# Patient Record
Sex: Female | Born: 1945 | Race: White | Hispanic: No | Marital: Married | Smoking: Never smoker
Health system: Southern US, Community
[De-identification: ages and names within clinical notes are randomized; demographics above are authoritative.]

## PROBLEM LIST (undated history)

## (undated) DIAGNOSIS — I959 Hypotension, unspecified: Secondary | ICD-10-CM

## (undated) DIAGNOSIS — R911 Solitary pulmonary nodule: Secondary | ICD-10-CM

## (undated) DIAGNOSIS — M810 Age-related osteoporosis without current pathological fracture: Secondary | ICD-10-CM

## (undated) DIAGNOSIS — M1990 Unspecified osteoarthritis, unspecified site: Secondary | ICD-10-CM

## (undated) DIAGNOSIS — I219 Acute myocardial infarction, unspecified: Secondary | ICD-10-CM

## (undated) DIAGNOSIS — E785 Hyperlipidemia, unspecified: Secondary | ICD-10-CM

## (undated) DIAGNOSIS — G4730 Sleep apnea, unspecified: Secondary | ICD-10-CM

## (undated) DIAGNOSIS — K219 Gastro-esophageal reflux disease without esophagitis: Secondary | ICD-10-CM

## (undated) DIAGNOSIS — F419 Anxiety disorder, unspecified: Secondary | ICD-10-CM

## (undated) DIAGNOSIS — I480 Paroxysmal atrial fibrillation: Secondary | ICD-10-CM

## (undated) DIAGNOSIS — G4733 Obstructive sleep apnea (adult) (pediatric): Secondary | ICD-10-CM

## (undated) DIAGNOSIS — D126 Benign neoplasm of colon, unspecified: Secondary | ICD-10-CM

## (undated) DIAGNOSIS — K760 Fatty (change of) liver, not elsewhere classified: Secondary | ICD-10-CM

## (undated) DIAGNOSIS — R001 Bradycardia, unspecified: Secondary | ICD-10-CM

## (undated) DIAGNOSIS — E876 Hypokalemia: Secondary | ICD-10-CM

## (undated) DIAGNOSIS — I10 Essential (primary) hypertension: Secondary | ICD-10-CM

## (undated) DIAGNOSIS — Z9989 Dependence on other enabling machines and devices: Secondary | ICD-10-CM

## (undated) HISTORY — PX: ROTATOR CUFF REPAIR: SHX139

## (undated) HISTORY — PX: EYE SURGERY: SHX253

## (undated) HISTORY — DX: Fatty (change of) liver, not elsewhere classified: K76.0

## (undated) HISTORY — DX: Anxiety disorder, unspecified: F41.9

## (undated) HISTORY — DX: Dependence on other enabling machines and devices: Z99.89

## (undated) HISTORY — DX: Essential (primary) hypertension: I10

## (undated) HISTORY — DX: Hyperlipidemia, unspecified: E78.5

## (undated) HISTORY — DX: Atherosclerotic heart disease of native coronary artery without angina pectoris: I25.10

## (undated) HISTORY — DX: Gastro-esophageal reflux disease without esophagitis: K21.9

## (undated) HISTORY — DX: Age-related osteoporosis without current pathological fracture: M81.0

## (undated) HISTORY — PX: COSMETIC SURGERY: SHX468

## (undated) HISTORY — PX: TONSILLECTOMY: SUR1361

## (undated) HISTORY — PX: TOTAL ABDOMINAL HYSTERECTOMY: SHX209

## (undated) HISTORY — DX: Benign neoplasm of colon, unspecified: D12.6

## (undated) HISTORY — DX: Obstructive sleep apnea (adult) (pediatric): G47.33

## (undated) HISTORY — DX: Sleep apnea, unspecified: G47.30

---

## 1998-06-05 HISTORY — PX: CORONARY STENT PLACEMENT: SHX1402

## 1998-07-25 ENCOUNTER — Emergency Department (HOSPITAL_COMMUNITY): Admission: EM | Admit: 1998-07-25 | Discharge: 1998-07-25 | Payer: Self-pay | Admitting: Internal Medicine

## 1998-07-25 ENCOUNTER — Encounter: Payer: Self-pay | Admitting: Internal Medicine

## 1998-07-25 ENCOUNTER — Inpatient Hospital Stay (HOSPITAL_COMMUNITY): Admission: EM | Admit: 1998-07-25 | Discharge: 1998-07-28 | Payer: Self-pay | Admitting: Cardiovascular Disease

## 1998-08-04 ENCOUNTER — Inpatient Hospital Stay (HOSPITAL_COMMUNITY): Admission: EM | Admit: 1998-08-04 | Discharge: 1998-08-05 | Payer: Self-pay | Admitting: Emergency Medicine

## 1998-08-04 ENCOUNTER — Encounter: Payer: Self-pay | Admitting: *Deleted

## 1998-08-14 ENCOUNTER — Encounter: Payer: Self-pay | Admitting: Emergency Medicine

## 1998-08-14 ENCOUNTER — Observation Stay (HOSPITAL_COMMUNITY): Admission: EM | Admit: 1998-08-14 | Discharge: 1998-08-15 | Payer: Self-pay | Admitting: Emergency Medicine

## 1998-08-15 ENCOUNTER — Encounter: Payer: Self-pay | Admitting: General Surgery

## 1998-11-22 ENCOUNTER — Other Ambulatory Visit: Admission: RE | Admit: 1998-11-22 | Discharge: 1998-11-22 | Payer: Self-pay | Admitting: Obstetrics and Gynecology

## 1999-05-22 ENCOUNTER — Encounter: Payer: Self-pay | Admitting: Emergency Medicine

## 1999-05-22 ENCOUNTER — Inpatient Hospital Stay (HOSPITAL_COMMUNITY): Admission: EM | Admit: 1999-05-22 | Discharge: 1999-05-23 | Payer: Self-pay | Admitting: Emergency Medicine

## 1999-05-23 ENCOUNTER — Encounter: Payer: Self-pay | Admitting: Cardiovascular Disease

## 1999-10-18 ENCOUNTER — Other Ambulatory Visit: Admission: RE | Admit: 1999-10-18 | Discharge: 1999-10-18 | Payer: Self-pay | Admitting: Obstetrics and Gynecology

## 2000-03-18 ENCOUNTER — Emergency Department (HOSPITAL_COMMUNITY): Admission: EM | Admit: 2000-03-18 | Discharge: 2000-03-18 | Payer: Self-pay | Admitting: *Deleted

## 2001-03-20 ENCOUNTER — Other Ambulatory Visit: Admission: RE | Admit: 2001-03-20 | Discharge: 2001-03-20 | Payer: Self-pay | Admitting: Obstetrics and Gynecology

## 2001-07-03 ENCOUNTER — Encounter: Payer: Self-pay | Admitting: Emergency Medicine

## 2001-07-03 ENCOUNTER — Observation Stay (HOSPITAL_COMMUNITY): Admission: EM | Admit: 2001-07-03 | Discharge: 2001-07-03 | Payer: Self-pay | Admitting: Emergency Medicine

## 2001-12-22 ENCOUNTER — Encounter: Payer: Self-pay | Admitting: *Deleted

## 2001-12-23 ENCOUNTER — Inpatient Hospital Stay (HOSPITAL_COMMUNITY): Admission: EM | Admit: 2001-12-23 | Discharge: 2001-12-24 | Payer: Self-pay | Admitting: Emergency Medicine

## 2001-12-23 ENCOUNTER — Encounter: Payer: Self-pay | Admitting: Family Medicine

## 2001-12-27 ENCOUNTER — Emergency Department (HOSPITAL_COMMUNITY): Admission: EM | Admit: 2001-12-27 | Discharge: 2001-12-27 | Payer: Self-pay | Admitting: Emergency Medicine

## 2005-06-05 DIAGNOSIS — D126 Benign neoplasm of colon, unspecified: Secondary | ICD-10-CM

## 2005-06-05 HISTORY — DX: Benign neoplasm of colon, unspecified: D12.6

## 2005-07-26 ENCOUNTER — Emergency Department (HOSPITAL_COMMUNITY): Admission: EM | Admit: 2005-07-26 | Discharge: 2005-07-26 | Payer: Self-pay | Admitting: Emergency Medicine

## 2006-01-25 ENCOUNTER — Ambulatory Visit: Payer: Self-pay | Admitting: Gastroenterology

## 2006-02-07 ENCOUNTER — Ambulatory Visit: Payer: Self-pay | Admitting: Gastroenterology

## 2006-02-07 ENCOUNTER — Encounter (INDEPENDENT_AMBULATORY_CARE_PROVIDER_SITE_OTHER): Payer: Self-pay | Admitting: *Deleted

## 2007-11-26 ENCOUNTER — Observation Stay (HOSPITAL_COMMUNITY): Admission: RE | Admit: 2007-11-26 | Discharge: 2007-11-27 | Payer: Self-pay | Admitting: Orthopedic Surgery

## 2008-10-17 ENCOUNTER — Emergency Department (HOSPITAL_COMMUNITY): Admission: EM | Admit: 2008-10-17 | Discharge: 2008-10-17 | Payer: Self-pay | Admitting: Emergency Medicine

## 2008-11-11 ENCOUNTER — Encounter: Admission: RE | Admit: 2008-11-11 | Discharge: 2008-11-11 | Payer: Self-pay | Admitting: Family Medicine

## 2009-12-27 ENCOUNTER — Encounter: Payer: Self-pay | Admitting: Internal Medicine

## 2010-02-02 ENCOUNTER — Encounter: Payer: Self-pay | Admitting: Internal Medicine

## 2010-02-02 ENCOUNTER — Ambulatory Visit (HOSPITAL_BASED_OUTPATIENT_CLINIC_OR_DEPARTMENT_OTHER): Admission: RE | Admit: 2010-02-02 | Discharge: 2010-02-02 | Payer: Self-pay | Admitting: Cardiovascular Disease

## 2010-02-06 ENCOUNTER — Ambulatory Visit: Payer: Self-pay | Admitting: Internal Medicine

## 2010-03-17 ENCOUNTER — Ambulatory Visit: Payer: Self-pay | Admitting: Internal Medicine

## 2010-03-17 DIAGNOSIS — G4733 Obstructive sleep apnea (adult) (pediatric): Secondary | ICD-10-CM

## 2010-03-19 DIAGNOSIS — I10 Essential (primary) hypertension: Secondary | ICD-10-CM | POA: Insufficient documentation

## 2010-03-19 DIAGNOSIS — E785 Hyperlipidemia, unspecified: Secondary | ICD-10-CM | POA: Insufficient documentation

## 2010-04-07 ENCOUNTER — Ambulatory Visit: Payer: Self-pay | Admitting: Cardiovascular Disease

## 2010-04-13 ENCOUNTER — Ambulatory Visit: Payer: Self-pay | Admitting: Internal Medicine

## 2010-04-13 DIAGNOSIS — G4700 Insomnia, unspecified: Secondary | ICD-10-CM

## 2010-04-24 ENCOUNTER — Encounter: Payer: Self-pay | Admitting: Internal Medicine

## 2010-05-10 ENCOUNTER — Encounter: Payer: Self-pay | Admitting: Internal Medicine

## 2010-05-10 ENCOUNTER — Ambulatory Visit: Payer: Self-pay | Admitting: Cardiovascular Disease

## 2010-06-15 ENCOUNTER — Ambulatory Visit
Admission: RE | Admit: 2010-06-15 | Discharge: 2010-06-15 | Payer: Self-pay | Source: Home / Self Care | Attending: Internal Medicine | Admitting: Internal Medicine

## 2010-06-29 ENCOUNTER — Encounter: Payer: Self-pay | Admitting: Internal Medicine

## 2010-07-07 NOTE — Assessment & Plan Note (Signed)
Summary: 1 month f/u / cj   Primary Adrina Armijo/Referring Domonic Kimball:  Herb Grays, MD  CC:  1 mth rov - pt wearing cpap 5-6 hrs/might - nasal pillows work okay - snoring has improved - c/o incrased bloating.  History of Present Illness: March 17, 2010- 64 yoF referred courtesy of Dr Elease Hashimoto because of sleep apnea. she has been aware of loud snoring with onset after menopause. Some daytime somnolence and has had to be careful on long drives. Husband is in separate room so he can sleep.  Bedtime 9-10PM, latency 30-60 minutes, wakes several times during the night, sometimes with a gsp, someties for bathroom, finally up at 630-7AM. Weight stable.  NPSG 02/02/10- Moderate obstructive apnea, AHI 16.7/hr. CPAP was titrated to 7cwp for AHI 0/hr.  Hx tonsillectomy, HTN. No hx thyroid or cardiopulmonary problem.  April 13, 2010- [facility] Nurse-CC: 1 mth rov - pt wearing cpap 5-6 hrs/might - nasal pillows work okay - snoring has improved - c/o incrased bloating Husband assures her she isn't snoring w/ cpap, but she doesn't like it. Finds CPAP hose restrictive. It wakes her alot.  CPAP 7 Advanced. Had flu vax. Doesn't like the nasal pillows in her nose, but thinks she prefers over other designs. Prone to insomnia. Discussed sleep aides- did use benadryl in past. Getting gas.     Preventive Screening-Counseling & Management  Alcohol-Tobacco     Smoking Status: never  Current Medications (verified): 1)  Atenolol 50 Mg Tabs (Atenolol) .... Take 1 By Mouth Once Daily 2)  Norvasc 5 Mg Tabs (Amlodipine Besylate) .... Take 1 By Mouth Once Daily 3)  Aspirin 81 Mg Tbec (Aspirin) .... Take 2 By Mouth Once Daily  Allergies (verified): No Known Drug Allergies  Past History:  Past Medical History: Last updated: 03/17/2010 Obstructive sleep apnea- NPSG 02/02/10- AHI 16.7/ hr (weight 139lbs) CPAP Palpitation Hyperlipidemia Hypertension  Past Surgical History: Last updated: 03/17/2010 Total Abdominal  Hysterectomy Tonsillectomy  Family History: Last updated: 03/17/2010 cancer heart disease Father- died MI age 88, snored Mother- died age 9 Alzheimers  Social History: Last updated: 04/13/2010 Married; older children ETOH-2 glasses wine daily Retired, works part time in [employer] Never Smoked  Risk Factors: Alcohol Use: 2 (03/17/2010)  Risk Factors: Smoking Status: never (04/13/2010)  Social History: Married; older children ETOH-2 glasses wine daily Retired, works part time in [employer] Never Smoked  Review of Systems      See HPI  The patient denies anorexia, fever, weight loss, weight gain, vision loss, decreased hearing, hoarseness, chest pain, syncope, dyspnea on exertion, peripheral edema, prolonged cough, headaches, hemoptysis, abdominal pain, melena, hematochezia, and severe indigestion/heartburn.    Vital Signs:  Patient profile:   65 year old female Height:      60.5 inches Weight:      144.13 pounds O2 Sat:      98 % on Room air Pulse rate:   56 / minute BP sitting:   132 / 80  (left arm) Cuff size:   regular  Vitals Entered By: Abigail Miyamoto RN (April 13, 2010 10:44 AM)  O2 Flow:  Room air  Physical Exam  Additional Exam:  General: A/Ox3; pleasant and cooperative, NAD ,medium build SKIN: no rash, lesions NODES: no lymphadenopathy HEENT: [place]/AT, EOM- WNL, Conjuctivae- clear, PERRLA, TM-WNL, Nose- clear, Throat- clear and wnl, Mallampati  III NECK: Supple w/ fair ROM, JVD- none, normal carotid impulses w/o bruits Thyroid- normal to palpation CHEST: Clear to P&A HEART: RRR, no m/g/r  heard ABDOMEN:  JYN:WGNF, nl pulses, no edema  NEURO: Grossly intact to observation       Impression & Recommendations:  Problem # 1:  OBSTRUCTIVE SLEEP APNEA (ICD-327.23)  Several comfort and acceptance issues that need to be resolved if she is to be successfull with CPOAP. I will reduce pressure from 7 to 6 to reduce gas. She can try  simethicone. Try longer hose draped down center of bed to allow more mobility Try Rx temazepam 15 mg for sleep if needed.   Medications Added to Medication List This Visit: 1)  Simethicone 80 Mg Chew (Simethicone) .... Chew 1 four times a day as needed after meals 2)  Temazepam 15 Mg Caps (Temazepam) .Marland Kitchen.. 1-2 for sleep if needed, 1/2 hour before bed. 3)  Cpap 6 Advanced   Other Orders: Est. Patient Level IV (62130) DME Referral (DME)  Patient Instructions: 1)  Please schedule a follow-up appointment in 2 months. 2)  We will have Advanced reduce CPAP to 6. Hopefully this will reduce  bloating but still prevent significiant snoring.  3)  Try simthicone for bloating 4)  Script Temazepam sleep aid Prescriptions: TEMAZEPAM 15 MG CAPS (TEMAZEPAM) 1-2 for sleep if needed, 1/2 hour before bed.  #30 x 1   Entered and Authorized by:   Waymon Budge MD   Signed by:   Waymon Budge MD on 04/13/2010   Method used:   Print then Give to Patient   RxID:   [id number] SIMETHICONE 80 MG CHEW (SIMETHICONE) Chew 1 four times a day as needed after meals  #30 x prn   Entered and Authorized by:   Waymon Budge MD   Signed by:   Waymon Budge MD on 04/13/2010   Method used:   Print then Give to Patient   RxID:   [id number]    Influenza Immunization History:    Influenza # 1:  Historical (04/06/2010)

## 2010-07-07 NOTE — Letter (Signed)
Summary: [facility]   Imported By: Lennie Odor 03/28/2010 15:26:16  _____________________________________________________________________  External Attachment:    Type:   Image     Comment:   External Document

## 2010-07-07 NOTE — Letter (Signed)
[facility]: [facility]   Imported By: Lester [place] 05/02/2010 09:37:41  _____________________________________________________________________  External Attachment:    Type:   Image     Comment:   External Document

## 2010-07-07 NOTE — Assessment & Plan Note (Signed)
Summary: 2 month return/mhh   Primary Provider/Referring Provider:  Herb Grays, MD  CC:  3 month follow up pt has been on cpap x 3 months averages 6 hrs per night.  History of Present Illness: History of Present Illness: March 17, 2010- 65 yoF referred courtesy of Dr Elease Hashimoto because of sleep apnea. she has been aware of loud snoring with onset after menopause. Some daytime somnolence and has had to be careful on long drives. Husband is in separate room so he can sleep.  Bedtime 9-10PM, latency 30-60 minutes, wakes several times during the night, sometimes with a gsp, someties for bathroom, finally up at 630-7AM. Weight stable.  NPSG 02/02/10- Moderate obstructive apnea, AHI 16.7/hr. CPAP was titrated to 7cwp for AHI 0/hr.  Hx tonsillectomy, HTN. No hx thyroid or cardiopulmonary problem.  April 13, 2010- [facility] Nurse-CC: 1 mth rov - pt wearing cpap 5-6 hrs/might - nasal pillows work okay - snoring has improved - c/o incrased bloating Husband assures her she isn't snoring w/ cpap, but she doesn't like it. Finds CPAP hose restrictive. It wakes her alot.  CPAP 7 Advanced. Had flu vax. Doesn't like the nasal pillows in her nose, but thinks she prefers over other designs. Prone to insomnia. Discussed sleep aides- did use benadryl in past. Getting gas.   June 15, 2010- [facility] Nurse-CC: 3 month follow up pt has been on cpap x 3 months averages 6 hrs per night CPAP used all night every night. One night fell asleep w/o and woke gasping and racing heart, so she is convinced it is good for her.. It does stop her snore. The CPAP does fragment her sleep. She uses temazepam only occasionally - doesn't want to have to use it.     Preventive Screening-Counseling & Management  Alcohol-Tobacco     Alcohol drinks/day: 2     Alcohol type: wine     Smoking Status: never  Current Medications (verified): 1)  Atenolol 50 Mg Tabs (Atenolol) .... Take 1 By Mouth Once Daily 2)  Norvasc 5 Mg Tabs (Amlodipine  Besylate) .... Take 1 By Mouth Once Daily 3)  Aspirin 81 Mg Tbec (Aspirin) .... Take 2 By Mouth Once Daily 4)  Simethicone 80 Mg Chew (Simethicone) .... Chew 1 Four Times A Day As Needed After Meals 5)  Temazepam 15 Mg Caps (Temazepam) .Marland Kitchen.. 1-2 For Sleep If Needed, 1/2 Hour Before Bed. 6)  Cpap 6 Advanced  Allergies (verified): No Known Drug Allergies  Past History:  Past Medical History: Last updated: 03/17/2010 Obstructive sleep apnea- NPSG 02/02/10- AHI 16.7/ hr (weight 139lbs) CPAP Palpitation Hyperlipidemia Hypertension  Past Surgical History: Last updated: 03/17/2010 Total Abdominal Hysterectomy Tonsillectomy  Family History: Last updated: 03/17/2010 cancer heart disease Father- died MI age 67, snored Mother- died age 53 Alzheimers  Social History: Last updated: 04/13/2010 Married; older children ETOH-2 glasses wine daily Retired, works part time in [employer] Never Smoked  Risk Factors: Alcohol Use: 2 (06/15/2010)  Risk Factors: Smoking Status: never (06/15/2010)  Review of Systems      See HPI  The patient denies anorexia, fever, weight loss, weight gain, vision loss, decreased hearing, hoarseness, chest pain, syncope, dyspnea on exertion, peripheral edema, prolonged cough, headaches, hemoptysis, abdominal pain, severe indigestion/heartburn, abnormal bleeding, enlarged lymph nodes, and angioedema.    Vital Signs:  Patient profile:   65 year old female Height:      61 inches Weight:      145.6 pounds O2 Sat:  97 % on Room air Pulse rate:   61 / minute BP sitting:   130 / 78  (left arm)  Vitals Entered By: Renold Genta RCP, LPN (June 15, 2010 9:26 AM)  O2 Flow:  Room air CC: 3 month follow up pt has been on cpap x 3 months averages 6 hrs per night Comments Medications reviewed with patient Renold Genta RCP, LPN  June 15, 2010 9:27 AM    Physical Exam  Additional Exam:  General: A/Ox3; pleasant and cooperative, NAD  medium  build SKIN: no rash, lesions NODES: no lymphadenopathy HEENT: [place]/AT, EOM- WNL, Conjuctivae- clear, PERRLA, TM-WNL, Nose- clear, Throat- clear and wnl, Mallampati  III NECK: Supple w/ fair ROM, JVD- none, normal carotid impulses w/o bruits Thyroid- normal to palpation CHEST: Clear to P&A HEART: RRR, no m/g/r heard ABDOMEN:  ZOX:WRUE, nl pulses, no edema  NEURO: Grossly intact to observation       Impression & Recommendations:  Problem # 1:  OBSTRUCTIVE SLEEP APNEA (ICD-327.23)  We reviewed options for management in detail again. She will continue CPAP. There will have to be improvement in her comfort if she is going to stick with it long time. CPAP comfort adjustments were reviewed. Over 20 minutes spent on this issue.  Problem # 2:  INSOMNIA (ICD-780.52) If she can get to sleeping more soundly she will see better improvement in the daytime sleepiness.  Again discussed alterntives, sleep hygiene and options.  Her updated medication list for this problem includes:    Temazepam 15 Mg Caps (Temazepam) .Marland Kitchen... 1-2 for sleep if needed, 1/2 hour before bed.  Other Orders: Est. Patient Level IV (45409)  Patient Instructions: 1)  Please schedule a follow-up appointment in 6 months. Please call sooner as needed. 2)  Continue CPAP.

## 2010-07-07 NOTE — Letter (Signed)
Summary: [facility]   Imported By: Sherian Rein 05/31/2010 10:30:58  _____________________________________________________________________  External Attachment:    Type:   Image     Comment:   External Document

## 2010-07-07 NOTE — Assessment & Plan Note (Signed)
Summary: abnormal sleep study/ [facility]   Primary Provider/Referring Provider:  Herb Grays, MD  CC:  Sleep Consult-Dr. Collier Flowers sleep study..  History of Present Illness: March 17, 2010- 65 yoF referred courtesy of Dr Elease Hashimoto because of sleep apnea. she has been aware of loud snoring with onset after menopause. Some daytime somnolence and has had to be careful on long drives. Husband is in separate room so he can sleep.  Bedtime 9-10PM, latency 30-60 minutes, wakes several times during the night, sometimes with a gqsp, someties for bathroom, finally up at 630-7AM. Weight stable.  NPSG 02/02/10- Moderate obstructive apnea, AHI 16.7/hr. CPAP was titrated to 7cwp for AHI 0/hr.  Hx tonsillectomy, HTN. No hx thyroid or cardiopulmonary problem.  Preventive Screening-Counseling & Management  Alcohol-Tobacco     Alcohol drinks/day: 2     Alcohol type: wine     Smoking Status: never  Current Medications (verified): 1)  Atenolol 50 Mg Tabs (Atenolol) .... Take 1 By Mouth Once Daily 2)  Norvasc 5 Mg Tabs (Amlodipine Besylate) .... Take 1 By Mouth Once Daily 3)  Aspirin 81 Mg Tbec (Aspirin) .... Take 2 By Mouth Once Daily  Allergies (verified): No Known Drug Allergies  Past History:  Family History: Last updated: 03/17/2010 cancer heart disease Father- died MI age 64, snored Mother- died age 47 Alzheimers  Social History: Last updated: 03/17/2010 Married; older children ETOH-2 glasses wine daily Retired, works part time in [employer]  Risk Factors: Alcohol Use: 2 (03/17/2010)  Risk Factors: Smoking Status: never (03/17/2010)  Past Medical History: Obstructive sleep apnea- NPSG 02/02/10- AHI 16.7/ hr (weight 139lbs) CPAP Palpitation Hyperlipidemia Hypertension  Past Surgical History: Total Abdominal Hysterectomy Tonsillectomy  Family History: cancer heart disease Father- died MI age 45, snored Mother- died age 43 Alzheimers  Social History: Married; older  children ETOH-2 glasses wine daily Retired, 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  The patient denies shortness of breath with activity, shortness of breath at rest, productive cough, non-productive cough, coughing up blood, chest pain, irregular heartbeats, acid heartburn, indigestion, loss of appetite, weight change, abdominal pain, difficulty swallowing, sore throat, tooth/dental problems, headaches, nasal congestion/difficulty breathing through nose, sneezing, itching, ear ache, anxiety, depression, hand/feet swelling, rash, change in color of mucus, and fever.    Vital Signs:  Patient profile:   65 year old female Height:      60.5 inches Weight:      143.25 pounds BMI:     27.62 O2 Sat:      96 % on Room air Pulse rate:   56 / minute BP sitting:   118 / 66  (left arm) Cuff size:   regular  Vitals Entered By: Reynaldo Minium CMA (March 17, 2010 3:11 PM)  O2 Flow:  Room air CC: Sleep Consult-Dr. Nasher-abnormal sleep study.   Physical Exam  Additional Exam:  General: A/Ox3; pleasant and cooperative, NAD ,medium build SKIN: no rash, lesions NODES: no lymphadenopathy HEENT: [place]/AT, EOM- WNL, Conjuctivae- clear, PERRLA, TM-WNL, Nose- clear, Throat- clear and wnl, Mallampati  III NECK: Supple w/ fair ROM, JVD- none, normal carotid impulses w/o bruits Thyroid- normal to palpation CHEST: Clear to P&A HEART: RRR, no m/g/r heard ABDOMEN: Soft and nl; nml bowel sounds; no organomegaly or masses noted, not overweight VHQ:IONG, nl pulses, no edema  NEURO: Grossly  intact to observation       Impression & Recommendations:  Problem # 1:  OBSTRUCTIVE SLEEP APNEA (ICD-327.23) Moderate obstructive sleep apnea. We discussed the physiology, medical concerns and available treatment types.  Good candidate for cpap trial and this was discussed carefully. We will  begin with the titrated pressure of 7 and adjust from there.  Driving safety and sleep hygiene were reviewed.  Medications Added to Medication List This Visit: 1)  Atenolol 50 Mg Tabs (Atenolol) .... Take 1 by mouth once daily 2)  Norvasc 5 Mg Tabs (Amlodipine besylate) .... Take 1 by mouth once daily 3)  Aspirin 81 Mg Tbec (Aspirin) .... Take 2 by mouth once daily  Other Orders: Consultation Level IV (04540) DME Referral (DME)  Patient Instructions: 1)  Please schedule a follow-up appointment in 1 month. 2)  See PCC to start CPAP. Call if it isn't comfortable.  3)  cc Dr Elease Hashimoto, Dr Collins [place]

## 2010-09-13 LAB — PROTIME-INR
INR: 0.9 (ref 0.00–1.49)
Prothrombin Time: 11.9 seconds (ref 11.6–15.2)

## 2010-09-13 LAB — URINALYSIS, ROUTINE W REFLEX MICROSCOPIC
Bilirubin Urine: NEGATIVE
Glucose, UA: NEGATIVE mg/dL
Ketones, ur: NEGATIVE mg/dL
Protein, ur: NEGATIVE mg/dL

## 2010-09-13 LAB — DIFFERENTIAL
Basophils Absolute: 0 10*3/uL (ref 0.0–0.1)
Lymphocytes Relative: 33 % (ref 12–46)
Monocytes Absolute: 0.4 10*3/uL (ref 0.1–1.0)
Monocytes Relative: 8 % (ref 3–12)
Neutro Abs: 2.6 10*3/uL (ref 1.7–7.7)

## 2010-09-13 LAB — APTT: aPTT: 29 seconds (ref 24–37)

## 2010-09-13 LAB — BASIC METABOLIC PANEL
CO2: 24 mEq/L (ref 19–32)
Chloride: 105 mEq/L (ref 96–112)
GFR calc Af Amer: >60 mL/min (ref >60)
Potassium: 3.2 mEq/L — ABNORMAL LOW (ref 3.5–5.1)
Sodium: 144 mEq/L (ref 135–145)

## 2010-09-13 LAB — CBC
Hemoglobin: 14.4 g/dL (ref 12.0–15.0)
MCHC: 34.8 g/dL (ref 30.0–36.0)
MCV: 89.3 fL (ref 78.0–100.0)
RBC: 4.65 MIL/uL (ref 3.87–5.11)

## 2010-09-13 LAB — POCT CARDIAC MARKERS
CKMB, poc: 1.1 ng/mL (ref 1.0–8.0)
Troponin i, poc: <0.05 ng/mL (ref 0.00–0.09)

## 2010-09-13 LAB — TSH: TSH: 4.309 u[IU]/mL (ref 0.350–4.500)

## 2010-09-14 ENCOUNTER — Other Ambulatory Visit: Payer: Self-pay | Admitting: Cardiovascular Disease

## 2010-09-14 DIAGNOSIS — I10 Essential (primary) hypertension: Secondary | ICD-10-CM

## 2010-09-14 NOTE — Telephone Encounter (Signed)
Patient request refill. Done, Jodette Ashayla Subia RN  

## 2010-09-14 NOTE — Telephone Encounter (Signed)
ATENOLOL 25MG . [facility] [place] PHONE NUMBER [phone]. SHE IS LEAVING [place] GOING OUT OF TOWN FOR A WEEK AND HALF  SHE IS OUT.

## 2010-10-03 ENCOUNTER — Other Ambulatory Visit: Payer: Self-pay | Admitting: Cardiovascular Disease

## 2010-10-03 DIAGNOSIS — I10 Essential (primary) hypertension: Secondary | ICD-10-CM

## 2010-10-03 MED ORDER — AMLODIPINE BESYLATE 5 MG PO TABS: 5.0000 mg | ORAL_TABLET | Freq: Every day | ORAL | Status: DC

## 2010-10-03 NOTE — Telephone Encounter (Signed)
NEEDS SCRIPT AMLODIPINE/CALL INTO [facility] [place]. CALL PT AT CELL # AND LET HER KNOW IT HAS BEEN DONE. WOULD LIKE TO HAVE 90 DAYS.

## 2010-10-03 NOTE — Telephone Encounter (Signed)
Patient request refill. Completed, pt calledJodette Mason Jim RN

## 2010-10-18 NOTE — Op Note (Signed)
NAMEAMAND, LEMOINE             ACCOUNT NO.:  [id number]   MEDICAL RECORD NO.:  [id number]          PATIENT TYPE:  OIB   LOCATION:  5031                         FACILITY:  [facility]   PHYSICIAN:  Burnard Bunting, M.D.    DATE OF BIRTH:  [birth year]   DATE OF PROCEDURE:  11/26/2007  DATE OF DISCHARGE:                               OPERATIVE REPORT   PREOPERATIVE DIAGNOSES:  Right shoulder bursitis and rotator cuff tear.   POSTOPERATIVE DIAGNOSES:  Right shoulder bursitis and rotator cuff tear.   PROCEDURE:  Right shoulder diagnostic arthroscopy, subacromial  decompression, and medial open rotator cuff repair.   SURGEON:  Burnard Bunting, MD.   ASSISTANTJerolyn Shin. Tresa Res, M.D.   ANESTHESIA:  General endotracheal.   ESTIMATED BLOOD LOSS:  Minimal.   INDICATIONS:  Shannon Cummings is a 65 year old female with full-  thickness rotator cuff tear who presents now for operative management  after failure of conservative management after explanation of risks and  benefits.   OPERATIVE FINDINGS:  1. Examination under anesthesia range of motion 0-180 forward flexion,      external rotation at 50 degrees, abduction is about 70, and      glenohumeral abduction is 100.  2. Diagnostic arthroscopy.      a.     Early capsular inflammation noted around the biceps anchor       and rotator interval.      b.     Intact glenohumeral articular surface.      c.     Full-thickness rotator cuff tear at the leading edge of the       supraspinatus.      d.     Bursitis.      e.     About 1 x 2-cm rotator cuff tear, full-thickness, in shape       of a triangle.   PROCEDURE IN DETAIL:  The patient was brought to operating room where  general endotracheal anesthesia was induced.  Preoperative antibiotics  were administered.  Time-out was called.  The patient was placed in  supine position with the head in neutral position.  Right arm, hand, and  shoulder was prepped and prescrubbed with alcohol and  Betadine which  allowed to air dry then prepped with DuraPrep solution and draped in a  sterile manner.  Collier Flowers was used to cover the axilla.   Topographical anatomy of the shoulder was then found to be at the  posterolateral and anterior margins of the acromion.  A posterior portal  was first created 2 cm medial and inferior to the posterolateral margin  of the acromion.  The scope was placed into the glenohumeral joint.  Anterior portal was then established under direct visualization with  spinal needle.  Diagnostic arthroscopy was performed.  Early reddened  inflammation was noted in the rotator interval.  This was debrided back  with the ArthroCare ablator.  The infraspinatus attachment was intact.  Supraspinatus had a tear about 1.0 x 1.5 cm from the articular side.  It  was larger when viewed from the bursal side.  Biceps anchor  was intact.  Following debridement of the early rotator interval inflammatory  synovitis, the rotator cuff tear itself was debrided.  The scope was  then placed in the subacromial space.  Lateral portal was created.  Bursectomy was performed.  The subacromial decompression was then  performed removing small hook off the anterolateral aspect of the  acromion.  Cutting-block technique was used to ensure good resection of  the hook.  CA ligament was released, but not resected.  At this time,  the scope was removed.  The anterior and posterior portals were closed  with 3-0 nylon suture.  Reprepping of the shoulder was then performed  with DuraPrep and Ioban was applied over the shoulder.  The  anterolateral portal was extended about a centimeter proximally and  distally.  Skin and subcutaneous tissues were sharply divided.  Deltoid  was split and measured distance of 3 cm from the anterolateral margin of  the acromion.  Stay suture was placed at the base of the deltoid split.  Rotator cuff tear was visualized and identified.  The triangle portion  of the tear  was also debrided.  The footprint was roughened with a rasp.  Adequate subacromial decompression was confirmed.  The rotator cuff tear  was then repaired using 3 side-to-side 2-0 FiberWire sutures to  reapproximate the longitudinal split portion of the tear.  Two  corkscrews and 2 push locks were then used to make the tear watertight  and bring it back down to the footprint.  Stable repair was achieved.  The arm was taken through a range of motion.  No crepitus or grinding  was noted.  The incision was then thoroughly irrigated.  The instruments  and self-retaining retractor were removed.  Deltoid split was closed  using 0 Vicryl suture.  Skin was closed using interrupted inverted 2-0  Vicryl suture and running 3-0 pullout Prolene.  Solution of Marcaine,  morphine, and clonidine was checked into the shoulder.  Bulky dressing  was applied with a sling.  The patient tolerated the procedure well  without immediate complications.  Dr. Lenny Pastel assistance was required  at all times during the case as for retraction and arm positioning and  assistance with medical necessity.      Burnard Bunting, M.D.  Electronically Signed     GSD/MEDQ  D:  11/26/2007  T:  11/27/2007  Job:  [id number]

## 2010-10-21 NOTE — H&P (Signed)
[place]  Patient:    Shannon Cummings, Shannon Cummings Visit Number: [id number] MRN: [id number]          Service Type: MED Location: [phone] Attending Physician:  Lonia Blood Dictated by:   Teena Irani. Arlyce Dice, M.D. Admit Date:  12/22/2001 Discharge Date: 12/24/2001                           History and Physical  DATE OF BIRTH:  1945-09-13.  CHIEF COMPLAINT:  "I have chest pain."  HISTORY OF PRESENT ILLNESS:  This woman has had intermittent pain in her chest for the last several days, but today while returning from a family trip to [place] she noted sharp, unrelenting mid-chest pain without radiation. There was no vomiting.  She did have some nausea.  The pain was persistent and required IV analgesia when she got here.  Because of her history of coronary artery disease requiring a stent, she was brought to the ER.  The initial workup showed no evidence of an acute coronary syndrome.  It was felt likely to be due to being GI in origin.  This had been considered previously, and she had been prescribed Prevacid but has taken it only very infrequently, perhaps a couple of times a month.  She is admitted for further assessment.  PAST MEDICAL HISTORY:  Significant in that she had childbirth x2, a total abdominal hysterectomy for endometriosis.  She is menopausal now, symptomatic from it, is being treated by Laqueta Linden, M.D.  MEDICATIONS:  Atenolol 25 mg a day (half of a 50), Norvasc 5 mg a day, Lipitor 40 mg, Prevacid 30, and aspirin.  ALLERGIES:  None are known.  She did have an allergic reaction eight months ago requiring an ER visit.  No cause was found.  SOCIAL HISTORY:  She is remarried, has two children who are grown.  Does not smoke.  Has an occasional alcoholic beverage.  Has no history of hereditary or familial diseases, though heart disease occurs a bit early in her family.  REVIEW OF SYSTEMS:  Noncontributory and not relevant  to the current illness.  PHYSICAL EXAMINATION:  VITAL SIGNS:  Blood pressure 161/88, pulse 61, respiratory rate 16.  GENERAL:  She is bright, articulate, alert, and in no distress at present.  HEENT:  Normocephalic.  ACs and TMs are clear.  PERRLA.  EOMs intact.  Fundi benign.  Nares unobstructed, no septal deviation.  NECK:  Supple without nodes, masses, bruits, thyroid enlargement, or tracheal deviation.  CHEST:  Chest expands symmetrically.  No wheezes, rales, or rhonchi.  CARDIAC:  In normal sinus clinically.  No murmurs, rubs, or gallops.  ABDOMEN:  Soft, no masses, guarding, tenderness, or organomegaly.  Bowel sounds are intact.  SKIN:  No lesions, though there is considerable sun exposure.  NEUROLOGIC:  Cranial nerves II-XII intact.  No gross motor or sensory deficits.  Cerebellar function is intact.  Deep tendon reflexes are present and equally brisk.  GYNECOLOGIC, BREASTS:  Deferred.  She has recently seen Dr. Katrinka Blazing and had both organ systems assessed and has had a mammogram.  IMPRESSION: 1. Menopause. 2. Childbirth x2. 3. Chest pain of uncertain cause, cardiac and/or gastrointestinal. 4. Atherosclerotic cardiovascular disease with coronary artery disease,    with stenting.  PLAN:  Admit and get both a GI and cardiac assessment. Dictated by:   Teena Irani. Arlyce Dice, M.D. Attending Physician:  Lonia Blood DD:  12/23/01 TD:  12/26/01 Job: [id number]

## 2010-11-22 ENCOUNTER — Inpatient Hospital Stay (HOSPITAL_COMMUNITY)
Admission: EM | Admit: 2010-11-22 | Discharge: 2010-11-23 | DRG: 143 | Disposition: A | Discharge status: Home / Self Care | Payer: BC Managed Care – PPO | Attending: Cardiovascular Disease | Admitting: Cardiovascular Disease

## 2010-11-22 ENCOUNTER — Emergency Department (HOSPITAL_COMMUNITY): Payer: BC Managed Care – PPO

## 2010-11-22 DIAGNOSIS — R079 Chest pain, unspecified: Secondary | ICD-10-CM

## 2010-11-22 DIAGNOSIS — Z7982 Long term (current) use of aspirin: Secondary | ICD-10-CM

## 2010-11-22 DIAGNOSIS — R42 Dizziness and giddiness: Secondary | ICD-10-CM

## 2010-11-22 DIAGNOSIS — I2510 Atherosclerotic heart disease of native coronary artery without angina pectoris: Secondary | ICD-10-CM | POA: Diagnosis present

## 2010-11-22 DIAGNOSIS — R0789 Other chest pain: Principal | ICD-10-CM | POA: Diagnosis present

## 2010-11-22 DIAGNOSIS — R55 Syncope and collapse: Secondary | ICD-10-CM | POA: Diagnosis present

## 2010-11-22 DIAGNOSIS — E785 Hyperlipidemia, unspecified: Secondary | ICD-10-CM | POA: Diagnosis present

## 2010-11-22 DIAGNOSIS — Z9861 Coronary angioplasty status: Secondary | ICD-10-CM

## 2010-11-22 DIAGNOSIS — I4891 Unspecified atrial fibrillation: Secondary | ICD-10-CM | POA: Diagnosis present

## 2010-11-22 DIAGNOSIS — I10 Essential (primary) hypertension: Secondary | ICD-10-CM | POA: Diagnosis present

## 2010-11-22 LAB — CK TOTAL AND CKMB (NOT AT ARMC)
CK, MB: 3.5 ng/mL (ref 0.3–4.0)
Relative Index: 1.6 (ref 0.0–2.5)
Total CK: 215 U/L — ABNORMAL HIGH (ref 7–177)

## 2010-11-22 LAB — CBC
MCV: 89.2 fL (ref 78.0–100.0)
Platelets: 174 10*3/uL (ref 150–400)
RBC: 4.93 MIL/uL (ref 3.87–5.11)
RDW: 13.2 % (ref 11.5–15.5)
WBC: 5 10*3/uL (ref 4.0–10.5)

## 2010-11-22 LAB — DIFFERENTIAL
Basophils Absolute: 0 10*3/uL (ref 0.0–0.1)
Basophils Relative: 0 % (ref 0–1)
Eosinophils Absolute: 0.1 10*3/uL (ref 0.0–0.7)
Eosinophils Relative: 2 % (ref 0–5)
Lymphs Abs: 2.2 10*3/uL (ref 0.7–4.0)
Neutrophils Relative %: 45 % (ref 43–77)

## 2010-11-22 LAB — BASIC METABOLIC PANEL
BUN: 14 mg/dL (ref 6–23)
Calcium: 9 mg/dL (ref 8.4–10.5)
Creatinine, Ser: 0.72 mg/dL (ref 0.50–1.10)
GFR calc Af Amer: >60 mL/min (ref >60)

## 2010-11-22 LAB — TROPONIN I: Troponin I: <0.3 ng/mL (ref <0.30)

## 2010-11-23 ENCOUNTER — Other Ambulatory Visit: Payer: Self-pay | Admitting: *Deleted

## 2010-11-23 ENCOUNTER — Encounter (INDEPENDENT_AMBULATORY_CARE_PROVIDER_SITE_OTHER): Payer: BC Managed Care – PPO | Admitting: *Deleted

## 2010-11-23 DIAGNOSIS — R079 Chest pain, unspecified: Secondary | ICD-10-CM

## 2010-11-23 LAB — HEMOGLOBIN A1C: Hgb A1c MFr Bld: 5.8 % — ABNORMAL HIGH (ref <5.7)

## 2010-11-23 LAB — LIPID PANEL
HDL: 45 mg/dL (ref >39)
Total CHOL/HDL Ratio: 5.2 RATIO
VLDL: 29 mg/dL (ref 0–40)

## 2010-11-23 LAB — TSH: TSH: 5.203 u[IU]/mL — ABNORMAL HIGH (ref 0.350–4.500)

## 2010-11-23 LAB — CARDIAC PANEL(CRET KIN+CKTOT+MB+TROPI)
Relative Index: 1.8 (ref 0.0–2.5)
Total CK: 142 U/L (ref 7–177)

## 2010-11-23 NOTE — Progress Notes (Signed)
King of heart monitor placed/30dy

## 2010-11-24 ENCOUNTER — Encounter: Payer: Self-pay | Admitting: *Deleted

## 2010-11-29 ENCOUNTER — Telehealth: Payer: Self-pay | Admitting: Physician Assistant

## 2010-11-29 ENCOUNTER — Ambulatory Visit (HOSPITAL_COMMUNITY): Payer: BC Managed Care – PPO | Attending: Cardiovascular Disease | Admitting: Radiology

## 2010-11-29 ENCOUNTER — Telehealth: Payer: Self-pay | Admitting: Cardiovascular Disease

## 2010-11-29 DIAGNOSIS — R0789 Other chest pain: Secondary | ICD-10-CM

## 2010-11-29 DIAGNOSIS — I10 Essential (primary) hypertension: Secondary | ICD-10-CM | POA: Insufficient documentation

## 2010-11-29 DIAGNOSIS — R079 Chest pain, unspecified: Secondary | ICD-10-CM | POA: Insufficient documentation

## 2010-11-29 DIAGNOSIS — R0602 Shortness of breath: Secondary | ICD-10-CM

## 2010-11-29 DIAGNOSIS — G4733 Obstructive sleep apnea (adult) (pediatric): Secondary | ICD-10-CM | POA: Insufficient documentation

## 2010-11-29 DIAGNOSIS — E785 Hyperlipidemia, unspecified: Secondary | ICD-10-CM | POA: Insufficient documentation

## 2010-11-29 DIAGNOSIS — I2510 Atherosclerotic heart disease of native coronary artery without angina pectoris: Secondary | ICD-10-CM

## 2010-11-29 MED ORDER — TECHNETIUM TC 99M TETROFOSMIN IV KIT
11.0000 | PACK | Freq: Once | INTRAVENOUS | Status: AC | PRN
  Administered 2010-11-29: 11 via INTRAVENOUS

## 2010-11-29 MED ORDER — TECHNETIUM TC 99M TETROFOSMIN IV KIT
33.0000 | PACK | Freq: Once | INTRAVENOUS | Status: AC | PRN
  Administered 2010-11-29: 33 via INTRAVENOUS

## 2010-11-29 NOTE — Telephone Encounter (Signed)
Received answering service page this evening from [facility] from [employer] monitoring company re: Ms. Barcia. The patient was noted around 4pm to have sustained atrial fib with HR ranging from 170 up to as high as 210 bpm. Pt was just seen in the office today for a stress test. Notified Dr. Elease Hashimoto of results who called the patient to discuss plan. Pt had been taking her propafenone only once a day which was known at last OV, but she had been doing well on this dose so no changes were made at that visit. Dr. Elease Hashimoto advised her to take one tablet of propafenone now (at time of phone conversation) and see how she feels, and if no relief to proceed to ER. She expressed understanding.

## 2010-11-29 NOTE — Telephone Encounter (Signed)
Fax: [phone] last 05 NUC

## 2010-11-29 NOTE — Progress Notes (Signed)
[facility] SITE 3 NUCLEAR MED [address] [place] [address] [phone]  Cardiology Nuclear Med Study  Shannon Cummings is a 65 y.o. female [id number] 07-11-1945   Nuclear Med Background Indication for Stress Test:  Evaluation for Ischemia, Stent Patency and 11/23/10 Post Hospital; CP > R/O MI History: 00 Heart Catheterization:obst. LAD and stented,05 Myocardial Perfusion Study: and Stents Cardiac Risk Factors: Family History - CAD, Hypertension and Lipids  Symptoms:  Chest Pain, Chest Pressure, Chest Tightness (last date of chest discomfort 11/23/10 ), Dizziness, Fatigue, Nausea, Near Syncope, Palpitations and SOB   Nuclear Pre-Procedure Caffeine/Decaff Intake:  None NPO After: 7:00pm   Lungs:  clear IV 0.9% NS with Angio Cath:  18g  IV Site: R Antecubital  IV Started by:  Stanton Kidney, EMT-P  Chest Size (in):  34 Cup Size: DD  Height: 5\' 1"  (1.549 m)  Weight:  140 lb (63.504 kg)  BMI:  Body mass index is 26.45 kg/(m^2). Tech Comments:  Atenolol held > 48 hours, per patient.    Nuclear Med Study 1 or 2 day study: 1 day  Stress Test Type:  Stress  Reading MD: Kristeen Miss, MD  Order Authorizing Provider:  Jannette Spanner  Resting Radionuclide: Technetium 56m Tetrofosmin  Resting Radionuclide Dose: 11 mCi   Stress Radionuclide:  Technetium 45m Tetrofosmin  Stress Radionuclide Dose: 33 mCi           Stress Protocol Rest HR: 63 Stress HR: 142  Rest BP: 135/82 Stress BP: 181/85  Exercise Time (min): 9:31 METS: 10.9   Predicted Max HR: 156 bpm % Max HR: 91.03 bpm Rate Pressure Product: 84132   Dose of Adenosine (mg):  n/a Dose of Lexiscan: n/a mg  Dose of Atropine (mg): n/a Dose of Dobutamine: n/a mcg/kg/min (at max HR)  Stress Test Technologist: Cathlyn Parsons, RN  Nuclear Technologist:  Doyne Keel, CNMT     Rest Procedure:  Myocardial perfusion imaging was performed at rest 45 minutes following the intravenous administration of Technetium  74m Tetrofosmin. Rest ECG: NSR  Stress Procedure:  The patient exercised for 9:31.  The patient stopped due to fatigue and SOB. Patient had chest tightness 3/10 in recovery which was relieved later in recovery. There were no significant ST-T wave changes. No ectopy.  Technetium 48m Tetrofosmin was injected at peak exercise and myocardial perfusion imaging was performed after a brief delay. Stress ECG: No significant change from baseline ECG  QPS Raw Data Images:  Normal; no motion artifact; normal heart/lung ratio. Stress Images:  Normal homogeneous uptake in all areas of the myocardium. Rest Images:  Normal homogeneous uptake in all areas of the myocardium. Subtraction (SDS):  No evidence of ischemia. Transient Ischemic Dilatation (Normal <1.22):  .95  Lung/Heart Ratio (Normal <0.45):  .35   Quantitative Gated Spect Images QGS EDV:  56 ml QGS ESV:  15 ml QGS cine images:  NL LV Function; NL Wall Motion QGS EF: 74%  Impression Exercise Capacity:  Excellent exercise capacity. BP Response:  Normal blood pressure response. Clinical Symptoms:  No chest pain. ECG Impression:  No significant ST segment change suggestive of ischemia. Comparison with Prior Nuclear Study: No significant change from previous study  Overall Impression:  Normal stress nuclear study.  No evidence of ischemia. Normal LV function.   [name]., MD, [facility]

## 2010-11-30 ENCOUNTER — Telehealth: Payer: Self-pay | Admitting: *Deleted

## 2010-11-30 NOTE — Telephone Encounter (Signed)
Pt called msg left, see if she is feeling better and to see if she is taking her rythmol 150mg  bid. May need app if not feeling better/ arrhythmia. Dr Ian Bushman spoke with pt yesterday.

## 2010-11-30 NOTE — Progress Notes (Addendum)
Nuc med study routed to Dr Elease Hashimoto 11/30/10 Shannon Cummings  Have discussed the results with patient - Normal stress Myoview.  [name].

## 2010-11-30 NOTE — Telephone Encounter (Signed)
Answered cell, pt feeling better now. Pt will take rhythmol 150mg  bid, c/o patches from event monitor causing skin irritation, pt to call [employer] to ask them for hypoallergenic pads, pt wants to remove monitor since has had lexiscan that was normal. Per dr Ian Bushman pt to continue to wear monitor, Pt verbalized understanding. Alfonso Ramus RN

## 2010-12-04 NOTE — H&P (Signed)
Shannon Cummings, Shannon Cummings NO.:  [id number]  MEDICAL RECORD NO.:  [id number]  LOCATION:  MAJO                         FACILITY:  [facility]  PHYSICIAN:  Wendi Snipes, MD DATE OF BIRTH:  28-Dec-1945  DATE OF ADMISSION:  11/22/2010 DATE OF DISCHARGE:                             HISTORY & PHYSICAL   CARDIOLOGIST:  Vesta Mixer, MD  PRIMARY CARE PHYSICIAN:  Tammy R. Collins Scotland, MD  CHIEF COMPLAINTS:  Chest pain and dizziness.  HISTORY OF PRESENT ILLNESS:  This is a 65 year old white female with a history of coronary artery disease status post stenting in 2000 who presents here after the onset of episodes of chest pain and dizziness. She states that it was started this afternoon and she felt briefly as though she was going to pass out.  This was also associated with sharp left-sided chest wall pain that occurred briefly, though it was associated with intermittent chest pressure.  She states otherwise that she has been feeling not well for the past 2 weeks, though denies recent exertional angina, palpitations, or increased lower extremity edema. She also reports vague flushing feeling along with these episodes that do not feel like hot flashes particularly.  PAST MEDICAL HISTORY: 1. Coronary disease status post PCI in 2000. 2. Hypertension. 3. Paroxysmal atrial fibrillation. 4. Hyperlipidemia.  ALLERGIES:  She has a STATIN intolerance by her report.  MEDICATIONS ON ADMISSION: 1. Norvasc 5 mg daily. 2. Aspirin 81 mg daily. 3. Propafenone 150 mg twice daily. 4. Atenolol 25 mg daily.  SOCIAL HISTORY:  She lives in [place] with her husband.  She works part-time at an [employer].  She does not smoke.  FAMILY HISTORY:  Her father died of myocardial infarction at age 105.  REVIEW OF SYSTEMS:  All 14 systems were reviewed and were negative except as mentioned in detail in HPI.  PHYSICAL EXAMINATION:  VITAL SIGNS:  Blood pressure 118/69, respiratory rate 16,  pulse is 59, and she is saturating 97% on room air. GENERAL:  She is a 65 year old white female appearing stated age in no acute distress. HEENT:  Moist mucous membranes.  Pupils equal, round, and react to light and accommodation.  Anicteric sclerae. NECK:  No jugular venous distention.  No thyromegaly. CARDIOVASCULAR:  Regular rate and rhythm.  No murmurs, rubs, or gallops. LUNGS:  Clear to auscultation bilaterally. ABDOMEN:  Nontender and nondistended.  Positive bowel sounds.  No masses. EXTREMITIES:  No clubbing, cyanosis, or edema. NEUROLOGIC:  Alert and oriented x3.  Cranial nerves II-XII grossly intact.  No focal neurologic deficits. PSYCHIATRIC:  Mood and affect are appropriate. SKIN:  Warm, dry, and intact.  No rashes.  RADIOLOGIC DATA:  Chest x-ray showed no acute cardiopulmonary process. EKG showed normal sinus rhythm with a rate of 62 beats per minute.  No ST or T-wave abnormalities.  Normal EKG.  LABORATORY REVIEW:  White cell count 5.0, hematocrit 44, potassium is 3.5, and creatinine 0.72.  CK-MB is 3.5.  Troponin is less than 0.3.  ASSESSMENT AND PLAN:  This is a 65 year old white female with a history of coronary disease status post percutaneous coronary intervention here with dizziness, chest pain, and flushing. 1. Chest pain.  This is atypical and unlikely represents an active     coronary process.  She is mostly complaining of dizziness at this     time which has a rotational component.  There is no current     objective evidence of ischemia and we will admit to monitor     closely.  This is potentially a viral syndrome though her     complaints were rather vague though persistent and even has     occurred a few times in the emergency department.  We discussed     outpatient followup though she did continue to have episodes. 2. Hypertension.  We will continue her current medications and monitor     her carefully. 3. Hyperlipidemia.  We will check a fasting lipid  profile.     Wendi Snipes, MD     [facility]/MEDQ  D:  11/22/2010  T:  11/23/2010  Job:  [id number]  Electronically Signed by Jim Desanctis MD on 12/04/2010 10:07:39 AM

## 2010-12-05 ENCOUNTER — Encounter: Payer: Self-pay | Admitting: Cardiovascular Disease

## 2010-12-13 ENCOUNTER — Other Ambulatory Visit: Payer: Self-pay | Admitting: *Deleted

## 2010-12-13 DIAGNOSIS — E785 Hyperlipidemia, unspecified: Secondary | ICD-10-CM

## 2010-12-22 ENCOUNTER — Other Ambulatory Visit: Payer: Self-pay | Admitting: Cardiovascular Disease

## 2010-12-22 ENCOUNTER — Encounter: Payer: Self-pay | Admitting: Cardiovascular Disease

## 2010-12-22 ENCOUNTER — Ambulatory Visit (INDEPENDENT_AMBULATORY_CARE_PROVIDER_SITE_OTHER): Payer: BC Managed Care – PPO | Admitting: Cardiovascular Disease

## 2010-12-22 ENCOUNTER — Other Ambulatory Visit (INDEPENDENT_AMBULATORY_CARE_PROVIDER_SITE_OTHER): Payer: BC Managed Care – PPO | Admitting: *Deleted

## 2010-12-22 VITALS — BP 122/88 | HR 56 | Ht 61.5 in | Wt 142.4 lb

## 2010-12-22 DIAGNOSIS — I4891 Unspecified atrial fibrillation: Secondary | ICD-10-CM

## 2010-12-22 DIAGNOSIS — I10 Essential (primary) hypertension: Secondary | ICD-10-CM

## 2010-12-22 DIAGNOSIS — R072 Precordial pain: Secondary | ICD-10-CM

## 2010-12-22 DIAGNOSIS — I480 Paroxysmal atrial fibrillation: Secondary | ICD-10-CM | POA: Insufficient documentation

## 2010-12-22 DIAGNOSIS — E785 Hyperlipidemia, unspecified: Secondary | ICD-10-CM

## 2010-12-22 DIAGNOSIS — I2510 Atherosclerotic heart disease of native coronary artery without angina pectoris: Secondary | ICD-10-CM

## 2010-12-22 LAB — HEPATIC FUNCTION PANEL
ALT: 27 U/L (ref 0–35)
Albumin: 4.5 g/dL (ref 3.5–5.2)
Bilirubin, Direct: 0.1 mg/dL (ref 0.0–0.3)
Total Protein: 7.6 g/dL (ref 6.0–8.3)

## 2010-12-22 LAB — BASIC METABOLIC PANEL
BUN: 17 mg/dL (ref 6–23)
CO2: 29 mEq/L (ref 19–32)
Calcium: 8.7 mg/dL (ref 8.4–10.5)
Creatinine, Ser: 0.9 mg/dL (ref 0.4–1.2)
Glucose, Bld: 95 mg/dL (ref 70–99)

## 2010-12-22 LAB — LIPID PANEL
Cholesterol: 272 mg/dL — ABNORMAL HIGH (ref 0–200)
HDL: 54.4 mg/dL (ref >39.00)
Triglycerides: 114 mg/dL (ref 0.0–149.0)

## 2010-12-22 MED ORDER — AMLODIPINE BESYLATE 5 MG PO TABS: 5.0000 mg | ORAL_TABLET | Freq: Every day | ORAL | Status: DC

## 2010-12-22 MED ORDER — ATENOLOL 25 MG PO TABS: 25.0000 mg | ORAL_TABLET | Freq: Every day | ORAL | Status: DC

## 2010-12-22 NOTE — Progress Notes (Signed)
Shannon Cummings Date of Birth  07/13/1945 [facility] [address]     [address] [place], [place]  [address] [phone]  Fax  [phone]  History of Present Illness:  Pt is doing well.  No cardiac complaints.  Upset by her hospital bill.    She complains of some tingling and numbness associated with the Crestor.  She has discontinued the Crestor because of that reason.  She has had reactions to most statins that we have tried.  Current Outpatient Prescriptions on File Prior to Visit  Medication Sig Dispense Refill  . amLODipine (NORVASC) 5 MG tablet Take 1 tablet (5 mg total) by mouth daily.  90 tablet  3  . atenolol (TENORMIN) 25 MG tablet TAKE 1 TABLET BY MOUTH EVERY DAY  30 tablet  10  . propafenone (RYTHMOL) 150 MG tablet Take 1 tablet (150 mg total) by mouth 2 (two) times daily.  90 tablet  11    Allergies  Allergen Reactions  . Statins     No past medical history on file.  No past surgical history on file.  History  Smoking status  . Never Smoker   Smokeless tobacco  . Not on file    History  Alcohol Use  . Yes    No family history on file.  Reviw of Systems:  Reviewed in the HPI.  All other systems are negative.  Physical Exam: BP 122/88  Pulse 56  Ht 5' 1.5" (1.562 m)  Wt 142 lb 6.4 oz (64.592 kg)  BMI 26.47 kg/m2 The patient is alert and oriented x 3.  The mood and affect are normal.   Skin: warm and dry.  Color is normal.    HEENT:   the sclera are nonicteric.  The mucous membranes are moist.  The carotids are 2+ without bruits.  There is no thyromegaly.  There is no JVD.    Lungs: clear.  The chest wall is non tender.    Heart: regular rate with a normal S1 and S2.  There are no murmurs, gallops, or rubs. The PMI is not displaced.     Abdomin: good bowel sounds.  There is no guarding or rebound.  There is no hepatosplenomegaly or tenderness.  There are no masses.   Extremities:  no clubbing,  cyanosis, or edema.  The legs are without rashes.  The distal pulses are intact.   Neuro:  Cranial nerves II - XII are intact.  Motor and sensory functions are intact.    The gait is normal.  Assessment / Plan:

## 2010-12-22 NOTE — Assessment & Plan Note (Signed)
She continues on low dose Propafanone. She has maintained sinus rhythm. We'll continue to watch her for signs of ischemia. If she has any recurrent ischemia, we will need to discontinue the Propafanone.

## 2010-12-22 NOTE — Assessment & Plan Note (Signed)
She was recently admitted to the hospital with an atypical episode of chest pain. She ruled out for myocardial infarction. She had a stress Myoview study which was negative for ischemia. She's not had any further episodes of chest pain. We'll continue with her same medications. I'll see her again in 6 months. Will check lipids, basic metabolic profile, and hepatic profile at that time.

## 2010-12-22 NOTE — Discharge Summary (Signed)
  NAMESHUKRI, NISTLER             ACCOUNT NO.:  [id number]  MEDICAL RECORD NO.:  [id number]  LOCATION:  2013                         FACILITY:  [facility]  PHYSICIAN:  Vesta Mixer, M.D. DATE OF BIRTH:  June 29, 1945  DATE OF ADMISSION:  11/22/2010 DATE OF DISCHARGE:  11/23/2010                              DISCHARGE SUMMARY   PRIMARY CARDIOLOGIST:  Vesta Mixer, MD  PRIMARY CARE PHYSICIAN:  Tammy R. Collins Scotland, MD  DISCHARGE DIAGNOSIS:  Chest pain without objective evidence of ischemia.  SECONDARY DIAGNOSES: 1. Presyncope. 2. Hypertension. 3. History of paroxysmal atrial fibrillation. 4. Hyperlipidemia. 5. Coronary artery disease, status post percutaneous intervention in     2000.  ALLERGIES:  She is intolerant of STATINS.  PROCEDURES:  None.  HISTORY OF PRESENT ILLNESS:  This is a 65 year old female with prior history of CAD status post prior stenting in 2000 who was in her usual state of health until the afternoon of November 22, 2010, when she had a brief episode of presyncope without syncope.  This was also associated with left-sided sharp chest wall pain.  This was followed by intermittent chest pressure prompting her present to [facility] ED where ECG showed no acute changes and point-of-care markers were negative. She was admitted for further evaluation.  HOSPITAL COURSE:  The patient ruled out for MI.  She has had no further episodes of presyncope or chest pain and has had no events on the telemetry monitor.  She will be discharged home today in good condition and will pick up an event monitor at the [facility] office today at 2 p.m.  We have arranged for an outpatient exercise Myoview on November 29, 2010.  DISCHARGE LABORATORY DATA:  Hemoglobin 15.4, hematocrit 44.0, WBC 5.0, and platelets 174.  Sodium 140, potassium 3.5, chloride 101, CO2 of 26, BUN 14, creatinine 0.72, and glucose 115.  CK 142, MB 2.5, and troponin I less than 0.30.  Total cholesterol  236, triglycerides 147, HDL 45, and LDL 162.  DISPOSITION:  The patient will be discharged home today in good condition.  FOLLOWUP PLANS AND APPOINTMENTS:  The patient will pick up an event monitor today at [facility] at 2 p.m.  She will undergo an exercise Myoview on November 29, 2010, at 9:45 a.m. at [facility]. She will follow up with Dr. Kristeen Miss on December 22, 2010, at 9:15 a.m.  DISCHARGE MEDICATIONS: 1. Nitroglycerin 0.4 mg sublingual p.r.n. chest pain. 2. Amlodipine 5 mg nightly. 3. Aspirin 325 mg nightly. 4. Atenolol 25 mg nightly., 5. Propafenone 150 mg nightly. 6. Vitamin D3 one tablet over-the-counter nightly.  OUTSTANDING LABORATORY DATA AND STUDIES:  Followup event monitor and Myoview.  DURATION OF DISCHARGE ENCOUNTER:  35 minutes including physician time.     Nicolasa Ducking, ANP   ______________________________ Vesta Mixer, M.D.    CB/MEDQ  D:  11/23/2010  T:  11/24/2010  Job:  [id number]  cc:   Tammy R. Collins Scotland, M.D.  Electronically Signed by Nicolasa Ducking ANP on 11/25/2010 02:33:51 PM Electronically Signed by Kristeen Miss M.D. on 12/22/2010 02:44:14 PM

## 2010-12-26 ENCOUNTER — Telehealth: Payer: Self-pay | Admitting: *Deleted

## 2010-12-26 ENCOUNTER — Encounter: Payer: Self-pay | Admitting: Cardiovascular Disease

## 2010-12-26 NOTE — Telephone Encounter (Signed)
Patient called with lab results. MSG left to call for results and to possibly start zetia.Alfonso Ramus RN

## 2010-12-27 ENCOUNTER — Telehealth: Payer: Self-pay | Admitting: *Deleted

## 2010-12-27 DIAGNOSIS — I10 Essential (primary) hypertension: Secondary | ICD-10-CM

## 2010-12-27 MED ORDER — AMLODIPINE BESYLATE 5 MG PO TABS: 5.0000 mg | ORAL_TABLET | Freq: Every day | ORAL | Status: DC

## 2010-12-27 MED ORDER — ATENOLOL 25 MG PO TABS: 25.0000 mg | ORAL_TABLET | Freq: Every day | ORAL | Status: DC

## 2010-12-27 NOTE — Telephone Encounter (Signed)
Pt given chol results and dr Ian Bushman wanted her to try zetia, pt has leg ache with all statins and has tried all. Asked if she would try taking it 3 times a week to see if she could tolerate it. Pt will consider it and get back with [place] if she wants to try again. Alfonso Ramus RN

## 2010-12-28 ENCOUNTER — Ambulatory Visit (INDEPENDENT_AMBULATORY_CARE_PROVIDER_SITE_OTHER): Payer: BC Managed Care – PPO | Admitting: Internal Medicine

## 2010-12-28 ENCOUNTER — Encounter: Payer: Self-pay | Admitting: Internal Medicine

## 2010-12-28 VITALS — BP 122/64 | HR 50 | Ht 61.0 in | Wt 145.2 lb

## 2010-12-28 DIAGNOSIS — G4733 Obstructive sleep apnea (adult) (pediatric): Secondary | ICD-10-CM

## 2010-12-28 DIAGNOSIS — G4700 Insomnia, unspecified: Secondary | ICD-10-CM

## 2010-12-28 MED ORDER — TEMAZEPAM 15 MG PO CAPS: 15.0000 mg | ORAL_CAPSULE | Freq: Every evening | ORAL | Status: DC | PRN

## 2010-12-28 NOTE — Assessment & Plan Note (Signed)
Good compliance and control at 6 cwp, to continue

## 2010-12-28 NOTE — Assessment & Plan Note (Signed)
Discussed temazepam for appropriate use- will refill

## 2010-12-28 NOTE — Patient Instructions (Signed)
continue CPAP at 6  Temazepam refilled

## 2010-12-28 NOTE — Progress Notes (Signed)
Subjective:    Patient ID: Shannon Cummings, female    DOB: 11/02/1945, 65 y.o.   MRN: [id number]  HPI 12/28/10- 80 yoF never smoker followed for Sleep apnea, complicated by HBP, CAD, AFib Last here June 15, 2010 Continues very compliant with CPAP at 6, used all night every night. Advanced changed to to a nasal pillows design that loops around ears.  She does find the temazepam works very well for rare use. CPAP mask leaks do wake her occasionally .  Review of Systems Constitutional:   No-   weight loss, night sweats, fevers, chills, fatigue, lassitude. HEENT:   No-   headaches, difficulty swallowing, tooth/dental problems, sore throat,                  No-   sneezing, itching, ear ache, nasal congestion, post nasal drip,   CV:  No-   chest pain, orthopnea, PND, swelling in lower extremities, anasarca, dizziness, palpitations  GI:  No-   heartburn, indigestion, abdominal pain, nausea, vomiting, diarrhea,                 change in bowel habits, loss of appetite  Resp: No-   shortness of breath with exertion or at rest.  No-  excess mucus,             No-   productive cough,  No non-productive cough,  No-  coughing up of blood.              No-   change in color of mucus.  No- wheezing.    Skin: No-   rash or lesions.  GU: No-   dysuria, change in color of urine, no urgency or frequency.  No- flank pain.  MS:  No-   joint pain or swelling.  No- decreased range of motion.  No- back pain.  Psych:  No- change in mood or affect. No depression or anxiety.  No memory loss.      Objective:   Physical Exam General- Alert, Oriented, Affect-appropriate, Distress- none acute Skin- rash-none, lesions- none, excoriation- none Lymphadenopathy- none Head- atraumatic            Eyes- Gross vision intact, PERRLA, conjunctivae clear secretions            Ears- Hearing, canals            Nose- Clear, no-Septal dev, mucus, polyps, erosion, perforation             Throat- Mallampati II-III ,  mucosa clear , drainage- none, tonsils- atrophic Neck- flexible , trachea midline, no stridor , thyroid nl, carotid no bruit Chest - symmetrical excursion , unlabored           Heart/CV- RRR , no murmur , no gallop  , no rub, nl s1 s2                   Sinus rhythm today                           - JVD- none , edema- none, stasis changes- none, varices- none           Lung- clear to P&A, wheeze- none, cough- none , dullness-none, rub- none           Chest wall-  Abd- tender-no, distended-no, bowel sounds-present, HSM- no Br/ Gen/ Rectal- Not done, not indicated Extrem- cyanosis- none, clubbing, none, atrophy- none, strength- nl Neuro- grossly  intact to observation         Assessment & Plan:

## 2011-01-20 ENCOUNTER — Encounter: Payer: Self-pay | Admitting: *Deleted

## 2011-03-02 LAB — CBC
HCT: 42.8
Hemoglobin: 14.8
MCHC: 34.5
MCV: 89.2
Platelets: 183
RDW: 13.5

## 2011-03-02 LAB — COMPREHENSIVE METABOLIC PANEL
Albumin: 4.6
Alkaline Phosphatase: 72
BUN: 9
Creatinine, Ser: 0.83
Glucose, Bld: 89
Total Bilirubin: 0.8
Total Protein: 7

## 2011-03-10 ENCOUNTER — Encounter: Payer: Self-pay | Admitting: Gastroenterology

## 2011-06-02 ENCOUNTER — Other Ambulatory Visit: Payer: Self-pay | Admitting: *Deleted

## 2011-06-02 MED ORDER — PROPAFENONE HCL 150 MG PO TABS: 150.0000 mg | ORAL_TABLET | Freq: Two times a day (BID) | ORAL | Status: DC

## 2011-06-07 ENCOUNTER — Other Ambulatory Visit: Payer: Self-pay | Admitting: *Deleted

## 2011-06-07 NOTE — Telephone Encounter (Signed)
Opened in Error.

## 2011-10-24 ENCOUNTER — Encounter: Payer: Self-pay | Admitting: Gastroenterology

## 2011-11-22 ENCOUNTER — Ambulatory Visit: Payer: BC Managed Care – PPO | Admitting: Gastroenterology

## 2011-11-24 ENCOUNTER — Telehealth: Payer: Self-pay | Admitting: Internal Medicine

## 2011-11-24 NOTE — Telephone Encounter (Signed)
Spoke with pt. She states she had been having some GI issues and so she had a CT Abd done which showed changes in her lungs. She states that she has also noticed slight increase in her SOB recently. She is concerned and wants CDY to look at films and advise recs. She has appt in August, but CDY had opening of 11-28-11 @ 1:45 so I scheduled her to see him then so they can discuss. Pt states will bring report and disc with her to this appt. Nothing further needed.

## 2011-11-24 NOTE — Telephone Encounter (Signed)
Patient returning call.  Shannon Cummings  °

## 2011-11-24 NOTE — Telephone Encounter (Signed)
LMTCB x 1 

## 2011-11-24 NOTE — Telephone Encounter (Signed)
ATC line busy x 3 wcb 

## 2011-11-28 ENCOUNTER — Ambulatory Visit (INDEPENDENT_AMBULATORY_CARE_PROVIDER_SITE_OTHER): Payer: Medicare Other | Admitting: Internal Medicine

## 2011-11-28 ENCOUNTER — Encounter: Payer: Self-pay | Admitting: Internal Medicine

## 2011-11-28 VITALS — BP 122/80 | HR 53 | Ht 61.0 in | Wt 141.6 lb

## 2011-11-28 DIAGNOSIS — K760 Fatty (change of) liver, not elsewhere classified: Secondary | ICD-10-CM

## 2011-11-28 DIAGNOSIS — G4700 Insomnia, unspecified: Secondary | ICD-10-CM

## 2011-11-28 DIAGNOSIS — K7689 Other specified diseases of liver: Secondary | ICD-10-CM

## 2011-11-28 DIAGNOSIS — G4733 Obstructive sleep apnea (adult) (pediatric): Secondary | ICD-10-CM

## 2011-11-28 MED ORDER — TEMAZEPAM 15 MG PO CAPS: 15.0000 mg | ORAL_CAPSULE | Freq: Every evening | ORAL | Status: DC | PRN

## 2011-11-28 NOTE — Patient Instructions (Addendum)
Continue CPAP at 6  Ask Advanced how to clean your humidifier  I agree that it would be good to discuss your recent CT with Dr Russella Dar  Script to refill temazepam  Please call as needed

## 2011-11-28 NOTE — Progress Notes (Signed)
Subjective:    Patient ID: Shannon Cummings, female    DOB: [birth year], 66 y.o.   MRN: [id number]  HPI 12/28/10- 66 yoF never smoker followed for Sleep apnea, complicated by HBP, CAD, AFib Last here June 15, 2010 Continues very compliant with CPAP at 6, used all night every night. Advanced changed to to a nasal pillows design that loops around ears.  She does find the temazepam works very well for rare use. CPAP mask leaks do wake her occasionally .  11/28/11- 66 yoF never smoker followed for Sleep apnea, complicated by HBP, CAD, AFib Wears CPAP 6/Advanced every night about 6-7 hours at night; hard to get used to hose and items while sleeping; Temazepam still working well and needs RX- used about once per week. She needs help understanding how to clean her humidifier reservoir and tried sleeping last night without it because it seems dirty. I directed her to Advanced. She has noted a little wheeze occasionally, and need to sigh. She thinks she might be a little more short of breath while talking. Denies cough wheeze chest pain or palpitation. She brings the report of CT abdomen done at [facility] on 11/06/2011. This mentioned some atelectasis in the lung bases while focusing on hepatic steatosis and atherosclerotic vascular changes. We discussed atelectasis and reviewed most recent chest x-ray in our system. CXR 11/22/10-  IMPRESSION:  1. No acute cardiopulmonary abnormalities.  Original Report Authenticated By: Rosealee Albee, M.D.   ROS-see HPI Constitutional:   No-   weight loss, night sweats, fevers, chills, fatigue, lassitude. HEENT:   No-  headaches, difficulty swallowing, tooth/dental problems, sore throat,       No-  sneezing, itching, ear ache, nasal congestion, post nasal drip,  CV:  No-   chest pain, orthopnea, PND, swelling in lower extremities, anasarca, dizziness, palpitations Resp: +  shortness of breath with exertion or at rest.              No-   productive cough,  No  non-productive cough,  No- coughing up of blood.              No-   change in color of mucus.  + wheezing.   Skin: No-   rash or lesions. GI:  No-   heartburn, indigestion, abdominal pain, nausea, vomiting,  GU:  MS:  No-   joint pain or swelling.  Neuro-     nothing unusual Psych:  No- change in mood or affect. No depression or anxiety.  No memory loss.  Objective:   Physical Exam General- Alert, Oriented, Affect-appropriate, Distress- none acute, talkative Skin- rash-none, lesions- none, excoriation- none Lymphadenopathy- none Head- atraumatic            Eyes- Gross vision intact, PERRLA, conjunctivae clear secretions            Ears- Hearing, canals            Nose- Clear, no-Septal dev, mucus, polyps, erosion, perforation             Throat- Mallampati II-III , mucosa clear , drainage- none, tonsils- atrophic Neck- flexible , trachea midline, no stridor , thyroid nl, carotid no bruit Chest - symmetrical excursion , unlabored           Heart/CV- RRR- clinically she is in sinus rhythm , no murmur , no gallop  , no rub, nl s1 s2  y                           - JVD- none , edema- none, stasis changes- none, varices- none           Lung- clear to P&A, wheeze- none, cough- none , dullness-none, rub- none           Chest wall-  Abd-  Br/ Gen/ Rectal- Not done, not indicated Extrem- cyanosis- none, clubbing, none, atrophy- none, strength- nl Neuro- grossly intact to observation         Assessment & Plan:

## 2011-12-03 DIAGNOSIS — K760 Fatty (change of) liver, not elsewhere classified: Secondary | ICD-10-CM | POA: Insufficient documentation

## 2011-12-03 NOTE — Assessment & Plan Note (Signed)
Occasional to [facility] works well and can be refilled. Sleep hygiene reviewed.

## 2011-12-03 NOTE — Assessment & Plan Note (Signed)
She is due for followup with Dr. Russella Dar and will ask him about this.

## 2011-12-03 NOTE — Assessment & Plan Note (Signed)
Good compliance and control. She will ask Advanced about cleaning her humidifier reservoir.

## 2011-12-28 ENCOUNTER — Ambulatory Visit: Payer: BC Managed Care – PPO | Admitting: Internal Medicine

## 2012-01-04 ENCOUNTER — Ambulatory Visit: Payer: BC Managed Care – PPO | Admitting: Internal Medicine

## 2012-01-19 ENCOUNTER — Other Ambulatory Visit: Payer: Self-pay | Admitting: Cardiovascular Disease

## 2012-01-19 DIAGNOSIS — I10 Essential (primary) hypertension: Secondary | ICD-10-CM

## 2012-01-19 MED ORDER — ATENOLOL 25 MG PO TABS: 25.0000 mg | ORAL_TABLET | Freq: Every day | ORAL | Status: DC

## 2012-02-27 ENCOUNTER — Encounter: Payer: Self-pay | Admitting: Gastroenterology

## 2012-03-07 ENCOUNTER — Ambulatory Visit (INDEPENDENT_AMBULATORY_CARE_PROVIDER_SITE_OTHER): Payer: Medicare Other | Admitting: Cardiovascular Disease

## 2012-03-07 ENCOUNTER — Encounter: Payer: Self-pay | Admitting: Cardiovascular Disease

## 2012-03-07 VITALS — BP 113/75 | HR 57 | Ht 61.0 in | Wt 139.8 lb

## 2012-03-07 DIAGNOSIS — I4891 Unspecified atrial fibrillation: Secondary | ICD-10-CM

## 2012-03-07 DIAGNOSIS — E785 Hyperlipidemia, unspecified: Secondary | ICD-10-CM

## 2012-03-07 DIAGNOSIS — I10 Essential (primary) hypertension: Secondary | ICD-10-CM

## 2012-03-07 DIAGNOSIS — I2510 Atherosclerotic heart disease of native coronary artery without angina pectoris: Secondary | ICD-10-CM

## 2012-03-07 MED ORDER — EZETIMIBE 10 MG PO TABS: 10.0000 mg | ORAL_TABLET | Freq: Every day | ORAL | Status: DC

## 2012-03-07 MED ORDER — ATENOLOL 25 MG PO TABS: 25.0000 mg | ORAL_TABLET | Freq: Every day | ORAL | Status: DC

## 2012-03-07 MED ORDER — AMLODIPINE BESYLATE 5 MG PO TABS: 5.0000 mg | ORAL_TABLET | Freq: Every day | ORAL | Status: DC

## 2012-03-07 NOTE — Progress Notes (Signed)
Shannon Cummings Date of Birth  1946/03/08 [facility] [address]     [address] [place], [place]  [address] [phone]  Fax  [phone]  Problem list: 1. Coronary artery disease-status post stenting in the past 2. Intermittent atrial fibrillation  History of Present Illness:  Pt is doing well.  No cardiac complaints.     She complains of some tingling and numbness associated with the Crestor.  She has discontinued the Crestor because of that reason.  She has had reactions to most statins that we have tried.  October 08-2011  She has done well.  She has not had any angina or eposides of atrial fib.  She has retired and is traveling quite a bit.  Her husband has also retired.  She has not had any problems.  Current Outpatient Prescriptions on File Prior to Visit  Medication Sig Dispense Refill  . aspirin 81 MG EC tablet Take 81 mg by mouth daily.        . cholecalciferol (VITAMIN D) 1000 UNITS tablet Take 1,000 Units by mouth daily.      . propafenone (RYTHMOL) 150 MG tablet Take 1 tablet (150 mg total) by mouth 2 (two) times daily.  180 tablet  6  . simethicone (MYLICON) 80 MG chewable tablet Chew 80 mg by mouth every 6 (six) hours as needed.        . temazepam (RESTORIL) 15 MG capsule Take 1 capsule (15 mg total) by mouth at bedtime as needed for sleep.  90 capsule  3  . DISCONTD: atenolol (TENORMIN) 25 MG tablet Take 1 tablet (25 mg total) by mouth daily.  90 tablet  0  . DISCONTD: amLODipine (NORVASC) 5 MG tablet Take 1 tablet (5 mg total) by mouth daily.  90 tablet  3    Allergies  Allergen Reactions  . Statins     Past Medical History  Diagnosis Date  . OSA on CPAP   . Palpitation   . Hyperlipidemia   . Hypertension   . Atrial fibrillation     Past Surgical History  Procedure Date  . Total abdominal hysterectomy   . Tonsillectomy     History  Smoking status  . Never Smoker   Smokeless tobacco  . Not on file     History  Alcohol Use  . Yes    Family History  Problem Relation Age of Onset  . Cancer    . Heart disease Father   . Alzheimer's disease Mother     Reviw of Systems:  Reviewed in the HPI.  All other systems are negative.  Physical Exam: BP 113/75  Pulse 57  Ht 5\' 1"  (1.549 m)  Wt 139 lb 12.8 oz (63.413 kg)  BMI 26.42 kg/m2 The patient is alert and oriented x 3.  The mood and affect are normal.   Skin: warm and dry.  Color is normal.    HEENT:   the sclera are nonicteric.  The mucous membranes are moist.  The carotids are 2+ without bruits.  There is no thyromegaly.  There is no JVD.    Lungs: clear.  The chest wall is non tender.    Heart: regular rate with a normal S1 and S2.  There are no murmurs, gallops, or rubs. The PMI is not displaced.     Abdomin: good bowel sounds.  There is no guarding or rebound.  There is no hepatosplenomegaly or tenderness.  There are no masses.  Extremities:  no clubbing, cyanosis, or edema.  The legs are without rashes.  The distal pulses are intact.   Neuro:  Cranial nerves II - XII are intact.  Motor and sensory functions are intact.    The gait is normal.  Assessment / Plan:

## 2012-03-07 NOTE — Patient Instructions (Addendum)
Your physician recommends that you return for lab work in: tomorrow and in 3  months  Your physician has recommended you make the following change in your medication: start taking Zetia 10 mg daily  Your physician wants you to follow-up in: 6 months You will receive a reminder letter in the mail two months in advance. If you don't receive a letter, please call our office to schedule the follow-up appointment.

## 2012-03-08 ENCOUNTER — Other Ambulatory Visit (INDEPENDENT_AMBULATORY_CARE_PROVIDER_SITE_OTHER): Payer: Medicare Other

## 2012-03-08 DIAGNOSIS — E785 Hyperlipidemia, unspecified: Secondary | ICD-10-CM

## 2012-03-08 LAB — LIPID PANEL
Cholesterol: 287 mg/dL — ABNORMAL HIGH (ref 0–200)
Total CHOL/HDL Ratio: 6
VLDL: 30.8 mg/dL (ref 0.0–40.0)

## 2012-03-08 LAB — HEPATIC FUNCTION PANEL
ALT: 25 U/L (ref 0–35)
Alkaline Phosphatase: 65 U/L (ref 39–117)
Bilirubin, Direct: 0.1 mg/dL (ref 0.0–0.3)
Total Bilirubin: 0.7 mg/dL (ref 0.3–1.2)
Total Protein: 7.1 g/dL (ref 6.0–8.3)

## 2012-03-08 LAB — BASIC METABOLIC PANEL
BUN: 13 mg/dL (ref 6–23)
Chloride: 103 mEq/L (ref 96–112)
Creatinine, Ser: 0.8 mg/dL (ref 0.4–1.2)
Glucose, Bld: 94 mg/dL (ref 70–99)
Potassium: 3.8 mEq/L (ref 3.5–5.1)

## 2012-03-08 LAB — LDL CHOLESTEROL, DIRECT: Direct LDL: 201 mg/dL

## 2012-03-08 NOTE — Assessment & Plan Note (Addendum)
Shannon Cummings has not tolerated any of the statin medications. We'll try her on Zetia 10 mg a day.  We will bring her back in 3 months for fasting labs. I will see her again in 6 months. We may need to get fasting labs at that time as well.

## 2012-03-08 NOTE — Assessment & Plan Note (Signed)
Shannon Cummings is doing well. She's not had any episodes of chest pain or shortness breath. We'll continue with her same medications.  I've asked her to call me right away she hasn't episodes of chest discomfort. We may need to re- think the Rythmol therapy if she starts having episodes of chest pain.  She's done so well on the Rythmol therapy that she does not want to change at this point. She has not had a myocardial infarction.

## 2012-03-08 NOTE — Assessment & Plan Note (Signed)
Shannon Cummings is doing well. She's not had any recurrent atrial fibrillation.

## 2012-03-13 ENCOUNTER — Encounter: Payer: Self-pay | Admitting: Cardiovascular Disease

## 2012-04-17 ENCOUNTER — Encounter: Payer: Self-pay | Admitting: Gastroenterology

## 2012-04-17 ENCOUNTER — Ambulatory Visit (INDEPENDENT_AMBULATORY_CARE_PROVIDER_SITE_OTHER): Payer: Medicare Other | Admitting: Gastroenterology

## 2012-04-17 VITALS — BP 120/62 | HR 53 | Ht 61.0 in | Wt 141.0 lb

## 2012-04-17 DIAGNOSIS — R141 Gas pain: Secondary | ICD-10-CM

## 2012-04-17 DIAGNOSIS — R143 Flatulence: Secondary | ICD-10-CM

## 2012-04-17 DIAGNOSIS — Z860100 Personal history of colon polyps, unspecified: Secondary | ICD-10-CM

## 2012-04-17 DIAGNOSIS — R198 Other specified symptoms and signs involving the digestive system and abdomen: Secondary | ICD-10-CM

## 2012-04-17 DIAGNOSIS — K219 Gastro-esophageal reflux disease without esophagitis: Secondary | ICD-10-CM

## 2012-04-17 DIAGNOSIS — K760 Fatty (change of) liver, not elsewhere classified: Secondary | ICD-10-CM

## 2012-04-17 DIAGNOSIS — K7689 Other specified diseases of liver: Secondary | ICD-10-CM

## 2012-04-17 MED ORDER — PEG-KCL-NACL-NASULF-NA ASC-C 100 G PO SOLR: 1.0000 | Freq: Once | ORAL | Status: DC

## 2012-04-17 MED ORDER — ALIGN 4 MG PO CAPS: 1.0000 | ORAL_CAPSULE | Freq: Every day | ORAL | Status: DC

## 2012-04-17 NOTE — Patient Instructions (Addendum)
You have been scheduled for a colonoscopy with propofol. Please follow written instructions given to you at your visit today.  Please pick up your prep kit at the pharmacy within the next 1-3 days. If you use inhalers (even only as needed) or a CPAP machine, please bring them with you on the day of your procedure.  We have given you samples of Align. This puts good bacteria back into your colon. You should take 1 capsule by mouth once daily. If this works well for you, it can be purchased over the counter.  You have been given a Low fat diet, and Low gas diet.  Please use Gas-X over the counter four times a day as needed for gas and bloating.  Patient advised to avoid spicy, acidic, citrus, chocolate, mints, fruit and fruit juices.  Limit the intake of caffeine, alcohol and Soda.  Don't exercise too soon after eating.  Don't lie down within 3-4 hours of eating.  Elevate the head of your bed.  Please continue your Cimetidine for acid reflux.  cc: Herb Grays, MD

## 2012-04-17 NOTE — Progress Notes (Addendum)
History of Present Illness: This is a  66 year old female with multiple gastrointestinal complaints. She states she had the onset of epigastric pain and bloating this summer. The symptoms were brought on by meals. She began taking cimetidine and her symptoms completely resolved. She notes mild discomfort and reflux symptoms when she discontinues cimetidine. She has noted a slight variation in bowel habits alternating diarrhea and constipation for the past several months. She also notes a long history of increased intestinal gas and flatulence. She underwent an abdominal ultrasound in 10/2011 and it showed fatty infiltration of liver and a CT scan in 11/2011 which also showed fatty infiltration of liver. She has a history of adenomatous colon polyp on colonoscopy in 2007 and she is due for colonoscopy. Denies weight loss,  change in stool caliber, melena, hematochezia, nausea, vomiting, dysphagia, chest pain.  Allergies  Allergen Reactions  . Statins    Outpatient Prescriptions Prior to Visit  Medication Sig Dispense Refill  . amLODipine (NORVASC) 5 MG tablet Take 1 tablet (5 mg total) by mouth daily.  90 tablet  3  . atenolol (TENORMIN) 25 MG tablet Take 1 tablet (25 mg total) by mouth daily.  90 tablet  3  . cholecalciferol (VITAMIN D) 1000 UNITS tablet Take 1,000 Units by mouth daily.      . propafenone (RYTHMOL) 150 MG tablet Take 1 tablet (150 mg total) by mouth 2 (two) times daily.  180 tablet  6  . temazepam (RESTORIL) 15 MG capsule Take 1 capsule (15 mg total) by mouth at bedtime as needed for sleep.  90 capsule  3  . [DISCONTINUED] aspirin 81 MG EC tablet Take 81 mg by mouth daily.        . [DISCONTINUED] ezetimibe (ZETIA) 10 MG tablet Take 1 tablet (10 mg total) by mouth daily.  30 tablet  1  . [DISCONTINUED] simethicone (MYLICON) 80 MG chewable tablet Chew 80 mg by mouth every 6 (six) hours as needed.         Last reviewed on 04/17/2012  9:39 AM by Meryl Dare, MD,FACG Past Medical  History  Diagnosis Date  . OSA on CPAP   . Palpitation   . Hyperlipidemia   . Hypertension   . Atrial fibrillation   . Tubular adenoma of colon 2007  . CAD (coronary artery disease)   . Osteoporosis   . Fatty infiltration of liver   . GERD (gastroesophageal reflux disease)    Past Surgical History  Procedure Date  . Total abdominal hysterectomy   . Tonsillectomy   . Coronary stent placement 2000   History   Social History  . Marital Status: Married    Spouse Name: N/A    Number of Children: 2  . Years of Education: N/A   Occupational History  . 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 Smoking status: Never Smoker   . Smokeless tobacco: Never Used  . Alcohol Use: Yes     Comment: 2 drinks daily   . Drug Use: No  . Sexually Active: None   Other Topics Concern  . None   Social History Narrative   Daily caffeine    Family History  Problem Relation Age of Onset  . Breast cancer Maternal Aunt   . Heart disease Father   . Alzheimer's disease Mother   . Colon cancer Neg Hx     Review of Systems: Pertinent positive and negative review of  systems were noted in the above HPI section. All other review of systems were otherwise negative.  Physical Exam: General: Well developed , well nourished, no acute distress Head: Normocephalic and atraumatic Eyes:  sclerae anicteric, EOMI Ears: Normal auditory acuity Mouth: No deformity or lesions Neck: Supple, no masses or thyromegaly Lungs: Clear throughout to auscultation Heart: Regular rate and rhythm; no murmurs, rubs or bruits Abdomen: Soft, non tender and non distended. No masses, hepatosplenomegaly or hernias noted. Normal Bowel sounds Rectal:deferred to colonoscopy Musculoskeletal: Symmetrical with no gross deformities  Skin: No lesions on visible extremities Pulses:  Normal pulses noted Extremities: No clubbing, cyanosis, edema or deformities noted Neurological: Alert oriented  x 4, grossly nonfocal Cervical Nodes:  No significant cervical adenopathy Inguinal Nodes: No significant inguinal adenopathy Psychological:  Alert and cooperative. Normal mood and affect  Assessment and Recommendations:  1. Epigastric pain and bloating related to meals. Strongly suspect GERD. Symptoms have resolved with cimetidine. We discussed proceeding with upper endoscopy for further evaluation and she would like to defer this exam for now. If her symptoms worsen she will reconsider.  2. Fatty infiltration of the liver. A long-term low fat diet and weight loss program supervised by her primary physician. Optimal management of hyperlipidemia her primary physician.  3. Change in bowel habits with alternating diarrhea and constipation. Possible irritable bowel syndrome. Increase dietary fiber and water intake.  4. Personal history of adenomatous colon polyps. She is overdue for colonoscopy. Schedule colonoscopy. The risks, benefits, and alternatives to colonoscopy with possible biopsy and possible polypectomy were discussed with the patient and they consent to proceed.   5. Intestinal gas. Low gas diet, Gas-X 4 times a day when necessary, trial of probiotics.

## 2012-04-19 ENCOUNTER — Ambulatory Visit (AMBULATORY_SURGERY_CENTER): Payer: Medicare Other | Admitting: Gastroenterology

## 2012-04-19 ENCOUNTER — Encounter: Payer: Self-pay | Admitting: Gastroenterology

## 2012-04-19 VITALS — BP 120/69 | HR 46 | Temp 97.4°F | Resp 18 | Ht 61.0 in | Wt 141.0 lb

## 2012-04-19 DIAGNOSIS — D126 Benign neoplasm of colon, unspecified: Secondary | ICD-10-CM

## 2012-04-19 DIAGNOSIS — Z860100 Personal history of colon polyps, unspecified: Secondary | ICD-10-CM

## 2012-04-19 DIAGNOSIS — Z1211 Encounter for screening for malignant neoplasm of colon: Secondary | ICD-10-CM

## 2012-04-19 MED ORDER — SODIUM CHLORIDE 0.9 % IV SOLN: 500.0000 mL | INTRAVENOUS | Status: DC

## 2012-04-19 NOTE — Op Note (Addendum)
[facility] [address] [place], [address]   COLONOSCOPY PROCEDURE REPORT PATIENT: Giani, Winther  MR#: [id number] BIRTHDATE: 1945-07-01 , 66  yrs. old GENDER: Female ENDOSCOPIST: Meryl Dare, MD, [facility] PROCEDURE DATE:  04/19/2012 PROCEDURE:   Colonoscopy with snare polypectomy and Colonoscopy with biopsy ASA CLASS:   Class II INDICATIONS:patient's personal history of adenomatous colon polyps and change in bowel habits. MEDICATIONS: MAC sedation, administered by CRNA and propofol (Diprivan) 180mg  IV DESCRIPTION OF PROCEDURE:   After the risks benefits and alternatives of the procedure were thoroughly explained, informed consent was obtained.  A digital rectal exam revealed no abnormalities of the rectum.   The LB [id number]  endoscope was introduced through the anus and advanced to the cecum, which was identified by both the appendix and ileocecal valve. No adverse events experienced.   The quality of the prep was good, using MoviPrep  The instrument was then slowly withdrawn as the colon was fully examined.  COLON FINDINGS: Two sessile polyps measuring 4 mm in size were found at the hepatic flexure and in the transverse colon.  A polypectomy was performed with cold forceps.  The resection was complete and the polyp tissue was completely retrieved.   A sessile polyp measuring 10 mm in size was found in the distal transverse colon. A polypectomy was performed using snare cautery.  The resection was complete and the polyp tissue was completely retrieved.   A sessile polyp measuring 5 mm in size was found in the distal transverse colon.  A polypectomy was performed with a cold snare.  The resection was complete and the polyp tissue was completely retrieved.   A sessile polyp measuring 4 mm in size was found in the descending colon.  A polypectomy was performed with cold forceps.  The resection was complete and the polyp tissue was completely  retrieved.   Mild diverticulosis was noted at the hepatic flexure, in the transverse colon, and sigmoid colon.   The colon was otherwise normal.  There was no diverticulosis, inflammation, polyps or cancers unless previously stated. Retroflexed views revealed internal hemorrhoids. The time to cecum=2 minutes 37 seconds.  Withdrawal time=13 minutes 38 seconds. The scope was withdrawn and the procedure completed. COMPLICATIONS: There were no complications.  ENDOSCOPIC IMPRESSION: 1.   Two sessile polyps at the hepatic flexure and in the transverse colon; polypectomy was performed with cold forceps 2.   Sessile polyp measuring 10 mm in the distal transverse colon; polypectomy was performed using snare cautery 3.   Sessile polyp measuring 5 mm in the distal transverse colon; polypectomy was performed with a cold snare 4.   Sessile polyp measuring 4 mm in the descending colon; polypectomy was performed with cold forceps 5.   Mild diverticulosis was noted at the hepatic flexure, in the transverse colon, and sigmoid colon 6.   Moderate internal hemorrhoids  RECOMMENDATIONS: 1.  Hold aspirin, aspirin products, and anti-inflammatory medication for 2 weeks. 2.  Await pathology results 3.  High fiber diet with liberal fluid intake. 4.  Repeat Colonoscopy in 3 years.   eSigned:  Meryl Dare, MD, [facility] 04/19/2012 11:57 AM Revised: 04/19/2012 11:57 AM  cc: Herb Grays, MD   PATIENT NAME:  Malorie, Bigford MR#: [id number]

## 2012-04-19 NOTE — Patient Instructions (Addendum)
Impressions/recommendations:  Polyps-handout given Diverticulosis-handout given Hemorrhoids-handout given  Hold aspirin, aspirin products and anti-inflammatory medicines for two weeks. May resume 05/04/12. High fiber diet. Repeat colonoscopy in 3 years.  YOU HAD AN ENDOSCOPIC PROCEDURE TODAY AT THE [facility]: Refer to the procedure report that was given to you for any specific questions about what was found during the examination.  If the procedure report does not answer your questions, please call your gastroenterologist to clarify.  If you requested that your care partner not be given the details of your procedure findings, then the procedure report has been included in a sealed envelope for you to review at your convenience later.  YOU SHOULD EXPECT: Some feelings of bloating in the abdomen. Passage of more gas than usual.  Walking can help get rid of the air that was put into your GI tract during the procedure and reduce the bloating. If you had a lower endoscopy (such as a colonoscopy or flexible sigmoidoscopy) you may notice spotting of blood in your stool or on the toilet paper. If you underwent a bowel prep for your procedure, then you may not have a normal bowel movement for a few days.  DIET: Your first meal following the procedure should be a light meal and then it is ok to progress to your normal diet.  A half-sandwich or bowl of soup is an example of a good first meal.  Heavy or fried foods are harder to digest and may make you feel nauseous or bloated.  Likewise meals heavy in dairy and vegetables can cause extra gas to form and this can also increase the bloating.  Drink plenty of fluids but you should avoid alcoholic beverages for 24 hours.  ACTIVITY: Your care partner should take you home directly after the procedure.  You should plan to take it easy, moving slowly for the rest of the day.  You can resume normal activity the day after the procedure however you should  NOT DRIVE or use heavy machinery for 24 hours (because of the sedation medicines used during the test).    SYMPTOMS TO REPORT IMMEDIATELY: A gastroenterologist can be reached at any hour.  During normal business hours, 8:30 AM to 5:00 PM Monday through Friday, call [phone].  After hours and on weekends, please call the GI answering service at [phone] who will take a message and have the physician on call contact you.   Following lower endoscopy (colonoscopy or flexible sigmoidoscopy):  Excessive amounts of blood in the stool  Significant tenderness or worsening of abdominal pains  Swelling of the abdomen that is new, acute  Fever of 100F or higher  Following upper endoscopy (EGD)  Vomiting of blood or coffee ground material  New chest pain or pain under the shoulder blades  Painful or persistently difficult swallowing  New shortness of breath  Fever of 100F or higher  Black, tarry-looking stools  FOLLOW UP: If any biopsies were taken you will be contacted by phone or by letter within the next 1-3 weeks.  Call your gastroenterologist if you have not heard about the biopsies in 3 weeks.  Our staff will call the home number listed on your records the next business day following your procedure to check on you and address any questions or concerns that you may have at that time regarding the information given to you following your procedure. This is a courtesy call and so if there is no answer at the home number and we  have not heard from you through the emergency physician on call, we will assume that you have returned to your regular daily activities without incident.  SIGNATURES/CONFIDENTIALITY: You and/or your care partner have signed paperwork which will be entered into your electronic medical record.  These signatures attest to the fact that that the information above on your After Visit Summary has been reviewed and is understood.  Full responsibility of the confidentiality  of this discharge information lies with you and/or your care-partner.

## 2012-04-19 NOTE — Progress Notes (Signed)
Patient did not experience any of the following events: a burn prior to discharge; a fall within the facility; wrong site/side/patient/procedure/implant event; or a hospital transfer or hospital admission upon discharge from the facility. (G8907) Patient did not have preoperative order for IV antibiotic SSI prophylaxis. (G8918)  

## 2012-04-22 ENCOUNTER — Telehealth: Payer: Self-pay | Admitting: *Deleted

## 2012-04-22 NOTE — Telephone Encounter (Signed)
Message left

## 2012-04-23 ENCOUNTER — Encounter: Payer: Self-pay | Admitting: Gastroenterology

## 2012-05-31 ENCOUNTER — Encounter: Payer: Self-pay | Admitting: Cardiovascular Disease

## 2012-06-05 DIAGNOSIS — I219 Acute myocardial infarction, unspecified: Secondary | ICD-10-CM

## 2012-06-05 HISTORY — DX: Acute myocardial infarction, unspecified: I21.9

## 2012-06-11 ENCOUNTER — Other Ambulatory Visit: Payer: Medicare Other

## 2012-07-08 ENCOUNTER — Telehealth: Payer: Self-pay | Admitting: Cardiovascular Disease

## 2012-07-08 MED ORDER — PROPAFENONE HCL 150 MG PO TABS: 150.0000 mg | ORAL_TABLET | Freq: Two times a day (BID) | ORAL | Status: DC

## 2012-07-08 NOTE — Telephone Encounter (Signed)
New problem   prothasenone hcl  150 mg.    [facility] in [place].

## 2012-07-11 ENCOUNTER — Other Ambulatory Visit: Payer: Self-pay | Admitting: Cardiology

## 2012-07-20 ENCOUNTER — Other Ambulatory Visit: Payer: Self-pay

## 2012-08-10 ENCOUNTER — Encounter (HOSPITAL_COMMUNITY): Payer: Self-pay

## 2012-08-10 ENCOUNTER — Other Ambulatory Visit: Payer: Self-pay

## 2012-08-10 ENCOUNTER — Emergency Department (HOSPITAL_COMMUNITY): Payer: Medicare Other

## 2012-08-10 ENCOUNTER — Emergency Department (HOSPITAL_COMMUNITY)
Admission: EM | Admit: 2012-08-10 | Discharge: 2012-08-10 | Disposition: A | Discharge status: Home / Self Care | Payer: Medicare Other | Attending: Emergency Medicine | Admitting: Emergency Medicine

## 2012-08-10 DIAGNOSIS — R5381 Other malaise: Secondary | ICD-10-CM | POA: Insufficient documentation

## 2012-08-10 DIAGNOSIS — I252 Old myocardial infarction: Secondary | ICD-10-CM | POA: Insufficient documentation

## 2012-08-10 DIAGNOSIS — Z8719 Personal history of other diseases of the digestive system: Secondary | ICD-10-CM | POA: Insufficient documentation

## 2012-08-10 DIAGNOSIS — R110 Nausea: Secondary | ICD-10-CM | POA: Insufficient documentation

## 2012-08-10 DIAGNOSIS — I10 Essential (primary) hypertension: Secondary | ICD-10-CM | POA: Insufficient documentation

## 2012-08-10 DIAGNOSIS — G4733 Obstructive sleep apnea (adult) (pediatric): Secondary | ICD-10-CM | POA: Insufficient documentation

## 2012-08-10 DIAGNOSIS — M810 Age-related osteoporosis without current pathological fracture: Secondary | ICD-10-CM | POA: Insufficient documentation

## 2012-08-10 DIAGNOSIS — I4891 Unspecified atrial fibrillation: Secondary | ICD-10-CM | POA: Insufficient documentation

## 2012-08-10 DIAGNOSIS — R0602 Shortness of breath: Secondary | ICD-10-CM | POA: Insufficient documentation

## 2012-08-10 DIAGNOSIS — R0789 Other chest pain: Secondary | ICD-10-CM | POA: Insufficient documentation

## 2012-08-10 DIAGNOSIS — K219 Gastro-esophageal reflux disease without esophagitis: Secondary | ICD-10-CM | POA: Insufficient documentation

## 2012-08-10 DIAGNOSIS — Z79899 Other long term (current) drug therapy: Secondary | ICD-10-CM | POA: Insufficient documentation

## 2012-08-10 DIAGNOSIS — I2510 Atherosclerotic heart disease of native coronary artery without angina pectoris: Secondary | ICD-10-CM | POA: Insufficient documentation

## 2012-08-10 DIAGNOSIS — Z7982 Long term (current) use of aspirin: Secondary | ICD-10-CM | POA: Insufficient documentation

## 2012-08-10 DIAGNOSIS — Z9861 Coronary angioplasty status: Secondary | ICD-10-CM | POA: Insufficient documentation

## 2012-08-10 DIAGNOSIS — R079 Chest pain, unspecified: Secondary | ICD-10-CM

## 2012-08-10 DIAGNOSIS — R61 Generalized hyperhidrosis: Secondary | ICD-10-CM | POA: Insufficient documentation

## 2012-08-10 DIAGNOSIS — E785 Hyperlipidemia, unspecified: Secondary | ICD-10-CM | POA: Insufficient documentation

## 2012-08-10 DIAGNOSIS — Z8679 Personal history of other diseases of the circulatory system: Secondary | ICD-10-CM | POA: Insufficient documentation

## 2012-08-10 LAB — CBC
MCV: 89.8 fL (ref 78.0–100.0)
Platelets: 189 10*3/uL (ref 150–400)
RBC: 5.02 MIL/uL (ref 3.87–5.11)
RDW: 13.3 % (ref 11.5–15.5)
WBC: 6.2 10*3/uL (ref 4.0–10.5)

## 2012-08-10 LAB — POCT I-STAT TROPONIN I: Troponin i, poc: 0 ng/mL (ref 0.00–0.08)

## 2012-08-10 LAB — BASIC METABOLIC PANEL
CO2: 27 mEq/L (ref 19–32)
Chloride: 101 mEq/L (ref 96–112)
Creatinine, Ser: 0.82 mg/dL (ref 0.50–1.10)
GFR calc Af Amer: 85 mL/min — ABNORMAL LOW (ref >90)
Potassium: 3.7 mEq/L (ref 3.5–5.1)
Sodium: 140 mEq/L (ref 135–145)

## 2012-08-10 MED ORDER — ASPIRIN 81 MG PO CHEW
324.0000 mg | CHEWABLE_TABLET | Freq: Once | ORAL | Status: AC
  Administered 2012-08-10: 324 mg via ORAL
  Filled 2012-08-10: qty 4

## 2012-08-10 MED ORDER — NITROGLYCERIN 0.4 MG SL SUBL
0.4000 mg | SUBLINGUAL_TABLET | SUBLINGUAL | Status: DC | PRN
  Administered 2012-08-10: 0.4 mg via SUBLINGUAL
  Filled 2012-08-10: qty 25

## 2012-08-10 NOTE — ED Provider Notes (Addendum)
History     CSN: [id number]  Arrival date & time 08/10/12  1538   First MD Initiated Contact with Patient 08/10/12 1824      Chief Complaint  Patient presents with  . Chest Pain    (Consider location/radiation/quality/duration/timing/severity/associated sxs/prior treatment) HPI Comments: Patient is a 67 year old female with a history of coronary artery disease status post stent placement in 2000 and as well as paroxysmal atrial fibrillation. She presents today with chest tightness. She states she had some last night however it went away and then started back again around lunchtime today. She states it's been constant however waxing and waning since lunchtime today. She describes as a tightness that radiates to her back. She also has radiation to her upper abdomen. She is short of breath as well. She states her symptoms are worse with exertion. She states it feels similar to her past angina when she had a stent placement. She's had some nausea but no vomiting. She had one episode of diaphoresis earlier today. She denies any cough or chest congestion. She denies any leg pain or swelling. She last had a stress test in June of 2012.  Patient is a 68 y.o. female presenting with chest pain.  Chest Pain Associated symptoms: diaphoresis, fatigue, nausea and shortness of breath   Associated symptoms: no abdominal pain, no back pain, no cough, no dizziness, no fever, no headache, no numbness, not vomiting and no weakness     Past Medical History  Diagnosis Date  . OSA on CPAP   . Palpitation   . Hyperlipidemia   . Hypertension   . Atrial fibrillation   . Tubular adenoma of colon 2007  . CAD (coronary artery disease)   . Osteoporosis   . Fatty infiltration of liver   . GERD (gastroesophageal reflux disease)     Past Surgical History  Procedure Laterality Date  . Total abdominal hysterectomy    . Tonsillectomy    . Coronary stent placement  2000    Family History  Problem Relation Age  of Onset  . Breast cancer Maternal Aunt   . Heart disease Father   . Alzheimer's disease Mother   . Colon cancer Neg Hx     History  Substance Use Topics  . Smoking status: Never Smoker   . Smokeless tobacco: Never Used  . Alcohol Use: Yes     Comment: 2 drinks daily     OB History   Grav Para Term Preterm Abortions TAB SAB Ect Mult Living                  Review of Systems  Constitutional: Positive for diaphoresis and fatigue. Negative for fever and chills.  HENT: Negative for congestion, rhinorrhea and sneezing.   Eyes: Negative.   Respiratory: Positive for shortness of breath. Negative for cough and chest tightness.   Cardiovascular: Positive for chest pain. Negative for leg swelling.  Gastrointestinal: Positive for nausea. Negative for vomiting, abdominal pain, diarrhea and blood in stool.  Genitourinary: Negative for frequency, hematuria, flank pain and difficulty urinating.  Musculoskeletal: Negative for back pain and arthralgias.  Skin: Negative for rash.  Neurological: Negative for dizziness, speech difficulty, weakness, numbness and headaches.    Allergies  Statins  Home Medications   Current Outpatient Rx  Name  Route  Sig  Dispense  Refill  . AMBULATORY NON FORMULARY MEDICATION      CPAP Machine USES AS DIRECTED         . amLODipine (NORVASC)  5 MG tablet   Oral   Take 1 tablet (5 mg total) by mouth daily.   90 tablet   3   . aspirin 325 MG tablet   Oral   Take 325 mg by mouth daily.         Marland Kitchen atenolol (TENORMIN) 25 MG tablet   Oral   Take 1 tablet (25 mg total) by mouth daily.   90 tablet   3   . cholecalciferol (VITAMIN D) 1000 UNITS tablet   Oral   Take 1,000 Units by mouth daily.         . Cimetidine (ACID REDUCER PO)   Oral   Take 1 capsule by mouth daily.         . Probiotic Product (ALIGN) 4 MG CAPS   Oral   Take 1 capsule by mouth daily.   30 capsule   0     Samples given to patient    GUY#[id number] A1         ...    . propafenone (RYTHMOL) 150 MG tablet      TAKE 1 TABLET BY MOUTH TWICE A DAY   180 tablet   1   . temazepam (RESTORIL) 15 MG capsule   Oral   Take 1 capsule (15 mg total) by mouth at bedtime as needed for sleep.   90 capsule   3     BP 143/91  Pulse 72  Temp(Src) 98 F (36.7 C) (Oral)  Resp 18  SpO2 97%  Physical Exam  Constitutional: She is oriented to person, place, and time. She appears well-developed and well-nourished.  HENT:  Head: Normocephalic and atraumatic.  Mouth/Throat: Oropharynx is clear and moist.  Eyes: Pupils are equal, round, and reactive to light.  Neck: Normal range of motion. Neck supple.  Cardiovascular: Normal rate, regular rhythm and normal heart sounds.   Pulmonary/Chest: Effort normal and breath sounds normal. No respiratory distress. She has no wheezes. She has no rales. She exhibits no tenderness.  Abdominal: Soft. Bowel sounds are normal. There is no tenderness. There is no rebound and no guarding.  Musculoskeletal: Normal range of motion. She exhibits no edema.  Lymphadenopathy:    She has no cervical adenopathy.  Neurological: She is alert and oriented to person, place, and time.  Skin: Skin is warm and dry. No rash noted.  Psychiatric: She has a normal mood and affect.    ED Course  Procedures (including critical care time)  Results for orders placed during the hospital encounter of 08/10/12  CBC      Result Value Range   WBC 6.2  4.0 - 10.5 K/uL   RBC 5.02  3.87 - 5.11 MIL/uL   Hemoglobin 15.8 (*) 12.0 - 15.0 g/dL   HCT 95.6  38.7 - 56.4 %   MCV 89.8  78.0 - 100.0 fL   MCH 31.5  26.0 - 34.0 pg   MCHC 35.0  30.0 - 36.0 g/dL   RDW 33.2  95.1 - 88.4 %   Platelets 189  150 - 400 K/uL  BASIC METABOLIC PANEL      Result Value Range   Sodium 140  135 - 145 mEq/L   Potassium 3.7  3.5 - 5.1 mEq/L   Chloride 101  96 - 112 mEq/L   CO2 27  19 - 32 mEq/L   Glucose, Bld 107 (*) 70 - 99 mg/dL   BUN 12  6 - 23 mg/dL   Creatinine, Ser  1.66  0.50 - 1.10 mg/dL   Calcium 9.8  8.4 - 40.9 mg/dL   GFR calc non Af Amer 73 (*) >90 mL/min   GFR calc Af Amer 85 (*) >90 mL/min  POCT I-STAT TROPONIN I      Result Value Range   Troponin i, poc 0.00  0.00 - 0.08 ng/mL   Comment 3            Dg Chest 2 View  08/10/2012  *RADIOLOGY REPORT*  Clinical Data: Chest pain, short of breath  CHEST - 2 VIEW  Comparison: Prior chest x-ray 11/22/2010  Findings: The lungs are well-aerated and free from pulmonary edema, focal airspace consolidation or pulmonary nodule.  Chronic left basilar atelectasis or scarring is similar to prior exams. Cardiac and mediastinal contours are within normal limits.  No pneumothorax, or pleural effusion. No acute osseous findings.  IMPRESSION:  No acute cardiopulmonary disease.   Original Report Authenticated By: Malachy Moan, M.D.      Date: 08/10/2012  Rate: 63  Rhythm: normal sinus rhythm  QRS Axis: normal  Intervals: normal  ST/T Wave abnormalities: normal  Conduction Disutrbances:none  Narrative Interpretation:   Old EKG Reviewed: unchanged   Date: 08/10/2012  Rate: 64  Rhythm: normal sinus rhythm  QRS Axis: normal  Intervals: normal  ST/T Wave abnormalities: normal  Conduction Disutrbances:none  Narrative Interpretation:   Old EKG Reviewed: unchanged    1. Chest pain       MDM  Patient presents with chest pain similar to her past angina symptoms when she had her stent placed. Her EKG did not show any ischemic changes and her troponin is normal. I discussed this with the cardiologist on call for low-power cardiology who is coming to see the patient.she was given aspirin and nitroglycerin.   Pt was seen by cardiology and is now refusing to be admitted.  Cards has recommended admission, but pt is refusing.  Is leaving AMA, will f/u with cards on Monday.  Advised to return over the weekend if symptoms worsen.     Rolan Bucco, MD 08/10/12 8119  Rolan Bucco, MD 08/10/12 2218

## 2012-08-10 NOTE — ED Notes (Signed)
Patient presents with intermittent chest pain that began last night while walking up stairs.  Patient reporting chest pain is worst upon exertion. Patient also reporting SOB and diaphoresis.  Denies chest pain at this time.

## 2012-08-13 ENCOUNTER — Encounter (HOSPITAL_COMMUNITY): Payer: Self-pay | Admitting: Emergency Medicine

## 2012-08-13 DIAGNOSIS — Z7982 Long term (current) use of aspirin: Secondary | ICD-10-CM

## 2012-08-13 DIAGNOSIS — E785 Hyperlipidemia, unspecified: Secondary | ICD-10-CM | POA: Diagnosis present

## 2012-08-13 DIAGNOSIS — Z9861 Coronary angioplasty status: Secondary | ICD-10-CM

## 2012-08-13 DIAGNOSIS — K7689 Other specified diseases of liver: Secondary | ICD-10-CM | POA: Diagnosis present

## 2012-08-13 DIAGNOSIS — K219 Gastro-esophageal reflux disease without esophagitis: Secondary | ICD-10-CM | POA: Diagnosis present

## 2012-08-13 DIAGNOSIS — Z23 Encounter for immunization: Secondary | ICD-10-CM

## 2012-08-13 DIAGNOSIS — Y840 Cardiac catheterization as the cause of abnormal reaction of the patient, or of later complication, without mention of misadventure at the time of the procedure: Secondary | ICD-10-CM | POA: Diagnosis present

## 2012-08-13 DIAGNOSIS — R911 Solitary pulmonary nodule: Secondary | ICD-10-CM | POA: Diagnosis present

## 2012-08-13 DIAGNOSIS — I440 Atrioventricular block, first degree: Secondary | ICD-10-CM | POA: Diagnosis present

## 2012-08-13 DIAGNOSIS — Z79899 Other long term (current) drug therapy: Secondary | ICD-10-CM

## 2012-08-13 DIAGNOSIS — M810 Age-related osteoporosis without current pathological fracture: Secondary | ICD-10-CM | POA: Diagnosis present

## 2012-08-13 DIAGNOSIS — Z7902 Long term (current) use of antithrombotics/antiplatelets: Secondary | ICD-10-CM

## 2012-08-13 DIAGNOSIS — I214 Non-ST elevation (NSTEMI) myocardial infarction: Principal | ICD-10-CM | POA: Diagnosis present

## 2012-08-13 DIAGNOSIS — Z8249 Family history of ischemic heart disease and other diseases of the circulatory system: Secondary | ICD-10-CM

## 2012-08-13 DIAGNOSIS — T82897A Other specified complication of cardiac prosthetic devices, implants and grafts, initial encounter: Secondary | ICD-10-CM | POA: Diagnosis present

## 2012-08-13 DIAGNOSIS — Y92009 Unspecified place in unspecified non-institutional (private) residence as the place of occurrence of the external cause: Secondary | ICD-10-CM

## 2012-08-13 DIAGNOSIS — I10 Essential (primary) hypertension: Secondary | ICD-10-CM | POA: Diagnosis present

## 2012-08-13 DIAGNOSIS — I4891 Unspecified atrial fibrillation: Secondary | ICD-10-CM | POA: Diagnosis present

## 2012-08-13 DIAGNOSIS — I2510 Atherosclerotic heart disease of native coronary artery without angina pectoris: Secondary | ICD-10-CM | POA: Diagnosis present

## 2012-08-13 DIAGNOSIS — D126 Benign neoplasm of colon, unspecified: Secondary | ICD-10-CM | POA: Diagnosis present

## 2012-08-13 DIAGNOSIS — G4733 Obstructive sleep apnea (adult) (pediatric): Secondary | ICD-10-CM | POA: Diagnosis present

## 2012-08-13 NOTE — ED Notes (Signed)
Pt c/o burning type left chest pain radiating into left arm and left side of face.  Pt was seen here for same on 3/8.  Onset tonight while walking up steps.

## 2012-08-14 ENCOUNTER — Encounter (HOSPITAL_COMMUNITY)
Admission: EM | Disposition: A | Discharge status: Home / Self Care | Payer: Self-pay | Source: Home / Self Care | Attending: Cardiovascular Disease

## 2012-08-14 ENCOUNTER — Emergency Department (HOSPITAL_COMMUNITY): Payer: Medicare Other

## 2012-08-14 ENCOUNTER — Encounter (HOSPITAL_COMMUNITY): Payer: Self-pay | Admitting: Radiology

## 2012-08-14 ENCOUNTER — Inpatient Hospital Stay (HOSPITAL_COMMUNITY)
Admission: EM | Admit: 2012-08-14 | Discharge: 2012-08-15 | DRG: 247 | Disposition: A | Discharge status: Home / Self Care | Payer: Medicare Other | Attending: Cardiovascular Disease | Admitting: Cardiovascular Disease

## 2012-08-14 DIAGNOSIS — I214 Non-ST elevation (NSTEMI) myocardial infarction: Secondary | ICD-10-CM

## 2012-08-14 DIAGNOSIS — Z955 Presence of coronary angioplasty implant and graft: Secondary | ICD-10-CM

## 2012-08-14 DIAGNOSIS — I480 Paroxysmal atrial fibrillation: Secondary | ICD-10-CM | POA: Diagnosis present

## 2012-08-14 DIAGNOSIS — I10 Essential (primary) hypertension: Secondary | ICD-10-CM | POA: Diagnosis present

## 2012-08-14 DIAGNOSIS — I252 Old myocardial infarction: Secondary | ICD-10-CM | POA: Diagnosis present

## 2012-08-14 DIAGNOSIS — R079 Chest pain, unspecified: Secondary | ICD-10-CM

## 2012-08-14 DIAGNOSIS — E785 Hyperlipidemia, unspecified: Secondary | ICD-10-CM | POA: Diagnosis present

## 2012-08-14 DIAGNOSIS — I2510 Atherosclerotic heart disease of native coronary artery without angina pectoris: Secondary | ICD-10-CM | POA: Diagnosis present

## 2012-08-14 DIAGNOSIS — I4891 Unspecified atrial fibrillation: Secondary | ICD-10-CM

## 2012-08-14 HISTORY — DX: Solitary pulmonary nodule: R91.1

## 2012-08-14 HISTORY — PX: LEFT HEART CATHETERIZATION WITH CORONARY ANGIOGRAM: SHX5451

## 2012-08-14 HISTORY — DX: Acute myocardial infarction, unspecified: I21.9

## 2012-08-14 HISTORY — PX: CORONARY ANGIOPLASTY WITH STENT PLACEMENT: SHX49

## 2012-08-14 HISTORY — DX: Paroxysmal atrial fibrillation: I48.0

## 2012-08-14 HISTORY — DX: Unspecified osteoarthritis, unspecified site: M19.90

## 2012-08-14 LAB — COMPREHENSIVE METABOLIC PANEL
ALT: 25 U/L (ref 0–35)
ALT: 20 U/L (ref 0–35)
AST: 23 U/L (ref 0–37)
Albumin: 4.4 g/dL (ref 3.5–5.2)
Alkaline Phosphatase: 70 U/L (ref 39–117)
Alkaline Phosphatase: 61 U/L (ref 39–117)
BUN: 16 mg/dL (ref 6–23)
CO2: 24 mEq/L (ref 19–32)
Calcium: 8 mg/dL — ABNORMAL LOW (ref 8.4–10.5)
Chloride: 100 mEq/L (ref 96–112)
Chloride: 106 mEq/L (ref 96–112)
GFR calc Af Amer: >90 mL/min (ref >90)
GFR calc Af Amer: >90 mL/min (ref >90)
GFR calc non Af Amer: 89 mL/min — ABNORMAL LOW (ref >90)
Glucose, Bld: 110 mg/dL — ABNORMAL HIGH (ref 70–99)
Glucose, Bld: 112 mg/dL — ABNORMAL HIGH (ref 70–99)
Potassium: 3.6 mEq/L (ref 3.5–5.1)
Potassium: 3.9 mEq/L (ref 3.5–5.1)
Sodium: 138 mEq/L (ref 135–145)
Sodium: 141 mEq/L (ref 135–145)
Total Bilirubin: 0.6 mg/dL (ref 0.3–1.2)
Total Bilirubin: 0.7 mg/dL (ref 0.3–1.2)
Total Protein: 7.6 g/dL (ref 6.0–8.3)

## 2012-08-14 LAB — CBC WITH DIFFERENTIAL/PLATELET
Eosinophils Absolute: 0.1 10*3/uL (ref 0.0–0.7)
Hemoglobin: 15.7 g/dL — ABNORMAL HIGH (ref 12.0–15.0)
Lymphocytes Relative: 31 % (ref 12–46)
Lymphs Abs: 1.9 10*3/uL (ref 0.7–4.0)
Monocytes Relative: 7 % (ref 3–12)
Neutro Abs: 3.7 10*3/uL (ref 1.7–7.7)
Neutrophils Relative %: 60 % (ref 43–77)
Platelets: 190 10*3/uL (ref 150–400)
RBC: 5.08 MIL/uL (ref 3.87–5.11)
WBC: 6.2 10*3/uL (ref 4.0–10.5)

## 2012-08-14 LAB — POCT I-STAT TROPONIN I: Troponin i, poc: 0.11 ng/mL (ref 0.00–0.08)

## 2012-08-14 LAB — POCT ACTIVATED CLOTTING TIME: Activated Clotting Time: 459 seconds

## 2012-08-14 LAB — TROPONIN I
Troponin I: 0.38 ng/mL (ref <0.30)
Troponin I: 0.33 ng/mL (ref <0.30)

## 2012-08-14 LAB — PROTIME-INR
INR: 1.04 (ref 0.00–1.49)
Prothrombin Time: 13.5 seconds (ref 11.6–15.2)

## 2012-08-14 LAB — LIPASE, BLOOD: Lipase: 38 U/L (ref 11–59)

## 2012-08-14 SURGERY — LEFT HEART CATHETERIZATION WITH CORONARY ANGIOGRAM
Anesthesia: LOCAL

## 2012-08-14 MED ORDER — MIDAZOLAM HCL 2 MG/2ML IJ SOLN
INTRAMUSCULAR | Status: AC
  Filled 2012-08-14: qty 2

## 2012-08-14 MED ORDER — VERAPAMIL HCL 2.5 MG/ML IV SOLN
INTRAVENOUS | Status: AC
  Filled 2012-08-14: qty 2

## 2012-08-14 MED ORDER — SODIUM CHLORIDE 0.9 % IV SOLN: 250.0000 mL | INTRAVENOUS | Status: DC | PRN

## 2012-08-14 MED ORDER — SODIUM CHLORIDE 0.9 % IJ SOLN: 3.0000 mL | Freq: Two times a day (BID) | INTRAMUSCULAR | Status: DC

## 2012-08-14 MED ORDER — HEPARIN (PORCINE) IN NACL 100-0.45 UNIT/ML-% IJ SOLN
750.0000 [IU]/h | INTRAMUSCULAR | Status: DC
  Administered 2012-08-14: 750 [IU]/h via INTRAVENOUS
  Filled 2012-08-14: qty 250

## 2012-08-14 MED ORDER — MORPHINE SULFATE 2 MG/ML IJ SOLN: 2.0000 mg | INTRAMUSCULAR | Status: DC | PRN

## 2012-08-14 MED ORDER — MORPHINE SULFATE 4 MG/ML IJ SOLN
INTRAMUSCULAR | Status: AC
  Administered 2012-08-14: 01:00:00
  Filled 2012-08-14: qty 1

## 2012-08-14 MED ORDER — NITROGLYCERIN 0.4 MG SL SUBL
SUBLINGUAL_TABLET | SUBLINGUAL | Status: AC
  Administered 2012-08-14: 01:00:00
  Filled 2012-08-14: qty 25

## 2012-08-14 MED ORDER — AMLODIPINE BESYLATE 5 MG PO TABS
5.0000 mg | ORAL_TABLET | Freq: Every day | ORAL | Status: DC
  Filled 2012-08-14 (×2): qty 1

## 2012-08-14 MED ORDER — SODIUM CHLORIDE 0.9 % IJ SOLN: 3.0000 mL | INTRAMUSCULAR | Status: DC | PRN

## 2012-08-14 MED ORDER — ASPIRIN 81 MG PO CHEW: 81.0000 mg | CHEWABLE_TABLET | Freq: Every day | ORAL | Status: DC

## 2012-08-14 MED ORDER — HEPARIN BOLUS VIA INFUSION
3000.0000 [IU] | Freq: Once | INTRAVENOUS | Status: AC
  Administered 2012-08-14: 3000 [IU] via INTRAVENOUS

## 2012-08-14 MED ORDER — NITROGLYCERIN 0.4 MG SL SUBL: 0.4000 mg | SUBLINGUAL_TABLET | SUBLINGUAL | Status: DC | PRN

## 2012-08-14 MED ORDER — PANTOPRAZOLE SODIUM 40 MG PO TBEC
40.0000 mg | DELAYED_RELEASE_TABLET | Freq: Every day | ORAL | Status: DC
  Administered 2012-08-14: 15:00:00 40 mg via ORAL
  Filled 2012-08-14: qty 1

## 2012-08-14 MED ORDER — ACETAMINOPHEN 325 MG PO TABS: 650.0000 mg | ORAL_TABLET | ORAL | Status: DC | PRN

## 2012-08-14 MED ORDER — ASPIRIN EC 81 MG PO TBEC
81.0000 mg | DELAYED_RELEASE_TABLET | Freq: Every day | ORAL | Status: DC
  Administered 2012-08-15: 81 mg via ORAL
  Filled 2012-08-14: qty 1

## 2012-08-14 MED ORDER — ATENOLOL 25 MG PO TABS
25.0000 mg | ORAL_TABLET | Freq: Every day | ORAL | Status: DC
  Administered 2012-08-15: 25 mg via ORAL
  Filled 2012-08-14 (×3): qty 1

## 2012-08-14 MED ORDER — SODIUM CHLORIDE 0.9 % IV SOLN
INTRAVENOUS | Status: DC
  Administered 2012-08-14: 10:00:00 via INTRAVENOUS

## 2012-08-14 MED ORDER — SODIUM CHLORIDE 0.9 % IV SOLN: 1.0000 mL/kg/h | INTRAVENOUS | Status: AC

## 2012-08-14 MED ORDER — PROPAFENONE HCL 150 MG PO TABS
150.0000 mg | ORAL_TABLET | Freq: Two times a day (BID) | ORAL | Status: DC
  Administered 2012-08-14 – 2012-08-15 (×2): 150 mg via ORAL
  Filled 2012-08-14 (×5): qty 1

## 2012-08-14 MED ORDER — NITROGLYCERIN 1 MG/10 ML FOR IR/CATH LAB
INTRA_ARTERIAL | Status: AC
  Filled 2012-08-14: qty 10

## 2012-08-14 MED ORDER — TICAGRELOR 90 MG PO TABS
ORAL_TABLET | ORAL | Status: AC
  Administered 2012-08-14: 90 mg via ORAL
  Filled 2012-08-14: qty 2

## 2012-08-14 MED ORDER — PNEUMOCOCCAL VAC POLYVALENT 25 MCG/0.5ML IJ INJ
0.5000 mL | INJECTION | INTRAMUSCULAR | Status: AC
  Administered 2012-08-15: 0.5 mL via INTRAMUSCULAR
  Filled 2012-08-14: qty 0.5

## 2012-08-14 MED ORDER — BIVALIRUDIN 250 MG IV SOLR
INTRAVENOUS | Status: AC
  Filled 2012-08-14: qty 250

## 2012-08-14 MED ORDER — LIDOCAINE HCL (PF) 1 % IJ SOLN
INTRAMUSCULAR | Status: AC
  Filled 2012-08-14: qty 30

## 2012-08-14 MED ORDER — ASPIRIN 81 MG PO CHEW: 324.0000 mg | CHEWABLE_TABLET | ORAL | Status: DC

## 2012-08-14 MED ORDER — IOHEXOL 350 MG/ML SOLN
100.0000 mL | Freq: Once | INTRAVENOUS | Status: AC | PRN
  Administered 2012-08-14: 100 mL via INTRAVENOUS

## 2012-08-14 MED ORDER — NITROGLYCERIN IN D5W 200-5 MCG/ML-% IV SOLN
5.0000 ug/min | INTRAVENOUS | Status: DC
  Administered 2012-08-14: 5 ug/min via INTRAVENOUS
  Administered 2012-08-14: 20 ug/min via INTRAVENOUS
  Filled 2012-08-14: qty 250

## 2012-08-14 MED ORDER — HEPARIN (PORCINE) IN NACL 2-0.9 UNIT/ML-% IJ SOLN
INTRAMUSCULAR | Status: AC
  Filled 2012-08-14: qty 1000

## 2012-08-14 MED ORDER — TICAGRELOR 90 MG PO TABS
90.0000 mg | ORAL_TABLET | Freq: Two times a day (BID) | ORAL | Status: DC
  Administered 2012-08-15: 11:00:00 90 mg via ORAL
  Filled 2012-08-14 (×4): qty 1

## 2012-08-14 MED ORDER — FENTANYL CITRATE 0.05 MG/ML IJ SOLN
INTRAMUSCULAR | Status: AC
  Filled 2012-08-14: qty 2

## 2012-08-14 MED ORDER — ONDANSETRON HCL 4 MG/2ML IJ SOLN
4.0000 mg | Freq: Four times a day (QID) | INTRAMUSCULAR | Status: DC | PRN
  Administered 2012-08-14: 15:00:00 4 mg via INTRAVENOUS
  Filled 2012-08-14: qty 2

## 2012-08-14 MED ORDER — HEPARIN SODIUM (PORCINE) 1000 UNIT/ML IJ SOLN
INTRAMUSCULAR | Status: AC
  Filled 2012-08-14: qty 1

## 2012-08-14 MED ORDER — OXYCODONE-ACETAMINOPHEN 5-325 MG PO TABS: 1.0000 | ORAL_TABLET | ORAL | Status: DC | PRN

## 2012-08-14 NOTE — H&P (Addendum)
Shannon Cummings is an 67 y.o. female.  Chief Complaint: Chest Pain  HPI: 67 yo woman with PMH of hypertension, dyslipidemia, paroxysmal atrial fibrillation, CAD with prior stent, OSA on CPAP who came to the ER with left-sided chest pain with radiation to her jaw and to her back between her shoulders along with her left arm. Pain began at 20:00 this evening and progressed with walking/exertion leading to presentation. The pain is a heavy sensation with associated jaw, arm, shoulder pain. She actually came to the ER on Saturday with negative workout but was told to return if he had continued symptoms, which she has and now returns. She felt well on Monday but Tuesday evening she had 45 minutes duration of pain before it started to ease up leading to presentation. In the ER, she had a CT dissection protocol given some radiation to her back/shoulder blades which was negative for dissection, she was started on heparin and aspirin.  Past Medical History   Diagnosis  Date   .  OSA on CPAP    .  Palpitation    .  Hyperlipidemia    .  Hypertension    .  Atrial fibrillation    .  Tubular adenoma of colon  2007   .  CAD (coronary artery disease)    .  Osteoporosis    .  Fatty infiltration of liver    .  GERD (gastroesophageal reflux disease)     Past Surgical History   Procedure  Laterality  Date   .  Total abdominal hysterectomy     .  Tonsillectomy     .  Coronary stent placement   2000    Family History   Problem  Relation  Age of Onset   .  Breast cancer  Maternal Aunt    .  Heart disease  Father    .  Alzheimer's disease  Mother    .  Colon cancer  Neg Hx     Social History: reports that she has never smoked. She has never used smokeless tobacco. She reports that drinks alcohol. She reports that she does not use illicit drugs.  Allergies:  Allergies   Allergen  Reactions   .  Statins      Too many side effects to want to take medication     (Not in a hospital admission)  Results for  orders placed during the hospital encounter of 08/14/12 (from the past 48 hour(s))   CBC WITH DIFFERENTIAL Status: Abnormal    Collection Time    08/13/12 11:57 PM   Result  Value  Range    WBC  6.2  4.0 - 10.5 K/uL    RBC  5.08  3.87 - 5.11 MIL/uL    Hemoglobin  15.7 (*)  12.0 - 15.0 g/dL    HCT  16.1  09.6 - 04.5 %    MCV  86.8  78.0 - 100.0 fL    MCH  30.9  26.0 - 34.0 pg    MCHC  35.6  30.0 - 36.0 g/dL    RDW  40.9  81.1 - 91.4 %    Platelets  190  150 - 400 K/uL    Neutrophils Relative  60  43 - 77 %    Neutro Abs  3.7  1.7 - 7.7 K/uL    Lymphocytes Relative  31  12 - 46 %    Lymphs Abs  1.9  0.7 - 4.0 K/uL    Monocytes  Relative  7  3 - 12 %    Monocytes Absolute  0.4  0.1 - 1.0 K/uL    Eosinophils Relative  2  0 - 5 %    Eosinophils Absolute  0.1  0.0 - 0.7 K/uL    Basophils Relative  0  0 - 1 %    Basophils Absolute  0.0  0.0 - 0.1 K/uL   COMPREHENSIVE METABOLIC PANEL Status: Abnormal    Collection Time    08/13/12 11:57 PM   Result  Value  Range    Sodium  138  135 - 145 mEq/L    Potassium  3.6  3.5 - 5.1 mEq/L    Chloride  100  96 - 112 mEq/L    CO2  24  19 - 32 mEq/L    Glucose, Bld  110 (*)  70 - 99 mg/dL    BUN  16  6 - 23 mg/dL    Creatinine, Ser  8.41  0.50 - 1.10 mg/dL    Calcium  9.5  8.4 - 10.5 mg/dL    Total Protein  7.6  6.0 - 8.3 g/dL    Albumin  4.4  3.5 - 5.2 g/dL    AST  25  0 - 37 U/L    ALT  25  0 - 35 U/L    Alkaline Phosphatase  70  39 - 117 U/L    Total Bilirubin  0.6  0.3 - 1.2 mg/dL    GFR calc non Af Amer  87 (*)  >90 mL/min    GFR calc Af Amer  >90  >90 mL/min    Comment:      The eGFR has been calculated     using the CKD EPI equation.     This calculation has not been     validated in all clinical     situations.     eGFR's persistently     <90 mL/min signify     possible Chronic Kidney Disease.   LIPASE, BLOOD Status: None    Collection Time    08/13/12 11:57 PM   Result  Value  Range    Lipase  38  11 - 59 U/L   TROPONIN I  Status: Abnormal    Collection Time    08/13/12 11:57 PM   Result  Value  Range    Troponin I  0.38 (*)  <0.30 ng/mL    Comment:  CRITICAL RESULT CALLED TO, READ BACK BY AND VERIFIED WITH:     J.OBERPHAOER,RN 0208 08/14/12 M.CAMPBELL         Due to the release kinetics of cTnI,     a negative result within the first hours     of the onset of symptoms does not rule out     myocardial infarction with certainty.     If myocardial infarction is still suspected,     repeat the test at appropriate intervals.   POCT I-STAT TROPONIN I Status: Abnormal    Collection Time    08/14/12 12:16 AM   Result  Value  Range    Troponin i, poc  0.11 (*)  0.00 - 0.08 ng/mL    Comment  NOTIFIED PHYSICIAN     Comment 3      Comment:  Due to the release kinetics of cTnI,     a negative result within the first hours     of the onset of symptoms does not rule out  myocardial infarction with certainty.     If myocardial infarction is still suspected,     repeat the test at appropriate intervals.   PROTIME-INR Status: None    Collection Time    08/14/12 1:25 AM   Result  Value  Range    Prothrombin Time  13.5  11.6 - 15.2 seconds    INR  1.04  0.00 - 1.49   SAMPLE TO BLOOD BANK Status: None    Collection Time    08/14/12 1:25 AM   Result  Value  Range    Blood Bank Specimen  SAMPLE AVAILABLE FOR TESTING     Sample Expiration  08/15/2012     Dg Chest Port 1 View  08/14/2012 *RADIOLOGY REPORT* Clinical Data: Chest pain, shortness of breath tonight PORTABLE CHEST ONE-VIEW: Comparison: Portable exam 0105 hours without priors for comparison Findings: Normal heart size and pulmonary vascularity. Tortuous aorta. Lungs clear. No pleural effusion or pneumothorax. No acute osseous findings. IMPRESSION: No acute abnormalities. *RADIOLOGY REPORT* Clinical Data: Chest pain, shortness of breath tonight PORTABLE CHEST ONE-VIEW: Comparison: Portable exam 0105 hours without priors for comparison Findings: Normal heart  size and pulmonary vascularity. Tortuous aorta. Lungs clear. No pleural effusion or pneumothorax. No acute osseous findings. IMPRESSION: No acute abnormalities. Original Report Authenticated By: Ulyses Southward, M.D.  Ct Angio Chest Aortic Dissect W &/or W/o  08/14/2012 *RADIOLOGY REPORT* Clinical Data: Chest pain CT ANGIOGRAPHY CHEST, ABDOMEN AND PELVIS Technique: Multidetector CT imaging through the chest, abdomen and pelvis was performed using the standard protocol during bolus administration of intravenous contrast. Multiplanar reconstructed images including MIPs were obtained and reviewed to evaluate the vascular anatomy. Contrast: OMNIPAQUE IOHEXOL 350 MG/ML SOLN Comparison: 11/11/2008 abdominal MRI CTA CHEST Findings: Normal caliber aorta and branch vessels. No aneurysmal dilatation or dissection. Main pulmonary arterial branches are patent. Heart size upper normal to mildly enlarged. No intrathoracic lymphadenopathy. No pleural or pericardial effusion. Central airways are patent. Mild bibasilar atelectasis or scarring. Biapical scarring. 8 mm subpleural nodular density right upper lobe posteriorly on series 6 image 19. No confluent airspace opacity. No pneumothorax. Review of the MIP images confirms the above findings. IMPRESSION: No aortic dissection or acute intrathoracic process. Heart size upper normal to mildly enlarged. 8 mm subpleural nodular density within the right upper lobe. If the patient is at high risk for bronchogenic carcinoma, follow-up chest CT at 3-6 months is recommended. If the patient is at low risk for bronchogenic carcinoma, follow-up chest CT at 6-12 months is recommended. This recommendation follows the consensus statement: Guidelines for Management of Small Pulmonary Nodules Detected on CT Scans: A Statement from the Fleischner Society as published in Radiology 2005; 237:395-400. CTA ABDOMEN AND PELVIS Findings: Intra-abdominal organ evaluation is limited in the arterial phase.  Question hepatic steatosis. Unremarkable biliary system, spleen, pancreas, adrenal glands. There are a couple too small to further characterize renal hypodensities. No hydronephrosis or hydroureter. No CT evidence for colitis. Colonic diverticulosis. Normal appendix. Small bowel loops are normal course and caliber. Thin-walled bladder. Absent uterus. No adnexal mass. Normal caliber abdominal aorta. Patent celiac axis, SMA, IMA, and iliac vessels. Mild scattered atherosclerotic disease. No acute osseous finding. Multi lumbar degenerative disc disease. Review of the MIP images confirms the above findings. IMPRESSION: Mild scattered atherosclerotic disease of the abdominal aorta and branch vessels. No aneurysmal dilatation or dissection. No acute abdominopelvic process identified by CT. Original Report Authenticated By: Jearld Lesch, M.D.  Ct Angio Abd/pel W/ And/or W/o  08/14/2012 *RADIOLOGY REPORT*  Clinical Data: Chest pain CT ANGIOGRAPHY CHEST, ABDOMEN AND PELVIS Technique: Multidetector CT imaging through the chest, abdomen and pelvis was performed using the standard protocol during bolus administration of intravenous contrast. Multiplanar reconstructed images including MIPs were obtained and reviewed to evaluate the vascular anatomy. Contrast: OMNIPAQUE IOHEXOL 350 MG/ML SOLN Comparison: 11/11/2008 abdominal MRI CTA CHEST Findings: Normal caliber aorta and branch vessels. No aneurysmal dilatation or dissection. Main pulmonary arterial branches are patent. Heart size upper normal to mildly enlarged. No intrathoracic lymphadenopathy. No pleural or pericardial effusion. Central airways are patent. Mild bibasilar atelectasis or scarring. Biapical scarring. 8 mm subpleural nodular density right upper lobe posteriorly on series 6 image 19. No confluent airspace opacity. No pneumothorax. Review of the MIP images confirms the above findings. IMPRESSION: No aortic dissection or acute intrathoracic process. Heart  size upper normal to mildly enlarged. 8 mm subpleural nodular density within the right upper lobe. If the patient is at high risk for bronchogenic carcinoma, follow-up chest CT at 3-6 months is recommended. If the patient is at low risk for bronchogenic carcinoma, follow-up chest CT at 6-12 months is recommended. This recommendation follows the consensus statement: Guidelines for Management of Small Pulmonary Nodules Detected on CT Scans: A Statement from the Fleischner Society as published in Radiology 2005; 237:395-400. CTA ABDOMEN AND PELVIS Findings: Intra-abdominal organ evaluation is limited in the arterial phase. Question hepatic steatosis. Unremarkable biliary system, spleen, pancreas, adrenal glands. There are a couple too small to further characterize renal hypodensities. No hydronephrosis or hydroureter. No CT evidence for colitis. Colonic diverticulosis. Normal appendix. Small bowel loops are normal course and caliber. Thin-walled bladder. Absent uterus. No adnexal mass. Normal caliber abdominal aorta. Patent celiac axis, SMA, IMA, and iliac vessels. Mild scattered atherosclerotic disease. No acute osseous finding. Multi lumbar degenerative disc disease. Review of the MIP images confirms the above findings. IMPRESSION: Mild scattered atherosclerotic disease of the abdominal aorta and branch vessels. No aneurysmal dilatation or dissection. No acute abdominopelvic process identified by CT. Original Report Authenticated By: Jearld Lesch, M.D.   Review of Systems  Constitutional: Negative for fever, chills and weight loss.  HENT: Negative for hearing loss and tinnitus.  Eyes: Negative for double vision, photophobia and pain.  Respiratory: Negative for cough, hemoptysis and sputum production.  Cardiovascular: Positive for chest pain. Negative for palpitations, orthopnea and claudication.  Gastrointestinal: Negative for heartburn, nausea, vomiting and abdominal pain.  Genitourinary: Negative for  dysuria, urgency and frequency.  Musculoskeletal: Positive for back pain. Negative for myalgias, joint pain and falls.  Skin: Positive for rash. Negative for itching.  Neurological: Negative for dizziness, tingling, tremors and headaches.  Endo/Heme/Allergies: Negative for environmental allergies. Does not bruise/bleed easily.  Psychiatric/Behavioral: Negative for depression, suicidal ideas and substance abuse.   Blood pressure 131/94, pulse 71, temperature 97.5 F (36.4 C), temperature source Oral, resp. rate 16, height 5' 1.02" (1.55 m), weight 62.596 kg (138 lb), SpO2 99.00%.  Physical Exam  Nursing note and vitals reviewed.  Constitutional: She is oriented to person, place, and time. She appears well-developed and well-nourished. No distress.  HENT:  Head: Normocephalic and atraumatic.  Nose: Nose normal.  Mouth/Throat: Oropharynx is clear and moist. No oropharyngeal exudate.  Eyes: Conjunctivae and EOM are normal. Pupils are equal, round, and reactive to light. No scleral icterus.  Neck: Normal range of motion. Neck supple. No JVD present. No tracheal deviation present. No thyromegaly present.  Cardiovascular: Normal rate, regular rhythm, normal heart sounds and intact distal pulses. Exam reveals  no gallop and no friction rub.  No murmur heard.  Respiratory: Effort normal and breath sounds normal. No respiratory distress. She has no wheezes.  GI: Soft. Bowel sounds are normal. She exhibits no distension. There is no tenderness.  Musculoskeletal: Normal range of motion. She exhibits no edema and no tenderness.  Neurological: She is alert and oriented to person, place, and time. No cranial nerve deficit. Coordination normal.  Skin: Skin is warm and dry. No rash noted. She is not diaphoretic. No erythema.  Psychiatric: She has a normal mood and affect. Her behavior is normal.   Labs reviewed; wbc 6.2, h/h 15.7/44.1, plt 190, na 138, K 3.6, bun/cr 16/0.73, troponin 0.38  ECG reviewed;  sinus, 1st degree AVB, prolonged QT  Echo reviewed from 2007; EF 55-60%  Problem List  Chest Pain  NSTEMI  Known CAD  Assessment/Plan  67 yo woman with chest pain, known prior CAD here with Nstemi. Differential diagnosis is musculoskeletal, supply/demand and NSTEMI. I favor actual NSTEMI given the history, known prior CAD and quality of the pain. She's on aspirin/heparin with improved to nearly resolved pain (along with nitro gtt). Will plan for hopeful LHC later today.  - NPO after MN for LHC  - aspirin 81 mg, heparin gtt  - nitro gtt  - update lipid panel, hba1c, tsh, BNP  - Echo in AM  - PPI as able  - low dose beta blocker  - holding statin due to intolerance    KELLY, JACOB  08/14/2012, 4:14 AM  [facility] addendum: The above note was written by Leeann Must, MD.  It was not signed and was listed as incomplete.  I have read the note and agree with the findings and have co-signed it so it appears as a current note.  Pt will be cathed this am for NSTEMI.  Vesta Mixer, Montez Hageman., MD, [facility] 08/14/2012, 9:58 AM Office - [phone] Pager [phone]

## 2012-08-14 NOTE — Progress Notes (Signed)
Call received that pt will not be returning to 2900. All personal belongings delivered to cath lab to be transported with pt to new room.

## 2012-08-14 NOTE — ED Notes (Signed)
RN on 2900 to call back for report

## 2012-08-14 NOTE — ED Notes (Signed)
Pt. Reports sudden onset left chest pain radiating to left arm and jaw and back. Reports 2 episodes tonight lasting x45 min. Hx of same. Hx of stent placement

## 2012-08-14 NOTE — Progress Notes (Signed)
ANTICOAGULATION CONSULT NOTE - Initial Consult  Pharmacy Consult for Heparin Indication: chest pain/ACS  Allergies  Allergen Reactions  . Statins     Too many side effects to want to take medication    Patient Measurements: Height: 5' 1.02" (155 cm) Weight: 138 lb (62.596 kg) IBW/kg (Calculated) : 47.86   Vital Signs: Temp: 97.5 F (36.4 C) (03/11 2356) Temp src: Oral (03/11 2356) BP: 131/94 mmHg (03/11 2356) Pulse Rate: 71 (03/11 2356)  Labs:  Recent Labs  08/13/12 2357 08/14/12 0125  HGB 15.7*  --   HCT 44.1  --   PLT 190  --   LABPROT  --  13.5  INR  --  1.04  CREATININE 0.73  --   TROPONINI 0.38*  --     Estimated Creatinine Clearance: 58.8 ml/min (by C-G formula based on Cr of 0.73).   Medical History: Past Medical History  Diagnosis Date  . OSA on CPAP   . Palpitation   . Hyperlipidemia   . Hypertension   . Atrial fibrillation   . Tubular adenoma of colon 2007  . CAD (coronary artery disease)   . Osteoporosis   . Fatty infiltration of liver   . GERD (gastroesophageal reflux disease)     Medications:  Norvasc  ASA  Atenolol  Vit D  Estrace  Prilosec  Propafenone  Assessment: 67 yo female with chest pain for heparin Goal of Therapy:  Heparin level 0.3-0.7 units/ml Monitor platelets by anticoagulation protocol: Yes   Plan:  Heparin 3000 units IV bolus, then 750 units/hr Check heparin level in 6 hours.  Eddie Candle 08/14/2012,3:48 AM

## 2012-08-14 NOTE — ED Provider Notes (Signed)
History     CSN: [id number]  Arrival date & time 08/13/12  2347   First MD Initiated Contact with Patient 08/14/12 0136      Chief Complaint  Patient presents with  . Chest Pain    (Consider location/radiation/quality/duration/timing/severity/associated sxs/prior treatment) HPI 67 year old female presents to emergency room complaining of left sided chest pain with radiation into her jaw, in between her shoulder blades, and left arm.  Patient describes pain as a deep ache in a burning.  Pain started tonight around 8:00, worsened at 10, when she walked up the stairs.  Pain has been severe.  Patient has history of coronary disease, status post stent in 2010.  Patient was having some pain over the last several days, was seen in the emergency department on Saturday, had workup that was unrevealing at that time, and was offered admission, which she declined. Pt c/o severe headache at this time, reports she does not normally have headaches.   Past Medical History  Diagnosis Date  . OSA on CPAP   . Palpitation   . Hyperlipidemia   . Hypertension   . Atrial fibrillation   . Tubular adenoma of colon 2007  . CAD (coronary artery disease)   . Osteoporosis   . Fatty infiltration of liver   . GERD (gastroesophageal reflux disease)     Past Surgical History  Procedure Laterality Date  . Total abdominal hysterectomy    . Tonsillectomy    . Coronary stent placement  2000    Family History  Problem Relation Age of Onset  . Breast cancer Maternal Aunt   . Heart disease Father   . Alzheimer's disease Mother   . Colon cancer Neg Hx     History  Substance Use Topics  . Smoking status: Never Smoker   . Smokeless tobacco: Never Used  . Alcohol Use: Yes     Comment: 2 drinks daily     OB History   Grav Para Term Preterm Abortions TAB SAB Ect Mult Living                  Review of Systems  All other systems reviewed and are negative.    Allergies  Statins  Home Medications    Current Outpatient Rx  Name  Route  Sig  Dispense  Refill  . amLODipine (NORVASC) 5 MG tablet   Oral   Take 5 mg by mouth at bedtime.         Marland Kitchen aspirin 325 MG tablet   Oral   Take 325 mg by mouth at bedtime.          Marland Kitchen atenolol (TENORMIN) 25 MG tablet   Oral   Take 25 mg by mouth at bedtime.         . cholecalciferol (VITAMIN D) 1000 UNITS tablet   Oral   Take 1,000 Units by mouth daily.         Marland Kitchen omeprazole (PRILOSEC) 20 MG capsule   Oral   Take 20 mg by mouth daily.         . propafenone (RYTHMOL) 150 MG tablet      TAKE 1 TABLET BY MOUTH TWICE A DAY   180 tablet   1   . AMBULATORY NON FORMULARY MEDICATION      CPAP Machine USES AS DIRECTED         . estradiol (ESTRACE) 0.1 MG/GM vaginal cream   Vaginal   Place 2 g vaginally 2 (two) times a week.  BP 131/94  Pulse 71  Temp(Src) 97.5 F (36.4 C) (Oral)  Resp 16  SpO2 99%  Physical Exam  Nursing note and vitals reviewed. Constitutional: She is oriented to person, place, and time. She appears distressed.  HENT:  Head: Normocephalic and atraumatic.  Nose: Nose normal.  Mouth/Throat: Oropharynx is clear and moist.  Eyes: Conjunctivae and EOM are normal.  Neck: Normal range of motion. Neck supple. No JVD present. No tracheal deviation present. No thyromegaly present.  Cardiovascular: Normal rate, regular rhythm, normal heart sounds and intact distal pulses.  Exam reveals no gallop and no friction rub.   No murmur heard. Pulmonary/Chest: Effort normal and breath sounds normal. No stridor. No respiratory distress. She has no wheezes. She has no rales. She exhibits no tenderness.  Abdominal: Soft. Bowel sounds are normal. She exhibits no distension and no mass. There is no tenderness. There is no rebound and no guarding.  Musculoskeletal: Normal range of motion. She exhibits no edema and no tenderness.  Lymphadenopathy:    She has no cervical adenopathy.  Neurological: She is alert and  oriented to person, place, and time. She exhibits normal muscle tone. Coordination normal.  Skin: Skin is warm and dry. No rash noted. No erythema. No pallor.  Psychiatric: She has a normal mood and affect. Her behavior is normal. Judgment and thought content normal.    ED Course  Procedures (including critical care time)  CRITICAL CARE Performed by: Olivia Mackie   Total critical care time: 60 min  Critical care time was exclusive of separately billable procedures and treating other patients.  Critical care was necessary to treat or prevent imminent or life-threatening deterioration.  Critical care was time spent personally by me on the following activities: development of treatment plan with patient and/or surrogate as well as nursing, discussions with consultants, evaluation of patient's response to treatment, examination of patient, obtaining history from patient or surrogate, ordering and performing treatments and interventions, ordering and review of laboratory studies, ordering and review of radiographic studies, pulse oximetry and re-evaluation of patient's condition.   Labs Reviewed  CBC WITH DIFFERENTIAL - Abnormal; Notable for the following:    Hemoglobin 15.7 (*)    All other components within normal limits  COMPREHENSIVE METABOLIC PANEL - Abnormal; Notable for the following:    Glucose, Bld 110 (*)    GFR calc non Af Amer 87 (*)    All other components within normal limits  TROPONIN I - Abnormal; Notable for the following:    Troponin I 0.38 (*)    All other components within normal limits  POCT I-STAT TROPONIN I - Abnormal; Notable for the following:    Troponin i, poc 0.11 (*)    All other components within normal limits  LIPASE, BLOOD  PROTIME-INR  TROPONIN I  SAMPLE TO BLOOD BANK   Dg Chest Port 1 View  08/14/2012  *RADIOLOGY REPORT*  Clinical Data: Chest pain, shortness of breath tonight  PORTABLE CHEST ONE-VIEW:  Comparison: Portable exam 0105 hours without  priors for comparison  Findings:   Normal heart size and pulmonary vascularity.  Tortuous aorta.  Lungs clear.  No pleural effusion or pneumothorax.  No acute osseous findings.  IMPRESSION:  No acute abnormalities. *RADIOLOGY REPORT*  Clinical Data: Chest pain, shortness of breath tonight  PORTABLE CHEST ONE-VIEW:  Comparison: Portable exam 0105 hours without priors for comparison  Findings: Normal heart size and pulmonary vascularity. Tortuous aorta. Lungs clear. No pleural effusion or pneumothorax. No acute osseous findings.  IMPRESSION: No acute abnormalities.   Original Report Authenticated By: Ulyses Southward, M.D.     Date: 08/14/2012  Rate: 67  Rhythm: normal sinus rhythm  QRS Axis: normal  Intervals: normal  ST/T Wave abnormalities: nonspecific T wave changes  Conduction Disutrbances:none  Narrative Interpretation: new t wave inversion laterally from 08-10-12  Old EKG Reviewed: changes noted    1. Chest pain   2. NSTEMI (non-ST elevated myocardial infarction)       MDM  44 -year-old female with chest pain, history of coronary disease, status post stent.  She is new T. wave inversions laterally.  She has a positive troponin.  Chest pain is atypical in that it had significant radiation into her back between her shoulder blades, and is much more severe than she normally does experience.  I have concern for dissection, given her symptoms.  We'll get CT angioma, chest, abdomen pelvis.  Dissection protocol.  Will treat with nitroglycerin and morphine.  Patient is very taken aspirin full dose tonight.  Patient updated on findings and current plan.  Discuss case with [facility] cardiologist, Dr. Tresa Endo.  Updated on positive troponin.  Awaiting dissection protocol CT scan, if negative will start heparin.       Olivia Mackie, MD 08/14/12 [phone]

## 2012-08-14 NOTE — H&P (Signed)
Shannon Cummings is an 67 y.o. female.   Chief Complaint: Chest Pain HPI: 67 yo woman with PMH of hypertension, dyslipidemia, paroxysmal atrial fibrillation, CAD with prior stent, OSA on CPAP who came to the ER with left-sided chest pain with radiation to her jaw and to her back between her shoulders along with her left arm. Pain began at 20:00 this evening and progressed with walking/exertion leading to presentation. The pain is a heavy sensation with associated jaw, arm, shoulder pain. She actually came to the ER on Saturday with negative workout but was told to return if he had continued symptoms, which she has and now returns. She felt well on Monday but Tuesday evening she had 45 minutes duration of pain before it started to ease up leading to presentation. In the ER, she had a CT dissection protocol given some radiation to her back/shoulder blades which was negative for dissection, she was started on heparin and aspirin.      Past Medical History  Diagnosis Date  . OSA on CPAP   . Palpitation   . Hyperlipidemia   . Hypertension   . Atrial fibrillation   . Tubular adenoma of colon 2007  . CAD (coronary artery disease)   . Osteoporosis   . Fatty infiltration of liver   . GERD (gastroesophageal reflux disease)     Past Surgical History  Procedure Laterality Date  . Total abdominal hysterectomy    . Tonsillectomy    . Coronary stent placement  2000    Family History  Problem Relation Age of Onset  . Breast cancer Maternal Aunt   . Heart disease Father   . Alzheimer's disease Mother   . Colon cancer Neg Hx    Social History:  reports that she has never smoked. She has never used smokeless tobacco. She reports that  drinks alcohol. She reports that she does not use illicit drugs.  Allergies:  Allergies  Allergen Reactions  . Statins     Too many side effects to want to take medication     (Not in a hospital admission)  Results for orders placed during the hospital  encounter of 08/14/12 (from the past 48 hour(s))  CBC WITH DIFFERENTIAL     Status: Abnormal   Collection Time    08/13/12 11:57 PM      Result Value Range   WBC 6.2  4.0 - 10.5 K/uL   RBC 5.08  3.87 - 5.11 MIL/uL   Hemoglobin 15.7 (*) 12.0 - 15.0 g/dL   HCT 54.0  98.1 - 19.1 %   MCV 86.8  78.0 - 100.0 fL   MCH 30.9  26.0 - 34.0 pg   MCHC 35.6  30.0 - 36.0 g/dL   RDW 47.8  29.5 - 62.1 %   Platelets 190  150 - 400 K/uL   Neutrophils Relative 60  43 - 77 %   Neutro Abs 3.7  1.7 - 7.7 K/uL   Lymphocytes Relative 31  12 - 46 %   Lymphs Abs 1.9  0.7 - 4.0 K/uL   Monocytes Relative 7  3 - 12 %   Monocytes Absolute 0.4  0.1 - 1.0 K/uL   Eosinophils Relative 2  0 - 5 %   Eosinophils Absolute 0.1  0.0 - 0.7 K/uL   Basophils Relative 0  0 - 1 %   Basophils Absolute 0.0  0.0 - 0.1 K/uL  COMPREHENSIVE METABOLIC PANEL     Status: Abnormal   Collection Time  08/13/12 11:57 PM      Result Value Range   Sodium 138  135 - 145 mEq/L   Potassium 3.6  3.5 - 5.1 mEq/L   Chloride 100  96 - 112 mEq/L   CO2 24  19 - 32 mEq/L   Glucose, Bld 110 (*) 70 - 99 mg/dL   BUN 16  6 - 23 mg/dL   Creatinine, Ser 1.61  0.50 - 1.10 mg/dL   Calcium 9.5  8.4 - 09.6 mg/dL   Total Protein 7.6  6.0 - 8.3 g/dL   Albumin 4.4  3.5 - 5.2 g/dL   AST 25  0 - 37 U/L   ALT 25  0 - 35 U/L   Alkaline Phosphatase 70  39 - 117 U/L   Total Bilirubin 0.6  0.3 - 1.2 mg/dL   GFR calc non Af Amer 87 (*) >90 mL/min   GFR calc Af Amer >90  >90 mL/min   Comment:            The eGFR has been calculated     using the CKD EPI equation.     This calculation has not been     validated in all clinical     situations.     eGFR's persistently     <90 mL/min signify     possible Chronic Kidney Disease.  LIPASE, BLOOD     Status: None   Collection Time    08/13/12 11:57 PM      Result Value Range   Lipase 38  11 - 59 U/L  TROPONIN I     Status: Abnormal   Collection Time    08/13/12 11:57 PM      Result Value Range    Troponin I 0.38 (*) <0.30 ng/mL   Comment: CRITICAL RESULT CALLED TO, READ BACK BY AND VERIFIED WITH:     J.OBERPHAOER,RN 0208 08/14/12 M.CAMPBELL                Due to the release kinetics of cTnI,     a negative result within the first hours     of the onset of symptoms does not rule out     myocardial infarction with certainty.     If myocardial infarction is still suspected,     repeat the test at appropriate intervals.  POCT I-STAT TROPONIN I     Status: Abnormal   Collection Time    08/14/12 12:16 AM      Result Value Range   Troponin i, poc 0.11 (*) 0.00 - 0.08 ng/mL   Comment NOTIFIED PHYSICIAN     Comment 3            Comment: Due to the release kinetics of cTnI,     a negative result within the first hours     of the onset of symptoms does not rule out     myocardial infarction with certainty.     If myocardial infarction is still suspected,     repeat the test at appropriate intervals.  PROTIME-INR     Status: None   Collection Time    08/14/12  1:25 AM      Result Value Range   Prothrombin Time 13.5  11.6 - 15.2 seconds   INR 1.04  0.00 - 1.49  SAMPLE TO BLOOD BANK     Status: None   Collection Time    08/14/12  1:25 AM      Result Value Range  Blood Bank Specimen SAMPLE AVAILABLE FOR TESTING     Sample Expiration 08/15/2012     Dg Chest Port 1 View  08/14/2012  *RADIOLOGY REPORT*  Clinical Data: Chest pain, shortness of breath tonight  PORTABLE CHEST ONE-VIEW:  Comparison: Portable exam 0105 hours without priors for comparison  Findings:   Normal heart size and pulmonary vascularity.  Tortuous aorta.  Lungs clear.  No pleural effusion or pneumothorax.  No acute osseous findings.  IMPRESSION:  No acute abnormalities. *RADIOLOGY REPORT*  Clinical Data: Chest pain, shortness of breath tonight  PORTABLE CHEST ONE-VIEW:  Comparison: Portable exam 0105 hours without priors for comparison  Findings: Normal heart size and pulmonary vascularity. Tortuous aorta. Lungs clear.  No pleural effusion or pneumothorax. No acute osseous findings.  IMPRESSION: No acute abnormalities.   Original Report Authenticated By: Ulyses Southward, M.D.    Ct Angio Chest Aortic Dissect W &/or W/o  08/14/2012  *RADIOLOGY REPORT*  Clinical Data:  Chest pain  CT ANGIOGRAPHY CHEST, ABDOMEN AND PELVIS  Technique:  Multidetector CT imaging through the chest, abdomen and pelvis was performed using the standard protocol during bolus administration of intravenous contrast.  Multiplanar reconstructed images including MIPs were obtained and reviewed to evaluate the vascular anatomy.  Contrast: OMNIPAQUE IOHEXOL 350 MG/ML SOLN  Comparison:  11/11/2008 abdominal MRI  CTA CHEST  Findings:  Normal caliber aorta and branch vessels.  No aneurysmal dilatation or dissection.  Main pulmonary arterial branches are patent.  Heart size upper normal to mildly enlarged.  No intrathoracic lymphadenopathy.  No pleural or pericardial effusion.  Central airways are patent.  Mild bibasilar atelectasis or scarring.  Biapical scarring. 8 mm subpleural nodular density right upper lobe posteriorly on series 6 image 19.  No confluent airspace opacity.  No pneumothorax.   Review of the MIP images confirms the above findings.  IMPRESSION: No aortic dissection or acute intrathoracic process.  Heart size upper normal to mildly enlarged.  8 mm subpleural nodular density within the right upper lobe. If the patient is at high risk for bronchogenic carcinoma, follow-up chest CT at 3-6 months is recommended.  If the patient is at low risk for bronchogenic carcinoma, follow-up chest CT at 6-12 months is recommended.  This recommendation follows the consensus statement: Guidelines for Management of Small Pulmonary Nodules Detected on CT Scans: A Statement from the Fleischner Society as published in Radiology 2005; 237:395-400.  CTA ABDOMEN AND PELVIS  Findings:  Intra-abdominal organ evaluation is limited in the arterial phase.  Question hepatic  steatosis.  Unremarkable biliary system, spleen, pancreas, adrenal glands.  There are a couple too small to further characterize renal hypodensities.  No hydronephrosis or hydroureter.  No CT evidence for colitis.  Colonic diverticulosis.  Normal appendix.  Small bowel loops are normal course and caliber.  Thin-walled bladder.  Absent uterus.  No adnexal mass.  Normal caliber abdominal aorta.  Patent celiac axis, SMA, IMA, and iliac vessels.  Mild scattered atherosclerotic disease.  No acute osseous finding. Multi lumbar degenerative disc disease.  Review of the MIP images confirms the above findings.  IMPRESSION: Mild scattered atherosclerotic disease of the abdominal aorta and branch vessels.  No aneurysmal dilatation or dissection.  No acute abdominopelvic process identified by CT.   Original Report Authenticated By: Jearld Lesch, M.D.    Ct Angio Abd/pel W/ And/or W/o  08/14/2012  *RADIOLOGY REPORT*  Clinical Data:  Chest pain  CT ANGIOGRAPHY CHEST, ABDOMEN AND PELVIS  Technique:  Multidetector CT imaging through  the chest, abdomen and pelvis was performed using the standard protocol during bolus administration of intravenous contrast.  Multiplanar reconstructed images including MIPs were obtained and reviewed to evaluate the vascular anatomy.  Contrast: OMNIPAQUE IOHEXOL 350 MG/ML SOLN  Comparison:  11/11/2008 abdominal MRI  CTA CHEST  Findings:  Normal caliber aorta and branch vessels.  No aneurysmal dilatation or dissection.  Main pulmonary arterial branches are patent.  Heart size upper normal to mildly enlarged.  No intrathoracic lymphadenopathy.  No pleural or pericardial effusion.  Central airways are patent.  Mild bibasilar atelectasis or scarring.  Biapical scarring. 8 mm subpleural nodular density right upper lobe posteriorly on series 6 image 19.  No confluent airspace opacity.  No pneumothorax.   Review of the MIP images confirms the above findings.  IMPRESSION: No aortic dissection or  acute intrathoracic process.  Heart size upper normal to mildly enlarged.  8 mm subpleural nodular density within the right upper lobe. If the patient is at high risk for bronchogenic carcinoma, follow-up chest CT at 3-6 months is recommended.  If the patient is at low risk for bronchogenic carcinoma, follow-up chest CT at 6-12 months is recommended.  This recommendation follows the consensus statement: Guidelines for Management of Small Pulmonary Nodules Detected on CT Scans: A Statement from the Fleischner Society as published in Radiology 2005; 237:395-400.  CTA ABDOMEN AND PELVIS  Findings:  Intra-abdominal organ evaluation is limited in the arterial phase.  Question hepatic steatosis.  Unremarkable biliary system, spleen, pancreas, adrenal glands.  There are a couple too small to further characterize renal hypodensities.  No hydronephrosis or hydroureter.  No CT evidence for colitis.  Colonic diverticulosis.  Normal appendix.  Small bowel loops are normal course and caliber.  Thin-walled bladder.  Absent uterus.  No adnexal mass.  Normal caliber abdominal aorta.  Patent celiac axis, SMA, IMA, and iliac vessels.  Mild scattered atherosclerotic disease.  No acute osseous finding. Multi lumbar degenerative disc disease.  Review of the MIP images confirms the above findings.  IMPRESSION: Mild scattered atherosclerotic disease of the abdominal aorta and branch vessels.  No aneurysmal dilatation or dissection.  No acute abdominopelvic process identified by CT.   Original Report Authenticated By: Jearld Lesch, M.D.     Review of Systems  Constitutional: Negative for fever, chills and weight loss.  HENT: Negative for hearing loss and tinnitus.   Eyes: Negative for double vision, photophobia and pain.  Respiratory: Negative for cough, hemoptysis and sputum production.   Cardiovascular: Positive for chest pain. Negative for palpitations, orthopnea and claudication.  Gastrointestinal: Negative for heartburn,  nausea, vomiting and abdominal pain.  Genitourinary: Negative for dysuria, urgency and frequency.  Musculoskeletal: Positive for back pain. Negative for myalgias, joint pain and falls.  Skin: Positive for rash. Negative for itching.  Neurological: Negative for dizziness, tingling, tremors and headaches.  Endo/Heme/Allergies: Negative for environmental allergies. Does not bruise/bleed easily.  Psychiatric/Behavioral: Negative for depression, suicidal ideas and substance abuse.    Blood pressure 131/94, pulse 71, temperature 97.5 F (36.4 C), temperature source Oral, resp. rate 16, height 5' 1.02" (1.55 m), weight 62.596 kg (138 lb), SpO2 99.00%. Physical Exam  Nursing note and vitals reviewed. Constitutional: She is oriented to person, place, and time. She appears well-developed and well-nourished. No distress.  HENT:  Head: Normocephalic and atraumatic.  Nose: Nose normal.  Mouth/Throat: Oropharynx is clear and moist. No oropharyngeal exudate.  Eyes: Conjunctivae and EOM are normal. Pupils are equal, round, and reactive to  light. No scleral icterus.  Neck: Normal range of motion. Neck supple. No JVD present. No tracheal deviation present. No thyromegaly present.  Cardiovascular: Normal rate, regular rhythm, normal heart sounds and intact distal pulses.  Exam reveals no gallop and no friction rub.   No murmur heard. Respiratory: Effort normal and breath sounds normal. No respiratory distress. She has no wheezes.  GI: Soft. Bowel sounds are normal. She exhibits no distension. There is no tenderness.  Musculoskeletal: Normal range of motion. She exhibits no edema and no tenderness.  Neurological: She is alert and oriented to person, place, and time. No cranial nerve deficit. Coordination normal.  Skin: Skin is warm and dry. No rash noted. She is not diaphoretic. No erythema.  Psychiatric: She has a normal mood and affect. Her behavior is normal.    Labs reviewed; wbc 6.2, h/h 15.7/44.1, plt  190, na 138, K 3.6, bun/cr 16/0.73, troponin 0.38 ECG reviewed; sinus, 1st degree AVB, prolonged QT Echo reviewed from 2007; EF 55-60%  Problem List Chest Pain NSTEMI Known CAD  Assessment/Plan 67 yo woman with chest pain, known prior CAD here with Nstemi. Differential diagnosis is musculoskeletal, supply/demand and NSTEMI. I favor actual NSTEMI given the history, known prior CAD and quality of the pain. She's on aspirin/heparin with improved to nearly resolved pain (along with nitro gtt). Will plan for hopeful LHC later today.  - NPO after MN for LHC - aspirin 81 mg, heparin gtt - nitro gtt - update lipid panel, hba1c, tsh, BNP - Echo in AM  - PPI as able - low dose beta blocker - holding statin due to intolerance KELLY, JACOB 08/14/2012, 4:14 AM

## 2012-08-14 NOTE — Progress Notes (Signed)
TR BAND REMOVAL  LOCATION:    right radial  DEFLATED PER PROTOCOL:    yes  TIME BAND OFF / DRESSING APPLIED:    1730   SITE UPON ARRIVAL:    Level 1  SITE AFTER BAND REMOVAL:    Level 1  REVERSE ALLEN'S TEST:     positive  CIRCULATION SENSATION AND MOVEMENT:    Within Normal Limits   yes  COMMENTS:   Tolerated procedure well . Site level 1 bruised ,no hematoma

## 2012-08-14 NOTE — Progress Notes (Signed)
PROGRESS NOTE  Subjective:   Shannon Cummings is a 67 yo with hx of CAD - s/p stenting of her LAD, hyperlipidemia, atrial fibrillation admitted with progressive CP for the past several days.  She presented with exertional CP - similar to her previous episodes of CP prior to stenting.  Associated with climbing stairs.  She did not have any NTG.  Came to ER on Saturday - eval was negative.  Called the office on Monday and was offered an apt. In April.  Came back to the ER last night.  Troponin is mildly elevated.  CP has resolved on NTG and heparin.   Objective:    Vital Signs:   Temp:  [97.5 F (36.4 C)] 97.5 F (36.4 C) (03/11 2356) Pulse Rate:  [58-71] 59 (03/12 0430) Resp:  [10-16] 12 (03/12 0430) BP: (106-131)/(67-94) 118/82 mmHg (03/12 0430) SpO2:  [95 %-100 %] 100 % (03/12 0430) Weight:  [138 lb (62.596 kg)] 138 lb (62.596 kg) (03/12 0300)      24-hour weight change: Weight change:   Weight trends: Filed Weights   08/14/12 0300  Weight: 138 lb (62.596 kg)    Intake/Output:        Physical Exam: BP 118/82  Pulse 59  Temp(Src) 97.5 F (36.4 C) (Oral)  Resp 12  Ht 5' 1.02" (1.55 m)  Wt 138 lb (62.596 kg)  BMI 26.05 kg/m2  SpO2 100%  General: Vital signs reviewed and noted.   Head: Normocephalic, atraumatic.  Eyes: conjunctivae/corneas clear.  EOM's intact.   Throat: normal  Neck:  normal  Lungs:    clear  Heart:  RR, normal S1, S2  Abdomen:  Soft, non-tender, non-distended    Extremities: Good pulses   Neurologic: A&O X3, CN II - XII are grossly intact.   Psych: Normal     Labs: BMET:  Recent Labs  08/13/12 2357 08/14/12 0551  NA 138 141  K 3.6 3.9  CL 100 106  CO2 24 24  GLUCOSE 110* 112*  BUN 16 12  CREATININE 0.73 0.69  CALCIUM 9.5 8.0*    Liver function tests:  Recent Labs  08/13/12 2357 08/14/12 0551  AST 25 23  ALT 25 20  ALKPHOS 70 61  BILITOT 0.6 0.7  PROT 7.6 6.3  ALBUMIN 4.4 3.7    Recent Labs  08/13/12 2357  LIPASE  38    CBC:  Recent Labs  08/13/12 2357  WBC 6.2  NEUTROABS 3.7  HGB 15.7*  HCT 44.1  MCV 86.8  PLT 190    Cardiac Enzymes:  Recent Labs  08/13/12 2357 08/14/12 0551  TROPONINI 0.38* 0.33*    Coagulation Studies:  Recent Labs  08/14/12 0125  LABPROT 13.5  INR 1.04    Other: No components found with this basename: POCBNP,  No results found for this basename: DDIMER,  in the last 72 hours No results found for this basename: HGBA1C,  in the last 72 hours No results found for this basename: CHOL, HDL, LDLCALC, TRIG, CHOLHDL,  in the last 72 hours No results found for this basename: TSH, T4TOTAL, FREET3, T3FREE, THYROIDAB,  in the last 72 hours No results found for this basename: VITAMINB12, FOLATE, FERRITIN, TIBC, IRON, RETICCTPCT,  in the last 72 hours   Other results:  EKG - NSR , TWI anteriorly - new since last year.     Medications:    Infusions: . heparin 750 Units/hr (08/14/12 0414)  . nitroGLYCERIN 15 mcg/min (08/14/12 0527)    Scheduled  Medications: . amLODipine  5 mg Oral QHS  . aspirin  81 mg Oral Daily  . [START ON 08/15/2012] aspirin EC  81 mg Oral Daily  . atenolol  25 mg Oral QHS  . pantoprazole  40 mg Oral Daily  . propafenone  150 mg Oral BID    Assessment/ Plan:      NSTEMI (non-ST elevated myocardial infarction) - patient presents with progressive CP for the past several days.  She has new ECG changes ( worsened since 3 days ago) Troponin levels are mildly elevated.  Will proceed with cath today.  Cancel echo - I do not think we need it since we are doing a cath.    Have discussed cath.  She understands and agrees to proceed.     HYPERLIPIDEMIA - intolerant to statins.  Will try Zetia 10 QD ( assuming that she tolerates it)    HYPERTENSION    Atrial fibrillation- stable , in NSR currently    Disposition: cath today, keep NPO Length of Stay: 0  Vesta Mixer, Montez Hageman., MD, [facility] 08/14/2012, 7:49 AM Office [phone] Pager  [phone]

## 2012-08-14 NOTE — Care Management Note (Signed)
    Page 1 of 1   08/14/2012     10:12:53 AM   CARE MANAGEMENT NOTE 08/14/2012  Patient:  BREALYNN, CONTINO   Account Number:  [id number]  Date Initiated:  08/14/2012  Documentation initiated by:  Junius Creamer  Subjective/Objective Assessment:   adm w mi     Action/Plan:   lives w husband, pcp dr Jonita Albee spear   Anticipated DC Date:     Anticipated DC Plan:        DC Planning Services  CM consult      Choice offered to / List presented to:             Status of service:   Medicare Important Message given?   (If response is "NO", the following Medicare IM given date fields will be blank) Date Medicare IM given:   Date Additional Medicare IM given:    Discharge Disposition:    Per UR Regulation:  Reviewed for med. necessity/level of care/duration of stay  If discussed at Long Length of Stay Meetings, dates discussed:    Comments:  3/12 1012 debbie Zyann Mabry rn,bsn

## 2012-08-14 NOTE — Interval H&P Note (Signed)
History and Physical Interval Note:  08/14/2012 10:55 AM  Shannon Cummings  has presented today for surgery, with the diagnosis of cp  The various methods of treatment have been discussed with the patient and family. After consideration of risks, benefits and other options for treatment, the patient has consented to  Procedure(s): LEFT HEART CATHETERIZATION WITH CORONARY ANGIOGRAM (N/A) as a surgical intervention .  The patient's history has been reviewed, patient examined, no change in status, stable for surgery.  I have reviewed the patient's chart and labs.  Questions were answered to the patient's satisfaction.     Tonny Bollman

## 2012-08-14 NOTE — Progress Notes (Signed)
Pt arrived to 2900 from Ed at 0950, pt settled in room with heparin and nitro running as documented. Denied CP. Order received to prepare for cath lab. Husband updated by pt. Pt transported at 1039 to cath lab by [facility]

## 2012-08-14 NOTE — ED Notes (Signed)
Pt. Given 4 mg morphine and 3 nitro SL tablets during downtime. Pt. Reports relief.

## 2012-08-14 NOTE — ED Notes (Signed)
Cardiologist at bedside.  

## 2012-08-14 NOTE — CV Procedure (Signed)
   Cardiac Catheterization Procedure Note  Name: Shannon Cummings MRN: [id number] DOB: April 20, 1946  Procedure: Left Heart Cath, Selective Coronary Angiography, LV angiography, PTCA and stenting of the proximal LAD  Indication: 67 year-old woman with coronary artery disease. She had remote LAD stenting approximately 10-12 years ago. She presented with non-ST elevation infarction and has had ongoing substernal chest pain. We are proceeding urgently with cardiac catheterization because of ongoing ischemic symptoms  Procedural Details:  The right wrist was prepped, draped, and anesthetized with 1% lidocaine. Using the modified Seldinger technique, a 5 French sheath was introduced into the right radial artery. 3 mg of verapamil was administered through the sheath, weight-based unfractionated heparin was administered intravenously. Standard Judkins catheters were used for selective coronary angiography and left ventriculography. Catheter exchanges were performed over an exchange length guidewire.  PROCEDURAL FINDINGS Hemodynamics: AO 103/57 with a mean of 76 LV 103/10   Coronary angiography: Coronary dominance: right  Left mainstem: The left main is short. The vessel is widely patent.  Left anterior descending (LAD): The LAD is previously stented in the proximal aspect. The vessel has critical 99% stenosis with TIMI 2 flow. The in-stent restenosis is focal. The diagonal branches are patent. The first diagonal has mild 40% stenosis. The remaining portions of the mid and distal LAD are patent to the LV apex.  Left circumflex (LCx): The left circumflex is large in caliber. The vessel is widely patent throughout. There are no significant stenoses. There are 3 large obtuse marginal branches without significant stenosis.  Right coronary artery (RCA): The RCA is a large, dominant vessel. There is no significant stenosis present. The PDA branch is patent.  Left ventriculography: There was hypokinesis of  the mid anterior wall and apex. The estimated left ventricular ejection fraction is 45%.  PCI Note:  Following the diagnostic procedure, the decision was made to proceed with PCI. There is critical stenosis of the proximal LAD within the previously implanted stent. The other coronary vessels are widely patent. Weight-based bivalirudin was given for anticoagulation. She was loaded with brilinta 180 mg. Once a therapeutic ACT was achieved, a 5 [place] EBU guide catheter was inserted.  A run through coronary guidewire was used to cross the lesion.  The lesion was predilated with a 2.5 x 15 mm balloon.  The lesion was then stented with a 3.0 x 20 mm Promus premier stent.  The stent was postdilated with a 3.25 mm noncompliant balloon.  Following PCI, there was 0% residual stenosis and TIMI-3 flow. Final angiography confirmed an excellent result. The patient tolerated the procedure well. There were no immediate procedural complications. A TR band was used for radial hemostasis. The patient was transferred to the post catheterization recovery area for further monitoring.  PCI Data: Vessel - LAD/Segment - proximal Percent Stenosis (pre)  99 TIMI-flow 2 Stent 3.0 x 20 mm drug-eluting Percent Stenosis (post) 0 TIMI-flow (post) 3  Final Conclusions:   1. Severe proximal LAD stenosis with successful PCI as above 2. Widely patent left main, left circumflex, and right coronary arteries 3. Mild to moderate segmental left ventricular systolic dysfunction  Recommendations:  Dual antiplatelet therapy with aspirin and brilinta for at least 12 months. Continue medical therapy as tolerated.  Tonny Bollman 08/14/2012, 11:56 AM

## 2012-08-14 NOTE — ED Notes (Signed)
Pt resting laying on stretcher, no needs at this time

## 2012-08-15 ENCOUNTER — Encounter (HOSPITAL_COMMUNITY): Payer: Self-pay | Admitting: Physician Assistant

## 2012-08-15 DIAGNOSIS — R079 Chest pain, unspecified: Secondary | ICD-10-CM

## 2012-08-15 DIAGNOSIS — I4891 Unspecified atrial fibrillation: Secondary | ICD-10-CM

## 2012-08-15 LAB — CBC
MCH: 31.6 pg (ref 26.0–34.0)
MCHC: 34.7 g/dL (ref 30.0–36.0)
Platelets: 193 10*3/uL (ref 150–400)

## 2012-08-15 LAB — BASIC METABOLIC PANEL
Calcium: 9.1 mg/dL (ref 8.4–10.5)
GFR calc non Af Amer: 63 mL/min — ABNORMAL LOW (ref >90)
Sodium: 142 mEq/L (ref 135–145)

## 2012-08-15 MED ORDER — TICAGRELOR 90 MG PO TABS: 90.0000 mg | ORAL_TABLET | Freq: Two times a day (BID) | ORAL | Status: DC

## 2012-08-15 MED ORDER — ASPIRIN 81 MG PO TABS: 81.0000 mg | ORAL_TABLET | Freq: Every day | ORAL | Status: DC

## 2012-08-15 MED ORDER — NITROGLYCERIN 0.4 MG SL SUBL: 0.4000 mg | SUBLINGUAL_TABLET | SUBLINGUAL | Status: DC | PRN

## 2012-08-15 MED FILL — Dextrose Inj 5%: INTRAVENOUS | Qty: 50 | Status: AC

## 2012-08-15 NOTE — Progress Notes (Addendum)
Shannon Cummings is an 67 y.o. female.  Chief Complaint: Chest Pain  HPI: 67 yo woman with PMH of hypertension, dyslipidemia, paroxysmal atrial fibrillation, CAD with prior stent, OSA on CPAP who came to the ER with left-sided chest pain with radiation to her jaw and to her back between her shoulders along with her left arm. Pain began at 20:00 this evening and progressed with walking/exertion leading to presentation. The pain is a heavy sensation with associated jaw, arm, shoulder pain. She actually came to the ER on Saturday with negative workout but was told to return if he had continued symptoms, which she has and now returns. She felt well on Monday but Tuesday evening she had 45 minutes duration of pain before it started to ease up leading to presentation. In the ER, she had a CT dissection protocol given some radiation to her back/shoulder blades which was negative for dissection, she was started on heparin and aspirin.   She had stent placed to her LAD yesterday.    Doing great, no angina.  Past Medical History   Diagnosis  Date   .  OSA on CPAP    .  Palpitation    .  Hyperlipidemia    .  Hypertension    .  Atrial fibrillation    .  Tubular adenoma of colon  2007   .  CAD (coronary artery disease)    .  Osteoporosis    .  Fatty infiltration of liver    .  GERD (gastroesophageal reflux disease)     Past Surgical History   Procedure  Laterality  Date   .  Total abdominal hysterectomy     .  Tonsillectomy     .  Coronary stent placement   2000    Family History   Problem  Relation  Age of Onset   .  Breast cancer  Maternal Aunt    .  Heart disease  Father    .  Alzheimer's disease  Mother    .  Colon cancer  Neg Hx     Social History: reports that she has never smoked. She has never used smokeless tobacco. She reports that drinks alcohol. She reports that she does not use illicit drugs.  Allergies:  Allergies   Allergen  Reactions   .  Statins      Too many side effects to  want to take medication     (Not in a hospital admission)  Results for orders placed during the hospital encounter of 08/14/12 (from the past 48 hour(s))   CBC WITH DIFFERENTIAL Status: Abnormal    Collection Time    08/13/12 11:57 PM   Result  Value  Range    WBC  6.2  4.0 - 10.5 K/uL    RBC  5.08  3.87 - 5.11 MIL/uL    Hemoglobin  15.7 (*)  12.0 - 15.0 g/dL    HCT  29.5  62.1 - 30.8 %    MCV  86.8  78.0 - 100.0 fL    MCH  30.9  26.0 - 34.0 pg    MCHC  35.6  30.0 - 36.0 g/dL    RDW  65.7  84.6 - 96.2 %    Platelets  190  150 - 400 K/uL    Neutrophils Relative  60  43 - 77 %    Neutro Abs  3.7  1.7 - 7.7 K/uL    Lymphocytes Relative  31  12 - 46 %  Lymphs Abs  1.9  0.7 - 4.0 K/uL    Monocytes Relative  7  3 - 12 %    Monocytes Absolute  0.4  0.1 - 1.0 K/uL    Eosinophils Relative  2  0 - 5 %    Eosinophils Absolute  0.1  0.0 - 0.7 K/uL    Basophils Relative  0  0 - 1 %    Basophils Absolute  0.0  0.0 - 0.1 K/uL   COMPREHENSIVE METABOLIC PANEL Status: Abnormal    Collection Time    08/13/12 11:57 PM   Result  Value  Range    Sodium  138  135 - 145 mEq/L    Potassium  3.6  3.5 - 5.1 mEq/L    Chloride  100  96 - 112 mEq/L    CO2  24  19 - 32 mEq/L    Glucose, Bld  110 (*)  70 - 99 mg/dL    BUN  16  6 - 23 mg/dL    Creatinine, Ser  1.61  0.50 - 1.10 mg/dL    Calcium  9.5  8.4 - 10.5 mg/dL    Total Protein  7.6  6.0 - 8.3 g/dL    Albumin  4.4  3.5 - 5.2 g/dL    AST  25  0 - 37 U/L    ALT  25  0 - 35 U/L    Alkaline Phosphatase  70  39 - 117 U/L    Total Bilirubin  0.6  0.3 - 1.2 mg/dL    GFR calc non Af Amer  87 (*)  >90 mL/min    GFR calc Af Amer  >90  >90 mL/min    Comment:      The eGFR has been calculated     using the CKD EPI equation.     This calculation has not been     validated in all clinical     situations.     eGFR's persistently     <90 mL/min signify     possible Chronic Kidney Disease.   LIPASE, BLOOD Status: None    Collection Time    08/13/12  11:57 PM   Result  Value  Range    Lipase  38  11 - 59 U/L   TROPONIN I Status: Abnormal    Collection Time    08/13/12 11:57 PM   Result  Value  Range    Troponin I  0.38 (*)  <0.30 ng/mL    Comment:  CRITICAL RESULT CALLED TO, READ BACK BY AND VERIFIED WITH:     J.OBERPHAOER,RN 0208 08/14/12 M.CAMPBELL         Due to the release kinetics of cTnI,     a negative result within the first hours     of the onset of symptoms does not rule out     myocardial infarction with certainty.     If myocardial infarction is still suspected,     repeat the test at appropriate intervals.   POCT I-STAT TROPONIN I Status: Abnormal    Collection Time    08/14/12 12:16 AM   Result  Value  Range    Troponin i, poc  0.11 (*)  0.00 - 0.08 ng/mL    Comment  NOTIFIED PHYSICIAN     Comment 3      Comment:  Due to the release kinetics of cTnI,     a negative result within the first hours  of the onset of symptoms does not rule out     myocardial infarction with certainty.     If myocardial infarction is still suspected,     repeat the test at appropriate intervals.   PROTIME-INR Status: None    Collection Time    08/14/12 1:25 AM   Result  Value  Range    Prothrombin Time  13.5  11.6 - 15.2 seconds    INR  1.04  0.00 - 1.49   SAMPLE TO BLOOD BANK Status: None    Collection Time    08/14/12 1:25 AM   Result  Value  Range    Blood Bank Specimen  SAMPLE AVAILABLE FOR TESTING     Sample Expiration  08/15/2012     Dg Chest Port 1 View  08/14/2012 *RADIOLOGY REPORT* Clinical Data: Chest pain, shortness of breath tonight PORTABLE CHEST ONE-VIEW: Comparison: Portable exam 0105 hours without priors for comparison Findings: Normal heart size and pulmonary vascularity. Tortuous aorta. Lungs clear. No pleural effusion or pneumothorax. No acute osseous findings. IMPRESSION: No acute abnormalities. *RADIOLOGY REPORT* Clinical Data: Chest pain, shortness of breath tonight PORTABLE CHEST ONE-VIEW: Comparison:  Portable exam 0105 hours without priors for comparison Findings: Normal heart size and pulmonary vascularity. Tortuous aorta. Lungs clear. No pleural effusion or pneumothorax. No acute osseous findings. IMPRESSION: No acute abnormalities. Original Report Authenticated By: Ulyses Southward, M.D.  Ct Angio Chest Aortic Dissect W &/or W/o  08/14/2012 *RADIOLOGY REPORT* Clinical Data: Chest pain CT ANGIOGRAPHY CHEST, ABDOMEN AND PELVIS Technique: Multidetector CT imaging through the chest, abdomen and pelvis was performed using the standard protocol during bolus administration of intravenous contrast. Multiplanar reconstructed images including MIPs were obtained and reviewed to evaluate the vascular anatomy. Contrast: OMNIPAQUE IOHEXOL 350 MG/ML SOLN Comparison: 11/11/2008 abdominal MRI CTA CHEST Findings: Normal caliber aorta and branch vessels. No aneurysmal dilatation or dissection. Main pulmonary arterial branches are patent. Heart size upper normal to mildly enlarged. No intrathoracic lymphadenopathy. No pleural or pericardial effusion. Central airways are patent. Mild bibasilar atelectasis or scarring. Biapical scarring. 8 mm subpleural nodular density right upper lobe posteriorly on series 6 image 19. No confluent airspace opacity. No pneumothorax. Review of the MIP images confirms the above findings. IMPRESSION: No aortic dissection or acute intrathoracic process. Heart size upper normal to mildly enlarged. 8 mm subpleural nodular density within the right upper lobe. If the patient is at high risk for bronchogenic carcinoma, follow-up chest CT at 3-6 months is recommended. If the patient is at low risk for bronchogenic carcinoma, follow-up chest CT at 6-12 months is recommended. This recommendation follows the consensus statement: Guidelines for Management of Small Pulmonary Nodules Detected on CT Scans: A Statement from the Fleischner Society as published in Radiology 2005; 237:395-400. CTA ABDOMEN AND PELVIS  Findings: Intra-abdominal organ evaluation is limited in the arterial phase. Question hepatic steatosis. Unremarkable biliary system, spleen, pancreas, adrenal glands. There are a couple too small to further characterize renal hypodensities. No hydronephrosis or hydroureter. No CT evidence for colitis. Colonic diverticulosis. Normal appendix. Small bowel loops are normal course and caliber. Thin-walled bladder. Absent uterus. No adnexal mass. Normal caliber abdominal aorta. Patent celiac axis, SMA, IMA, and iliac vessels. Mild scattered atherosclerotic disease. No acute osseous finding. Multi lumbar degenerative disc disease. Review of the MIP images confirms the above findings. IMPRESSION: Mild scattered atherosclerotic disease of the abdominal aorta and branch vessels. No aneurysmal dilatation or dissection. No acute abdominopelvic process identified by CT. Original Report Authenticated By: Greig Castilla  DelGaizo, M.D.  Ct Angio Abd/pel W/ And/or W/o  08/14/2012 *RADIOLOGY REPORT* Clinical Data: Chest pain CT ANGIOGRAPHY CHEST, ABDOMEN AND PELVIS Technique: Multidetector CT imaging through the chest, abdomen and pelvis was performed using the standard protocol during bolus administration of intravenous contrast. Multiplanar reconstructed images including MIPs were obtained and reviewed to evaluate the vascular anatomy. Contrast: OMNIPAQUE IOHEXOL 350 MG/ML SOLN Comparison: 11/11/2008 abdominal MRI CTA CHEST Findings: Normal caliber aorta and branch vessels. No aneurysmal dilatation or dissection. Main pulmonary arterial branches are patent. Heart size upper normal to mildly enlarged. No intrathoracic lymphadenopathy. No pleural or pericardial effusion. Central airways are patent. Mild bibasilar atelectasis or scarring. Biapical scarring. 8 mm subpleural nodular density right upper lobe posteriorly on series 6 image 19. No confluent airspace opacity. No pneumothorax. Review of the MIP images confirms the above  findings. IMPRESSION: No aortic dissection or acute intrathoracic process. Heart size upper normal to mildly enlarged. 8 mm subpleural nodular density within the right upper lobe. If the patient is at high risk for bronchogenic carcinoma, follow-up chest CT at 3-6 months is recommended. If the patient is at low risk for bronchogenic carcinoma, follow-up chest CT at 6-12 months is recommended. This recommendation follows the consensus statement: Guidelines for Management of Small Pulmonary Nodules Detected on CT Scans: A Statement from the Fleischner Society as published in Radiology 2005; 237:395-400. CTA ABDOMEN AND PELVIS Findings: Intra-abdominal organ evaluation is limited in the arterial phase. Question hepatic steatosis. Unremarkable biliary system, spleen, pancreas, adrenal glands. There are a couple too small to further characterize renal hypodensities. No hydronephrosis or hydroureter. No CT evidence for colitis. Colonic diverticulosis. Normal appendix. Small bowel loops are normal course and caliber. Thin-walled bladder. Absent uterus. No adnexal mass. Normal caliber abdominal aorta. Patent celiac axis, SMA, IMA, and iliac vessels. Mild scattered atherosclerotic disease. No acute osseous finding. Multi lumbar degenerative disc disease. Review of the MIP images confirms the above findings. IMPRESSION: Mild scattered atherosclerotic disease of the abdominal aorta and branch vessels. No aneurysmal dilatation or dissection. No acute abdominopelvic process identified by CT. Original Report Authenticated By: Jearld Lesch, M.D.   Review of Systems  Constitutional: Negative for fever, chills and weight loss.  HENT: Negative for hearing loss and tinnitus.  Eyes: Negative for double vision, photophobia and pain.  Respiratory: Negative for cough, hemoptysis and sputum production.  Cardiovascular: Positive for chest pain. Negative for palpitations, orthopnea and claudication.  Gastrointestinal: Negative for  heartburn, nausea, vomiting and abdominal pain.  Genitourinary: Negative for dysuria, urgency and frequency.  Musculoskeletal: Positive for back pain. Negative for myalgias, joint pain and falls.  Skin: Positive for rash. Negative for itching.  Neurological: Negative for dizziness, tingling, tremors and headaches.  Endo/Heme/Allergies: Negative for environmental allergies. Does not bruise/bleed easily.  Psychiatric/Behavioral: Negative for depression, suicidal ideas and substance abuse.   Blood pressure 131/94, pulse 71, temperature 97.5 F (36.4 C), temperature source Oral, resp. rate 16, height 5' 1.02" (1.55 m), weight 62.596 kg (138 lb), SpO2 99.00%.  Physical Exam  Nursing note and vitals reviewed.  Constitutional: She is oriented to person, place, and time. She appears well-developed and well-nourished. No distress.  HENT:  Head: Normocephalic and atraumatic.  Nose: Nose normal.  Mouth/Throat: Oropharynx is clear and moist. No oropharyngeal exudate.  Eyes: Conjunctivae and EOM are normal. Pupils are equal, round, and reactive to light. No scleral icterus.  Neck: Normal range of motion. Neck supple. No JVD present. No tracheal deviation present. No thyromegaly present.  Cardiovascular:  Normal rate, regular rhythm, normal heart sounds and intact distal pulses. Exam reveals no gallop and no friction rub.  No murmur heard.  Respiratory: Effort normal and breath sounds normal. No respiratory distress. She has no wheezes.  GI: Soft. Bowel sounds are normal. She exhibits no distension. There is no tenderness.  Musculoskeletal: Normal range of motion. She exhibits no edema and no tenderness.  Neurological: She is alert and oriented to person, place, and time. No cranial nerve deficit. Coordination normal.  Skin: Skin is warm and dry. No rash noted. She is not diaphoretic. No erythema.  Psychiatric: She has a normal mood and affect. Her behavior is normal.   Labs reviewed; wbc 6.2, h/h  15.7/44.1, plt 190, na 138, K 3.6, bun/cr 16/0.73, troponin 0.38  ECG reviewed; sinus, 1st degree AVB, prolonged QT  Echo reviewed from 2007; EF 55-60%  Problem List  Chest Pain  NSTEMI  Known CAD  Assessment/Plan   1. CAD : s/p DES - 3.0 x 20 mm Promus premier stent. The stent was postdilated with a 3.25 mm noncompliant balloon. Doing well Home on current meds.  2. Hyperlipidemia: intolerant to statins.  Insurance would not cover Zetia. Will encourage her to eat a very low fat diet and exercise daily.  3. Atrial fib:  Continue rhythmol.  To see Lawson Fiscal in 2 weeks, see me in 1-2 months.  Vesta Mixer, Montez Hageman., MD, [facility] 08/15/2012, 9:00 AM Office - [phone] Pager [phone]

## 2012-08-15 NOTE — Progress Notes (Signed)
08/14/12.Marland KitchenMarland KitchenOletta Cohn, RN, BSN, [place] [phone] Benefits check in for Brilinta.  Verified with pt pharmacy [facility]) to ensure they have medication in stock.

## 2012-08-15 NOTE — Discharge Summary (Signed)
Discharge Summary   Patient ID: Shannon Cummings MRN: [id number], DOB/AGE: 08-Apr-1946 67 y.o. Admit date: 08/14/2012 D/C date:     08/15/2012  Primary Cardiologist: Nahser  Primary Discharge Diagnoses:  1. CAD with NSTEMI this admission  - s/p DES to prox LAD with EF 45% by cath 08/14/12 - history of prior stenting 2. History of PAF, on Rythmol 3. HTN 4. Dyslipidemia - intolerant to statins 5. Elevated TSH - instructed to f/u PCP 6. 8 mm subpleural nodular density within the right upper lobe by CT 08/14/12 - instructed to f/u with her pulm MD Dr. Maple Hudson  Secondary Discharge Diagnoses:  1. OSA on CPAP 2. Fatty infiltration of liver 3. Osteoporosis 4. GERD 5. Tubular adenoma of colon  Hospital Course: Shannon Cummings is a 67 y/o F with history of hypertension, dyslipidemia, paroxysmal atrial fibrillation, CAD with prior stent, OSA on CPAP who came to the ER with left-sided chest pain with radiation to her jaw and to her back between her shoulders along with her left arm. She actually came to the ER on Saturday with negative workup but was told to return if he had continued symptoms, which she had. In the ER she had a CT dissection protocol given some radiation to her back/shoulder blades which was negative for dissection. Troponins were mildly eelvated was mildly elevated. ECG showed sinus, 1st degree AVB, prolonged QT. Her QT has improved. She underwent cath 3/12 demonstrating severe proximal LAD stenosis treated with PCI/subsequent DES. EF was 45%. DAPT with ASA/Brilinta was recommended for at least 12 months. Today she is doing well. Dr. Elease Hashimoto has seen and examined her today and feels she is stable for discharge. Would consider ACEI at f/u appt (BP's somewhat borderline while in hospital so will defer to OP setting.)  Of note, the patient had an 8 mm subpleural nodular density within the right upper lobe by her CT. Recommendation for f/u CT is for 6-12 months. The patient was informed of this  and instructed to f/u with her pulm doctor Dr. Maple Hudson. TSH was also elevated and needs to be rechecked. She was also instructed to f/u PCP for this as well.   Discharge Vitals: Blood pressure 114/67, pulse 56, temperature 98.3 F (36.8 C), temperature source Oral, resp. rate 14, height 5\' 1"  (1.549 m), weight 138 lb 7.2 oz (62.8 kg), SpO2 99.00%.  Labs: Lab Results  Component Value Date   WBC 7.2 08/15/2012   HGB 15.7* 08/15/2012   HCT 45.3 08/15/2012   MCV 91.1 08/15/2012   PLT 193 08/15/2012     Recent Labs Lab 08/14/12 0551 08/15/12 0640  NA 141 142  K 3.9 3.6  CL 106 103  CO2 24 27  BUN 12 13  CREATININE 0.69 0.92  CALCIUM 8.0* 9.1  PROT 6.3  --   BILITOT 0.7  --   ALKPHOS 61  --   ALT 20  --   AST 23  --   GLUCOSE 112* 107*    Recent Labs  08/13/12 2357 08/14/12 0551 08/14/12 1536  TROPONINI 0.38* 0.33* 0.69*   Lab Results  Component Value Date   CHOL 287* 03/08/2012   HDL 50.60 03/08/2012   LDLCALC 162* 11/23/2010   TRIG 154.0* 03/08/2012     Diagnostic Studies/Procedures   1. Cardiac catheterization this admission, please see full report and above for summary.  2. Chest 2 View 08/10/2012  *RADIOLOGY REPORT*  Clinical Data: Chest pain, short of breath  CHEST - 2 VIEW  Comparison: Prior chest x-ray 11/22/2010  Findings: The lungs are well-aerated and free from pulmonary edema, focal airspace consolidation or pulmonary nodule.  Chronic left basilar atelectasis or scarring is similar to prior exams. Cardiac and mediastinal contours are within normal limits.  No pneumothorax, or pleural effusion. No acute osseous findings.  IMPRESSION:  No acute cardiopulmonary disease.   Original Report Authenticated By: Malachy Moan, M.D.   3. Dg Chest Port 1 View3/05/2013  *RADIOLOGY REPORT*  Clinical Data: Chest pain, shortness of breath tonight  PORTABLE CHEST ONE-VIEW:  Comparison: Portable exam 0105 hours without priors for comparison  Findings:   Normal heart size and  pulmonary vascularity.  Tortuous aorta.  Lungs clear.  No pleural effusion or pneumothorax.  No acute osseous findings.  IMPRESSION:  No acute abnormalities. *RADIOLOGY REPORT*  Clinical Data: Chest pain, shortness of breath tonight  PORTABLE CHEST ONE-VIEW:  Comparison: Portable exam 0105 hours without priors for comparison  Findings: Normal heart size and pulmonary vascularity. Tortuous aorta. Lungs clear. No pleural effusion or pneumothorax. No acute osseous findings.  IMPRESSION: No acute abnormalities.   Original Report Authenticated By: Ulyses Southward, M.D.    4. Ct Angio Chest Aortic Dissect W &/or W/o 08/14/2012  *RADIOLOGY REPORT*  Clinical Data:  Chest pain  CT ANGIOGRAPHY CHEST, ABDOMEN AND PELVIS  Technique:  Multidetector CT imaging through the chest, abdomen and pelvis was performed using the standard protocol during bolus administration of intravenous contrast.  Multiplanar reconstructed images including MIPs were obtained and reviewed to evaluate the vascular anatomy.  Contrast: OMNIPAQUE IOHEXOL 350 MG/ML SOLN  Comparison:  11/11/2008 abdominal MRI  CTA CHEST  Findings:  Normal caliber aorta and branch vessels.  No aneurysmal dilatation or dissection.  Main pulmonary arterial branches are patent.  Heart size upper normal to mildly enlarged.  No intrathoracic lymphadenopathy.  No pleural or pericardial effusion.  Central airways are patent.  Mild bibasilar atelectasis or scarring.  Biapical scarring. 8 mm subpleural nodular density right upper lobe posteriorly on series 6 image 19.  No confluent airspace opacity.  No pneumothorax.   Review of the MIP images confirms the above findings.  IMPRESSION: No aortic dissection or acute intrathoracic process.  Heart size upper normal to mildly enlarged.  8 mm subpleural nodular density within the right upper lobe. If the patient is at high risk for bronchogenic carcinoma, follow-up chest CT at 3-6 months is recommended.  If the patient is at low risk for  bronchogenic carcinoma, follow-up chest CT at 6-12 months is recommended.  This recommendation follows the consensus statement: Guidelines for Management of Small Pulmonary Nodules Detected on CT Scans: A Statement from the Fleischner Society as published in Radiology 2005; 237:395-400.  CTA ABDOMEN AND PELVIS  Findings:  Intra-abdominal organ evaluation is limited in the arterial phase.  Question hepatic steatosis.  Unremarkable biliary system, spleen, pancreas, adrenal glands.  There are a couple too small to further characterize renal hypodensities.  No hydronephrosis or hydroureter.  No CT evidence for colitis.  Colonic diverticulosis.  Normal appendix.  Small bowel loops are normal course and caliber.  Thin-walled bladder.  Absent uterus.  No adnexal mass.  Normal caliber abdominal aorta.  Patent celiac axis, SMA, IMA, and iliac vessels.  Mild scattered atherosclerotic disease.  No acute osseous finding. Multi lumbar degenerative disc disease.  Review of the MIP images confirms the above findings.  IMPRESSION: Mild scattered atherosclerotic disease of the abdominal aorta and branch vessels.  No aneurysmal dilatation or dissection.  No acute abdominopelvic process identified by CT.   Original Report Authenticated By: Jearld Lesch, M.D.    5. Ct Angio Abd/pel W/ And/or W/o 08/14/2012  *RADIOLOGY REPORT*  Clinical Data:  Chest pain  CT ANGIOGRAPHY CHEST, ABDOMEN AND PELVIS  Technique:  Multidetector CT imaging through the chest, abdomen and pelvis was performed using the standard protocol during bolus administration of intravenous contrast.  Multiplanar reconstructed images including MIPs were obtained and reviewed to evaluate the vascular anatomy.  Contrast: OMNIPAQUE IOHEXOL 350 MG/ML SOLN  Comparison:  11/11/2008 abdominal MRI  CTA CHEST  Findings:  Normal caliber aorta and branch vessels.  No aneurysmal dilatation or dissection.  Main pulmonary arterial branches are patent.  Heart size upper normal  to mildly enlarged.  No intrathoracic lymphadenopathy.  No pleural or pericardial effusion.  Central airways are patent.  Mild bibasilar atelectasis or scarring.  Biapical scarring. 8 mm subpleural nodular density right upper lobe posteriorly on series 6 image 19.  No confluent airspace opacity.  No pneumothorax.   Review of the MIP images confirms the above findings.  IMPRESSION: No aortic dissection or acute intrathoracic process.  Heart size upper normal to mildly enlarged.  8 mm subpleural nodular density within the right upper lobe. If the patient is at high risk for bronchogenic carcinoma, follow-up chest CT at 3-6 months is recommended.  If the patient is at low risk for bronchogenic carcinoma, follow-up chest CT at 6-12 months is recommended.  This recommendation follows the consensus statement: Guidelines for Management of Small Pulmonary Nodules Detected on CT Scans: A Statement from the Fleischner Society as published in Radiology 2005; 237:395-400.  CTA ABDOMEN AND PELVIS  Findings:  Intra-abdominal organ evaluation is limited in the arterial phase.  Question hepatic steatosis.  Unremarkable biliary system, spleen, pancreas, adrenal glands.  There are a couple too small to further characterize renal hypodensities.  No hydronephrosis or hydroureter.  No CT evidence for colitis.  Colonic diverticulosis.  Normal appendix.  Small bowel loops are normal course and caliber.  Thin-walled bladder.  Absent uterus.  No adnexal mass.  Normal caliber abdominal aorta.  Patent celiac axis, SMA, IMA, and iliac vessels.  Mild scattered atherosclerotic disease.  No acute osseous finding. Multi lumbar degenerative disc disease.  Review of the MIP images confirms the above findings.  IMPRESSION: Mild scattered atherosclerotic disease of the abdominal aorta and branch vessels.  No aneurysmal dilatation or dissection.  No acute abdominopelvic process identified by CT.   Original Report Authenticated By: Jearld Lesch,  M.D.     Discharge Medications     Medication List    TAKE these medications       AMBULATORY NON FORMULARY MEDICATION  CPAP Machine  USES AS DIRECTED     amLODipine 5 MG tablet  Commonly known as:  NORVASC  Take 5 mg by mouth at bedtime.     aspirin 81 MG tablet  Take 1 tablet (81 mg total) by mouth daily.     atenolol 25 MG tablet  Commonly known as:  TENORMIN  Take 25 mg by mouth at bedtime.     cholecalciferol 1000 UNITS tablet  Commonly known as:  VITAMIN D  Take 1,000 Units by mouth daily.     estradiol 0.1 MG/GM vaginal cream  Commonly known as:  ESTRACE  Place 2 g vaginally 2 (two) times a week.     nitroGLYCERIN 0.4 MG SL tablet  Commonly known as:  NITROSTAT  Place 1 tablet (  0.4 mg total) under the tongue every 5 (five) minutes as needed for chest pain (up to 3 doses).     omeprazole 20 MG capsule  Commonly known as:  PRILOSEC  Take 20 mg by mouth daily.     propafenone 150 MG tablet  Commonly known as:  RYTHMOL  TAKE 1 TABLET BY MOUTH TWICE A DAY     Ticagrelor 90 MG Tabs tablet  Commonly known as:  BRILINTA  Take 1 tablet (90 mg total) by mouth 2 (two) times daily.        Disposition   The patient will be discharged in stable condition to home. Discharge Orders   Future Appointments [facility] Department Dept Phone   11/27/2012 10:00 AM Waymon Budge, MD [facility] [phone]   Future Orders Complete By Expires     Amb Referral to Cardiac Rehabilitation  As directed     Diet - low sodium heart healthy  As directed     Discharge instructions  As directed     Comments:      Your heart catheterization showed mild weakness of the heart muscle this admission. This may make you more susceptible to weight gain from fluid retention, which can lead to symptoms that we call heart failure.   For patients with this condition, we give them these special instructions:  1. Follow a low-salt diet and watch your fluid intake. In general, you  should not be taking in more than 2 liters of fluid per day. Some patients are restricted to less than 1.5 liters. This includes sources of water in foods like soup. 2. Weigh yourself on the same scale at same time of day and keep a log. 3. Call your doctor: (Anytime you feel any of the following symptoms)  - 3-4 pound weight gain in 1-2 days or 2 pounds overnight  - Shortness of breath, with or without a dry hacking cough  - Swelling in the hands, feet or stomach  - If you have to sleep on extra pillows at night in order to breathe    Increase activity slowly  As directed     Comments:      No driving for 1 week. No lifting over 10 lbs for 2 weeks. No sexual activity for 2 weeks. Keep procedure site clean & dry. If you notice increased pain, swelling, bleeding or pus, call/return!  You may shower, but no soaking baths/hot tubs/pools for 1 week.      Follow-up Information   Follow up with SPEAR, TAMMY, MD. (Your TSH was mildly elevated and needs to be rechecked. Discuss with primary doctor.)    Contact information:   [address] [place] [address] [phone]       Follow up with Norma Fredrickson, NP. (Our office will call you for a follow-up appointment)    Contact information:   [address] [place] [address] [phone] [facility]      Follow up with Waymon Budge, MD. (Your CT scan had a small possible pulmonary nodule - you may need a repeat CT in several months to make sure it does not change. Call pulmonary doctor to discuss next step.)    Contact information:   [address]  [facility] [place] [address] [phone]         Duration of Discharge Encounter: Greater than 30 minutes including physician and PA time.  Signed, Kriste Basque Dunn PA-C 08/15/2012, 1:06 PM  Attending Note:   The patient was seen and examined.  Agree with assessment and plan as noted above.  Changes made to the above note as  needed.  Pt is stable after PCI yesterday.  No angina.   Ambulated with rehab.  Vesta Mixer, Montez Hageman., MD, [facility] 08/15/2012, 5:48 PM

## 2012-08-15 NOTE — Progress Notes (Signed)
CARDIAC REHAB PHASE I   PRE:  Rate/Rhythm: 54 SB    BP: sitting 89/61    SaO2: 97 RA  MODE:  Ambulation: 500 ft   POST:  Rate/Rhythm: 70 SR    BP: sitting 131/75     SaO2:   Tolerated well. Slightly weak. BP low before walk but came up. Ed completed with good reception. Knows she needs to start ex and stay focused on diet since no statin. Interested in [facility] and will send referral to [facility].\ [id number]   Elissa Lovett Luther CES, ACSM 08/15/2012 8:56 AM

## 2012-08-16 ENCOUNTER — Emergency Department (HOSPITAL_COMMUNITY): Payer: Medicare Other

## 2012-08-16 ENCOUNTER — Inpatient Hospital Stay (HOSPITAL_COMMUNITY)
Admission: EM | Admit: 2012-08-16 | Discharge: 2012-08-20 | DRG: 287 | Disposition: A | Discharge status: Home / Self Care | Payer: Medicare Other | Attending: Internal Medicine | Admitting: Internal Medicine

## 2012-08-16 DIAGNOSIS — Z9861 Coronary angioplasty status: Secondary | ICD-10-CM

## 2012-08-16 DIAGNOSIS — Z79899 Other long term (current) drug therapy: Secondary | ICD-10-CM

## 2012-08-16 DIAGNOSIS — R0602 Shortness of breath: Secondary | ICD-10-CM

## 2012-08-16 DIAGNOSIS — K7689 Other specified diseases of liver: Secondary | ICD-10-CM | POA: Diagnosis present

## 2012-08-16 DIAGNOSIS — I2510 Atherosclerotic heart disease of native coronary artery without angina pectoris: Principal | ICD-10-CM | POA: Diagnosis present

## 2012-08-16 DIAGNOSIS — G4700 Insomnia, unspecified: Secondary | ICD-10-CM | POA: Diagnosis present

## 2012-08-16 DIAGNOSIS — I4891 Unspecified atrial fibrillation: Secondary | ICD-10-CM | POA: Diagnosis present

## 2012-08-16 DIAGNOSIS — I200 Unstable angina: Secondary | ICD-10-CM

## 2012-08-16 DIAGNOSIS — Z7902 Long term (current) use of antithrombotics/antiplatelets: Secondary | ICD-10-CM

## 2012-08-16 DIAGNOSIS — M810 Age-related osteoporosis without current pathological fracture: Secondary | ICD-10-CM | POA: Diagnosis present

## 2012-08-16 DIAGNOSIS — I10 Essential (primary) hypertension: Secondary | ICD-10-CM | POA: Diagnosis present

## 2012-08-16 DIAGNOSIS — M129 Arthropathy, unspecified: Secondary | ICD-10-CM | POA: Diagnosis present

## 2012-08-16 DIAGNOSIS — E785 Hyperlipidemia, unspecified: Secondary | ICD-10-CM | POA: Diagnosis present

## 2012-08-16 DIAGNOSIS — I214 Non-ST elevation (NSTEMI) myocardial infarction: Secondary | ICD-10-CM | POA: Diagnosis present

## 2012-08-16 DIAGNOSIS — R079 Chest pain, unspecified: Secondary | ICD-10-CM

## 2012-08-16 DIAGNOSIS — R911 Solitary pulmonary nodule: Secondary | ICD-10-CM | POA: Diagnosis present

## 2012-08-16 DIAGNOSIS — K219 Gastro-esophageal reflux disease without esophagitis: Secondary | ICD-10-CM | POA: Diagnosis present

## 2012-08-16 DIAGNOSIS — G4733 Obstructive sleep apnea (adult) (pediatric): Secondary | ICD-10-CM | POA: Diagnosis present

## 2012-08-16 DIAGNOSIS — Z7982 Long term (current) use of aspirin: Secondary | ICD-10-CM

## 2012-08-16 LAB — CBC WITH DIFFERENTIAL/PLATELET
Eosinophils Absolute: 0.1 10*3/uL (ref 0.0–0.7)
Eosinophils Relative: 1 % (ref 0–5)
HCT: 39.9 % (ref 36.0–46.0)
Hemoglobin: 13.8 g/dL (ref 12.0–15.0)
Lymphocytes Relative: 24 % (ref 12–46)
Lymphs Abs: 1.8 10*3/uL (ref 0.7–4.0)
MCH: 30.9 pg (ref 26.0–34.0)
MCV: 89.5 fL (ref 78.0–100.0)
Monocytes Absolute: 0.6 10*3/uL (ref 0.1–1.0)
Monocytes Relative: 8 % (ref 3–12)
RBC: 4.46 MIL/uL (ref 3.87–5.11)
WBC: 7.3 10*3/uL (ref 4.0–10.5)

## 2012-08-16 LAB — COMPREHENSIVE METABOLIC PANEL
ALT: 21 U/L (ref 0–35)
AST: 22 U/L (ref 0–37)
CO2: 24 mEq/L (ref 19–32)
Calcium: 8.4 mg/dL (ref 8.4–10.5)
Chloride: 104 mEq/L (ref 96–112)
Creatinine, Ser: 0.84 mg/dL (ref 0.50–1.10)
GFR calc Af Amer: 82 mL/min — ABNORMAL LOW (ref >90)
GFR calc non Af Amer: 71 mL/min — ABNORMAL LOW (ref >90)
Glucose, Bld: 101 mg/dL — ABNORMAL HIGH (ref 70–99)
Total Bilirubin: 0.5 mg/dL (ref 0.3–1.2)

## 2012-08-16 LAB — POCT I-STAT TROPONIN I

## 2012-08-16 MED ORDER — PROPAFENONE HCL 150 MG PO TABS
150.0000 mg | ORAL_TABLET | Freq: Once | ORAL | Status: AC
  Administered 2012-08-16: 150 mg via ORAL
  Filled 2012-08-16: qty 1

## 2012-08-16 MED ORDER — TICAGRELOR 90 MG PO TABS
90.0000 mg | ORAL_TABLET | Freq: Once | ORAL | Status: AC
  Administered 2012-08-16: 90 mg via ORAL
  Filled 2012-08-16: qty 1

## 2012-08-16 MED ORDER — NITROGLYCERIN IN D5W 200-5 MCG/ML-% IV SOLN
2.0000 ug/min | Freq: Once | INTRAVENOUS | Status: AC
  Administered 2012-08-16: 10 ug/min via INTRAVENOUS
  Filled 2012-08-16: qty 250

## 2012-08-16 NOTE — ED Provider Notes (Signed)
History     CSN: [id number]  Arrival date & time 08/16/12  2025   First MD Initiated Contact with Patient 08/16/12 2059      Chief Complaint  Patient presents with  . Chest Pain    (Consider location/radiation/quality/duration/timing/severity/associated sxs/prior treatment) HPI  Past Medical History  Diagnosis Date  . OSA on CPAP   . Hyperlipidemia   . Hypertension   . PAF (paroxysmal atrial fibrillation)     On rythmol.  . Tubular adenoma of colon 2007  . CAD (coronary artery disease)     a. prior stenting history. b. NSTEMI s/p DES to prox LAD with EF 45% by cath 08/14/12  . Osteoporosis   . Fatty infiltration of liver   . GERD (gastroesophageal reflux disease)   . Arthritis   . Pulmonary nodule     a. 8mm subpleural nodular density CT 08/2012, instructed to f/u pulm MD.    Past Surgical History  Procedure Laterality Date  . Total abdominal hysterectomy    . Tonsillectomy    . Coronary stent placement  2000  . Coronary angioplasty with stent placement  08/14/2012    LAD  Dr Tonny Bollman  . Rotator cuff repair      Family History  Problem Relation Age of Onset  . Breast cancer Maternal Aunt   . Heart disease Father   . Alzheimer's disease Mother   . Colon cancer Neg Hx     History  Substance Use Topics  . Smoking status: Never Smoker   . Smokeless tobacco: Never Used  . Alcohol Use: Yes     Comment: 2 drinks daily     OB History   Grav Para Term Preterm Abortions TAB SAB Ect Mult Living                  Review of Systems  Allergies  Statins  Home Medications   Current Outpatient Rx  Name  Route  Sig  Dispense  Refill  . amLODipine (NORVASC) 5 MG tablet   Oral   Take 5 mg by mouth at bedtime.         Marland Kitchen aspirin 81 MG tablet   Oral   Take 1 tablet (81 mg total) by mouth daily.         Marland Kitchen atenolol (TENORMIN) 25 MG tablet   Oral   Take 25 mg by mouth at bedtime.         . cholecalciferol (VITAMIN D) 1000 UNITS tablet   Oral    Take 1,000 Units by mouth daily.         Marland Kitchen estradiol (ESTRACE) 0.1 MG/GM vaginal cream   Vaginal   Place 2 g vaginally 2 (two) times a week. Days of the week vary         . nitroGLYCERIN (NITROSTAT) 0.4 MG SL tablet   Sublingual   Place 1 tablet (0.4 mg total) under the tongue every 5 (five) minutes as needed for chest pain (up to 3 doses).   25 tablet   4   . omeprazole (PRILOSEC) 20 MG capsule   Oral   Take 20 mg by mouth daily.         . propafenone (RYTHMOL) 150 MG tablet   Oral   Take 150 mg by mouth 2 (two) times daily.         . Ticagrelor (BRILINTA) 90 MG TABS tablet   Oral   Take 1 tablet (90 mg total) by mouth 2 (two)  times daily.   60 tablet   10   . AMBULATORY NON FORMULARY MEDICATION      CPAP Machine USES AS DIRECTED           BP 139/78  Pulse 61  Temp(Src) 97.7 F (36.5 C) (Oral)  Resp 11  SpO2 100%  Physical Exam  ED Course  Procedures (including critical care time)  Labs Reviewed  PRO B NATRIURETIC PEPTIDE - Abnormal; Notable for the following:    Pro B Natriuretic peptide (BNP) 639.3 (*)    All other components within normal limits  COMPREHENSIVE METABOLIC PANEL - Abnormal; Notable for the following:    Glucose, Bld 101 (*)    GFR calc non Af Amer 71 (*)    GFR calc Af Amer 82 (*)    All other components within normal limits  POCT I-STAT TROPONIN I - Abnormal; Notable for the following:    Troponin i, poc 0.27 (*)    All other components within normal limits  POCT I-STAT TROPONIN I - Abnormal; Notable for the following:    Troponin i, poc 0.26 (*)    All other components within normal limits  CBC WITH DIFFERENTIAL   Dg Chest 2 View  08/16/2012  *RADIOLOGY REPORT*  Clinical Data: Left-sided chest pain.  Short of breath.  Coronary artery disease.  CHEST - 2 VIEW  Comparison: 08/14/2012  Findings: Heart size is within normal limits.  Both lungs are clear.  No evidence of pleural effusion.  Mild tortuosity of thoracic aorta is  stable.  No mass or lymphadenopathy identified.  IMPRESSION: Stable exam.  No active disease.   Original Report Authenticated By: Myles Rosenthal, M.D.    Results for orders placed during the hospital encounter of 08/16/12  CBC WITH DIFFERENTIAL      Result Value Range   WBC 7.3  4.0 - 10.5 K/uL   RBC 4.46  3.87 - 5.11 MIL/uL   Hemoglobin 13.8  12.0 - 15.0 g/dL   HCT 29.5  28.4 - 13.2 %   MCV 89.5  78.0 - 100.0 fL   MCH 30.9  26.0 - 34.0 pg   MCHC 34.6  30.0 - 36.0 g/dL   RDW 44.0  10.2 - 72.5 %   Platelets 167  150 - 400 K/uL   Neutrophils Relative 67  43 - 77 %   Neutro Abs 4.9  1.7 - 7.7 K/uL   Lymphocytes Relative 24  12 - 46 %   Lymphs Abs 1.8  0.7 - 4.0 K/uL   Monocytes Relative 8  3 - 12 %   Monocytes Absolute 0.6  0.1 - 1.0 K/uL   Eosinophils Relative 1  0 - 5 %   Eosinophils Absolute 0.1  0.0 - 0.7 K/uL   Basophils Relative 0  0 - 1 %   Basophils Absolute 0.0  0.0 - 0.1 K/uL  PRO B NATRIURETIC PEPTIDE      Result Value Range   Pro B Natriuretic peptide (BNP) 639.3 (*) 0 - 125 pg/mL  COMPREHENSIVE METABOLIC PANEL      Result Value Range   Sodium 138  135 - 145 mEq/L   Potassium 3.6  3.5 - 5.1 mEq/L   Chloride 104  96 - 112 mEq/L   CO2 24  19 - 32 mEq/L   Glucose, Bld 101 (*) 70 - 99 mg/dL   BUN 14  6 - 23 mg/dL   Creatinine, Ser 3.66  0.50 - 1.10 mg/dL   Calcium  8.4  8.4 - 10.5 mg/dL   Total Protein 6.7  6.0 - 8.3 g/dL   Albumin 3.8  3.5 - 5.2 g/dL   AST 22  0 - 37 U/L   ALT 21  0 - 35 U/L   Alkaline Phosphatase 62  39 - 117 U/L   Total Bilirubin 0.5  0.3 - 1.2 mg/dL   GFR calc non Af Amer 71 (*) >90 mL/min   GFR calc Af Amer 82 (*) >90 mL/min  POCT I-STAT TROPONIN I      Result Value Range   Troponin i, poc 0.27 (*) 0.00 - 0.08 ng/mL   Comment NOTIFIED PHYSICIAN     Comment 3           POCT I-STAT TROPONIN I      Result Value Range   Troponin i, poc 0.26 (*) 0.00 - 0.08 ng/mL   Comment NOTIFIED PHYSICIAN     Comment 3            Dg Chest 2 View  08/16/2012   *RADIOLOGY REPORT*  Clinical Data: Left-sided chest pain.  Short of breath.  Coronary artery disease.  CHEST - 2 VIEW  Comparison: 08/14/2012  Findings: Heart size is within normal limits.  Both lungs are clear.  No evidence of pleural effusion.  Mild tortuosity of thoracic aorta is stable.  No mass or lymphadenopathy identified.  IMPRESSION: Stable exam.  No active disease.   Original Report Authenticated By: Myles Rosenthal, M.D.    Dg Chest 2 View  08/10/2012  *RADIOLOGY REPORT*  Clinical Data: Chest pain, short of breath  CHEST - 2 VIEW  Comparison: Prior chest x-ray 11/22/2010  Findings: The lungs are well-aerated and free from pulmonary edema, focal airspace consolidation or pulmonary nodule.  Chronic left basilar atelectasis or scarring is similar to prior exams. Cardiac and mediastinal contours are within normal limits.  No pneumothorax, or pleural effusion. No acute osseous findings.  IMPRESSION:  No acute cardiopulmonary disease.   Original Report Authenticated By: Malachy Moan, M.D.    Dg Chest Port 1 View  08/14/2012  *RADIOLOGY REPORT*  Clinical Data: Chest pain, shortness of breath tonight  PORTABLE CHEST ONE-VIEW:  Comparison: Portable exam 0105 hours without priors for comparison  Findings:   Normal heart size and pulmonary vascularity.  Tortuous aorta.  Lungs clear.  No pleural effusion or pneumothorax.  No acute osseous findings.  IMPRESSION:  No acute abnormalities. *RADIOLOGY REPORT*  Clinical Data: Chest pain, shortness of breath tonight  PORTABLE CHEST ONE-VIEW:  Comparison: Portable exam 0105 hours without priors for comparison  Findings: Normal heart size and pulmonary vascularity. Tortuous aorta. Lungs clear. No pleural effusion or pneumothorax. No acute osseous findings.  IMPRESSION: No acute abnormalities.   Original Report Authenticated By: Ulyses Southward, M.D.    Ct Angio Chest Aortic Dissect W &/or W/o  08/14/2012  *RADIOLOGY REPORT*  Clinical Data:  Chest pain  CT ANGIOGRAPHY  CHEST, ABDOMEN AND PELVIS  Technique:  Multidetector CT imaging through the chest, abdomen and pelvis was performed using the standard protocol during bolus administration of intravenous contrast.  Multiplanar reconstructed images including MIPs were obtained and reviewed to evaluate the vascular anatomy.  Contrast: OMNIPAQUE IOHEXOL 350 MG/ML SOLN  Comparison:  11/11/2008 abdominal MRI  CTA CHEST  Findings:  Normal caliber aorta and branch vessels.  No aneurysmal dilatation or dissection.  Main pulmonary arterial branches are patent.  Heart size upper normal to mildly enlarged.  No intrathoracic lymphadenopathy.  No pleural or pericardial effusion.  Central airways are patent.  Mild bibasilar atelectasis or scarring.  Biapical scarring. 8 mm subpleural nodular density right upper lobe posteriorly on series 6 image 19.  No confluent airspace opacity.  No pneumothorax.   Review of the MIP images confirms the above findings.  IMPRESSION: No aortic dissection or acute intrathoracic process.  Heart size upper normal to mildly enlarged.  8 mm subpleural nodular density within the right upper lobe. If the patient is at high risk for bronchogenic carcinoma, follow-up chest CT at 3-6 months is recommended.  If the patient is at low risk for bronchogenic carcinoma, follow-up chest CT at 6-12 months is recommended.  This recommendation follows the consensus statement: Guidelines for Management of Small Pulmonary Nodules Detected on CT Scans: A Statement from the Fleischner Society as published in Radiology 2005; 237:395-400.  CTA ABDOMEN AND PELVIS  Findings:  Intra-abdominal organ evaluation is limited in the arterial phase.  Question hepatic steatosis.  Unremarkable biliary system, spleen, pancreas, adrenal glands.  There are a couple too small to further characterize renal hypodensities.  No hydronephrosis or hydroureter.  No CT evidence for colitis.  Colonic diverticulosis.  Normal appendix.  Small bowel loops are  normal course and caliber.  Thin-walled bladder.  Absent uterus.  No adnexal mass.  Normal caliber abdominal aorta.  Patent celiac axis, SMA, IMA, and iliac vessels.  Mild scattered atherosclerotic disease.  No acute osseous finding. Multi lumbar degenerative disc disease.  Review of the MIP images confirms the above findings.  IMPRESSION: Mild scattered atherosclerotic disease of the abdominal aorta and branch vessels.  No aneurysmal dilatation or dissection.  No acute abdominopelvic process identified by CT.   Original Report Authenticated By: Jearld Lesch, M.D.    Ct Angio Abd/pel W/ And/or W/o  08/14/2012  *RADIOLOGY REPORT*  Clinical Data:  Chest pain  CT ANGIOGRAPHY CHEST, ABDOMEN AND PELVIS  Technique:  Multidetector CT imaging through the chest, abdomen and pelvis was performed using the standard protocol during bolus administration of intravenous contrast.  Multiplanar reconstructed images including MIPs were obtained and reviewed to evaluate the vascular anatomy.  Contrast: OMNIPAQUE IOHEXOL 350 MG/ML SOLN  Comparison:  11/11/2008 abdominal MRI  CTA CHEST  Findings:  Normal caliber aorta and branch vessels.  No aneurysmal dilatation or dissection.  Main pulmonary arterial branches are patent.  Heart size upper normal to mildly enlarged.  No intrathoracic lymphadenopathy.  No pleural or pericardial effusion.  Central airways are patent.  Mild bibasilar atelectasis or scarring.  Biapical scarring. 8 mm subpleural nodular density right upper lobe posteriorly on series 6 image 19.  No confluent airspace opacity.  No pneumothorax.   Review of the MIP images confirms the above findings.  IMPRESSION: No aortic dissection or acute intrathoracic process.  Heart size upper normal to mildly enlarged.  8 mm subpleural nodular density within the right upper lobe. If the patient is at high risk for bronchogenic carcinoma, follow-up chest CT at 3-6 months is recommended.  If the patient is at low risk for  bronchogenic carcinoma, follow-up chest CT at 6-12 months is recommended.  This recommendation follows the consensus statement: Guidelines for Management of Small Pulmonary Nodules Detected on CT Scans: A Statement from the Fleischner Society as published in Radiology 2005; 237:395-400.  CTA ABDOMEN AND PELVIS  Findings:  Intra-abdominal organ evaluation is limited in the arterial phase.  Question hepatic steatosis.  Unremarkable biliary system, spleen, pancreas, adrenal glands.  There are a  couple too small to further characterize renal hypodensities.  No hydronephrosis or hydroureter.  No CT evidence for colitis.  Colonic diverticulosis.  Normal appendix.  Small bowel loops are normal course and caliber.  Thin-walled bladder.  Absent uterus.  No adnexal mass.  Normal caliber abdominal aorta.  Patent celiac axis, SMA, IMA, and iliac vessels.  Mild scattered atherosclerotic disease.  No acute osseous finding. Multi lumbar degenerative disc disease.  Review of the MIP images confirms the above findings.  IMPRESSION: Mild scattered atherosclerotic disease of the abdominal aorta and branch vessels.  No aneurysmal dilatation or dissection.  No acute abdominopelvic process identified by CT.   Original Report Authenticated By: Jearld Lesch, M.D.     Chest xray reviewed by me.  No diagnosis found. At 10:40 PM pain is improved after treatment with intravenous nitroglycerin drip.   MDM  Dr.Balfour from cardiology arrived to evaluate patient and arrange for admission Diagnosis unstable angina        Doug Sou, MD 08/16/12 2247

## 2012-08-16 NOTE — H&P (Signed)
Cardiology History and Physical  Cummings, TAMMY, MD  History of Present Illness (and review of medical records): Shannon Cummings is a 67 y.o. female history of hypertension, dyslipidemia, paroxysmal atrial fibrillation, CAD with prior stent, OSA on CPAP who presents for evaluation of chest pain.  She was recently admitted 3/12 with NSTEMI found to have prox LAD 99% stenosis s/p DES.  She was d/c on 3/14 home.  She states return of anginal chest pain around 7pm tonight.  Pain was left sided and rated 8/10.  Pain was associated with shortness of breath only.  She took ASA 325 and 3 Nitro with some relief.  Pain came down to 4/10.  In ED she was given Morphine and placed on Nitro gtt for continued pain.  Her pain is now 3/10.  She denied any PND, orthopnea, LE swelling, palpations or syncope.  Review of Systems Further review of systems was otherwise negative other than stated in HPI.  Patient Active Problem List   Diagnosis Date Noted  . NSTEMI (non-ST elevated myocardial infarction) 08/14/2012  . Hepatic steatosis 12/03/2011  . Atrial fibrillation 12/22/2010  . Coronary artery disease 12/22/2010  . INSOMNIA 04/13/2010  . HYPERLIPIDEMIA 03/19/2010  . HYPERTENSION 03/19/2010  . OBSTRUCTIVE SLEEP APNEA 03/17/2010   Past Medical History  Diagnosis Date  . OSA on CPAP   . Hyperlipidemia   . Hypertension   . PAF (paroxysmal atrial fibrillation)     On rythmol.  . Tubular adenoma of colon 2007  . CAD (coronary artery disease)     a. prior stenting history. b. NSTEMI s/p DES to prox LAD with EF 45% by cath 08/14/12  . Osteoporosis   . Fatty infiltration of liver   . GERD (gastroesophageal reflux disease)   . Arthritis   . Pulmonary nodule     a. 8mm subpleural nodular density CT 08/2012, instructed to f/u pulm MD.    Past Surgical History  Procedure Laterality Date  . Total abdominal hysterectomy    . Tonsillectomy    . Coronary stent placement  2000  . Coronary angioplasty with stent  placement  08/14/2012    LAD  Dr Tonny Bollman  . Rotator cuff repair      Prescriptions prior to admission  Medication Sig Dispense Refill  . amLODipine (NORVASC) 5 MG tablet Take 5 mg by mouth at bedtime.      Marland Kitchen aspirin 81 MG tablet Take 1 tablet (81 mg total) by mouth daily.      Marland Kitchen atenolol (TENORMIN) 25 MG tablet Take 25 mg by mouth at bedtime.      . cholecalciferol (VITAMIN D) 1000 UNITS tablet Take 1,000 Units by mouth daily.      Marland Kitchen estradiol (ESTRACE) 0.1 MG/GM vaginal cream Place 2 g vaginally 2 (two) times a week. Days of the week vary      . nitroGLYCERIN (NITROSTAT) 0.4 MG SL tablet Place 1 tablet (0.4 mg total) under the tongue every 5 (five) minutes as needed for chest pain (up to 3 doses).  25 tablet  4  . omeprazole (PRILOSEC) 20 MG capsule Take 20 mg by mouth daily.      . propafenone (RYTHMOL) 150 MG tablet Take 150 mg by mouth 2 (two) times daily.      . Ticagrelor (BRILINTA) 90 MG TABS tablet Take 1 tablet (90 mg total) by mouth 2 (two) times daily.  60 tablet  10  . AMBULATORY NON FORMULARY MEDICATION CPAP Machine USES AS DIRECTED  Allergies  Allergen Reactions  . Statins Other (See Comments)    Restless legs, bad feeling    History  Substance Use Topics  . Smoking status: Never Smoker   . Smokeless tobacco: Never Used  . Alcohol Use: Yes     Comment: 2 drinks daily     Family History  Problem Relation Age of Onset  . Breast cancer Maternal Aunt   . Heart disease Father   . Alzheimer's disease Mother   . Colon cancer Neg Hx      Objective: Patient Vitals for the past 8 hrs:  BP Temp Temp src Pulse Resp SpO2 Height Weight  08/17/12 0351 92/66 mmHg - - 61 17 95 % - -  08/17/12 0300 - 98.3 F (36.8 C) Oral - - - - -  08/17/12 0239 - - - - - - 5' (1.524 m) 62.2 kg (137 lb 2 oz)  08/17/12 0215 109/73 mmHg - - 60 15 97 % - -  08/17/12 0200 126/78 mmHg - - 59 15 96 % - -  08/17/12 0145 138/80 mmHg - - 57 21 99 % - -  08/17/12 0125 - - - 58 13 99 % -  -  08/17/12 0118 - 98.1 F (36.7 C) Oral - - - 5' (1.524 m) 62.2 kg (137 lb 2 oz)  08/17/12 0115 114/72 mmHg - - 58 13 97 % - -  08/17/12 0015 106/65 mmHg - - 60 11 97 % - -  08/17/12 0000 103/68 mmHg - - 59 17 97 % - -  08/16/12 2345 108/65 mmHg - - 64 15 96 % - -  08/16/12 2330 117/71 mmHg - - 64 9 96 % - -  08/16/12 2315 122/77 mmHg - - 60 11 97 % - -  08/16/12 2300 114/73 mmHg - - 62 13 96 % - -  08/16/12 2245 119/72 mmHg - - 59 15 96 % - -  08/16/12 2230 118/68 mmHg - - 61 15 98 % - -   General Appearance:    Alert, cooperative, mild distress, appears stated age  Head:    Normocephalic, without obvious abnormality, atraumatic  Eyes:     PERRL, EOMI, anicteric sclerae  Neck:   Supple, positive HJR  Lungs:     Clear to auscultation bilaterally, respirations unlabored  Heart:    Regular rate and rhythm, S1 and S2 normal, no murmur  Abdomen:     Soft, non-tender, normoactive bowel sounds  Extremities:   Extremities normal, atraumatic, no cyanosis or edema  Pulses:   2+ and symmetric all extremities  Skin:   no rashes or lesions  Neurologic:   No focal deficits. AAO x3   Results for orders placed during the hospital encounter of 08/16/12 (from the past 48 hour(s))  CBC WITH DIFFERENTIAL     Status: None   Collection Time    08/16/12  8:45 PM      Result Value Range   WBC 7.3  4.0 - 10.5 K/uL   RBC 4.46  3.87 - 5.11 MIL/uL   Hemoglobin 13.8  12.0 - 15.0 g/dL   HCT 16.1  09.6 - 04.5 %   MCV 89.5  78.0 - 100.0 fL   MCH 30.9  26.0 - 34.0 pg   MCHC 34.6  30.0 - 36.0 g/dL   RDW 40.9  81.1 - 91.4 %   Platelets 167  150 - 400 K/uL   Neutrophils Relative 67  43 - 77 %  Neutro Abs 4.9  1.7 - 7.7 K/uL   Lymphocytes Relative 24  12 - 46 %   Lymphs Abs 1.8  0.7 - 4.0 K/uL   Monocytes Relative 8  3 - 12 %   Monocytes Absolute 0.6  0.1 - 1.0 K/uL   Eosinophils Relative 1  0 - 5 %   Eosinophils Absolute 0.1  0.0 - 0.7 K/uL   Basophils Relative 0  0 - 1 %   Basophils Absolute 0.0  0.0  - 0.1 K/uL  COMPREHENSIVE METABOLIC PANEL     Status: Abnormal   Collection Time    08/16/12  8:45 PM      Result Value Range   Sodium 138  135 - 145 mEq/L   Potassium 3.6  3.5 - 5.1 mEq/L   Chloride 104  96 - 112 mEq/L   CO2 24  19 - 32 mEq/L   Glucose, Bld 101 (*) 70 - 99 mg/dL   BUN 14  6 - 23 mg/dL   Creatinine, Ser 7.82  0.50 - 1.10 mg/dL   Calcium 8.4  8.4 - 95.6 mg/dL   Total Protein 6.7  6.0 - 8.3 g/dL   Albumin 3.8  3.5 - 5.2 g/dL   AST 22  0 - 37 U/L   ALT 21  0 - 35 U/L   Alkaline Phosphatase 62  39 - 117 U/L   Total Bilirubin 0.5  0.3 - 1.2 mg/dL   GFR calc non Af Amer 71 (*) >90 mL/min   GFR calc Af Amer 82 (*) >90 mL/min   Comment:            The eGFR has been calculated     using the CKD EPI equation.     This calculation has not been     validated in all clinical     situations.     eGFR's persistently     <90 mL/min signify     possible Chronic Kidney Disease.  PRO B NATRIURETIC PEPTIDE     Status: Abnormal   Collection Time    08/16/12  8:46 PM      Result Value Range   Pro B Natriuretic peptide (BNP) 639.3 (*) 0 - 125 pg/mL  POCT I-STAT TROPONIN I     Status: Abnormal   Collection Time    08/16/12  9:06 PM      Result Value Range   Troponin i, poc 0.27 (*) 0.00 - 0.08 ng/mL   Comment NOTIFIED PHYSICIAN     Comment 3            Comment: Due to the release kinetics of cTnI,     a negative result within the first hours     of the onset of symptoms does not rule out     myocardial infarction with certainty.     If myocardial infarction is still suspected,     repeat the test at appropriate intervals.  POCT I-STAT TROPONIN I     Status: Abnormal   Collection Time    08/16/12  9:28 PM      Result Value Range   Troponin i, poc 0.26 (*) 0.00 - 0.08 ng/mL   Comment NOTIFIED PHYSICIAN     Comment 3            Comment: Due to the release kinetics of cTnI,     a negative result within the first hours     of the onset of symptoms does not  rule out      myocardial infarction with certainty.     If myocardial infarction is still suspected,     repeat the test at appropriate intervals.  TROPONIN I     Status: Abnormal   Collection Time    08/17/12  1:23 AM      Result Value Range   Troponin I 0.53 (*) <0.30 ng/mL   Comment:            Due to the release kinetics of cTnI,     a negative result within the first hours     of the onset of symptoms does not rule out     myocardial infarction with certainty.     If myocardial infarction is still suspected,     repeat the test at appropriate intervals.     CRITICAL VALUE NOTED.  VALUE IS CONSISTENT WITH PREVIOUSLY REPORTED AND CALLED VALUE.  CBC     Status: None   Collection Time    08/17/12  1:23 AM      Result Value Range   WBC 6.9  4.0 - 10.5 K/uL   RBC 4.40  3.87 - 5.11 MIL/uL   Hemoglobin 13.7  12.0 - 15.0 g/dL   HCT 78.2  95.6 - 21.3 %   MCV 89.1  78.0 - 100.0 fL   MCH 31.1  26.0 - 34.0 pg   MCHC 34.9  30.0 - 36.0 g/dL   RDW 08.6  57.8 - 46.9 %   Platelets 170  150 - 400 K/uL  CREATININE, SERUM     Status: Abnormal   Collection Time    08/17/12  1:23 AM      Result Value Range   Creatinine, Ser 0.71  0.50 - 1.10 mg/dL   GFR calc non Af Amer 88 (*) >90 mL/min   GFR calc Af Amer >90  >90 mL/min   Comment:            The eGFR has been calculated     using the CKD EPI equation.     This calculation has not been     validated in all clinical     situations.     eGFR's persistently     <90 mL/min signify     possible Chronic Kidney Disease.   Dg Chest 2 View  08/16/2012  *RADIOLOGY REPORT*  Clinical Data: Left-sided chest pain.  Short of breath.  Coronary artery disease.  CHEST - 2 VIEW  Comparison: 08/14/2012  Findings: Heart size is within normal limits.  Both lungs are clear.  No evidence of pleural effusion.  Mild tortuosity of thoracic aorta is stable.  No mass or lymphadenopathy identified.  IMPRESSION: Stable exam.  No active disease.   Original Report Authenticated By:  Myles Rosenthal, M.D.    ECG: Sinus brady PR200. TWA anterior leads, improved TWA in lateral leads from recent admission.  Cath: PCI Data:  Vessel - LAD/Segment - proximal  Percent Stenosis (pre) 99  TIMI-flow 2  Stent 3.0 x 20 mm drug-eluting  Percent Stenosis (post) 0  TIMI-flow (post) 3  Final Conclusions:  1. Severe proximal LAD stenosis with successful PCI as above  2. Widely patent left main, left circumflex, and right coronary arteries  3. Mild to moderate segmental left ventricular systolic dysfunction   Assessment: 61F with recent NSTEMI s/p DES to LAD returns with chest pain and shortness of breath.  Troponin and BNP trending down.  Will place on observation.  Plan: 1.  Observation 2. Continuous monitoring on Telemetry. 3. Repeat ekg on admit, prn chest pain or arrythmia 4. Trend cardiac biomarkers 5. Medical management to include ASA, Brilinta, BB, NTG prn 6. TTE in am evaluate for effusion 7. Will give small dose of Lasix, monitor I/Os, daily weights. 8. If BP stable consider add ACEI prior to d/c 9. Rythmol for hx of Afib 10. CPAP for OSA

## 2012-08-16 NOTE — ED Notes (Signed)
Pt c/o chest pain and shortness of breath today.  Pt had stent placed this past Wednesday. Pt localizes pain to center of chest.  Prior to EMS arrival, pt took 324 of ASA and 3 nitros.  Initially pain was 8/10 (Pressure and heaviness).  After three nitros, pt's pain went down to 4/10.  With EMS pt received 2 more nitros.  Pain now 0/10.  Pt also received 6mg  of Morphine, 4 mg of Zofran, and 100 cc NS.  Skin warm and dry upon arrival.  VSS.

## 2012-08-16 NOTE — ED Notes (Signed)
Pt ambulated to restroom with no difficulty or shortness of breath noted.

## 2012-08-16 NOTE — ED Provider Notes (Signed)
History     CSN: [id number]  Arrival date & time 08/16/12  2025   First MD Initiated Contact with Patient 08/16/12 2059      Chief Complaint  Patient presents with  . Chest Pain    (Consider location/radiation/quality/duration/timing/severity/associated sxs/prior treatment) HPI Place of anterior chest pain, nonradiating feels like angina she's had in the past. Associated symptoms include shortness of breath no nausea or sweatiness patient describes the discomfort as heaviness. Nonradiating. She treated herself with 3 sublingual nitroglycerin at home and received 3 more sublingual nitroglycerin by EMS. She also received 4 baby aspirins en route. Presently discomfort is minimal. Symptoms not made better or worse by anything it was onset at rest today. Patient is status post coronary stent 2 days ago. Past Medical History  Diagnosis Date  . OSA on CPAP   . Hyperlipidemia   . Hypertension   . PAF (paroxysmal atrial fibrillation)     On rythmol.  . Tubular adenoma of colon 2007  . CAD (coronary artery disease)     a. prior stenting history. b. NSTEMI s/p DES to prox LAD with EF 45% by cath 08/14/12  . Osteoporosis   . Fatty infiltration of liver   . GERD (gastroesophageal reflux disease)   . Arthritis   . Pulmonary nodule     a. 8mm subpleural nodular density CT 08/2012, instructed to f/u pulm MD.    Past Surgical History  Procedure Laterality Date  . Total abdominal hysterectomy    . Tonsillectomy    . Coronary stent placement  2000  . Coronary angioplasty with stent placement  08/14/2012    LAD  Dr Tonny Bollman  . Rotator cuff repair      Family History  Problem Relation Age of Onset  . Breast cancer Maternal Aunt   . Heart disease Father   . Alzheimer's disease Mother   . Colon cancer Neg Hx     History  Substance Use Topics  . Smoking status: Never Smoker   . Smokeless tobacco: Never Used  . Alcohol Use: Yes     Comment: 2 drinks daily     OB History   Grav  Para Term Preterm Abortions TAB SAB Ect Mult Living                  Review of Systems  Constitutional: Negative.   HENT: Negative.   Respiratory: Positive for shortness of breath.   Cardiovascular: Positive for chest pain.  Gastrointestinal: Negative.   Musculoskeletal: Negative.   Skin: Negative.   Neurological: Negative.   Psychiatric/Behavioral: Negative.   All other systems reviewed and are negative.    Allergies  Statins  Home Medications   Current Outpatient Rx  Name  Route  Sig  Dispense  Refill  . amLODipine (NORVASC) 5 MG tablet   Oral   Take 5 mg by mouth at bedtime.         Marland Kitchen aspirin 81 MG tablet   Oral   Take 1 tablet (81 mg total) by mouth daily.         Marland Kitchen atenolol (TENORMIN) 25 MG tablet   Oral   Take 25 mg by mouth at bedtime.         . cholecalciferol (VITAMIN D) 1000 UNITS tablet   Oral   Take 1,000 Units by mouth daily.         Marland Kitchen estradiol (ESTRACE) 0.1 MG/GM vaginal cream   Vaginal   Place 2 g vaginally 2 (two)  times a week. Days of the week vary         . nitroGLYCERIN (NITROSTAT) 0.4 MG SL tablet   Sublingual   Place 1 tablet (0.4 mg total) under the tongue every 5 (five) minutes as needed for chest pain (up to 3 doses).   25 tablet   4   . omeprazole (PRILOSEC) 20 MG capsule   Oral   Take 20 mg by mouth daily.         . propafenone (RYTHMOL) 150 MG tablet   Oral   Take 150 mg by mouth 2 (two) times daily.         . Ticagrelor (BRILINTA) 90 MG TABS tablet   Oral   Take 1 tablet (90 mg total) by mouth 2 (two) times daily.   60 tablet   10   . AMBULATORY NON FORMULARY MEDICATION      CPAP Machine USES AS DIRECTED           BP 139/78  Pulse 61  Temp(Src) 97.7 F (36.5 C) (Oral)  Resp 11  SpO2 100%  Physical Exam  Nursing note and vitals reviewed. Constitutional: She appears well-developed and well-nourished.  HENT:  Head: Normocephalic and atraumatic.  Eyes: Conjunctivae are normal. Pupils are  equal, round, and reactive to light.  Neck: Neck supple. No tracheal deviation present. No thyromegaly present.  Cardiovascular: Normal rate and regular rhythm.   No murmur heard. Pulmonary/Chest: Effort normal and breath sounds normal.  Abdominal: Soft. Bowel sounds are normal. She exhibits no distension. There is no tenderness.  Musculoskeletal: Normal range of motion. She exhibits no edema and no tenderness.  Neurological: She is alert. Coordination normal.  Skin: Skin is warm and dry. No rash noted.  Psychiatric: She has a normal mood and affect.    ED Course  Procedures (including critical care time)  Labs Reviewed  PRO B NATRIURETIC PEPTIDE - Abnormal; Notable for the following:    Pro B Natriuretic peptide (BNP) 639.3 (*)    All other components within normal limits  COMPREHENSIVE METABOLIC PANEL - Abnormal; Notable for the following:    Glucose, Bld 101 (*)    GFR calc non Af Amer 71 (*)    GFR calc Af Amer 82 (*)    All other components within normal limits  POCT I-STAT TROPONIN I - Abnormal; Notable for the following:    Troponin i, poc 0.27 (*)    All other components within normal limits  CBC WITH DIFFERENTIAL   Dg Chest 2 View  08/16/2012  *RADIOLOGY REPORT*  Clinical Data: Left-sided chest pain.  Short of breath.  Coronary artery disease.  CHEST - 2 VIEW  Comparison: 08/14/2012  Findings: Heart size is within normal limits.  Both lungs are clear.  No evidence of pleural effusion.  Mild tortuosity of thoracic aorta is stable.  No mass or lymphadenopathy identified.  IMPRESSION: Stable exam.  No active disease.   Original Report Authenticated By: Myles Rosenthal, M.D.      No diagnosis found.   Date: 08/16/2012  Rate: 55  Rhythm: sinus bradycardia  QRS Axis: normal  Intervals: normal  ST/T Wave abnormalities: normal  Conduction Disutrbances:none  Narrative Interpretation:   Old EKG Reviewed: Normalization of lateral ischemia over tracing from 08/13/2012 interpreted  by me Results for orders placed during the hospital encounter of 08/16/12  CBC WITH DIFFERENTIAL      Result Value Range   WBC 7.3  4.0 - 10.5 K/uL   RBC  4.46  3.87 - 5.11 MIL/uL   Hemoglobin 13.8  12.0 - 15.0 g/dL   HCT 40.9  81.1 - 91.4 %   MCV 89.5  78.0 - 100.0 fL   MCH 30.9  26.0 - 34.0 pg   MCHC 34.6  30.0 - 36.0 g/dL   RDW 78.2  95.6 - 21.3 %   Platelets 167  150 - 400 K/uL   Neutrophils Relative 67  43 - 77 %   Neutro Abs 4.9  1.7 - 7.7 K/uL   Lymphocytes Relative 24  12 - 46 %   Lymphs Abs 1.8  0.7 - 4.0 K/uL   Monocytes Relative 8  3 - 12 %   Monocytes Absolute 0.6  0.1 - 1.0 K/uL   Eosinophils Relative 1  0 - 5 %   Eosinophils Absolute 0.1  0.0 - 0.7 K/uL   Basophils Relative 0  0 - 1 %   Basophils Absolute 0.0  0.0 - 0.1 K/uL  PRO B NATRIURETIC PEPTIDE      Result Value Range   Pro B Natriuretic peptide (BNP) 639.3 (*) 0 - 125 pg/mL  COMPREHENSIVE METABOLIC PANEL      Result Value Range   Sodium 138  135 - 145 mEq/L   Potassium 3.6  3.5 - 5.1 mEq/L   Chloride 104  96 - 112 mEq/L   CO2 24  19 - 32 mEq/L   Glucose, Bld 101 (*) 70 - 99 mg/dL   BUN 14  6 - 23 mg/dL   Creatinine, Ser 0.86  0.50 - 1.10 mg/dL   Calcium 8.4  8.4 - 57.8 mg/dL   Total Protein 6.7  6.0 - 8.3 g/dL   Albumin 3.8  3.5 - 5.2 g/dL   AST 22  0 - 37 U/L   ALT 21  0 - 35 U/L   Alkaline Phosphatase 62  39 - 117 U/L   Total Bilirubin 0.5  0.3 - 1.2 mg/dL   GFR calc non Af Amer 71 (*) >90 mL/min   GFR calc Af Amer 82 (*) >90 mL/min  POCT I-STAT TROPONIN I      Result Value Range   Troponin i, poc 0.27 (*) 0.00 - 0.08 ng/mL   Comment NOTIFIED PHYSICIAN     Comment 3           POCT I-STAT TROPONIN I      Result Value Range   Troponin i, poc 0.26 (*) 0.00 - 0.08 ng/mL   Comment NOTIFIED PHYSICIAN     Comment 3            Dg Chest 2 View  08/16/2012  *RADIOLOGY REPORT*  Clinical Data: Left-sided chest pain.  Short of breath.  Coronary artery disease.  CHEST - 2 VIEW  Comparison: 08/14/2012   Findings: Heart size is within normal limits.  Both lungs are clear.  No evidence of pleural effusion.  Mild tortuosity of thoracic aorta is stable.  No mass or lymphadenopathy identified.  IMPRESSION: Stable exam.  No active disease.   Original Report Authenticated By: Myles Rosenthal, M.D.    Dg Chest 2 View  08/10/2012  *RADIOLOGY REPORT*  Clinical Data: Chest pain, short of breath  CHEST - 2 VIEW  Comparison: Prior chest x-ray 11/22/2010  Findings: The lungs are well-aerated and free from pulmonary edema, focal airspace consolidation or pulmonary nodule.  Chronic left basilar atelectasis or scarring is similar to prior exams. Cardiac and mediastinal contours are within normal limits.  No pneumothorax, or pleural effusion. No acute osseous findings.  IMPRESSION:  No acute cardiopulmonary disease.   Original Report Authenticated By: Malachy Moan, M.D.    Dg Chest Port 1 View  08/14/2012  *RADIOLOGY REPORT*  Clinical Data: Chest pain, shortness of breath tonight  PORTABLE CHEST ONE-VIEW:  Comparison: Portable exam 0105 hours without priors for comparison  Findings:   Normal heart size and pulmonary vascularity.  Tortuous aorta.  Lungs clear.  No pleural effusion or pneumothorax.  No acute osseous findings.  IMPRESSION:  No acute abnormalities. *RADIOLOGY REPORT*  Clinical Data: Chest pain, shortness of breath tonight  PORTABLE CHEST ONE-VIEW:  Comparison: Portable exam 0105 hours without priors for comparison  Findings: Normal heart size and pulmonary vascularity. Tortuous aorta. Lungs clear. No pleural effusion or pneumothorax. No acute osseous findings.  IMPRESSION: No acute abnormalities.   Original Report Authenticated By: Ulyses Southward, M.D.    Ct Angio Chest Aortic Dissect W &/or W/o  08/14/2012  *RADIOLOGY REPORT*  Clinical Data:  Chest pain  CT ANGIOGRAPHY CHEST, ABDOMEN AND PELVIS  Technique:  Multidetector CT imaging through the chest, abdomen and pelvis was performed using the standard protocol  during bolus administration of intravenous contrast.  Multiplanar reconstructed images including MIPs were obtained and reviewed to evaluate the vascular anatomy.  Contrast: OMNIPAQUE IOHEXOL 350 MG/ML SOLN  Comparison:  11/11/2008 abdominal MRI  CTA CHEST  Findings:  Normal caliber aorta and branch vessels.  No aneurysmal dilatation or dissection.  Main pulmonary arterial branches are patent.  Heart size upper normal to mildly enlarged.  No intrathoracic lymphadenopathy.  No pleural or pericardial effusion.  Central airways are patent.  Mild bibasilar atelectasis or scarring.  Biapical scarring. 8 mm subpleural nodular density right upper lobe posteriorly on series 6 image 19.  No confluent airspace opacity.  No pneumothorax.   Review of the MIP images confirms the above findings.  IMPRESSION: No aortic dissection or acute intrathoracic process.  Heart size upper normal to mildly enlarged.  8 mm subpleural nodular density within the right upper lobe. If the patient is at high risk for bronchogenic carcinoma, follow-up chest CT at 3-6 months is recommended.  If the patient is at low risk for bronchogenic carcinoma, follow-up chest CT at 6-12 months is recommended.  This recommendation follows the consensus statement: Guidelines for Management of Small Pulmonary Nodules Detected on CT Scans: A Statement from the Fleischner Society as published in Radiology 2005; 237:395-400.  CTA ABDOMEN AND PELVIS  Findings:  Intra-abdominal organ evaluation is limited in the arterial phase.  Question hepatic steatosis.  Unremarkable biliary system, spleen, pancreas, adrenal glands.  There are a couple too small to further characterize renal hypodensities.  No hydronephrosis or hydroureter.  No CT evidence for colitis.  Colonic diverticulosis.  Normal appendix.  Small bowel loops are normal course and caliber.  Thin-walled bladder.  Absent uterus.  No adnexal mass.  Normal caliber abdominal aorta.  Patent celiac axis, SMA,  IMA, and iliac vessels.  Mild scattered atherosclerotic disease.  No acute osseous finding. Multi lumbar degenerative disc disease.  Review of the MIP images confirms the above findings.  IMPRESSION: Mild scattered atherosclerotic disease of the abdominal aorta and branch vessels.  No aneurysmal dilatation or dissection.  No acute abdominopelvic process identified by CT.   Original Report Authenticated By: Jearld Lesch, M.D.    Ct Angio Abd/pel W/ And/or W/o  08/14/2012  *RADIOLOGY REPORT*  Clinical Data:  Chest pain  CT ANGIOGRAPHY CHEST,  ABDOMEN AND PELVIS  Technique:  Multidetector CT imaging through the chest, abdomen and pelvis was performed using the standard protocol during bolus administration of intravenous contrast.  Multiplanar reconstructed images including MIPs were obtained and reviewed to evaluate the vascular anatomy.  Contrast: OMNIPAQUE IOHEXOL 350 MG/ML SOLN  Comparison:  11/11/2008 abdominal MRI  CTA CHEST  Findings:  Normal caliber aorta and branch vessels.  No aneurysmal dilatation or dissection.  Main pulmonary arterial branches are patent.  Heart size upper normal to mildly enlarged.  No intrathoracic lymphadenopathy.  No pleural or pericardial effusion.  Central airways are patent.  Mild bibasilar atelectasis or scarring.  Biapical scarring. 8 mm subpleural nodular density right upper lobe posteriorly on series 6 image 19.  No confluent airspace opacity.  No pneumothorax.   Review of the MIP images confirms the above findings.  IMPRESSION: No aortic dissection or acute intrathoracic process.  Heart size upper normal to mildly enlarged.  8 mm subpleural nodular density within the right upper lobe. If the patient is at high risk for bronchogenic carcinoma, follow-up chest CT at 3-6 months is recommended.  If the patient is at low risk for bronchogenic carcinoma, follow-up chest CT at 6-12 months is recommended.  This recommendation follows the consensus statement: Guidelines for  Management of Small Pulmonary Nodules Detected on CT Scans: A Statement from the Fleischner Society as published in Radiology 2005; 237:395-400.  CTA ABDOMEN AND PELVIS  Findings:  Intra-abdominal organ evaluation is limited in the arterial phase.  Question hepatic steatosis.  Unremarkable biliary system, spleen, pancreas, adrenal glands.  There are a couple too small to further characterize renal hypodensities.  No hydronephrosis or hydroureter.  No CT evidence for colitis.  Colonic diverticulosis.  Normal appendix.  Small bowel loops are normal course and caliber.  Thin-walled bladder.  Absent uterus.  No adnexal mass.  Normal caliber abdominal aorta.  Patent celiac axis, SMA, IMA, and iliac vessels.  Mild scattered atherosclerotic disease.  No acute osseous finding. Multi lumbar degenerative disc disease.  Review of the MIP images confirms the above findings.  IMPRESSION: Mild scattered atherosclerotic disease of the abdominal aorta and branch vessels.  No aneurysmal dilatation or dissection.  No acute abdominopelvic process identified by CT.   Original Report Authenticated By: Jearld Lesch, M.D.     Chest xray viewed by me 11:30 PM pain improved after treatment with intravenous nitroglycerin  MDM  Dr. Terressa Koyanagi was counseled by me the symptoms are suspicious for unstable angina, in light of recent coronary stent Dr. Terressa Koyanagi arrange for inpatient stay Diagnosis unstable angina CRITICAL CARE Performed by: Doug Sou   Total critical care time: 30 minute  Critical care time was exclusive of separately billable procedures and treating other patients.  Critical care was necessary to treat or prevent imminent or life-threatening deterioration.  Critical care was time spent personally by me on the following activities: development of treatment plan with patient and/or surrogate as well as nursing, discussions with consultants, evaluation of patient's response to treatment, examination of  patient, obtaining history from patient or surrogate, ordering and performing treatments and interventions, ordering and review of laboratory studies, ordering and review of radiographic studies, pulse oximetry and re-evaluation of patient's condition.       Doug Sou, MD 08/17/12 [phone]

## 2012-08-17 ENCOUNTER — Encounter (HOSPITAL_COMMUNITY): Payer: Self-pay | Admitting: *Deleted

## 2012-08-17 DIAGNOSIS — I4891 Unspecified atrial fibrillation: Secondary | ICD-10-CM

## 2012-08-17 DIAGNOSIS — I2510 Atherosclerotic heart disease of native coronary artery without angina pectoris: Secondary | ICD-10-CM

## 2012-08-17 DIAGNOSIS — I519 Heart disease, unspecified: Secondary | ICD-10-CM

## 2012-08-17 DIAGNOSIS — I200 Unstable angina: Secondary | ICD-10-CM

## 2012-08-17 LAB — CBC
HCT: 39.2 % (ref 36.0–46.0)
HCT: 40.6 % (ref 36.0–46.0)
Hemoglobin: 13.7 g/dL (ref 12.0–15.0)
MCV: 89.1 fL (ref 78.0–100.0)
MCV: 88.3 fL (ref 78.0–100.0)
Platelets: 174 10*3/uL (ref 150–400)
RBC: 4.4 MIL/uL (ref 3.87–5.11)
RBC: 4.6 MIL/uL (ref 3.87–5.11)
WBC: 6.9 10*3/uL (ref 4.0–10.5)
WBC: 6.6 10*3/uL (ref 4.0–10.5)

## 2012-08-17 LAB — BASIC METABOLIC PANEL
CO2: 23 mEq/L (ref 19–32)
Calcium: 8.6 mg/dL (ref 8.4–10.5)
Chloride: 102 mEq/L (ref 96–112)
Potassium: 3.6 mEq/L (ref 3.5–5.1)
Sodium: 138 mEq/L (ref 135–145)

## 2012-08-17 LAB — CREATININE, SERUM: GFR calc Af Amer: >90 mL/min (ref >90)

## 2012-08-17 MED ORDER — VITAMIN D3 25 MCG (1000 UNIT) PO TABS
1000.0000 [IU] | ORAL_TABLET | Freq: Every day | ORAL | Status: DC
  Administered 2012-08-17 – 2012-08-20 (×4): 1000 [IU] via ORAL
  Filled 2012-08-17 (×4): qty 1

## 2012-08-17 MED ORDER — HEPARIN (PORCINE) IN NACL 100-0.45 UNIT/ML-% IJ SOLN
700.0000 [IU]/h | INTRAMUSCULAR | Status: DC
  Administered 2012-08-17 – 2012-08-18 (×3): 700 [IU]/h via INTRAVENOUS
  Filled 2012-08-17 (×3): qty 250

## 2012-08-17 MED ORDER — AMLODIPINE BESYLATE 5 MG PO TABS
5.0000 mg | ORAL_TABLET | Freq: Every day | ORAL | Status: DC
  Administered 2012-08-17 – 2012-08-19 (×4): 5 mg via ORAL
  Filled 2012-08-17 (×5): qty 1

## 2012-08-17 MED ORDER — PROPAFENONE HCL 150 MG PO TABS
150.0000 mg | ORAL_TABLET | Freq: Two times a day (BID) | ORAL | Status: DC
  Administered 2012-08-17 – 2012-08-20 (×7): 150 mg via ORAL
  Filled 2012-08-17 (×8): qty 1

## 2012-08-17 MED ORDER — FUROSEMIDE 10 MG/ML IJ SOLN
20.0000 mg | Freq: Once | INTRAMUSCULAR | Status: AC
  Administered 2012-08-17: 20 mg via INTRAVENOUS

## 2012-08-17 MED ORDER — OXYCODONE-ACETAMINOPHEN 5-325 MG PO TABS
1.0000 | ORAL_TABLET | Freq: Four times a day (QID) | ORAL | Status: DC | PRN
  Administered 2012-08-17: 1 via ORAL
  Filled 2012-08-17: qty 1

## 2012-08-17 MED ORDER — FUROSEMIDE 10 MG/ML IJ SOLN
INTRAMUSCULAR | Status: AC
  Filled 2012-08-17: qty 4

## 2012-08-17 MED ORDER — SODIUM CHLORIDE 0.9 % IV SOLN
250.0000 mL | INTRAVENOUS | Status: DC | PRN
  Administered 2012-08-17: 03:00:00 via INTRAVENOUS
  Administered 2012-08-18: 250 mL via INTRAVENOUS

## 2012-08-17 MED ORDER — ONDANSETRON HCL 4 MG/2ML IJ SOLN: 4.0000 mg | Freq: Four times a day (QID) | INTRAMUSCULAR | Status: DC | PRN

## 2012-08-17 MED ORDER — NITROGLYCERIN 0.4 MG SL SUBL: 0.4000 mg | SUBLINGUAL_TABLET | SUBLINGUAL | Status: DC | PRN

## 2012-08-17 MED ORDER — HEPARIN BOLUS VIA INFUSION
2000.0000 [IU] | Freq: Once | INTRAVENOUS | Status: AC
  Administered 2012-08-17: 2000 [IU] via INTRAVENOUS
  Filled 2012-08-17: qty 2000

## 2012-08-17 MED ORDER — ASPIRIN EC 81 MG PO TBEC
81.0000 mg | DELAYED_RELEASE_TABLET | Freq: Every day | ORAL | Status: DC
  Administered 2012-08-17 – 2012-08-20 (×4): 81 mg via ORAL
  Filled 2012-08-17 (×5): qty 1

## 2012-08-17 MED ORDER — ATENOLOL 25 MG PO TABS
25.0000 mg | ORAL_TABLET | Freq: Every day | ORAL | Status: DC
  Administered 2012-08-17: 25 mg via ORAL
  Filled 2012-08-17 (×3): qty 1

## 2012-08-17 MED ORDER — SODIUM CHLORIDE 0.9 % IJ SOLN: 3.0000 mL | INTRAMUSCULAR | Status: DC | PRN

## 2012-08-17 MED ORDER — PANTOPRAZOLE SODIUM 40 MG PO TBEC
40.0000 mg | DELAYED_RELEASE_TABLET | Freq: Every day | ORAL | Status: DC
  Administered 2012-08-17 – 2012-08-19 (×3): 40 mg via ORAL
  Filled 2012-08-17 (×3): qty 1

## 2012-08-17 MED ORDER — ACETAMINOPHEN 325 MG PO TABS
650.0000 mg | ORAL_TABLET | ORAL | Status: DC | PRN
  Administered 2012-08-17 (×2): 650 mg via ORAL
  Filled 2012-08-17 (×2): qty 2

## 2012-08-17 MED ORDER — ENOXAPARIN SODIUM 40 MG/0.4ML ~~LOC~~ SOLN
40.0000 mg | SUBCUTANEOUS | Status: DC
  Administered 2012-08-17: 40 mg via SUBCUTANEOUS
  Filled 2012-08-17: qty 0.4

## 2012-08-17 MED ORDER — TICAGRELOR 90 MG PO TABS
90.0000 mg | ORAL_TABLET | Freq: Two times a day (BID) | ORAL | Status: DC
  Administered 2012-08-17 – 2012-08-20 (×7): 90 mg via ORAL
  Filled 2012-08-17 (×8): qty 1

## 2012-08-17 MED ORDER — SODIUM CHLORIDE 0.9 % IJ SOLN
3.0000 mL | Freq: Two times a day (BID) | INTRAMUSCULAR | Status: DC
  Administered 2012-08-17: 11:00:00 via INTRAVENOUS
  Administered 2012-08-18: 3 mL via INTRAVENOUS

## 2012-08-17 NOTE — Progress Notes (Signed)
Pt. Refuses to wear CPAP at this time. Pt. States that she wears nasal pillows at home & that she wouldn't be comfortable wearing a nasal mask. Pt. Was made aware that she could have someone to bring her mask from home. Pt. Was made aware to call if she changed her mind anytime during the night & decided to wear CPAP. RN is aware of this.

## 2012-08-17 NOTE — Progress Notes (Signed)
ANTICOAGULATION CONSULT NOTE - Initial Consult  Pharmacy Consult for heparin Indication: chest pain/ACS  Allergies  Allergen Reactions  . Statins Other (See Comments)    Restless legs, bad feeling    Patient Measurements: Height: 5' (152.4 cm) Weight: 137 lb 2 oz (62.2 kg) IBW/kg (Calculated) : 45.5 Heparin Dosing Weight: 58.5kg  Vital Signs: Temp: 97.2 F (36.2 C) (03/15 1205) Temp src: Oral (03/15 1205) BP: 93/62 mmHg (03/15 1205) Pulse Rate: 61 (03/15 1205)  Labs:  Recent Labs  08/14/12 1536  08/16/12 2045 08/17/12 0123 08/17/12 0700  HGB  --   < > 13.8 13.7 14.5  HCT  --   < > 39.9 39.2 40.6  PLT  --   < > 167 170 174  CREATININE  --   < > 0.84 0.71 0.75  TROPONINI 0.69*  --   --  0.53* 0.44*  < > = values in this interval not displayed.  Estimated Creatinine Clearance: 57 ml/min (by C-G formula based on Cr of 0.75).   Assessment: 47 YOF who was discharged home 3/13 and is readmitted 3/14 with some chest pain and SOB. Troponins are slightly elevated. Plan to go to cath on Monday 3/17. Did receive dose of prophylactic Lovenox this morning. CBC is normal and stable. No bleeding noted.  Goal of Therapy:  Heparin level 0.3-0.7 units/ml Monitor platelets by anticoagulation protocol: Yes   Plan:  1. Give a smaller bolus of 2000units IV x1 2. Start heparin drip at 700 units/hr 3. Heparin level at 2200 4. Daily heparin level and CBC  Rosangela Fehrenbach D. Vicki Pasqual, PharmD Clinical Pharmacist Pager: [phone] 08/17/2012 2:47 PM

## 2012-08-17 NOTE — ED Notes (Signed)
Pt currently stating that she is chest pain free

## 2012-08-17 NOTE — Progress Notes (Signed)
ANTICOAGULATION CONSULT NOTE - Follow Up Consult  Pharmacy Consult for heparin Indication: chest pain/ACS  Labs:  Recent Labs  08/16/12 2045 08/17/12 0123 08/17/12 0700 08/17/12 2155  HGB 13.8 13.7 14.5  --   HCT 39.9 39.2 40.6  --   PLT 167 170 174  --   HEPARINUNFRC  --   --   --  0.55  CREATININE 0.84 0.71 0.75  --   TROPONINI  --  0.53* 0.44*  --     Assessment/Plan:  67yo female therapeutic on heparin with initial dosing for ACS.  Will continue gtt at current rate and confirm stable with am labs.  Vernard Gambles, PharmD, BCPS  08/17/2012,10:34 PM

## 2012-08-17 NOTE — Progress Notes (Addendum)
PROGRESS NOTE  Subjective:   Shannon Cummings is a 67 yo with hx of CAD - s/p recent stent to LAD.   She was D/Cd home on 3/13 but  Presented with recurrent CP 3/14.  Troponins are minimally elevated.  She is having dyspnea.   Objective:    Vital Signs:   Temp:  [97.7 F (36.5 C)-98.3 F (36.8 C)] 98.3 F (36.8 C) (03/15 0747) Pulse Rate:  [55-66] 56 (03/15 0800) Resp:  [9-21] 13 (03/15 0800) BP: (92-142)/(65-80) 109/79 mmHg (03/15 0800) SpO2:  [95 %-100 %] 96 % (03/15 0800) Weight:  [137 lb 2 oz (62.2 kg)] 137 lb 2 oz (62.2 kg) (03/15 0239)  Last BM Date: 08/16/12   24-hour weight change: Weight change:   Weight trends: Filed Weights   08/17/12 0118 08/17/12 0239  Weight: 137 lb 2 oz (62.2 kg) 137 lb 2 oz (62.2 kg)    Intake/Output:  03/14 0701 - 03/15 0700 In: 108 [I.V.:108] Out: 1400 [Urine:1400] Total I/O In: 17.5 [I.V.:17.5] Out: -    Physical Exam: BP 109/79  Pulse 56  Temp(Src) 98.3 F (36.8 C) (Oral)  Resp 13  Ht 5' (1.524 m)  Wt 137 lb 2 oz (62.2 kg)  BMI 26.78 kg/m2  SpO2 96%  General: Vital signs reviewed and noted.   Head: Normocephalic, atraumatic.  Eyes: conjunctivae/corneas clear.  EOM's intact.   Throat: normal  Neck:  normal  Lungs:    clear  Heart:  RR, normal S1, S2  Abdomen:  Soft, non-tender, non-distended    Extremities:  no edema  Neurologic: A&O X3, CN II - XII are grossly intact.   Psych: Normal     Labs: BMET:  Recent Labs  08/16/12 2045 08/17/12 0123 08/17/12 0700  NA 138  --  138  K 3.6  --  3.6  CL 104  --  102  CO2 24  --  23  GLUCOSE 101*  --  108*  BUN 14  --  13  CREATININE 0.84 0.71 0.75  CALCIUM 8.4  --  8.6  MG  --   --  2.0    Liver function tests:  Recent Labs  08/16/12 2045  AST 22  ALT 21  ALKPHOS 62  BILITOT 0.5  PROT 6.7  ALBUMIN 3.8   No results found for this basename: LIPASE, AMYLASE,  in the last 72 hours  CBC:  Recent Labs  08/16/12 2045 08/17/12 0123 08/17/12 0700  WBC 7.3  6.9 6.6  NEUTROABS 4.9  --   --   HGB 13.8 13.7 14.5  HCT 39.9 39.2 40.6  MCV 89.5 89.1 88.3  PLT 167 170 174    Cardiac Enzymes:  Recent Labs  08/14/12 1536 08/17/12 0123  TROPONINI 0.69* 0.53*    Coagulation Studies: No results found for this basename: LABPROT, INR,  in the last 72 hours  Other: No components found with this basename: POCBNP,  No results found for this basename: DDIMER,  in the last 72 hours No results found for this basename: HGBA1C,  in the last 72 hours No results found for this basename: CHOL, HDL, LDLCALC, TRIG, CHOLHDL,  in the last 72 hours No results found for this basename: TSH, T4TOTAL, FREET3, T3FREE, THYROIDAB,  in the last 72 hours No results found for this basename: VITAMINB12, FOLATE, FERRITIN, TIBC, IRON, RETICCTPCT,  in the last 72 hours   Other results:  EKG :  NSR, No ST or T changes  Medications:  Infusions:    Scheduled Medications: . amLODipine  5 mg Oral QHS  . aspirin EC  81 mg Oral Daily  . atenolol  25 mg Oral QHS  . cholecalciferol  1,000 Units Oral Daily  . enoxaparin (LOVENOX) injection  40 mg Subcutaneous Q24H  . pantoprazole  40 mg Oral Daily  . propafenone  150 mg Oral BID  . sodium chloride  3 mL Intravenous Q12H  . Ticagrelor  90 mg Oral BID    Assessment/ Plan:    1. CAD:  Shannon Cummings returned to the hospital the day after getting discharged after PCI.  Troponin levels are slightly higher.  It's difficult to tell if this mild Troponin bump is from the PCI procedure 3 days ago vs. Another  ACS ( ? Edge dissection, etc. )  Will allow her to eat this am.  Anticipate re-look cath Monday.   2.  Hyperlipidemia:   Disposition: keep in step down today.  Anticipate transfer to tele Sunday  3. Atrial fib:  She is still in NSR - cont Rythmol.   Length of Stay: 1  Vesta Mixer, Montez Hageman., MD, [facility] 08/17/2012, 9:44 AM Office [phone] Pager [phone]

## 2012-08-17 NOTE — Progress Notes (Signed)
  Echocardiogram 2D Echocardiogram has been performed.  Georgian Co 08/17/2012, 9:51 AM

## 2012-08-18 LAB — HEPARIN LEVEL (UNFRACTIONATED): Heparin Unfractionated: 0.47 IU/mL (ref 0.30–0.70)

## 2012-08-18 LAB — CBC
Hemoglobin: 13.8 g/dL (ref 12.0–15.0)
Platelets: 160 10*3/uL (ref 150–400)
RBC: 4.47 MIL/uL (ref 3.87–5.11)
WBC: 6 10*3/uL (ref 4.0–10.5)

## 2012-08-18 MED ORDER — MORPHINE SULFATE 2 MG/ML IJ SOLN: 1.0000 mg | INTRAMUSCULAR | Status: DC | PRN

## 2012-08-18 MED ORDER — SODIUM CHLORIDE 0.9 % IJ SOLN: 3.0000 mL | Freq: Two times a day (BID) | INTRAMUSCULAR | Status: DC

## 2012-08-18 MED ORDER — ATENOLOL 25 MG PO TABS
25.0000 mg | ORAL_TABLET | Freq: Every day | ORAL | Status: DC
  Administered 2012-08-18 – 2012-08-19 (×2): 25 mg via ORAL
  Filled 2012-08-18 (×3): qty 1

## 2012-08-18 MED ORDER — SODIUM CHLORIDE 0.9 % IV SOLN: 250.0000 mL | INTRAVENOUS | Status: DC | PRN

## 2012-08-18 MED ORDER — SODIUM CHLORIDE 0.9 % IJ SOLN: 3.0000 mL | INTRAMUSCULAR | Status: DC | PRN

## 2012-08-18 NOTE — Progress Notes (Signed)
ANTICOAGULATION CONSULT NOTE - Initial Consult  Pharmacy Consult for heparin Indication: chest pain/ACS  Allergies  Allergen Reactions  . Statins Other (See Comments)    Restless legs, bad feeling    Patient Measurements: Height: 5' (152.4 cm) Weight: 137 lb 2 oz (62.2 kg) IBW/kg (Calculated) : 45.5 Heparin Dosing Weight: 58.5kg  Vital Signs: Temp: 98.2 F (36.8 C) (03/16 0751) Temp src: Oral (03/16 0751) BP: 98/57 mmHg (03/16 0752) Pulse Rate: 51 (03/16 0800)  Labs:  Recent Labs  08/16/12 2045 08/17/12 0123 08/17/12 0700 08/17/12 2155 08/18/12 0458  HGB 13.8 13.7 14.5  --  13.8  HCT 39.9 39.2 40.6  --  40.1  PLT 167 170 174  --  160  HEPARINUNFRC  --   --   --  0.55 0.47  CREATININE 0.84 0.71 0.75  --   --   TROPONINI  --  0.53* 0.44*  --   --     Estimated Creatinine Clearance: 57 ml/min (by C-G formula based on Cr of 0.75).   Assessment: 75 YOF who was discharged home 3/13 and is readmitted 3/14 with some chest pain and SOB. Troponins are slightly elevated. Plan to go to cath on Monday 3/17. HL has been therapeutic x2 with a level of 0.47 this morning. CBC is stable and no bleeding noted.  Goal of Therapy:  Heparin level 0.3-0.7 units/ml Monitor platelets by anticoagulation protocol: Yes   Plan:  1. Continue heparin gtt at 700 units/hr 2. Daily HL and CBC 3. Follow up results of cath  Donell Tomkins D. Marysol Wellnitz, PharmD Clinical Pharmacist Pager: [phone] 08/18/2012 10:58 AM

## 2012-08-18 NOTE — Progress Notes (Signed)
Patient placed herself on her home CPAP unit for the night

## 2012-08-18 NOTE — Progress Notes (Signed)
Report to [facility]; pt transferred via wheelchair c telemetry and RN

## 2012-08-18 NOTE — Progress Notes (Signed)
PROGRESS NOTE  Subjective:   Shannon Cummings is a 67 yo with hx of CAD - s/p recent stent to LAD.   She was D/Cd home on 3/13 but  Presented with recurrent CP 3/14.  Troponins are minimally elevated but are trending downward.     Echo 3/15 shows ant. Apical severe hypokinesis.     Objective:    Vital Signs:   Temp:  [97.2 F (36.2 C)-98.3 F (36.8 C)] 98.2 F (36.8 C) (03/16 0751) Pulse Rate:  [50-67] 50 (03/16 0400) Resp:  [13-16] 14 (03/16 0400) BP: (81-111)/(51-71) 84/52 mmHg (03/16 0400) SpO2:  [94 %-98 %] 94 % (03/16 0400)  Last BM Date: 08/16/12   24-hour weight change: Weight change:   Weight trends: Filed Weights   08/17/12 0118 08/17/12 0239  Weight: 137 lb 2 oz (62.2 kg) 137 lb 2 oz (62.2 kg)    Intake/Output:  03/15 0701 - 03/16 0700 In: 1179.4 [P.O.:797; I.V.:382.4] Out: 1100 [Urine:1100]     Physical Exam: BP 84/52  Pulse 50  Temp(Src) 98.2 F (36.8 C) (Oral)  Resp 14  Ht 5' (1.524 m)  Wt 137 lb 2 oz (62.2 kg)  BMI 26.78 kg/m2  SpO2 94%  General: Vital signs reviewed and noted.   Head: Normocephalic, atraumatic.  Eyes: conjunctivae/corneas clear.  EOM's intact.   Throat: normal  Neck:  normal  Lungs:   clear  Heart:  RR, normal S1, S2  Abdomen:  Soft, non-tender, non-distended    Extremities:  no edema  Neurologic: A&O X3, CN II - XII are grossly intact.   Psych: Normal     Labs: BMET:  Recent Labs  08/16/12 2045 08/17/12 0123 08/17/12 0700  NA 138  --  138  K 3.6  --  3.6  CL 104  --  102  CO2 24  --  23  GLUCOSE 101*  --  108*  BUN 14  --  13  CREATININE 0.84 0.71 0.75  CALCIUM 8.4  --  8.6  MG  --   --  2.0    Liver function tests:  Recent Labs  08/16/12 2045  AST 22  ALT 21  ALKPHOS 62  BILITOT 0.5  PROT 6.7  ALBUMIN 3.8   No results found for this basename: LIPASE, AMYLASE,  in the last 72 hours  CBC:  Recent Labs  08/16/12 2045  08/17/12 0700 08/18/12 0458  WBC 7.3  < > 6.6 6.0  NEUTROABS 4.9  --    --   --   HGB 13.8  < > 14.5 13.8  HCT 39.9  < > 40.6 40.1  MCV 89.5  < > 88.3 89.7  PLT 167  < > 174 160  < > = values in this interval not displayed.  Cardiac Enzymes:  Recent Labs  08/17/12 0123 08/17/12 0700  TROPONINI 0.53* 0.44*    Coagulation Studies: No results found for this basename: LABPROT, INR,  in the last 72 hours  Other: No components found with this basename: POCBNP,  No results found for this basename: DDIMER,  in the last 72 hours No results found for this basename: HGBA1C,  in the last 72 hours No results found for this basename: CHOL, HDL, LDLCALC, TRIG, CHOLHDL,  in the last 72 hours No results found for this basename: TSH, T4TOTAL, FREET3, T3FREE, THYROIDAB,  in the last 72 hours No results found for this basename: VITAMINB12, FOLATE, FERRITIN, TIBC, IRON, RETICCTPCT,  in the last 72 hours  Other results:  EKG :  NSR, No ST or T changes  Medications:    Infusions: . heparin 700 Units/hr (08/17/12 1602)    Scheduled Medications: . amLODipine  5 mg Oral QHS  . aspirin EC  81 mg Oral Daily  . atenolol  25 mg Oral QHS  . cholecalciferol  1,000 Units Oral Daily  . pantoprazole  40 mg Oral Daily  . propafenone  150 mg Oral BID  . sodium chloride  3 mL Intravenous Q12H  . Ticagrelor  90 mg Oral BID    Assessment/ Plan:    1. CAD:  Annalia returned to the hospital the day after getting discharged after PCI.  Troponin levels are slightly higher.  It's difficult to tell if this mild Troponin bump is from the PCI procedure 3 days ago vs. Another  ACS ( ? Edge dissection, etc. )    Anticipate re-look cath Monday.   Orders written.   2.  Hyperlipidemia:   Disposition: she is stable on heparin drip.  Transfer to tele today.   3. Atrial fib:  She is still in NSR - cont Rythmol.   Length of Stay: 2  Vesta Mixer, Montez Hageman., MD, [facility] 08/18/2012, 9:21 AM Office [phone] Pager [phone]

## 2012-08-18 NOTE — Progress Notes (Signed)
Pt c/o deep burning to left chest with associated left jaw and left arm tingling. Pt does not want nitro at this time. EKG unchanged. Notified Ward Givens NP. New orders given. Emelda Brothers RN

## 2012-08-19 ENCOUNTER — Encounter (HOSPITAL_COMMUNITY)
Admission: EM | Disposition: A | Discharge status: Home / Self Care | Payer: Self-pay | Source: Home / Self Care | Attending: Internal Medicine

## 2012-08-19 DIAGNOSIS — R079 Chest pain, unspecified: Secondary | ICD-10-CM

## 2012-08-19 HISTORY — PX: LEFT HEART CATHETERIZATION WITH CORONARY ANGIOGRAM: SHX5451

## 2012-08-19 LAB — CBC
HCT: 38.6 % (ref 36.0–46.0)
Hemoglobin: 13.3 g/dL (ref 12.0–15.0)
MCHC: 34.5 g/dL (ref 30.0–36.0)
RDW: 13.2 % (ref 11.5–15.5)
WBC: 5.5 10*3/uL (ref 4.0–10.5)

## 2012-08-19 LAB — HEPARIN LEVEL (UNFRACTIONATED): Heparin Unfractionated: 0.46 IU/mL (ref 0.30–0.70)

## 2012-08-19 SURGERY — LEFT HEART CATHETERIZATION WITH CORONARY ANGIOGRAM
Anesthesia: LOCAL

## 2012-08-19 MED ORDER — HEPARIN (PORCINE) IN NACL 2-0.9 UNIT/ML-% IJ SOLN
INTRAMUSCULAR | Status: AC
  Filled 2012-08-19: qty 1000

## 2012-08-19 MED ORDER — FENTANYL CITRATE 0.05 MG/ML IJ SOLN
INTRAMUSCULAR | Status: AC
  Filled 2012-08-19: qty 2

## 2012-08-19 MED ORDER — MIDAZOLAM HCL 2 MG/2ML IJ SOLN
INTRAMUSCULAR | Status: AC
  Filled 2012-08-19: qty 2

## 2012-08-19 MED ORDER — SODIUM CHLORIDE 0.9 % IJ SOLN: 3.0000 mL | INTRAMUSCULAR | Status: DC | PRN

## 2012-08-19 MED ORDER — SODIUM CHLORIDE 0.9 % IV SOLN: 1.0000 mL/kg/h | INTRAVENOUS | Status: AC

## 2012-08-19 MED ORDER — LIDOCAINE HCL (PF) 1 % IJ SOLN
INTRAMUSCULAR | Status: AC
  Filled 2012-08-19: qty 30

## 2012-08-19 MED ORDER — SODIUM CHLORIDE 0.9 % IJ SOLN: 3.0000 mL | Freq: Two times a day (BID) | INTRAMUSCULAR | Status: DC

## 2012-08-19 MED ORDER — SODIUM CHLORIDE 0.9 % IV SOLN: 250.0000 mL | INTRAVENOUS | Status: DC | PRN

## 2012-08-19 NOTE — Interval H&P Note (Signed)
History and Physical Interval Note:  08/19/2012 2:52 PM  Shannon Cummings  has presented today for surgery, with the diagnosis of relook  The various methods of treatment have been discussed with the patient and family. After consideration of risks, benefits and other options for treatment, the patient has consented to  Procedure(s): LEFT HEART CATHETERIZATION WITH CORONARY ANGIOGRAM (N/A) as a surgical intervention .  The patient's history has been reviewed, patient examined, no change in status, stable for surgery.  I have reviewed the patient's chart and labs.  Questions were answered to the patient's satisfaction.     Tonny Bollman

## 2012-08-19 NOTE — Progress Notes (Signed)
Pt has home CPAP and will place herself on tonight when ready for bed. RT will continue to monitor.

## 2012-08-19 NOTE — Progress Notes (Signed)
ANTICOAGULATION CONSULT NOTE - Follow Up Consult  Pharmacy Consult for heparin Indication: chest pain/ACS  Allergies  Allergen Reactions  . Statins Other (See Comments)    Restless legs, bad feeling    Patient Measurements: Height: 5' (152.4 cm) Weight: 137 lb 1.6 oz (62.188 kg) IBW/kg (Calculated) : 45.5 Heparin Dosing Weight: 58 kg  Vital Signs: Temp: 97.9 F (36.6 C) (03/17 0500) BP: 104/61 mmHg (03/17 0500) Pulse Rate: 51 (03/17 0500)  Labs:  Recent Labs  08/16/12 2045 08/17/12 0123 08/17/12 0700 08/17/12 2155 08/18/12 0458 08/19/12 0500  HGB 13.8 13.7 14.5  --  13.8 13.3  HCT 39.9 39.2 40.6  --  40.1 38.6  PLT 167 170 174  --  160 154  HEPARINUNFRC  --   --   --  0.55 0.47 0.46  CREATININE 0.84 0.71 0.75  --   --   --   TROPONINI  --  0.53* 0.44*  --   --   --     Estimated Creatinine Clearance: 57 ml/min (by C-G formula based on Cr of 0.75).   Assessment: Patient is a 67 y.o F on heparin for ACS with plan for cardiac cath procedure today.  Heparin level is at goal.  Goal of Therapy:  Heparin level 0.3-0.7 units/ml Monitor platelets by anticoagulation protocol: Yes   Plan:  1) no change for heparin regimen 2) f/u after cath procedure  Babita Amaker P 08/19/2012,8:30 AM

## 2012-08-19 NOTE — CV Procedure (Signed)
   Cardiac Catheterization Procedure Note  Name: Caysie Minnifield MRN: [id number] DOB: 1945-08-05  Procedure: Catheter placement for coronary angiography, Selective Coronary Angiography  Indication: Recurrent CP post-MI, recent prox LAD stenting   Procedural details: The right groin was prepped, draped, and anesthetized with 1% lidocaine. Using modified Seldinger technique, a 5 French sheath was introduced into the right femoral artery. Standard Judkins catheters were used for coronary angiography. Catheter exchanges were performed over a guidewire. There were no immediate procedural complications. The patient was transferred to the post catheterization recovery area for further monitoring.  Procedural Findings: Hemodynamics:  AO 105/56   Coronary angiography: Coronary dominance: right  Left mainstem: Widely patent  Left anterior descending (LAD): Prox LAD stent is widely patent without restenosis. Mid/distal LAD is patent without obstructive disease.  Left circumflex (LCx): Large vessel, widely patent throughout. No significant change from previous study.  Right coronary artery (RCA): Moderate caliber, dominant vessel. No obstructive disease noted.  Left ventriculography: deferred  Final Conclusions:   1. Continued patency of the proximal LAD stent 2. Patent LCx and RCA  Recommendations: Continue current management, reassurance.  Tonny Bollman 08/19/2012, 3:15 PM

## 2012-08-19 NOTE — H&P (View-Only) (Signed)
PROGRESS NOTE  Subjective:   Shannon Cummings is a 67 yo with hx of CAD - s/p recent stent to LAD.   She was D/Cd home on 3/13 but  Presented with recurrent CP 3/14.  Troponins are minimally elevated but are trending downward.     Echo 3/15 shows ant. Apical severe hypokinesis.   Scheduled for cath today.   Objective:    Vital Signs:   Temp:  [97.9 F (36.6 C)-98.3 F (36.8 C)] 97.9 F (36.6 C) (03/17 0500) Pulse Rate:  [51-57] 51 (03/17 0500) Resp:  [13-16] 16 (03/17 0500) BP: (96-122)/(57-82) 104/61 mmHg (03/17 0500) SpO2:  [95 %-98 %] 95 % (03/17 0500) Weight:  [137 lb 1.6 oz (62.188 kg)] 137 lb 1.6 oz (62.188 kg) (03/17 0500)  Last BM Date: 08/18/12   24-hour weight change: Weight change:   Weight trends: Filed Weights   08/17/12 0118 08/17/12 0239 08/19/12 0500  Weight: 137 lb 2 oz (62.2 kg) 137 lb 2 oz (62.2 kg) 137 lb 1.6 oz (62.188 kg)    Intake/Output:  03/16 0701 - 03/17 0700 In: 619 [P.O.:500; I.V.:119] Out: -      Physical Exam: BP 104/61  Pulse 51  Temp(Src) 97.9 F (36.6 C) (Oral)  Resp 16  Ht 5' (1.524 m)  Wt 137 lb 1.6 oz (62.188 kg)  BMI 26.78 kg/m2  SpO2 95%  General: Vital signs reviewed and noted.   Head: Normocephalic, atraumatic.  Eyes: conjunctivae/corneas clear.  EOM's intact.   Throat: normal  Neck:  normal  Lungs:   clear  Heart:  RR, normal S1, S2  Abdomen:  Soft, non-tender, non-distended    Extremities:  no edema  Neurologic: A&O X3, CN II - XII are grossly intact.   Psych: Normal     Labs: BMET:  Recent Labs  08/16/12 2045 08/17/12 0123 08/17/12 0700  NA 138  --  138  K 3.6  --  3.6  CL 104  --  102  CO2 24  --  23  GLUCOSE 101*  --  108*  BUN 14  --  13  CREATININE 0.84 0.71 0.75  CALCIUM 8.4  --  8.6  MG  --   --  2.0    Liver function tests:  Recent Labs  08/16/12 2045  AST 22  ALT 21  ALKPHOS 62  BILITOT 0.5  PROT 6.7  ALBUMIN 3.8   No results found for this basename: LIPASE, AMYLASE,  in the  last 72 hours  CBC:  Recent Labs  08/16/12 2045  08/18/12 0458 08/19/12 0500  WBC 7.3  < > 6.0 5.5  NEUTROABS 4.9  --   --   --   HGB 13.8  < > 13.8 13.3  HCT 39.9  < > 40.1 38.6  MCV 89.5  < > 89.7 88.1  PLT 167  < > 160 154  < > = values in this interval not displayed.  Cardiac Enzymes:  Recent Labs  08/17/12 0123 08/17/12 0700  TROPONINI 0.53* 0.44*    Coagulation Studies: No results found for this basename: LABPROT, INR,  in the last 72 hours  Other: No components found with this basename: POCBNP,  No results found for this basename: DDIMER,  in the last 72 hours No results found for this basename: HGBA1C,  in the last 72 hours No results found for this basename: CHOL, HDL, LDLCALC, TRIG, CHOLHDL,  in the last 72 hours No results found for this basename: TSH, T4TOTAL, FREET3,  T3FREE, THYROIDAB,  in the last 72 hours No results found for this basename: VITAMINB12, FOLATE, FERRITIN, TIBC, IRON, RETICCTPCT,  in the last 72 hours   Other results:  EKG :  NSR, No ST or T changes  Medications:    Infusions: . heparin 700 Units/hr (08/18/12 2215)    Scheduled Medications: . amLODipine  5 mg Oral QHS  . aspirin EC  81 mg Oral Daily  . atenolol  25 mg Oral QHS  . cholecalciferol  1,000 Units Oral Daily  . pantoprazole  40 mg Oral Daily  . propafenone  150 mg Oral BID  . sodium chloride  3 mL Intravenous Q12H  . sodium chloride  3 mL Intravenous Q12H  . Ticagrelor  90 mg Oral BID    Assessment/ Plan:    1. CAD:  Carleena returned to the hospital the day after getting discharged after PCI.  Troponin levels are slightly higher.  It's difficult to tell if this mild Troponin bump is from the PCI procedure 3 days ago vs. Another  ACS ( ? Edge dissection, etc. )    Anticipate re-look today.   Orders written.   2.  Hyperlipidemia:   Disposition: she is stable on heparin drip.  Transfer to tele today.   3. Atrial fib:  She is still in NSR - cont  Rythmol.   Length of Stay: 3  Vesta Mixer, Montez Hageman., MD, [facility] 08/19/2012, 7:11 AM Office [phone] Pager [phone]

## 2012-08-19 NOTE — Progress Notes (Signed)
 PROGRESS NOTE  Subjective:   Shannon Cummings is a 67 yo with hx of CAD - s/p recent stent to LAD.   She was D/Cd home on 3/13 but  Presented with recurrent CP 3/14.  Troponins are minimally elevated but are trending downward.     Echo 3/15 shows ant. Apical severe hypokinesis.   Scheduled for cath today.   Objective:    Vital Signs:   Temp:  [97.9 F (36.6 C)-98.3 F (36.8 C)] 97.9 F (36.6 C) (03/17 0500) Pulse Rate:  [51-57] 51 (03/17 0500) Resp:  [13-16] 16 (03/17 0500) BP: (96-122)/(57-82) 104/61 mmHg (03/17 0500) SpO2:  [95 %-98 %] 95 % (03/17 0500) Weight:  [137 lb 1.6 oz (62.188 kg)] 137 lb 1.6 oz (62.188 kg) (03/17 0500)  Last BM Date: 08/18/12   24-hour weight change: Weight change:   Weight trends: Filed Weights   08/17/12 0118 08/17/12 0239 08/19/12 0500  Weight: 137 lb 2 oz (62.2 kg) 137 lb 2 oz (62.2 kg) 137 lb 1.6 oz (62.188 kg)    Intake/Output:  03/16 0701 - 03/17 0700 In: 619 [P.O.:500; I.V.:119] Out: -      Physical Exam: BP 104/61  Pulse 51  Temp(Src) 97.9 F (36.6 C) (Oral)  Resp 16  Ht 5' (1.524 m)  Wt 137 lb 1.6 oz (62.188 kg)  BMI 26.78 kg/m2  SpO2 95%  General: Vital signs reviewed and noted.   Head: Normocephalic, atraumatic.  Eyes: conjunctivae/corneas clear.  EOM's intact.   Throat: normal  Neck:  normal  Lungs:   clear  Heart:  RR, normal S1, S2  Abdomen:  Soft, non-tender, non-distended    Extremities:  no edema  Neurologic: A&O X3, CN II - XII are grossly intact.   Psych: Normal     Labs: BMET:  Recent Labs  08/16/12 2045 08/17/12 0123 08/17/12 0700  NA 138  --  138  K 3.6  --  3.6  CL 104  --  102  CO2 24  --  23  GLUCOSE 101*  --  108*  BUN 14  --  13  CREATININE 0.84 0.71 0.75  CALCIUM 8.4  --  8.6  MG  --   --  2.0    Liver function tests:  Recent Labs  08/16/12 2045  AST 22  ALT 21  ALKPHOS 62  BILITOT 0.5  PROT 6.7  ALBUMIN 3.8   No results found for this basename: LIPASE, AMYLASE,  in the  last 72 hours  CBC:  Recent Labs  08/16/12 2045  08/18/12 0458 08/19/12 0500  WBC 7.3  < > 6.0 5.5  NEUTROABS 4.9  --   --   --   HGB 13.8  < > 13.8 13.3  HCT 39.9  < > 40.1 38.6  MCV 89.5  < > 89.7 88.1  PLT 167  < > 160 154  < > = values in this interval not displayed.  Cardiac Enzymes:  Recent Labs  08/17/12 0123 08/17/12 0700  TROPONINI 0.53* 0.44*    Coagulation Studies: No results found for this basename: LABPROT, INR,  in the last 72 hours  Other: No components found with this basename: POCBNP,  No results found for this basename: DDIMER,  in the last 72 hours No results found for this basename: HGBA1C,  in the last 72 hours No results found for this basename: CHOL, HDL, LDLCALC, TRIG, CHOLHDL,  in the last 72 hours No results found for this basename: TSH, T4TOTAL, FREET3,   T3FREE, THYROIDAB,  in the last 72 hours No results found for this basename: VITAMINB12, FOLATE, FERRITIN, TIBC, IRON, RETICCTPCT,  in the last 72 hours   Other results:  EKG :  NSR, No ST or T changes  Medications:    Infusions: . heparin 700 Units/hr (08/18/12 2215)    Scheduled Medications: . amLODipine  5 mg Oral QHS  . aspirin EC  81 mg Oral Daily  . atenolol  25 mg Oral QHS  . cholecalciferol  1,000 Units Oral Daily  . pantoprazole  40 mg Oral Daily  . propafenone  150 mg Oral BID  . sodium chloride  3 mL Intravenous Q12H  . sodium chloride  3 mL Intravenous Q12H  . Ticagrelor  90 mg Oral BID    Assessment/ Plan:    1. CAD:  Levie returned to the hospital the day after getting discharged after PCI.  Troponin levels are slightly higher.  It's difficult to tell if this mild Troponin bump is from the PCI procedure 3 days ago vs. Another  ACS ( ? Edge dissection, etc. )    Anticipate re-look today.   Orders written.   2.  Hyperlipidemia:   Disposition: she is stable on heparin drip.  Transfer to tele today.   3. Atrial fib:  She is still in NSR - cont  Rythmol.   Length of Stay: 3  Ammy Lienhard J. Aldyn Toon, Jr., MD, FACC 08/19/2012, 7:11 AM Office [phone] Pager [phone]    

## 2012-08-20 ENCOUNTER — Encounter (HOSPITAL_COMMUNITY): Payer: Self-pay | Admitting: Physician Assistant

## 2012-08-20 LAB — CBC
HCT: 39.4 % (ref 36.0–46.0)
MCH: 30.9 pg (ref 26.0–34.0)
MCV: 89.5 fL (ref 78.0–100.0)
RDW: 13.1 % (ref 11.5–15.5)
WBC: 5 10*3/uL (ref 4.0–10.5)

## 2012-08-20 MED ORDER — ROSUVASTATIN CALCIUM 5 MG PO TABS
5.0000 mg | ORAL_TABLET | ORAL | Status: DC
Start: 2012-08-20 — End: 2012-08-20

## 2012-08-20 NOTE — Progress Notes (Addendum)
PROGRESS NOTE  Subjective:   Shannon Cummings is a 67 yo with hx of CAD - s/p recent stent to LAD.   She was D/Cd home on 3/13 but  Presented with recurrent CP 3/14.  Troponins are minimally elevated but are trending downward.     Echo 3/15 shows ant. Apical severe hypokinesis.   Cath showed that the stent and all other arteries look very good.  I suspect her mild Troponin elevation may have been from some mild plaque embolization from the procedure.  The flow down the LAD is quite good.    Objective:    Vital Signs:   Temp:  [97.7 F (36.5 C)-97.8 F (36.6 C)] 97.8 F (36.6 C) (03/18 0504) Pulse Rate:  [51-65] 51 (03/18 0504) Resp:  [16-20] 16 (03/18 0504) BP: (97-128)/(61-86) 97/61 mmHg (03/18 0504) SpO2:  [97 %-99 %] 97 % (03/18 0504) Weight:  [136 lb 9.6 oz (61.961 kg)] 136 lb 9.6 oz (61.961 kg) (03/18 0504)  Last BM Date: 08/19/12   24-hour weight change: Weight change: -8 oz (-0.227 kg)  Weight trends: Filed Weights   08/17/12 0239 08/19/12 0500 08/20/12 0504  Weight: 137 lb 2 oz (62.2 kg) 137 lb 1.6 oz (62.188 kg) 136 lb 9.6 oz (61.961 kg)    Intake/Output:  03/17 0701 - 03/18 0700 In: 240 [P.O.:240] Out: -      Physical Exam: BP 97/61  Pulse 51  Temp(Src) 97.8 F (36.6 C) (Oral)  Resp 16  Ht 5' (1.524 m)  Wt 136 lb 9.6 oz (61.961 kg)  BMI 26.68 kg/m2  SpO2 97%  General: Vital signs reviewed and noted.   Head: Normocephalic, atraumatic.  Eyes: conjunctivae/corneas clear.  EOM's intact.   Throat: normal  Neck:  normal  Lungs:   clear  Heart:  RR, normal S1, S2  Abdomen:  Soft, non-tender, non-distended    Extremities:  no edema  Neurologic: A&O X3, CN II - XII are grossly intact.   Psych: Normal     Labs: BMET: No results found for this basename: NA, K, CL, CO2, GLUCOSE, BUN, CREATININE, CALCIUM, MG, PHOS,  in the last 72 hours  Liver function tests: No results found for this basename: AST, ALT, ALKPHOS, BILITOT, PROT, ALBUMIN,  in the last 72  hours No results found for this basename: LIPASE, AMYLASE,  in the last 72 hours  CBC:  Recent Labs  08/19/12 0500 08/20/12 0615  WBC 5.5 5.0  HGB 13.3 13.6  HCT 38.6 39.4  MCV 88.1 89.5  PLT 154 161    Cardiac Enzymes: No results found for this basename: CKTOTAL, CKMB, TROPONINI,  in the last 72 hours  Coagulation Studies: No results found for this basename: LABPROT, INR,  in the last 72 hours  Other: No components found with this basename: POCBNP,  No results found for this basename: DDIMER,  in the last 72 hours No results found for this basename: HGBA1C,  in the last 72 hours No results found for this basename: CHOL, HDL, LDLCALC, TRIG, CHOLHDL,  in the last 72 hours No results found for this basename: TSH, T4TOTAL, FREET3, T3FREE, THYROIDAB,  in the last 72 hours No results found for this basename: VITAMINB12, FOLATE, FERRITIN, TIBC, IRON, RETICCTPCT,  in the last 72 hours   Other results:  EKG :  NSR, No ST or T changes  Medications:    Infusions:    Scheduled Medications: . amLODipine  5 mg Oral QHS  . aspirin EC  81 mg  Oral Daily  . atenolol  25 mg Oral QHS  . cholecalciferol  1,000 Units Oral Daily  . pantoprazole  40 mg Oral Daily  . propafenone  150 mg Oral BID  . sodium chloride  3 mL Intravenous Q12H  . sodium chloride  3 mL Intravenous Q12H  . Ticagrelor  90 mg Oral BID    Assessment/ Plan:    1. CAD:  The stent looks fine.  I suspect the mild enzyme elevation occurred at the time of stent implantation.  Continue current meds  DC to home. Current meds for now ASA 81 brilinta 90 bid ntg crestor 5 mg - once a week rhythmol 150 BID Tenormin 25 daily norvast 5 daily   2.  Hyperlipidemia:  Will have her see the lipid clinic.  She mentioned trying Crestor once a week  3. Atrial fib:  She is still in NSR - cont Rythmol.   Length of Stay: 4  Vesta Mixer, Montez Hageman., MD, [facility] 08/20/2012, 11:51 AM Office [phone] Pager [phone]

## 2012-08-20 NOTE — Discharge Summary (Signed)
Discharge Summary   Patient ID: Shannon Cummings MRN: [id number], DOB/AGE: 09-01-1945 67 y.o. Admit date: 08/16/2012 D/C date:     08/20/2012  Primary Cardiologist: Samari Bittinger  Primary Discharge Diagnoses:  1. CAD with mildly elevated troponin this admission - recently discharged for NSTEMI s/p DES to prox LAD with EF 45% by cath 08/14/12, relook cath 08/19/12 showing patent stents (?elevated troponin due to mild plaque embolization at time of stent implantation) - history of prior stenting  2. History of PAF, on Rythmol  3. HTN  4. Dyslipidemia - intolerant to statins 5. Elevated TSH - already instructed to f/u PCP last admission 6. 8 mm subpleural nodular density within the right upper lobe by CT 08/14/12 - already instructed last admission to f/u with her pulm MD Dr. Maple Hudson  7. LV dysfunction  - EF 45-50% by echo this admission (45% by recent cath)  Secondary Discharge Diagnoses:  1. OSA on CPAP  2. Fatty infiltration of liver  3. Osteoporosis  4. GERD  5. Tubular adenoma of colon  Hospital Course: Shannon Cummings is a 67 y/o F with history of history of hypertension, dyslipidemia, paroxysmal atrial fibrillation,OSA on CPAP, and CAD with prior stenting as well as recent NSTEMI s/p DES to prox LAD with EF 45% by cath 08/14/12. She also had a pulm nodule and elevated TSH at that visit for which she was instructed to f/u pulm & PCP. She was discharged on 08/15/12 and returned to the hospital the following day with complaints of L-sided chest pain rated 8/10 a/w SOB. She took ASA 325 and 3 Nitro with some relief then came to the ER. ED she was given Morphine and placed on Nitro gtt for continued pain. EKG showed Sinus brady, TWI anterior leads, improved TWI in lateral leads from recent admission. CXR was nonacute. Troponins in the were 0.27->0.26 [facility]), then 0.53->0.44 (regular set). Her last troponin prior to previous dc had been 0.69. She was placed on IV heparin and observed over the weekend. 2D echo  showed EF 45-50% with severe hypokinesis of the anteroseptal and apical myocardium. Re-look cath yesterday showed continued patency of prox LAD stent, patent LCx, and patent RCA. Dr. Elease Hashimoto suggested that her mild troponin elevation may have been from some mild plaque embolization from the procedure at time of stent implantation. We will continue medical therapy. Dr. Elease Hashimoto has seen and examined her and feels she is stable for discharge. There was question of if she was interested in trying a statin, but I spoke with the patient and she is adamant that she does not want to take Crestor. She said she may be willing to try a different one in the future, but wants to wait until her followup appointment to discuss further.  Discharge Vitals: Blood pressure 107/67, pulse 52, temperature 97.5 F (36.4 C), temperature source Oral, resp. rate 18, height 5' (1.524 m), weight 136 lb 9.6 oz (61.961 kg), SpO2 98.00%.  Labs: Lab Results  Component Value Date   WBC 5.0 08/20/2012   HGB 13.6 08/20/2012   HCT 39.4 08/20/2012   MCV 89.5 08/20/2012   PLT 161 08/20/2012    Recent Labs Lab 08/16/12 2045  08/17/12 0700  NA 138  --  138  K 3.6  --  3.6  CL 104  --  102  CO2 24  --  23  BUN 14  --  13  CREATININE 0.84  < > 0.75  CALCIUM 8.4  --  8.6  PROT 6.7  --   --  BILITOT 0.5  --   --   ALKPHOS 62  --   --   ALT 21  --   --   AST 22  --   --   GLUCOSE 101*  --  108*  < > = values in this interval not displayed.  Lab Results  Component Value Date   CHOL 287* 03/08/2012   HDL 50.60 03/08/2012   LDLCALC 162* 11/23/2010   TRIG 154.0* 03/08/2012   Diagnostic Studies/Procedures   1. Cardiac catheterization this admission, please see full report and above for summary.  2. 2D Echo 08/17/12 Study Conclusions Left ventricle: The cavity size was normal. Wall thickness was normal. Systolic function was mildly reduced. The estimated ejection fraction was in the range of 45% to 50%. Severe hypokinesis of the  anteroseptal and apical myocardium. There was an increased relative contribution of atrial contraction to ventricular filling.  3. CXR 08/16/12: Findings: Heart size is within normal limits. Both lungs are clear. No evidence of pleural effusion. Mild tortuosity of thoracic aorta is stable. No mass or lymphadenopathy identified. IMPRESSION: Stable exam. No active disease.  Discharge Medications     Medication List    TAKE these medications       AMBULATORY NON FORMULARY MEDICATION  CPAP Machine  USES AS DIRECTED     amLODipine 5 MG tablet  Commonly known as:  NORVASC  Take 5 mg by mouth at bedtime.     aspirin 81 MG tablet  Take 1 tablet (81 mg total) by mouth daily.     atenolol 25 MG tablet  Commonly known as:  TENORMIN  Take 25 mg by mouth at bedtime.     cholecalciferol 1000 UNITS tablet  Commonly known as:  VITAMIN D  Take 1,000 Units by mouth daily.     estradiol 0.1 MG/GM vaginal cream  Commonly known as:  ESTRACE  Place 2 g vaginally 2 (two) times a week. Days of the week vary     nitroGLYCERIN 0.4 MG SL tablet  Commonly known as:  NITROSTAT  Place 1 tablet (0.4 mg total) under the tongue every 5 (five) minutes as needed for chest pain (up to 3 doses).     omeprazole 20 MG capsule  Commonly known as:  PRILOSEC  Take 20 mg by mouth daily.     propafenone 150 MG tablet  Commonly known as:  RYTHMOL  Take 150 mg by mouth 2 (two) times daily.     Ticagrelor 90 MG Tabs tablet  Commonly known as:  BRILINTA  Take 1 tablet (90 mg total) by mouth 2 (two) times daily.        Disposition   The patient will be discharged in stable condition to home. Discharge Orders   Future Appointments Provider Department Dept Phone   08/26/2012 9:30 AM Rosalio Macadamia, NP [facility] Main Office [place]) [phone]   11/27/2012 10:00 AM Waymon Budge, MD [facility] [phone]   Future Orders Complete By Expires     Diet - low sodium heart healthy  As  directed     Discharge instructions  As directed     Comments:      Your heart ultrasound showed mild weakness of the heart muscle this admission. This may make you more susceptible to weight gain from fluid retention, which can lead to symptoms that we call heart failure. For patients with this condition, we give them these special instructions:  1. Follow a low-salt diet and watch  your fluid intake. In general, you should not be taking in more than 2 liters of fluid per day. Some patients are restricted to less than 1.5 liters. This includes sources of water in foods like soup.  2. Weigh yourself on the same scale at same time of day and keep a log.  3. Call your doctor: (Anytime you feel any of the following symptoms)  - 3-4 pound weight gain in 1-2 days or 2 pounds overnight  - Shortness of breath, with or without a dry hacking cough  - Swelling in the hands, feet or stomach  - If you have to sleep on extra pillows at night in order to breathe   Continue to follow up as previously directed for your elevated TSH and pulmonary nodule found last admission.    Increase activity slowly  As directed     Scheduling Instructions:      No driving for 2 days. No lifting over 10 lbs for 10 days. No sexual activity for 10 days. Keep procedure site clean & dry. If you notice increased pain, swelling, bleeding or pus, call/return! You may shower, but no soaking baths/hot tubs/pools for 1 week.      Follow-up Information   Follow up with Norma Fredrickson, NP. (08/26/12 at 9:30am)    Contact information:   [address] [place] [address] [phone]         Duration of Discharge Encounter: Greater than 30 minutes including physician and PA time.  Signed, Ronie Spies PA-C 08/20/2012, 4:17 PM  Attending Note:   The patient was seen and examined.  Agree with assessment and plan as noted above.  Changes made to the above note as needed.  Pt is stable.  Will see her in the office  in several weeks.  Vesta Mixer, Montez Hageman., MD, [facility] 08/20/2012, 4:39 PM

## 2012-08-22 ENCOUNTER — Telehealth: Payer: Self-pay | Admitting: Cardiovascular Disease

## 2012-08-22 NOTE — Telephone Encounter (Signed)
TCM; spoke with patient who states she is feeling pretty well, just tired. Patient denies c/o chest pain, states has noticed that one of her medications causes SOB and she does feel some SOB but she is able to walk for 10 min per day twice a day.  Patient's questions regarding activity were answered per D/C instructions and patient verbalized understanding. Patient aware of appointment with Norma Fredrickson, NP 3/24 @ 0930.

## 2012-08-26 ENCOUNTER — Ambulatory Visit (INDEPENDENT_AMBULATORY_CARE_PROVIDER_SITE_OTHER): Payer: Medicare Other | Admitting: Nurse Practitioner

## 2012-08-26 ENCOUNTER — Encounter: Payer: Self-pay | Admitting: Nurse Practitioner

## 2012-08-26 VITALS — BP 110/84 | HR 56 | Ht 60.5 in | Wt 140.4 lb

## 2012-08-26 DIAGNOSIS — I2510 Atherosclerotic heart disease of native coronary artery without angina pectoris: Secondary | ICD-10-CM

## 2012-08-26 NOTE — Patient Instructions (Addendum)
Let us know about your decision regarding the statin - call Alvino Chapel, Dr. Harvie Bridge nurse to discuss  Dr. Elease Hashimoto will see you in a month  Stay on your current medicines  Call the [facility] office at [phone] if you have any questions, problems or concerns.

## 2012-08-26 NOTE — Progress Notes (Signed)
Shannon Cummings Date of Birth: [birth year] Medical Record #[id number]  History of Present Illness: Shannon Cummings is seen back today for a post hospital visit. She is seen for Shannon Cummings. She has known CAD with recent NSTEMI with DES to the proximal LAD with EF 45% per cath on 08/14/12 - relook cath 08/19/2012 showed patent stents. Other issues include PAF, on Rythmol, HTN, HLD with intolerance to statins, elevated TSH, right upper lobe lung nodule by CT 08/2012 - to follow up with her pulmonologist, OSA, fatty infiltration of the liver, GERD and tubular adenoma of the colon.   She was most recently admitted with recurrent chest pain - one day following discharge for her NSTEMI. Troponins were elevated and felt to possibly be due to plaque embolization at the time of stent implantation. She was recathed. Stent in the proximal LAD is patent. She had already been instructed from the first admission regarding her TSH and lung nodule. She continues to decline statin therapy.   She comes in today. She is here alone. Doing ok. No more chest pain. Not short of breath. Some soreness of the right arm, but overall, doing ok. Admits that she is a little scared. To start rehab later this week. Will be talking with one of her friends regarding their statin therapy and back in touch with [place]. She is willing to try low dose and maybe at just one to two times a week. Tolerating her other medicines. Still with some fatigue.   Current Outpatient Prescriptions on File Prior to Visit  Medication Sig Dispense Refill  . AMBULATORY NON FORMULARY MEDICATION CPAP Machine USES AS DIRECTED      . amLODipine (NORVASC) 5 MG tablet Take 5 mg by mouth at bedtime.      Marland Kitchen aspirin 81 MG tablet Take 1 tablet (81 mg total) by mouth daily.      Marland Kitchen atenolol (TENORMIN) 25 MG tablet Take 25 mg by mouth at bedtime.      . cholecalciferol (VITAMIN D) 1000 UNITS tablet Take 1,000 Units by mouth daily.      Marland Kitchen estradiol (ESTRACE) 0.1 MG/GM vaginal  cream Place 2 g vaginally 2 (two) times a week. Days of the week vary      . nitroGLYCERIN (NITROSTAT) 0.4 MG SL tablet Place 1 tablet (0.4 mg total) under the tongue every 5 (five) minutes as needed for chest pain (up to 3 doses).  25 tablet  4  . omeprazole (PRILOSEC) 20 MG capsule Take 20 mg by mouth daily.      . propafenone (RYTHMOL) 150 MG tablet Take 150 mg by mouth 2 (two) times daily.      . Ticagrelor (BRILINTA) 90 MG TABS tablet Take 1 tablet (90 mg total) by mouth 2 (two) times daily.  60 tablet  10   No current facility-administered medications on file prior to visit.    Allergies  Allergen Reactions  . Statins Other (See Comments)    Restless legs, bad feeling    Past Medical History  Diagnosis Date  . OSA on CPAP   . Hyperlipidemia   . Hypertension   . PAF (paroxysmal atrial fibrillation)     On rythmol.  . Tubular adenoma of colon 2007  . CAD (coronary artery disease)     a. prior stenting history. b. NSTEMI s/p DES to prox LAD with EF 45% by cath 08/14/12. c. Mild trop elevation shortly after cath ?mild plaque embolization - patent stent on relook.  . Osteoporosis   .  Fatty infiltration of liver   . GERD (gastroesophageal reflux disease)   . Arthritis   . Pulmonary nodule     a. 8mm subpleural nodular density CT 08/2012, instructed to f/u pulm MD.    Past Surgical History  Procedure Laterality Date  . Total abdominal hysterectomy    . Tonsillectomy    . Coronary stent placement  2000  . Coronary angioplasty with stent placement  08/14/2012    LAD  Shannon Cummings  . Rotator cuff repair      History  Smoking status  . Never Smoker   Smokeless tobacco  . Never Used    History  Alcohol Use  . Yes    Comment: 2 drinks daily     Family History  Problem Relation Age of Onset  . Breast cancer Maternal Aunt   . Heart disease Father   . Alzheimer's disease Mother   . Colon cancer Neg Hx     Review of Systems: The review of systems is per the  HPI.  All other systems were reviewed and are negative.  Physical Exam: BP 110/84  Pulse 56  Ht 5' 0.5" (1.537 m)  Wt 140 lb 6.4 oz (63.685 kg)  BMI 26.96 kg/m2 Patient is very pleasant and in no acute distress. Skin is warm and dry. Color is normal.  HEENT is unremarkable. Normocephalic/atraumatic. PERRL. Sclera are nonicteric. Neck is supple. No masses. No JVD. Lungs are clear. Cardiac exam shows a regular rate and rhythm. Abdomen is soft. Extremities are without edema. Gait and ROM are intact. No gross neurologic deficits noted. Right arm has some resolving bruising. Pulses are 2+ to the right hand.   LABORATORY DATA: Lab Results  Component Value Date   WBC 5.0 08/20/2012   HGB 13.6 08/20/2012   HCT 39.4 08/20/2012   PLT 161 08/20/2012   GLUCOSE 108* 08/17/2012   CHOL 287* 03/08/2012   TRIG 154.0* 03/08/2012   HDL 50.60 03/08/2012   LDLDIRECT 201.0 03/08/2012   LDLCALC 162* 11/23/2010   ALT 21 08/16/2012   AST 22 08/16/2012   NA 138 08/17/2012   K 3.6 08/17/2012   CL 102 08/17/2012   CREATININE 0.75 08/17/2012   BUN 13 08/17/2012   CO2 23 08/17/2012   TSH 4.848* 08/14/2012   INR 1.04 08/14/2012   HGBA1C 5.7* 08/14/2012   Dg Chest 2 View  08/16/2012   IMPRESSION: Stable exam.  No active disease.   Original Report Authenticated By: Myles Rosenthal, M.D.    Ct Angio Chest Aortic Dissect W &/or W/o  08/14/2012 IMPRESSION: Mild scattered atherosclerotic disease of the abdominal aorta and branch vessels.  No aneurysmal dilatation or dissection.  No acute abdominopelvic process identified by CT.   Original Report Authenticated By: Jearld Lesch, M.D.    Ct Angio Abd/pel W/ And/or W/o  08/14/2012  IMPRESSION: No aortic dissection or acute intrathoracic process.  Heart size upper normal to mildly enlarged.  8 mm subpleural nodular density within the right upper lobe. If the patient is at high risk for bronchogenic carcinoma, follow-up chest CT at 3-6 months is recommended.  If the patient is at low risk  for bronchogenic carcinoma, follow-up chest CT at 6-12 months is recommended.  This recommendation follows the consensus statement: Guidelines for Management of Small Pulmonary Nodules Detected on CT Scans: A Statement from the Fleischner Society as published in Radiology 2005; 237:395-400.    CTA ABDOMEN AND PELVIS   IMPRESSION: Mild scattered atherosclerotic disease of the  abdominal aorta and branch vessels.  No aneurysmal dilatation or dissection.  No acute abdominopelvic process identified by CT.   Original Report Authenticated By: Jearld Lesch, M.D.    Echo Study Conclusions March 2014  Left ventricle: The cavity size was normal. Wall thickness was normal. Systolic function was mildly reduced. The estimated ejection fraction was in the range of 45% to 50%. Severe hypokinesis of the anteroseptal and apical myocardium. There was an increased relative contribution of atrial contraction to ventricular filling.  Coronary angiography:   Left mainstem: Widely patent  Left anterior descending (LAD): Prox LAD stent is widely patent without restenosis. Mid/distal LAD is patent without obstructive disease.  Left circumflex (LCx): Large vessel, widely patent throughout. No significant change from previous study.  Right coronary artery (RCA): Moderate caliber, dominant vessel. No obstructive disease noted.   Left ventriculography: deferred   Final Conclusions:  1. Continued patency of the proximal LAD stent  2. Patent LCx and RCA   Recommendations: Continue current management, reassurance.   Tonny Cummings  08/19/2012, 3:15 PM   Assessment / Plan:  1. CAD with recent NSTEMI and stenting of the proximal LAD - with relook cath showing patency - on Brilinta - but having issues with affordibility - she is willing to continue. Will try to give samples as well. See Shannon Cummings back in a month. Start rehab later this week.   2. HDL - she will be back in touch with [place] regarding her decision.    3. Elevated TSH - to see her PCP  4. Lung nodule - seeing Shannon. Maple Hudson in early May. No smoking history reported.   5. PAF - in sinus on Rythmol.   6. Mild LV dysfunction - probable repeat her echo in 3 months to reassess.  Overall, she is doing ok. See her back in a month.   Patient is agreeable to this plan and will call if any problems develop in the interim.   Rosalio Macadamia, RN, ANP-C [facility] [address] [place], [place]  [address]

## 2012-08-29 ENCOUNTER — Encounter (HOSPITAL_COMMUNITY)
Admission: RE | Admit: 2012-08-29 | Discharge: 2012-08-29 | Disposition: A | Discharge status: Home / Self Care | Payer: Medicare Other | Source: Ambulatory Visit | Attending: Cardiovascular Disease | Admitting: Cardiovascular Disease

## 2012-08-29 DIAGNOSIS — I4891 Unspecified atrial fibrillation: Secondary | ICD-10-CM | POA: Insufficient documentation

## 2012-08-29 DIAGNOSIS — E785 Hyperlipidemia, unspecified: Secondary | ICD-10-CM | POA: Insufficient documentation

## 2012-08-29 DIAGNOSIS — Z8249 Family history of ischemic heart disease and other diseases of the circulatory system: Secondary | ICD-10-CM | POA: Insufficient documentation

## 2012-08-29 DIAGNOSIS — I214 Non-ST elevation (NSTEMI) myocardial infarction: Secondary | ICD-10-CM | POA: Insufficient documentation

## 2012-08-29 DIAGNOSIS — Z5189 Encounter for other specified aftercare: Secondary | ICD-10-CM | POA: Insufficient documentation

## 2012-08-29 DIAGNOSIS — G4733 Obstructive sleep apnea (adult) (pediatric): Secondary | ICD-10-CM | POA: Insufficient documentation

## 2012-08-29 DIAGNOSIS — I10 Essential (primary) hypertension: Secondary | ICD-10-CM | POA: Insufficient documentation

## 2012-08-29 DIAGNOSIS — I2510 Atherosclerotic heart disease of native coronary artery without angina pectoris: Secondary | ICD-10-CM | POA: Insufficient documentation

## 2012-08-29 NOTE — Progress Notes (Signed)
Cardiac Rehab Medication Review by a Pharmacist  Does the patient  feel that his/her medications are working for him/her?  yes  Has the patient been experiencing any side effects to the medications prescribed?  no  Does the patient measure his/her own blood pressure or blood glucose at home?  no   Does the patient have any problems obtaining medications due to transportation or finances?   no  Understanding of regimen: excellent Understanding of indications: excellent Potential of compliance: excellent    Pharmacist comments: none    Laurence Slate 08/29/2012 8:27 AM

## 2012-09-02 ENCOUNTER — Encounter (HOSPITAL_COMMUNITY): Payer: Self-pay

## 2012-09-02 ENCOUNTER — Encounter (HOSPITAL_COMMUNITY)
Admission: RE | Admit: 2012-09-02 | Discharge: 2012-09-02 | Disposition: A | Discharge status: Home / Self Care | Payer: Medicare Other | Source: Ambulatory Visit | Attending: Cardiovascular Disease | Admitting: Cardiovascular Disease

## 2012-09-02 NOTE — Progress Notes (Signed)
Pt started cardiac rehab today.  Pt tolerated light exercise without difficulty.  VSS, telemetry-NSR, t wave inversion.  Pt oriented to exercise equipment and routine.  Understanding verbalized.  PHQ score-zero.  Pt denies recent psychosocial changes or losses.  Overall positive outlook and demonstrates positive coping skills.

## 2012-09-04 ENCOUNTER — Encounter (HOSPITAL_COMMUNITY)
Admission: RE | Admit: 2012-09-04 | Discharge: 2012-09-04 | Disposition: A | Discharge status: Home / Self Care | Payer: Medicare Other | Source: Ambulatory Visit | Attending: Cardiovascular Disease | Admitting: Cardiovascular Disease

## 2012-09-04 DIAGNOSIS — Z5189 Encounter for other specified aftercare: Secondary | ICD-10-CM | POA: Insufficient documentation

## 2012-09-04 DIAGNOSIS — I214 Non-ST elevation (NSTEMI) myocardial infarction: Secondary | ICD-10-CM | POA: Insufficient documentation

## 2012-09-04 DIAGNOSIS — G4733 Obstructive sleep apnea (adult) (pediatric): Secondary | ICD-10-CM | POA: Insufficient documentation

## 2012-09-04 DIAGNOSIS — Z8249 Family history of ischemic heart disease and other diseases of the circulatory system: Secondary | ICD-10-CM | POA: Insufficient documentation

## 2012-09-04 DIAGNOSIS — I10 Essential (primary) hypertension: Secondary | ICD-10-CM | POA: Insufficient documentation

## 2012-09-04 DIAGNOSIS — E785 Hyperlipidemia, unspecified: Secondary | ICD-10-CM | POA: Insufficient documentation

## 2012-09-04 DIAGNOSIS — I2510 Atherosclerotic heart disease of native coronary artery without angina pectoris: Secondary | ICD-10-CM | POA: Insufficient documentation

## 2012-09-04 DIAGNOSIS — I4891 Unspecified atrial fibrillation: Secondary | ICD-10-CM | POA: Insufficient documentation

## 2012-09-06 ENCOUNTER — Encounter (HOSPITAL_COMMUNITY)
Admission: RE | Admit: 2012-09-06 | Discharge: 2012-09-06 | Disposition: A | Discharge status: Home / Self Care | Payer: Medicare Other | Source: Ambulatory Visit | Attending: Cardiovascular Disease | Admitting: Cardiovascular Disease

## 2012-09-09 ENCOUNTER — Encounter (HOSPITAL_COMMUNITY)
Admission: RE | Admit: 2012-09-09 | Discharge: 2012-09-09 | Disposition: A | Discharge status: Home / Self Care | Payer: Medicare Other | Source: Ambulatory Visit | Attending: Cardiovascular Disease | Admitting: Cardiovascular Disease

## 2012-09-11 ENCOUNTER — Encounter (HOSPITAL_COMMUNITY)
Admission: RE | Admit: 2012-09-11 | Discharge: 2012-09-11 | Disposition: A | Discharge status: Home / Self Care | Payer: Medicare Other | Source: Ambulatory Visit | Attending: Cardiovascular Disease | Admitting: Cardiovascular Disease

## 2012-09-13 ENCOUNTER — Encounter (HOSPITAL_COMMUNITY)
Admission: RE | Admit: 2012-09-13 | Discharge: 2012-09-13 | Disposition: A | Discharge status: Home / Self Care | Payer: Medicare Other | Source: Ambulatory Visit | Attending: Cardiovascular Disease | Admitting: Cardiovascular Disease

## 2012-09-16 ENCOUNTER — Encounter (HOSPITAL_COMMUNITY)
Admission: RE | Admit: 2012-09-16 | Discharge: 2012-09-16 | Disposition: A | Discharge status: Home / Self Care | Payer: Medicare Other | Source: Ambulatory Visit | Attending: Cardiovascular Disease | Admitting: Cardiovascular Disease

## 2012-09-18 ENCOUNTER — Encounter (HOSPITAL_COMMUNITY)
Admission: RE | Admit: 2012-09-18 | Discharge: 2012-09-18 | Disposition: A | Discharge status: Home / Self Care | Payer: Medicare Other | Source: Ambulatory Visit | Attending: Cardiovascular Disease | Admitting: Cardiovascular Disease

## 2012-09-18 NOTE — Progress Notes (Signed)
Shannon Cummings 67 y.o. female Nutrition Note Spoke with pt.  Nutrition Plan and Nutrition Survey goals reviewed with pt. Pt is following Step 2 of the Therapeutic Lifestyle Changes diet. Pt is intolerant to statins "but I may re-try a low dose since I had a heart attack." Increasing soluble fiber in the diet discussed. Pt wants to lose wt. Pt has been trying to lose wt by watching portion sizes. Wt loss tips reviewed. Pt's personal goal wt is 120-125 lbs. Pt is pre-diabetic according to A1c. Pre-diabetes discussed. Pt unaware of any family history of DM. Pt expressed understanding of the information reviewed. Pt aware of nutrition education classes offered and plans on attending nutrition classes.  Nutrition Diagnosis   Food-and nutrition-related knowledge deficit related to lack of exposure to information as related to diagnosis of: ? CVD ? Pre-DM (A1c 5.7)    Overweight related to excessive energy intake as evidenced by a BMI of 26.7  Nutrition RX/ Estimated Daily Nutrition Needs for: wt loss  1200 Kcal, 30 gm fat, 9 gm sat fat, 1.1 gm trans-fat, <1500 mg sodium   Nutrition Intervention   Pt's individual nutrition plan including cholesterol goals reviewed with pt.   Benefits of adopting Therapeutic Lifestyle Changes discussed when Medficts reviewed.   Pt to attend the Portion Distortion class   Pt to attend the  ? Nutrition I class                         ? Nutrition II class   Pt given handouts for: ? Nutrition I class ? Nutrition II class ? High Fiber diet   Continue client-centered nutrition education by RD, as part of interdisciplinary care.  Goal(s)   Pt to identify food quantities necessary to achieve: ? wt loss to a goal wt of 116-134 lb (52.6-60.8 kg) at graduation from cardiac rehab.   Monitor and Evaluate progress toward nutrition goal with team. Nutrition Risk:  Low   Mickle Plumb, M.Ed, RD, LDN, CDE 09/18/2012 2:47 PM

## 2012-09-20 ENCOUNTER — Encounter (HOSPITAL_COMMUNITY)
Admission: RE | Admit: 2012-09-20 | Discharge: 2012-09-20 | Disposition: A | Discharge status: Home / Self Care | Payer: Medicare Other | Source: Ambulatory Visit | Attending: Cardiovascular Disease | Admitting: Cardiovascular Disease

## 2012-09-23 ENCOUNTER — Encounter (HOSPITAL_COMMUNITY): Payer: Medicare Other

## 2012-09-25 ENCOUNTER — Encounter (HOSPITAL_COMMUNITY)
Admission: RE | Admit: 2012-09-25 | Discharge: 2012-09-25 | Disposition: A | Discharge status: Home / Self Care | Payer: Medicare Other | Source: Ambulatory Visit | Attending: Cardiovascular Disease | Admitting: Cardiovascular Disease

## 2012-09-25 NOTE — Progress Notes (Signed)
Pt arrived at cardiac rehab reporting episode of weakness at home.  Pt reports this occurs occasionally. Pt states she did not have access to her BP monitor to check VS.  Pt denies missed meds, meals and reports adequate PO fluid intake. Pt advised to report symptoms if they occur with exercise. Pt also advised to continue to drink adequate fluid. BP low normal. Pt has scheduled cardiology appt next week.  Will continue to monitor. Understanding verbalized

## 2012-09-27 ENCOUNTER — Encounter (HOSPITAL_COMMUNITY)
Admission: RE | Admit: 2012-09-27 | Discharge: 2012-09-27 | Disposition: A | Discharge status: Home / Self Care | Payer: Medicare Other | Source: Ambulatory Visit | Attending: Cardiovascular Disease | Admitting: Cardiovascular Disease

## 2012-09-30 ENCOUNTER — Encounter (HOSPITAL_COMMUNITY)
Admission: RE | Admit: 2012-09-30 | Discharge: 2012-09-30 | Disposition: A | Discharge status: Home / Self Care | Payer: Medicare Other | Source: Ambulatory Visit | Attending: Cardiovascular Disease | Admitting: Cardiovascular Disease

## 2012-10-01 ENCOUNTER — Ambulatory Visit (INDEPENDENT_AMBULATORY_CARE_PROVIDER_SITE_OTHER): Payer: Medicare Other | Admitting: Cardiovascular Disease

## 2012-10-01 ENCOUNTER — Encounter: Payer: Self-pay | Admitting: Cardiovascular Disease

## 2012-10-01 VITALS — BP 118/68 | HR 54 | Ht 60.05 in | Wt 137.4 lb

## 2012-10-01 DIAGNOSIS — I4891 Unspecified atrial fibrillation: Secondary | ICD-10-CM

## 2012-10-01 DIAGNOSIS — I2510 Atherosclerotic heart disease of native coronary artery without angina pectoris: Secondary | ICD-10-CM

## 2012-10-01 DIAGNOSIS — E785 Hyperlipidemia, unspecified: Secondary | ICD-10-CM

## 2012-10-01 MED ORDER — ATORVASTATIN CALCIUM 20 MG PO TABS: ORAL_TABLET | ORAL | Status: DC

## 2012-10-01 NOTE — Addendum Note (Signed)
Addended by: Meda Klinefelter D on: 10/01/2012 10:45 AM   Modules accepted: Orders

## 2012-10-01 NOTE — Assessment & Plan Note (Signed)
Shannon Cummings  is doing very well. She's not had any further episodes of chest pain since stenting of her LAD in March

## 2012-10-01 NOTE — Progress Notes (Signed)
Shannon Cummings Date of Birth  [birth year] [facility] / [facility] [address]     [address] [place], [place]  [address] [phone]  Fax  [phone]  Problem list: 1. Coronary artery disease-status post stenting in the past.  She status post stenting of her proximal LAD  08/14/2012 2. Intermittent atrial fibrillation Repair hyperlipidemia-she has been generally intolerant to most statins  History of Present Illness:  Pt is doing well.  No cardiac complaints.     She complains of some tingling and numbness associated with the Crestor.  She has discontinued the Crestor because of that reason.  She has had reactions to most statins that we have tried.  October 08-2011  She has done well.  She has not had any angina or eposides of atrial fib.  She has retired and is traveling quite a bit.  Her husband has also retired.  She has not had any problems.  October 01, 2012:  She is having some bruising ( due to [place]).  She is enjoying the cardiac rehab.  She has noticed some low BP readings at the end of cardiac rehab.  She does not feel bad.  These low readings are typically asymptomatic.    No angina.  No arrhythmias.   Current Outpatient Prescriptions on File Prior to Visit  Medication Sig Dispense Refill  . AMBULATORY NON FORMULARY MEDICATION CPAP Machine USES AS DIRECTED      . amLODipine (NORVASC) 5 MG tablet Take 5 mg by mouth at bedtime.      Marland Kitchen aspirin 81 MG tablet Take 1 tablet (81 mg total) by mouth daily.      Marland Kitchen atenolol (TENORMIN) 25 MG tablet Take 25 mg by mouth at bedtime.      . cholecalciferol (VITAMIN D) 1000 UNITS tablet Take 1,000 Units by mouth daily.      Marland Kitchen estradiol (ESTRACE) 0.1 MG/GM vaginal cream Place 2 g vaginally 2 (two) times a week. Days of the week vary      . nitroGLYCERIN (NITROSTAT) 0.4 MG SL tablet Place 1 tablet (0.4 mg total) under the tongue every 5 (five) minutes as needed for chest pain (up to 3 doses).  25 tablet   4  . omeprazole (PRILOSEC) 20 MG capsule Take 20 mg by mouth daily.      . propafenone (RYTHMOL) 150 MG tablet Take 150 mg by mouth 2 (two) times daily.      . Ticagrelor (BRILINTA) 90 MG TABS tablet Take 1 tablet (90 mg total) by mouth 2 (two) times daily.  60 tablet  10   No current facility-administered medications on file prior to visit.    Allergies  Allergen Reactions  . Statins Other (See Comments)    Restless legs, bad feeling    Past Medical History  Diagnosis Date  . OSA on CPAP   . Hyperlipidemia   . Hypertension   . PAF (paroxysmal atrial fibrillation)     On rythmol.  . Tubular adenoma of colon 2007  . CAD (coronary artery disease)     a. prior stenting history. b. NSTEMI s/p DES to prox LAD with EF 45% by cath 08/14/12. c. Mild trop elevation shortly after cath ?mild plaque embolization - patent stent on relook.  . Osteoporosis   . Fatty infiltration of liver   . GERD (gastroesophageal reflux disease)   . Arthritis   . Pulmonary nodule     a. 8mm subpleural nodular density CT 08/2012, instructed to  f/u pulm MD.    Past Surgical History  Procedure Laterality Date  . Total abdominal hysterectomy    . Tonsillectomy    . Coronary stent placement  2000  . Coronary angioplasty with stent placement  08/14/2012    LAD  Dr Tonny Bollman  . Rotator cuff repair      History  Smoking status  . Never Smoker   Smokeless tobacco  . Never Used    History  Alcohol Use  . Yes    Comment: 2 drinks daily     Family History  Problem Relation Age of Onset  . Breast cancer Maternal Aunt   . Heart disease Father   . Alzheimer's disease Mother   . Colon cancer Neg Hx     Reviw of Systems:  Reviewed in the HPI.  All other systems are negative.  Physical Exam: BP 118/68  Pulse 54  Ht 5' 0.05" (1.525 m)  Wt 137 lb 6.4 oz (62.324 kg)  BMI 26.8 kg/m2  SpO2 98% The patient is alert and oriented x 3.  The mood and affect are normal.   Skin: warm and dry.  Color  is normal.    HEENT:   the sclera are nonicteric.  The mucous membranes are moist.  The carotids are 2+ without bruits.  There is no thyromegaly.  There is no JVD.    Lungs: clear.  The chest wall is non tender.    Heart: regular rate with a normal S1 and S2.  There are no murmurs, gallops, or rubs. The PMI is not displaced.     Abdomin: good bowel sounds.  There is no guarding or rebound.  There is no hepatosplenomegaly or tenderness.  There are no masses.   Extremities:  no clubbing, cyanosis, or edema.  The legs are without rashes.  The distal pulses are intact.   Neuro:  Cranial nerves II - XII are intact.  Motor and sensory functions are intact.    The gait is normal.  Assessment / Plan:

## 2012-10-01 NOTE — Assessment & Plan Note (Signed)
Shannon Cummings  has had problems taking statins in the past. She's willing to try atorvastatin one more time. We'll try her on 20 mg twice a week. We'll check fasting labs tomorrow to get a baseline. We'll check fasting labs in 3 months to follow up on the recent addition of atorvastatin. I'll see her in 6 months for followup office visit, fasting lipids, hepatic profile, and basic metabolic profile.

## 2012-10-01 NOTE — Patient Instructions (Addendum)
Come to office 10/02/12 for fasting lab work Lab starts at 7:30 am  Start Atorvastatin 20 mg twice a week   Your physician recommends that you schedule a follow-up fasting lab in 3 months Tuesday 12/31/12 lab starts at 7:30 am    Your physician wants you to follow-up in: 6 months with fasting lab work and a EKG .You will receive a reminder letter in the mail two months in advance. If you don't receive a letter, please call our office to schedule the follow-up appointment.

## 2012-10-01 NOTE — Assessment & Plan Note (Signed)
Clinically she remains in normal sinus rhythm. We had some discussion about radiofrequency ablation. Right now the Rythmol seems to be working quite well.  We will have her see Dr. Johney Frame at some point in the future she decides to have RF ablation of her atrial fibrillation.

## 2012-10-02 ENCOUNTER — Encounter (HOSPITAL_COMMUNITY)
Admission: RE | Admit: 2012-10-02 | Discharge: 2012-10-02 | Disposition: A | Discharge status: Home / Self Care | Payer: Medicare Other | Source: Ambulatory Visit | Attending: Cardiovascular Disease | Admitting: Cardiovascular Disease

## 2012-10-02 ENCOUNTER — Other Ambulatory Visit (INDEPENDENT_AMBULATORY_CARE_PROVIDER_SITE_OTHER): Payer: Medicare Other

## 2012-10-02 DIAGNOSIS — E785 Hyperlipidemia, unspecified: Secondary | ICD-10-CM

## 2012-10-02 LAB — BASIC METABOLIC PANEL
BUN: 13 mg/dL (ref 6–23)
CO2: 29 mEq/L (ref 19–32)
Calcium: 8.9 mg/dL (ref 8.4–10.5)
Creatinine, Ser: 0.9 mg/dL (ref 0.4–1.2)
GFR: 66.53 mL/min (ref >60.00)
Glucose, Bld: 87 mg/dL (ref 70–99)
Sodium: 138 mEq/L (ref 135–145)

## 2012-10-02 LAB — LIPID PANEL
HDL: 53.6 mg/dL (ref >39.00)
Triglycerides: 115 mg/dL (ref 0.0–149.0)

## 2012-10-02 LAB — LDL CHOLESTEROL, DIRECT: Direct LDL: 192.6 mg/dL

## 2012-10-02 LAB — HEPATIC FUNCTION PANEL
Albumin: 4.5 g/dL (ref 3.5–5.2)
Total Protein: 7.4 g/dL (ref 6.0–8.3)

## 2012-10-02 NOTE — Progress Notes (Signed)
Post exercise heart rate noted at 52 this afternoon. patient asymptomatic.  Blood pressure was in the 90-100's systolic today.  Gregory said she was told by Dr Elease Hashimoto that she can cut her amlodipine in half if her blood pressure run low.  Today's exercise flow sheet and ECG tracing faxed to Nahser's office for review.

## 2012-10-03 ENCOUNTER — Encounter: Payer: Self-pay | Admitting: Internal Medicine

## 2012-10-03 ENCOUNTER — Ambulatory Visit (INDEPENDENT_AMBULATORY_CARE_PROVIDER_SITE_OTHER): Payer: Medicare Other | Admitting: Internal Medicine

## 2012-10-03 VITALS — BP 110/72 | HR 84 | Ht 61.0 in | Wt 138.4 lb

## 2012-10-03 DIAGNOSIS — G4733 Obstructive sleep apnea (adult) (pediatric): Secondary | ICD-10-CM

## 2012-10-03 DIAGNOSIS — R911 Solitary pulmonary nodule: Secondary | ICD-10-CM

## 2012-10-03 NOTE — Progress Notes (Signed)
Subjective:    Patient ID: Shannon Cummings, female    DOB: 1945-08-29, 67 y.o.   MRN: [id number]  HPI 12/28/10- 77 yoF never smoker followed for Sleep apnea, complicated by HBP, CAD, AFib Last here June 15, 2010 Continues very compliant with CPAP at 6, used all night every night. Advanced changed to to a nasal pillows design that loops around ears.  She does find the temazepam works very well for rare use. CPAP mask leaks do wake her occasionally .  11/28/11- 65 yoF never smoker followed for Sleep apnea, complicated by HBP, CAD, AFib Wears CPAP 6/Advanced every night about 6-7 hours at night; hard to get used to hose and items while sleeping; Temazepam still working well and needs RX- used about once per week. She needs help understanding how to clean her humidifier reservoir and tried sleeping last night without it because it seems dirty. I directed her to Advanced. She has noted a little wheeze occasionally, and need to sigh. She thinks she might be a little more short of breath while talking. Denies cough wheeze chest pain or palpitation. She brings the report of CT abdomen done at [facility] on 11/06/2011. This mentioned some atelectasis in the lung bases while focusing on hepatic steatosis and atherosclerotic vascular changes. We discussed atelectasis and reviewed most recent chest x-ray in our system. CXR 11/22/10-  IMPRESSION:  1. No acute cardiopulmonary abnormalities.  Original Report Authenticated By: Rosealee Albee, M.D.   10/03/12- 57 yoF never smoker followed for Sleep apnea, complicated by HBP, CAD/ MI, AFib FOLLOWS FOR: follow up per CY based on CXR showing lung nodules-found when had heart attack 08-2012. MI in March, 2014 CPAP 6/ Advanced- good compliance and control. CT chest 08/14/12 IMPRESSION:  No aortic dissection or acute intrathoracic process.  Heart size upper normal to mildly enlarged.  8 mm subpleural nodular density within the right upper lobe. If the   patient is at high risk for bronchogenic carcinoma, follow-up chest  CT at 3-6 months is recommended. If the patient is at low risk for  bronchogenic carcinoma, follow-up chest CT at 6-12 months is  recommended. This recommendation follows the consensus statement:  Guidelines for Management of Small Pulmonary Nodules Detected on CT  Scans: A Statement from the Fleischner Society as published in  Radiology 2005; 237:395-400. CXR 08/16/12 IMPRESSION:  Stable exam. No active disease.  Original Report Authenticated By: Myles Rosenthal, M.D.  ROS-see HPI Constitutional:   No-   weight loss, night sweats, fevers, chills, fatigue, lassitude. HEENT:   No-  headaches, difficulty swallowing, tooth/dental problems, sore throat,       No-  sneezing, itching, ear ache, nasal congestion, post nasal drip,  CV:  No-   chest pain, orthopnea, PND, swelling in lower extremities, anasarca, dizziness, palpitations Resp: +  shortness of breath with exertion or at rest.              No-   productive cough,  No non-productive cough,  No- coughing up of blood.              No-   change in color of mucus.  + wheezing.   Skin: No-   rash or lesions. GI:  No-   heartburn, indigestion, abdominal pain, nausea, vomiting,  GU:  MS:  No-   joint pain or swelling.  Neuro-     nothing unusual Psych:  No- change in mood or affect. No depression or anxiety.  No memory loss.  Objective:  Physical Exam General- Alert, Oriented, Affect-appropriate, Distress- none acute, talkative, medium build Skin- rash-none, lesions- none, excoriation- none Lymphadenopathy- none Head- atraumatic            Eyes- Gross vision intact, PERRLA, conjunctivae clear secretions            Ears- Hearing, canals            Nose- Clear, no-Septal dev, mucus, polyps, erosion, perforation             Throat- Mallampati II-III , mucosa clear , drainage- none, tonsils- atrophic Neck- flexible , trachea midline, no stridor , thyroid nl, carotid no  bruit Chest - symmetrical excursion , unlabored           Heart/CV- RRR- clinically she is in sinus rhythm , no murmur , no gallop  , no rub, nl s1 s2                                              - JVD- none , edema- none, stasis changes- none, varices- none           Lung- clear to P&A, wheeze- none, cough- none , dullness-none, rub- none           Chest wall-  Abd-  Br/ Gen/ Rectal- Not done, not indicated Extrem- cyanosis- none, clubbing, none, atrophy- none, strength- nl Neuro- grossly intact to observation Assessment & Plan:

## 2012-10-03 NOTE — Patient Instructions (Addendum)
Order- Schedule non-contrast CT chest in 4 months   Dx lung nodule on 08/14/12 Chest CT  Please call as needed

## 2012-10-04 ENCOUNTER — Encounter (HOSPITAL_COMMUNITY)
Admission: RE | Admit: 2012-10-04 | Discharge: 2012-10-04 | Disposition: A | Discharge status: Home / Self Care | Payer: Medicare Other | Source: Ambulatory Visit | Attending: Cardiovascular Disease | Admitting: Cardiovascular Disease

## 2012-10-04 DIAGNOSIS — I2510 Atherosclerotic heart disease of native coronary artery without angina pectoris: Secondary | ICD-10-CM | POA: Insufficient documentation

## 2012-10-04 DIAGNOSIS — I4891 Unspecified atrial fibrillation: Secondary | ICD-10-CM | POA: Insufficient documentation

## 2012-10-04 DIAGNOSIS — Z5189 Encounter for other specified aftercare: Secondary | ICD-10-CM | POA: Insufficient documentation

## 2012-10-04 DIAGNOSIS — I214 Non-ST elevation (NSTEMI) myocardial infarction: Secondary | ICD-10-CM | POA: Insufficient documentation

## 2012-10-04 DIAGNOSIS — Z8249 Family history of ischemic heart disease and other diseases of the circulatory system: Secondary | ICD-10-CM | POA: Insufficient documentation

## 2012-10-04 DIAGNOSIS — I10 Essential (primary) hypertension: Secondary | ICD-10-CM | POA: Insufficient documentation

## 2012-10-04 DIAGNOSIS — G4733 Obstructive sleep apnea (adult) (pediatric): Secondary | ICD-10-CM | POA: Insufficient documentation

## 2012-10-04 DIAGNOSIS — E785 Hyperlipidemia, unspecified: Secondary | ICD-10-CM | POA: Insufficient documentation

## 2012-10-07 ENCOUNTER — Encounter (HOSPITAL_COMMUNITY)
Admission: RE | Admit: 2012-10-07 | Discharge: 2012-10-07 | Disposition: A | Discharge status: Home / Self Care | Payer: Medicare Other | Source: Ambulatory Visit | Attending: Cardiovascular Disease | Admitting: Cardiovascular Disease

## 2012-10-07 NOTE — Progress Notes (Signed)
Pt arrived at cardiac rehab reporting she has decreased her norvasc to 1/2 tab daily.  Pt home BP monitor calibrated with staff and appears to be functioning properly.  Will monitor pt BP closely with exercise and send report to Dr. Elease Hashimoto.

## 2012-10-09 ENCOUNTER — Encounter (HOSPITAL_COMMUNITY)
Admission: RE | Admit: 2012-10-09 | Discharge: 2012-10-09 | Disposition: A | Discharge status: Home / Self Care | Payer: Medicare Other | Source: Ambulatory Visit | Attending: Cardiovascular Disease | Admitting: Cardiovascular Disease

## 2012-10-11 ENCOUNTER — Encounter (HOSPITAL_COMMUNITY)
Admission: RE | Admit: 2012-10-11 | Discharge: 2012-10-11 | Disposition: A | Discharge status: Home / Self Care | Payer: Medicare Other | Source: Ambulatory Visit | Attending: Cardiovascular Disease | Admitting: Cardiovascular Disease

## 2012-10-13 DIAGNOSIS — R911 Solitary pulmonary nodule: Secondary | ICD-10-CM | POA: Insufficient documentation

## 2012-10-13 NOTE — Assessment & Plan Note (Signed)
Plan-followup CT chest in 4 months (July, 2014)

## 2012-10-13 NOTE — Assessment & Plan Note (Signed)
Good compliance and control. No changes are appropriate now.

## 2012-10-14 ENCOUNTER — Encounter (HOSPITAL_COMMUNITY)
Admission: RE | Admit: 2012-10-14 | Discharge: 2012-10-14 | Disposition: A | Discharge status: Home / Self Care | Payer: Medicare Other | Source: Ambulatory Visit | Attending: Cardiovascular Disease | Admitting: Cardiovascular Disease

## 2012-10-16 ENCOUNTER — Encounter (HOSPITAL_COMMUNITY)
Admission: RE | Admit: 2012-10-16 | Discharge: 2012-10-16 | Disposition: A | Discharge status: Home / Self Care | Payer: Medicare Other | Source: Ambulatory Visit | Attending: Cardiovascular Disease | Admitting: Cardiovascular Disease

## 2012-10-18 ENCOUNTER — Encounter (HOSPITAL_COMMUNITY)
Admission: RE | Admit: 2012-10-18 | Discharge: 2012-10-18 | Disposition: A | Discharge status: Home / Self Care | Payer: Medicare Other | Source: Ambulatory Visit | Attending: Cardiovascular Disease | Admitting: Cardiovascular Disease

## 2012-10-21 ENCOUNTER — Encounter (HOSPITAL_COMMUNITY)
Admission: RE | Admit: 2012-10-21 | Discharge: 2012-10-21 | Disposition: A | Discharge status: Home / Self Care | Payer: Medicare Other | Source: Ambulatory Visit | Attending: Cardiovascular Disease | Admitting: Cardiovascular Disease

## 2012-10-23 ENCOUNTER — Encounter (HOSPITAL_COMMUNITY)
Admission: RE | Admit: 2012-10-23 | Discharge: 2012-10-23 | Disposition: A | Discharge status: Home / Self Care | Payer: Medicare Other | Source: Ambulatory Visit | Attending: Cardiovascular Disease | Admitting: Cardiovascular Disease

## 2012-10-23 ENCOUNTER — Telehealth: Payer: Self-pay | Admitting: *Deleted

## 2012-10-23 NOTE — Telephone Encounter (Signed)
Message copied by Antony Odea on Wed Oct 23, 2012  5:02 PM ------      Message from: [place], [place]      Created: Thu Oct 03, 2012  6:14 PM       Her cholesterol levels are still very elevated. If she develops muscle aches on the daily atorvastatin, have her take her atorvastatin every other day or perhaps every third day. We will recheck her lipids again in 3 months. ------

## 2012-10-23 NOTE — Telephone Encounter (Signed)
Pt taking 2 x weekly with some muscle aches, she is unable to increase.

## 2012-10-25 ENCOUNTER — Encounter (HOSPITAL_COMMUNITY): Payer: Medicare Other

## 2012-10-28 ENCOUNTER — Encounter (HOSPITAL_COMMUNITY): Payer: Medicare Other

## 2012-10-30 ENCOUNTER — Encounter (HOSPITAL_COMMUNITY)
Admission: RE | Admit: 2012-10-30 | Discharge: 2012-10-30 | Disposition: A | Discharge status: Home / Self Care | Payer: Medicare Other | Source: Ambulatory Visit | Attending: Cardiovascular Disease | Admitting: Cardiovascular Disease

## 2012-11-01 ENCOUNTER — Encounter (HOSPITAL_COMMUNITY)
Admission: RE | Admit: 2012-11-01 | Discharge: 2012-11-01 | Disposition: A | Discharge status: Home / Self Care | Payer: Medicare Other | Source: Ambulatory Visit | Attending: Cardiovascular Disease | Admitting: Cardiovascular Disease

## 2012-11-04 ENCOUNTER — Encounter (HOSPITAL_COMMUNITY)
Admission: RE | Admit: 2012-11-04 | Discharge: 2012-11-04 | Disposition: A | Discharge status: Home / Self Care | Payer: Medicare Other | Source: Ambulatory Visit | Attending: Cardiovascular Disease | Admitting: Cardiovascular Disease

## 2012-11-04 DIAGNOSIS — Z5189 Encounter for other specified aftercare: Secondary | ICD-10-CM | POA: Insufficient documentation

## 2012-11-04 DIAGNOSIS — I214 Non-ST elevation (NSTEMI) myocardial infarction: Secondary | ICD-10-CM | POA: Insufficient documentation

## 2012-11-04 DIAGNOSIS — I2510 Atherosclerotic heart disease of native coronary artery without angina pectoris: Secondary | ICD-10-CM | POA: Insufficient documentation

## 2012-11-04 DIAGNOSIS — E785 Hyperlipidemia, unspecified: Secondary | ICD-10-CM | POA: Insufficient documentation

## 2012-11-04 DIAGNOSIS — Z8249 Family history of ischemic heart disease and other diseases of the circulatory system: Secondary | ICD-10-CM | POA: Insufficient documentation

## 2012-11-04 DIAGNOSIS — I10 Essential (primary) hypertension: Secondary | ICD-10-CM | POA: Insufficient documentation

## 2012-11-04 DIAGNOSIS — I4891 Unspecified atrial fibrillation: Secondary | ICD-10-CM | POA: Insufficient documentation

## 2012-11-04 DIAGNOSIS — G4733 Obstructive sleep apnea (adult) (pediatric): Secondary | ICD-10-CM | POA: Insufficient documentation

## 2012-11-06 ENCOUNTER — Encounter (HOSPITAL_COMMUNITY)
Admission: RE | Admit: 2012-11-06 | Discharge: 2012-11-06 | Disposition: A | Discharge status: Home / Self Care | Payer: Medicare Other | Source: Ambulatory Visit | Attending: Cardiovascular Disease | Admitting: Cardiovascular Disease

## 2012-11-08 ENCOUNTER — Encounter (HOSPITAL_COMMUNITY)
Admission: RE | Admit: 2012-11-08 | Discharge: 2012-11-08 | Disposition: A | Discharge status: Home / Self Care | Payer: Medicare Other | Source: Ambulatory Visit | Attending: Cardiovascular Disease | Admitting: Cardiovascular Disease

## 2012-11-11 ENCOUNTER — Encounter (HOSPITAL_COMMUNITY)
Admission: RE | Admit: 2012-11-11 | Discharge: 2012-11-11 | Disposition: A | Discharge status: Home / Self Care | Payer: Medicare Other | Source: Ambulatory Visit | Attending: Cardiovascular Disease | Admitting: Cardiovascular Disease

## 2012-11-13 ENCOUNTER — Encounter (HOSPITAL_COMMUNITY)
Admission: RE | Admit: 2012-11-13 | Discharge: 2012-11-13 | Disposition: A | Discharge status: Home / Self Care | Payer: Medicare Other | Source: Ambulatory Visit | Attending: Cardiovascular Disease | Admitting: Cardiovascular Disease

## 2012-11-15 ENCOUNTER — Encounter (HOSPITAL_COMMUNITY): Payer: Medicare Other

## 2012-11-18 ENCOUNTER — Encounter (HOSPITAL_COMMUNITY)
Admission: RE | Admit: 2012-11-18 | Discharge: 2012-11-18 | Disposition: A | Discharge status: Home / Self Care | Payer: Medicare Other | Source: Ambulatory Visit | Attending: Cardiovascular Disease | Admitting: Cardiovascular Disease

## 2012-11-20 ENCOUNTER — Encounter (HOSPITAL_COMMUNITY)
Admission: RE | Admit: 2012-11-20 | Discharge: 2012-11-20 | Disposition: A | Discharge status: Home / Self Care | Payer: Medicare Other | Source: Ambulatory Visit | Attending: Cardiovascular Disease | Admitting: Cardiovascular Disease

## 2012-11-22 ENCOUNTER — Encounter (HOSPITAL_COMMUNITY)
Admission: RE | Admit: 2012-11-22 | Discharge: 2012-11-22 | Disposition: A | Discharge status: Home / Self Care | Payer: Medicare Other | Source: Ambulatory Visit | Attending: Cardiovascular Disease | Admitting: Cardiovascular Disease

## 2012-11-25 ENCOUNTER — Encounter (HOSPITAL_COMMUNITY)
Admission: RE | Admit: 2012-11-25 | Discharge: 2012-11-25 | Disposition: A | Discharge status: Home / Self Care | Payer: Medicare Other | Source: Ambulatory Visit | Attending: Cardiovascular Disease | Admitting: Cardiovascular Disease

## 2012-11-27 ENCOUNTER — Ambulatory Visit: Payer: Medicare Other | Admitting: Internal Medicine

## 2012-11-27 ENCOUNTER — Encounter (HOSPITAL_COMMUNITY)
Admission: RE | Admit: 2012-11-27 | Discharge: 2012-11-27 | Disposition: A | Discharge status: Home / Self Care | Payer: Medicare Other | Source: Ambulatory Visit | Attending: Cardiovascular Disease | Admitting: Cardiovascular Disease

## 2012-11-29 ENCOUNTER — Encounter (HOSPITAL_COMMUNITY)
Admission: RE | Admit: 2012-11-29 | Discharge: 2012-11-29 | Disposition: A | Discharge status: Home / Self Care | Payer: Medicare Other | Source: Ambulatory Visit | Attending: Cardiovascular Disease | Admitting: Cardiovascular Disease

## 2012-11-29 ENCOUNTER — Telehealth: Payer: Self-pay | Admitting: *Deleted

## 2012-11-29 NOTE — Telephone Encounter (Signed)
Cardiac rehab/ [facility], called because pt had cp 4/10 that resolved immediately with stopping exercise, no other sx noted, denies nausea sob  pt stated she had been doing outside work all morning long in the heat. bp @ that time bp was 156/90, current 144/88 and pt is pain free. Last cath 08/19/12 for cp and all vessels were widely patent. Per Dr Elease Hashimoto, rest/ hydrate, call with further symptoms and use nitro if needed, Nurse/ [facility] will inform pt.

## 2012-11-29 NOTE — Progress Notes (Signed)
Pt c/o chest tightness with exercise today at cardiac rehab.  BP-156/90,  Recheck 144/88.  Pt rates 4/10, describes as an ache,  denies other associated symptoms or radiation of pain. Pain immediately relieved with rest.  Pt states she is fatigued today as she worked in the yard 4 hours before coming to rehab.  Informed Jodette, Dr Harvie Bridge nurse.  Jodette will review pt symptoms with Dr Elease Hashimoto and will notify pt if any change in regimen is indicated.  Otherwise pt advised to call MD if symptoms persist or worsen, proper use of NTG and when to call 911.  Understanding verbalized

## 2012-12-02 ENCOUNTER — Encounter (HOSPITAL_COMMUNITY)
Admission: RE | Admit: 2012-12-02 | Discharge: 2012-12-02 | Disposition: A | Discharge status: Home / Self Care | Payer: Medicare Other | Source: Ambulatory Visit | Attending: Cardiovascular Disease | Admitting: Cardiovascular Disease

## 2012-12-04 ENCOUNTER — Encounter (HOSPITAL_COMMUNITY)
Admission: RE | Admit: 2012-12-04 | Discharge: 2012-12-04 | Disposition: A | Discharge status: Home / Self Care | Payer: Medicare Other | Source: Ambulatory Visit | Attending: Cardiovascular Disease | Admitting: Cardiovascular Disease

## 2012-12-04 ENCOUNTER — Encounter (HOSPITAL_COMMUNITY): Payer: Self-pay

## 2012-12-04 DIAGNOSIS — I214 Non-ST elevation (NSTEMI) myocardial infarction: Secondary | ICD-10-CM | POA: Insufficient documentation

## 2012-12-04 DIAGNOSIS — I10 Essential (primary) hypertension: Secondary | ICD-10-CM | POA: Insufficient documentation

## 2012-12-04 DIAGNOSIS — I2510 Atherosclerotic heart disease of native coronary artery without angina pectoris: Secondary | ICD-10-CM | POA: Insufficient documentation

## 2012-12-04 DIAGNOSIS — Z5189 Encounter for other specified aftercare: Secondary | ICD-10-CM | POA: Insufficient documentation

## 2012-12-04 DIAGNOSIS — E785 Hyperlipidemia, unspecified: Secondary | ICD-10-CM | POA: Insufficient documentation

## 2012-12-04 DIAGNOSIS — G4733 Obstructive sleep apnea (adult) (pediatric): Secondary | ICD-10-CM | POA: Insufficient documentation

## 2012-12-04 DIAGNOSIS — Z8249 Family history of ischemic heart disease and other diseases of the circulatory system: Secondary | ICD-10-CM | POA: Insufficient documentation

## 2012-12-04 DIAGNOSIS — I4891 Unspecified atrial fibrillation: Secondary | ICD-10-CM | POA: Insufficient documentation

## 2012-12-04 NOTE — Progress Notes (Signed)
Pt graduated from cardiac rehab program today.  Medication list reconciled.  PHQ9 score-0  .  Pt has made significant lifestyle changes and should be commended for her success. Pt plans to continue exercise on her own at the gym.

## 2012-12-06 ENCOUNTER — Encounter (HOSPITAL_COMMUNITY): Payer: Medicare Other

## 2012-12-09 ENCOUNTER — Encounter (HOSPITAL_COMMUNITY): Payer: Medicare Other

## 2012-12-17 ENCOUNTER — Other Ambulatory Visit: Payer: Medicare Other

## 2012-12-31 ENCOUNTER — Ambulatory Visit (INDEPENDENT_AMBULATORY_CARE_PROVIDER_SITE_OTHER): Payer: Medicare Other

## 2012-12-31 DIAGNOSIS — I2510 Atherosclerotic heart disease of native coronary artery without angina pectoris: Secondary | ICD-10-CM

## 2012-12-31 DIAGNOSIS — I4891 Unspecified atrial fibrillation: Secondary | ICD-10-CM

## 2012-12-31 DIAGNOSIS — E785 Hyperlipidemia, unspecified: Secondary | ICD-10-CM

## 2012-12-31 LAB — LIPID PANEL
Cholesterol: 251 mg/dL — ABNORMAL HIGH (ref 0–200)
HDL: 48.2 mg/dL (ref >39.00)
Total CHOL/HDL Ratio: 5
Triglycerides: 123 mg/dL (ref 0.0–149.0)
VLDL: 24.6 mg/dL (ref 0.0–40.0)

## 2012-12-31 LAB — BASIC METABOLIC PANEL
BUN: 14 mg/dL (ref 6–23)
Chloride: 104 mEq/L (ref 96–112)
GFR: 69.13 mL/min (ref >60.00)
Potassium: 3.7 mEq/L (ref 3.5–5.1)
Sodium: 138 mEq/L (ref 135–145)

## 2012-12-31 LAB — HEPATIC FUNCTION PANEL
Albumin: 4 g/dL (ref 3.5–5.2)
Alkaline Phosphatase: 58 U/L (ref 39–117)
Total Protein: 6.8 g/dL (ref 6.0–8.3)

## 2013-01-01 LAB — LDL CHOLESTEROL, DIRECT: Direct LDL: 174 mg/dL

## 2013-01-06 ENCOUNTER — Telehealth: Payer: Self-pay | Admitting: *Deleted

## 2013-01-06 NOTE — Telephone Encounter (Signed)
C/o brilinta making her bruise excessively. Wonders if you would recommend her to take anything else? She is aware you are out of office this week and will make recommendation next week, pt agreed to plan.

## 2013-01-11 NOTE — Telephone Encounter (Signed)
The brilinta is probably the best medication for her.  If the bruising is tolerable, would continue .  Any substitution that would cause less bruising would also not protect her as much from stent thrombosis.

## 2013-01-13 ENCOUNTER — Other Ambulatory Visit: Payer: Self-pay | Admitting: Cardiovascular Disease

## 2013-01-13 NOTE — Telephone Encounter (Signed)
msg with informations left per Dr Harvie Bridge advise. Told her to cal if she wants to make med changes. Number provided

## 2013-04-07 ENCOUNTER — Encounter: Payer: Self-pay | Admitting: *Deleted

## 2013-04-07 ENCOUNTER — Telehealth: Payer: Self-pay | Admitting: Cardiovascular Disease

## 2013-04-07 NOTE — Telephone Encounter (Signed)
08/14/12 HEART CATH, The LAD lesion was then stented with a 3.0 x 20 mm Promus premier stent. At that time it was recommended the pt remain on Brilinta for 1 year. This will be faxed as requested by the pt to [employer] [facility] [phone].

## 2013-04-07 NOTE — Telephone Encounter (Signed)
New Problem:  Pt states she is scheduled for an MRI at [facility]. Pt states they were asking information about her two stints and she lost her card so she could not share any information with them about her stints. Pt states she would like Jodette to call her or Delbert Harness with information about her stints. Please advise

## 2013-04-10 ENCOUNTER — Other Ambulatory Visit: Payer: Self-pay

## 2013-04-15 ENCOUNTER — Telehealth: Payer: Self-pay | Admitting: Cardiovascular Disease

## 2013-04-15 NOTE — Telephone Encounter (Signed)
PT CALLED / PAPER WAS FAXED. COPY MADE AND PLACED AT DESK FOR PICK UP, PT AWARE.

## 2013-04-15 NOTE — Telephone Encounter (Signed)
New message    Nurse was supposed to fax to [facility] info on her stent so that she can have a MRI. [facility] did not get stent info for MRI.  Pt had to cancel appt.  Husband will pick up stent info late this pm.  Pt cannot find her stent card.  MRI has been rescheduled for this week.

## 2013-04-17 ENCOUNTER — Other Ambulatory Visit: Payer: Self-pay | Admitting: Physician Assistant

## 2013-06-30 ENCOUNTER — Other Ambulatory Visit: Payer: Self-pay | Admitting: *Deleted

## 2013-06-30 MED ORDER — PROPAFENONE HCL 150 MG PO TABS: ORAL_TABLET | ORAL | Status: DC

## 2013-06-30 MED ORDER — ATENOLOL 25 MG PO TABS: ORAL_TABLET | ORAL | Status: DC

## 2013-07-17 ENCOUNTER — Encounter: Payer: Self-pay | Admitting: Cardiovascular Disease

## 2013-07-28 ENCOUNTER — Ambulatory Visit: Payer: Medicare Other | Admitting: Cardiovascular Disease

## 2013-07-28 ENCOUNTER — Observation Stay (HOSPITAL_COMMUNITY)
Admission: EM | Admit: 2013-07-28 | Discharge: 2013-07-29 | Disposition: A | Discharge status: Home / Self Care | Payer: Medicare Other | Attending: Cardiovascular Disease | Admitting: Cardiovascular Disease

## 2013-07-28 ENCOUNTER — Emergency Department (HOSPITAL_COMMUNITY): Payer: Medicare Other

## 2013-07-28 ENCOUNTER — Encounter (HOSPITAL_COMMUNITY): Payer: Self-pay | Admitting: Emergency Medicine

## 2013-07-28 DIAGNOSIS — I10 Essential (primary) hypertension: Secondary | ICD-10-CM

## 2013-07-28 DIAGNOSIS — Z860100 Personal history of colon polyps, unspecified: Secondary | ICD-10-CM | POA: Insufficient documentation

## 2013-07-28 DIAGNOSIS — R079 Chest pain, unspecified: Secondary | ICD-10-CM

## 2013-07-28 DIAGNOSIS — Z7982 Long term (current) use of aspirin: Secondary | ICD-10-CM | POA: Diagnosis not present

## 2013-07-28 DIAGNOSIS — G4733 Obstructive sleep apnea (adult) (pediatric): Secondary | ICD-10-CM | POA: Diagnosis present

## 2013-07-28 DIAGNOSIS — M129 Arthropathy, unspecified: Secondary | ICD-10-CM | POA: Diagnosis not present

## 2013-07-28 DIAGNOSIS — Z9861 Coronary angioplasty status: Secondary | ICD-10-CM | POA: Diagnosis not present

## 2013-07-28 DIAGNOSIS — I4891 Unspecified atrial fibrillation: Secondary | ICD-10-CM | POA: Diagnosis not present

## 2013-07-28 DIAGNOSIS — K7689 Other specified diseases of liver: Secondary | ICD-10-CM | POA: Insufficient documentation

## 2013-07-28 DIAGNOSIS — I2510 Atherosclerotic heart disease of native coronary artery without angina pectoris: Secondary | ICD-10-CM | POA: Diagnosis not present

## 2013-07-28 DIAGNOSIS — K219 Gastro-esophageal reflux disease without esophagitis: Secondary | ICD-10-CM | POA: Diagnosis not present

## 2013-07-28 DIAGNOSIS — M810 Age-related osteoporosis without current pathological fracture: Secondary | ICD-10-CM | POA: Insufficient documentation

## 2013-07-28 DIAGNOSIS — I200 Unstable angina: Secondary | ICD-10-CM

## 2013-07-28 DIAGNOSIS — Z79899 Other long term (current) drug therapy: Secondary | ICD-10-CM | POA: Insufficient documentation

## 2013-07-28 DIAGNOSIS — R911 Solitary pulmonary nodule: Secondary | ICD-10-CM | POA: Insufficient documentation

## 2013-07-28 DIAGNOSIS — E785 Hyperlipidemia, unspecified: Secondary | ICD-10-CM | POA: Diagnosis not present

## 2013-07-28 DIAGNOSIS — R0789 Other chest pain: Secondary | ICD-10-CM | POA: Diagnosis not present

## 2013-07-28 DIAGNOSIS — I480 Paroxysmal atrial fibrillation: Secondary | ICD-10-CM | POA: Diagnosis present

## 2013-07-28 LAB — BASIC METABOLIC PANEL
BUN: 14 mg/dL (ref 6–23)
CO2: 25 meq/L (ref 19–32)
CREATININE: 0.88 mg/dL (ref 0.50–1.10)
Calcium: 9.4 mg/dL (ref 8.4–10.5)
Chloride: 100 mEq/L (ref 96–112)
GFR calc Af Amer: 77 mL/min — ABNORMAL LOW (ref >90)
GFR calc non Af Amer: 66 mL/min — ABNORMAL LOW (ref >90)
Glucose, Bld: 97 mg/dL (ref 70–99)
Potassium: 4.1 mEq/L (ref 3.7–5.3)
Sodium: 141 mEq/L (ref 137–147)

## 2013-07-28 LAB — CBC
HCT: 43.5 % (ref 36.0–46.0)
Hemoglobin: 15 g/dL (ref 12.0–15.0)
MCH: 31.8 pg (ref 26.0–34.0)
MCHC: 34.5 g/dL (ref 30.0–36.0)
MCV: 92.4 fL (ref 78.0–100.0)
Platelets: 194 10*3/uL (ref 150–400)
RBC: 4.71 MIL/uL (ref 3.87–5.11)
RDW: 13.4 % (ref 11.5–15.5)
WBC: 6.2 10*3/uL (ref 4.0–10.5)

## 2013-07-28 LAB — I-STAT TROPONIN, ED: Troponin i, poc: 0 ng/mL (ref 0.00–0.08)

## 2013-07-28 LAB — TROPONIN I: Troponin I: <0.3 ng/mL (ref <0.30)

## 2013-07-28 LAB — PROTIME-INR
INR: 0.98 (ref 0.00–1.49)
PROTHROMBIN TIME: 12.8 s (ref 11.6–15.2)

## 2013-07-28 MED ORDER — PROPAFENONE HCL 150 MG PO TABS
150.0000 mg | ORAL_TABLET | Freq: Two times a day (BID) | ORAL | Status: DC
  Administered 2013-07-28 (×2): 150 mg via ORAL
  Filled 2013-07-28 (×4): qty 1

## 2013-07-28 MED ORDER — PANTOPRAZOLE SODIUM 40 MG PO TBEC
40.0000 mg | DELAYED_RELEASE_TABLET | Freq: Every day | ORAL | Status: DC
  Administered 2013-07-28: 40 mg via ORAL

## 2013-07-28 MED ORDER — INFLUENZA VAC SPLIT QUAD 0.5 ML IM SUSP
0.5000 mL | INTRAMUSCULAR | Status: DC
  Filled 2013-07-28: qty 0.5

## 2013-07-28 MED ORDER — AMLODIPINE BESYLATE 2.5 MG PO TABS
2.5000 mg | ORAL_TABLET | Freq: Every day | ORAL | Status: DC
  Administered 2013-07-28: 2.5 mg via ORAL
  Filled 2013-07-28 (×2): qty 1

## 2013-07-28 MED ORDER — ASPIRIN EC 81 MG PO TBEC
81.0000 mg | DELAYED_RELEASE_TABLET | Freq: Every day | ORAL | Status: DC
  Filled 2013-07-28 (×2): qty 1

## 2013-07-28 MED ORDER — ASPIRIN EC 81 MG PO TBEC: 81.0000 mg | DELAYED_RELEASE_TABLET | Freq: Every day | ORAL | Status: DC

## 2013-07-28 MED ORDER — ATENOLOL 25 MG PO TABS
25.0000 mg | ORAL_TABLET | Freq: Every day | ORAL | Status: DC
  Administered 2013-07-28: 25 mg via ORAL
  Filled 2013-07-28 (×2): qty 1

## 2013-07-28 MED ORDER — ATENOLOL 25 MG PO TABS
25.0000 mg | ORAL_TABLET | Freq: Every day | ORAL | Status: DC
  Filled 2013-07-28: qty 1

## 2013-07-28 MED ORDER — SODIUM CHLORIDE 0.9 % IV SOLN
Freq: Once | INTRAVENOUS | Status: AC
  Administered 2013-07-28: 500 mL via INTRAVENOUS

## 2013-07-28 MED ORDER — ASPIRIN 81 MG PO CHEW
324.0000 mg | CHEWABLE_TABLET | Freq: Once | ORAL | Status: AC
  Administered 2013-07-28: 324 mg via ORAL
  Filled 2013-07-28: qty 4

## 2013-07-28 MED ORDER — MORPHINE SULFATE 4 MG/ML IJ SOLN
2.0000 mg | Freq: Once | INTRAMUSCULAR | Status: AC
  Administered 2013-07-28: 2 mg via INTRAVENOUS
  Filled 2013-07-28: qty 1

## 2013-07-28 MED ORDER — TICAGRELOR 90 MG PO TABS
90.0000 mg | ORAL_TABLET | Freq: Two times a day (BID) | ORAL | Status: DC
  Administered 2013-07-28 (×2): 90 mg via ORAL
  Filled 2013-07-28 (×4): qty 1

## 2013-07-28 MED ORDER — TEMAZEPAM 15 MG PO CAPS: 15.0000 mg | ORAL_CAPSULE | Freq: Every evening | ORAL | Status: DC | PRN

## 2013-07-28 MED ORDER — NITROGLYCERIN 0.4 MG SL SUBL
0.4000 mg | SUBLINGUAL_TABLET | SUBLINGUAL | Status: DC | PRN
  Administered 2013-07-28 (×2): 0.4 mg via SUBLINGUAL

## 2013-07-28 NOTE — ED Notes (Signed)
Portable cxr completed.  Dr Sabra Heck in room examining pt.

## 2013-07-28 NOTE — ED Notes (Signed)
Pt began experiencing less sharpness of chest pain after administering one SL ntg, but due to pt gasping at the pain in her chest just left of sternum, pt given morphine to aid in pain control.

## 2013-07-28 NOTE — ED Notes (Signed)
Pain has subsided with second nitro.  Pt more comfortable.

## 2013-07-28 NOTE — H&P (Signed)
Patient ID: Shannon Cummings MRN: [id number] DOB/AGE: 67-Sep-1947 67 y.o. Admit date: 07/28/2013  Primary Cardiologist: Nahser  HPI: 68 yo female with history of CAD, HTN, HLD, OSA, PAF, GERD presenting to ED with c/o chest pain. She has known CAD with cardiac cath 08/14/12 at which time she was found to have severe restenosis in the proximal LAD stented segment, treated with a 3.0 x 20 Promus drug eluting stent. Repeat cath 08/19/12 with patent stent. She has done well since then. She had onset of chest pain last night while watching TV. The pain is sharp in nature, has been coming and going for last 10 hours. No associated dyspnea but she did have left arm and jaw numbness for 15 minutes last night. She is now pain free in the ED. EKG without ischemic changes this am. Troponin is negative.   Review of systems complete and found to be negative unless listed above   Past Medical History  Diagnosis Date  . OSA on CPAP   . Hyperlipidemia   . Hypertension   . PAF (paroxysmal atrial fibrillation)     On rythmol.  . Tubular adenoma of colon 2007  . CAD (coronary artery disease)     a. prior stenting history. b. NSTEMI s/p DES to prox LAD with EF 45% by cath 08/14/12. c. Mild trop elevation shortly after cath ?mild plaque embolization - patent stent on relook.  . Osteoporosis   . Fatty infiltration of liver   . GERD (gastroesophageal reflux disease)   . Arthritis   . Pulmonary nodule     a. 4mm subpleural nodular density CT 08/2012, instructed to f/u pulm MD.    Family History  Problem Relation Age of Onset  . Breast cancer Maternal Aunt   . Heart disease Father   . Alzheimer's disease Mother   . Colon cancer Neg Hx     History   Social History  . Marital Status: Married    Spouse Name: N/A    Number of Children: 2  . Years of Education: N/A   Occupational History  . Retired     works part time in Customer service manager   Social History Main Topics  . Smoking status: Never Smoker     . Smokeless tobacco: Never Used  . Alcohol Use: Yes     Comment: 2 drinks daily   . Drug Use: No  . Sexual Activity: Not Currently   Other Topics Concern  . Not on file   Social History Narrative   Daily caffeine     Past Surgical History  Procedure Laterality Date  . Total abdominal hysterectomy    . Tonsillectomy    . Coronary stent placement  2000  . Coronary angioplasty with stent placement  08/14/2012    LAD  Dr [name]  . Rotator cuff repair      Allergies  Allergen Reactions  . Statins Other (See Comments)    Restless legs, bad feeling    Prior to Admission Meds:  Current Facility-Administered Medications  Medication Dose Route Frequency Provider Last Rate Last Dose  . nitroGLYCERIN (NITROSTAT) SL tablet 0.4 mg  0.4 mg Sublingual Q5 Min x 3 PRN Johnna Acosta, MD   0.4 mg at 07/28/13 3532   Current Outpatient Prescriptions  Medication Sig Dispense Refill  . AMBULATORY NON FORMULARY MEDICATION CPAP Machine USES AS DIRECTED      . amLODipine (NORVASC) 5 MG tablet Take 2.5 mg by mouth at bedtime.       Marland Kitchen  aspirin 81 MG tablet Take 1 tablet (81 mg total) by mouth daily.      Marland Kitchen atenolol (TENORMIN) 25 MG tablet TAKE 1 TABLET BY MOUTH EVERY DAY  90 tablet  0  . cholecalciferol (VITAMIN D) 1000 UNITS tablet Take 1,000 Units by mouth daily.      . nitroGLYCERIN (NITROSTAT) 0.4 MG SL tablet Place 1 tablet (0.4 mg total) under the tongue every 5 (five) minutes as needed for chest pain (up to 3 doses).  25 tablet  4  . omeprazole (PRILOSEC) 20 MG capsule Take 20 mg by mouth daily.      . propafenone (RYTHMOL) 150 MG tablet Take 150 mg by mouth 2 (two) times daily.      . temazepam (RESTORIL) 15 MG capsule Take 15 mg by mouth at bedtime as needed for sleep.      . Ticagrelor (BRILINTA) 90 MG TABS tablet Take 1 tablet (90 mg total) by mouth 2 (two) times daily.  60 tablet  10   Physical Exam: Blood pressure 120/69, pulse 43, temperature 97.7 F (36.5 C), temperature  source Oral, resp. rate 11, height 5\' 5"  (1.651 m), weight 134 lb (60.782 kg), SpO2 100.00%.   General: Well developed, well nourished, NAD  HEENT: OP clear, mucus membranes moist  SKIN: warm, dry. No rashes.  Neuro: No focal deficits  Musculoskeletal: Muscle strength 5/5 all ext  Psychiatric: Mood and affect normal  Neck: No JVD, no carotid bruits, no thyromegaly, no lymphadenopathy.  Lungs:Clear bilaterally, no wheezes, rhonci, crackles  Cardiovascular: Regular rate and rhythm. No murmurs, gallops or rubs.  Abdomen:Soft. Bowel sounds present. Non-tender.  Extremities: No lower extremity edema. Pulses are 2 + in the bilateral DP/PT.   Labs:   Lab Results  Component Value Date   WBC 6.2 07/28/2013   HGB 15.0 07/28/2013   HCT 43.5 07/28/2013   MCV 92.4 07/28/2013   PLT 194 07/28/2013    Recent Labs Lab 07/28/13 0455  NA 141  K 4.1  CL 100  CO2 25  BUN 14  CREATININE 0.88  CALCIUM 9.4  GLUCOSE 97   Cardiac cath 08/19/12: Left mainstem: Widely patent  Left anterior descending (LAD): Prox LAD stent is widely patent without restenosis. Mid/distal LAD is patent without obstructive disease.  Left circumflex (LCx): Large vessel, widely patent throughout. No significant change from previous study.  Right coronary artery (RCA): Moderate caliber, dominant vessel. No obstructive disease noted.  Radiology: Chest x-ray: No acute process  EKG: Sinus brady, no ischemic changes  ASSESSMENT AND PLAN:   1. CAD/Unstable angina: Pt has known CAD with DES placed in proximal LAD in March 2014, minimal disease in other vessels. Now admitted with chest pain that is concerning for unstable angina. Her initial troponin is negative. We have discussed cardiac cath later today but she does not want to consider cardiac cath today unless her pain returns. I will admit to telemetry and follow serial cardiac markers today. EKG in am. Continue home meds including Brilinta. If cardiac markers are negative and  she is pain free in am, could consider outpatient stress myoview. If recurrent chest pain, will plan cardiac cath tomorrow. No need for IV heparin or IV NTG now as she is pain free and troponin negative.   2. HTN: BP controlled.    Darlina Guys, MD 07/28/2013, 7:56 AM

## 2013-07-28 NOTE — ED Notes (Signed)
Pt here for chest pain, onset at 315 this morning,not feeling well last pm, denies sob, and vomiting or nausea, reports intermittent head pains lasting approx 1 min on and off for approx 1 month now accompanied with dizziness. Took ntg at home and pain now 5/10

## 2013-07-28 NOTE — ED Provider Notes (Signed)
CSN: [id number]     Arrival date & time 07/28/13  [id number] History   First MD Initiated Contact with Patient 07/28/13 [phone]     Chief Complaint  Patient presents with  . Chest Pain     (Consider location/radiation/quality/duration/timing/severity/associated sxs/prior Treatment) HPI Comments: Pt is a 68 y/o female with hx of Htn, Hypercholesterolemia and prior MI with stenting in the past who presents with L sided CP - onset 2 hours prior to arrival - is sharp, severe, waxing and waning and was associated with a numb feeling in the L arm, jaw and head.  Nothing makes better or worse including no sob, no deep breathing, no exertional sx.  She has been walking on the treadmill several times a week without difficulty or DOE / CP.  She has been taking her brilinta as prescribed.  Had f/u with Dr. Acie Fredrickson scheduled for today  Last and was placed in March of 2014, she had a nonstemi shortly after that, recath showed no obstrcution adn patent stent to LAD  Patient is a 68 y.o. female presenting with chest pain. The history is provided by the patient and the spouse.  Chest Pain   Past Medical History  Diagnosis Date  . OSA on CPAP   . Hyperlipidemia   . Hypertension   . PAF (paroxysmal atrial fibrillation)     On rythmol.  . Tubular adenoma of colon 2007  . CAD (coronary artery disease)     a. prior stenting history. b. NSTEMI s/p DES to prox LAD with EF 45% by cath 08/14/12. c. Mild trop elevation shortly after cath ?mild plaque embolization - patent stent on relook.  . Osteoporosis   . Fatty infiltration of liver   . GERD (gastroesophageal reflux disease)   . Arthritis   . Pulmonary nodule     a. 32mm subpleural nodular density CT 08/2012, instructed to f/u pulm MD.   Past Surgical History  Procedure Laterality Date  . Total abdominal hysterectomy    . Tonsillectomy    . Coronary stent placement  2000  . Coronary angioplasty with stent placement  08/14/2012    LAD  Dr [name]  .  Rotator cuff repair     Family History  Problem Relation Age of Onset  . Breast cancer Maternal Aunt   . Heart disease Father   . Alzheimer's disease Mother   . Colon cancer Neg Hx    History  Substance Use Topics  . Smoking status: Never Smoker   . Smokeless tobacco: Never Used  . Alcohol Use: Yes     Comment: 2 drinks daily    OB History   Grav Para Term Preterm Abortions TAB SAB Ect Mult Living                 Review of Systems  Cardiovascular: Positive for chest pain.  All other systems reviewed and are negative.      Allergies  Statins  Home Medications   Current Outpatient Rx  Name  Route  Sig  Dispense  Refill  . AMBULATORY NON FORMULARY MEDICATION      CPAP Machine USES AS DIRECTED         . amLODipine (NORVASC) 5 MG tablet   Oral   Take 2.5 mg by mouth at bedtime.          Marland Kitchen aspirin 81 MG tablet   Oral   Take 1 tablet (81 mg total) by mouth daily.         Marland Kitchen  atenolol (TENORMIN) 25 MG tablet      TAKE 1 TABLET BY MOUTH EVERY DAY   90 tablet   0   . cholecalciferol (VITAMIN D) 1000 UNITS tablet   Oral   Take 1,000 Units by mouth daily.         . nitroGLYCERIN (NITROSTAT) 0.4 MG SL tablet   Sublingual   Place 1 tablet (0.4 mg total) under the tongue every 5 (five) minutes as needed for chest pain (up to 3 doses).   25 tablet   4   . omeprazole (PRILOSEC) 20 MG capsule   Oral   Take 20 mg by mouth daily.         . propafenone (RYTHMOL) 150 MG tablet   Oral   Take 150 mg by mouth 2 (two) times daily.         . Ticagrelor (BRILINTA) 90 MG TABS tablet   Oral   Take 1 tablet (90 mg total) by mouth 2 (two) times daily.   60 tablet   10    BP 128/64  Pulse 51  Temp(Src) 97.7 F (36.5 C) (Oral)  Resp 13  Ht 5\' 5"  (1.651 m)  Wt 134 lb (60.782 kg)  BMI 22.30 kg/m2  SpO2 97% Physical Exam  Nursing note and vitals reviewed. Constitutional: She appears well-developed and well-nourished. No distress.  HENT:  Head:  Normocephalic and atraumatic.  Mouth/Throat: Oropharynx is clear and moist. No oropharyngeal exudate.  Eyes: Conjunctivae and EOM are normal. Pupils are equal, round, and reactive to light. Right eye exhibits no discharge. Left eye exhibits no discharge. No scleral icterus.  Neck: Normal range of motion. Neck supple. No JVD present. No thyromegaly present.  Cardiovascular: Regular rhythm, normal heart sounds and intact distal pulses.  Exam reveals no gallop and no friction rub.   No murmur heard. Sbrady, normal pulses  Pulmonary/Chest: Effort normal and breath sounds normal. No respiratory distress. She has no wheezes. She has no rales.  Abdominal: Soft. Bowel sounds are normal. She exhibits no distension and no mass. There is no tenderness.  Musculoskeletal: Normal range of motion. She exhibits no edema and no tenderness.  Lymphadenopathy:    She has no cervical adenopathy.  Neurological: She is alert. Coordination normal.  Skin: Skin is warm and dry. No rash noted. No erythema.  Psychiatric: She has a normal mood and affect. Her behavior is normal.    ED Course  Procedures (including critical care time) Labs Review Labs Reviewed  BASIC METABOLIC PANEL - Abnormal; Notable for the following:    GFR calc non Af Amer 66 (*)    GFR calc Af Amer 77 (*)    All other components within normal limits  CBC  I-STAT TROPOININ, ED   Imaging Review Dg Chest Port 1 View  07/28/2013   CLINICAL DATA:  Chest pain.  EXAM: PORTABLE CHEST - 1 VIEW  COMPARISON:  DG CHEST 2 VIEW dated 08/16/2012  FINDINGS: Cardiomediastinal silhouette is unremarkable. The lungs are clear without pleural effusions or focal consolidations. Trachea projects midline and there is no pneumothorax. Soft tissue planes and included osseous structures are non-suspicious. Multiple EKG lines overlie the patient and may obscure subtle underlying pathology.  IMPRESSION: No acute cardiopulmonary process.   Electronically Signed   By:  Elon Alas   On: 07/28/2013 05:11    EKG Interpretation    Date/Time:  Monday July 28 2013 04:21:39 EST Ventricular Rate:  53 PR Interval:  192 QRS Duration: 84 QT  Interval:  448 QTC Calculation: 420 R Axis:   39 Text Interpretation:  Sinus bradycardia Otherwise normal ECG T wave abnormality no longer present in precordial leads Confirmed by Nicholas Ossa  MD, Johnedward Brodrick ([id number]) on 07/28/2013 4:58:31 AM            MDM   Final diagnoses:  Chest pain    The pt has a non ischemic appearing ECG - she has normal VS other than mild Sinus Bradycardia dn mild htn.  Will need r/o for Mi - states sx are very similar to past.  ASA and nitro ordered, morphine, O2  The patient is now chest pain-free after receiving morphine, aspirin and sublingual nitroglycerin. I discussed her care with the cardiologist who will come see the patient. Troponin is normal  Johnna Acosta, MD 07/28/13 [phone]

## 2013-07-28 NOTE — ED Notes (Signed)
Report given to [place], RN pt will be moved to [place]

## 2013-07-29 DIAGNOSIS — I10 Essential (primary) hypertension: Secondary | ICD-10-CM | POA: Diagnosis not present

## 2013-07-29 DIAGNOSIS — I4891 Unspecified atrial fibrillation: Secondary | ICD-10-CM | POA: Diagnosis not present

## 2013-07-29 DIAGNOSIS — I2510 Atherosclerotic heart disease of native coronary artery without angina pectoris: Secondary | ICD-10-CM | POA: Diagnosis not present

## 2013-07-29 DIAGNOSIS — R0789 Other chest pain: Secondary | ICD-10-CM | POA: Diagnosis not present

## 2013-07-29 DIAGNOSIS — R079 Chest pain, unspecified: Secondary | ICD-10-CM | POA: Diagnosis not present

## 2013-07-29 DIAGNOSIS — E785 Hyperlipidemia, unspecified: Secondary | ICD-10-CM | POA: Diagnosis not present

## 2013-07-29 LAB — BASIC METABOLIC PANEL
BUN: 15 mg/dL (ref 6–23)
CHLORIDE: 103 meq/L (ref 96–112)
CO2: 26 mEq/L (ref 19–32)
Calcium: 8.7 mg/dL (ref 8.4–10.5)
Creatinine, Ser: 0.94 mg/dL (ref 0.50–1.10)
GFR, EST AFRICAN AMERICAN: 71 mL/min — AB (ref >90)
GFR, EST NON AFRICAN AMERICAN: 61 mL/min — AB (ref >90)
Glucose, Bld: 90 mg/dL (ref 70–99)
Potassium: 4.2 mEq/L (ref 3.7–5.3)
SODIUM: 142 meq/L (ref 137–147)

## 2013-07-29 LAB — LIPID PANEL
CHOL/HDL RATIO: 5 ratio
Cholesterol: 245 mg/dL — ABNORMAL HIGH (ref 0–200)
HDL: 49 mg/dL (ref >39)
LDL Cholesterol: 166 mg/dL — ABNORMAL HIGH (ref 0–99)
TRIGLYCERIDES: 152 mg/dL — AB (ref <150)
VLDL: 30 mg/dL (ref 0–40)

## 2013-07-29 LAB — CBC
HCT: 39.8 % (ref 36.0–46.0)
Hemoglobin: 13.3 g/dL (ref 12.0–15.0)
MCH: 31.2 pg (ref 26.0–34.0)
MCHC: 33.4 g/dL (ref 30.0–36.0)
MCV: 93.4 fL (ref 78.0–100.0)
Platelets: 160 10*3/uL (ref 150–400)
RBC: 4.26 MIL/uL (ref 3.87–5.11)
RDW: 13.8 % (ref 11.5–15.5)
WBC: 5.3 10*3/uL (ref 4.0–10.5)

## 2013-07-29 LAB — TROPONIN I: Troponin I: <0.3 ng/mL (ref <0.30)

## 2013-07-29 NOTE — Discharge Instructions (Signed)
Chest Pain (Nonspecific) °It is often hard to give a specific diagnosis for the cause of chest pain. There is always a chance that your pain could be related to something serious, such as a heart attack or a blood clot in the lungs. You need to follow up with your caregiver for further evaluation. °CAUSES  °· Heartburn. °· Pneumonia or bronchitis. °· Anxiety or stress. °· Inflammation around your heart (pericarditis) or lung (pleuritis or pleurisy). °· A blood clot in the lung. °· A collapsed lung (pneumothorax). It can develop suddenly on its own (spontaneous pneumothorax) or from injury (trauma) to the chest. °· Shingles infection (herpes zoster virus). °The chest wall is composed of bones, muscles, and cartilage. Any of these can be the source of the pain. °· The bones can be bruised by injury. °· The muscles or cartilage can be strained by coughing or overwork. °· The cartilage can be affected by inflammation and become sore (costochondritis). °DIAGNOSIS  °Lab tests or other studies, such as X-rays, electrocardiography, stress testing, or cardiac imaging, may be needed to find the cause of your pain.  °TREATMENT  °· Treatment depends on what may be causing your chest pain. Treatment may include: °· Acid blockers for heartburn. °· Anti-inflammatory medicine. °· Pain medicine for inflammatory conditions. °· Antibiotics if an infection is present. °· You may be advised to change lifestyle habits. This includes stopping smoking and avoiding alcohol, caffeine, and chocolate. °· You may be advised to keep your head raised (elevated) when sleeping. This reduces the chance of acid going backward from your stomach into your esophagus. °· Most of the time, nonspecific chest pain will improve within 2 to 3 days with rest and mild pain medicine. °HOME CARE INSTRUCTIONS  °· If antibiotics were prescribed, take your antibiotics as directed. Finish them even if you start to feel better. °· For the next few days, avoid physical  activities that bring on chest pain. Continue physical activities as directed. °· Do not smoke. °· Avoid drinking alcohol. °· Only take over-the-counter or prescription medicine for pain, discomfort, or fever as directed by your caregiver. °· Follow your caregiver's suggestions for further testing if your chest pain does not go away. °· Keep any follow-up appointments you made. If you do not go to an appointment, you could develop lasting (chronic) problems with pain. If there is any problem keeping an appointment, you must call to reschedule. °SEEK MEDICAL CARE IF:  °· You think you are having problems from the medicine you are taking. Read your medicine instructions carefully. °· Your chest pain does not go away, even after treatment. °· You develop a rash with blisters on your chest. °SEEK IMMEDIATE MEDICAL CARE IF:  °· You have increased chest pain or pain that spreads to your arm, neck, jaw, back, or abdomen. °· You develop shortness of breath, an increasing cough, or you are coughing up blood. °· You have severe back or abdominal pain, feel nauseous, or vomit. °· You develop severe weakness, fainting, or chills. °· You have a fever. °THIS IS AN EMERGENCY. Do not wait to see if the pain will go away. Get medical help at once. Call your local emergency services (911 in [place] Do not drive yourself to the hospital. °MAKE SURE YOU:  °· Understand these instructions. °· Will watch your condition. °· Will get help right away if you are not doing well or get worse. °Document Released: 03/01/2005 Document Revised: 08/14/2011 Document Reviewed: 12/26/2007 °[employer]® Patient Information ©2014 [employer]   [employer]. ° °

## 2013-07-29 NOTE — Discharge Summary (Signed)
Patient ID: Shannon Cummings,  MRN: [id number], DOB/AGE: Oct 22, 1945 68 y.o.  Admit date: 07/28/2013 Discharge date: 07/29/2013  Primary Care Provider: Dr Florina Ou Primary Cardiologist: Dr Acie Fredrickson  Discharge Diagnoses Active Problems:   Unstable angina   Coronary artery disease- LAD DES 3/14   HYPERLIPIDEMIA   OBSTRUCTIVE SLEEP APNEA   HYPERTENSION   PAF - NSR on Rythmol    Hospital Course:  68 yo female with history of CAD, HTN, HLD, OSA, PAF, GERD presenting to ED with c/o chest pain. She has known CAD with cardiac cath 08/14/12 at which time she was found to have severe restenosis in the proximal LAD stented segment, treated with a 3.0 x 20 Promus drug eluting stent. Repeat cath 08/19/12 with patent stent. She has done well since then til the day of admission 07/28/13. She came to the ER with complaints of 10 hrs of sharp chest pain. She was admitted overnight and Troponin's were negative. She was seen by Dr Tamala Julian the next morning and felt to be stable for discharge with plans for an OP stress (GXT Myoview) test and then follow up with Dr Acie Fredrickson.    Discharge Vitals:  Blood pressure 101/71, pulse 52, temperature 97.8 F (36.6 C), temperature source Oral, resp. rate 18, height _0  (1.651 m), weight 134 lb 12.8 oz (61.145 kg), SpO2 96.00%.    Labs: Results for orders placed during the hospital encounter of 07/28/13 (from the past 48 hour(s))  CBC     Status: None   Collection Time    07/28/13  4:55 AM      Result Value Ref Range   WBC 6.2  4.0 - 10.5 K/uL   RBC 4.71  3.87 - 5.11 MIL/uL   Hemoglobin 15.0  12.0 - 15.0 g/dL   HCT 43.5  36.0 - 46.0 %   MCV 92.4  78.0 - 100.0 fL   MCH 31.8  26.0 - 34.0 pg   MCHC 34.5  30.0 - 36.0 g/dL   RDW 13.4  11.5 - 15.5 %   Platelets 194  150 - 400 K/uL  BASIC METABOLIC PANEL     Status: Abnormal   Collection Time    07/28/13  4:55 AM      Result Value Ref Range   Sodium 141  137 - 147 mEq/L   Potassium 4.1  3.7 - 5.3 mEq/L   Chloride 100   96 - 112 mEq/L   CO2 25  19 - 32 mEq/L   Glucose, Bld 97  70 - 99 mg/dL   BUN 14  6 - 23 mg/dL   Creatinine, Ser 0.88  0.50 - 1.10 mg/dL   Calcium 9.4  8.4 - 10.5 mg/dL   GFR calc non Af Amer 66 (*) >90 mL/min   GFR calc Af Amer 77 (*) >90 mL/min   Comment: (NOTE)     The eGFR has been calculated using the CKD EPI equation.     This calculation has not been validated in all clinical situations.     eGFR's persistently <90 mL/min signify possible Chronic Kidney     Disease.  Randolm Idol, ED     Status: None   Collection Time    07/28/13  4:59 AM      Result Value Ref Range   Troponin i, poc 0.00  0.00 - 0.08 ng/mL   Comment 3            Comment: Due to the release kinetics of cTnI,  a negative result within the first hours     of the onset of symptoms does not rule out     myocardial infarction with certainty.     If myocardial infarction is still suspected,     repeat the test at appropriate intervals.  TROPONIN I     Status: None   Collection Time    07/28/13 12:10 PM      Result Value Ref Range   Troponin I <0.30  <0.30 ng/mL   Comment:            Due to the release kinetics of cTnI,     a negative result within the first hours     of the onset of symptoms does not rule out     myocardial infarction with certainty.     If myocardial infarction is still suspected,     repeat the test at appropriate intervals.  PROTIME-INR     Status: None   Collection Time    07/28/13 12:10 PM      Result Value Ref Range   Prothrombin Time 12.8  11.6 - 15.2 seconds   INR 0.98  0.00 - 1.49  TROPONIN I     Status: None   Collection Time    07/28/13  4:40 PM      Result Value Ref Range   Troponin I <0.30  <0.30 ng/mL   Comment:            Due to the release kinetics of cTnI,     a negative result within the first hours     of the onset of symptoms does not rule out     myocardial infarction with certainty.     If myocardial infarction is still suspected,     repeat the  test at appropriate intervals.  TROPONIN I     Status: None   Collection Time    07/28/13 11:37 PM      Result Value Ref Range   Troponin I <0.30  <0.30 ng/mL   Comment:            Due to the release kinetics of cTnI,     a negative result within the first hours     of the onset of symptoms does not rule out     myocardial infarction with certainty.     If myocardial infarction is still suspected,     repeat the test at appropriate intervals.  CBC     Status: None   Collection Time    07/29/13  3:41 AM      Result Value Ref Range   WBC 5.3  4.0 - 10.5 K/uL   RBC 4.26  3.87 - 5.11 MIL/uL   Hemoglobin 13.3  12.0 - 15.0 g/dL   HCT 39.8  36.0 - 46.0 %   MCV 93.4  78.0 - 100.0 fL   MCH 31.2  26.0 - 34.0 pg   MCHC 33.4  30.0 - 36.0 g/dL   RDW 13.8  11.5 - 15.5 %   Platelets 160  150 - 400 K/uL  BASIC METABOLIC PANEL     Status: Abnormal   Collection Time    07/29/13  3:41 AM      Result Value Ref Range   Sodium 142  137 - 147 mEq/L   Potassium 4.2  3.7 - 5.3 mEq/L   Chloride 103  96 - 112 mEq/L   CO2 26  19 - 32 mEq/L   Glucose,  Bld 90  70 - 99 mg/dL   BUN 15  6 - 23 mg/dL   Creatinine, Ser 0.94  0.50 - 1.10 mg/dL   Calcium 8.7  8.4 - 10.5 mg/dL   GFR calc non Af Amer 61 (*) >90 mL/min   GFR calc Af Amer 71 (*) >90 mL/min   Comment: (NOTE)     The eGFR has been calculated using the CKD EPI equation.     This calculation has not been validated in all clinical situations.     eGFR's persistently <90 mL/min signify possible Chronic Kidney     Disease.  LIPID PANEL     Status: Abnormal   Collection Time    07/29/13  3:41 AM      Result Value Ref Range   Cholesterol 245 (*) 0 - 200 mg/dL   Triglycerides 152 (*) <150 mg/dL   HDL 49  >39 mg/dL   Total CHOL/HDL Ratio 5.0     VLDL 30  0 - 40 mg/dL   LDL Cholesterol 166 (*) 0 - 99 mg/dL   Comment:            Total Cholesterol/HDL:CHD Risk     Coronary Heart Disease Risk Table                         Men   Women      1/2  Average Risk   3.4   3.3      Average Risk       5.0   4.4      2 X Average Risk   9.6   7.1      3 X Average Risk  23.4   11.0                Use the calculated Patient Ratio     above and the CHD Risk Table     to determine the patient's CHD Risk.                ATP III CLASSIFICATION (LDL):      <100     mg/dL   Optimal      100-129  mg/dL   Near or Above                        Optimal      130-159  mg/dL   Borderline      160-189  mg/dL   High      >190     mg/dL   Very High    Disposition:      Follow-up Information   Follow up with Darden Amber., MD. (office will call you for stress test and follow up apt)    Specialty:  Cardiology   Contact information:   [address] [place] [address] [phone]       Discharge Medications:    Medication List         AMBULATORY NON FORMULARY MEDICATION  - CPAP Machine  - USES AS DIRECTED     amLODipine 5 MG tablet  Commonly known as:  NORVASC  Take 2.5 mg by mouth at bedtime.     aspirin 81 MG tablet  Take 1 tablet (81 mg total) by mouth daily.     atenolol 25 MG tablet  Commonly known as:  TENORMIN  TAKE 1 TABLET BY MOUTH EVERY DAY     cholecalciferol  1000 UNITS tablet  Commonly known as:  VITAMIN D  Take 1,000 Units by mouth daily.     nitroGLYCERIN 0.4 MG SL tablet  Commonly known as:  NITROSTAT  Place 1 tablet (0.4 mg total) under the tongue every 5 (five) minutes as needed for chest pain (up to 3 doses).     omeprazole 20 MG capsule  Commonly known as:  PRILOSEC  Take 20 mg by mouth daily.     propafenone 150 MG tablet  Commonly known as:  RYTHMOL  Take 150 mg by mouth 2 (two) times daily.     temazepam 15 MG capsule  Commonly known as:  RESTORIL  Take 15 mg by mouth at bedtime as needed for sleep.     Ticagrelor 90 MG Tabs tablet  Commonly known as:  BRILINTA  Take 1 tablet (90 mg total) by mouth 2 (two) times daily.         Duration of Discharge Encounter: Greater  than 30 minutes including physician time.  Angelena Form PA-C 07/29/2013 9:21 AM

## 2013-07-29 NOTE — Progress Notes (Signed)
MI ruled out. No recurrence of chest pain. ECG is normal. Plan discharge, assuming no chest pain with ambulation. Needs OP stress Nuclear and f/u with P. Nahser. No change in medical regimen.

## 2013-07-30 DIAGNOSIS — L570 Actinic keratosis: Secondary | ICD-10-CM | POA: Diagnosis not present

## 2013-07-30 NOTE — Discharge Summary (Signed)
Agree with the note and plans as outlined. 

## 2013-08-05 ENCOUNTER — Encounter: Payer: Self-pay | Admitting: Cardiovascular Disease

## 2013-08-20 ENCOUNTER — Encounter: Payer: Self-pay | Admitting: Cardiovascular Disease

## 2013-08-20 ENCOUNTER — Ambulatory Visit (INDEPENDENT_AMBULATORY_CARE_PROVIDER_SITE_OTHER): Payer: Medicare Other | Admitting: Cardiovascular Disease

## 2013-08-20 VITALS — BP 104/76 | HR 54 | Ht 65.0 in | Wt 135.1 lb

## 2013-08-20 DIAGNOSIS — I2510 Atherosclerotic heart disease of native coronary artery without angina pectoris: Secondary | ICD-10-CM | POA: Diagnosis not present

## 2013-08-20 DIAGNOSIS — I200 Unstable angina: Secondary | ICD-10-CM | POA: Diagnosis not present

## 2013-08-20 MED ORDER — PROPAFENONE HCL 150 MG PO TABS: 150.0000 mg | ORAL_TABLET | Freq: Two times a day (BID) | ORAL | Status: DC

## 2013-08-20 MED ORDER — NITROGLYCERIN 0.4 MG SL SUBL: 0.4000 mg | SUBLINGUAL_TABLET | SUBLINGUAL | Status: DC | PRN

## 2013-08-20 MED ORDER — AMLODIPINE BESYLATE 2.5 MG PO TABS: 2.5000 mg | ORAL_TABLET | Freq: Every day | ORAL | Status: DC

## 2013-08-20 MED ORDER — ATENOLOL 25 MG PO TABS: ORAL_TABLET | ORAL | Status: DC

## 2013-08-20 NOTE — Patient Instructions (Signed)
You have been referred to [facility] physician has recommended you make the following change in your medication: [place] wants you to follow-up in: [place] will receive a reminder letter in the mail two months in advance. If you don't receive a letter, please call our office to schedule the follow-up appointment.

## 2013-08-20 NOTE — Assessment & Plan Note (Addendum)
Shannon Cummings is doing quite a bit better. She was admitted to the hospital for chest pain but these pains have resolved.  She would really like to stop the Brilinta.   She's been having lots of bruising bruising. In addition, she needs to have ankle surgery and she'll need to stop for the ankle surgery anyway.  It's been over a year so she should be safe in discontinuing the Brilinta  We'll refill her other medications. We'll decrease the size of her Norvasc tablet because her blood pressure is doing well on the 2.5 mg a day - she is currently splitting a 5 mg tab.  She has a history of hyperlipidemia but is intolerant to statins. We will have her see our lipid clinic for consultation. I'll see her in 6 months.

## 2013-08-20 NOTE — Progress Notes (Signed)
Shannon Cummings Date of Birth  Apr 15, 1946 [facility] / [facility] [address]     [place], [place]  [address] [phone]  Fax  [phone]  Problem list: 1. Coronary artery disease-status post stenting in the past.  She status post stenting of her proximal LAD  08/14/2012 2. Intermittent atrial fibrillation Repair hyperlipidemia-she has been generally intolerant to most statins  History of Present Illness:  Pt is doing well.  No cardiac complaints.     She complains of some tingling and numbness associated with the Crestor.  She has discontinued the Crestor because of that reason.  She has had reactions to most statins that we have tried.  October 08-2011  She has done well.  She has not had any angina or eposides of atrial fib.  She has retired and is traveling quite a bit.  Her husband has also retired.  She has not had any problems.  October 01, 2012:  She is having some bruising ( due to [place]).  She is enjoying the cardiac rehab.  She has noticed some low BP readings at the end of cardiac rehab.  She does not feel bad.  These low readings are typically asymptomatic.    No angina.  No arrhythmias.   August 20, 2013:  Shannon Cummings is doing ok.  She was admitted to the hospital with some CP recently.  Rule out for MI.   She has been doing [place].  Has gained a bit of weight.   She is not able to exercise because  Of left ankle problems.    Current Outpatient Prescriptions on File Prior to Visit  Medication Sig Dispense Refill  . AMBULATORY NON FORMULARY MEDICATION CPAP Machine USES AS DIRECTED      . amLODipine (NORVASC) 5 MG tablet Take 2.5 mg by mouth at bedtime.       Marland Kitchen aspirin 81 MG tablet Take 1 tablet (81 mg total) by mouth daily.      Marland Kitchen atenolol (TENORMIN) 25 MG tablet TAKE 1 TABLET BY MOUTH EVERY DAY  90 tablet  0  . cholecalciferol (VITAMIN D) 1000 UNITS tablet Take 1,000 Units by mouth daily.      . nitroGLYCERIN (NITROSTAT) 0.4  MG SL tablet Place 1 tablet (0.4 mg total) under the tongue every 5 (five) minutes as needed for chest pain (up to 3 doses).  25 tablet  4  . omeprazole (PRILOSEC) 20 MG capsule Take 20 mg by mouth daily.      . propafenone (RYTHMOL) 150 MG tablet Take 150 mg by mouth 2 (two) times daily.      . temazepam (RESTORIL) 15 MG capsule Take 15 mg by mouth at bedtime as needed for sleep.       No current facility-administered medications on file prior to visit.    Allergies  Allergen Reactions  . Statins Other (See Comments)    Restless legs, bad feeling    Past Medical History  Diagnosis Date  . Hyperlipidemia   . Hypertension   . PAF (paroxysmal atrial fibrillation)     On rythmol.  . Tubular adenoma of colon 2007  . CAD (coronary artery disease)     a. prior stenting history. b. NSTEMI s/p DES to prox LAD with EF 45% by cath 08/14/12. c. Mild trop elevation shortly after cath ?mild plaque embolization - patent stent on relook.  . Osteoporosis   . Fatty infiltration of liver   . GERD (gastroesophageal reflux disease)   .  Arthritis   . Pulmonary nodule     a. 44mm subpleural nodular density CT 08/2012, instructed to f/u pulm MD.  . Anginal pain   . Myocardial infarction 2014  . OSA on CPAP     Past Surgical History  Procedure Laterality Date  . Total abdominal hysterectomy    . Tonsillectomy    . Coronary stent placement  2000  . Coronary angioplasty with stent placement  08/14/2012    LAD  Dr [name]  . Rotator cuff repair      History  Smoking status  . Never Smoker   Smokeless tobacco  . Never Used    History  Alcohol Use  . Yes    Comment: 2 drinks daily     Family History  Problem Relation Age of Onset  . Breast cancer Maternal Aunt   . Heart disease Father   . Alzheimer's disease Mother   . Colon cancer Neg Hx     Reviw of Systems:  Reviewed in the HPI.  All other systems are negative.  Physical Exam: BP 104/76  Pulse 54  Ht 5\' 5"  (1.651 m)   Wt 135 lb 1.9 oz (61.29 kg)  BMI 22.49 kg/m2 The patient is alert and oriented x 3.  The mood and affect are normal.   Skin: warm and dry.  Color is normal.    HEENT:   the sclera are nonicteric.  The mucous membranes are moist.  The carotids are 2+ without bruits.  There is no thyromegaly.  There is no JVD.    Lungs: clear.  The chest wall is non tender.    Heart: regular rate with a normal S1 and S2.  There are no murmurs, gallops, or rubs. The PMI is not displaced.     Abdomin: good bowel sounds.  There is no guarding or rebound.  There is no hepatosplenomegaly or tenderness.  There are no masses.   Extremities:  no clubbing, cyanosis, or edema.  The legs are without rashes.  The distal pulses are intact.   Neuro:  Cranial nerves II - XII are intact.  Motor and sensory functions are intact.    The gait is normal.  Assessment / Plan:

## 2013-08-26 ENCOUNTER — Ambulatory Visit (INDEPENDENT_AMBULATORY_CARE_PROVIDER_SITE_OTHER): Payer: Medicare Other | Admitting: Pharmacist

## 2013-08-26 VITALS — Wt 134.0 lb

## 2013-08-26 DIAGNOSIS — I200 Unstable angina: Secondary | ICD-10-CM

## 2013-08-26 DIAGNOSIS — E785 Hyperlipidemia, unspecified: Secondary | ICD-10-CM

## 2013-08-26 NOTE — Progress Notes (Signed)
Patient is a pleasant 68 y.o. WF referred to lipid clinic by Dr. Acie Fredrickson.  Patient has a h/o CAD (PCI in 2000, MI with PCI 08/2012) and inability to tolerate lipid lowering therapies.  She has failed multiple statins, including Crestor 5 mg once weekly, Lipitor 10 mg twice weekly, Simvastatin daily, and she thinks Livalo daily as well.  She is not interested in rechallenging statins.  She also failed Zetia in the past (last 2013).  She is not interested in [place] given its GI side effects, and given the LDL reduction that's needed, this wouldn't be adequate. Patient already eats a relatively low fat diet.  Patient is interested in enrolling into PCSK-9 inhibitor study.  RF:  CAD (had MI and PCI 08/2012), HTN, age, low HDL - LDL goal < 70, non-HDL goal < 100 Meds: Not on lipid lowering therapy currently. Intolerant:  Crestor 5 mg once weekly and also daily (muscle aches in legs), Lipitor 10 mg twice weekly and daily (muscle aches in legs), Simvastatin daily (muscle aches).  Zetia. She thinks she tried Livalo as well, but not positive.  Family history:  Strong family h/o of CAD on her father's side.  Father died at 42 of MI.  Her son has elevated LDL as well, but no heart disease. Social history:  Denies tobacco use.  Drinks 1-2 alcoholic drinks a few days of the week.  Labs:   08/2013:  TC 245, LDL 166, HDL 49, TG 152 [facility] - not on lipid lowering meds)  Current Outpatient Prescriptions  Medication Sig Dispense Refill  . AMBULATORY NON FORMULARY MEDICATION CPAP Machine USES AS DIRECTED      . amLODipine (NORVASC) 2.5 MG tablet Take 1 tablet (2.5 mg total) by mouth at bedtime.  90 tablet  1  . aspirin 81 MG tablet Take 1 tablet (81 mg total) by mouth daily.      Marland Kitchen atenolol (TENORMIN) 25 MG tablet TAKE 1 TABLET BY MOUTH EVERY DAY  90 tablet  1  . cholecalciferol (VITAMIN D) 1000 UNITS tablet Take 1,000 Units by mouth daily.      . nitroGLYCERIN (NITROSTAT) 0.4 MG SL tablet Place 1 tablet (0.4 mg  total) under the tongue every 5 (five) minutes as needed for chest pain (up to 3 doses).  25 tablet  6  . omeprazole (PRILOSEC) 20 MG capsule Take 20 mg by mouth daily.      . propafenone (RYTHMOL) 150 MG tablet Take 1 tablet (150 mg total) by mouth 2 (two) times daily.  180 tablet  1  . temazepam (RESTORIL) 15 MG capsule Take 15 mg by mouth at bedtime as needed for sleep.       No current facility-administered medications for this visit.   Allergies  Allergen Reactions  . Statins Other (See Comments)    Restless legs, bad feeling.  This has occurred with Crestor 5 mg once weekly, Lipitor 10 mg twice weekly, simvastatin qd  . Zetia [Ezetimibe]     Leg aches   Family History  Problem Relation Age of Onset  . Breast cancer Maternal Aunt   . Heart disease Father   . Alzheimer's disease Mother   . Colon cancer Neg Hx

## 2013-08-26 NOTE — Assessment & Plan Note (Signed)
Discussed treatment options with patient today, including Welchol, trying a different statins, or trying to enroll into PCSK-9 inhibitor study.  We discussed the study drug at length, and she is very interested in enrolling into this study.  She doesn't have a h/o cancer, has failed multiple statins, had PCI 1 year ago, so seems to meet the needed inclusion for study.  I will have the research foundation call patient and get her screened.  Patient is to call me if she starts having any problems, or decides not to enroll.  She tells me she is "very excited to have this opportunity".

## 2013-08-28 DIAGNOSIS — M19079 Primary osteoarthritis, unspecified ankle and foot: Secondary | ICD-10-CM | POA: Diagnosis not present

## 2013-09-03 ENCOUNTER — Telehealth: Payer: Self-pay | Admitting: Cardiovascular Disease

## 2013-09-11 ENCOUNTER — Telehealth: Payer: Self-pay | Admitting: Cardiovascular Disease

## 2013-09-11 NOTE — Telephone Encounter (Signed)
New message     Pt having a left ankle arthoscopy on Tuesday the 14th.  Can pt hold aspirin.  She is has been off of the aspirin a couple of days already.  OK to leave msg on vm.

## 2013-09-11 NOTE — Telephone Encounter (Signed)
Patient notified that PCSK-9 study will likely ramp up next week, and I passed her information to the research foundation again to contact her.

## 2013-09-11 NOTE — Telephone Encounter (Signed)
Pt is off brillinta since 08/2013// she has held her asa on her own, would you like her to continue to hold it?

## 2013-09-11 NOTE — Telephone Encounter (Signed)
New message   Patient calling wanted Shannon Cummings to call her back  . Stating she never heard from hospital

## 2013-09-12 NOTE — Telephone Encounter (Signed)
Sunday Spillers gone for day, Judson Roch notified.

## 2013-09-12 NOTE — Telephone Encounter (Signed)
I would have her continue asa if OK with ortho. Her last PCI was March 2014:

## 2013-09-12 NOTE — Telephone Encounter (Signed)
Follow up      Office calling back to address if patient need to continue or  discontinue ASA. Since patient stop on her own.     Can leave a message on voice mail.

## 2013-09-16 DIAGNOSIS — M12579 Traumatic arthropathy, unspecified ankle and foot: Secondary | ICD-10-CM | POA: Diagnosis not present

## 2013-09-16 DIAGNOSIS — M19079 Primary osteoarthritis, unspecified ankle and foot: Secondary | ICD-10-CM | POA: Diagnosis not present

## 2013-09-16 DIAGNOSIS — M659 Synovitis and tenosynovitis, unspecified: Secondary | ICD-10-CM | POA: Diagnosis not present

## 2013-09-16 DIAGNOSIS — G8918 Other acute postprocedural pain: Secondary | ICD-10-CM | POA: Diagnosis not present

## 2013-09-16 DIAGNOSIS — M24073 Loose body in unspecified ankle: Secondary | ICD-10-CM | POA: Diagnosis not present

## 2013-09-16 DIAGNOSIS — M2408 Loose body, other site: Secondary | ICD-10-CM | POA: Diagnosis not present

## 2013-11-21 ENCOUNTER — Encounter (HOSPITAL_COMMUNITY): Payer: Self-pay | Admitting: Emergency Medicine

## 2013-11-21 ENCOUNTER — Emergency Department (HOSPITAL_COMMUNITY)
Admission: EM | Admit: 2013-11-21 | Discharge: 2013-11-21 | Disposition: A | Discharge status: Home / Self Care | Payer: Medicare Other | Attending: Emergency Medicine | Admitting: Emergency Medicine

## 2013-11-21 DIAGNOSIS — Z79899 Other long term (current) drug therapy: Secondary | ICD-10-CM | POA: Insufficient documentation

## 2013-11-21 DIAGNOSIS — Y929 Unspecified place or not applicable: Secondary | ICD-10-CM | POA: Insufficient documentation

## 2013-11-21 DIAGNOSIS — I252 Old myocardial infarction: Secondary | ICD-10-CM | POA: Diagnosis not present

## 2013-11-21 DIAGNOSIS — M129 Arthropathy, unspecified: Secondary | ICD-10-CM | POA: Insufficient documentation

## 2013-11-21 DIAGNOSIS — G4733 Obstructive sleep apnea (adult) (pediatric): Secondary | ICD-10-CM | POA: Insufficient documentation

## 2013-11-21 DIAGNOSIS — Z862 Personal history of diseases of the blood and blood-forming organs and certain disorders involving the immune mechanism: Secondary | ICD-10-CM | POA: Diagnosis not present

## 2013-11-21 DIAGNOSIS — Y9389 Activity, other specified: Secondary | ICD-10-CM | POA: Insufficient documentation

## 2013-11-21 DIAGNOSIS — T63441A Toxic effect of venom of bees, accidental (unintentional), initial encounter: Secondary | ICD-10-CM

## 2013-11-21 DIAGNOSIS — Z7982 Long term (current) use of aspirin: Secondary | ICD-10-CM | POA: Diagnosis not present

## 2013-11-21 DIAGNOSIS — Z8739 Personal history of other diseases of the musculoskeletal system and connective tissue: Secondary | ICD-10-CM | POA: Diagnosis not present

## 2013-11-21 DIAGNOSIS — T7840XA Allergy, unspecified, initial encounter: Secondary | ICD-10-CM | POA: Diagnosis not present

## 2013-11-21 DIAGNOSIS — Z8639 Personal history of other endocrine, nutritional and metabolic disease: Secondary | ICD-10-CM | POA: Insufficient documentation

## 2013-11-21 DIAGNOSIS — T63461A Toxic effect of venom of wasps, accidental (unintentional), initial encounter: Secondary | ICD-10-CM | POA: Insufficient documentation

## 2013-11-21 DIAGNOSIS — I10 Essential (primary) hypertension: Secondary | ICD-10-CM | POA: Diagnosis not present

## 2013-11-21 DIAGNOSIS — R221 Localized swelling, mass and lump, neck: Secondary | ICD-10-CM | POA: Diagnosis not present

## 2013-11-21 DIAGNOSIS — I2510 Atherosclerotic heart disease of native coronary artery without angina pectoris: Secondary | ICD-10-CM | POA: Diagnosis not present

## 2013-11-21 DIAGNOSIS — I4891 Unspecified atrial fibrillation: Secondary | ICD-10-CM | POA: Diagnosis not present

## 2013-11-21 DIAGNOSIS — Z8679 Personal history of other diseases of the circulatory system: Secondary | ICD-10-CM | POA: Diagnosis not present

## 2013-11-21 DIAGNOSIS — M7989 Other specified soft tissue disorders: Secondary | ICD-10-CM | POA: Insufficient documentation

## 2013-11-21 DIAGNOSIS — T6391XA Toxic effect of contact with unspecified venomous animal, accidental (unintentional), initial encounter: Secondary | ICD-10-CM | POA: Insufficient documentation

## 2013-11-21 DIAGNOSIS — R220 Localized swelling, mass and lump, head: Secondary | ICD-10-CM | POA: Diagnosis not present

## 2013-11-21 DIAGNOSIS — K219 Gastro-esophageal reflux disease without esophagitis: Secondary | ICD-10-CM | POA: Diagnosis not present

## 2013-11-21 MED ORDER — HYDROCODONE-ACETAMINOPHEN 5-325 MG PO TABS
1.0000 | ORAL_TABLET | Freq: Once | ORAL | Status: DC
  Filled 2013-11-21: qty 1

## 2013-11-21 MED ORDER — FAMOTIDINE 20 MG PO TABS
40.0000 mg | ORAL_TABLET | Freq: Once | ORAL | Status: AC
  Administered 2013-11-21: 40 mg via ORAL
  Filled 2013-11-21: qty 2

## 2013-11-21 MED ORDER — EPINEPHRINE 0.3 MG/0.3ML IJ SOAJ: 0.3000 mg | INTRAMUSCULAR | Status: DC | PRN

## 2013-11-21 MED ORDER — PREDNISONE 20 MG PO TABS
60.0000 mg | ORAL_TABLET | Freq: Once | ORAL | Status: AC
  Administered 2013-11-21: 60 mg via ORAL
  Filled 2013-11-21: qty 3

## 2013-11-21 MED ORDER — IBUPROFEN 400 MG PO TABS
600.0000 mg | ORAL_TABLET | Freq: Once | ORAL | Status: AC
Start: 2013-11-21 — End: 2013-11-21
  Administered 2013-11-21: 600 mg via ORAL
  Filled 2013-11-21 (×2): qty 1

## 2013-11-21 MED ORDER — PREDNISONE 20 MG PO TABS: 40.0000 mg | ORAL_TABLET | Freq: Every day | ORAL | Status: DC

## 2013-11-21 NOTE — ED Notes (Signed)
Per pt sts she was working and [employer], operational some mulch and has gotten multiple stings on both hands. Hands red and swollen.

## 2013-11-21 NOTE — ED Notes (Signed)
PT states that vidocin makes her want to climb the walls and asked for Motrin instead

## 2013-11-21 NOTE — ED Provider Notes (Addendum)
CSN: [id number]     Arrival date & time 11/21/13  1141 History   First MD Initiated Contact with Patient 11/21/13 1158     Chief Complaint  Patient presents with  . Allergic Reaction     (Consider location/radiation/quality/duration/timing/severity/associated sxs/prior Treatment) Patient is a 68 y.o. female presenting with allergic reaction. The history is provided by the patient.  Allergic Reaction Presenting symptoms: swelling   Presenting symptoms comment:  Pain and redness in both hands Severity:  Moderate Prior allergic episodes:  Insect allergies Context comment:  Was spreading mulch and a nest of yellow jackets were in the mulch and she was stung multiple times in the hands Relieved by:  Nothing Worsened by:  Nothing tried Ineffective treatments: took a benadryl but does not seem to have helped.   Past Medical History  Diagnosis Date  . Hyperlipidemia   . Hypertension   . PAF (paroxysmal atrial fibrillation)     On rythmol.  . Tubular adenoma of colon 2007  . CAD (coronary artery disease)     a. prior stenting history. b. NSTEMI s/p DES to prox LAD with EF 45% by cath 08/14/12. c. Mild trop elevation shortly after cath ?mild plaque embolization - patent stent on relook.  . Osteoporosis   . Fatty infiltration of liver   . GERD (gastroesophageal reflux disease)   . Arthritis   . Pulmonary nodule     a. 42mm subpleural nodular density CT 08/2012, instructed to f/u pulm MD.  . Anginal pain   . Myocardial infarction 2014  . OSA on CPAP    Past Surgical History  Procedure Laterality Date  . Total abdominal hysterectomy    . Tonsillectomy    . Coronary stent placement  2000  . Coronary angioplasty with stent placement  08/14/2012    LAD  Dr [name]  . Rotator cuff repair     Family History  Problem Relation Age of Onset  . Breast cancer Maternal Aunt   . Heart disease Father   . Alzheimer's disease Mother   . Colon cancer Neg Hx    History  Substance Use  Topics  . Smoking status: Never Smoker   . Smokeless tobacco: Never Used  . Alcohol Use: Yes     Comment: 2 drinks daily    OB History   Grav Para Term Preterm Abortions TAB SAB Ect Mult Living                 Review of Systems  All other systems reviewed and are negative.     Allergies  Statins and Zetia  Home Medications   Prior to Admission medications   Medication Sig Start Date End Date Taking? Authorizing Hamsa Laurich  AMBULATORY NON FORMULARY MEDICATION CPAP Machine USES AS DIRECTED    Historical Angellynn Kimberlin, MD  amLODipine (NORVASC) 2.5 MG tablet Take 1 tablet (2.5 mg total) by mouth at bedtime. 08/20/13   Thayer Headings, MD  aspirin 81 MG tablet Take 1 tablet (81 mg total) by mouth daily. 08/15/12   Dayna N Dunn, PA-C  atenolol (TENORMIN) 25 MG tablet TAKE 1 TABLET BY MOUTH EVERY DAY 08/20/13   Thayer Headings, MD  cholecalciferol (VITAMIN D) 1000 UNITS tablet Take 1,000 Units by mouth daily.    Historical Hollin Crewe, MD  nitroGLYCERIN (NITROSTAT) 0.4 MG SL tablet Place 1 tablet (0.4 mg total) under the tongue every 5 (five) minutes as needed for chest pain (up to 3 doses). 08/20/13   Wonda Cheng Nahser,  MD  omeprazole (PRILOSEC) 20 MG capsule Take 20 mg by mouth daily.    Historical Tajai Ihde, MD  propafenone (RYTHMOL) 150 MG tablet Take 1 tablet (150 mg total) by mouth 2 (two) times daily. 08/20/13   Thayer Headings, MD  temazepam (RESTORIL) 15 MG capsule Take 15 mg by mouth at bedtime as needed for sleep.    Historical Dulcey Riederer, MD   BP 137/95  Pulse 54  Temp(Src) 98.3 F (36.8 C)  Resp 18  SpO2 100% Physical Exam  Nursing note and vitals reviewed. Constitutional: She is oriented to person, place, and time. She appears well-developed and well-nourished. No distress.  HENT:  Head: Normocephalic and atraumatic.  No tongue swelling or uvula swelling  Eyes: Pupils are equal, round, and reactive to light.  Cardiovascular: Normal rate.   Pulmonary/Chest: Effort normal and breath  sounds normal. She has no wheezes.  Musculoskeletal:  Bilateral dorsal hands red, swollen without warmth.  2+ radial pulses bilaterally  Neurological: She is alert and oriented to person, place, and time.  Skin: Skin is warm and dry. There is erythema.  Psychiatric: She has a normal mood and affect. Her behavior is normal.    ED Course  Procedures (including critical care time) Labs Review Labs Reviewed - No data to display  Imaging Review No results found.   EKG Interpretation None      MDM   Final diagnoses:  Allergic reaction to bee sting, accidental or unintentional, initial encounter    Patient here after being stung by multiple yellow jackets. She is having bilateral hand pain and swelling. Incident occurred about an hour and a half ago.  He denies shortness of breath, tongue swelling, wheezing. Patient has taken one Benadryl but came here for further care. She has edema and swelling of bilateral dorsal hands. Patient given prednisone, Pepcid and will monitor.  1:55 PM Pt feeling better and will d/c home.  Blanchie Dessert, MD 11/21/13 1355  Blanchie Dessert, MD 11/21/13 1357

## 2013-11-21 NOTE — ED Notes (Signed)
PT refused wheelchair 

## 2013-12-25 DIAGNOSIS — Z1231 Encounter for screening mammogram for malignant neoplasm of breast: Secondary | ICD-10-CM | POA: Diagnosis not present

## 2014-01-07 ENCOUNTER — Other Ambulatory Visit: Payer: Self-pay | Admitting: *Deleted

## 2014-01-29 DIAGNOSIS — H5340 Unspecified visual field defects: Secondary | ICD-10-CM | POA: Diagnosis not present

## 2014-01-29 DIAGNOSIS — H2510 Age-related nuclear cataract, unspecified eye: Secondary | ICD-10-CM | POA: Diagnosis not present

## 2014-02-24 ENCOUNTER — Telehealth: Payer: Self-pay | Admitting: Nurse Practitioner

## 2014-02-24 NOTE — Telephone Encounter (Signed)
Received notification from [place] that patient is enrolled in clinical trial

## 2014-03-02 ENCOUNTER — Telehealth: Payer: Self-pay | Admitting: *Deleted

## 2014-03-02 NOTE — Telephone Encounter (Signed)
Received call from patient. Patient states she gave her Research Drug injection on September 17th per protocol. Patient states she developed swelling and redness at injection site. Site was painful and itched for 2 days. The day after the injection the patient also developed pain in her legs that she states was similar to the pain when she took statins. The pain is still present but much improved. Shannon Cummings will not administer anymore injections at this time until we follow-up with her in the research office on Monday October 5th. We will discuss with Dr. Lia Foyer.

## 2014-03-20 DIAGNOSIS — L820 Inflamed seborrheic keratosis: Secondary | ICD-10-CM | POA: Diagnosis not present

## 2014-03-20 DIAGNOSIS — Z23 Encounter for immunization: Secondary | ICD-10-CM | POA: Diagnosis not present

## 2014-04-24 DIAGNOSIS — H18411 Arcus senilis, right eye: Secondary | ICD-10-CM | POA: Diagnosis not present

## 2014-04-24 DIAGNOSIS — H02839 Dermatochalasis of unspecified eye, unspecified eyelid: Secondary | ICD-10-CM | POA: Diagnosis not present

## 2014-04-24 DIAGNOSIS — H25011 Cortical age-related cataract, right eye: Secondary | ICD-10-CM | POA: Diagnosis not present

## 2014-04-24 DIAGNOSIS — H2511 Age-related nuclear cataract, right eye: Secondary | ICD-10-CM | POA: Diagnosis not present

## 2014-04-29 ENCOUNTER — Telehealth: Payer: Self-pay | Admitting: Cardiovascular Disease

## 2014-04-29 NOTE — Telephone Encounter (Signed)
Received request from Nurse fax box, documents faxed for surgical clearance. °To: [facility] °Fax number: [phone] °Attention: °[phone]/km °

## 2014-05-04 ENCOUNTER — Other Ambulatory Visit: Payer: Self-pay | Admitting: Cardiovascular Disease

## 2014-05-14 ENCOUNTER — Encounter (HOSPITAL_COMMUNITY): Payer: Self-pay | Admitting: Cardiovascular Disease

## 2014-06-15 DIAGNOSIS — Z961 Presence of intraocular lens: Secondary | ICD-10-CM | POA: Diagnosis not present

## 2014-06-15 DIAGNOSIS — H25811 Combined forms of age-related cataract, right eye: Secondary | ICD-10-CM | POA: Diagnosis not present

## 2014-06-15 DIAGNOSIS — H2511 Age-related nuclear cataract, right eye: Secondary | ICD-10-CM | POA: Diagnosis not present

## 2014-06-16 DIAGNOSIS — H2512 Age-related nuclear cataract, left eye: Secondary | ICD-10-CM | POA: Diagnosis not present

## 2014-06-29 DIAGNOSIS — H25812 Combined forms of age-related cataract, left eye: Secondary | ICD-10-CM | POA: Diagnosis not present

## 2014-06-29 DIAGNOSIS — Z961 Presence of intraocular lens: Secondary | ICD-10-CM | POA: Diagnosis not present

## 2014-06-29 DIAGNOSIS — H2512 Age-related nuclear cataract, left eye: Secondary | ICD-10-CM | POA: Diagnosis not present

## 2014-07-10 ENCOUNTER — Other Ambulatory Visit: Payer: Self-pay | Admitting: Cardiovascular Disease

## 2014-08-13 ENCOUNTER — Other Ambulatory Visit: Payer: Self-pay | Admitting: Cardiovascular Disease

## 2014-08-19 ENCOUNTER — Encounter: Payer: Self-pay | Admitting: Cardiovascular Disease

## 2014-08-19 ENCOUNTER — Encounter: Payer: Self-pay | Admitting: *Deleted

## 2014-08-19 ENCOUNTER — Ambulatory Visit (INDEPENDENT_AMBULATORY_CARE_PROVIDER_SITE_OTHER): Payer: Medicare Other | Admitting: Cardiovascular Disease

## 2014-08-19 VITALS — BP 128/81 | HR 46 | Ht 60.0 in | Wt 140.5 lb

## 2014-08-19 DIAGNOSIS — I480 Paroxysmal atrial fibrillation: Secondary | ICD-10-CM

## 2014-08-19 DIAGNOSIS — I2510 Atherosclerotic heart disease of native coronary artery without angina pectoris: Secondary | ICD-10-CM | POA: Diagnosis not present

## 2014-08-19 MED ORDER — PROPAFENONE HCL 150 MG PO TABS: 150.0000 mg | ORAL_TABLET | Freq: Two times a day (BID) | ORAL | Status: DC

## 2014-08-19 MED ORDER — ATENOLOL 25 MG PO TABS: 25.0000 mg | ORAL_TABLET | Freq: Every day | ORAL | Status: DC

## 2014-08-19 MED ORDER — APIXABAN 5 MG PO TABS: 5.0000 mg | ORAL_TABLET | Freq: Two times a day (BID) | ORAL | Status: DC

## 2014-08-19 MED ORDER — AMLODIPINE BESYLATE 2.5 MG PO TABS: 2.5000 mg | ORAL_TABLET | Freq: Every day | ORAL | Status: DC

## 2014-08-19 MED ORDER — NITROGLYCERIN 0.4 MG SL SUBL: 0.4000 mg | SUBLINGUAL_TABLET | SUBLINGUAL | Status: DC | PRN

## 2014-08-19 NOTE — Progress Notes (Signed)
Cardiology Office Note   Date:  08/19/2014   ID:  Shannon Cummings, DOB 1945-09-16, MRN [id number]  PCP:  Herb Grays, MD  Cardiologist:   Vesta Mixer, MD   No chief complaint on file.  1. Coronary artery disease-status post stenting in the past. She status post stenting of her proximal LAD 08/14/2012 2. Intermittent atrial fibrillation 3.  hyperlipidemia-she has been generally intolerant to most statins  History of Present Illness:  Pt is doing well. No cardiac complaints.   She complains of some tingling and numbness associated with the Crestor. She has discontinued the Crestor because of that reason. She has had reactions to most statins that we have tried.  October 08-2011  She has done well. She has not had any angina or eposides of atrial fib. She has retired and is traveling quite a bit. Her husband has also retired. She has not had any problems.  October 01, 2012:  She is having some bruising ( due to [place]). She is enjoying the cardiac rehab. She has noticed some low BP readings at the end of cardiac rehab. She does not feel bad. These low readings are typically asymptomatic.   No angina. No arrhythmias.   August 20, 2013:  Shannon Cummings is doing ok. She was admitted to the hospital with some CP recently. Rule out for MI.  She has been doing Ok. Has gained a bit of weight. She is not able to exercise because Of left ankle problems.    August 19, 2014:   Shannon Cummings is a 69 y.o. female who presents for follow up of her atrial fib. Has gained some weight.  She is in the study for lipid management Has had leg cramps since last fall. Off and on  Has had more paroxysmal atrial fib.  Lasts about a minute. Seem to be occuring more frequently.     Past Medical History  Diagnosis Date  . Hyperlipidemia   . Hypertension   . PAF (paroxysmal atrial fibrillation)     On rythmol.  . Tubular adenoma of colon 2007  . CAD (coronary artery  disease)     a. prior stenting history. b. NSTEMI s/p DES to prox LAD with EF 45% by cath 08/14/12. c. Mild trop elevation shortly after cath ?mild plaque embolization - patent stent on relook.  . Osteoporosis   . Fatty infiltration of liver   . GERD (gastroesophageal reflux disease)   . Arthritis   . Pulmonary nodule     a. 8mm subpleural nodular density CT 08/2012, instructed to f/u pulm MD.  . Anginal pain   . Myocardial infarction 2014  . OSA on CPAP     Past Surgical History  Procedure Laterality Date  . Total abdominal hysterectomy    . Tonsillectomy    . Coronary stent placement  2000  . Coronary angioplasty with stent placement  08/14/2012    LAD  Dr Tonny Bollman  . Rotator cuff repair    . Left heart catheterization with coronary angiogram N/A 08/14/2012    Procedure: LEFT HEART CATHETERIZATION WITH CORONARY ANGIOGRAM;  Surgeon: Tonny Bollman, MD;  Location: [facility] CATH LAB;  Service: Cardiovascular;  Laterality: N/A;  . Left heart catheterization with coronary angiogram N/A 08/19/2012    Procedure: LEFT HEART CATHETERIZATION WITH CORONARY ANGIOGRAM;  Surgeon: Tonny Bollman, MD;  Location: [facility] CATH LAB;  Service: Cardiovascular;  Laterality: N/A;     Current Outpatient Prescriptions  Medication Sig Dispense Refill  . AMBULATORY NON FORMULARY  MEDICATION CPAP Machine USES AS DIRECTED    . amLODipine (NORVASC) 2.5 MG tablet Take 1 tablet (2.5 mg total) by mouth at bedtime. 90 tablet 3  . aspirin EC 81 MG tablet Take 81 mg by mouth at bedtime.    Marland Kitchen atenolol (TENORMIN) 25 MG tablet Take 1 tablet (25 mg total) by mouth daily. 90 tablet 3  . cholecalciferol (VITAMIN D) 1000 UNITS tablet Take 1,000 Units by mouth daily.    Marland Kitchen EPINEPHrine (EPIPEN) 0.3 mg/0.3 mL IJ SOAJ injection Inject 0.3 mLs (0.3 mg total) into the muscle as needed. 1 Device 1  . nitroGLYCERIN (NITROSTAT) 0.4 MG SL tablet Place 1 tablet (0.4 mg total) under the tongue every 5 (five) minutes as needed for chest pain  (up to 3 doses). 25 tablet 6  . omeprazole (PRILOSEC) 20 MG capsule Take 20 mg by mouth daily.    . propafenone (RYTHMOL) 150 MG tablet Take 1 tablet (150 mg total) by mouth 2 (two) times daily. 180 tablet 3  . temazepam (RESTORIL) 15 MG capsule Take 15 mg by mouth at bedtime as needed for sleep.    Marland Kitchen apixaban (ELIQUIS) 5 MG TABS tablet Take 1 tablet (5 mg total) by mouth 2 (two) times daily. 60 tablet 11  . ibuprofen (ADVIL,MOTRIN) 200 MG tablet Take 400 mg by mouth every 6 (six) hours as needed for headache.    . Investigational - Study Medication Inject 150 mg into the skin every 14 (fourteen) days. bococizumab vs placebo    . predniSONE (DELTASONE) 20 MG tablet Take 2 tablets (40 mg total) by mouth daily. (Patient not taking: Reported on 08/19/2014) 6 tablet 0   No current facility-administered medications for this visit.    Allergies:   Other; Statins; and Zetia    Social History:  The patient  reports that she has never smoked. She has never used smokeless tobacco. She reports that she drinks alcohol. She reports that she does not use illicit drugs.   Family History:  The patient's family history includes Alzheimer's disease in her mother; Breast cancer in her maternal aunt; Heart disease in her father. There is no history of Colon cancer.    ROS:  Please see the history of present illness.    Review of Systems: Constitutional:  denies fever, chills, diaphoresis, appetite change and fatigue.  HEENT: denies photophobia, eye pain, redness, hearing loss, ear pain, congestion, sore throat, rhinorrhea, sneezing, neck pain, neck stiffness and tinnitus.  Respiratory: denies SOB, DOE, cough, chest tightness, and wheezing.  Cardiovascular: denies chest pain, palpitations and leg swelling.  Gastrointestinal: denies nausea, vomiting, abdominal pain, diarrhea, constipation, blood in stool.  Genitourinary: denies dysuria, urgency, frequency, hematuria, flank pain and difficulty urinating.    Musculoskeletal: denies  myalgias, back pain, joint swelling, arthralgias and gait problem.   Skin: denies pallor, rash and wound.  Neurological: denies dizziness, seizures, syncope, weakness, light-headedness, numbness and headaches.   Hematological: denies adenopathy, easy bruising, personal or family bleeding history.  Psychiatric/ Behavioral: denies suicidal ideation, mood changes, confusion, nervousness, sleep disturbance and agitation.       All other systems are reviewed and negative.    PHYSICAL EXAM: VS:  BP 128/81 mmHg  Pulse 46  Ht 5' (1.524 m)  Wt 140 lb 8 oz (63.73 kg)  BMI 27.44 kg/m2 , BMI Body mass index is 27.44 kg/(m^2). GEN: Well nourished, well developed, in no acute distress HEENT: normal Neck: no JVD, carotid bruits, or masses Cardiac: RRR; no murmurs, rubs,  or gallops,no edema  Respiratory:  clear to auscultation bilaterally, normal work of breathing GI: soft, nontender, nondistended, + BS MS: no deformity or atrophy Skin: warm and dry, no rash Neuro:  Strength and sensation are intact Psych: normal   EKG:  EKG is ordered today. The ekg ordered today demonstrates  Marked sinus bradycardia at 46.    Recent Labs: No results found for requested labs within last 365 days.    Lipid Panel    Component Value Date/Time   CHOL 245* 07/29/2013 0341   TRIG 152* 07/29/2013 0341   HDL 49 07/29/2013 0341   CHOLHDL 5.0 07/29/2013 0341   VLDL 30 07/29/2013 0341   LDLCALC 166* 07/29/2013 0341   LDLDIRECT 174.0 12/31/2012 0754      Wt Readings from Last 3 Encounters:  08/19/14 140 lb 8 oz (63.73 kg)  08/26/13 134 lb (60.782 kg)  08/20/13 135 lb 1.9 oz (61.29 kg)      Other studies Reviewed: Additional studies/ records that were reviewed today include: . Review of the above records demonstrates:    ASSESSMENT AND PLAN:  1. Coronary artery disease-status post stenting in the past. She status post stenting of her proximal LAD 08/14/2012. She's  not having any episodes of chest pain.  2. Intermittent atrial fibrillation - she's having a little bit more paroxysmal atrial fibrillation. We'll continue the current dose of Rythmol. She's been on Rythmol for years and has done well. This been no evidence of proarrhythmia-despite her having significant coronary artery disease. Since she is having more paroxysmal atrial fibrillation, we'll start her back on anticoagulant. Will start Eliquis 5 mg twice a day.  3.  hyperlipidemia-she has been generally intolerant to most statins. She's on his bare-metal protocol to [facility]. She's getting an injectable cholesterol medication versus placebo. Would not allow to check her statins.  I'll see her in 6 months for follow-up visit.   Current medicines are reviewed at length with the patient today.  The patient does not have concerns regarding medicines.  The following changes have been made:  no change  Labs/ tests ordered today include:   Orders Placed This Encounter  Procedures  . EKG 12-Lead     Disposition:   FU with me in  6 months.    Signed, Blaine Hari, Deloris Ping, MD  08/19/2014 6:34 PM    [facility] [address] [place], [place], [place]  [address] Phone: [phone]; Fax: [phone]

## 2014-08-19 NOTE — Patient Instructions (Signed)
Your physician has recommended you make the following change in your medication:  RESTART Eliquis 5 mg twice daily  Your physician wants you to follow-up in: 6 months with Dr. Acie Fredrickson.  You will receive a reminder letter in the mail two months in advance. If you don't receive a letter, please call our office to schedule the follow-up appointment.

## 2014-09-08 ENCOUNTER — Encounter: Payer: Self-pay | Admitting: *Deleted

## 2014-09-08 NOTE — Progress Notes (Signed)
Patient ID: Irania Durell, female   DOB: [birth year], 69 y.o.   MRN: [id number]   [facility] Informed Consent  Subject Name: Averyanna Sax  Subject met inclusion and exclusion criteria. The informed consent form, study requirements and expectations were reviewed with the subject and questions and concerns were addressed prior to the signing of the consent form. The subject verbalized understanding of the trail requirements. The subject agreed to participate in the [facility] trial and signed the informed consent. The informed consent was obtained prior to performance of any protocol-specific procedures for the subject. A copy of the signed informed consent was given to the subject and a copy was placed in the subject's medical record.   Jake Bathe, RN [phone]

## 2014-09-22 ENCOUNTER — Encounter: Payer: Self-pay | Admitting: *Deleted

## 2014-09-22 NOTE — Progress Notes (Signed)
Subject re- consented to Main Informed Consent Version 5.0 and addendum Version 2.0.  IRB approval date [phone],

## 2014-09-24 DIAGNOSIS — L570 Actinic keratosis: Secondary | ICD-10-CM | POA: Diagnosis not present

## 2014-09-24 DIAGNOSIS — L814 Other melanin hyperpigmentation: Secondary | ICD-10-CM | POA: Diagnosis not present

## 2014-11-30 ENCOUNTER — Other Ambulatory Visit: Payer: Self-pay

## 2014-12-17 DIAGNOSIS — L570 Actinic keratosis: Secondary | ICD-10-CM | POA: Diagnosis not present

## 2014-12-17 DIAGNOSIS — Z1231 Encounter for screening mammogram for malignant neoplasm of breast: Secondary | ICD-10-CM | POA: Diagnosis not present

## 2014-12-17 DIAGNOSIS — L814 Other melanin hyperpigmentation: Secondary | ICD-10-CM | POA: Diagnosis not present

## 2014-12-17 DIAGNOSIS — L820 Inflamed seborrheic keratosis: Secondary | ICD-10-CM | POA: Diagnosis not present

## 2014-12-17 DIAGNOSIS — L821 Other seborrheic keratosis: Secondary | ICD-10-CM | POA: Diagnosis not present

## 2015-01-29 ENCOUNTER — Encounter: Payer: Self-pay | Admitting: *Deleted

## 2015-01-29 DIAGNOSIS — Z006 Encounter for examination for normal comparison and control in clinical research program: Secondary | ICD-10-CM

## 2015-01-29 NOTE — Progress Notes (Signed)
Late entry:  Patient completed visit 11 on 17 Aug2016.  BP 126/84, pulse 50.  Physical exam at this visit was conducted by Dr. Bing Quarry.  ECG showed no significant changes from last year.  Physical and Neuro exams were normal in all fields.  Patient has received re-supply of [place] drug on 22Jun2016.

## 2015-03-09 ENCOUNTER — Telehealth: Payer: Self-pay | Admitting: Internal Medicine

## 2015-03-09 NOTE — Telephone Encounter (Signed)
lmtcb x1 

## 2015-03-09 NOTE — Telephone Encounter (Signed)
Not sure there would be any big advantage to the requested machine to be worth paying out of pocket for. Has she gone to look at the new one at [facility]? I think her current machine can be set to auto adjust if needed. Mostly this is a [employer] by the manufacturer. I don't mind witting the letter, but would need to be able to state why this device would be better.

## 2015-03-09 NOTE — Telephone Encounter (Signed)
Patient says that [employer] makes a new CPAP machine for women.  It has auto settings, heated humidifier, etc...patient wants to know if she can get the new machine.  She says that she knows insurance will not pay for it, but she said that if she can get a prescription from Dr. Annamaria Boots saying that this will benefit her more than the one she currently has, then the insurance will pay for the new CPAP.  She said that she would be willing to pay for it if she needs to.  This machine is suppose to be smaller and lighter in weight.

## 2015-03-10 NOTE — Telephone Encounter (Signed)
I called spoke with pt. Aware of response below. She will hold off for now. Nothing further needed

## 2015-03-31 ENCOUNTER — Encounter: Payer: Self-pay | Admitting: *Deleted

## 2015-03-31 DIAGNOSIS — Z006 Encounter for examination for normal comparison and control in clinical research program: Secondary | ICD-10-CM

## 2015-03-31 NOTE — Progress Notes (Signed)
Patient visited [facility] for scheduled resupply of medication.  Labs drawn and sent to central laboratory.  Temp 98.1, BP 127/87, pulse 56.  No adverse or serious adverse events to report.  No new con medications. Patient voiced no concerns or complaints. Next appointment scheduled for July 22, 2015.

## 2015-04-09 ENCOUNTER — Encounter: Payer: Self-pay | Admitting: *Deleted

## 2015-04-09 DIAGNOSIS — Z006 Encounter for examination for normal comparison and control in clinical research program: Secondary | ICD-10-CM

## 2015-04-09 NOTE — Progress Notes (Signed)
Informational telephone call to inform patient that the SPIRE I and SPIRE II Research study has come to a close and NOT to take any more injections of the investigational Product. Patient states her last dose was last  October 26th. Informed patient that we will be contacting patient next week to schedule End of study visit. Questions encouraged and answered.

## 2015-04-19 ENCOUNTER — Encounter: Payer: Self-pay | Admitting: Gastroenterology

## 2015-04-30 ENCOUNTER — Encounter: Payer: Self-pay | Admitting: *Deleted

## 2015-04-30 DIAGNOSIS — Z006 Encounter for examination for normal comparison and control in clinical research program: Secondary | ICD-10-CM

## 2015-04-30 NOTE — Progress Notes (Signed)
Subject has completed the SPIRE II research trial.  No longer injecting bococizumab vs placebo.  Physical and neurological exam conducted by Dr. Lia Foyer.  Temp:97.5 BP 139/80 Pulse 53.

## 2015-05-21 ENCOUNTER — Encounter: Payer: Self-pay | Admitting: *Deleted

## 2015-05-21 NOTE — Progress Notes (Unsigned)
Called patient to assess for any reportable events related to the [facility] II trial.  None to report. Subject participation has ended for this study.

## 2015-06-22 ENCOUNTER — Encounter: Payer: Self-pay | Admitting: Cardiovascular Disease

## 2015-06-23 ENCOUNTER — Encounter: Payer: Self-pay | Admitting: Cardiovascular Disease

## 2015-06-23 ENCOUNTER — Ambulatory Visit (INDEPENDENT_AMBULATORY_CARE_PROVIDER_SITE_OTHER): Payer: Medicare Other | Admitting: Cardiovascular Disease

## 2015-06-23 VITALS — BP 142/100 | HR 53 | Ht 60.0 in | Wt 140.4 lb

## 2015-06-23 DIAGNOSIS — I2510 Atherosclerotic heart disease of native coronary artery without angina pectoris: Secondary | ICD-10-CM | POA: Diagnosis not present

## 2015-06-23 DIAGNOSIS — I480 Paroxysmal atrial fibrillation: Secondary | ICD-10-CM | POA: Diagnosis not present

## 2015-06-23 LAB — COMPREHENSIVE METABOLIC PANEL
ALK PHOS: 63 U/L (ref 33–130)
ALT: 25 U/L (ref 6–29)
AST: 25 U/L (ref 10–35)
Albumin: 4.1 g/dL (ref 3.6–5.1)
BILIRUBIN TOTAL: 0.5 mg/dL (ref 0.2–1.2)
BUN: 13 mg/dL (ref 7–25)
CO2: 26 mmol/L (ref 20–31)
CREATININE: 0.84 mg/dL (ref 0.50–0.99)
Calcium: 9 mg/dL (ref 8.6–10.4)
Chloride: 103 mmol/L (ref 98–110)
GLUCOSE: 82 mg/dL (ref 65–99)
Potassium: 4.2 mmol/L (ref 3.5–5.3)
SODIUM: 139 mmol/L (ref 135–146)
TOTAL PROTEIN: 6.7 g/dL (ref 6.1–8.1)

## 2015-06-23 LAB — LIPID PANEL
CHOL/HDL RATIO: 5.8 ratio — AB (ref <=5.0)
CHOLESTEROL: 256 mg/dL — AB (ref 125–200)
HDL: 44 mg/dL — ABNORMAL LOW (ref >=46)
LDL Cholesterol: 169 mg/dL — ABNORMAL HIGH (ref <130)
Triglycerides: 215 mg/dL — ABNORMAL HIGH (ref <150)
VLDL: 43 mg/dL — ABNORMAL HIGH (ref <30)

## 2015-06-23 NOTE — Patient Instructions (Signed)
Medication Instructions:  Your physician recommends that you continue on your current medications as directed. Please refer to the Current Medication list given to you today.   Labwork: TODAY - cholesterol, liver, basic metabolic panel   Testing/Procedures: None Ordered   Follow-Up: Your physician wants you to follow-up in: 1 year with Dr. Nahser.  You will receive a reminder letter in the mail two months in advance. If you don't receive a letter, please call our office to schedule the follow-up appointment.   If you need a refill on your cardiac medications before your next appointment, please call your pharmacy.   Thank you for choosing [facility]! Leily Capek, RN [phone]   

## 2015-06-23 NOTE — Progress Notes (Signed)
Cardiology Office Note   Date:  06/23/2015   ID:  Shannon Cummings, DOB Jul 24, 1945, MRN [id number]  PCP:  Shannon Grays, MD  Cardiologist:   Shannon Mixer, MD   Chief Complaint  Patient presents with  . Coronary Artery Disease   1. Coronary artery disease-status post stenting in the past. She status post stenting of her proximal LAD 08/14/2012 2. Intermittent atrial fibrillation 3.  hyperlipidemia-she has been generally intolerant to most statins  History of Present Illness:  Pt is doing well. No cardiac complaints.   She complains of some tingling and numbness associated with the Crestor. She has discontinued the Crestor because of that reason. She has had reactions to most statins that we have tried.  October 08-2011  She has done well. She has not had any angina or eposides of atrial fib. She has retired and is traveling quite a bit. Her husband has also retired. She has not had any problems.  October 01, 2012:  She is having some bruising ( due to [place]). She is enjoying the cardiac rehab. She has noticed some low BP readings at the end of cardiac rehab. She does not feel bad. These low readings are typically asymptomatic.   No angina. No arrhythmias.   August 20, 2013:  Shannon Cummings is doing ok. She was admitted to the hospital with some CP recently. Rule out for MI.  She has been doing Ok. Has gained a bit of weight. She is not able to exercise because Of left ankle problems.    August 19, 2014:   Shannon Cummings is a 70 y.o. female who presents for follow up of her atrial fib. Has gained some weight.  She is in the study for lipid management Has had leg cramps since last fall. Off and on  Has had more paroxysmal atrial fib.  Lasts about a minute. Seem to be occuring more frequently.   Jan. 18, 2017:  Shannon Cummings presents for follow up of her CAD and atrial fib.  Doing well.   Has visited [place] recently .   No CP or dyspnea.   Tolerates the  Rythmol.  Talked about her daughter who lives in [place].  Is not having any significant atrial fib issues.   Has been on lipid lowering study drug at [facility]  ( injectable cholesterol medication )    Past Medical History  Diagnosis Date  . Hyperlipidemia   . Hypertension   . PAF (paroxysmal atrial fibrillation) (HCC)     On rythmol.  . Tubular adenoma of colon 2007  . CAD (coronary artery disease)     a. prior stenting history. b. NSTEMI s/p DES to prox LAD with EF 45% by cath 08/14/12. c. Mild trop elevation shortly after cath ?mild plaque embolization - patent stent on relook.  . Osteoporosis   . Fatty infiltration of liver   . GERD (gastroesophageal reflux disease)   . Arthritis   . Pulmonary nodule     a. 8mm subpleural nodular density CT 08/2012, instructed to f/u pulm MD.  . Anginal pain (HCC)   . Myocardial infarction (HCC) 2014  . OSA on CPAP     Past Surgical History  Procedure Laterality Date  . Total abdominal hysterectomy    . Tonsillectomy    . Coronary stent placement  2000  . Coronary angioplasty with stent placement  08/14/2012    LAD  Dr Tonny Bollman  . Rotator cuff repair    . Left heart catheterization with coronary  angiogram N/A 08/14/2012    Procedure: LEFT HEART CATHETERIZATION WITH CORONARY ANGIOGRAM;  Surgeon: Tonny Bollman, MD;  Location: [facility] CATH LAB;  Service: Cardiovascular;  Laterality: N/A;  . Left heart catheterization with coronary angiogram N/A 08/19/2012    Procedure: LEFT HEART CATHETERIZATION WITH CORONARY ANGIOGRAM;  Surgeon: Tonny Bollman, MD;  Location: [facility] CATH LAB;  Service: Cardiovascular;  Laterality: N/A;     Current Outpatient Prescriptions  Medication Sig Dispense Refill  . AMBULATORY NON FORMULARY MEDICATION CPAP Machine USES AS DIRECTED    . amLODipine (NORVASC) 2.5 MG tablet Take 1 tablet (2.5 mg total) by mouth at bedtime. 90 tablet 3  . apixaban (ELIQUIS) 5 MG TABS tablet Take 1 tablet (5 mg total) by mouth 2 (two)  times daily. 60 tablet 11  . aspirin EC 81 MG tablet Take 81 mg by mouth at bedtime.    Marland Kitchen atenolol (TENORMIN) 25 MG tablet Take 1 tablet (25 mg total) by mouth daily. 90 tablet 3  . cholecalciferol (VITAMIN D) 1000 UNITS tablet Take 1,000 Units by mouth daily.    Marland Kitchen EPINEPHrine (EPIPEN) 0.3 mg/0.3 mL IJ SOAJ injection Inject 0.3 mLs (0.3 mg total) into the muscle as needed. 1 Device 1  . Investigational - Study Medication Inject 150 mg into the skin every 14 (fourteen) days. bococizumab vs placebo    . nitroGLYCERIN (NITROSTAT) 0.4 MG SL tablet Place 1 tablet (0.4 mg total) under the tongue every 5 (five) minutes as needed for chest pain (up to 3 doses). 25 tablet 6  . omeprazole (PRILOSEC) 20 MG capsule Take 20 mg by mouth daily.    . predniSONE (DELTASONE) 20 MG tablet Take 2 tablets (40 mg total) by mouth daily. 6 tablet 0  . propafenone (RYTHMOL) 150 MG tablet Take 1 tablet (150 mg total) by mouth 2 (two) times daily. 180 tablet 3  . temazepam (RESTORIL) 15 MG capsule Take 15 mg by mouth at bedtime as needed for sleep.    Marland Kitchen ibuprofen (ADVIL,MOTRIN) 200 MG tablet Take 400 mg by mouth every 6 (six) hours as needed for headache. Reported on 06/23/2015     No current facility-administered medications for this visit.    Allergies:   Other; Statins; and Zetia    Social History:  The patient  reports that she has never smoked. She has never used smokeless tobacco. She reports that she drinks alcohol. She reports that she does not use illicit drugs.   Family History:  The patient's family history includes Alzheimer's disease in her mother; Breast cancer in her maternal aunt; Heart disease in her father. There is no history of Colon cancer.    ROS:  Please see the history of present illness.    Review of Systems: Constitutional:  denies fever, chills, diaphoresis, appetite change and fatigue.  HEENT: denies photophobia, eye pain, redness, hearing loss, ear pain, congestion, sore throat,  rhinorrhea, sneezing, neck pain, neck stiffness and tinnitus.  Respiratory: denies SOB, DOE, cough, chest tightness, and wheezing.  Cardiovascular: denies chest pain, palpitations and leg swelling.  Gastrointestinal: denies nausea, vomiting, abdominal pain, diarrhea, constipation, blood in stool.  Genitourinary: denies dysuria, urgency, frequency, hematuria, flank pain and difficulty urinating.  Musculoskeletal: denies  myalgias, back pain, joint swelling, arthralgias and gait problem.   Skin: denies pallor, rash and wound.  Neurological: denies dizziness, seizures, syncope, weakness, light-headedness, numbness and headaches.   Hematological: denies adenopathy, easy bruising, personal or family bleeding history.  Psychiatric/ Behavioral: denies suicidal ideation, mood changes, confusion, nervousness,  sleep disturbance and agitation.       All other systems are reviewed and negative.    PHYSICAL EXAM: VS:  BP 142/100 mmHg  Pulse 53  Ht 5' (1.524 m)  Wt 140 lb 6.4 oz (63.685 kg)  BMI 27.42 kg/m2  SpO2 95% , BMI Body mass index is 27.42 kg/(m^2). GEN: Well nourished, well developed, in no acute distress HEENT: normal Neck: no JVD, carotid bruits, or masses Cardiac: RRR; no murmurs, rubs, or gallops,no edema  Respiratory:  clear to auscultation bilaterally, normal work of breathing GI: soft, nontender, nondistended, + BS MS: no deformity or atrophy Skin: warm and dry, no rash Neuro:  Strength and sensation are intact Psych: normal   EKG:  EKG is ordered today. The ekg ordered today demonstrates  Marked sinus bradycardia at 46.    Recent Labs: No results found for requested labs within last 365 days.    Lipid Panel    Component Value Date/Time   CHOL 245* 07/29/2013 0341   TRIG 152* 07/29/2013 0341   HDL 49 07/29/2013 0341   CHOLHDL 5.0 07/29/2013 0341   VLDL 30 07/29/2013 0341   LDLCALC 166* 07/29/2013 0341   LDLDIRECT 174.0 12/31/2012 0754      Wt Readings from  Last 3 Encounters:  06/23/15 140 lb 6.4 oz (63.685 kg)  08/19/14 140 lb 8 oz (63.73 kg)  08/26/13 134 lb (60.782 kg)      Other studies Reviewed: Additional studies/ records that were reviewed today include: . Review of the above records demonstrates:    ASSESSMENT AND PLAN:  1. Coronary artery disease-status post stenting in the past. She status post stenting of her proximal LAD 08/14/2012. She's not having any episodes of chest pain.  2. Intermittent atrial fibrillation - she's having a little bit more paroxysmal atrial fibrillation. We'll continue the current dose of Rythmol. She's been on Rythmol for years and has done well. This been no evidence of proarrhythmia-despite her having significant coronary artery disease. Since she is having more paroxysmal atrial fibrillation, we'll start her back on anticoagulant. Will start Eliquis 5 mg twice a day.  3.  hyperlipidemia-she has been generally intolerant to most statins. She would like to get involved with another [place] lipid study .   I'll see her in 6 months for follow-up visit.   Current medicines are reviewed at length with the patient today.  The patient does not have concerns regarding medicines.  The following changes have been made:  no change  Labs/ tests ordered today include:   No orders of the defined types were placed in this encounter.     Disposition:   FU with me in  6 months.    Signed, Katrisha Segall, Deloris Ping, MD  06/23/2015 11:11 AM    [facility] [address] [place], [place], [place]  [address] Phone: [phone]; Fax: [phone]

## 2015-06-24 DIAGNOSIS — H26493 Other secondary cataract, bilateral: Secondary | ICD-10-CM | POA: Diagnosis not present

## 2015-06-30 DIAGNOSIS — L570 Actinic keratosis: Secondary | ICD-10-CM | POA: Diagnosis not present

## 2015-06-30 DIAGNOSIS — L821 Other seborrheic keratosis: Secondary | ICD-10-CM | POA: Diagnosis not present

## 2015-06-30 DIAGNOSIS — Z23 Encounter for immunization: Secondary | ICD-10-CM | POA: Diagnosis not present

## 2015-06-30 DIAGNOSIS — L918 Other hypertrophic disorders of the skin: Secondary | ICD-10-CM | POA: Diagnosis not present

## 2015-07-15 ENCOUNTER — Encounter: Payer: Self-pay | Admitting: Pharmacist

## 2015-07-15 ENCOUNTER — Ambulatory Visit (INDEPENDENT_AMBULATORY_CARE_PROVIDER_SITE_OTHER): Payer: Medicare Other | Admitting: Pharmacist

## 2015-07-15 DIAGNOSIS — E785 Hyperlipidemia, unspecified: Secondary | ICD-10-CM | POA: Diagnosis not present

## 2015-07-15 DIAGNOSIS — I2510 Atherosclerotic heart disease of native coronary artery without angina pectoris: Secondary | ICD-10-CM | POA: Diagnosis not present

## 2015-07-15 NOTE — Patient Instructions (Addendum)
Start taking fish oil 2,000mg  a day - you can find this over the counter, [employer] is a good brand. We will let you know when we are enrolling in our new research study.  If you have any questions, call Megan in lipid clinic [phone]. For your acid reflux - you can try Zantac or Pepcid (ranitidine or famotidine are the generic names, store brand is fine to take).

## 2015-07-15 NOTE — Progress Notes (Signed)
Patient ID: Shannon Cummings                 DOB: 22-Feb-1946                         MRN: [id number]     HPI: Shannon Cummings is a 70 y.o. female patient referred to lipid clinic by Dr. Elease Hashimoto. Patient has a h/o CAD (PCI in 2000, MI with PCI 08/2012) and inability to tolerate lipid lowering therapies. She has failed multiple statins, including Crestor 5 mg once weekly, Lipitor 10 mg twice weekly, Simvastatin daily, and she thinks Livalo daily as well. She is not interested in rechallenging statins. She also failed Zetia in the past (last 2013). She was previously in a PCSK-9 inhibitor study (SPIRE II) but trial was stopped early. She is not currently taking any lipid lowering medications. Patient is interested in enrolling into a new study.  Spent time updating pt's medication list and advising on OTC acid reflux options - Prilosec was on her med list but she reported that she had never taken this. Other updates included: no longer taking ibuprofen or prednisone.  Current Medications: Not on lipid lowering therapy currently. Intolerances:  Crestor 5 mg once weekly and also daily (muscle aches in legs) Lipitor 10 mg twice weekly and daily (muscle aches in legs) Simvastatin daily (muscle aches)  She thinks she tried Livalo as well, but not positive Zetia (leg aches)  Risk Factors: CAD (had MI and PCI 08/2012), HTN, age LDL goal: < 70, non-HDL goal < 100  Diet: Doesn't eat many sweets. Drinks sweet tea but adds very little sugar to a pitcher. Very seldom eats red meat. Cooks fish at home.  Exercise: Goes to the [facility] and uses treadmill or bike, also takes yoga class. She states she likes to walk in the park when it is nice outside.  Family History: Strong family h/o of CAD on her father's side. Father died at 35 of MI. Her son has elevated LDL as well, but no heart disease.  Social History: Denies tobacco use. Drinks 1-2 alcoholic drinks a few days of the week.  Labs: 06/2015: TC 256, LDL  169, HDL 44, TG 215 (no therapy, SPIRE II trial ended Nov 2016, was ok for lipid redraw)  Past Medical History  Diagnosis Date  . Hyperlipidemia   . Hypertension   . PAF (paroxysmal atrial fibrillation) (HCC)     On rythmol.  . Tubular adenoma of colon 2007  . CAD (coronary artery disease)     a. prior stenting history. b. NSTEMI s/p DES to prox LAD with EF 45% by cath 08/14/12. c. Mild trop elevation shortly after cath ?mild plaque embolization - patent stent on relook.  . Osteoporosis   . Fatty infiltration of liver   . GERD (gastroesophageal reflux disease)   . Arthritis   . Pulmonary nodule     a. 8mm subpleural nodular density CT 08/2012, instructed to f/u pulm MD.  . Anginal pain (HCC)   . Myocardial infarction (HCC) 2014  . OSA on CPAP     Current Outpatient Prescriptions on File Prior to Visit  Medication Sig Dispense Refill  . AMBULATORY NON FORMULARY MEDICATION CPAP Machine USES AS DIRECTED    . amLODipine (NORVASC) 2.5 MG tablet Take 1 tablet (2.5 mg total) by mouth at bedtime. 90 tablet 3  . apixaban (ELIQUIS) 5 MG TABS tablet Take 1 tablet (5 mg total) by mouth 2 (two)  times daily. 60 tablet 11  . aspirin EC 81 MG tablet Take 81 mg by mouth at bedtime.    Marland Kitchen atenolol (TENORMIN) 25 MG tablet Take 1 tablet (25 mg total) by mouth daily. 90 tablet 3  . cholecalciferol (VITAMIN D) 1000 UNITS tablet Take 1,000 Units by mouth daily.    Marland Kitchen EPINEPHrine (EPIPEN) 0.3 mg/0.3 mL IJ SOAJ injection Inject 0.3 mLs (0.3 mg total) into the muscle as needed. 1 Device 1  . ibuprofen (ADVIL,MOTRIN) 200 MG tablet Take 400 mg by mouth every 6 (six) hours as needed for headache. Reported on 06/23/2015    . Investigational - Study Medication Inject 150 mg into the skin every 14 (fourteen) days. bococizumab vs placebo    . nitroGLYCERIN (NITROSTAT) 0.4 MG SL tablet Place 1 tablet (0.4 mg total) under the tongue every 5 (five) minutes as needed for chest pain (up to 3 doses). 25 tablet 6  .  omeprazole (PRILOSEC) 20 MG capsule Take 20 mg by mouth daily.    . predniSONE (DELTASONE) 20 MG tablet Take 2 tablets (40 mg total) by mouth daily. 6 tablet 0  . propafenone (RYTHMOL) 150 MG tablet Take 1 tablet (150 mg total) by mouth 2 (two) times daily. 180 tablet 3  . temazepam (RESTORIL) 15 MG capsule Take 15 mg by mouth at bedtime as needed for sleep.     No current facility-administered medications on file prior to visit.    Allergies  Allergen Reactions  . Other     Intolerance to strong pain medications=  Make her feel anxious and feel like coming out of skin  . Statins Other (See Comments)    Restless legs, bad feeling.  This has occurred with Crestor 5 mg once weekly, Lipitor 10 mg twice weekly, simvastatin qd  . Zetia [Ezetimibe]     Leg aches    Assessment/Plan:  1. Hyperlipidemia - TG were elevated at 215, will start fish oil 2g daily.  Pt's LDL above goal 70mg /dL at 169mg /dL. She is not currently on any therapy and has an extensive history of intolerance to multiple statins and Zetia. She was previously enrolled in the [facility] II trial which ended early in November 2016. Discussed treatment options with patient today, including PCSK-9 inhibitor and enrolling in a new lipid study. Pt would like to wait until she hears in March whether or not she was in the active arm of the SPIRE II trial - if she was and her LDL was greatly improved, she may consider PCSK9i. At this time, she is very hesitant due to the cost (avg monthly copay $200-400). She is also willing to consider participating in a new study if she does not start on the PCSK-9 inhibitor. We will likely start enrolling in the next few months and will contact patient when we do.   2. Acid reflux - advised pt on OTC Pepcid or Zantac to help with symptoms. Advised that if this does not control her symptoms or if symptoms become a daily reoccurrence, to consult with her PCP for PPI therapy.  Trew Sunde E. Farhiya Rosten, PharmD, CPP [facility] [address] [place], [place] [address] Phone: [phone]; Fax: [phone] 07/15/2015 11:51 AM

## 2015-07-20 DIAGNOSIS — Z961 Presence of intraocular lens: Secondary | ICD-10-CM | POA: Diagnosis not present

## 2015-07-20 DIAGNOSIS — H26491 Other secondary cataract, right eye: Secondary | ICD-10-CM | POA: Diagnosis not present

## 2015-07-20 DIAGNOSIS — H02839 Dermatochalasis of unspecified eye, unspecified eyelid: Secondary | ICD-10-CM | POA: Diagnosis not present

## 2015-07-20 DIAGNOSIS — I10 Essential (primary) hypertension: Secondary | ICD-10-CM | POA: Diagnosis not present

## 2015-07-20 DIAGNOSIS — H18411 Arcus senilis, right eye: Secondary | ICD-10-CM | POA: Diagnosis not present

## 2015-07-27 DIAGNOSIS — Z23 Encounter for immunization: Secondary | ICD-10-CM | POA: Diagnosis not present

## 2015-08-13 DIAGNOSIS — H26492 Other secondary cataract, left eye: Secondary | ICD-10-CM | POA: Diagnosis not present

## 2015-08-23 ENCOUNTER — Other Ambulatory Visit: Payer: Self-pay | Admitting: *Deleted

## 2015-08-23 MED ORDER — PROPAFENONE HCL 150 MG PO TABS: 150.0000 mg | ORAL_TABLET | Freq: Two times a day (BID) | ORAL | Status: DC

## 2015-08-23 MED ORDER — APIXABAN 5 MG PO TABS: 5.0000 mg | ORAL_TABLET | Freq: Two times a day (BID) | ORAL | Status: DC

## 2015-08-31 DIAGNOSIS — L570 Actinic keratosis: Secondary | ICD-10-CM | POA: Diagnosis not present

## 2015-08-31 DIAGNOSIS — Z411 Encounter for cosmetic surgery: Secondary | ICD-10-CM | POA: Diagnosis not present

## 2015-10-18 ENCOUNTER — Other Ambulatory Visit: Payer: Self-pay | Admitting: *Deleted

## 2015-10-18 MED ORDER — AMLODIPINE BESYLATE 2.5 MG PO TABS: 2.5000 mg | ORAL_TABLET | Freq: Every day | ORAL | Status: DC

## 2015-10-18 MED ORDER — ATENOLOL 25 MG PO TABS: 25.0000 mg | ORAL_TABLET | Freq: Every day | ORAL | Status: DC

## 2015-12-13 ENCOUNTER — Encounter: Payer: Self-pay | Admitting: Cardiovascular Disease

## 2015-12-21 ENCOUNTER — Encounter: Payer: Self-pay | Admitting: *Deleted

## 2015-12-21 ENCOUNTER — Encounter: Payer: Self-pay | Admitting: Cardiovascular Disease

## 2015-12-21 DIAGNOSIS — Z006 Encounter for examination for normal comparison and control in clinical research program: Secondary | ICD-10-CM

## 2015-12-21 NOTE — Progress Notes (Signed)
Late entry:  Subject met inclusion and exclusion criteria. The informed consent form, study requirements and expectations were reviewed with the subject and questions and concerns were addressed prior to the signing of the consent form. The subject verbalized understanding of the trail requirements. The subject agreed to participate in the CLEAR trial and signed the informed consent. The informed consent was obtained prior to performance of any protocol-specific procedures for the subject. A copy of the signed informed consent was given to the subject and a copy was placed in the subject's medical record.  Jake Bathe, RN 12/14/2015 1015 am

## 2015-12-22 DIAGNOSIS — Z1231 Encounter for screening mammogram for malignant neoplasm of breast: Secondary | ICD-10-CM | POA: Diagnosis not present

## 2015-12-22 DIAGNOSIS — Z803 Family history of malignant neoplasm of breast: Secondary | ICD-10-CM | POA: Diagnosis not present

## 2015-12-23 ENCOUNTER — Other Ambulatory Visit: Payer: Self-pay | Admitting: *Deleted

## 2015-12-23 MED ORDER — AMBULATORY NON FORMULARY MEDICATION: 180.0000 mg | Freq: Every day | Status: DC

## 2016-01-06 ENCOUNTER — Encounter: Payer: Self-pay | Admitting: Certified Nurse Midwife

## 2016-01-28 ENCOUNTER — Encounter: Payer: Self-pay | Admitting: Cardiovascular Disease

## 2016-02-17 DIAGNOSIS — Z23 Encounter for immunization: Secondary | ICD-10-CM | POA: Diagnosis not present

## 2016-02-25 ENCOUNTER — Telehealth: Payer: Self-pay | Admitting: Internal Medicine

## 2016-02-25 ENCOUNTER — Ambulatory Visit (INDEPENDENT_AMBULATORY_CARE_PROVIDER_SITE_OTHER): Payer: Medicare Other | Admitting: Gastroenterology

## 2016-02-25 ENCOUNTER — Encounter: Payer: Self-pay | Admitting: Gastroenterology

## 2016-02-25 VITALS — BP 134/76 | HR 56 | Ht 60.0 in | Wt 141.0 lb

## 2016-02-25 DIAGNOSIS — I2510 Atherosclerotic heart disease of native coronary artery without angina pectoris: Secondary | ICD-10-CM

## 2016-02-25 DIAGNOSIS — Z7902 Long term (current) use of antithrombotics/antiplatelets: Secondary | ICD-10-CM | POA: Diagnosis not present

## 2016-02-25 DIAGNOSIS — Z86010 Personal history of colonic polyps: Secondary | ICD-10-CM

## 2016-02-25 MED ORDER — NA SULFATE-K SULFATE-MG SULF 17.5-3.13-1.6 GM/177ML PO SOLN: 1.0000 | Freq: Once | ORAL | 0 refills | Status: AC

## 2016-02-25 NOTE — Patient Instructions (Addendum)
You have been scheduled for a colonoscopy. Please follow written instructions given to you at your visit today.  Please pick up your prep supplies at the pharmacy within the next 1-3 days. [facility] [place], [place].  If you use inhalers (even only as needed), please bring them with you on the day of your procedure. Your physician has requested that you go to www.startemmi.com and enter the access code given to you at your visit today. This web site gives a general overview about your procedure. However, you should still follow specific instructions given to you by our office regarding your preparation for the procedure.

## 2016-02-25 NOTE — Telephone Encounter (Signed)
LMTCB x 1 

## 2016-02-25 NOTE — Progress Notes (Signed)
02/25/2016 Shannon Cummings [id number] August 29, 1945   HISTORY OF PRESENT ILLNESS:  This is a 70 year old female who is known to Dr. Fuller Plan for previous colonoscopy.  Last colonoscopy was in 04/2012 at which time she was found to have mild diverticulosis, moderate internal hemorrhoids, and multiple polyps that were removed and were tubular adenomas and sessile serrated polyps; due to multiple polyps repeat was recommended in 3 years from that time.  She is here today to schedule that.  She is on Eliquis since 2014 after she had a stent placed in her LAD.  Also has PAF.  Sees Dr. Acie Fredrickson yearly; was last seen in 06/2015 and everything was stable.  No GI complaints today.   Past Medical History:  Diagnosis Date  . Anginal pain ([place])   . Arthritis   . CAD (coronary artery disease)    a. prior stenting history. b. NSTEMI s/p DES to prox LAD with EF 45% by cath 08/14/12. c. Mild trop elevation shortly after cath ?mild plaque embolization - patent stent on relook.  . Fatty infiltration of liver   . GERD (gastroesophageal reflux disease)   . Hyperlipidemia   . Hypertension   . Myocardial infarction ([place]) 2014  . OSA on CPAP   . Osteoporosis   . PAF (paroxysmal atrial fibrillation) ([place])    On rythmol.  . Pulmonary nodule    a. 56m subpleural nodular density CT 08/2012, instructed to f/u pulm MD.  . Tubular adenoma of colon 2007   Past Surgical History:  Procedure Laterality Date  . CORONARY ANGIOPLASTY WITH STENT PLACEMENT  08/14/2012   LAD  Dr MSherren Mocha . CORONARY STENT PLACEMENT  2000  . LEFT HEART CATHETERIZATION WITH CORONARY ANGIOGRAM N/A 08/14/2012   Procedure: LEFT HEART CATHETERIZATION WITH CORONARY ANGIOGRAM;  Surgeon: MSherren Mocha MD;  [facility];  Service: Cardiovascular;  Laterality: N/A;  . LEFT HEART CATHETERIZATION WITH CORONARY ANGIOGRAM N/A 08/19/2012   Procedure: LEFT HEART CATHETERIZATION WITH CORONARY ANGIOGRAM;  Surgeon: MSherren Mocha MD;  [facility];  Service: Cardiovascular;  Laterality: N/A;  . ROTATOR CUFF REPAIR    . TONSILLECTOMY    . TOTAL ABDOMINAL HYSTERECTOMY      reports that she has never smoked. She has never used smokeless tobacco. She reports that she drinks alcohol. She reports that she does not use drugs. family history includes Alzheimer's disease in her mother; Breast cancer in her maternal aunt; Heart disease in her father. Allergies  Allergen Reactions  . Other     Intolerance to strong pain medications=  Make her feel anxious and feel like coming out of skin  . Statins Other (See Comments)    Restless legs, bad feeling.  This has occurred with Crestor 5 mg once weekly, Lipitor 10 mg twice weekly, simvastatin qd  . Zetia [Ezetimibe]     Leg aches      Outpatient Encounter Prescriptions as of 02/25/2016  Medication Sig  . acetaminophen (TYLENOL) 325 MG tablet Take 650 mg by mouth as needed.   . AMBULATORY NON FORMULARY MEDICATION CPAP Machine USES AS DIRECTED  . AMBULATORY NON FORMULARY MEDICATION Take 180 mg by mouth daily. Medication Name: bempedoic acid 180 mg vs a placebo.  CLEAR Research Study, drug provided  . amLODipine (NORVASC) 2.5 MG tablet Take 1 tablet (2.5 mg total) by mouth at bedtime.  .Marland Kitchenapixaban (ELIQUIS) 5 MG TABS tablet Take 1 tablet (5 mg total) by mouth 2 (two) times  daily.  . aspirin EC 81 MG tablet Take 81 mg by mouth at bedtime.  Marland Kitchen atenolol (TENORMIN) 25 MG tablet Take 1 tablet (25 mg total) by mouth daily.  . cholecalciferol (VITAMIN D) 1000 UNITS tablet Take 1,000 Units by mouth daily.  Marland Kitchen EPINEPHrine (EPIPEN) 0.3 mg/0.3 mL IJ SOAJ injection Inject 0.3 mLs (0.3 mg total) into the muscle as needed.  . nitroGLYCERIN (NITROSTAT) 0.4 MG SL tablet Place 1 tablet (0.4 mg total) under the tongue every 5 (five) minutes as needed for chest pain (up to 3 doses).  . propafenone (RYTHMOL) 150 MG tablet Take 1 tablet (150 mg total) by mouth 2 (two) times daily.  . Na Sulfate-K Sulfate-Mg Sulf  17.5-3.13-1.6 GM/180ML SOLN Take 1 kit by mouth once.  . [DISCONTINUED] Omega-3 Fatty Acids (FISH OIL) 1000 MG CAPS Take 2 capsules (2,000 mg total) by mouth daily.  . [DISCONTINUED] temazepam (RESTORIL) 15 MG capsule Take 15 mg by mouth at bedtime as needed for sleep.   No facility-administered encounter medications on file as of 02/25/2016.      REVIEW OF SYSTEMS  : All other systems reviewed and negative except where noted in the History of Present Illness.   PHYSICAL EXAM: BP 134/76 (BP Location: Left Arm, Patient Position: Sitting, Cuff Size: Normal)   Pulse (!) 56   Ht 5' (1.524 m) Comment: height measured without shoes  Wt 141 lb (64 kg)   BMI 27.54 kg/m  General: Well developed white female in no acute distress Head: Normocephalic and atraumatic Eyes:  Sclerae anicteric, conjunctive pink. Ears: Normal auditory acuity Lungs: Clear throughout to auscultation Heart: Regular rate and rhythm Abdomen: Soft, non-distended. Normal bowel sounds.  Non-tender. Rectal:  Will be done at the time of colonoscopy. Musculoskeletal: Symmetrical with no gross deformities  Skin: No lesions on visible extremities Extremities: No edema  Neurological: Alert oriented x 4, grossly non-focal Psychological:  Alert and cooperative. Normal mood and affect  ASSESSMENT AND PLAN: -Personal history of colon polyps:  Several polyps in 04/2012 so repeat recommended in 3 years.  Will schedule with Dr. Fuller Plan. -CAD with stent to LAD in 2014 and PAF:  On chronic anticoagulation with Eliquis.  Will hold Eliquis for 2 days prior to endoscopic procedures - will instruct when and how to resume after procedure. Benefits and risks of procedure explained including risks of bleeding, perforation, infection, missed lesions, reactions to medications and possible need for hospitalization and surgery for complications. Additional rare but real risk of stroke or other vascular clotting events off of Eliquis also explained and  need to seek urgent help if any signs of these problems occur. Will communicate by phone or EMR with patient's prescribing provider, Dr. Acie Fredrickson to confirm that holding Eliquis is reasonable in this case.    CC:  No ref. provider found

## 2016-02-25 NOTE — Progress Notes (Signed)
Reviewed and agree with management plan.  Josua Ferrebee T. Bharat Antillon, MD FACG 

## 2016-02-28 NOTE — Telephone Encounter (Signed)
Spoke with pt and she was unaware that she needed yearly follow up to maintain CPAP and supplies. Appt scheduled with CY to re-establish care. Nothing further needed.

## 2016-02-29 ENCOUNTER — Encounter: Payer: Self-pay | Admitting: Cardiovascular Disease

## 2016-03-06 ENCOUNTER — Telehealth: Payer: Self-pay | Admitting: *Deleted

## 2016-03-06 NOTE — Telephone Encounter (Signed)
Called and spoke to the patient . She has stopped the Eliquis as of Sunday 03-05-2016.  She said she had called and spoke to nurse at Dr. Kyla Balzarine office and was told that Eliquis is normally held 2 days prior to procedure date.  I apologized for not sending the letter to Dr. Kyla Balzarine office, it was a mistake on my part.  I told her I will let them know that she held it starting on Sunday 10-1.

## 2016-03-07 ENCOUNTER — Telehealth: Payer: Self-pay | Admitting: *Deleted

## 2016-03-07 ENCOUNTER — Encounter: Payer: Self-pay | Admitting: Gastroenterology

## 2016-03-07 ENCOUNTER — Ambulatory Visit (AMBULATORY_SURGERY_CENTER): Payer: Medicare Other | Admitting: Gastroenterology

## 2016-03-07 VITALS — BP 128/73 | HR 48 | Temp 97.8°F | Resp 14 | Ht 60.0 in | Wt 141.0 lb

## 2016-03-07 DIAGNOSIS — D122 Benign neoplasm of ascending colon: Secondary | ICD-10-CM | POA: Diagnosis not present

## 2016-03-07 DIAGNOSIS — Z86010 Personal history of colonic polyps: Secondary | ICD-10-CM | POA: Diagnosis not present

## 2016-03-07 DIAGNOSIS — D123 Benign neoplasm of transverse colon: Secondary | ICD-10-CM

## 2016-03-07 DIAGNOSIS — Z1211 Encounter for screening for malignant neoplasm of colon: Secondary | ICD-10-CM | POA: Diagnosis not present

## 2016-03-07 HISTORY — PX: COLONOSCOPY: SHX174

## 2016-03-07 MED ORDER — SODIUM CHLORIDE 0.9 % IV SOLN: 500.0000 mL | INTRAVENOUS | Status: DC

## 2016-03-07 NOTE — Telephone Encounter (Signed)
-----   Message from Thayer Headings, MD sent at 03/07/2016  9:19 AM EDT ----- Regarding: RE: Eliquis clearance She may hold the Eliquis ( as she has done) for 2 days prior to procedure.   ----- Message ----- From: Tonette Bihari, CMA Sent: 03/06/2016  12:16 PM To: Thayer Headings, MD Subject: Eliquis clearance                              Dr. Acie Fredrickson,  We have a mutual patient, Shannon Cummings. She is having a colonoscopy on 03-07-2016, tomorrow. I neglected to send you the Eliquis clearance letter, regretfully. I spoke to the patient today and apologized to her. She said she thought she was to stop the Eliquis 2 days prior to the procedure date so she stopped in yesterday 03-05-2016.   I wanted you to know. She does want to have the colonoscopy as scheduled.   Please advise if you don't think she should have the procedure tomorrow.   Marisue Humble CMA [facility]

## 2016-03-07 NOTE — Progress Notes (Signed)
A and O x3. Report to RN. Tolerated MAC anesthesia well. 

## 2016-03-07 NOTE — Op Note (Signed)
[place] Patient Name: Shannon Cummings Procedure Date: 03/07/2016 11:35 AM MRN: [id number] Endoscopist: Ladene Artist , MD Age: 70 Referring MD:  Date of Birth: 1946/04/28 Gender: Female Account #: [id number] Procedure:                Colonoscopy Indications:              Surveillance: Personal history of adenomatous                            polyps on last colonoscopy > 3 years ago Medicines:                Monitored Anesthesia Care Procedure:                Pre-Anesthesia Assessment:                           - Prior to the procedure, a History and Physical                            was performed, and patient medications and                            allergies were reviewed. The patient's tolerance of                            previous anesthesia was also reviewed. The risks                            and benefits of the procedure and the sedation                            options and risks were discussed with the patient.                            All questions were answered, and informed consent                            was obtained. Prior Anticoagulants: The patient has                            taken Eliquis (apixaban). ASA Grade Assessment: III                            - A patient with severe systemic disease. After                            reviewing the risks and benefits, the patient was                            deemed in satisfactory condition to undergo the                            procedure.  After obtaining informed consent, the colonoscope                            was passed under direct vision. Throughout the                            procedure, the patient's blood pressure, pulse, and                            oxygen saturations were monitored continuously. The                            Model PCF-[id number]) scope was introduced                            through the anus and advanced to the the  cecum,                            identified by appendiceal orifice and ileocecal                            valve. The ileocecal valve, appendiceal orifice,                            and rectum were photographed. The quality of the                            bowel preparation was good. The colonoscopy was                            performed without difficulty. The patient tolerated                            the procedure well. Scope In: 11:47:08 AM Scope Out: 12:04:14 PM Scope Withdrawal Time: 0 hours 14 minutes 25 seconds  Total Procedure Duration: 0 hours 17 minutes 6 seconds  Findings:                 The perianal and digital rectal examinations were                            normal.                           A 11 mm polyp was found in the ascending colon. The                            polyp was sessile. The polyp was removed with a hot                            snare. Resection and retrieval were complete.                           Two sessile polyps were found in the transverse  colon and ascending colon. The polyps were 6 to 7                            mm in size. These polyps were removed with a cold                            snare. Resection and retrieval were complete.                           Multiple small-mouthed diverticula were found in                            the sigmoid colon and ascending colon. There was no                            evidence of diverticular bleeding.                           Internal hemorrhoids were found during                            retroflexion. The hemorrhoids were medium-sized and                            Grade I (internal hemorrhoids that do not prolapse).                           The exam was otherwise without abnormality on                            direct and retroflexion views. Complications:            No immediate complications. Estimated blood loss:                             None. Estimated Blood Loss:     Estimated blood loss: none. Impression:               - One 11 mm polyp in the ascending colon, removed                            with a hot snare. Resected and retrieved.                           - Two 6 to 7 mm polyps in the transverse colon and                            in the ascending colon, removed with a cold snare.                            Resected and retrieved.                           - Moderate diverticulosis in the sigmoid colon and  in the ascending colon. There was no evidence of                            diverticular bleeding.                           - Internal hemorrhoids.                           - The examination was otherwise normal on direct                            and retroflexion views. Recommendation:           - Repeat colonoscopy in 3 years for surveillance.                           - Resume Eliquis (apixaban) in 2 days at prior                            dose. Refer to managing physician for further                            adjustment of therapy.                           - Patient has a contact number available for                            emergencies. The signs and symptoms of potential                            delayed complications were discussed with the                            patient. Return to normal activities tomorrow.                            Written discharge instructions were provided to the                            patient.                           - Resume previous diet.                           - Continue present medications.                           - Await pathology results.                           - No aspirin, ibuprofen, naproxen, or other                            non-steroidal anti-inflammatory drugs for 2  weeks                            after polyp removal. Ladene Artist, MD 03/07/2016 12:10:47 PM This report has been signed electronically.

## 2016-03-07 NOTE — Progress Notes (Signed)
No problems noted in the recovery room. maw 

## 2016-03-07 NOTE — Patient Instructions (Signed)
YOU HAD AN ENDOSCOPIC PROCEDURE TODAY AT [facility]:   Refer to the procedure report that was given to you for any specific questions about what was found during the examination.  If the procedure report does not answer your questions, please call your gastroenterologist to clarify.  If you requested that your care partner not be given the details of your procedure findings, then the procedure report has been included in a sealed envelope for you to review at your convenience later.  YOU SHOULD EXPECT: Some feelings of bloating in the abdomen. Passage of more gas than usual.  Walking can help get rid of the air that was put into your GI tract during the procedure and reduce the bloating. If you had a lower endoscopy (such as a colonoscopy or flexible sigmoidoscopy) you may notice spotting of blood in your stool or on the toilet paper. If you underwent a bowel prep for your procedure, you may not have a normal bowel movement for a few days.  Please Note:  You might notice some irritation and congestion in your nose or some drainage.  This is from the oxygen used during your procedure.  There is no need for concern and it should clear up in a day or so.  SYMPTOMS TO REPORT IMMEDIATELY:   Following lower endoscopy (colonoscopy or flexible sigmoidoscopy):  Excessive amounts of blood in the stool  Significant tenderness or worsening of abdominal pains  Swelling of the abdomen that is new, acute  Fever of 100F or higher   Following upper endoscopy (EGD)  Vomiting of blood or coffee ground material  New chest pain or pain under the shoulder blades  Painful or persistently difficult swallowing  New shortness of breath  Fever of 100F or higher  Black, tarry-looking stools  For urgent or emergent issues, a gastroenterologist can be reached at any hour by calling [phone].   DIET:  We do recommend a small meal at first, but then you may proceed to your regular diet.  Drink  plenty of fluids but you should avoid alcoholic beverages for 24 hours.  ACTIVITY:  You should plan to take it easy for the rest of today and you should NOT DRIVE or use heavy machinery until tomorrow (because of the sedation medicines used during the test).    FOLLOW UP: Our staff will call the number listed on your records the next business day following your procedure to check on you and address any questions or concerns that you may have regarding the information given to you following your procedure. If we do not reach you, we will leave a message.  However, if you are feeling well and you are not experiencing any problems, there is no need to return our call.  We will assume that you have returned to your regular daily activities without incident.  If any biopsies were taken you will be contacted by phone or by letter within the next 1-3 weeks.  Please call us at [phone] if you have not heard about the biopsies in 3 weeks.    SIGNATURES/CONFIDENTIALITY: You and/or your care partner have signed paperwork which will be entered into your electronic medical record.  These signatures attest to the fact that that the information above on your After Visit Summary has been reviewed and is understood.  Full responsibility of the confidentiality of this discharge information lies with you and/or your care-partner.   Handouts were given to your care partner on polyps, diverticulosis,  hemorrhoids, and a high fiber diet with liberal fluid intake. You may resume ELIQUIS in two days, on Thursday per Dr. Fuller Plan. No aspirin, aspirin products,  ibuprofen, naproxen, advil, motrin, aleve, or other non-steroidal anti-inflammatory drugs for 14 days after polyp removal. You may resume your other current medications today. Await biopsy results. Please call if any questions or concerns.

## 2016-03-07 NOTE — Progress Notes (Signed)
Called to room for pathology. 

## 2016-03-07 NOTE — Telephone Encounter (Signed)
Received message back from Dr.Philip Nahser. He advised she can hold the Eliquis 2 days prior to the procedure as she has done.

## 2016-03-08 ENCOUNTER — Telehealth: Payer: Self-pay | Admitting: *Deleted

## 2016-03-08 NOTE — Telephone Encounter (Signed)
  Follow up Call-  Call back number 03/07/2016  Post procedure Call Back phone  # [phone]  Permission to leave phone message Yes  Some recent data might be hidden     Patient questions:  Do you have a fever, pain , or abdominal swelling? No. Pain Score  0 *  Have you tolerated food without any problems? Yes.    Have you been able to return to your normal activities? Yes.    Do you have any questions about your discharge instructions: Diet   No. Medications  No. Follow up visit  No.  Do you have questions or concerns about your Care? No.  Actions: * If pain score is 4 or above: No action needed, pain <4.

## 2016-03-20 ENCOUNTER — Encounter: Payer: Self-pay | Admitting: Gastroenterology

## 2016-03-22 ENCOUNTER — Encounter: Payer: Self-pay | Admitting: Internal Medicine

## 2016-04-06 ENCOUNTER — Ambulatory Visit (INDEPENDENT_AMBULATORY_CARE_PROVIDER_SITE_OTHER): Payer: Medicare Other | Admitting: Internal Medicine

## 2016-04-06 ENCOUNTER — Ambulatory Visit (INDEPENDENT_AMBULATORY_CARE_PROVIDER_SITE_OTHER)
Admission: RE | Admit: 2016-04-06 | Discharge: 2016-04-06 | Disposition: A | Discharge status: Home / Self Care | Payer: Medicare Other | Source: Ambulatory Visit | Attending: Internal Medicine | Admitting: Internal Medicine

## 2016-04-06 ENCOUNTER — Encounter: Payer: Self-pay | Admitting: Internal Medicine

## 2016-04-06 ENCOUNTER — Telehealth: Payer: Self-pay | Admitting: Internal Medicine

## 2016-04-06 VITALS — BP 114/60 | HR 52 | Ht 60.0 in | Wt 140.4 lb

## 2016-04-06 DIAGNOSIS — I480 Paroxysmal atrial fibrillation: Secondary | ICD-10-CM | POA: Diagnosis not present

## 2016-04-06 DIAGNOSIS — R911 Solitary pulmonary nodule: Secondary | ICD-10-CM

## 2016-04-06 DIAGNOSIS — G4733 Obstructive sleep apnea (adult) (pediatric): Secondary | ICD-10-CM | POA: Diagnosis not present

## 2016-04-06 DIAGNOSIS — I2510 Atherosclerotic heart disease of native coronary artery without angina pectoris: Secondary | ICD-10-CM | POA: Diagnosis not present

## 2016-04-06 MED ORDER — TEMAZEPAM 15 MG PO CAPS: 15.0000 mg | ORAL_CAPSULE | Freq: Every evening | ORAL | 5 refills | Status: DC | PRN

## 2016-04-06 NOTE — Telephone Encounter (Signed)
Ok to refill temazepam x 6 months

## 2016-04-06 NOTE — Patient Instructions (Addendum)
Order- DME [facility]- Replacement for old CPAP machine, AutoPAP 4-12, mask of choice, humidifier, supplies, AirView   Dx OSA  Order- referral orthodontist Dr Oneal Grout dx OSA  Order CXR    Dx lung nodule  Please call as needed

## 2016-04-06 NOTE — Telephone Encounter (Signed)
CY pt was seen by you this morning and she forgot to ask you for a refill of the temazepam.  Please advise if you can refill this for the pt.  Thanks   Allergies  Allergen Reactions  . Other     Intolerance to strong pain medications=  Make her feel anxious and feel like coming out of skin  . Statins Other (See Comments)    Restless legs, bad feeling.  This has occurred with Crestor 5 mg once weekly, Lipitor 10 mg twice weekly, simvastatin qd  . Zetia [Ezetimibe]     Leg aches

## 2016-04-06 NOTE — Assessment & Plan Note (Signed)
Rhythm feels like regular sinus at this visit

## 2016-04-06 NOTE — Assessment & Plan Note (Addendum)
She should qualify for replacement machine, hopefully smaller for travel. We can change to auto Pap. Comfort issues and compliance goals reviewed. Her quality of life is steadily better with CPAP. We also discussed availability of oral appliance therapy either instead of CPAP or for use while traveling. She is interested in referral to learn more about this option.

## 2016-04-06 NOTE — Telephone Encounter (Signed)
Called and spoke with pt and she is aware of refill that has been called to her pharmacy.  Nothing further is needed.

## 2016-04-06 NOTE — Progress Notes (Signed)
Subjective:    Patient ID: Shannon Cummings, female    DOB: 1946/05/07, 70 y.o.   MRN: [id number]  HPI   10/03/12- 24 yoF never smoker followed for Sleep apnea, complicated by HBP, CAD/ MI, AFib FOLLOWS FOR: follow up per CY based on CXR showing lung nodules-found when had heart attack 08-2012. MI in March, 2014 CPAP 6/ [facility]- good compliance and control. CT chest 08/14/12 IMPRESSION:  No aortic dissection or acute intrathoracic process.  Heart size upper normal to mildly enlarged.  8 mm subpleural nodular density within the right upper lobe. If the  patient is at high risk for bronchogenic carcinoma, follow-up chest  CT at 3-6 months is recommended. If the patient is at low risk for  bronchogenic carcinoma, follow-up chest CT at 6-12 months is  recommended. This recommendation follows the consensus statement:  Guidelines for Management of Small Pulmonary Nodules Detected on CT  Scans: A Statement from the [employer] as published in  Radiology 2005; 237:395-400. CXR 08/16/12 IMPRESSION:  Stable exam. No active disease.  Original Report Authenticated By: Earle Gell, M.D.  04/06/2016-70 year old female never smoker followed for OSA, complicated by HBP, CAD/MI, A. fib, lung nodule NPSG 01/05/10- mod OSA AHI  16.7/hr CPAP 6 [facility] FOLLOWS FOR: re-establishing for OSA. Pt states she wears CPAP "some nights" but not through the whole night. Denies mask or pressure issues. Would like to discuss meds and purchasing CPAP machine rather than rent.  Epworth = 10 CPAP machine is 45 or 69 years old, noisy and inconveniently large. She travels a lot and is interested in a small portable machine Download confirms adequate compliance 72%/4 hours, excellent control AHI 1.5/hour. Old chest x-ray head shown right upper lobe nodule. She asks about recheck. Some secondhand smoke exposure history.  ROS-see HPI Constitutional:   No-   weight loss, night sweats, fevers, chills, fatigue,  lassitude. HEENT:   No-  headaches, difficulty swallowing, tooth/dental problems, sore throat,       No-  sneezing, itching, ear ache, nasal congestion, post nasal drip,  CV:  No-   chest pain, orthopnea, PND, swelling in lower extremities, anasarca, dizziness, palpitations Resp: +  shortness of breath with exertion or at rest.              No-   productive cough,  No non-productive cough,  No- coughing up of blood.              No-   change in color of mucus.  + wheezing.   Skin: No-   rash or lesions. GI:  No-   heartburn, indigestion, abdominal pain, nausea, vomiting,  GU:  MS:  No-   joint pain or swelling.  Neuro-     nothing unusual Psych:  No- change in mood or affect. No depression or anxiety.  No memory loss.  Objective:   Physical Exam General- Alert, Oriented, Affect-appropriate, Distress- none acute, medium build Skin- rash-none, lesions- none, excoriation- none Lymphadenopathy- none Head- atraumatic            Eyes- Gross vision intact, PERRLA, conjunctivae clear secretions            Ears- Hearing, canals            Nose- Clear, no-Septal dev, mucus, polyps, erosion, perforation             Throat- Mallampati III , mucosa clear , drainage- none, tonsils- atrophic Neck- flexible , trachea midline, no stridor , thyroid nl, carotid no bruit  Chest - symmetrical excursion , unlabored           Heart/CV- RRR- , no murmur , no gallop  , no rub, nl s1 s2                                              - JVD- none , edema- none, stasis changes- none, varices- none           Lung- clear to P&A, wheeze- none, cough- none , dullness-none, rub- none           Chest wall-  Abd-  Br/ Gen/ Rectal- Not done, not indicated Extrem- cyanosis- none, clubbing, none, atrophy- none, strength- nl Neuro- grossly intact to observation Assessment & Plan:

## 2016-04-06 NOTE — Assessment & Plan Note (Signed)
Low risk but appropriate to recheck. Plan-CXR

## 2016-04-20 ENCOUNTER — Encounter: Payer: Self-pay | Admitting: Internal Medicine

## 2016-04-20 ENCOUNTER — Telehealth: Payer: Self-pay | Admitting: Internal Medicine

## 2016-04-20 NOTE — Telephone Encounter (Signed)
If she stops using CPAP for any prolonged period- couple months- then insurance may regard that as a lapse and require it be re-ordered, sometimes with a new sleep study.  I think she had better be clear with Dr Ron Parker what she wants to do, and discuss with her insurance how they would handle it.

## 2016-04-20 NOTE — Telephone Encounter (Signed)
LMTCB

## 2016-04-20 NOTE — Telephone Encounter (Signed)
Spoke with the pt  She states that she just got her new CPAP machine and she still can not tolerate it  She states it's not the machine itself, it is the tubing, air pressure and mask "everything about it" She is only able to sleep approx 3 hours using machine  She wants to try the dental appliance and has already seen Dr Ron Parker  She is wanting to return her new CPAP and try the dental appliance She is concerned about insurance not wanting to cover dental appliance since she just got new CPAP  She wants CDY's thoughts on this  Please advise thanks!

## 2016-04-20 NOTE — Telephone Encounter (Signed)
Ok to send order to DME to dc CPAP since she is unable to tolerate it. She can talk with Dr Ron Parker about her insurance coverage for his oral appliance.  I doubt her insurance will have a problem with her stopping CPAP if she is unable to use it.

## 2016-04-20 NOTE — Telephone Encounter (Signed)
Spoke with the pt  She states that does not want order for d/c cpap send until she knows her insurance would cover CPAP again if she tries the oral appliance and doesn't like it either Dr Annamaria Boots, do you know about this, or should she just call her insurance?

## 2016-04-21 NOTE — Telephone Encounter (Signed)
Pt aware of CY recommendations. Pt voice understanding & had no further questions Nothing further needed.

## 2016-05-19 ENCOUNTER — Other Ambulatory Visit: Payer: Self-pay | Admitting: Cardiovascular Disease

## 2016-06-22 ENCOUNTER — Ambulatory Visit: Payer: Medicare Other | Admitting: Family Medicine

## 2016-06-26 DIAGNOSIS — Z23 Encounter for immunization: Secondary | ICD-10-CM | POA: Diagnosis not present

## 2016-06-26 DIAGNOSIS — L820 Inflamed seborrheic keratosis: Secondary | ICD-10-CM | POA: Diagnosis not present

## 2016-06-26 DIAGNOSIS — I781 Nevus, non-neoplastic: Secondary | ICD-10-CM | POA: Diagnosis not present

## 2016-06-26 DIAGNOSIS — L720 Epidermal cyst: Secondary | ICD-10-CM | POA: Diagnosis not present

## 2016-06-26 DIAGNOSIS — L570 Actinic keratosis: Secondary | ICD-10-CM | POA: Diagnosis not present

## 2016-06-28 DIAGNOSIS — N952 Postmenopausal atrophic vaginitis: Secondary | ICD-10-CM | POA: Diagnosis not present

## 2016-06-28 DIAGNOSIS — N898 Other specified noninflammatory disorders of vagina: Secondary | ICD-10-CM | POA: Diagnosis not present

## 2016-06-28 DIAGNOSIS — N9411 Superficial (introital) dyspareunia: Secondary | ICD-10-CM | POA: Diagnosis not present

## 2016-06-29 ENCOUNTER — Ambulatory Visit: Payer: Medicare Other | Admitting: Cardiovascular Disease

## 2016-06-29 ENCOUNTER — Encounter: Payer: Self-pay | Admitting: Cardiovascular Disease

## 2016-06-29 ENCOUNTER — Ambulatory Visit (INDEPENDENT_AMBULATORY_CARE_PROVIDER_SITE_OTHER): Payer: Medicare Other | Admitting: Cardiovascular Disease

## 2016-06-29 VITALS — BP 124/82 | HR 52 | Ht 60.0 in | Wt 140.6 lb

## 2016-06-29 DIAGNOSIS — I480 Paroxysmal atrial fibrillation: Secondary | ICD-10-CM | POA: Diagnosis not present

## 2016-06-29 DIAGNOSIS — I2510 Atherosclerotic heart disease of native coronary artery without angina pectoris: Secondary | ICD-10-CM | POA: Diagnosis not present

## 2016-06-29 MED ORDER — METOPROLOL SUCCINATE ER 25 MG PO TB24: 25.0000 mg | ORAL_TABLET | Freq: Every day | ORAL | 3 refills | Status: DC

## 2016-06-29 MED ORDER — PROPAFENONE HCL 150 MG PO TABS: 150.0000 mg | ORAL_TABLET | Freq: Two times a day (BID) | ORAL | 3 refills | Status: DC

## 2016-06-29 MED ORDER — NITROGLYCERIN 0.4 MG SL SUBL: 0.4000 mg | SUBLINGUAL_TABLET | SUBLINGUAL | 6 refills | Status: DC | PRN

## 2016-06-29 MED ORDER — APIXABAN 5 MG PO TABS: 5.0000 mg | ORAL_TABLET | Freq: Two times a day (BID) | ORAL | 3 refills | Status: DC

## 2016-06-29 NOTE — Patient Instructions (Signed)
Medication Instructions:  STOP Amlodipine STOP Atenolol START Toprol XL (Metoprolol) 25 mg once daily   Labwork: None Ordered   Testing/Procedures: None Ordered   Follow-Up: Your physician wants you to follow-up in: 6 months with Dr. Acie Fredrickson.  You will receive a reminder letter in the mail two months in advance. If you don't receive a letter, please call our office to schedule the follow-up appointment.   If you need a refill on your cardiac medications before your next appointment, please call your pharmacy.   Thank you for choosing [facility]! Christen Bame, RN [phone]

## 2016-06-29 NOTE — Progress Notes (Signed)
Cardiology Office Note   Date:  06/29/2016   ID:  Shannon Cummings, DOB 12-May-1946, MRN [id number]  PCP:  No PCP Per Patient  Cardiologist:   [name], MD   Chief Complaint  Patient presents with  . Coronary Artery Disease   1. Coronary artery disease-status post stenting in the past. She status post stenting of her proximal LAD 08/14/2012 2. Intermittent atrial fibrillation 3.  hyperlipidemia-she has been generally intolerant to most statins  History of Present Illness:  Pt is doing well. No cardiac complaints.   She complains of some tingling and numbness associated with the Crestor. She has discontinued the Crestor because of that reason. She has had reactions to most statins that we have tried.  October 08-2011  She has done well. She has not had any angina or eposides of atrial fib. She has retired and is traveling quite a bit. Her husband has also retired. She has not had any problems.  October 01, 2012:  She is having some bruising ( due to [place]). She is enjoying the cardiac rehab. She has noticed some low BP readings at the end of cardiac rehab. She does not feel bad. These low readings are typically asymptomatic.   No angina. No arrhythmias.   August 20, 2013:  Shannon Cummings is doing ok. She was admitted to the hospital with some CP recently. Rule out for MI.  She has been doing [employer]. Has gained a bit of weight. She is not able to exercise because Of left ankle problems.    August 19, 2014:   Shannon Cummings is a 71 y.o. female who presents for follow up of her atrial fib. Has gained some weight.  She is in the study for lipid management Has had leg cramps since last fall. Off and on  Has had more paroxysmal atrial fib.  Lasts about a minute. Seem to be occuring more frequently.   Jan. 18, 2017:  Shannon Cummings presents for follow up of her CAD and atrial fib.  Doing well.   Has visited [place] recently .   No CP or dyspnea.   Tolerates the  Rythmol.  Talked about her daughter who lives in [place].  Is not having any significant atrial fib issues.   Has been on lipid lowering study drug at [facility]  ( injectable cholesterol medication )   Jan.   25, 2018:  Doing well.   Feeling well Has had a stomach bug last week and now has a URI  - not the flu.   Has maintained NSR .  Is having hair loss.  Her research shows that amlodipine and atenolol can cause this   Past Medical History:  Diagnosis Date  . Anginal pain ([place])   . Arthritis   . CAD (coronary artery disease)    a. prior stenting history. b. NSTEMI s/p DES to prox LAD with EF 45% by cath 08/14/12. c. Mild trop elevation shortly after cath ?mild plaque embolization - patent stent on relook.  . Fatty infiltration of liver   . GERD (gastroesophageal reflux disease)   . Hyperlipidemia   . Hypertension   . Myocardial infarction 2014  . OSA on CPAP   . Osteoporosis   . PAF (paroxysmal atrial fibrillation) ([place])    On rythmol.  . Pulmonary nodule    a. 35mm subpleural nodular density CT 08/2012, instructed to f/u pulm MD.  . Tubular adenoma of colon 2007    Past Surgical History:  Procedure Laterality Date  .  CORONARY ANGIOPLASTY WITH STENT PLACEMENT  08/14/2012   LAD  Dr [name]  . CORONARY STENT PLACEMENT  2000  . LEFT HEART CATHETERIZATION WITH CORONARY ANGIOGRAM N/A 08/14/2012   Procedure: LEFT HEART CATHETERIZATION WITH CORONARY ANGIOGRAM;  Surgeon: [name], MD;  Location: [facility] CATH LAB;  Service: Cardiovascular;  Laterality: N/A;  . LEFT HEART CATHETERIZATION WITH CORONARY ANGIOGRAM N/A 08/19/2012   Procedure: LEFT HEART CATHETERIZATION WITH CORONARY ANGIOGRAM;  Surgeon: [name], MD;  Location: [facility] C[facility] LAB;  Service: Cardiovascular;  Laterality: N/A;  . ROTATOR CUFF REPAIR    . TONSILLECTOMY    . TOTAL ABDOMINAL HYSTERECTOMY       Current Outpatient Prescriptions  Medication Sig Dispense Refill  . acetaminophen (TYLENOL) 325 MG tablet  Take 650 mg by mouth as needed.     . AMBULATORY NON FORMULARY MEDICATION CPAP Machine USES AS DIRECTED    . AMBULATORY NON FORMULARY MEDICATION Take 180 mg by mouth daily. Medication Name: bempedoic acid 180 mg vs a placebo.  CLEAR Research Study, drug provided    . amLODipine (NORVASC) 2.5 MG tablet Take 1 tablet (2.5 mg total) by mouth at bedtime. 90 tablet 2  . apixaban (ELIQUIS) 5 MG TABS tablet Take 1 tablet (5 mg total) by mouth 2 (two) times daily. 180 tablet 2  . aspirin EC 81 MG tablet Take 81 mg by mouth at bedtime.    Marland Kitchen atenolol (TENORMIN) 25 MG tablet Take 1 tablet (25 mg total) by mouth daily. 90 tablet 2  . cholecalciferol (VITAMIN D) 1000 UNITS tablet Take 1,000 Units by mouth daily.    Marland Kitchen EPINEPHrine (EPIPEN) 0.3 mg/0.3 mL IJ SOAJ injection Inject 0.3 mLs (0.3 mg total) into the muscle as needed. 1 Device 1  . nitroGLYCERIN (NITROSTAT) 0.4 MG SL tablet Place 1 tablet (0.4 mg total) under the tongue every 5 (five) minutes as needed for chest pain (up to 3 doses). 25 tablet 6  . propafenone (RYTHMOL) 150 MG tablet Take 1 tablet (150 mg total) by mouth 2 (two) times daily. *Please call and schedule a one year follow up appointment* 180 tablet 0  . temazepam (RESTORIL) 15 MG capsule Take 1 capsule (15 mg total) by mouth at bedtime as needed for sleep. 30 capsule 5   Current Facility-Administered Medications  Medication Dose Route Frequency Provider Last Rate Last Dose  . 0.9 %  sodium chloride infusion  500 mL Intravenous Continuous Ladene Artist, MD        Allergies:   Other; Statins; and Zetia [ezetimibe]    Social History:  The patient  reports that she has never smoked. She has never used smokeless tobacco. She reports that she drinks alcohol. She reports that she does not use drugs.   Family History:  The patient's family history includes Alzheimer's disease in her mother; Breast cancer in her maternal aunt; Heart disease in her father.    ROS:  Please see the history of  present illness.    Review of Systems: Constitutional:  denies fever, chills, diaphoresis, appetite change and fatigue.  HEENT: denies photophobia, eye pain, redness, hearing loss, ear pain, congestion, sore throat, rhinorrhea, sneezing, neck pain, neck stiffness and tinnitus.  Respiratory: denies SOB, DOE, cough, chest tightness, and wheezing.  Cardiovascular: denies chest pain, palpitations and leg swelling.  Gastrointestinal: denies nausea, vomiting, abdominal pain, diarrhea, constipation, blood in stool.  Genitourinary: denies dysuria, urgency, frequency, hematuria, flank pain and difficulty urinating.  Musculoskeletal: denies  myalgias, back pain, joint swelling,  arthralgias and gait problem.   Skin: denies pallor, rash and wound.  Neurological: denies dizziness, seizures, syncope, weakness, light-headedness, numbness and headaches.   Hematological: denies adenopathy, easy bruising, personal or family bleeding history.  Psychiatric/ Behavioral: denies suicidal ideation, mood changes, confusion, nervousness, sleep disturbance and agitation.       All other systems are reviewed and negative.    PHYSICAL EXAM: VS:  BP 124/82 (BP Location: Left Arm, Patient Position: Sitting, Cuff Size: Normal)   Pulse (!) 52   Ht 5' (1.524 m)   Wt 140 lb 9.6 oz (63.8 kg)   BMI 27.46 kg/m  , BMI Body mass index is 27.46 kg/m. GEN: Well nourished, well developed, in no acute distress  HEENT: normal  Neck: no JVD, carotid bruits, or masses Cardiac: RRR; no murmurs, rubs, or gallops,no edema  Respiratory:  clear to auscultation bilaterally, normal work of breathing GI: soft, nontender, nondistended, + BS MS: no deformity or atrophy  Skin: warm and dry, no rash Neuro:  Strength and sensation are intact Psych: normal   EKG:  EKG is ordered today. The ekg ordered today demonstrates sinus brady at 52.  Otherwise normal .    Recent Labs: No results found for requested labs within last 8760  hours.    Lipid Panel    Component Value Date/Time   CHOL 256 (H) 06/23/2015 1153   TRIG 215 (H) 06/23/2015 1153   HDL 44 (L) 06/23/2015 1153   CHOLHDL 5.8 (H) 06/23/2015 1153   VLDL 43 (H) 06/23/2015 1153   LDLCALC 169 (H) 06/23/2015 1153   LDLDIRECT 174.0 12/31/2012 0754      Wt Readings from Last 3 Encounters:  06/29/16 140 lb 9.6 oz (63.8 kg)  04/06/16 140 lb 6.4 oz (63.7 kg)  03/07/16 141 lb (64 kg)      Other studies Reviewed: Additional studies/ records that were reviewed today include: . Review of the above records demonstrates:    ASSESSMENT AND PLAN:  1. Coronary artery disease-status post stenting in the past. She status post stenting of her proximal LAD 08/14/2012. She's not having any episodes of chest pain.  2. Intermittent atrial fibrillation - she's having a little bit more paroxysmal atrial fibrillation. We'll continue the current dose of Rythmol. She's been on Rythmol for years and has done well. This been no evidence of proarrhythmia-despite her having significant coronary artery disease.  Continue Eliquis  5 mg twice a day She's having some hair loss that she things might be due to the atenolol. She notes that amlodipine may also cause some hair loss. We will discontinue the amlodipine and the atenolol and start her on Toprol-XL 25 g a day.  3.  hyperlipidemia-she has been generally intolerant to most statins. She would like to get involved with another [place] lipid study .   I'll see her in 6 months for follow-up visit.   Current medicines are reviewed at length with the patient today.  The patient does not have concerns regarding medicines.  The following changes have been made:  no change  Labs/ tests ordered today include:   No orders of the defined types were placed in this encounter.    Disposition:   FU with me in  6 months.    Signed, [name], MD  06/29/2016 11:46 AM    [address], [place], [place]  [address] Phone: [phone]; Fax: [phone]

## 2016-07-05 ENCOUNTER — Telehealth: Payer: Self-pay | Admitting: Internal Medicine

## 2016-07-05 NOTE — Telephone Encounter (Signed)
Spoke with pt. She needs to make an appointment for CPAP compliance. Pt has been scheduled with TP on 07/17/16. Nothing further was needed.

## 2016-07-05 NOTE — Telephone Encounter (Signed)
Appointment Request From: Shannon Cummings    With Provider: Deneise Lever, MD [facility]]    Preferred Date Range: From 07/18/2016 To 07/21/2016    Preferred Times: Any    Reason for visit: Office Visit    Comments:

## 2016-07-10 ENCOUNTER — Ambulatory Visit (INDEPENDENT_AMBULATORY_CARE_PROVIDER_SITE_OTHER): Payer: Medicare Other | Admitting: Family Medicine

## 2016-07-10 ENCOUNTER — Encounter: Payer: Self-pay | Admitting: Family Medicine

## 2016-07-10 VITALS — BP 121/80 | HR 57 | Temp 98.1°F | Resp 16 | Ht 60.0 in | Wt 139.2 lb

## 2016-07-10 DIAGNOSIS — I480 Paroxysmal atrial fibrillation: Secondary | ICD-10-CM | POA: Diagnosis not present

## 2016-07-10 DIAGNOSIS — E785 Hyperlipidemia, unspecified: Secondary | ICD-10-CM | POA: Diagnosis not present

## 2016-07-10 DIAGNOSIS — K219 Gastro-esophageal reflux disease without esophagitis: Secondary | ICD-10-CM | POA: Insufficient documentation

## 2016-07-10 DIAGNOSIS — I10 Essential (primary) hypertension: Secondary | ICD-10-CM

## 2016-07-10 DIAGNOSIS — I2510 Atherosclerotic heart disease of native coronary artery without angina pectoris: Secondary | ICD-10-CM

## 2016-07-10 DIAGNOSIS — Z23 Encounter for immunization: Secondary | ICD-10-CM

## 2016-07-10 LAB — CBC WITH DIFFERENTIAL/PLATELET
BASOS ABS: 0 10*3/uL (ref 0.0–0.1)
Basophils Relative: 0.5 % (ref 0.0–3.0)
EOS ABS: 0.1 10*3/uL (ref 0.0–0.7)
Eosinophils Relative: 1.9 % (ref 0.0–5.0)
HCT: 44.9 % (ref 36.0–46.0)
Hemoglobin: 15.2 g/dL — ABNORMAL HIGH (ref 12.0–15.0)
LYMPHS ABS: 2.2 10*3/uL (ref 0.7–4.0)
LYMPHS PCT: 33.7 % (ref 12.0–46.0)
MCHC: 33.8 g/dL (ref 30.0–36.0)
MCV: 92.7 fl (ref 78.0–100.0)
Monocytes Absolute: 0.5 10*3/uL (ref 0.1–1.0)
Monocytes Relative: 8.1 % (ref 3.0–12.0)
NEUTROS ABS: 3.6 10*3/uL (ref 1.4–7.7)
NEUTROS PCT: 55.8 % (ref 43.0–77.0)
PLATELETS: 221 10*3/uL (ref 150.0–400.0)
RBC: 4.85 Mil/uL (ref 3.87–5.11)
RDW: 13.8 % (ref 11.5–15.5)
WBC: 6.4 10*3/uL (ref 4.0–10.5)

## 2016-07-10 LAB — BASIC METABOLIC PANEL
BUN: 13 mg/dL (ref 6–23)
CHLORIDE: 100 meq/L (ref 96–112)
CO2: 30 meq/L (ref 19–32)
CREATININE: 0.87 mg/dL (ref 0.40–1.20)
Calcium: 9.1 mg/dL (ref 8.4–10.5)
GFR: 68.41 mL/min (ref >60.00)
Glucose, Bld: 85 mg/dL (ref 70–99)
Potassium: 4 mEq/L (ref 3.5–5.1)
Sodium: 138 mEq/L (ref 135–145)

## 2016-07-10 LAB — TSH: TSH: 2.7 u[IU]/mL (ref 0.35–4.50)

## 2016-07-10 LAB — HEPATIC FUNCTION PANEL
ALK PHOS: 76 U/L (ref 39–117)
ALT: 28 U/L (ref 0–35)
AST: 24 U/L (ref 0–37)
Albumin: 4.4 g/dL (ref 3.5–5.2)
BILIRUBIN DIRECT: 0.1 mg/dL (ref 0.0–0.3)
TOTAL PROTEIN: 7.1 g/dL (ref 6.0–8.3)
Total Bilirubin: 0.7 mg/dL (ref 0.2–1.2)

## 2016-07-10 MED ORDER — PANTOPRAZOLE SODIUM 40 MG PO TBEC: 40.0000 mg | DELAYED_RELEASE_TABLET | Freq: Every day | ORAL | 3 refills | Status: DC

## 2016-07-10 NOTE — Assessment & Plan Note (Signed)
New to provider, ongoing for pt.  Following w/ Dr Acie Fredrickson.  Currently on Rythmol and metoprolol and in NSR today.  Asymptomatic.  On Eliquis for anticoagulation w/o signs of bleeding or bruising.  Will follow along and assist as able.

## 2016-07-10 NOTE — Assessment & Plan Note (Signed)
New.  Pt's continues to have breakthrough sxs despite OTC Omeprazole.  Start Protonix 40mg  daily.  Reviewed lifestyle and dietary modifications.  Will follow.

## 2016-07-10 NOTE — Assessment & Plan Note (Signed)
New to provider, ongoing for pt.  Currently enrolled in a lipid trial through cardiology.  Will not check lipids as this may impact her study participation.  Will follow along.

## 2016-07-10 NOTE — Progress Notes (Signed)
Pre visit review using our clinic review tool, if applicable. No additional management support is needed unless otherwise documented below in the visit note. 

## 2016-07-10 NOTE — Progress Notes (Signed)
   Subjective:    Patient ID: Shannon Cummings, female    DOB: Jan 19, 1946, 71 y.o.   MRN: [id number]  HPI New to establish.  Previous MD-Spears  Cards- Nahser  Pulm- Young  GYN- Key  PAF- chronic problem, on Rythmol and metoprolol for rate control.  On Eliquis for anti-coag.  Denies palpitations, SOB, edema.  Hyperlipidemia- chronic problem, not currently on statin due to intolerance.  Was previously in a 'study for an injectable' but this 'stopped abruptly'.  Is again in a trial, 'a blind study' and is having blood work done routinely.  Next appt is 2/27.  HTN- chronic problem, well controlled on Metoprolol.  Denies CP, SOB, HAs, visual changes, edema.  CAD- s/p stents, on ASA, beta blocker, not on statin.  Denies CP, SOB.  GERD- taking OTC Omeprazole w/ continued breakthrough sxs.   Review of Systems For ROS see HPI     Objective:   Physical Exam  Constitutional: She is oriented to person, place, and time. She appears well-developed and well-nourished. No distress.  HENT:  Head: Normocephalic and atraumatic.  Eyes: Conjunctivae and EOM are normal. Pupils are equal, round, and reactive to light.  Neck: Normal range of motion. Neck supple. No thyromegaly present.  Cardiovascular: Normal rate, regular rhythm, normal heart sounds and intact distal pulses.   No murmur heard. Pulmonary/Chest: Effort normal and breath sounds normal. No respiratory distress.  Abdominal: Soft. She exhibits no distension. There is no tenderness.  Musculoskeletal: She exhibits no edema.  Lymphadenopathy:    She has no cervical adenopathy.  Neurological: She is alert and oriented to person, place, and time.  Skin: Skin is warm and dry.  Psychiatric: She has a normal mood and affect. Her behavior is normal.  Vitals reviewed.         Assessment & Plan:

## 2016-07-10 NOTE — Assessment & Plan Note (Signed)
New to provider, ongoing for pt.  Well controlled today.  Asymptomatic.  Check labs.  No anticipated med changes.  Will follow closely

## 2016-07-10 NOTE — Patient Instructions (Signed)
Schedule your Medicare Wellness Visit with our [facility] Maudie Mercury) in 6 months- and a follow up w/ me around the same time! We'll notify you of your lab results and make any changes if needed Keep up the good work on healthy diet and regular exercise- you look great! Start the Pantoprazole daily for the heartburn/reflux Call with any questions or concerns Welcome!  We're glad to have you!!!

## 2016-07-17 ENCOUNTER — Encounter: Payer: Self-pay | Admitting: Adult Health

## 2016-07-17 ENCOUNTER — Ambulatory Visit (INDEPENDENT_AMBULATORY_CARE_PROVIDER_SITE_OTHER): Payer: Medicare Other | Admitting: Adult Health

## 2016-07-17 DIAGNOSIS — G4733 Obstructive sleep apnea (adult) (pediatric): Secondary | ICD-10-CM

## 2016-07-17 DIAGNOSIS — I2510 Atherosclerotic heart disease of native coronary artery without angina pectoris: Secondary | ICD-10-CM | POA: Diagnosis not present

## 2016-07-17 NOTE — Addendum Note (Signed)
Addended by: Parke Poisson E on: 07/17/2016 10:30 AM   Modules accepted: Orders

## 2016-07-17 NOTE — Patient Instructions (Signed)
May return CPAP machine .  Continue with oral appliance At bedtime  .  Remain active.  Do not drive if sleepy.  Follow up Dr. Annamaria Boots  In 4-6 months and .As needed

## 2016-07-17 NOTE — Progress Notes (Signed)
@Patient  ID: Shannon Cummings, female    DOB: Apr 19, 1946, 71 y.o.   MRN: [id number]  Chief Complaint  Patient presents with  . Follow-up    OSA     Referring provider: No ref. provider found  HPI: 71 yo female never smoker followed for moderate OSA and lung nodule   TEST  NPSG 01/05/10- mod OSA AHI  16.7/h CXR 04/2016 >resolved RUL nodule   07/17/2016 Follow up: OSA  Patient returns for three-month follow-up for moderate sleep apnea. Patient recently restarted her C Pap machine, got new machine  for moderate sleep apnea. Patient says she's been and able to tolerate. She feels that the machine is too strong for her and fights with it all night. . She does not think that she can wear this. She is recently went to Dr . Ron Parker for  an oral appliance. She does feel that this is working better for her. Feels more rested. Keeps it in all night.  Says she did not snore with oral appliance.   Allergies  Allergen Reactions  . Other     Intolerance to strong pain medications=  Make her feel anxious and feel like coming out of skin  . Statins Other (See Comments)    Restless legs, bad feeling.  This has occurred with Crestor 5 mg once weekly, Lipitor 10 mg twice weekly, simvastatin qd  . Zetia [Ezetimibe]     Leg aches    Immunization History  Administered Date(s) Administered  . Influenza Split 03/06/2011, 03/05/2012  . Influenza Whole 04/06/2010  . Influenza, High Dose Seasonal PF 01/04/2016  . Pneumococcal Conjugate-13 07/10/2016  . Pneumococcal Polysaccharide-23 08/15/2012  . Zoster 07/10/2012    Past Medical History:  Diagnosis Date  . Anginal pain ([place])   . Arthritis   . CAD (coronary artery disease)    a. prior stenting history. b. NSTEMI s/p DES to prox LAD with EF 45% by cath 08/14/12. c. Mild trop elevation shortly after cath ?mild plaque embolization - patent stent on relook.  . Fatty infiltration of liver   . GERD (gastroesophageal reflux disease)   . Hyperlipidemia   .  Hypertension   . Myocardial infarction 2014  . OSA on CPAP   . Osteoporosis   . PAF (paroxysmal atrial fibrillation) ([place])    On rythmol.  . Pulmonary nodule    a. 12mm subpleural nodular density CT 08/2012, instructed to f/u pulm MD.  . Tubular adenoma of colon 2007    Tobacco History: History  Smoking Status  . Never Smoker  Smokeless Tobacco  . Never Used   Counseling given: Not Answered   Outpatient Encounter Prescriptions as of 07/17/2016  Medication Sig  . acetaminophen (TYLENOL) 325 MG tablet Take 650 mg by mouth as needed.   . AMBULATORY NON FORMULARY MEDICATION CPAP Machine USES AS DIRECTED  . AMBULATORY NON FORMULARY MEDICATION Take 180 mg by mouth daily. Medication Name: bempedoic acid 180 mg vs a placebo.  CLEAR Research Study, drug provided  . apixaban (ELIQUIS) 5 MG TABS tablet Take 1 tablet (5 mg total) by mouth 2 (two) times daily.  Marland Kitchen aspirin EC 81 MG tablet Take 81 mg by mouth at bedtime.  . cholecalciferol (VITAMIN D) 1000 UNITS tablet Take 1,000 Units by mouth daily.  Marland Kitchen EPINEPHrine (EPIPEN) 0.3 mg/0.3 mL IJ SOAJ injection Inject 0.3 mLs (0.3 mg total) into the muscle as needed.  . metoprolol succinate (TOPROL XL) 25 MG 24 hr tablet Take 1 tablet (25 mg total)  by mouth daily.  . nitroGLYCERIN (NITROSTAT) 0.4 MG SL tablet Place 1 tablet (0.4 mg total) under the tongue every 5 (five) minutes as needed for chest pain (up to 3 doses).  . pantoprazole (PROTONIX) 40 MG tablet Take 1 tablet (40 mg total) by mouth daily.  . propafenone (RYTHMOL) 150 MG tablet Take 1 tablet (150 mg total) by mouth 2 (two) times daily.  . temazepam (RESTORIL) 15 MG capsule Take 1 capsule (15 mg total) by mouth at bedtime as needed for sleep.   No facility-administered encounter medications on file as of 07/17/2016.      Review of Systems  Constitutional:   No  weight loss, night sweats,  Fevers, chills, fatigue, or  lassitude.  HEENT:   No headaches,  Difficulty swallowing,   Tooth/dental problems, or  Sore throat,                No sneezing, itching, ear ache, nasal congestion, post nasal drip,   CV:  No chest pain,  Orthopnea, PND, swelling in lower extremities, anasarca, dizziness, palpitations, syncope.   GI  No heartburn, indigestion, abdominal pain, nausea, vomiting, diarrhea, change in bowel habits, loss of appetite, bloody stools.   Resp: No shortness of breath with exertion or at rest.  No excess mucus, no productive cough,  No non-productive cough,  No coughing up of blood.  No change in color of mucus.  No wheezing.  No chest wall deformity  Skin: no rash or lesions.  GU: no dysuria, change in color of urine, no urgency or frequency.  No flank pain, no hematuria   MS:  No joint pain or swelling.  No decreased range of motion.  No back pain.    Physical Exam  BP 134/74 (BP Location: Left Arm, Cuff Size: Normal)   Pulse (!) 59   Ht 5' (1.524 m)   Wt 140 lb 9.6 oz (63.8 kg)   SpO2 100%   BMI 27.46 kg/m   GEN: A/Ox3; pleasant , NAD, well nourished    HEENT:  /AT,  EACs-clear, TMs-wnl, NOSE-clear, THROAT-clear, no lesions, no postnasal drip or exudate noted.  Class 2 MP airway   NECK:  Supple w/ fair ROM; no JVD; normal carotid impulses w/o bruits; no thyromegaly or nodules palpated; no lymphadenopathy.    RESP  Clear  P & A; w/o, wheezes/ rales/ or rhonchi. no accessory muscle use, no dullness to percussion  CARD:  RRR, no m/r/g, no peripheral edema, pulses intact, no cyanosis or clubbing.  GI:   Soft & nt; nml bowel sounds; no organomegaly or masses detected.   Musco: Warm bil, no deformities or joint swelling noted.   Neuro: alert, no focal deficits noted.    Skin: Warm, no lesions or rashes    Lab Results:No results found.   Assessment & Plan:   Obstructive sleep apnea Moderate OSA unable to tolerate CPAP  Doing well on oral appliance  Advised on OSA sx/untreated OSA complications   Plan  Patient Instructions  May  return CPAP machine .  Continue with oral appliance At bedtime  .  Remain active.  Do not drive if sleepy.  Follow up Dr. Annamaria Boots  In 4-6 months and .As needed         Rexene Edison, NP 07/17/2016

## 2016-07-17 NOTE — Assessment & Plan Note (Signed)
Moderate OSA unable to tolerate CPAP  Doing well on oral appliance  Advised on OSA sx/untreated OSA complications   Plan  Patient Instructions  May return CPAP machine .  Continue with oral appliance At bedtime  .  Remain active.  Do not drive if sleepy.  Follow up Dr. Annamaria Boots  In 4-6 months and .As needed

## 2016-09-12 ENCOUNTER — Encounter: Payer: Self-pay | Admitting: Family Medicine

## 2016-09-12 ENCOUNTER — Ambulatory Visit (INDEPENDENT_AMBULATORY_CARE_PROVIDER_SITE_OTHER): Payer: Medicare Other | Admitting: Family Medicine

## 2016-09-12 VITALS — BP 126/72 | HR 56 | Temp 98.1°F | Resp 17 | Ht 60.0 in | Wt 141.4 lb

## 2016-09-12 DIAGNOSIS — H6983 Other specified disorders of Eustachian tube, bilateral: Secondary | ICD-10-CM | POA: Diagnosis not present

## 2016-09-12 DIAGNOSIS — I2510 Atherosclerotic heart disease of native coronary artery without angina pectoris: Secondary | ICD-10-CM | POA: Diagnosis not present

## 2016-09-12 DIAGNOSIS — H6123 Impacted cerumen, bilateral: Secondary | ICD-10-CM | POA: Diagnosis not present

## 2016-09-12 MED ORDER — FLUTICASONE PROPIONATE 50 MCG/ACT NA SUSP: 2.0000 | Freq: Every day | NASAL | 6 refills | Status: DC

## 2016-09-12 NOTE — Progress Notes (Signed)
   Subjective:    Patient ID: Shannon Cummings, female    DOB: 29-Sep-1945, 71 y.o.   MRN: [id number]  HPI Bilateral ear pain- 'a dull sort of ache'.  Pt reports hx of wax buildup requiring removal.  Pt reports worsening hearing 'for the last few months'.  No drainage from ears.  No fevers.  Denies nasal congestion but 'a lot of drainage'.  Aching started 2-3 months ago.   Review of Systems For ROS see HPI     Objective:   Physical Exam  Constitutional: She appears well-developed and well-nourished. No distress.  HENT:  Head: Normocephalic and atraumatic.  Right Ear: Tympanic membrane is retracted.  Left Ear: Tympanic membrane is retracted.  Nose: Mucosal edema and rhinorrhea present. Right sinus exhibits no maxillary sinus tenderness and no frontal sinus tenderness. Left sinus exhibits no maxillary sinus tenderness and no frontal sinus tenderness.  Mouth/Throat: Mucous membranes are normal. Posterior oropharyngeal erythema (w/ PND) present.  TMs initially obscured by wax but this was successfully removed via curette and TMs retracted bilaterally  Eyes: Conjunctivae and EOM are normal. Pupils are equal, round, and reactive to light.  Neck: Normal range of motion. Neck supple.  Cardiovascular: Normal rate, regular rhythm and normal heart sounds.   Pulmonary/Chest: Effort normal and breath sounds normal. No respiratory distress. She has no wheezes. She has no rales.  Lymphadenopathy:    She has no cervical adenopathy.  Vitals reviewed.         Assessment & Plan:  Cerumen impaction w/ hearing loss bilaterally- wax successfully removed in office but hearing only mildly improved.  I suspect this is due to her eustachian tube dysfxn.  Start nasal steroid spray.  If no improvement, will need ENT/Audiology evaluation.  Reviewed supportive care and red flags that should prompt return.  Pt expressed understanding and is in agreement w/ plan.

## 2016-09-12 NOTE — Progress Notes (Signed)
Pre visit review using our clinic review tool, if applicable. No additional management support is needed unless otherwise documented below in the visit note. 

## 2016-09-12 NOTE — Patient Instructions (Signed)
Follow up by phone or MyChart in 7-10 days to let me know how the hearing is doing Start the Flonase- 2 sprays each nostril daily Drink plenty of fluids Call with any questions or concerns Hang in there!!!

## 2016-11-27 DIAGNOSIS — L719 Rosacea, unspecified: Secondary | ICD-10-CM | POA: Diagnosis not present

## 2016-11-27 DIAGNOSIS — L720 Epidermal cyst: Secondary | ICD-10-CM | POA: Diagnosis not present

## 2016-11-27 DIAGNOSIS — L219 Seborrheic dermatitis, unspecified: Secondary | ICD-10-CM | POA: Diagnosis not present

## 2016-11-27 DIAGNOSIS — L570 Actinic keratosis: Secondary | ICD-10-CM | POA: Diagnosis not present

## 2016-11-27 DIAGNOSIS — L820 Inflamed seborrheic keratosis: Secondary | ICD-10-CM | POA: Diagnosis not present

## 2016-12-14 ENCOUNTER — Ambulatory Visit (INDEPENDENT_AMBULATORY_CARE_PROVIDER_SITE_OTHER): Payer: Medicare Other | Admitting: Orthopedic Surgery

## 2016-12-14 ENCOUNTER — Ambulatory Visit (INDEPENDENT_AMBULATORY_CARE_PROVIDER_SITE_OTHER): Payer: Medicare Other

## 2016-12-14 DIAGNOSIS — M25512 Pain in left shoulder: Secondary | ICD-10-CM | POA: Diagnosis not present

## 2016-12-15 ENCOUNTER — Encounter (INDEPENDENT_AMBULATORY_CARE_PROVIDER_SITE_OTHER): Payer: Self-pay | Admitting: Orthopedic Surgery

## 2016-12-15 DIAGNOSIS — M25512 Pain in left shoulder: Secondary | ICD-10-CM | POA: Diagnosis not present

## 2016-12-15 MED ORDER — LIDOCAINE HCL 1 % IJ SOLN
5.0000 mL | INTRAMUSCULAR | Status: AC | PRN
  Administered 2016-12-15: 5 mL

## 2016-12-15 MED ORDER — BUPIVACAINE HCL 0.5 % IJ SOLN
9.0000 mL | INTRAMUSCULAR | Status: AC | PRN
  Administered 2016-12-15: 9 mL via INTRA_ARTICULAR

## 2016-12-15 MED ORDER — METHYLPREDNISOLONE ACETATE 40 MG/ML IJ SUSP
40.0000 mg | INTRAMUSCULAR | Status: AC | PRN
  Administered 2016-12-15: 40 mg via INTRA_ARTICULAR

## 2016-12-15 NOTE — Progress Notes (Signed)
Office Visit Note   Patient: Shannon Cummings           Date of Birth: [birth year]           MRN: [id number] Visit Date: 12/14/2016 Requested by: [name], MD [address] [place], [place] [address] PCP: [name], MD  Subjective: Chief Complaint  Patient presents with  . Left Shoulder - Pain    HPI: Shannon Cummings is a 71 year old female with left shoulder pain.  Been going on 34 months.  Denies any history of injury.  She is right-hand dominant.  Many years ago she had right shoulder rotator cuff tear repair.  At times she has decreased range of motion with soreness and tenderness to touch but it's been slightly better recently.  Will occasionally wake her from sleep at night due to pain.  Localizes the pain to the deltoid region.  No mechanical symptoms in her neck symptoms.  Localizes the pain primarily anteriorly.              ROS: All systems reviewed are negative as they relate to the chief complaint within the history of present illness.  Patient denies  fevers or chills.   Assessment & Plan: Visit Diagnoses:  1. Acute pain of left shoulder     Plan: Impression is left shoulder pain possible bursitis versus rotator cuff pathology.  I like to try an injection into that subacromial space with 6 week return.  I like her to rest for 4 weeks and then do regular activity for 2 weeks and we can see if her pain recurs to the point where we need to do MRI scanning.  I looked at her shoulder today under ultrasound and the superior rotator cuff looked okay.  Potentially partial-thickness tearing of the subscap is present which would account for some of her anterior symptoms but dynamically the subscap is functional the biceps tendon remains located.  Follow-Up Instructions: No Follow-up on file.   Orders:  Orders Placed This Encounter  Procedures  . XR Shoulder Left   No orders of the defined types were placed in this encounter.     Procedures: Large Joint  Inj Date/Time: 12/15/2016 11:06 AM Performed by: Meredith Pel Authorized by: Meredith Pel   Consent Given by:  Patient Site marked: the procedure site was marked   Timeout: prior to procedure the correct patient, procedure, and site was verified   Indications:  Pain and diagnostic evaluation Location:  Shoulder Site:  L subacromial bursa Prep: patient was prepped and draped in usual sterile fashion   Needle Size:  18 G Needle Length:  1.5 inches Approach:  Posterior Ultrasound Guidance: No   Fluoroscopic Guidance: No   Arthrogram: No   Medications:  5 mL lidocaine 1 %; 9 mL bupivacaine 0.5 %; 40 mg methylPREDNISolone acetate 40 MG/ML Aspiration Attempted: No   Patient tolerance:  Patient tolerated the procedure well with no immediate complications     Clinical Data: No additional findings.  Objective: Vital Signs: There were no vitals taken for this visit.  Physical Exam:   Constitutional: Patient appears well-developed HEENT:  Head: Normocephalic Eyes:EOM are normal Neck: Normal range of motion Cardiovascular: Normal rate Pulmonary/chest: Effort normal Neurologic: Patient is alert Skin: Skin is warm Psychiatric: Patient has normal mood and affect    Ortho Exam: Orthopedic exam demonstrates good cervical spine range of motion full active and passive range of motion of the left shoulder with some pain  with resisted subscap testing.  Rotator cuff strength is otherwise intact.  Impingement signs negative.  No acromial clavicular joint tenderness is present.  No other masses lymph adenopathy or skin changes noted in the shoulder region  Specialty Comments:  No specialty comments available.  Imaging: No results found.   PMFS History: Patient Active Problem List   Diagnosis Date Noted  . GERD (gastroesophageal reflux disease) 07/10/2016  . History of colonic polyps 02/25/2016  . Antiplatelet or antithrombotic long-term use 02/25/2016  . Nodule of  right lung 10/13/2012  . NSTEMI (non-ST elevated myocardial infarction) ([place]) 08/14/2012  . Hepatic steatosis 12/03/2011  . Paroxysmal a-fib ([place]) 12/22/2010  . Coronary artery disease- LAD DES 3/14 12/22/2010  . INSOMNIA 04/13/2010  . Hyperlipidemia 03/19/2010  . Essential hypertension 03/19/2010  . Obstructive sleep apnea 03/17/2010   Past Medical History:  Diagnosis Date  . Anginal pain ([place])   . Arthritis   . CAD (coronary artery disease)    a. prior stenting history. b. NSTEMI s/p DES to prox LAD with EF 45% by cath 08/14/12. c. Mild trop elevation shortly after cath ?mild plaque embolization - patent stent on relook.  . Fatty infiltration of liver   . GERD (gastroesophageal reflux disease)   . Hyperlipidemia   . Hypertension   . Myocardial infarction ([place]) 2014  . OSA on CPAP   . Osteoporosis   . PAF (paroxysmal atrial fibrillation) ([place])    On rythmol.  . Pulmonary nodule    a. 83mm subpleural nodular density CT 08/2012, instructed to f/u pulm MD.  . Tubular adenoma of colon 2007    Family History  Problem Relation Age of Onset  . Heart disease Father   . Alzheimer's disease Mother   . Breast cancer Maternal Aunt   . Colon cancer Neg Hx     Past Surgical History:  Procedure Laterality Date  . CORONARY ANGIOPLASTY WITH STENT PLACEMENT  08/14/2012   LAD  Dr [name]  . CORONARY STENT PLACEMENT  2000  . LEFT HEART CATHETERIZATION WITH CORONARY ANGIOGRAM N/A 08/14/2012   Procedure: LEFT HEART CATHETERIZATION WITH CORONARY ANGIOGRAM;  Surgeon: [name], MD;  [facility] C[facility] LAB;  Service: Cardiovascular;  Laterality: N/A;  . LEFT HEART CATHETERIZATION WITH CORONARY ANGIOGRAM N/A 08/19/2012   Procedure: LEFT HEART CATHETERIZATION WITH CORONARY ANGIOGRAM;  Surgeon: [name], MD;  Location: [facility] CATH LAB;  Service: Cardiovascular;  Laterality: N/A;  . ROTATOR CUFF REPAIR    . TONSILLECTOMY    . TOTAL ABDOMINAL HYSTERECTOMY     Social History    Occupational History  . Retired [employer]     works part time in [employer]   Social History Main Topics  . Smoking status: Never Smoker  . Smokeless tobacco: Never Used  . Alcohol use Yes     Comment: 2 drinks daily   . Drug use: No  . Sexual activity: Not Currently

## 2017-01-05 ENCOUNTER — Encounter (HOSPITAL_COMMUNITY): Payer: Self-pay | Admitting: *Deleted

## 2017-01-05 ENCOUNTER — Emergency Department (HOSPITAL_COMMUNITY): Payer: Medicare Other

## 2017-01-05 ENCOUNTER — Emergency Department (HOSPITAL_COMMUNITY)
Admission: EM | Admit: 2017-01-05 | Discharge: 2017-01-05 | Disposition: A | Discharge status: Home / Self Care | Payer: Medicare Other | Attending: Emergency Medicine | Admitting: Emergency Medicine

## 2017-01-05 DIAGNOSIS — I2510 Atherosclerotic heart disease of native coronary artery without angina pectoris: Secondary | ICD-10-CM | POA: Diagnosis not present

## 2017-01-05 DIAGNOSIS — Z7982 Long term (current) use of aspirin: Secondary | ICD-10-CM | POA: Insufficient documentation

## 2017-01-05 DIAGNOSIS — M545 Low back pain: Secondary | ICD-10-CM | POA: Diagnosis not present

## 2017-01-05 DIAGNOSIS — M546 Pain in thoracic spine: Secondary | ICD-10-CM | POA: Insufficient documentation

## 2017-01-05 DIAGNOSIS — R0789 Other chest pain: Secondary | ICD-10-CM | POA: Diagnosis not present

## 2017-01-05 DIAGNOSIS — R079 Chest pain, unspecified: Secondary | ICD-10-CM | POA: Insufficient documentation

## 2017-01-05 DIAGNOSIS — Z7901 Long term (current) use of anticoagulants: Secondary | ICD-10-CM | POA: Insufficient documentation

## 2017-01-05 DIAGNOSIS — Z955 Presence of coronary angioplasty implant and graft: Secondary | ICD-10-CM | POA: Insufficient documentation

## 2017-01-05 DIAGNOSIS — Z79899 Other long term (current) drug therapy: Secondary | ICD-10-CM | POA: Insufficient documentation

## 2017-01-05 DIAGNOSIS — I10 Essential (primary) hypertension: Secondary | ICD-10-CM

## 2017-01-05 LAB — BASIC METABOLIC PANEL
ANION GAP: 8 (ref 5–15)
BUN: 11 mg/dL (ref 6–20)
CHLORIDE: 102 mmol/L (ref 101–111)
CO2: 26 mmol/L (ref 22–32)
Calcium: 9.1 mg/dL (ref 8.9–10.3)
Creatinine, Ser: 0.91 mg/dL (ref 0.44–1.00)
GFR calc Af Amer: >60 mL/min (ref >60)
GLUCOSE: 97 mg/dL (ref 65–99)
POTASSIUM: 4.1 mmol/L (ref 3.5–5.1)
Sodium: 136 mmol/L (ref 135–145)

## 2017-01-05 LAB — I-STAT TROPONIN, ED
Troponin i, poc: 0 ng/mL (ref 0.00–0.08)
Troponin i, poc: 0 ng/mL (ref 0.00–0.08)

## 2017-01-05 LAB — CBC
HEMATOCRIT: 44.5 % (ref 36.0–46.0)
HEMOGLOBIN: 14.9 g/dL (ref 12.0–15.0)
MCH: 30.7 pg (ref 26.0–34.0)
MCHC: 33.5 g/dL (ref 30.0–36.0)
MCV: 91.6 fL (ref 78.0–100.0)
Platelets: 203 10*3/uL (ref 150–400)
RBC: 4.86 MIL/uL (ref 3.87–5.11)
RDW: 13.9 % (ref 11.5–15.5)
WBC: 6.8 10*3/uL (ref 4.0–10.5)

## 2017-01-05 MED ORDER — LABETALOL HCL 5 MG/ML IV SOLN
20.0000 mg | Freq: Once | INTRAVENOUS | Status: AC
  Administered 2017-01-05: 20 mg via INTRAVENOUS
  Filled 2017-01-05: qty 4

## 2017-01-05 MED ORDER — IOPAMIDOL (ISOVUE-370) INJECTION 76%
INTRAVENOUS | Status: AC
Start: 2017-01-05 — End: 2017-01-05
  Administered 2017-01-05: 100 mL
  Filled 2017-01-05: qty 100

## 2017-01-05 MED ORDER — METOPROLOL SUCCINATE ER 50 MG PO TB24: 50.0000 mg | ORAL_TABLET | Freq: Every day | ORAL | 1 refills | Status: DC

## 2017-01-05 NOTE — ED Notes (Signed)
Pt voices understanding of discharge instructions, NAD at departure. A/o x4.

## 2017-01-05 NOTE — ED Notes (Signed)
Pt to CT

## 2017-01-05 NOTE — ED Notes (Signed)
Patient transported to X-ray 

## 2017-01-05 NOTE — Discharge Instructions (Signed)
The testing today, is reassuring.  We are changing your blood pressure medication, to lower the pressure.  Follow-up with your PCP, or cardiologist, for a checkup in 5-7 days, sooner as needed, for problems.

## 2017-01-05 NOTE — ED Notes (Signed)
1x unsuccessful IV attempt, L forearm.

## 2017-01-05 NOTE — ED Triage Notes (Signed)
Pt reports onset of back pain and tightness that started around noon. Pt then began having sob and pain into her chest. Has cardiac hx. No resp distress is noted at triage, ekg done.

## 2017-01-06 NOTE — ED Provider Notes (Signed)
[facility] DEPT Provider Note   CSN: [id number] Arrival date & time: 01/05/17  1338     History   Chief Complaint Chief Complaint  Patient presents with  . Shortness of Breath  . Back Pain  . Chest Pain    HPI Shannon Cummings is a 71 y.o. female.  She presents for evaluation of chest tightness, described as "tight feeling." Onset of symptoms, several hours prior to arrival. She also is worried that her blood pressure, is high. She is taking her usual medications, without relief. She denies shortness of breath, nausea, vomiting, diaphoresis, weakness or dizziness. History of similar discomfort prior to requiring cardiac stenting, last year. She is taking her usual medications. There are no other known modifying factors. Marland Kitchen  HPI  Past Medical History:  Diagnosis Date  . Anginal pain ([place])   . Arthritis   . CAD (coronary artery disease)    a. prior stenting history. b. NSTEMI s/p DES to prox LAD with EF 45% by cath 08/14/12. c. Mild trop elevation shortly after cath ?mild plaque embolization - patent stent on relook.  . Fatty infiltration of liver   . GERD (gastroesophageal reflux disease)   . Hyperlipidemia   . Hypertension   . Myocardial infarction ([place]) 2014  . OSA on CPAP   . Osteoporosis   . PAF (paroxysmal atrial fibrillation) ([place])    On rythmol.  . Pulmonary nodule    a. 67mm subpleural nodular density CT 08/2012, instructed to f/u pulm MD.  . Tubular adenoma of colon 2007    Patient Active Problem List   Diagnosis Date Noted  . GERD (gastroesophageal reflux disease) 07/10/2016  . History of colonic polyps 02/25/2016  . Antiplatelet or antithrombotic long-term use 02/25/2016  . Nodule of right lung 10/13/2012  . NSTEMI (non-ST elevated myocardial infarction) ([place]) 08/14/2012  . Hepatic steatosis 12/03/2011  . Paroxysmal a-fib ([place]) 12/22/2010  . Coronary artery disease- LAD DES 3/14 12/22/2010  . INSOMNIA 04/13/2010  . Hyperlipidemia 03/19/2010  .  Essential hypertension 03/19/2010  . Obstructive sleep apnea 03/17/2010    Past Surgical History:  Procedure Laterality Date  . CORONARY ANGIOPLASTY WITH STENT PLACEMENT  08/14/2012   LAD  Dr [name]  . CORONARY STENT PLACEMENT  2000  . LEFT HEART CATHETERIZATION WITH CORONARY ANGIOGRAM N/A 08/14/2012   Procedure: LEFT HEART CATHETERIZATION WITH CORONARY ANGIOGRAM;  Surgeon: [name], MD;  Location: [facility] CATH LAB;  Service: Cardiovascular;  Laterality: N/A;  . LEFT HEART CATHETERIZATION WITH CORONARY ANGIOGRAM N/A 08/19/2012   Procedure: LEFT HEART CATHETERIZATION WITH CORONARY ANGIOGRAM;  Surgeon: [name], MD;  Location: [facility] CATH LAB;  Service: Cardiovascular;  Laterality: N/A;  . ROTATOR CUFF REPAIR    . TONSILLECTOMY    . TOTAL ABDOMINAL HYSTERECTOMY      OB History    No data available       Home Medications    Prior to Admission medications   Medication Sig Start Date End Date Taking? Authorizing Provider  acetaminophen (TYLENOL) 325 MG tablet Take 650 mg by mouth as needed.    Yes [provider]  AMBULATORY NON FORMULARY MEDICATION Take 180 mg by mouth daily. Medication Name: bempedoic acid 180 mg vs a placebo.  CLEAR Research Study, drug provided 12/21/15  Yes Crenshaw, Denice Bors, MD  apixaban (ELIQUIS) 5 MG TABS tablet Take 1 tablet (5 mg total) by mouth 2 (two) times daily. 06/29/16  Yes Nahser, Wonda Cheng, MD  aspirin EC 81 MG tablet Take 81  mg by mouth at bedtime.   Yes [provider]  cholecalciferol (VITAMIN D) 1000 UNITS tablet Take 1,000 Units by mouth daily.   Yes [provider]  fluticasone (FLONASE) 50 MCG/ACT nasal spray Place 2 sprays into both nostrils daily. 09/12/16  Yes [name], MD  pantoprazole (PROTONIX) 40 MG tablet Take 1 tablet (40 mg total) by mouth daily. 07/10/16  Yes [name], MD  propafenone (RYTHMOL) 150 MG tablet Take 1 tablet (150 mg total) by mouth 2 (two) times daily. 06/29/16  Yes  Nahser, Wonda Cheng, MD  temazepam (RESTORIL) 15 MG capsule Take 1 capsule (15 mg total) by mouth at bedtime as needed for sleep. 04/06/16  Yes Young, Tarri Fuller D, MD  EPINEPHrine (EPIPEN) 0.3 mg/0.3 mL IJ SOAJ injection Inject 0.3 mLs (0.3 mg total) into the muscle as needed. Patient not taking: Reported on 09/12/2016 11/21/13   Blanchie Dessert, MD  metoprolol succinate (TOPROL-XL) 50 MG 24 hr tablet Take 1 tablet (50 mg total) by mouth daily. 01/05/17   Daleen Bo, MD  nitroGLYCERIN (NITROSTAT) 0.4 MG SL tablet Place 1 tablet (0.4 mg total) under the tongue every 5 (five) minutes as needed for chest pain (up to 3 doses). Patient not taking: Reported on 09/12/2016 06/29/16   Nahser, Wonda Cheng, MD    Family History Family History  Problem Relation Age of Onset  . Heart disease Father   . Alzheimer's disease Mother   . Breast cancer Maternal Aunt   . Colon cancer Neg Hx     Social History Social History  Substance Use Topics  . Smoking status: Never Smoker  . Smokeless tobacco: Never Used  . Alcohol use Yes     Comment: 2 drinks daily      Allergies   Other; Statins; and Zetia [ezetimibe]   Review of Systems Review of Systems  All other systems reviewed and are negative.    Physical Exam Updated Vital Signs BP (!) 149/93   Pulse (!) 59   Temp (!) 97.5 F (36.4 C) (Oral)   Resp 13   Ht 5' (1.524 m)   Wt 62.6 kg (138 lb)   SpO2 96%   BMI 26.95 kg/m   Physical Exam  Constitutional: She is oriented to person, place, and time. She appears well-developed and well-nourished. She appears distressed (She is uncomfortable.).  HENT:  Head: Normocephalic and atraumatic.  Eyes: Pupils are equal, round, and reactive to light. Conjunctivae and EOM are normal.  Neck: Normal range of motion and phonation normal. Neck supple.  Cardiovascular: Normal rate and regular rhythm.   Pulmonary/Chest: Effort normal and breath sounds normal. She exhibits no tenderness.  Abdominal: Soft. She  exhibits no distension. There is no tenderness. There is no guarding.  Musculoskeletal: Normal range of motion.  Neurological: She is alert and oriented to person, place, and time. She exhibits normal muscle tone.  No dysarthria, or aphasia.  Skin: Skin is warm and dry.  Psychiatric: She has a normal mood and affect. Her behavior is normal. Judgment and thought content normal.  Nursing note and vitals reviewed.    ED Treatments / Results  Labs (all labs ordered are listed, but only abnormal results are displayed) Labs Reviewed  BASIC METABOLIC PANEL  CBC  I-STAT TROPONIN, ED  I-STAT TROPONIN, ED    EKG  EKG Interpretation  Date/Time:  Friday January 05 2017 13:39:05 EDT Ventricular Rate:  57 PR Interval:  216 QRS Duration: 98 QT Interval:  436 QTC  Calculation: 424 R Axis:   -2 Text Interpretation:  Sinus bradycardia with 1st degree A-V block Otherwise normal ECG since last tracing no significant change Confirmed by Daleen Bo [phone]) on 01/05/2017 2:47:05 PM       Radiology Dg Chest 2 View  Result Date: 01/05/2017 CLINICAL DATA:  Upper back and chest pain . EXAM: CHEST  2 VIEW COMPARISON:  04/06/2016 . FINDINGS: Mediastinum and hilar structures normal. Heart size normal. No focal infiltrate. No pleural effusion or pneumothorax. No acute bony abnormality identified . IMPRESSION: No acute cardiopulmonary disease. Electronically Signed   By: Marcello Moores  Register   On: 01/05/2017 14:48   Ct Angio Chest/abd/pel For Dissection W And/or W/wo  Result Date: 01/05/2017 CLINICAL DATA:  Acute chest and back pain. EXAM: CT ANGIOGRAPHY CHEST, ABDOMEN AND PELVIS TECHNIQUE: Multidetector CT imaging through the chest, abdomen and pelvis was performed using the standard protocol during bolus administration of intravenous contrast. Multiplanar reconstructed images and MIPs were obtained and reviewed to evaluate the vascular anatomy. CONTRAST:  100 mL of Isovue 370 intravenously. COMPARISON:  CT  scan of August 14, 2012. FINDINGS: CTA CHEST FINDINGS Cardiovascular: Preferential opacification of the thoracic aorta. No evidence of thoracic aortic aneurysm or dissection. Normal heart size. No pericardial effusion. Stent is noted in left anterior descending artery. Mediastinum/Nodes: No enlarged mediastinal, hilar, or axillary lymph nodes. Thyroid gland, trachea, and esophagus demonstrate no significant findings. Lungs/Pleura: Lungs are clear. No pleural effusion or pneumothorax. Musculoskeletal: No chest wall abnormality. No acute or significant osseous findings. Review of the MIP images confirms the above findings. CTA ABDOMEN AND PELVIS FINDINGS VASCULAR Aorta: Normal caliber aorta without aneurysm, dissection, vasculitis or significant stenosis. Celiac: Patent without evidence of aneurysm, dissection, vasculitis or significant stenosis. SMA: Patent without evidence of aneurysm, dissection, vasculitis or significant stenosis. Renals: Both renal arteries are patent without evidence of aneurysm, dissection, vasculitis, fibromuscular dysplasia or significant stenosis. IMA: Patent without evidence of aneurysm, dissection, vasculitis or significant stenosis. Inflow: Patent without evidence of aneurysm, dissection, vasculitis or significant stenosis. Veins: No obvious venous abnormality within the limitations of this arterial phase study. Review of the MIP images confirms the above findings. NON-VASCULAR Hepatobiliary: No focal liver abnormality is seen. No gallstones, gallbladder wall thickening, or biliary dilatation. Pancreas: Unremarkable. No pancreatic ductal dilatation or surrounding inflammatory changes. Spleen: Normal in size without focal abnormality. Adrenals/Urinary Tract: Adrenal glands are unremarkable. Kidneys are normal, without renal calculi, focal lesion, or hydronephrosis. Bladder is unremarkable. Stomach/Bowel: Stomach is within normal limits. Appendix appears normal. No evidence of bowel wall  thickening, distention, or inflammatory changes. Lymphatic: No significant vascular findings are present. No enlarged abdominal or pelvic lymph nodes. Reproductive: Status post hysterectomy. No adnexal masses. Other: No abdominal wall hernia or abnormality. No abdominopelvic ascites. Musculoskeletal: No acute or significant osseous findings. Review of the MIP images confirms the above findings. IMPRESSION: No evidence of thoracic or abdominal aortic aneurysm or dissection. No acute abnormality is noted in the chest, abdomen or pelvis. Electronically Signed   By: Marijo Conception, M.D.   On: 01/05/2017 16:31    Procedures Procedures (including critical care time)  Medications Ordered in ED Medications  iopamidol (ISOVUE-370) 76 % injection (100 mLs  Contrast Given 01/05/17 1553)  labetalol (NORMODYNE,TRANDATE) injection 20 mg (20 mg Intravenous Given 01/05/17 1711)     Initial Impression / Assessment and Plan / ED Course  I have reviewed the triage vital signs and the nursing notes.  Pertinent labs & imaging results that were  available during my care of the patient were reviewed by me and considered in my medical decision making (see chart for details).  Clinical Course as of Jan 07 1599  Fri Jan 05, 2017  1447 NAD.  Images reviewed by me DG Chest 2 View [EW]    Clinical Course User Index [EW] Daleen Bo, MD     Patient Vitals for the past 24 hrs:  BP Pulse Resp SpO2  01/05/17 1730 (!) 149/93 (!) 59 13 96 %  01/05/17 1725 (!) 142/90 (!) 59 16 96 %  01/05/17 1721 (!) 155/93 (!) 57 14 97 %  01/05/17 1711 (!) 187/95 (!) 52 16 98 %  01/05/17 1630 (!) 177/88 (!) 55 11 95 %  01/05/17 1606 (!) 173/96 63 15 100 %    At D/C- Reevaluation with update and discussion. After initial assessment and treatment, an updated evaluation reveals she is comfortable now with improved blood pressure following treatment with labetalol. Findings discussed with patient and husband, all questions were  answered. Jontae Adebayo L   Final Clinical Impressions(s) / ED Diagnoses   Final diagnoses:  Chest pain, unspecified type  Hypertension, unspecified type  Thoracic back pain, unspecified back pain laterality, unspecified chronicity   Evaluation consistent with nonspecific chest pain, doubt ACS, PE or pneumonia. Incidental hypertension, without signs for any organ damage. Patient has recently had problems with alopecia secondary to use a rather blood pressure medications, so will maintain her on metoprolol, at an increased dose, to help control hypertension. Prescription written for metoprolol XL 50 mg daily  Nursing Notes Reviewed/ Care Coordinated Applicable Imaging Reviewed Interpretation of Laboratory Data incorporated into ED treatment  The patient appears reasonably screened and/or stabilized for discharge and I doubt any other medical condition or other [facility] requiring further screening, evaluation, or treatment in the ED at this time prior to discharge.  Plan: Home Medications- continue current medications; Home Treatments- rest, fluids, low-salt diet; return here if the recommended treatment, does not improve the symptoms; Recommended follow up- PCP checkup one weekend as needed.    New Prescriptions Discharge Medication List as of 01/05/2017  5:51 PM     Allergies as of 01/05/2017      Reactions   Other    Intolerance to strong pain medications=  Make her feel anxious and feel like coming out of skin   Statins Other (See Comments)   Restless legs, bad feeling.  This has occurred with Crestor 5 mg once weekly, Lipitor 10 mg twice weekly, simvastatin qd   Zetia [ezetimibe]    Leg aches      Medication List    TAKE these medications   metoprolol succinate 50 MG 24 hr tablet Commonly known as:  TOPROL XL Take 1 tablet (50 mg total) by mouth daily. What changed:  medication strength  how much to take     ASK your doctor about these medications   acetaminophen 325 MG  tablet Commonly known as:  TYLENOL Take 650 mg by mouth as needed.   AMBULATORY NON FORMULARY MEDICATION Take 180 mg by mouth daily. Medication Name: bempedoic acid 180 mg vs a placebo.  CLEAR Research Study, drug provided   apixaban 5 MG Tabs tablet Commonly known as:  ELIQUIS Take 1 tablet (5 mg total) by mouth 2 (two) times daily.   aspirin EC 81 MG tablet Take 81 mg by mouth at bedtime.   cholecalciferol 1000 units tablet Commonly known as:  VITAMIN D Take 1,000 Units by mouth daily.  EPINEPHrine 0.3 mg/0.3 mL Soaj injection Commonly known as:  EPIPEN Inject 0.3 mLs (0.3 mg total) into the muscle as needed.   fluticasone 50 MCG/ACT nasal spray Commonly known as:  FLONASE Place 2 sprays into both nostrils daily.   nitroGLYCERIN 0.4 MG SL tablet Commonly known as:  NITROSTAT Place 1 tablet (0.4 mg total) under the tongue every 5 (five) minutes as needed for chest pain (up to 3 doses).   pantoprazole 40 MG tablet Commonly known as:  PROTONIX Take 1 tablet (40 mg total) by mouth daily.   propafenone 150 MG tablet Commonly known as:  RYTHMOL Take 1 tablet (150 mg total) by mouth 2 (two) times daily.   temazepam 15 MG capsule Commonly known as:  RESTORIL Take 1 capsule (15 mg total) by mouth at bedtime as needed for sleep.        Daleen Bo, MD 01/07/17 [phone]

## 2017-01-08 ENCOUNTER — Encounter: Payer: Self-pay | Admitting: Physician Assistant

## 2017-01-08 ENCOUNTER — Telehealth: Payer: Self-pay | Admitting: Physician Assistant

## 2017-01-08 ENCOUNTER — Telehealth: Payer: Self-pay | Admitting: Cardiovascular Disease

## 2017-01-08 ENCOUNTER — Ambulatory Visit (INDEPENDENT_AMBULATORY_CARE_PROVIDER_SITE_OTHER): Payer: Medicare Other | Admitting: Physician Assistant

## 2017-01-08 VITALS — BP 150/82 | HR 50 | Ht 60.0 in | Wt 140.5 lb

## 2017-01-08 DIAGNOSIS — I25119 Atherosclerotic heart disease of native coronary artery with unspecified angina pectoris: Secondary | ICD-10-CM | POA: Diagnosis not present

## 2017-01-08 DIAGNOSIS — E785 Hyperlipidemia, unspecified: Secondary | ICD-10-CM

## 2017-01-08 DIAGNOSIS — I209 Angina pectoris, unspecified: Secondary | ICD-10-CM

## 2017-01-08 DIAGNOSIS — I480 Paroxysmal atrial fibrillation: Secondary | ICD-10-CM | POA: Diagnosis not present

## 2017-01-08 DIAGNOSIS — Z1231 Encounter for screening mammogram for malignant neoplasm of breast: Secondary | ICD-10-CM | POA: Diagnosis not present

## 2017-01-08 DIAGNOSIS — I10 Essential (primary) hypertension: Secondary | ICD-10-CM | POA: Diagnosis not present

## 2017-01-08 DIAGNOSIS — R079 Chest pain, unspecified: Secondary | ICD-10-CM | POA: Diagnosis not present

## 2017-01-08 MED ORDER — ISOSORBIDE MONONITRATE ER 30 MG PO TB24: 30.0000 mg | ORAL_TABLET | Freq: Every day | ORAL | 3 refills | Status: DC

## 2017-01-08 NOTE — Patient Instructions (Addendum)
Medication Instructions:  1. START IMDUR 30 MG DAILY; RX HAS BEEN SENT IN  Labwork: STAT TROPONIN T   Testing/Procedures: 1. Your physician has requested that you have en exercise stress myoview. For further information please visit [email or web address]. Please follow instruction sheet, as given. PER SCOTT WEAVER, PAC THIS NEEDS TO BE DONE IN THE NEXT 1-2 DAYS ASAP; DO NOT HOLD TOPROL OR RYTHMOL     Follow-Up: 2 WEEKS SCOTT WEAVER, PA SAME DAY DR. Acie Fredrickson IS IN THE OFFICE   Any Other Special Instructions Will Be Listed Below (If Applicable).     If you need a refill on your cardiac medications before your next appointment, please call your pharmacy.

## 2017-01-08 NOTE — Telephone Encounter (Signed)
Spoke with patient who states she is concerned about her symptoms which are similar to symptoms experienced in 2014 with previous MI and cardiac stent/PCI. She states she went to the ED on Friday for chest tightness, back pain, elevated BP, and SOB. She was sent home with instructions to increase Toprol for better BP control. She states the SOB continues and she has noticed this for awhile, especially when walking. She denies chest tightness and back pain at present. She denies n/v. States she breaks out into a sweat occasionally but cannot determine the cause and does not necessarily associate it with pain or SOB. I spoke with Richardson Dopp, PA and he is able to see the patient today in the office at 2:15 pm. Patient verbalized understanding and agreement and thanked me for the call.

## 2017-01-08 NOTE — Telephone Encounter (Signed)
Received message from on call PA that Troponin is neg. Unfortunately, no result in chart yet despite stat priority. I called patient on mobile #.  DPR on file - ok to leave message. I left message for patient that Troponin neg and to continue with stress test as planned. Richardson Dopp, PA-C    01/08/2017 8:22 PM

## 2017-01-08 NOTE — Progress Notes (Signed)
Cardiology Office Note:    Date:  01/08/2017   ID:  Shannon Cummings, DOB 25-Dec-1945, MRN [id number]  PCP:  [name], MD  Cardiologist:  Dr. Liam Rogers    Referring MD: [name], MD   Chief Complaint  Patient presents with  . Chest Pain    History of Present Illness:    Shannon Cummings is a 71 y.o. female with a hx of CAD status post prior PCI, persistent atrial fibrillation controlled on Rythmol, HTN, HL, OSA on CPAP. She was admitted in 2014 with non-STEMI in the setting of critical LAD stent in-stent restenosis treated with DES. EF was 45-50 by echocardiogram in 3/14. Last seen by Dr. Acie Fredrickson 1/18.  She was evaluated in the emergency room 01/05/17 for chest pain. Troponin was negative. Chest x-ray was not acute. ECG did not demonstrate acute findings. CT was negative for thoracic or abdominal aortic aneurysm or dissection.  Shannon Cummings returns for further evaluation of her chest discomfort. She tells me that on the date of her visit to the emergency room, she was walking through [employer] and developed interscapular pain and associated chest tightness. She went home but continued to feel worse. She took one nitroglycerin with some relief. Her chest pain continued in the emergency room as well as after discharge for several hours. Since her visit to the emergency room, she has noted dyspnea with mild to moderate activity. She also notes fatigue and decreased exercise tolerance. She denies orthopnea, PND or edema. Her symptoms aren't reminiscent of her myocardial infarction in 2014.  Prior CV studies:   The following studies were reviewed today:  Chest CTA 01/05/17 IMPRESSION: No evidence of thoracic or abdominal aortic aneurysm or dissection. No acute abnormality is noted in the chest, abdomen or pelvis.  LHC 3/14 LAD proximal stent with 99 ISR; D1 40 LCx patent RCA patent EF 45 PCI: 3 x 20 mm Promus Premier DES To the proximal LAD  Echo 3/14 EF 45-50,  anteroseptal and apical hypokinesis  Past Medical History:  Diagnosis Date  . Anginal pain (Shannon Cummings)   . Arthritis   . CAD (coronary artery disease)    a. prior stenting history. b. NSTEMI s/p DES to prox LAD with EF 45% by cath 08/14/12. c. Mild trop elevation shortly after cath ?mild plaque embolization - patent stent on relook.  . Fatty infiltration of liver   . GERD (gastroesophageal reflux disease)   . Hyperlipidemia   . Hypertension   . Myocardial infarction ([place]) 2014  . OSA on CPAP   . Osteoporosis   . PAF (paroxysmal atrial fibrillation) ([place])    On rythmol.  . Pulmonary nodule    a. 84mm subpleural nodular density CT 08/2012, instructed to f/u pulm MD.  . Tubular adenoma of colon 2007    Past Surgical History:  Procedure Laterality Date  . CORONARY ANGIOPLASTY WITH STENT PLACEMENT  08/14/2012   LAD  Dr [name]  . CORONARY STENT PLACEMENT  2000  . LEFT HEART CATHETERIZATION WITH CORONARY ANGIOGRAM N/A 08/14/2012   Procedure: LEFT HEART CATHETERIZATION WITH CORONARY ANGIOGRAM;  Surgeon: [name], MD;  Location: [facility] CATH LAB;  Service: Cardiovascular;  Laterality: N/A;  . LEFT HEART CATHETERIZATION WITH CORONARY ANGIOGRAM N/A 08/19/2012   Procedure: LEFT HEART CATHETERIZATION WITH CORONARY ANGIOGRAM;  Surgeon: [name], MD;  Location: [facility] CATH LAB;  Service: Cardiovascular;  Laterality: N/A;  . ROTATOR CUFF REPAIR    . TONSILLECTOMY    . TOTAL ABDOMINAL HYSTERECTOMY  Current Medications: Current Meds  Medication Sig  . acetaminophen (TYLENOL) 325 MG tablet Take 650 mg by mouth as needed.   . AMBULATORY NON FORMULARY MEDICATION Take 180 mg by mouth daily. Medication Name: bempedoic acid 180 mg vs a placebo.  CLEAR Research Study, drug provided  . apixaban (ELIQUIS) 5 MG TABS tablet Take 1 tablet (5 mg total) by mouth 2 (two) times daily.  Marland Kitchen aspirin EC 81 MG tablet Take 81 mg by mouth at bedtime.  . cholecalciferol (VITAMIN D) 1000 UNITS tablet Take 1,000  Units by mouth daily.  . fluticasone (FLONASE) 50 MCG/ACT nasal spray Place 2 sprays into both nostrils daily.  . metoprolol succinate (TOPROL-XL) 50 MG 24 hr tablet Take 50 mg by mouth 2 (two) times daily. Take with or immediately following a meal.  . nitroGLYCERIN (NITROSTAT) 0.4 MG SL tablet Place 1 tablet (0.4 mg total) under the tongue every 5 (five) minutes as needed for chest pain (up to 3 doses).  . pantoprazole (PROTONIX) 40 MG tablet Take 1 tablet (40 mg total) by mouth daily.  . propafenone (RYTHMOL) 150 MG tablet Take 1 tablet (150 mg total) by mouth 2 (two) times daily.  . temazepam (RESTORIL) 15 MG capsule Take 1 capsule (15 mg total) by mouth at bedtime as needed for sleep.     Allergies:   No known allergies; Other; Statins; and Zetia [ezetimibe]   Social History   Social History  . Marital status: Married    Spouse name: N/A  . Number of children: 2  . Years of education: N/A   Occupational History  . Retired [employer]     works part time in [employer]   Social History Main Topics  . Smoking status: Never Smoker  . Smokeless tobacco: Never Used  . Alcohol use Yes     Comment: 2 drinks daily   . Drug use: No  . Sexual activity: Not Currently   Other Topics Concern  . None   Social History Narrative   Daily caffeine      Family Hx: The patient's family history includes Alzheimer's disease in her mother; Breast cancer in her maternal aunt; Heart disease in her father. There is no history of Colon cancer.  ROS:   Please see the history of present illness.    Review of Systems  Cardiovascular: Positive for chest pain and dyspnea on exertion.  Musculoskeletal: Positive for back pain.  Neurological: Positive for dizziness.   All other systems reviewed and are negative.   EKGs/Labs/Other Test Reviewed:    EKG:  EKG is   ordered today.  The ekg ordered today demonstrates sinus brady, HR 50, normal axis, QTc 408 ms  Recent Labs: 07/10/2016: ALT 28;  TSH 2.70 01/05/2017: BUN 11; Creatinine, Ser 0.91; Hemoglobin 14.9; Platelets 203; Potassium 4.1; Sodium 136   Recent Lipid Panel Lab Results  Component Value Date/Time   CHOL 256 (H) 06/23/2015 11:53 AM   TRIG 215 (H) 06/23/2015 11:53 AM   HDL 44 (L) 06/23/2015 11:53 AM   CHOLHDL 5.8 (H) 06/23/2015 11:53 AM   LDLCALC 169 (H) 06/23/2015 11:53 AM   LDLDIRECT 174.0 12/31/2012 07:54 AM    Physical Exam:    VS:  BP (!) 150/82   Pulse (!) 50   Ht 5' (1.524 m)   Wt 140 lb 8 oz (63.7 kg)   SpO2 98%   BMI 27.44 kg/m     Wt Readings from Last 3 Encounters:  01/08/17 140  lb 8 oz (63.7 kg)  01/05/17 138 lb (62.6 kg)  09/12/16 141 lb 6 oz (64.1 kg)     Physical Exam  Constitutional: She is oriented to person, place, and time. She appears well-developed and well-nourished. No distress.  HENT:  Head: Normocephalic and atraumatic.  Eyes: No scleral icterus.  Neck: No JVD present.  Cardiovascular: Normal rate, regular rhythm and normal heart sounds.   No murmur heard. Pulmonary/Chest: She has no wheezes. She has no rales.  Abdominal: There is no tenderness.  Musculoskeletal: She exhibits no edema.  Neurological: She is alert and oriented to person, place, and time.  Skin: Skin is warm and dry.  Psychiatric: She has a normal mood and affect.    ASSESSMENT:    1. Chest pain, unspecified type   2. Coronary artery disease involving native coronary artery of native heart with angina pectoris (HCC)   3. Paroxysmal A-fib ([place])   4. Essential hypertension   5. Hyperlipidemia, unspecified hyperlipidemia type    PLAN:    In order of problems listed above:  1. Chest pain, unspecified type -  She presents with chest symptoms reminiscent of her previous angina. She had several hours of chest pain with negative troponins in the emergency room. Of note, her symptoms started about 1-2 hours prior to presenting to the emergency room. She's had uncontrolled blood pressure since her chest  symptoms started. She's had no further chest pain since going to the ED, but she has been quite fatigued and has noted dyspnea with exertion. Her ECG is unchanged. Given her prior history of similar symptoms, I have recommended proceeding with cardiac catheterization. However, she is quite hesitant to go this route. She would prefer to start with stress testing. After a long discussion, we agreed to proceed with stress testing and close follow-up.  -  Obtain stat Troponin today - proceed to ED if elevated  -  Arrange ETT-Myoview  -  Start Isosorbide 30 mg QD  -  Close follow up with Dr. Liam Rogers or me in 2 weeks.  2. Coronary artery disease involving native coronary artery of native heart with angina pectoris ([place]) -  As noted, she has symptoms that remind her of her prior angina.  Her current symptoms are not as severe as in 2014.  In any event, she would like to avoid a Cardiac Catheterization.  Therefore, I will proceed with a Troponin T today, arrange a Nuclear stress test and start nitrates with Isosorbide 30 mg QD.  Continue ASA.  She is intol of statins, ezetimibe.   3. Paroxysmal A-fib (HCC) -  Maintaining NSR.  Continue Apixaban, Rythmol.  Recent BMET, CBC done in ED ok.    4. Essential hypertension -  BP elevated. Add Isosorbide as noted.  Continue Metoprolol.   5. Hyperlipidemia, unspecified hyperlipidemia type  She is intol to statins, ezetimibe.     Dispo:  Return in about 2 weeks (around 01/22/2017) for Close Follow Up, w/ Dr. Acie Fredrickson, or Richardson Dopp, PA-C.   Medication Adjustments/Labs and Tests Ordered: Current medicines are reviewed at length with the patient today.  Concerns regarding medicines are outlined above.  Tests Ordered: Orders Placed This Encounter  Procedures  . Troponin T  . Myocardial Perfusion Imaging  . EKG 12-Lead   Medication Changes: Meds ordered this encounter  Medications  . isosorbide mononitrate (IMDUR) 30 MG 24 hr tablet    Sig: Take 1  tablet (30 mg total) by mouth daily.  Dispense:  90 tablet    Refill:  3    Signed, Richardson Dopp, PA-C  01/08/2017 4:49 PM    [facility] [place], [place], [place]  [id number] Phone: [phone]; Fax: [phone]

## 2017-01-08 NOTE — Telephone Encounter (Signed)
Pt calling c/o SOB, Sharp pain in back, high BP and dizziness over weekend, went to ER, today still having SOB, can't hear any, dizziness and high BP-pt has no readings to give -pt had similar symptoms in the past and tired to get an appt and couldn't get in for a month, ended up having a heart attack, Nahser told her if symptoms happen again and can't get in ask to speak to Sharyn Lull, he is not in the office until 8-14-pls advise  [phone]

## 2017-01-09 ENCOUNTER — Telehealth: Payer: Self-pay

## 2017-01-09 ENCOUNTER — Emergency Department (HOSPITAL_COMMUNITY): Payer: Medicare Other

## 2017-01-09 ENCOUNTER — Telehealth: Payer: Self-pay | Admitting: Cardiovascular Disease

## 2017-01-09 ENCOUNTER — Telehealth: Payer: Self-pay | Admitting: *Deleted

## 2017-01-09 ENCOUNTER — Emergency Department (HOSPITAL_COMMUNITY)
Admission: EM | Admit: 2017-01-09 | Discharge: 2017-01-10 | Disposition: A | Discharge status: Home / Self Care | Payer: Medicare Other | Attending: Emergency Medicine | Admitting: Emergency Medicine

## 2017-01-09 ENCOUNTER — Encounter (HOSPITAL_COMMUNITY): Payer: Self-pay | Admitting: Emergency Medicine

## 2017-01-09 ENCOUNTER — Telehealth (HOSPITAL_COMMUNITY): Payer: Self-pay | Admitting: *Deleted

## 2017-01-09 DIAGNOSIS — R0602 Shortness of breath: Secondary | ICD-10-CM | POA: Insufficient documentation

## 2017-01-09 DIAGNOSIS — R079 Chest pain, unspecified: Secondary | ICD-10-CM | POA: Diagnosis not present

## 2017-01-09 DIAGNOSIS — Z5321 Procedure and treatment not carried out due to patient leaving prior to being seen by health care provider: Secondary | ICD-10-CM | POA: Insufficient documentation

## 2017-01-09 LAB — BASIC METABOLIC PANEL
ANION GAP: 10 (ref 5–15)
BUN: 16 mg/dL (ref 6–20)
CO2: 25 mmol/L (ref 22–32)
Calcium: 9.3 mg/dL (ref 8.9–10.3)
Chloride: 103 mmol/L (ref 101–111)
Creatinine, Ser: 1.11 mg/dL — ABNORMAL HIGH (ref 0.44–1.00)
GFR, EST AFRICAN AMERICAN: 57 mL/min — AB (ref >60)
GFR, EST NON AFRICAN AMERICAN: 49 mL/min — AB (ref >60)
Glucose, Bld: 112 mg/dL — ABNORMAL HIGH (ref 65–99)
POTASSIUM: 3.8 mmol/L (ref 3.5–5.1)
SODIUM: 138 mmol/L (ref 135–145)

## 2017-01-09 LAB — CBC
HEMATOCRIT: 42.8 % (ref 36.0–46.0)
HEMOGLOBIN: 14.7 g/dL (ref 12.0–15.0)
MCH: 31.1 pg (ref 26.0–34.0)
MCHC: 34.3 g/dL (ref 30.0–36.0)
MCV: 90.5 fL (ref 78.0–100.0)
PLATELETS: 196 10*3/uL (ref 150–400)
RBC: 4.73 MIL/uL (ref 3.87–5.11)
RDW: 13.4 % (ref 11.5–15.5)
WBC: 9 10*3/uL (ref 4.0–10.5)

## 2017-01-09 LAB — TROPONIN I: Troponin I: <0.03 ng/mL (ref <0.03)

## 2017-01-09 LAB — TROPONIN T

## 2017-01-09 NOTE — Telephone Encounter (Signed)
Spoke with patient regarding ER follow up. Patient declines appt at this time, has followed up with cardiology. Scheduled for stress test tomorrow (01/09/2017). Advised to call for f/u when needed.

## 2017-01-09 NOTE — ED Triage Notes (Signed)
Pt c/o chest tightness and shortness of breath.  Was seen here 4 days ago for same, followed up with cardiologist yesterday.  Pt st's she is scheduled to have stress test tomorrow.

## 2017-01-09 NOTE — Telephone Encounter (Signed)
Patient unable to keep appt, will need to reschedule AWV and CPE

## 2017-01-09 NOTE — Telephone Encounter (Signed)
Pt has been notified of negative Troponin level. Pt states she feels a little weak, denies chest pain. Said she did not take her 2nd dose of Metoprolol last night due to she states checked BP and reading was 120/74 so she was afraid it would drop her BP too low. Pt states though her BP was high this morning. I did explain to the pt that the Metoprolol she is on is extended release which would had worked overnight to help keep her BP in a normal range. I advised if she feels like that again she could try taking a 1/2 tablet of the Metoprolol, though she also should call the office as well for further advice. Pt is scheduled for her Myoview tomorrow 8/8. Pt is agreeable to plan of care.

## 2017-01-09 NOTE — Telephone Encounter (Signed)
Patient calling and states that she is feeling weak and SOB and doesn't feel like she can walk on the treadmill for her stress test tomorrow. Patient states that she has had a headache since starting the imdur. Instructed the patient to try taking tylenol for her HA. Discussed with Richardson Dopp, PA and he recommended that the patient keep appointment tomorrow and change it to a Lexiscan. Patient agrees to keep appointment for tomorrow morning and states that she will go to the ER if she starts to feel worse.

## 2017-01-09 NOTE — Telephone Encounter (Signed)
-----   Message from Liliane Shi, [place] sent at 01/09/2017  8:58 AM EDT ----- Please call the patient The Troponin is negative.  Continue with current treatment plan. Richardson Dopp, PA-C   01/09/2017 8:57 AM

## 2017-01-09 NOTE — Telephone Encounter (Signed)
Patient given detailed instructions per Myocardial Perfusion Study Information Sheet for the test on 01/10/17 at 7:30. Patient notified to arrive 15 minutes early and that it is imperative to arrive on time for appointment to keep from having the test rescheduled.  If you need to cancel or reschedule your appointment, please call the office within 24 hours of your appointment. . Patient verbalized understanding.Shannon Cummings

## 2017-01-09 NOTE — Telephone Encounter (Signed)
New message    Pt is calling asking for a call back. Pt states she feels really bad right now. She said she can't do her test tomorrow because she's weak.   Pt c/o Shortness Of Breath: STAT if SOB developed within the last 24 hours or pt is noticeably SOB on the phone  1. Are you currently SOB (can you hear that pt is SOB on the phone)? Yes  2. How long have you been experiencing SOB? All day-in ER last Friday.   3. Are you SOB when sitting or when up moving around? both  4. Are you currently experiencing any other symptoms? headache

## 2017-01-09 NOTE — Telephone Encounter (Signed)
LM requesting call back regarding upcoming appointments. Requesting patient to have AWV on 01/17/2017 @ 8 (instead of 10) for CPE appt with PCP @ 9.

## 2017-01-09 NOTE — Telephone Encounter (Signed)
Pt has been notified of negative Troponin results by phone with verbal understanding.

## 2017-01-10 ENCOUNTER — Encounter: Payer: Self-pay | Admitting: Physician Assistant

## 2017-01-10 ENCOUNTER — Ambulatory Visit (HOSPITAL_BASED_OUTPATIENT_CLINIC_OR_DEPARTMENT_OTHER): Payer: Medicare Other

## 2017-01-10 DIAGNOSIS — I10 Essential (primary) hypertension: Secondary | ICD-10-CM | POA: Diagnosis not present

## 2017-01-10 DIAGNOSIS — I25119 Atherosclerotic heart disease of native coronary artery with unspecified angina pectoris: Secondary | ICD-10-CM | POA: Diagnosis not present

## 2017-01-10 DIAGNOSIS — I480 Paroxysmal atrial fibrillation: Secondary | ICD-10-CM | POA: Diagnosis not present

## 2017-01-10 DIAGNOSIS — R079 Chest pain, unspecified: Secondary | ICD-10-CM

## 2017-01-10 LAB — MYOCARDIAL PERFUSION IMAGING
CHL CUP NUCLEAR SDS: 4
CHL CUP NUCLEAR SRS: 9
CHL CUP RESTING HR STRESS: 52 {beats}/min
LHR: 0.29
LV dias vol: 79 mL (ref 46–106)
LV sys vol: 32 mL
NUC STRESS TID: 1.04
Peak HR: 71 {beats}/min
SSS: 13

## 2017-01-10 MED ORDER — TECHNETIUM TC 99M TETROFOSMIN IV KIT
9.7000 | PACK | Freq: Once | INTRAVENOUS | Status: AC | PRN
  Administered 2017-01-10: 9.7 via INTRAVENOUS
  Filled 2017-01-10: qty 10

## 2017-01-10 MED ORDER — REGADENOSON 0.4 MG/5ML IV SOLN
0.4000 mg | Freq: Once | INTRAVENOUS | Status: AC
  Administered 2017-01-10: 0.4 mg via INTRAVENOUS

## 2017-01-10 MED ORDER — TECHNETIUM TC 99M TETROFOSMIN IV KIT
30.8000 | PACK | Freq: Once | INTRAVENOUS | Status: AC | PRN
  Administered 2017-01-10: 30.8 via INTRAVENOUS
  Filled 2017-01-10: qty 31

## 2017-01-10 NOTE — ED Notes (Signed)
Patient was called to reassess vital signs and did not answer.

## 2017-01-10 NOTE — ED Notes (Signed)
Pt was called for vitals re-check, no answer.

## 2017-01-17 ENCOUNTER — Ambulatory Visit: Payer: Medicare Other | Admitting: Family Medicine

## 2017-01-18 ENCOUNTER — Telehealth: Payer: Self-pay | Admitting: Cardiovascular Disease

## 2017-01-18 NOTE — Telephone Encounter (Signed)
Patient is having a hair transplant and provider is wanting to hold eliquis for 72 hours. Please send response to fax # [phone].

## 2017-01-18 NOTE — Telephone Encounter (Signed)
° °  [facility] Pre-operative Risk Assessment    Request for surgical clearance:  1. What type of surgery is being performed?   2. When is this surgery scheduled? 01-29-17  3. Are there any medications that need to be held prior to surgery and how long?Hold  Eliquis for 72 hours  4. Name of physician performing surgery? Dr. Shelda Pal  5. What is your office phone and fax number? office # [phone] Fax # [phone]  Shannon Cummings 01/18/2017, 1:42 PM  _________________________________________________________________   (provider comments below)

## 2017-01-18 NOTE — Telephone Encounter (Signed)
Patient with diagnosis of Afib on Eliquis for anticoagulation.    Procedure: hair transplat Date of procedure: 01/29/17  CHADS2 score of 1 (CHF, HTN, AGE, DM2, stroke/tia x 2);  CHADS2-VASc score of  4 (CHF, HTN, AGE, DM2, stroke/tia x 2, CAD, AGE, female)  CrCl 51mL/min  Per office protocol, patient can hold Eliquis for 2 days prior to procedure.    Faxed back to number provided via Epic

## 2017-01-25 ENCOUNTER — Ambulatory Visit (INDEPENDENT_AMBULATORY_CARE_PROVIDER_SITE_OTHER): Payer: Medicare Other | Admitting: Cardiovascular Disease

## 2017-01-25 ENCOUNTER — Ambulatory Visit (INDEPENDENT_AMBULATORY_CARE_PROVIDER_SITE_OTHER): Payer: Medicare Other | Admitting: Orthopedic Surgery

## 2017-01-25 ENCOUNTER — Encounter (INDEPENDENT_AMBULATORY_CARE_PROVIDER_SITE_OTHER): Payer: Self-pay | Admitting: Orthopedic Surgery

## 2017-01-25 ENCOUNTER — Encounter: Payer: Self-pay | Admitting: Cardiovascular Disease

## 2017-01-25 VITALS — BP 140/90 | HR 53 | Ht 60.0 in | Wt 141.8 lb

## 2017-01-25 DIAGNOSIS — I480 Paroxysmal atrial fibrillation: Secondary | ICD-10-CM

## 2017-01-25 DIAGNOSIS — M25512 Pain in left shoulder: Secondary | ICD-10-CM

## 2017-01-25 DIAGNOSIS — I209 Angina pectoris, unspecified: Secondary | ICD-10-CM

## 2017-01-25 DIAGNOSIS — I25119 Atherosclerotic heart disease of native coronary artery with unspecified angina pectoris: Secondary | ICD-10-CM

## 2017-01-25 NOTE — Progress Notes (Signed)
Cardiology Office Note   Date:  01/25/2017   ID:  Shannon Cummings, Shannon Cummings Oct 02, 1945, MRN [id number]  PCP:  [name], MD  Cardiologist:   [name], MD   Chief Complaint  Patient presents with  . Follow-up    CAD   1. Coronary artery disease-status post stenting in the past. She status post stenting of her proximal LAD 08/14/2012 2. Intermittent atrial fibrillation 3.  hyperlipidemia-she has been generally intolerant to most statins  History of Present Illness:  Pt is doing well. No cardiac complaints.   She complains of some tingling and numbness associated with the Crestor. She has discontinued the Crestor because of that reason. She has had reactions to most statins that we have tried.  October 08-2011  She has done well. She has not had any angina or eposides of atrial fib. She has retired and is traveling quite a bit. Her husband has also retired. She has not had any problems.  October 01, 2012:  She is having some bruising ( due to [place]). She is enjoying the cardiac rehab. She has noticed some low BP readings at the end of cardiac rehab. She does not feel bad. These low readings are typically asymptomatic.   No angina. No arrhythmias.   August 20, 2013:  Shannon Cummings is doing ok. She was admitted to the hospital with some CP recently. Rule out for MI.  She has been doing [place]. Has gained a bit of weight. She is not able to exercise because Of left ankle problems.    August 19, 2014:   Shannon Cummings is a 71 y.o. female who presents for follow up of her atrial fib. Has gained some weight.  She is in the study for lipid management Has had leg cramps since last fall. Off and on  Has had more paroxysmal atrial fib.  Lasts about a minute. Seem to be occuring more frequently.   Jan. 18, 2017:  Shannon Cummings presents for follow up of her CAD and atrial fib.  Doing well.   Has visited [place] recently .   No CP or dyspnea.   Tolerates the  Rythmol.  Talked about her daughter who lives in [place].  Is not having any significant atrial fib issues.   Has been on lipid lowering study drug at [facility]  ( injectable cholesterol medication )   Jan.   25, 2018:  Doing well.   Feeling well Has had a stomach bug last week and now has a URI  - not the flu.   Has maintained NSR .  Is having hair loss.  Her research shows that amlodipine and atenolol can cause this   Aug. 23, 2018:  Shannon Cummings is seen today for eval of some elevated BP Has been having pain in her back, tightness in her chest  Went to ER.  Has had elevated BP for a week,   Went back to ER  Doubled her metoprolol XL .  Saw Richardson Dopp on Aug. 6. 2018 Was given Imdur - developed a headache that lasted 3 days  Lexiscan myoview was low risk.   Her back pain / chest tightness has resolved.     Past Medical History:  Diagnosis Date  . Anginal pain ([place])   . Arthritis   . CAD (coronary artery disease)    a. prior stenting history. b. NSTEMI s/p DES to prox LAD with EF 45% by cath 08/14/12. c. Mild trop elevation shortly after cath ?mild plaque embolization - patent  stent on relook. // d. Myoview 8/18: EF 60, normal perfusion; Low Risk  . Fatty infiltration of liver   . GERD (gastroesophageal reflux disease)   . Hyperlipidemia   . Hypertension   . Myocardial infarction ([place]) 2014  . OSA on CPAP   . Osteoporosis   . PAF (paroxysmal atrial fibrillation) ([place])    On rythmol.  . Pulmonary nodule    a. 70mm subpleural nodular density CT 08/2012, instructed to f/u pulm MD.  . Tubular adenoma of colon 2007    Past Surgical History:  Procedure Laterality Date  . CORONARY ANGIOPLASTY WITH STENT PLACEMENT  08/14/2012   LAD  Dr [name]  . CORONARY STENT PLACEMENT  2000  . LEFT HEART CATHETERIZATION WITH CORONARY ANGIOGRAM N/A 08/14/2012   Procedure: LEFT HEART CATHETERIZATION WITH CORONARY ANGIOGRAM;  Surgeon: [name], MD;  Location: [facility] CATH LAB;   Service: Cardiovascular;  Laterality: N/A;  . LEFT HEART CATHETERIZATION WITH CORONARY ANGIOGRAM N/A 08/19/2012   Procedure: LEFT HEART CATHETERIZATION WITH CORONARY ANGIOGRAM;  Surgeon: [name], MD;  Location: [facility] CATH LAB;  Service: Cardiovascular;  Laterality: N/A;  . ROTATOR CUFF REPAIR    . TONSILLECTOMY    . TOTAL ABDOMINAL HYSTERECTOMY       Current Outpatient Prescriptions  Medication Sig Dispense Refill  . acetaminophen (TYLENOL) 325 MG tablet Take 650 mg by mouth daily as needed (pain).     . AMBULATORY NON FORMULARY MEDICATION Take 180 mg by mouth daily. Medication Name: bempedoic acid 180 mg vs a placebo.  CLEAR Research Study, drug provided    . apixaban (ELIQUIS) 5 MG TABS tablet Take 1 tablet (5 mg total) by mouth 2 (two) times daily. 180 tablet 3  . aspirin EC 81 MG tablet Take 81 mg by mouth at bedtime.    . cholecalciferol (VITAMIN D) 1000 UNITS tablet Take 1,000 Units by mouth daily.    . fluticasone (FLONASE) 50 MCG/ACT nasal spray Place 2 sprays into both nostrils daily. 16 g 6  . isosorbide mononitrate (IMDUR) 30 MG 24 hr tablet Take 1 tablet (30 mg total) by mouth daily. 90 tablet 3  . metoprolol succinate (TOPROL-XL) 50 MG 24 hr tablet Take 50 mg by mouth 2 (two) times daily. Take with or immediately following a meal.    . nitroGLYCERIN (NITROSTAT) 0.4 MG SL tablet Place 1 tablet (0.4 mg total) under the tongue every 5 (five) minutes as needed for chest pain (up to 3 doses). 25 tablet 6  . pantoprazole (PROTONIX) 40 MG tablet Take 1 tablet (40 mg total) by mouth daily. 30 tablet 3  . propafenone (RYTHMOL) 150 MG tablet Take 1 tablet (150 mg total) by mouth 2 (two) times daily. 180 tablet 3  . temazepam (RESTORIL) 15 MG capsule Take 1 capsule (15 mg total) by mouth at bedtime as needed for sleep. 30 capsule 5   No current facility-administered medications for this visit.     Allergies:   No known allergies; Other; Statins; and Zetia [ezetimibe]    Social  History:  The patient  reports that she has never smoked. She has never used smokeless tobacco. She reports that she drinks alcohol. She reports that she does not use drugs.   Family History:  The patient's family history includes Alzheimer's disease in her mother; Breast cancer in her maternal aunt; Heart disease in her father.    ROS:  Please see the history of present illness.    Review of Systems: Constitutional:  denies  fever, chills, diaphoresis, appetite change and fatigue.  HEENT: denies photophobia, eye pain, redness, hearing loss, ear pain, congestion, sore throat, rhinorrhea, sneezing, neck pain, neck stiffness and tinnitus.  Respiratory: denies SOB, DOE, cough, chest tightness, and wheezing.  Cardiovascular: denies chest pain, palpitations and leg swelling.  Gastrointestinal: denies nausea, vomiting, abdominal pain, diarrhea, constipation, blood in stool.  Genitourinary: denies dysuria, urgency, frequency, hematuria, flank pain and difficulty urinating.  Musculoskeletal: denies  myalgias, back pain, joint swelling, arthralgias and gait problem.   Skin: denies pallor, rash and wound.  Neurological: denies dizziness, seizures, syncope, weakness, light-headedness, numbness and headaches.   Hematological: denies adenopathy, easy bruising, personal or family bleeding history.  Psychiatric/ Behavioral: denies suicidal ideation, mood changes, confusion, nervousness, sleep disturbance and agitation.       All other systems are reviewed and negative.    PHYSICAL EXAM: VS:  BP 140/90   Pulse (!) 53   Ht 5' (1.524 m)   Wt 141 lb 12.8 oz (64.3 kg)   BMI 27.69 kg/m  , BMI Body mass index is 27.69 kg/m. GEN: Well nourished, well developed, in no acute distress  HEENT: normal  Neck: no JVD, carotid bruits, or masses Cardiac: RR; no murmurs, rubs, or gallops,no edema  Respiratory:  clear to auscultation bilaterally, normal work of breathing GI: soft, nontender, nondistended, +  BS MS: no deformity or atrophy  Skin: warm and dry, no rash Neuro:  Strength and sensation are intact Psych: normal   EKG:  EKG is ordered today. The ekg ordered today demonstrates sinus brady at 52.  Otherwise normal .    Recent Labs: 07/10/2016: ALT 28; TSH 2.70 01/09/2017: BUN 16; Creatinine, Ser 1.11; Hemoglobin 14.7; Platelets 196; Potassium 3.8; Sodium 138    Lipid Panel    Component Value Date/Time   CHOL 256 (H) 06/23/2015 1153   TRIG 215 (H) 06/23/2015 1153   HDL 44 (L) 06/23/2015 1153   CHOLHDL 5.8 (H) 06/23/2015 1153   VLDL 43 (H) 06/23/2015 1153   LDLCALC 169 (H) 06/23/2015 1153   LDLDIRECT 174.0 12/31/2012 0754      Wt Readings from Last 3 Encounters:  01/25/17 141 lb 12.8 oz (64.3 kg)  01/09/17 139 lb (63 kg)  01/08/17 140 lb 8 oz (63.7 kg)      Other studies Reviewed: Additional studies/ records that were reviewed today include: . Review of the above records demonstrates:    ASSESSMENT AND PLAN:  1. Coronary artery disease-status post stenting in the past. She status post stenting of her proximal LAD 08/14/2012.  Her chest and back pain has improved. troponins were negative  myoview was negative.  2. Intermittent atrial fibrillation -  We'll continue the current dose of Rythmol. She's been on Rythmol for years and has done well. This been no evidence of proarrhythmia-despite her having significant coronary artery disease.  Continue Eliquis  5 mg twice a day  continue metoprolol XL   3.  hyperlipidemia-she has been generally intolerant to most statins. She would like to get involved with another [place] lipid study .  4. Essential HTN:   BP is better. Some BP readings are normal at home.  DC imdur - ( she is not taking it anyway )  Continue current meds  I'll see her in 6 months for follow-up visit.   Current medicines are reviewed at length with the patient today.  The patient does not have concerns regarding medicines.  The following  changes have been made:  no change  Labs/ tests ordered today include:   No orders of the defined types were placed in this encounter.    Disposition:   FU with me in  6 months.    Signed, [name], MD  01/25/2017 7:48 AM    [address], [place], [place]  [address] Phone: [phone]; Fax: [phone]

## 2017-01-25 NOTE — Patient Instructions (Signed)
Medication Instructions:  STOP Imdur   Labwork: None Ordered   Testing/Procedures: None Ordered   Follow-Up: Your physician recommends that you schedule a follow-up appointment in: 3 months with Dr. Acie Fredrickson   If you need a refill on your cardiac medications before your next appointment, please call your pharmacy.   Thank you for choosing [facility]! Christen Bame, RN [phone]

## 2017-01-25 NOTE — Progress Notes (Signed)
Office Visit Note   Patient: Shannon Cummings           Date of Birth: Feb 21, 1946           MRN: [id number] Visit Date: 01/25/2017 Requested by: [name], MD [address] [place], [place] [address] PCP: [name], MD  Subjective: Chief Complaint  Patient presents with  . Left Shoulder - Follow-up    HPI: Shannon Cummings is a 71 year old patient with left shoulder pain.  Been going on for several months.  She's been taken some time off with that shoulder but was very active with her grandchild yesterday in the pull in her shoulder pain recurred.  She hasn't had right shoulder rotator cuff surgery and did well with that.  She can sleep on that side.  She's had injections in the past which did not help the right shoulder.  It is hard for her to swim.  Localizes the pain anteriorly in that left shoulder.  We look at the shoulder with ultrasound last clinic visit and solid look to be potential partial-thickness rotator cuff tearing              ROS: All systems reviewed are negative as they relate to the chief complaint within the history of present illness.  Patient denies  fevers or chills.   Assessment & Plan: Visit Diagnoses:  1. Left shoulder pain, unspecified chronicity     Plan: Impression is left shoulder pain possible bursitis or partial-thickness rotator cuff tearing or potentially labral pathology.  Been going on for a long time.  She's had similar problems on the right shoulder which did culminate in surgical intervention.  Plan MRI arthrogram left shoulder to evaluate the labrum and possible small rotator cuff tear.  We'll see her back after that study  Follow-Up Instructions: Return for after MRI.   Orders:  Orders Placed This Encounter  Procedures  . MR Shoulder Left w/ contrast  . Arthrogram   No orders of the defined types were placed in this encounter.     Procedures: No procedures performed   Clinical Data: No additional  findings.  Objective: Vital Signs: There were no vitals taken for this visit.  Physical Exam:   Constitutional: Patient appears well-developed HEENT:  Head: Normocephalic Eyes:EOM are normal Neck: Normal range of motion Cardiovascular: Normal rate Pulmonary/chest: Effort normal Neurologic: Patient is alert Skin: Skin is warm Psychiatric: Patient has normal mood and affect    Ortho Exam: Orthopedic exam demonstrates good active and passive range of motion of the left shoulder with positive impingement signs no course grinding or crepitus with internal/external rotation of that left shoulder negative apprehension relocation testing no discrete acromioclavicular joint tenderness no other masses lymph adenopathy or skin changes noted in the left shoulder region.  O'Brien's testing equivocal on the left-hand side.  Specialty Comments:  No specialty comments available.  Imaging: No results found.   PMFS History: Patient Active Problem List   Diagnosis Date Noted  . GERD (gastroesophageal reflux disease) 07/10/2016  . History of colonic polyps 02/25/2016  . Antiplatelet or antithrombotic long-term use 02/25/2016  . Nodule of right lung 10/13/2012  . History of non-ST elevation myocardial infarction (NSTEMI) 08/14/2012  . Hepatic steatosis 12/03/2011  . Paroxysmal a-fib ([place]) 12/22/2010  . CAD (coronary artery disease) 12/22/2010  . INSOMNIA 04/13/2010  . Hyperlipidemia 03/19/2010  . Essential hypertension 03/19/2010  . Obstructive sleep apnea 03/17/2010   Past Medical History:  Diagnosis Date  .  Anginal pain ([place])   . Arthritis   . CAD (coronary artery disease)    a. prior stenting history. b. NSTEMI s/p DES to prox LAD with EF 45% by cath 08/14/12. c. Mild trop elevation shortly after cath ?mild plaque embolization - patent stent on relook. // d. Myoview 8/18: EF 60, normal perfusion; Low Risk  . Fatty infiltration of liver   . GERD (gastroesophageal reflux disease)   .  Hyperlipidemia   . Hypertension   . Myocardial infarction ([place]) 2014  . OSA on CPAP   . Osteoporosis   . PAF (paroxysmal atrial fibrillation) ([place])    On rythmol.  . Pulmonary nodule    a. 49mm subpleural nodular density CT 08/2012, instructed to f/u pulm MD.  . Tubular adenoma of colon 2007    Family History  Problem Relation Age of Onset  . Heart disease Father   . Alzheimer's disease Mother   . Breast cancer Maternal Aunt   . Colon cancer Neg Hx     Past Surgical History:  Procedure Laterality Date  . CORONARY ANGIOPLASTY WITH STENT PLACEMENT  08/14/2012   LAD  Dr [name]  . CORONARY STENT PLACEMENT  2000  . LEFT HEART CATHETERIZATION WITH CORONARY ANGIOGRAM N/A 08/14/2012   Procedure: LEFT HEART CATHETERIZATION WITH CORONARY ANGIOGRAM;  Surgeon: [name], MD;  Location: [facility] CATH LAB;  Service: Cardiovascular;  Laterality: N/A;  . LEFT HEART CATHETERIZATION WITH CORONARY ANGIOGRAM N/A 08/19/2012   Procedure: LEFT HEART CATHETERIZATION WITH CORONARY ANGIOGRAM;  Surgeon: [name], MD;  Location: [facility] C[facility] LAB;  Service: Cardiovascular;  Laterality: N/A;  . ROTATOR CUFF REPAIR    . TONSILLECTOMY    . TOTAL ABDOMINAL HYSTERECTOMY     Social History   Occupational History  . Retired [employer]     works part time in [employer]   Social History Main Topics  . Smoking status: Never Smoker  . Smokeless tobacco: Never Used  . Alcohol use Yes     Comment: 2 drinks daily   . Drug use: No  . Sexual activity: Not Currently

## 2017-02-03 HISTORY — PX: OTHER SURGICAL HISTORY: SHX169

## 2017-02-07 ENCOUNTER — Encounter: Payer: Self-pay | Admitting: *Deleted

## 2017-02-07 DIAGNOSIS — Z006 Encounter for examination for normal comparison and control in clinical research program: Secondary | ICD-10-CM

## 2017-02-07 NOTE — Progress Notes (Signed)
Subject to research clinic for visit T6-M12 in the Clear Research study.  No c/o or saes to report. Aes recorded. Subject >80% compliant with study medications.  New meds administered and f/u TC and next appointment scheduled.  Subject re-consented to [place] version 4.1 [id number] Local version [phone]

## 2017-02-08 ENCOUNTER — Other Ambulatory Visit: Payer: Medicare Other

## 2017-02-19 ENCOUNTER — Ambulatory Visit (INDEPENDENT_AMBULATORY_CARE_PROVIDER_SITE_OTHER): Payer: Medicare Other | Admitting: Orthopedic Surgery

## 2017-03-26 ENCOUNTER — Ambulatory Visit (INDEPENDENT_AMBULATORY_CARE_PROVIDER_SITE_OTHER): Payer: Medicare Other

## 2017-03-26 DIAGNOSIS — Z23 Encounter for immunization: Secondary | ICD-10-CM

## 2017-04-17 ENCOUNTER — Telehealth: Payer: Self-pay | Admitting: Cardiovascular Disease

## 2017-04-17 MED ORDER — METOPROLOL SUCCINATE ER 50 MG PO TB24
50.0000 mg | ORAL_TABLET | Freq: Two times a day (BID) | ORAL | 9 refills | Status: DC
Start: 2017-04-17 — End: 2017-05-10

## 2017-04-17 NOTE — Telephone Encounter (Signed)
Pt's medication was sent to pt's pharmacy as requested. Confirmation received.  °

## 2017-04-17 NOTE — Telephone Encounter (Signed)
New message   Patient calling, states she is out of blood pressure medication.    *STAT* If patient is at the pharmacy, call can be transferred to refill team.   1. Which medications need to be refilled? (please list name of each medication and dose if known) metoprolol succinate (TOPROL-XL) 50 MG 24 hr tablet  2. Which pharmacy/location (including street and city if local pharmacy) is medication to be sent to? [facility] [place]  3. Do they need a 30 day or 90 day supply? [place]

## 2017-05-01 NOTE — Progress Notes (Addendum)
Subjective:   Shannon Cummings is a 71 y.o. female who presents for an Initial Medicare Annual Wellness Visit.  Review of Systems    No ROS.  Medicare Wellness Visit. Additional risk factors are reflected in the social history.   Cardiac Risk Factors include: advanced age (>68men, >46 women);dyslipidemia;hypertension;family history of premature cardiovascular disease   Sleep patterns: Sleeps 6 hours, interrupted sleep nightly. OSA, does not use CPAP. Uses mouth appliance for snoring.  Home Safety/Smoke Alarms: Feels safe in home. Smoke alarms in place.  Living environment; residence and Firearm Safety: Lives with husband in 2 story home.  Seat Belt Safety/Bike Helmet: Wears seat belt.   Female:   Pap-N/A       Mammo-01/08/2017, benign. [facility] [place].   Dexa scan-Declines testing.         [facility]-Colonoscopy 03/07/2016, polyp. Recall 3 years.       Objective:    Today's Vitals   05/02/17 1335  BP: 112/70  Pulse: (!) 54  Resp: 18  Temp: 97.7 F (36.5 C)  TempSrc: Oral  SpO2: 96%  Weight: 141 lb (64 kg)  Height: 5' (1.524 m)   Body mass index is 27.54 kg/m.   Current Medications (verified) Outpatient Encounter Medications as of 05/02/2017  Medication Sig  . acetaminophen (TYLENOL) 325 MG tablet Take 650 mg by mouth daily as needed (pain).   . AMBULATORY NON FORMULARY MEDICATION Take 180 mg by mouth daily. Medication Name: bempedoic acid 180 mg vs a placebo.  CLEAR Research Study, drug provided  . apixaban (ELIQUIS) 5 MG TABS tablet Take 1 tablet (5 mg total) by mouth 2 (two) times daily.  Marland Kitchen aspirin EC 81 MG tablet Take 81 mg by mouth at bedtime.  . cholecalciferol (VITAMIN D) 1000 UNITS tablet Take 1,000 Units by mouth daily.  . fluticasone (FLONASE) 50 MCG/ACT nasal spray Place 2 sprays into both nostrils daily.  . metoprolol succinate (TOPROL-XL) 50 MG 24 hr tablet Take 1 tablet (50 mg total) 2 (two) times daily by mouth. Take with or immediately following a meal.  .  nitroGLYCERIN (NITROSTAT) 0.4 MG SL tablet Place 1 tablet (0.4 mg total) under the tongue every 5 (five) minutes as needed for chest pain (up to 3 doses).  . pantoprazole (PROTONIX) 40 MG tablet Take 1 tablet (40 mg total) by mouth daily.  . propafenone (RYTHMOL) 150 MG tablet Take 1 tablet (150 mg total) by mouth 2 (two) times daily.  . temazepam (RESTORIL) 15 MG capsule Take 1 capsule (15 mg total) by mouth at bedtime as needed for sleep.  Marland Kitchen Zoster Vaccine Adjuvanted [facility]) injection Inject 0.5 mLs into the muscle once for 1 dose.   No facility-administered encounter medications on file as of 05/02/2017.     Allergies (verified) No known allergies; Other; Statins; and Zetia [ezetimibe]   History: Past Medical History:  Diagnosis Date  . Anginal pain ([place])   . Arthritis   . CAD (coronary artery disease)    a. prior stenting history. b. NSTEMI s/p DES to prox LAD with EF 45% by cath 08/14/12. c. Mild trop elevation shortly after cath ?mild plaque embolization - patent stent on relook. // d. Myoview 8/18: EF 60, normal perfusion; Low Risk  . Fatty infiltration of liver   . GERD (gastroesophageal reflux disease)   . Hyperlipidemia   . Hypertension   . Myocardial infarction ([place]) 2014  . OSA on CPAP   . Osteoporosis   . PAF (paroxysmal atrial fibrillation) ([place])  On rythmol.  . Pulmonary nodule    a. 22mm subpleural nodular density CT 08/2012, instructed to f/u pulm MD.  . Tubular adenoma of colon 2007   Past Surgical History:  Procedure Laterality Date  . CORONARY ANGIOPLASTY WITH STENT PLACEMENT  08/14/2012   LAD  Dr [name]  . CORONARY STENT PLACEMENT  2000  . hair restoration  02/2017  . LEFT HEART CATHETERIZATION WITH CORONARY ANGIOGRAM N/A 08/14/2012   Procedure: LEFT HEART CATHETERIZATION WITH CORONARY ANGIOGRAM;  Surgeon: [name], MD;  Location: [facility] CATH LAB;  Service: Cardiovascular;  Laterality: N/A;  . LEFT HEART CATHETERIZATION WITH CORONARY ANGIOGRAM  N/A 08/19/2012   Procedure: LEFT HEART CATHETERIZATION WITH CORONARY ANGIOGRAM;  Surgeon: [name], MD;  Location: [facility] CATH LAB;  Service: Cardiovascular;  Laterality: N/A;  . ROTATOR CUFF REPAIR    . TONSILLECTOMY    . TOTAL ABDOMINAL HYSTERECTOMY     Family History  Problem Relation Age of Onset  . Heart disease Father   . Alzheimer's disease Mother   . Breast cancer Maternal Aunt   . Colon cancer Neg Hx    Social History   Occupational History  . 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 Smoking status: Never Smoker  . Smokeless tobacco: Never Used  Substance and Sexual Activity  . Alcohol use: Yes    Comment: 2 drinks daily   . Drug use: No  . Sexual activity: Not Currently    Tobacco Counseling Counseling given: Not Answered   Activities of Daily Living In your present state of health, do you have any difficulty performing the following activities: 05/02/2017 07/10/2016  Hearing? Y N  Vision? N Y  Comment - pt has had cataract surgery  Difficulty concentrating or making decisions? Y N  Walking or climbing stairs? N N  Dressing or bathing? N N  Doing errands, shopping? N N  Preparing Food and eating ? N -  Using the Toilet? N -  In the past six months, have you accidently leaked urine? N -  Do you have problems with loss of bowel control? N -  Managing your Medications? N -  Managing your Finances? N -  Housekeeping or managing your Housekeeping? N -  Some recent data might be hidden    Immunizations and Health Maintenance Immunization History  Administered Date(s) Administered  . Influenza Split 03/06/2011, 03/05/2012  . Influenza Whole 04/06/2010  . Influenza, High Dose Seasonal PF 01/04/2016  . Influenza,inj,Quad PF,6+ Mos 03/26/2017  . Pneumococcal Conjugate-13 07/10/2016  . Pneumococcal Polysaccharide-23 08/15/2012  . Zoster 07/10/2012   There are no preventive care reminders  to display for this patient.  Patient Care Team: [name], MD as PCP - General (Family Medicine) Key, Nelia Shi, NP as Nurse Practitioner (Gynecology) Oscar La, grant (Dermatology) Renda Rolls, Jennefer Bravo, MD as Referring Physician (Dermatology) Nahser, Wonda Cheng, MD as Consulting Physician (Cardiology)  Indicate any recent Medical Services you may have received from other than [facility] providers in the past year (date may be approximate).     Assessment:   This is a routine wellness examination for Jacky. Physical assessment deferred to PCP.   Hearing/Vision screen  Hearing Screening   Method: Audiometry   125Hz  250Hz  500Hz  1000Hz  2000Hz  3000Hz  4000Hz  6000Hz  8000Hz   Right ear:   Pass Pass Pass  Pass    Left ear:   Pass Fail Fail  Fail    Comments: Referral to Audiology ordered.   Vision Screening Comments: Last exam > 1 year. Dr. Oswaldo Conroy. Wears glasses. H/O cataract surgery.   Dietary issues and exercise activities discussed: Current Exercise Habits: The patient does not participate in regular exercise at present, Exercise limited by: None identified   Diet (meal preparation, eat out, water intake, caffeinated beverages, dairy products, fruits and vegetables): Drinks water and honey tea.   Eats heart healthy majority of time.   Goals    . LDL CALC < 70    . Weight (lb) < 125 lb (56.7 kg)     Lose weight by increasing activity.       Depression Screen PHQ 2/9 Scores 05/02/2017 07/10/2016 12/04/2012 09/02/2012  PHQ - 2 Score 0 0 0 0  PHQ- 9 Score - 0 - -    Fall Risk Fall Risk  05/02/2017 07/10/2016  Falls in the past year? Yes No  Number falls in past yr: 1 -  Injury with Fall? No -  Follow up Falls prevention discussed -    Cognitive Function: MMSE - Mini Mental State Exam 05/02/2017  Orientation to time 5  Orientation to Place 5  Registration 2  Attention/ Calculation 5  Recall 3  Language- name 2 objects 2  Language- repeat 1  Language- follow 3 step  command 3  Language- read & follow direction 1  Write a sentence 1  Copy design 1  Total score 29        Screening Tests Health Maintenance  Topic Date Due  . DEXA SCAN  05/02/2018 (Originally 06/29/2011)  . TETANUS/TDAP  05/02/2018 (Originally [birth year]28/1965)  . Hepatitis C Screening  05/02/2018 (Originally 09-Oct-1945)  . MAMMOGRAM  12/21/2017  . COLONOSCOPY  03/08/2019  . INFLUENZA VACCINE  Completed  . PNA vac Low Risk Adult  Completed      Plan:     Schedule Audiology appointment.   Bring a copy of your living will and/or healthcare power of attorney to your next office visit.  Continue doing brain stimulating activities (puzzles, reading, adult coloring books, staying active) to keep memory sharp.   I have personally reviewed and noted the following in the patient's chart:   . Medical and social history . Use of alcohol, tobacco or illicit drugs  . Current medications and supplements . Functional ability and status . Nutritional status . Physical activity . Advanced directives . List of other physicians . Hospitalizations, surgeries, and ER visits in previous 12 months . Vitals . Screenings to include cognitive, depression, and falls . Referrals and appointments  In addition, I have reviewed and discussed with patient certain preventive protocols, quality metrics, and best practice recommendations. A written personalized care plan for preventive services as well as general preventive health recommendations were provided to patient.     Gerilyn Nestle, RN   05/02/2017   Reviewed documentation provided by RN.  Agree w/ above.  Annye Asa, MD

## 2017-05-02 ENCOUNTER — Ambulatory Visit: Payer: Medicare Other

## 2017-05-02 ENCOUNTER — Other Ambulatory Visit: Payer: Self-pay

## 2017-05-02 ENCOUNTER — Encounter: Payer: Self-pay | Admitting: Family Medicine

## 2017-05-02 ENCOUNTER — Ambulatory Visit (INDEPENDENT_AMBULATORY_CARE_PROVIDER_SITE_OTHER): Payer: Medicare Other | Admitting: Family Medicine

## 2017-05-02 VITALS — BP 112/70 | HR 54 | Temp 97.7°F | Resp 18 | Ht 60.0 in | Wt 141.0 lb

## 2017-05-02 DIAGNOSIS — I25119 Atherosclerotic heart disease of native coronary artery with unspecified angina pectoris: Secondary | ICD-10-CM | POA: Diagnosis not present

## 2017-05-02 DIAGNOSIS — Z0000 Encounter for general adult medical examination without abnormal findings: Secondary | ICD-10-CM | POA: Diagnosis not present

## 2017-05-02 DIAGNOSIS — K219 Gastro-esophageal reflux disease without esophagitis: Secondary | ICD-10-CM | POA: Diagnosis not present

## 2017-05-02 DIAGNOSIS — I10 Essential (primary) hypertension: Secondary | ICD-10-CM | POA: Diagnosis not present

## 2017-05-02 DIAGNOSIS — R94120 Abnormal auditory function study: Secondary | ICD-10-CM | POA: Diagnosis not present

## 2017-05-02 DIAGNOSIS — E785 Hyperlipidemia, unspecified: Secondary | ICD-10-CM | POA: Diagnosis not present

## 2017-05-02 DIAGNOSIS — Z23 Encounter for immunization: Secondary | ICD-10-CM

## 2017-05-02 LAB — HEPATIC FUNCTION PANEL
ALBUMIN: 4.3 g/dL (ref 3.5–5.2)
ALT: 24 U/L (ref 0–35)
AST: 22 U/L (ref 0–37)
Alkaline Phosphatase: 71 U/L (ref 39–117)
Bilirubin, Direct: 0.1 mg/dL (ref 0.0–0.3)
Total Bilirubin: 0.7 mg/dL (ref 0.2–1.2)
Total Protein: 7.1 g/dL (ref 6.0–8.3)

## 2017-05-02 LAB — BASIC METABOLIC PANEL
BUN: 15 mg/dL (ref 6–23)
CHLORIDE: 101 meq/L (ref 96–112)
CO2: 31 mEq/L (ref 19–32)
Calcium: 9.2 mg/dL (ref 8.4–10.5)
Creatinine, Ser: 0.84 mg/dL (ref 0.40–1.20)
GFR: 71.07 mL/min (ref >60.00)
GLUCOSE: 82 mg/dL (ref 70–99)
Potassium: 4.1 mEq/L (ref 3.5–5.1)
Sodium: 139 mEq/L (ref 135–145)

## 2017-05-02 LAB — CBC WITH DIFFERENTIAL/PLATELET
BASOS ABS: 0 10*3/uL (ref 0.0–0.1)
Basophils Relative: 0.4 % (ref 0.0–3.0)
EOS PCT: 2 % (ref 0.0–5.0)
Eosinophils Absolute: 0.1 10*3/uL (ref 0.0–0.7)
HCT: 46.6 % — ABNORMAL HIGH (ref 36.0–46.0)
Hemoglobin: 15.3 g/dL — ABNORMAL HIGH (ref 12.0–15.0)
LYMPHS ABS: 2.5 10*3/uL (ref 0.7–4.0)
Lymphocytes Relative: 42.2 % (ref 12.0–46.0)
MCHC: 32.9 g/dL (ref 30.0–36.0)
MCV: 93.9 fl (ref 78.0–100.0)
MONO ABS: 0.6 10*3/uL (ref 0.1–1.0)
Monocytes Relative: 10.1 % (ref 3.0–12.0)
NEUTROS PCT: 45.3 % (ref 43.0–77.0)
Neutro Abs: 2.7 10*3/uL (ref 1.4–7.7)
Platelets: 207 10*3/uL (ref 150.0–400.0)
RBC: 4.96 Mil/uL (ref 3.87–5.11)
RDW: 13.2 % (ref 11.5–15.5)
WBC: 6 10*3/uL (ref 4.0–10.5)

## 2017-05-02 LAB — TSH: TSH: 4.42 u[IU]/mL (ref 0.35–4.50)

## 2017-05-02 MED ORDER — ZOSTER VAC RECOMB ADJUVANTED 50 MCG/0.5ML IM SUSR: 0.5000 mL | Freq: Once | INTRAMUSCULAR | 1 refills | Status: AC

## 2017-05-02 MED ORDER — PANTOPRAZOLE SODIUM 40 MG PO TBEC: 40.0000 mg | DELAYED_RELEASE_TABLET | Freq: Every day | ORAL | 6 refills | Status: DC

## 2017-05-02 NOTE — Assessment & Plan Note (Signed)
Deteriorated.  Pt is only taking meds every 3 days and having breakthrough sxs.  Encouraged her to ask for refills if needed.  Reviewed lifestyle and dietary modifications.  Will follow.

## 2017-05-02 NOTE — Progress Notes (Signed)
   Subjective:    Patient ID: Shannon Cummings, female    DOB: [birth year], 71 y.o.   MRN: [id number]  HPI GERD- ongoing issue for pt.  Currently taking Protonix q3 days bc 'I didn't have enough to take every day'.  She did not think to ask for a refill.    HTN- chronic problem, on Metoprolol 50mg  daily.  No CP, SOB, HAs, visual changes, edema.  Hyperlipidemia- last LDL was 169.  In a 'blind study'.  She is having labs done through the study and we are not to be checking them.   Review of Systems For ROS see HPI     Objective:   Physical Exam  Constitutional: She is oriented to person, place, and time. She appears well-developed and well-nourished. No distress.  HENT:  Head: Normocephalic and atraumatic.  Eyes: Conjunctivae and EOM are normal. Pupils are equal, round, and reactive to light.  Neck: Normal range of motion. Neck supple. No thyromegaly present.  Cardiovascular: Normal rate, regular rhythm, normal heart sounds and intact distal pulses.  No murmur heard. Pulmonary/Chest: Effort normal and breath sounds normal. No respiratory distress.  Abdominal: Soft. She exhibits no distension. There is no tenderness.  Musculoskeletal: She exhibits no edema.  Lymphadenopathy:    She has no cervical adenopathy.  Neurological: She is alert and oriented to person, place, and time.  Skin: Skin is warm and dry.  Psychiatric: She has a normal mood and affect. Her behavior is normal.          Assessment & Plan:

## 2017-05-02 NOTE — Assessment & Plan Note (Signed)
Chronic problem.  Well controlled today.  Asymptomatic.  Check labs.  No anticipated med changes.  Will follow. 

## 2017-05-02 NOTE — Patient Instructions (Addendum)
Follow up in 6 months to recheck BP We'll notify you of your lab results and make any changes if needed Keep up the good work on healthy diet and regular exercise- you look great!!! Use the Protonix daily- call if you need a refill Call with any questions or concerns Happy Holidays!!!  Schedule Audiology appointment.   Bring a copy of your living will and/or healthcare power of attorney to your next office visit.  Continue doing brain stimulating activities (puzzles, reading, adult coloring books, staying active) to keep memory sharp.   Health Maintenance, Female Adopting a healthy lifestyle and getting preventive care can go a long way to promote health and wellness. Talk with your health care provider about what schedule of regular examinations is right for you. This is a good chance for you to check in with your provider about disease prevention and staying healthy. In between checkups, there are plenty of things you can do on your own. Experts have done a lot of research about which lifestyle changes and preventive measures are most likely to keep you healthy. Ask your health care provider for more information. Weight and diet Eat a healthy diet  Be sure to include plenty of vegetables, fruits, low-fat dairy products, and lean protein.  Do not eat a lot of foods high in solid fats, added sugars, or salt.  Get regular exercise. This is one of the most important things you can do for your health. ? Most adults should exercise for at least 150 minutes each week. The exercise should increase your heart rate and make you sweat (moderate-intensity exercise). ? Most adults should also do strengthening exercises at least twice a week. This is in addition to the moderate-intensity exercise.  Maintain a healthy weight  Body mass index (BMI) is a measurement that can be used to identify possible weight problems. It estimates body fat based on height and weight. Your health care provider can  help determine your BMI and help you achieve or maintain a healthy weight.  For females 93 years of age and older: ? A BMI below 18.5 is considered underweight. ? A BMI of 18.5 to 24.9 is normal. ? A BMI of 25 to 29.9 is considered overweight. ? A BMI of 30 and above is considered obese.  Watch levels of cholesterol and blood lipids  You should start having your blood tested for lipids and cholesterol at 70 years of age, then have this test every 5 years.  You may need to have your cholesterol levels checked more often if: ? Your lipid or cholesterol levels are high. ? You are older than 70 years of age. ? You are at high risk for heart disease.  Cancer screening Lung Cancer  Lung cancer screening is recommended for adults 79-77 years old who are at high risk for lung cancer because of a history of smoking.  A yearly low-dose CT scan of the lungs is recommended for people who: ? Currently smoke. ? Have quit within the past 15 years. ? Have at least a 30-pack-year history of smoking. A pack year is smoking an average of one pack of cigarettes a day for 1 year.  Yearly screening should continue until it has been 15 years since you quit.  Yearly screening should stop if you develop a health problem that would prevent you from having lung cancer treatment.  Breast Cancer  Practice breast self-awareness. This means understanding how your breasts normally appear and feel.  It also means doing  regular breast self-exams. Let your health care provider know about any changes, no matter how small.  If you are in your 20s or 30s, you should have a clinical breast exam (CBE) by a health care provider every 1-3 years as part of a regular health exam.  If you are 55 or older, have a CBE every year. Also consider having a breast X-ray (mammogram) every year.  If you have a family history of breast cancer, talk to your health care provider about genetic screening.  If you are at high risk  for breast cancer, talk to your health care provider about having an MRI and a mammogram every year.  Breast cancer gene (BRCA) assessment is recommended for women who have family members with BRCA-related cancers. BRCA-related cancers include: ? Breast. ? Ovarian. ? Tubal. ? Peritoneal cancers.  Results of the assessment will determine the need for genetic counseling and BRCA1 and BRCA2 testing.  Cervical Cancer Your health care provider may recommend that you be screened regularly for cancer of the pelvic organs (ovaries, uterus, and vagina). This screening involves a pelvic examination, including checking for microscopic changes to the surface of your cervix (Pap test). You may be encouraged to have this screening done every 3 years, beginning at age 6.  For women ages 55-65, health care providers may recommend pelvic exams and Pap testing every 3 years, or they may recommend the Pap and pelvic exam, combined with testing for human papilloma virus (HPV), every 5 years. Some types of HPV increase your risk of cervical cancer. Testing for HPV may also be done on women of any age with unclear Pap test results.  Other health care providers may not recommend any screening for nonpregnant women who are considered low risk for pelvic cancer and who do not have symptoms. Ask your health care provider if a screening pelvic exam is right for you.  If you have had past treatment for cervical cancer or a condition that could lead to cancer, you need Pap tests and screening for cancer for at least 20 years after your treatment. If Pap tests have been discontinued, your risk factors (such as having a new sexual partner) need to be reassessed to determine if screening should resume. Some women have medical problems that increase the chance of getting cervical cancer. In these cases, your health care provider may recommend more frequent screening and Pap tests.  Colorectal Cancer  This type of cancer can be  detected and often prevented.  Routine colorectal cancer screening usually begins at 70 years of age and continues through 70 years of age.  Your health care provider may recommend screening at an earlier age if you have risk factors for colon cancer.  Your health care provider may also recommend using home test kits to check for hidden blood in the stool.  A small camera at the end of a tube can be used to examine your colon directly (sigmoidoscopy or colonoscopy). This is done to check for the earliest forms of colorectal cancer.  Routine screening usually begins at age 39.  Direct examination of the colon should be repeated every 5-10 years through 70 years of age. However, you may need to be screened more often if early forms of precancerous polyps or small growths are found.  Skin Cancer  Check your skin from head to toe regularly.  Tell your health care provider about any new moles or changes in moles, especially if there is a change in a mole's  shape or color.  Also tell your health care provider if you have a mole that is larger than the size of a pencil eraser.  Always use sunscreen. Apply sunscreen liberally and repeatedly throughout the day.  Protect yourself by wearing long sleeves, pants, a wide-brimmed hat, and sunglasses whenever you are outside.  Heart disease, diabetes, and high blood pressure  High blood pressure causes heart disease and increases the risk of stroke. High blood pressure is more likely to develop in: ? People who have blood pressure in the high end of the normal range (130-139/85-89 mm Hg). ? People who are overweight or obese. ? People who are African American.  If you are 15-73 years of age, have your blood pressure checked every 3-5 years. If you are 31 years of age or older, have your blood pressure checked every year. You should have your blood pressure measured twice-once when you are at a hospital or clinic, and once when you are not at a  hospital or clinic. Record the average of the two measurements. To check your blood pressure when you are not at a hospital or clinic, you can use: ? An automated blood pressure machine at a pharmacy. ? A home blood pressure monitor.  If you are between 76 years and 28 years old, ask your health care provider if you should take aspirin to prevent strokes.  Have regular diabetes screenings. This involves taking a blood sample to check your fasting blood sugar level. ? If you are at a normal weight and have a low risk for diabetes, have this test once every three years after 70 years of age. ? If you are overweight and have a high risk for diabetes, consider being tested at a younger age or more often. Preventing infection Hepatitis B  If you have a higher risk for hepatitis B, you should be screened for this virus. You are considered at high risk for hepatitis B if: ? You were born in a country where hepatitis B is common. Ask your health care provider which countries are considered high risk. ? Your parents were born in a high-risk country, and you have not been immunized against hepatitis B (hepatitis B vaccine). ? You have HIV or AIDS. ? You use needles to inject street drugs. ? You live with someone who has hepatitis B. ? You have had sex with someone who has hepatitis B. ? You get hemodialysis treatment. ? You take certain medicines for conditions, including cancer, organ transplantation, and autoimmune conditions.  Hepatitis C  Blood testing is recommended for: ? Everyone born from [birth year] through 1964. ? Anyone with known risk factors for hepatitis C.  Sexually transmitted infections (STIs)  You should be screened for sexually transmitted infections (STIs) including gonorrhea and chlamydia if: ? You are sexually active and are younger than 70 years of age. ? You are older than 70 years of age and your health care provider tells you that you are at risk for this type of  infection. ? Your sexual activity has changed since you were last screened and you are at an increased risk for chlamydia or gonorrhea. Ask your health care provider if you are at risk.  If you do not have HIV, but are at risk, it may be recommended that you take a prescription medicine daily to prevent HIV infection. This is called pre-exposure prophylaxis (PrEP). You are considered at risk if: ? You are sexually active and do not regularly use condoms or know  the HIV status of your partner(s). ? You take drugs by injection. ? You are sexually active with a partner who has HIV.  Talk with your health care provider about whether you are at high risk of being infected with HIV. If you choose to begin PrEP, you should first be tested for HIV. You should then be tested every 3 months for as long as you are taking PrEP. Pregnancy  If you are premenopausal and you may become pregnant, ask your health care provider about preconception counseling.  If you may become pregnant, take 400 to 800 micrograms (mcg) of folic acid every day.  If you want to prevent pregnancy, talk to your health care provider about birth control (contraception). Osteoporosis and menopause  Osteoporosis is a disease in which the bones lose minerals and strength with aging. This can result in serious bone fractures. Your risk for osteoporosis can be identified using a bone density scan.  If you are 73 years of age or older, or if you are at risk for osteoporosis and fractures, ask your health care provider if you should be screened.  Ask your health care provider whether you should take a calcium or vitamin D supplement to lower your risk for osteoporosis.  Menopause may have certain physical symptoms and risks.  Hormone replacement therapy may reduce some of these symptoms and risks. Talk to your health care provider about whether hormone replacement therapy is right for you. Follow these instructions at home:  Schedule  regular health, dental, and eye exams.  Stay current with your immunizations.  Do not use any tobacco products including cigarettes, chewing tobacco, or electronic cigarettes.  If you are pregnant, do not drink alcohol.  If you are breastfeeding, limit how much and how often you drink alcohol.  Limit alcohol intake to no more than 1 drink per day for nonpregnant women. One drink equals 12 ounces of beer, 5 ounces of wine, or 1 ounces of hard liquor.  Do not use street drugs.  Do not share needles.  Ask your health care provider for help if you need support or information about quitting drugs.  Tell your health care provider if you often feel depressed.  Tell your health care provider if you have ever been abused or do not feel safe at home. This information is not intended to replace advice given to you by your health care provider. Make sure you discuss any questions you have with your health care provider. Document Released: 12/05/2010 Document Revised: 10/28/2015 Document Reviewed: 02/23/2015 Elsevier Interactive Patient Education  [employer].

## 2017-05-02 NOTE — Assessment & Plan Note (Signed)
Currently in a study.  Unable to check lipids but pt is having these done regularly.

## 2017-05-03 ENCOUNTER — Encounter: Payer: Self-pay | Admitting: General Practice

## 2017-05-08 ENCOUNTER — Ambulatory Visit (INDEPENDENT_AMBULATORY_CARE_PROVIDER_SITE_OTHER): Payer: Medicare Other | Admitting: Cardiovascular Disease

## 2017-05-08 ENCOUNTER — Encounter: Payer: Self-pay | Admitting: Cardiovascular Disease

## 2017-05-08 VITALS — BP 152/80 | HR 53 | Ht 61.0 in | Wt 141.8 lb

## 2017-05-08 DIAGNOSIS — I209 Angina pectoris, unspecified: Secondary | ICD-10-CM | POA: Diagnosis not present

## 2017-05-08 DIAGNOSIS — I25119 Atherosclerotic heart disease of native coronary artery with unspecified angina pectoris: Secondary | ICD-10-CM | POA: Diagnosis not present

## 2017-05-08 DIAGNOSIS — I480 Paroxysmal atrial fibrillation: Secondary | ICD-10-CM

## 2017-05-08 DIAGNOSIS — I10 Essential (primary) hypertension: Secondary | ICD-10-CM

## 2017-05-08 NOTE — Patient Instructions (Addendum)
Medication Instructions:  Your physician recommends that you continue on your current medications as directed. Please refer to the Current Medication list given to you today. **Call Sharyn Lull and report dose of Metoprolol  Labwork: None Ordered   Testing/Procedures: None Ordered   Follow-Up: HOW TO TAKE YOUR BLOOD PRESSURE:  Rest 5 minutes before taking your blood pressure.   Don't smoke or drink caffeinated beverages for at least 30 minutes before.  Take your blood pressure before (not after) you eat.  Sit comfortably with your back supported and both feet on the floor (don't cross your legs).  Elevate your arm to heart level on a table or a desk.  Use the proper sized cuff. It should fit smoothly and snugly around your bare upper arm. There should be enough room to slip a fingertip under the cuff. The bottom edge of the cuff should be 1 inch above the crease of the elbow.   Your physician recommends that you schedule a follow-up appointment in: 3 months with Dr. Acie Fredrickson    If you need a refill on your cardiac medications before your next appointment, please call your pharmacy.   Thank you for choosing [facility]! Christen Bame, RN [phone]

## 2017-05-08 NOTE — Progress Notes (Signed)
Cardiology Office Note   Date:  05/08/2017   ID:  Shannon Cummings, DOB 05/12/1946, MRN [id number]  PCP:  [name], MD  Cardiologist:   [name], MD   Chief Complaint  Patient presents with  . Follow-up    cad, PAF, HTN   1. Coronary artery disease-status post stenting in the past. She status post stenting of her proximal LAD 08/14/2012 2. Intermittent atrial fibrillation 3.  hyperlipidemia-she has been generally intolerant to most statins  History of Present Illness:  Pt is doing well. No cardiac complaints.   She complains of some tingling and numbness associated with the Crestor. She has discontinued the Crestor because of that reason. She has had reactions to most statins that we have tried.  October 08-2011  She has done well. She has not had any angina or eposides of atrial fib. She has retired and is traveling quite a bit. Her husband has also retired. She has not had any problems.  October 01, 2012:  She is having some bruising ( due to [place]). She is enjoying the cardiac rehab. She has noticed some low BP readings at the end of cardiac rehab. She does not feel bad. These low readings are typically asymptomatic.   No angina. No arrhythmias.   August 20, 2013:  Shannon Cummings is doing ok. She was admitted to the hospital with some CP recently. Rule out for MI.  She has been doing [place]. Has gained a bit of weight. She is not able to exercise because Of left ankle problems.    August 19, 2014:   Shannon Cummings is a 71 y.o. female who presents for follow up of her atrial fib. Has gained some weight.  She is in the study for lipid management Has had leg cramps since last fall. Off and on  Has had more paroxysmal atrial fib.  Lasts about a minute. Seem to be occuring more frequently.   Jan. 18, 2017:  Shannon Cummings presents for follow up of her CAD and atrial fib.  Doing well.   Has visited [place] recently .   No CP or dyspnea.     Tolerates the Rythmol.  Talked about her daughter who lives in [place].  Is not having any significant atrial fib issues.   Has been on lipid lowering study drug at [facility]  ( injectable cholesterol medication )   Jan.   25, 2018:  Doing well.   Feeling well Has had a stomach bug last week and now has a URI  - not the flu.   Has maintained NSR .  Is having hair loss.  Her research shows that amlodipine and atenolol can cause this   Aug. 23, 2018:  Shannon Cummings is seen today for eval of some elevated BP Has been having pain in her back, tightness in her chest  Went to ER.  Has had elevated BP for a week,   Went back to ER  Doubled her metoprolol XL .  Saw Richardson Dopp on Aug. 6. 2018 Was given Imdur - developed a headache that lasted 3 days  Lexiscan myoview was low risk.   Her back pain / chest tightness has resolved.   May 08, 2017: Shannon Cummings seen today for follow-up of her hypertension, coronary artery disease, paroxysmal atrial fibrillation. Is feeling well  Is taking the Toprol XL just once a day - not BID as listed in MAR .   Past Medical History:  Diagnosis Date  . Anginal pain ([place])   .  Arthritis   . CAD (coronary artery disease)    a. prior stenting history. b. NSTEMI s/p DES to prox LAD with EF 45% by cath 08/14/12. c. Mild trop elevation shortly after cath ?mild plaque embolization - patent stent on relook. // d. Myoview 8/18: EF 60, normal perfusion; Low Risk  . Fatty infiltration of liver   . GERD (gastroesophageal reflux disease)   . Hyperlipidemia   . Hypertension   . Myocardial infarction ([place]) 2014  . OSA on CPAP   . Osteoporosis   . PAF (paroxysmal atrial fibrillation) ([place])    On rythmol.  . Pulmonary nodule    a. 62mm subpleural nodular density CT 08/2012, instructed to f/u pulm MD.  . Tubular adenoma of colon 2007    Past Surgical History:  Procedure Laterality Date  . CORONARY ANGIOPLASTY WITH STENT PLACEMENT  08/14/2012   LAD  Dr [name]  . CORONARY STENT PLACEMENT  2000  . hair restoration  02/2017  . LEFT HEART CATHETERIZATION WITH CORONARY ANGIOGRAM N/A 08/14/2012   Procedure: LEFT HEART CATHETERIZATION WITH CORONARY ANGIOGRAM;  Surgeon: [name], MD;  Location: [facility] CATH LAB;  Service: Cardiovascular;  Laterality: N/A;  . LEFT HEART CATHETERIZATION WITH CORONARY ANGIOGRAM N/A 08/19/2012   Procedure: LEFT HEART CATHETERIZATION WITH CORONARY ANGIOGRAM;  Surgeon: [name], MD;  Location: [facility] C[facility] LAB;  Service: Cardiovascular;  Laterality: N/A;  . ROTATOR CUFF REPAIR    . TONSILLECTOMY    . TOTAL ABDOMINAL HYSTERECTOMY       Current Outpatient Medications  Medication Sig Dispense Refill  . acetaminophen (TYLENOL) 325 MG tablet Take 650 mg by mouth daily as needed (pain).     . AMBULATORY NON FORMULARY MEDICATION Take 180 mg by mouth daily. Medication Name: bempedoic acid 180 mg vs a placebo.  CLEAR Research Study, drug provided    . apixaban (ELIQUIS) 5 MG TABS tablet Take 1 tablet (5 mg total) by mouth 2 (two) times daily. 180 tablet 3  . aspirin EC 81 MG tablet Take 81 mg by mouth at bedtime.    . cholecalciferol (VITAMIN D) 1000 UNITS tablet Take 1,000 Units by mouth daily.    . fluticasone (FLONASE) 50 MCG/ACT nasal spray Place 2 sprays into both nostrils daily. 16 g 6  . metoprolol succinate (TOPROL-XL) 50 MG 24 hr tablet Take 1 tablet (50 mg total) 2 (two) times daily by mouth. Take with or immediately following a meal. 60 tablet 9  . nitroGLYCERIN (NITROSTAT) 0.4 MG SL tablet Place 1 tablet (0.4 mg total) under the tongue every 5 (five) minutes as needed for chest pain (up to 3 doses). 25 tablet 6  . pantoprazole (PROTONIX) 40 MG tablet Take 1 tablet (40 mg total) by mouth daily. 30 tablet 6  . propafenone (RYTHMOL) 150 MG tablet Take 1 tablet (150 mg total) by mouth 2 (two) times daily. 180 tablet 3  . temazepam (RESTORIL) 15 MG capsule Take 1 capsule (15 mg total) by mouth at bedtime as needed for  sleep. 30 capsule 5   No current facility-administered medications for this visit.     Allergies:   No known allergies; Other; Statins; and Zetia [ezetimibe]    Social History:  The patient  reports that  has never smoked. she has never used smokeless tobacco. She reports that she drinks alcohol. She reports that she does not use drugs.   Family History:  The patient's family history includes Alzheimer's disease in her mother; Breast cancer in her  maternal aunt; Heart disease in her father.    ROS: Noted in current history.  All other systems are negative.  Physical Exam: Blood pressure (!) 152/80, pulse (!) 53, height 5\' 1"  (1.549 m), weight 141 lb 12.8 oz (64.3 kg).  GEN:  Well nourished, well developed in no acute distress HEENT: Normal NECK: No JVD; No carotid bruits LYMPHATICS: No lymphadenopathy CARDIAC: RR, no murmurs, rubs, gallops RESPIRATORY:  Clear to auscultation without rales, wheezing or rhonchi  ABDOMEN: Soft, non-tender, non-distended MUSCULOSKELETAL:  No edema; No deformity  SKIN: Warm and dry NEUROLOGIC:  Alert and oriented x 3    EKG:   .    Recent Labs: 05/02/2017: ALT 24; BUN 15; Creatinine, Ser 0.84; Hemoglobin 15.3; Platelets 207.0; Potassium 4.1; Sodium 139; TSH 4.42    Lipid Panel    Component Value Date/Time   CHOL 256 (H) 06/23/2015 1153   TRIG 215 (H) 06/23/2015 1153   HDL 44 (L) 06/23/2015 1153   CHOLHDL 5.8 (H) 06/23/2015 1153   VLDL 43 (H) 06/23/2015 1153   LDLCALC 169 (H) 06/23/2015 1153   LDLDIRECT 174.0 12/31/2012 0754      Wt Readings from Last 3 Encounters:  05/08/17 141 lb 12.8 oz (64.3 kg)  05/02/17 141 lb (64 kg)  01/25/17 141 lb 12.8 oz (64.3 kg)      Other studies Reviewed: Additional studies/ records that were reviewed today include: . Review of the above records demonstrates:    ASSESSMENT AND PLAN:  1. Coronary artery disease- . No angina   2. Intermittent atrial fibrillation -    Has maintained NSR on  propafenol  3.  hyperlipidemia-  .she is in a study for hyperlipidemia    4. Essential HTN:    Pressure remains a little elevated today.  She has not been exercising.  I advised her to watch her salt and exercise regularly. She will monitor blood pressure daily for the next 3 months.  I will see her in 3 months for follow-up visit and will decide whether she needs another blood pressure medication.  I'll see her in 3 months    Current medicines are reviewed at length with the patient today.  The patient does not have concerns regarding medicines.  The following changes have been made:  no change  Labs/ tests ordered today include:   No orders of the defined types were placed in this encounter.   Signed, [name], MD  05/08/2017 11:00 AM    [facility] [address], [place], [place]  [address] Phone: [phone]; Fax: [phone]

## 2017-05-10 ENCOUNTER — Telehealth: Payer: Self-pay | Admitting: Nurse Practitioner

## 2017-05-10 DIAGNOSIS — I10 Essential (primary) hypertension: Secondary | ICD-10-CM

## 2017-05-10 MED ORDER — POTASSIUM CHLORIDE ER 10 MEQ PO TBCR: 10.0000 meq | EXTENDED_RELEASE_TABLET | Freq: Every day | ORAL | 3 refills | Status: DC

## 2017-05-10 MED ORDER — HYDROCHLOROTHIAZIDE 25 MG PO TABS: 25.0000 mg | ORAL_TABLET | Freq: Every day | ORAL | 3 refills | Status: DC

## 2017-05-10 MED ORDER — LOSARTAN POTASSIUM 25 MG PO TABS: 25.0000 mg | ORAL_TABLET | Freq: Every day | ORAL | 3 refills | Status: DC

## 2017-05-10 MED ORDER — METOPROLOL SUCCINATE ER 50 MG PO TB24: 50.0000 mg | ORAL_TABLET | Freq: Every day | ORAL | 3 refills | Status: DC

## 2017-05-10 NOTE — Telephone Encounter (Signed)
Spoke with pharmacy technician at [facility] and asked for prescriptions to be filled as quickly as possible. She states she will have those ready by 4 pm and I thanked her for her help.

## 2017-05-10 NOTE — Telephone Encounter (Signed)
Spoke with patient who called back to clarify her metoprolol dose (there was a question from her last ov on 12/4). She states she has been taking Toprol XL 50  Mg once daily. Her prescription was for Toprol XL 50 mg BID. I advised that since she has been taking it once daily and her HR at last ov was 53 bpm that Dr. Acie Fredrickson may want to change the Rx to 50 mg once daily. She states her BP continues to measure high at home. I discussed with Dr. Acie Fredrickson, who is in the office today, and advised her to start Losartan 25 mg once daily and continue to monitor HR and BP. I advised her to call back if BP is consistently > 140/80 mmHg. She states she has gotten some high readings at home of >180/80 mmHg. We discussed proper BP technique and she states she will go ahead and change the batteries in her machine. She reports she follows a low sodium diet. I advised her to call back with elevated BP and/or HR or with other concerns and she thanked me for my help.

## 2017-05-10 NOTE — Telephone Encounter (Signed)
Patient called back to report that she has gone to [facility] to have her BP taken by pharmacist and reading was 208/114 mmHg. She states her Losartan was not ready for her to pick up so her husband drove her back home. She states she had a headache earlier today but not now; denies visual change, dizziness, or other concerns. She denies high salt diet today. Dr. Acie Fredrickson, who is in the office, advised that in addition to Losartan 25 mg once daily he wants her to start Kdur 10 meq and HCTZ 25 mg once daily. I advised her to relax at home and to get the medications as soon as possible. She states she will send her husband to the pharmacy to pick them up. I advised her that if BP does not decrease or if she develops symptoms of h/a, visual changes, or other concerns, to call 911. I advised her to call back with additional questions or concerns and she thanked me for my help.

## 2017-05-11 NOTE — Telephone Encounter (Signed)
Left message for patient that I was calling to see how she is feeling today. I advised that our office is closing for a staff meeting but she can call the office and reach the on-call provider if needed.

## 2017-05-21 NOTE — Telephone Encounter (Signed)
Called patient to find out how she is feeling since the recent bout of elevated BP. She states she is feeling better, more like herself. She asked if she is to continue taking the diuretic and potassium. I advised her to continue all current medications and scheduled her for a lab appointment and nurse visit on 12/27. She states she recently had lab work at Dr. Virgil Benedict office and asked if this would suffice for what is needed by our office. I advised that her bmet was normal on 11/27 and advised that we need a repeat bmet after several weeks of the medication therapy. She verbalized understanding and agreement with plan of care and thanked me for the call.

## 2017-05-31 ENCOUNTER — Other Ambulatory Visit: Payer: Medicare Other | Admitting: *Deleted

## 2017-05-31 ENCOUNTER — Ambulatory Visit (INDEPENDENT_AMBULATORY_CARE_PROVIDER_SITE_OTHER): Payer: Medicare Other | Admitting: Nurse Practitioner

## 2017-05-31 VITALS — BP 118/76 | HR 56 | Ht 61.0 in | Wt 140.5 lb

## 2017-05-31 DIAGNOSIS — I10 Essential (primary) hypertension: Secondary | ICD-10-CM | POA: Diagnosis not present

## 2017-05-31 LAB — BASIC METABOLIC PANEL
BUN / CREAT RATIO: 16 (ref 12–28)
BUN: 14 mg/dL (ref 8–27)
CO2: 26 mmol/L (ref 20–29)
CREATININE: 0.87 mg/dL (ref 0.57–1.00)
Calcium: 8.9 mg/dL (ref 8.7–10.3)
Chloride: 97 mmol/L (ref 96–106)
GFR, EST AFRICAN AMERICAN: 78 mL/min/{1.73_m2} (ref >59)
GFR, EST NON AFRICAN AMERICAN: 68 mL/min/{1.73_m2} (ref >59)
Glucose: 89 mg/dL (ref 65–99)
POTASSIUM: 3.8 mmol/L (ref 3.5–5.2)
SODIUM: 137 mmol/L (ref 134–144)

## 2017-05-31 NOTE — Patient Instructions (Signed)
Medication Instructions:  Your physician recommends that you continue on your current medications as directed. Please refer to the Current Medication list given to you today.   Labwork: BMET today - will call with results   Testing/Procedures: None Ordered   Follow-Up: Keep your follow-up appointment with Dr. Acie Fredrickson in 3 months  If you need a refill on your cardiac medications before your next appointment, please call your pharmacy.   Thank you for choosing [facility]! Christen Bame, RN [phone]

## 2017-05-31 NOTE — Progress Notes (Signed)
1.) Reason for visit: BP check  2.) Name of MD requesting visit: Dr. Acie Fredrickson  3.) H&P: Patient seen in the office by Dr. Acie Fredrickson on 12/4 and BP was elevated. On 12/6, she called to report that her BP remained elevated. She was advised to start Losartan 25 mg once daily, HCTZ 25 mg once daily, and Kdur 10 meq once daily  4.) ROS related to problem: Patient presents ambulatory in [place] and reports that she is feeling well. BP today is normal and the readings that she brings with her are consistently WNL with systolic BP 862-824 mmHg and diastolic BP 17-53 mmHg with an occasional high or low reading. She denies complaints.  5.) Assessment and plan per MD: Continue current medications and keep appointment in 3 months with Dr. Acie Fredrickson. Patient had bmet today which will be reviewed by Dr. Acie Fredrickson

## 2017-06-08 ENCOUNTER — Telehealth: Payer: Self-pay | Admitting: *Deleted

## 2017-06-08 NOTE — Telephone Encounter (Addendum)
Subject returned call placed for visit [id number] in the Clear Research Study. The call took place on May 17, 2017 @0955 .  The subject was getting ready to enter a meeting and wanted to make sure she didn't miss our call.  No c/o, aes or saes to report.  Stated doing well and next clinic visit 07/26/17.

## 2017-06-29 DIAGNOSIS — I788 Other diseases of capillaries: Secondary | ICD-10-CM | POA: Diagnosis not present

## 2017-06-29 DIAGNOSIS — L659 Nonscarring hair loss, unspecified: Secondary | ICD-10-CM | POA: Diagnosis not present

## 2017-06-29 DIAGNOSIS — L820 Inflamed seborrheic keratosis: Secondary | ICD-10-CM | POA: Diagnosis not present

## 2017-06-29 DIAGNOSIS — L814 Other melanin hyperpigmentation: Secondary | ICD-10-CM | POA: Diagnosis not present

## 2017-06-29 DIAGNOSIS — L821 Other seborrheic keratosis: Secondary | ICD-10-CM | POA: Diagnosis not present

## 2017-06-29 DIAGNOSIS — L570 Actinic keratosis: Secondary | ICD-10-CM | POA: Diagnosis not present

## 2017-06-29 DIAGNOSIS — L720 Epidermal cyst: Secondary | ICD-10-CM | POA: Diagnosis not present

## 2017-07-26 ENCOUNTER — Encounter: Payer: Self-pay | Admitting: *Deleted

## 2017-07-26 DIAGNOSIS — Z006 Encounter for examination for normal comparison and control in clinical research program: Secondary | ICD-10-CM

## 2017-07-26 NOTE — Progress Notes (Signed)
Subject to research clinic for [id number] in the Clear research study.  No c/o, aes or saes to capture.  Subject 100% compliant with study drug and new drug dispensed. Next f/u call and clinic visit scheduled.  Subject re-consented to: [place] version 5.1 28Aug2018 Local version [id number]

## 2017-08-11 ENCOUNTER — Other Ambulatory Visit: Payer: Self-pay | Admitting: Cardiovascular Disease

## 2017-08-15 ENCOUNTER — Encounter: Payer: Self-pay | Admitting: Cardiovascular Disease

## 2017-08-15 ENCOUNTER — Ambulatory Visit (INDEPENDENT_AMBULATORY_CARE_PROVIDER_SITE_OTHER): Payer: Medicare Other | Admitting: Cardiovascular Disease

## 2017-08-15 VITALS — BP 126/62 | HR 50 | Ht 60.0 in | Wt 142.8 lb

## 2017-08-15 DIAGNOSIS — I10 Essential (primary) hypertension: Secondary | ICD-10-CM

## 2017-08-15 MED ORDER — POTASSIUM CHLORIDE ER 10 MEQ PO TBCR: 10.0000 meq | EXTENDED_RELEASE_TABLET | Freq: Every day | ORAL | 3 refills | Status: DC

## 2017-08-15 MED ORDER — HYDROCHLOROTHIAZIDE 25 MG PO TABS: 25.0000 mg | ORAL_TABLET | Freq: Every day | ORAL | 3 refills | Status: DC

## 2017-08-15 MED ORDER — METOPROLOL SUCCINATE ER 50 MG PO TB24: 50.0000 mg | ORAL_TABLET | Freq: Every day | ORAL | 3 refills | Status: DC

## 2017-08-15 MED ORDER — LOSARTAN POTASSIUM 25 MG PO TABS: 25.0000 mg | ORAL_TABLET | Freq: Every day | ORAL | 3 refills | Status: DC

## 2017-08-15 NOTE — Patient Instructions (Signed)
Medication Instructions:  Your physician recommends that you continue on your current medications as directed. Please refer to the Current Medication list given to you today.   Labwork: None Ordered   Testing/Procedures: None Ordered   Follow-Up: Your physician wants you to follow-up in: 6 months with Dr. Nahser.  You will receive a reminder letter in the mail two months in advance. If you don't receive a letter, please call our office to schedule the follow-up appointment.   If you need a refill on your cardiac medications before your next appointment, please call your pharmacy.   Thank you for choosing [facility]! Carmeron Heady, RN [phone]    

## 2017-08-15 NOTE — Progress Notes (Signed)
Cardiology Office Note   Date:  08/15/2017   ID:  Shannon Cummings, DOB 05-09-1946, MRN [id number]  PCP:  [name], MD  Cardiologist:   [name], MD   Chief Complaint  Patient presents with  . Hypertension  . Coronary Artery Disease  . Atrial Fibrillation   1. Coronary artery disease-status post stenting in the past. She status post stenting of her proximal LAD 08/14/2012 2. Intermittent atrial fibrillation 3.  hyperlipidemia-she has been generally intolerant to most statins   Pt is doing well. No cardiac complaints.   She complains of some tingling and numbness associated with the Crestor. She has discontinued the Crestor because of that reason. She has had reactions to most statins that we have tried.  October 08-2011  She has done well. She has not had any angina or eposides of atrial fib. She has retired and is traveling quite a bit. Her husband has also retired. She has not had any problems.  October 01, 2012:  She is having some bruising ( due to [place]). She is enjoying the cardiac rehab. She has noticed some low BP readings at the end of cardiac rehab. She does not feel bad. These low readings are typically asymptomatic.   No angina. No arrhythmias.   August 20, 2013:  Shannon Cummings is doing ok. She was admitted to the hospital with some CP recently. Rule out for MI.  She has been doing [place]. Has gained a bit of weight. She is not able to exercise because Of left ankle problems.    August 19, 2014:   Shannon Cummings is a 72 y.o. female who presents for follow up of her atrial fib. Has gained some weight.  She is in the study for lipid management Has had leg cramps since last fall. Off and on  Has had more paroxysmal atrial fib.  Lasts about a minute. Seem to be occuring more frequently.   Jan. 18, 2017:  Shannon Cummings presents for follow up of her CAD and atrial fib.  Doing well.   Has visited [place] recently .   No CP or dyspnea.    Tolerates the Rythmol.  Talked about her daughter who lives in [place].  Is not having any significant atrial fib issues.   Has been on lipid lowering study drug at [facility]  ( injectable cholesterol medication )   Jan.   25, 2018:  Doing well.   Feeling well Has had a stomach bug last week and now has a URI  - not the flu.   Has maintained NSR .  Is having hair loss.  Her research shows that amlodipine and atenolol can cause this   Aug. 23, 2018:  Shannon Cummings is seen today for eval of some elevated BP Has been having pain in her back, tightness in her chest  Went to ER.  Has had elevated BP for a week,   Went back to ER  Doubled her metoprolol XL .  Saw Richardson Dopp on Aug. 6. 2018 Was given Imdur - developed a headache that lasted 3 days  Lexiscan myoview was low risk.   Her back pain / chest tightness has resolved.   May 08, 2017: Shannon Cummings seen today for follow-up of her hypertension, coronary artery disease, paroxysmal atrial fibrillation. Is feeling well  Is taking the Toprol XL just once a day - not BID as listed in MAR .   August 15, 2017   Shannon Cummings is doing well BP is much better.  Is  exercising and watching her diet.   Is on a study drug for cholesterol   Past Medical History:  Diagnosis Date  . Anginal pain ([place])   . Arthritis   . CAD (coronary artery disease)    a. prior stenting history. b. NSTEMI s/p DES to prox LAD with EF 45% by cath 08/14/12. c. Mild trop elevation shortly after cath ?mild plaque embolization - patent stent on relook. // d. Myoview 8/18: EF 60, normal perfusion; Low Risk  . Fatty infiltration of liver   . GERD (gastroesophageal reflux disease)   . Hyperlipidemia   . Hypertension   . Myocardial infarction ([place]) 2014  . OSA on CPAP   . Osteoporosis   . PAF (paroxysmal atrial fibrillation) ([place])    On rythmol.  . Pulmonary nodule    a. 18mm subpleural nodular density CT 08/2012, instructed to f/u pulm MD.  . Tubular adenoma of colon  2007    Past Surgical History:  Procedure Laterality Date  . CORONARY ANGIOPLASTY WITH STENT PLACEMENT  08/14/2012   LAD  Dr [name]  . CORONARY STENT PLACEMENT  2000  . hair restoration  02/2017  . LEFT HEART CATHETERIZATION WITH CORONARY ANGIOGRAM N/A 08/14/2012   Procedure: LEFT HEART CATHETERIZATION WITH CORONARY ANGIOGRAM;  Surgeon: [name], MD;  Location[facility] C[facility] LAB;  Service: Cardiovascular;  Laterality: N/A;  . LEFT HEART CATHETERIZATION WITH CORONARY ANGIOGRAM N/A 08/19/2012   Procedure: LEFT HEART CATHETERIZATION WITH CORONARY ANGIOGRAM;  Surgeon: [name], MD;  Location: [facility] CATH LAB;  Service: Cardiovascular;  Laterality: N/A;  . ROTATOR CUFF REPAIR    . TONSILLECTOMY    . TOTAL ABDOMINAL HYSTERECTOMY       Current Outpatient Medications  Medication Sig Dispense Refill  . acetaminophen (TYLENOL) 325 MG tablet Take 650 mg by mouth daily as needed (pain).     . AMBULATORY NON FORMULARY MEDICATION Take 180 mg by mouth daily. Medication Name: bempedoic acid 180 mg vs a placebo.  CLEAR Research Study, drug provided    . apixaban (ELIQUIS) 5 MG TABS tablet Take 1 tablet (5 mg total) by mouth 2 (two) times daily. 180 tablet 3  . aspirin EC 81 MG tablet Take 81 mg by mouth at bedtime.    . cholecalciferol (VITAMIN D) 1000 UNITS tablet Take 1,000 Units by mouth daily.    . fluticasone (FLONASE) 50 MCG/ACT nasal spray Place 2 sprays into both nostrils daily. 16 g 6  . hydrochlorothiazide (HYDRODIURIL) 25 MG tablet Take 1 tablet (25 mg total) by mouth daily. 90 tablet 3  . losartan (COZAAR) 25 MG tablet Take 1 tablet (25 mg total) by mouth daily. 90 tablet 3  . metoprolol succinate (TOPROL-XL) 50 MG 24 hr tablet Take 1 tablet (50 mg total) by mouth daily. Take with or immediately following a meal. 90 tablet 3  . nitroGLYCERIN (NITROSTAT) 0.4 MG SL tablet Place 1 tablet (0.4 mg total) under the tongue every 5 (five) minutes as needed for chest pain (up to 3 doses). 25  tablet 6  . pantoprazole (PROTONIX) 40 MG tablet Take 1 tablet (40 mg total) by mouth daily. 30 tablet 6  . potassium chloride (K-DUR) 10 MEQ tablet Take 1 tablet (10 mEq total) by mouth daily. 90 tablet 3  . propafenone (RYTHMOL) 150 MG tablet TAKE 1 TABLET(150 MG) BY MOUTH TWICE DAILY 180 tablet 2  . temazepam (RESTORIL) 15 MG capsule Take 1 capsule (15 mg total) by mouth at bedtime as needed for sleep.  30 capsule 5   No current facility-administered medications for this visit.     Allergies:   No known allergies; Other; Statins; and Zetia [ezetimibe]    Social History:  The patient  reports that  has never smoked. she has never used smokeless tobacco. She reports that she drinks alcohol. She reports that she does not use drugs.   Family History:  The patient's family history includes Alzheimer's disease in her mother; Breast cancer in her maternal aunt; Heart disease in her father.    ROS: Noted in current history.  All other systems are negative.  Physical Exam: Blood pressure 126/62, pulse (!) 50, height 5' (1.524 m), weight 142 lb 12.8 oz (64.8 kg), SpO2 97 %.  GEN:  Well nourished, well developed in no acute distress HEENT: Normal NECK: No JVD; No carotid bruits LYMPHATICS: No lymphadenopathy CARDIAC: RR RESPIRATORY:  Clear to auscultation without rales, wheezing or rhonchi  ABDOMEN: Soft, non-tender, non-distended MUSCULOSKELETAL:  No edema; No deformity  SKIN: Warm and dry NEUROLOGIC:  Alert and oriented x 3   EKG:   .    Recent Labs: 05/02/2017: ALT 24; Hemoglobin 15.3; Platelets 207.0; TSH 4.42 05/31/2017: BUN 14; Creatinine, Ser 0.87; Potassium 3.8; Sodium 137    Lipid Panel    Component Value Date/Time   CHOL 256 (H) 06/23/2015 1153   TRIG 215 (H) 06/23/2015 1153   HDL 44 (L) 06/23/2015 1153   CHOLHDL 5.8 (H) 06/23/2015 1153   VLDL 43 (H) 06/23/2015 1153   LDLCALC 169 (H) 06/23/2015 1153   LDLDIRECT 174.0 12/31/2012 0754      Wt Readings from Last 3  Encounters:  08/15/17 142 lb 12.8 oz (64.8 kg)  07/26/17 138 lb (62.6 kg)  05/31/17 140 lb 8 oz (63.7 kg)      Other studies Reviewed: Additional studies/ records that were reviewed today include: . Review of the above records demonstrates:    ASSESSMENT AND PLAN:  1. Coronary artery disease- . No angina .  Doing well   2. Intermittent atrial fibrillation -     On rhythmol. Doing well . Maintaining NSR   3.  hyperlipidemia-   Is on a study drug at [facility]   4. Essential HTN:     - BP is much better       Current medicines are reviewed at length with the patient today.  The patient does not have concerns regarding medicines.  The following changes have been made:  no change  Labs/ tests ordered today include:   No orders of the defined types were placed in this encounter.   Signed, [name], MD  08/15/2017 10:17 AM    [facility] [address], [place], [place]  [address] Phone: [phone]; Fax: [phone]

## 2017-09-10 ENCOUNTER — Other Ambulatory Visit: Payer: Self-pay | Admitting: Internal Medicine

## 2017-09-16 ENCOUNTER — Other Ambulatory Visit: Payer: Self-pay | Admitting: Cardiovascular Disease

## 2017-09-17 NOTE — Telephone Encounter (Signed)
Pt is a 72 yr old female. Last seen 08/15/17 by Dr Acie Fredrickson. Wt 64.8Kg at that visit. SCr was 0.87 on 05/31/17. Will refill Eliquis 5mg  BID.

## 2017-11-16 ENCOUNTER — Telehealth: Payer: Self-pay | Admitting: *Deleted

## 2017-11-16 NOTE — Telephone Encounter (Signed)
Patient called for visit T9-M21 in the Clear Research study.

## 2017-11-30 ENCOUNTER — Other Ambulatory Visit: Payer: Self-pay | Admitting: Family Medicine

## 2018-01-15 DIAGNOSIS — H524 Presbyopia: Secondary | ICD-10-CM | POA: Diagnosis not present

## 2018-01-15 DIAGNOSIS — H2513 Age-related nuclear cataract, bilateral: Secondary | ICD-10-CM | POA: Diagnosis not present

## 2018-01-15 DIAGNOSIS — H04123 Dry eye syndrome of bilateral lacrimal glands: Secondary | ICD-10-CM | POA: Diagnosis not present

## 2018-01-24 VITALS — BP 136/79 | HR 50 | Wt 138.0 lb

## 2018-01-24 DIAGNOSIS — Z006 Encounter for examination for normal comparison and control in clinical research program: Secondary | ICD-10-CM

## 2018-01-24 NOTE — Progress Notes (Signed)
Shannon Cummings to research clinic for visit [phone] in the Clear study.  No complaints, adverse events, or serious adverse events to report.  Subject 90% compliant with medications and new medication dispensed.  Next appointment scheduled.  Subject re-consented Subject met inclusion and exclusion criteria.  The informed consent form, study requirements and expectations were reviewed with the subject and questions and concerns were addressed prior to the signing of the consent form.  The subject verbalized understanding of the trial requirements.  The subject agreed to participate in the Clear trial and signed the informed consent.  The informed consent was obtained prior to performance of any protocol-specific procedures for the subject.  A copy of the signed informed consent was given to the subject and a copy was placed in the subject's medical record.

## 2018-03-13 DIAGNOSIS — Z1231 Encounter for screening mammogram for malignant neoplasm of breast: Secondary | ICD-10-CM | POA: Diagnosis not present

## 2018-03-13 LAB — HM MAMMOGRAPHY: HM Mammogram: NORMAL (ref 0–4)

## 2018-03-19 ENCOUNTER — Telehealth: Payer: Self-pay | Admitting: Cardiovascular Disease

## 2018-03-19 NOTE — Telephone Encounter (Signed)
New message      [facility] Pre-operative Risk Assessment    Request for surgical clearance:  1. What type of surgery is being performed? Hair Transplant Surgery  2. When is this surgery scheduled?03/25/2018  3. What type of clearance is required (medical clearance vs. Pharmacy clearance to hold med vs. Both)? Pharmacy   4. Are there any medications that need to be held prior to surgery and how long?need to be off eliquis 3 days per Traci  5. Practice name and name of physician performing surgery? Dr. Shelda Pal   6. What is your office phone [phone]   7.   What is your office fax number [phone]  8.   Anesthesia type (None, local, MAC, general) ? Local    Shannon Cummings 03/19/2018, 1:17 PM  _________________________________________________________________   (provider comments below)

## 2018-03-19 NOTE — Telephone Encounter (Signed)
   Request received in the pre-op pool for pharmacy clearance to hold eliquis x3 days for planned hair transplant surgery.   I will route this request to pre-op pharmacy for further recommendations and remove from the pre-op pool.   Abigail Butts, PA-C 03/19/18

## 2018-03-19 NOTE — Telephone Encounter (Signed)
Pt takes Eliquis for afib with CHADS2VASc score of 4 (age, sex, HTN, CAD). Renal function is normal. Recommend holding Eliquis for 1-2 days prior to procedure. Eliquis will be cleared sufficiently in that time and she should not require a 3 day hold for hair transplant.

## 2018-03-20 NOTE — Telephone Encounter (Signed)
    Appreciate pharmacy recommendations. Medication clearance was forwarded to Dr. Cline Crock office.   Callback: - Please ensure Dr. Cline Crock office received medication clearance recommendations.   Abigail Butts, PA-C 03/20/18

## 2018-03-20 NOTE — Telephone Encounter (Signed)
Left message to confirm if clearance letter was recv'd.

## 2018-04-10 DIAGNOSIS — L820 Inflamed seborrheic keratosis: Secondary | ICD-10-CM | POA: Diagnosis not present

## 2018-04-10 DIAGNOSIS — L720 Epidermal cyst: Secondary | ICD-10-CM | POA: Diagnosis not present

## 2018-04-10 DIAGNOSIS — L661 Lichen planopilaris: Secondary | ICD-10-CM | POA: Diagnosis not present

## 2018-04-10 DIAGNOSIS — L718 Other rosacea: Secondary | ICD-10-CM | POA: Diagnosis not present

## 2018-04-10 DIAGNOSIS — I788 Other diseases of capillaries: Secondary | ICD-10-CM | POA: Diagnosis not present

## 2018-04-10 DIAGNOSIS — Z23 Encounter for immunization: Secondary | ICD-10-CM | POA: Diagnosis not present

## 2018-04-22 ENCOUNTER — Other Ambulatory Visit: Payer: Self-pay

## 2018-04-30 ENCOUNTER — Other Ambulatory Visit: Payer: Self-pay

## 2018-04-30 ENCOUNTER — Encounter: Payer: Self-pay | Admitting: Family Medicine

## 2018-04-30 ENCOUNTER — Ambulatory Visit (INDEPENDENT_AMBULATORY_CARE_PROVIDER_SITE_OTHER): Payer: Medicare Other | Admitting: Family Medicine

## 2018-04-30 VITALS — BP 121/81 | HR 52 | Temp 98.0°F | Resp 16 | Ht 60.0 in | Wt 139.1 lb

## 2018-04-30 DIAGNOSIS — M2292 Unspecified disorder of patella, left knee: Secondary | ICD-10-CM

## 2018-04-30 DIAGNOSIS — I480 Paroxysmal atrial fibrillation: Secondary | ICD-10-CM | POA: Diagnosis not present

## 2018-04-30 DIAGNOSIS — R200 Anesthesia of skin: Secondary | ICD-10-CM | POA: Diagnosis not present

## 2018-04-30 DIAGNOSIS — E785 Hyperlipidemia, unspecified: Secondary | ICD-10-CM

## 2018-04-30 DIAGNOSIS — I10 Essential (primary) hypertension: Secondary | ICD-10-CM

## 2018-04-30 DIAGNOSIS — G4700 Insomnia, unspecified: Secondary | ICD-10-CM | POA: Diagnosis not present

## 2018-04-30 DIAGNOSIS — R202 Paresthesia of skin: Secondary | ICD-10-CM | POA: Diagnosis not present

## 2018-04-30 LAB — CBC WITH DIFFERENTIAL/PLATELET
BASOS PCT: 0.5 % (ref 0.0–3.0)
Basophils Absolute: 0 10*3/uL (ref 0.0–0.1)
EOS PCT: 1.1 % (ref 0.0–5.0)
Eosinophils Absolute: 0.1 10*3/uL (ref 0.0–0.7)
HCT: 44.4 % (ref 36.0–46.0)
Hemoglobin: 15.1 g/dL — ABNORMAL HIGH (ref 12.0–15.0)
LYMPHS ABS: 1.9 10*3/uL (ref 0.7–4.0)
Lymphocytes Relative: 41 % (ref 12.0–46.0)
MCHC: 34 g/dL (ref 30.0–36.0)
MCV: 92.5 fl (ref 78.0–100.0)
MONO ABS: 0.4 10*3/uL (ref 0.1–1.0)
Monocytes Relative: 8.2 % (ref 3.0–12.0)
NEUTROS ABS: 2.3 10*3/uL (ref 1.4–7.7)
NEUTROS PCT: 49.2 % (ref 43.0–77.0)
Platelets: 193 10*3/uL (ref 150.0–400.0)
RBC: 4.8 Mil/uL (ref 3.87–5.11)
RDW: 13.1 % (ref 11.5–15.5)
WBC: 4.7 10*3/uL (ref 4.0–10.5)

## 2018-04-30 LAB — B12 AND FOLATE PANEL
Folate: 13.9 ng/mL (ref >5.9)
VITAMIN B 12: 224 pg/mL (ref 211–911)

## 2018-04-30 LAB — TSH: TSH: 3.49 u[IU]/mL (ref 0.35–4.50)

## 2018-04-30 LAB — BASIC METABOLIC PANEL
BUN: 12 mg/dL (ref 6–23)
CO2: 29 mEq/L (ref 19–32)
Calcium: 8.8 mg/dL (ref 8.4–10.5)
Chloride: 98 mEq/L (ref 96–112)
Creatinine, Ser: 0.9 mg/dL (ref 0.40–1.20)
GFR: 65.45 mL/min (ref >60.00)
GLUCOSE: 96 mg/dL (ref 70–99)
POTASSIUM: 3.9 meq/L (ref 3.5–5.1)
Sodium: 135 mEq/L (ref 135–145)

## 2018-04-30 LAB — HEPATIC FUNCTION PANEL
ALK PHOS: 52 U/L (ref 39–117)
ALT: 26 U/L (ref 0–35)
AST: 26 U/L (ref 0–37)
Albumin: 4.3 g/dL (ref 3.5–5.2)
BILIRUBIN TOTAL: 0.6 mg/dL (ref 0.2–1.2)
Bilirubin, Direct: 0.1 mg/dL (ref 0.0–0.3)
Total Protein: 6.9 g/dL (ref 6.0–8.3)

## 2018-04-30 MED ORDER — VENLAFAXINE HCL ER 37.5 MG PO CP24: 37.5000 mg | ORAL_CAPSULE | Freq: Every day | ORAL | 3 refills | Status: DC

## 2018-04-30 NOTE — Assessment & Plan Note (Signed)
Chronic problem.  Well controlled today.  Asymptomatic.  Check labs.  No anticipated med changes.  Will follow. 

## 2018-04-30 NOTE — Assessment & Plan Note (Signed)
Ongoing issue for pt.  Not able to check labs b/c she is in a study at [facility]

## 2018-04-30 NOTE — Assessment & Plan Note (Signed)
Chronic problem, following w/ cards.  Currently asymptomatic.

## 2018-04-30 NOTE — Patient Instructions (Signed)
Follow up in 6 months to recheck BP We'll notify you of your lab results and make any changes if needed Continue to work on healthy diet and regular exercise- you look great! We'll call you with your Ortho appt for the kneecap START the Venlafaxine once daily Call with any questions or concerns Happy Holidays!!

## 2018-04-30 NOTE — Assessment & Plan Note (Signed)
Pt is not currently taking her Temazepam (she is out).  She would like to start Venlafaxine to improve hot flashes and help w/ sleep.  Will start medication and follow closely.

## 2018-04-30 NOTE — Progress Notes (Signed)
   Subjective:    Patient ID: Shannon Cummings, female    DOB: [birth year], 72 y.o.   MRN: [id number]  HPI HTN- chronic problem, on Metoprolol, Losartan, HCTZ w/ good control.  No CP, SOB, HAs, visual changes, edema.  Hyperlipidemia- pt has been intolerant to both statins and Zetia.  On a study drug at [facility].  Not able to check cholesterol today due to ongoing study.  Afib- chronic problem, on ASA and Eliquis.  On Metoprolol and Propafenone.  Following w/ Dr Acie Fredrickson  Numbness- pt reports numbness of hands and feet.  'like when you stay out in the snow too long'.  Feet tend to go numb when working in the yard, hands tend to go numb more often- particularly at night.  Insomnia- ongoing issue for pt.  Is out of Restoril (prescribed by Dr Annamaria Boots).  Some nights will only sleep 3-4 hrs.  Friend was started on Venlafaxine and hot flashes and sleep have both improved.  Knot on L kneecap- not painful unless direct pressure such as kneeling.  First appeared 3-4 months ago.   Review of Systems For ROS see HPI     Objective:   Physical Exam  Constitutional: She is oriented to person, place, and time. She appears well-developed and well-nourished. No distress.  HENT:  Head: Normocephalic and atraumatic.  Eyes: Pupils are equal, round, and reactive to light. Conjunctivae and EOM are normal.  Neck: Normal range of motion. Neck supple. No thyromegaly present.  Cardiovascular: Normal rate, regular rhythm, normal heart sounds and intact distal pulses.  No murmur heard. Pulmonary/Chest: Effort normal and breath sounds normal. No respiratory distress.  Abdominal: Soft. She exhibits no distension. There is no tenderness.  Musculoskeletal: She exhibits deformity (firm area on L anterior patella). She exhibits no edema.  Lymphadenopathy:    She has no cervical adenopathy.  Neurological: She is alert and oriented to person, place, and time.  Skin: Skin is warm and dry.  Psychiatric: She has a normal mood and  affect. Her behavior is normal.          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  If no cause identified, we could pursue neuro referral if pt desires

## 2018-05-01 ENCOUNTER — Encounter: Payer: Self-pay | Admitting: General Practice

## 2018-05-07 ENCOUNTER — Telehealth: Payer: Self-pay | Admitting: *Deleted

## 2018-05-07 NOTE — Telephone Encounter (Signed)
Phone follow up for the Clear research trial T11-M27

## 2018-05-08 ENCOUNTER — Ambulatory Visit (INDEPENDENT_AMBULATORY_CARE_PROVIDER_SITE_OTHER): Payer: Medicare Other | Admitting: Orthopedic Surgery

## 2018-05-08 ENCOUNTER — Encounter (INDEPENDENT_AMBULATORY_CARE_PROVIDER_SITE_OTHER): Payer: Self-pay | Admitting: Orthopedic Surgery

## 2018-05-08 ENCOUNTER — Ambulatory Visit (INDEPENDENT_AMBULATORY_CARE_PROVIDER_SITE_OTHER): Payer: Medicare Other

## 2018-05-08 DIAGNOSIS — M25562 Pain in left knee: Secondary | ICD-10-CM | POA: Diagnosis not present

## 2018-05-08 DIAGNOSIS — M7042 Prepatellar bursitis, left knee: Secondary | ICD-10-CM

## 2018-05-08 NOTE — Progress Notes (Signed)
Office Visit Note   Patient: Shannon Cummings           Date of Birth: [birth year]           MRN: [id number] Visit Date: 05/08/2018 Requested by: [name], MD [address] [place], [place] [address] PCP: [name], MD  Subjective: Chief Complaint  Patient presents with  . Left Knee - Pain    HPI: Patient presents with left knee pain.  She states when she is doing yoga she has some pain at the anterior aspect of the patella.  Denies any history of trauma or injury.  Is not debilitating pain but it is aggravating when she spends time on her knees on the mat.  No prior history of injury or surgery to that left knee.              ROS: All systems reviewed are negative as they relate to the chief complaint within the history of present illness.  Patient denies  fevers or chills.   Assessment & Plan: Visit Diagnoses:  1. Acute pain of left knee   2. Prepatellar bursitis of left knee     Plan: Impression is left knee focal prepatellar bursitis with some focal swelling in that area which is likely symptomatic direct pressure to the knee even when she is on a mat.  Plan is observation.  I do not think an injection is necessarily indicated at this time.  No evidence of infected prepatellar bursitis.  I think she just has some cordlike tissue measuring about 1 x 1 cm which is aggravating when she is trying to do different yoga positions on the mat.  If this were to be surgically excised she would be out of yoga for about a month.  I do not think it is at that point yet.  I did give her some topical anti-inflammatories to try.  Follow-up with me as needed  Follow-Up Instructions: Return if symptoms worsen or fail to improve.   Orders:  Orders Placed This Encounter  Procedures  . XR KNEE 3 VIEW LEFT   No orders of the defined types were placed in this encounter.     Procedures: No procedures performed   Clinical Data: No additional  findings.  Objective: Vital Signs: There were no vitals taken for this visit.  Physical Exam:   Constitutional: Patient appears well-developed HEENT:  Head: Normocephalic Eyes:EOM are normal Neck: Normal range of motion Cardiovascular: Normal rate Pulmonary/chest: Effort normal Neurologic: Patient is alert Skin: Skin is warm Psychiatric: Patient has normal mood and affect    Ortho Exam: Ortho exam demonstrates full active and passive range of motion of the left knee with no effusion.  Extensor mechanism is intact.  Pedal pulses palpable.  No masses lymph adenopathy or skin changes noted in that left knee region.  Collaterals are stable.  She does have a little 1 x 1 cm cordlike area of tender bursitis-like tissue just beneath the skin on top of the patella.  No prepatellar bursa effusion erythema or induration is present.  Ultrasound examination of this area demonstrates what appears to be slightly cystic tissue which is poorly defined.  Does not look like a lipoma but it does look like bursitis type tissue.  Specialty Comments:  No specialty comments available.  Imaging: Xr Knee 3 View Left  Result Date: 05/08/2018 AP lateral merchant left knee reviewed.  No arthritis is present.  Slight soft tissue bulge noted at  the anterior aspect distal aspect of the patella.  No evidence of fracture or traumatic injury.    PMFS History: Patient Active Problem List   Diagnosis Date Noted  . GERD (gastroesophageal reflux disease) 07/10/2016  . History of colonic polyps 02/25/2016  . Antiplatelet or antithrombotic long-term use 02/25/2016  . Nodule of right lung 10/13/2012  . History of non-ST elevation myocardial infarction (NSTEMI) 08/14/2012  . Hepatic steatosis 12/03/2011  . Paroxysmal atrial fibrillation ([place]) 12/22/2010  . CAD (coronary artery disease) 12/22/2010  . INSOMNIA 04/13/2010  . Hyperlipidemia 03/19/2010  . Essential hypertension 03/19/2010  . Obstructive sleep apnea  03/17/2010   Past Medical History:  Diagnosis Date  . Anginal pain ([place])   . Arthritis   . CAD (coronary artery disease)    a. prior stenting history. b. NSTEMI s/p DES to prox LAD with EF 45% by cath 08/14/12. c. Mild trop elevation shortly after cath ?mild plaque embolization - patent stent on relook. // d. Myoview 8/18: EF 60, normal perfusion; Low Risk  . Fatty infiltration of liver   . GERD (gastroesophageal reflux disease)   . Hyperlipidemia   . Hypertension   . Myocardial infarction ([place]) 2014  . OSA on CPAP   . Osteoporosis   . PAF (paroxysmal atrial fibrillation) ([place])    On rythmol.  . Pulmonary nodule    a. 21mm subpleural nodular density CT 08/2012, instructed to f/u pulm MD.  . Tubular adenoma of colon 2007    Family History  Problem Relation Age of Onset  . Heart disease Father   . Alzheimer's disease Mother   . Breast cancer Maternal Aunt   . Colon cancer Neg Hx     Past Surgical History:  Procedure Laterality Date  . CORONARY ANGIOPLASTY WITH STENT PLACEMENT  08/14/2012   LAD  Dr [name]  . CORONARY STENT PLACEMENT  2000  . hair restoration  02/2017  . LEFT HEART CATHETERIZATION WITH CORONARY ANGIOGRAM N/A 08/14/2012   Procedure: LEFT HEART CATHETERIZATION WITH CORONARY ANGIOGRAM;  Surgeon: [name], MD;  Location: [facility] CATH LAB;  Service: Cardiovascular;  Laterality: N/A;  . LEFT HEART CATHETERIZATION WITH CORONARY ANGIOGRAM N/A 08/19/2012   Procedure: LEFT HEART CATHETERIZATION WITH CORONARY ANGIOGRAM;  Surgeon: [name], MD;  Location: [facility] CATH LAB;  Service: Cardiovascular;  Laterality: N/A;  . ROTATOR CUFF REPAIR    . TONSILLECTOMY    . TOTAL ABDOMINAL HYSTERECTOMY     Social History   Occupational History  . 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 Smoking status: Never Smoker  . Smokeless tobacco: Never Used  Substance and Sexual Activity  . Alcohol use: Yes     Comment: 2 drinks daily   . Drug use: No  . Sexual activity: Not Currently

## 2018-05-10 ENCOUNTER — Other Ambulatory Visit: Payer: Self-pay | Admitting: Cardiovascular Disease

## 2018-05-22 ENCOUNTER — Other Ambulatory Visit: Payer: Self-pay | Admitting: Family Medicine

## 2018-06-13 ENCOUNTER — Encounter (HOSPITAL_COMMUNITY): Payer: Self-pay

## 2018-06-13 ENCOUNTER — Emergency Department (HOSPITAL_COMMUNITY): Payer: Medicare Other

## 2018-06-13 ENCOUNTER — Inpatient Hospital Stay (HOSPITAL_COMMUNITY)
Admission: EM | Admit: 2018-06-13 | Discharge: 2018-06-15 | DRG: 247 | Disposition: A | Discharge status: Home / Self Care | Payer: Medicare Other | Attending: Cardiovascular Disease | Admitting: Cardiovascular Disease

## 2018-06-13 ENCOUNTER — Other Ambulatory Visit: Payer: Self-pay

## 2018-06-13 DIAGNOSIS — I252 Old myocardial infarction: Secondary | ICD-10-CM

## 2018-06-13 DIAGNOSIS — Z955 Presence of coronary angioplasty implant and graft: Secondary | ICD-10-CM

## 2018-06-13 DIAGNOSIS — I10 Essential (primary) hypertension: Secondary | ICD-10-CM | POA: Diagnosis present

## 2018-06-13 DIAGNOSIS — I255 Ischemic cardiomyopathy: Secondary | ICD-10-CM | POA: Diagnosis not present

## 2018-06-13 DIAGNOSIS — E785 Hyperlipidemia, unspecified: Secondary | ICD-10-CM | POA: Diagnosis present

## 2018-06-13 DIAGNOSIS — G4733 Obstructive sleep apnea (adult) (pediatric): Secondary | ICD-10-CM | POA: Diagnosis not present

## 2018-06-13 DIAGNOSIS — Z888 Allergy status to other drugs, medicaments and biological substances status: Secondary | ICD-10-CM

## 2018-06-13 DIAGNOSIS — R7989 Other specified abnormal findings of blood chemistry: Secondary | ICD-10-CM | POA: Diagnosis not present

## 2018-06-13 DIAGNOSIS — R0789 Other chest pain: Secondary | ICD-10-CM | POA: Diagnosis not present

## 2018-06-13 DIAGNOSIS — E876 Hypokalemia: Secondary | ICD-10-CM | POA: Diagnosis not present

## 2018-06-13 DIAGNOSIS — I480 Paroxysmal atrial fibrillation: Secondary | ICD-10-CM | POA: Diagnosis present

## 2018-06-13 DIAGNOSIS — R001 Bradycardia, unspecified: Secondary | ICD-10-CM

## 2018-06-13 DIAGNOSIS — Z79899 Other long term (current) drug therapy: Secondary | ICD-10-CM

## 2018-06-13 DIAGNOSIS — K760 Fatty (change of) liver, not elsewhere classified: Secondary | ICD-10-CM | POA: Diagnosis present

## 2018-06-13 DIAGNOSIS — I214 Non-ST elevation (NSTEMI) myocardial infarction: Principal | ICD-10-CM | POA: Diagnosis present

## 2018-06-13 DIAGNOSIS — I4891 Unspecified atrial fibrillation: Secondary | ICD-10-CM | POA: Diagnosis not present

## 2018-06-13 DIAGNOSIS — R079 Chest pain, unspecified: Secondary | ICD-10-CM | POA: Diagnosis not present

## 2018-06-13 DIAGNOSIS — K219 Gastro-esophageal reflux disease without esophagitis: Secondary | ICD-10-CM | POA: Diagnosis present

## 2018-06-13 DIAGNOSIS — Z7901 Long term (current) use of anticoagulants: Secondary | ICD-10-CM

## 2018-06-13 DIAGNOSIS — I2510 Atherosclerotic heart disease of native coronary artery without angina pectoris: Secondary | ICD-10-CM | POA: Diagnosis present

## 2018-06-13 DIAGNOSIS — I959 Hypotension, unspecified: Secondary | ICD-10-CM | POA: Diagnosis present

## 2018-06-13 DIAGNOSIS — Z7982 Long term (current) use of aspirin: Secondary | ICD-10-CM

## 2018-06-13 DIAGNOSIS — R778 Other specified abnormalities of plasma proteins: Secondary | ICD-10-CM

## 2018-06-13 DIAGNOSIS — I25119 Atherosclerotic heart disease of native coronary artery with unspecified angina pectoris: Secondary | ICD-10-CM | POA: Diagnosis present

## 2018-06-13 DIAGNOSIS — M810 Age-related osteoporosis without current pathological fracture: Secondary | ICD-10-CM | POA: Diagnosis present

## 2018-06-13 DIAGNOSIS — R0602 Shortness of breath: Secondary | ICD-10-CM | POA: Diagnosis not present

## 2018-06-13 HISTORY — DX: Bradycardia, unspecified: R00.1

## 2018-06-13 HISTORY — DX: Hypotension, unspecified: I95.9

## 2018-06-13 HISTORY — DX: Hypokalemia: E87.6

## 2018-06-13 LAB — I-STAT TROPONIN, ED
Troponin i, poc: 0.08 ng/mL (ref 0.00–0.08)
Troponin i, poc: 0.22 ng/mL (ref 0.00–0.08)

## 2018-06-13 LAB — TROPONIN I: Troponin I: 0.08 ng/mL (ref <0.03)

## 2018-06-13 LAB — BASIC METABOLIC PANEL
ANION GAP: 11 (ref 5–15)
BUN: 11 mg/dL (ref 8–23)
CO2: 20 mmol/L — ABNORMAL LOW (ref 22–32)
Calcium: 7.6 mg/dL — ABNORMAL LOW (ref 8.9–10.3)
Chloride: 107 mmol/L (ref 98–111)
Creatinine, Ser: 0.86 mg/dL (ref 0.44–1.00)
GFR calc Af Amer: >60 mL/min (ref >60)
GLUCOSE: 99 mg/dL (ref 70–99)
Potassium: 2.9 mmol/L — ABNORMAL LOW (ref 3.5–5.1)
Sodium: 138 mmol/L (ref 135–145)

## 2018-06-13 LAB — CBC
HCT: 38.2 % (ref 36.0–46.0)
Hemoglobin: 12.7 g/dL (ref 12.0–15.0)
MCH: 30.1 pg (ref 26.0–34.0)
MCHC: 33.2 g/dL (ref 30.0–36.0)
MCV: 90.5 fL (ref 80.0–100.0)
Platelets: 179 10*3/uL (ref 150–400)
RBC: 4.22 MIL/uL (ref 3.87–5.11)
RDW: 12.8 % (ref 11.5–15.5)
WBC: 6.8 10*3/uL (ref 4.0–10.5)
nRBC: 0 % (ref 0.0–0.2)

## 2018-06-13 LAB — PROTIME-INR
INR: 1.13
Prothrombin Time: 14.4 seconds (ref 11.4–15.2)

## 2018-06-13 MED ORDER — LOSARTAN POTASSIUM 50 MG PO TABS: 25.0000 mg | ORAL_TABLET | Freq: Every day | ORAL | Status: DC

## 2018-06-13 MED ORDER — ONDANSETRON 4 MG PO TBDP: 4.0000 mg | ORAL_TABLET | Freq: Once | ORAL | Status: DC

## 2018-06-13 MED ORDER — TEMAZEPAM 15 MG PO CAPS: 15.0000 mg | ORAL_CAPSULE | Freq: Every evening | ORAL | Status: DC | PRN

## 2018-06-13 MED ORDER — ONDANSETRON HCL 4 MG/2ML IJ SOLN
4.0000 mg | Freq: Once | INTRAMUSCULAR | Status: AC
  Administered 2018-06-13: 4 mg via INTRAVENOUS
  Filled 2018-06-13: qty 2

## 2018-06-13 MED ORDER — PANTOPRAZOLE SODIUM 40 MG PO TBEC
40.0000 mg | DELAYED_RELEASE_TABLET | Freq: Every day | ORAL | Status: DC
  Administered 2018-06-14 – 2018-06-15 (×2): 40 mg via ORAL
  Filled 2018-06-13 (×2): qty 1

## 2018-06-13 MED ORDER — FLUTICASONE PROPIONATE 50 MCG/ACT NA SUSP: 2.0000 | Freq: Every day | NASAL | Status: DC | PRN

## 2018-06-13 MED ORDER — MORPHINE SULFATE (PF) 4 MG/ML IV SOLN
4.0000 mg | Freq: Once | INTRAVENOUS | Status: AC
  Administered 2018-06-13: 4 mg via INTRAVENOUS
  Filled 2018-06-13: qty 1

## 2018-06-13 MED ORDER — ONDANSETRON HCL 4 MG/2ML IJ SOLN: 4.0000 mg | Freq: Four times a day (QID) | INTRAMUSCULAR | Status: DC | PRN

## 2018-06-13 MED ORDER — ACETAMINOPHEN 325 MG PO TABS: 650.0000 mg | ORAL_TABLET | ORAL | Status: DC | PRN

## 2018-06-13 MED ORDER — VITAMIN B-12 1000 MCG PO TABS
1000.0000 ug | ORAL_TABLET | Freq: Two times a day (BID) | ORAL | Status: DC
  Administered 2018-06-14 – 2018-06-15 (×3): 1000 ug via ORAL
  Filled 2018-06-13 (×3): qty 1

## 2018-06-13 MED ORDER — VITAMIN D 25 MCG (1000 UNIT) PO TABS
1000.0000 [IU] | ORAL_TABLET | Freq: Every day | ORAL | Status: DC
  Administered 2018-06-14 – 2018-06-15 (×2): 1000 [IU] via ORAL
  Filled 2018-06-13 (×2): qty 1

## 2018-06-13 MED ORDER — ASPIRIN EC 81 MG PO TBEC
81.0000 mg | DELAYED_RELEASE_TABLET | Freq: Every day | ORAL | Status: DC
  Administered 2018-06-14: 81 mg via ORAL
  Filled 2018-06-13: qty 1

## 2018-06-13 MED ORDER — NITROGLYCERIN IN D5W 200-5 MCG/ML-% IV SOLN
0.0000 ug/min | INTRAVENOUS | Status: DC
  Administered 2018-06-13: 5 ug/min via INTRAVENOUS
  Filled 2018-06-13: qty 250

## 2018-06-13 MED ORDER — POTASSIUM CHLORIDE CRYS ER 20 MEQ PO TBCR
40.0000 meq | EXTENDED_RELEASE_TABLET | Freq: Once | ORAL | Status: AC
  Administered 2018-06-13: 40 meq via ORAL
  Filled 2018-06-13: qty 2

## 2018-06-13 MED ORDER — POTASSIUM CHLORIDE 10 MEQ/100ML IV SOLN
10.0000 meq | Freq: Once | INTRAVENOUS | Status: AC
  Administered 2018-06-13: 10 meq via INTRAVENOUS
  Filled 2018-06-13: qty 100

## 2018-06-13 MED ORDER — HEPARIN (PORCINE) 25000 UT/250ML-% IV SOLN
1050.0000 [IU]/h | INTRAVENOUS | Status: DC
  Administered 2018-06-13: 750 [IU]/h via INTRAVENOUS
  Filled 2018-06-13: qty 250

## 2018-06-13 MED ORDER — NITROGLYCERIN 0.4 MG SL SUBL: 0.4000 mg | SUBLINGUAL_TABLET | SUBLINGUAL | Status: DC | PRN

## 2018-06-13 NOTE — Progress Notes (Signed)
ANTICOAGULATION CONSULT NOTE - Initial Consult  Pharmacy Consult for heparin Indication: chest pain/ACS  Allergies  Allergen Reactions  . Other Anxiety and Other (See Comments)    Intolerance to strong pain medications=  Make her feel anxious and feel like coming out of her skin  . Effexor [Venlafaxine] Other (See Comments)    CAUSED TOO MUCH SEDATION; CANNOT TOLERATE  . Statins Other (See Comments)    Restless legs, bad feeling.  This has occurred with Crestor 5 mg once weekly, Lipitor 10 mg twice weekly, simvastatin daily  . Zetia [Ezetimibe]     Leg aches    Patient Measurements: Height: 5' (152.4 cm) Weight: 137 lb (62.1 kg) IBW/kg (Calculated) : 45.5 Heparin Dosing Weight: 62 kg  Vital Signs: Temp: 97.3 F (36.3 C) (01/09 1701) Temp Source: Oral (01/09 1701) BP: 148/92 (01/09 2045) Pulse Rate: 48 (01/09 2045)  Labs: Recent Labs    06/13/18 1708  HGB 12.7  HCT 38.2  PLT 179  LABPROT 14.4  INR 1.13  CREATININE 0.86  TROPONINI 0.08*    Estimated Creatinine Clearance: 49.3 mL/min (by C-G formula based on SCr of 0.86 mg/dL).   Medical History: Past Medical History:  Diagnosis Date  . Anginal pain ([place])   . Arthritis   . CAD (coronary artery disease)    a. prior stenting history. b. NSTEMI s/p DES to prox LAD with EF 45% by cath 08/14/12. c. Mild trop elevation shortly after cath ?mild plaque embolization - patent stent on relook. // d. Myoview 8/18: EF 60, normal perfusion; Low Risk  . Fatty infiltration of liver   . GERD (gastroesophageal reflux disease)   . Hyperlipidemia   . Hypertension   . Myocardial infarction ([place]) 2014  . OSA on CPAP   . Osteoporosis   . PAF (paroxysmal atrial fibrillation) ([place])    On rythmol.  . Pulmonary nodule    a. 79mm subpleural nodular density CT 08/2012, instructed to f/u pulm MD.  . Tubular adenoma of colon 2007    Medications:  (Not in a hospital admission)   Assessment: 45 YOF with h/o Afib on Eliquis at home  here with chest pain with elevated troponins. Pharmacy consulted to start IV heparin for ACS. Last dose of apixaban was at 1000 today. H/H and Plt wnl.   Goal of Therapy:  Heparin level 0.3-0.7 units/ml Monitor platelets by anticoagulation protocol: Yes   Plan:  -Start heparin infusion at 750 units/hr. No bolus -F/u 8 hr aPTT -Monitor daily aPTT/HL, CBC and s/s of bleeding -F/u cardiology plans   Albertina Parr, PharmD., BCPS Clinical Pharmacist Clinical phone for 06/13/18 until 11pm: [phone] If after 11pm, please refer to [facility] for unit-specific pharmacist

## 2018-06-13 NOTE — ED Provider Notes (Signed)
[facility] EMERGENCY DEPARTMENT Provider Note   CSN: [id number] Arrival date & time: 06/13/18  1656   History   Chief Complaint Chief Complaint  Patient presents with  . Chest Pain    HPI Shannon Cummings is a 73 y.o. female with a PMH of CAD, paroxysmal atrial fibrillation on Eliquis, HLD, HTN, and MI with stents (2000, 2014) presenting with constant left sided sharp chest pain radiating to her back onset at 3:30pm while riding in a vehicle from a funeral. Patient states nitroglycerin alleviated the pain. Patient states nothing makes the pain worse. Patient reports her symptoms are exactly the same as when she had her previous heart attacks. Patient denies leg pain/edema, recent travel, recent surgery, or hx of DVT/PE. Patient reports associated shortness of breath and nausea. Patient denies abdominal pain, vomiting, cough, or fever. Patient reports her cardiologist is Dr. Acie Fredrickson. Last stress test was done in 01/2017 and results were normal. Last cath was in 2014. Last echo was in 2007.  HPI  Past Medical History:  Diagnosis Date  . Anginal pain ([place])   . Arthritis   . CAD (coronary artery disease)    a. prior stenting history. b. NSTEMI s/p DES to prox LAD with EF 45% by cath 08/14/12. c. Mild trop elevation shortly after cath ?mild plaque embolization - patent stent on relook. // d. Myoview 8/18: EF 60, normal perfusion; Low Risk  . Fatty infiltration of liver   . GERD (gastroesophageal reflux disease)   . Hyperlipidemia   . Hypertension   . Myocardial infarction ([place]) 2014  . OSA on CPAP   . Osteoporosis   . PAF (paroxysmal atrial fibrillation) ([place])    On rythmol.  . Pulmonary nodule    a. 38mm subpleural nodular density CT 08/2012, instructed to f/u pulm MD.  . Tubular adenoma of colon 2007    Patient Active Problem List   Diagnosis Date Noted  . GERD (gastroesophageal reflux disease) 07/10/2016  . History of colonic polyps 02/25/2016  . Antiplatelet or  antithrombotic long-term use 02/25/2016  . Nodule of right lung 10/13/2012  . History of non-ST elevation myocardial infarction (NSTEMI) 08/14/2012  . Hepatic steatosis 12/03/2011  . Paroxysmal atrial fibrillation ([place]) 12/22/2010  . CAD (coronary artery disease) 12/22/2010  . INSOMNIA 04/13/2010  . Hyperlipidemia 03/19/2010  . Essential hypertension 03/19/2010  . Obstructive sleep apnea 03/17/2010    Past Surgical History:  Procedure Laterality Date  . CORONARY ANGIOPLASTY WITH STENT PLACEMENT  08/14/2012   LAD  Dr [name]  . CORONARY STENT PLACEMENT  2000  . hair restoration  02/2017  . LEFT HEART CATHETERIZATION WITH CORONARY ANGIOGRAM N/A 08/14/2012   Procedure: LEFT HEART CATHETERIZATION WITH CORONARY ANGIOGRAM;  Surgeon: [name], MD;  Location[facility] C[facility] LAB;  Service: Cardiovascular;  Laterality: N/A;  . LEFT HEART CATHETERIZATION WITH CORONARY ANGIOGRAM N/A 08/19/2012   Procedure: LEFT HEART CATHETERIZATION WITH CORONARY ANGIOGRAM;  Surgeon: [name], MD;  Location: [facility] CATH LAB;  Service: Cardiovascular;  Laterality: N/A;  . ROTATOR CUFF REPAIR    . TONSILLECTOMY    . TOTAL ABDOMINAL HYSTERECTOMY       OB History   No obstetric history on file.      Home Medications    Prior to Admission medications   Medication Sig Start Date End Date Taking? Authorizing Provider  AMBULATORY NON FORMULARY MEDICATION Take 180 mg by mouth daily. Medication Name: bempedoic acid 180 mg vs a placebo.  CLEAR Research Study, drug  provided 12/21/15  Yes Lelon Perla, MD  aspirin EC 81 MG tablet Take 81 mg by mouth at bedtime.   Yes [provider]  Cholecalciferol (VITAMIN D3) 25 MCG (1000 UT) CAPS Take 1,000 Units by mouth daily.   Yes [provider]  Cyanocobalamin (B-12) 1000 MCG LOZG Take 1,000 mcg by mouth 2 (two) times daily.    Yes [provider]  ELIQUIS 5 MG TABS tablet TAKE 1 TABLET(5 MG) BY MOUTH TWICE DAILY Patient taking  differently: Take 5 mg by mouth 2 (two) times daily.  09/17/17  Yes Nahser, Wonda Cheng, MD  fluticasone (FLONASE) 50 MCG/ACT nasal spray Place 2 sprays into both nostrils daily. Patient taking differently: Place 2 sprays into both nostrils daily as needed for allergies or rhinitis.  09/12/16  Yes [name], MD  hydrochlorothiazide (HYDRODIURIL) 25 MG tablet Take 1 tablet (25 mg total) by mouth daily. 08/15/17  Yes Nahser, Wonda Cheng, MD  losartan (COZAAR) 25 MG tablet Take 1 tablet (25 mg total) by mouth daily. Patient taking differently: Take 25 mg by mouth at bedtime.  08/15/17  Yes Nahser, Wonda Cheng, MD  metoprolol succinate (TOPROL-XL) 50 MG 24 hr tablet Take 1 tablet (50 mg total) by mouth daily. Take with or immediately following a meal. Patient taking differently: Take 50 mg by mouth at bedtime. Take with or immediately following a meal. 08/15/17  Yes Nahser, Wonda Cheng, MD  nitroGLYCERIN (NITROSTAT) 0.4 MG SL tablet Place 1 tablet (0.4 mg total) under the tongue every 5 (five) minutes as needed for chest pain (up to 3 doses). Patient taking differently: Place 0.4 mg under the tongue every 5 (five) minutes x 3 doses as needed for chest pain.  06/29/16  Yes Nahser, Wonda Cheng, MD  pantoprazole (PROTONIX) 40 MG tablet TAKE 1 TABLET(40 MG) BY MOUTH DAILY Patient taking differently: Take 40 mg by mouth daily.  05/22/18  Yes [name], MD  potassium chloride (K-DUR) 10 MEQ tablet Take 1 tablet (10 mEq total) by mouth daily. 08/15/17  Yes Nahser, Wonda Cheng, MD  propafenone (RYTHMOL) 150 MG tablet Take 1 tablet (150 mg total) by mouth 2 (two) times daily. Please call and schedule an appointment for further refills 1st attempt Patient taking differently: Take 150 mg by mouth every 12 (twelve) hours.  05/10/18  Yes Nahser, Wonda Cheng, MD  temazepam (RESTORIL) 15 MG capsule Take 15 mg by mouth at bedtime as needed for sleep.   Yes [provider]  acetaminophen (TYLENOL) 325 MG tablet Take  325-650 mg by mouth every 8 (eight) hours as needed (for pain).     [provider]  venlafaxine XR (EFFEXOR-XR) 37.5 MG 24 hr capsule Take 1 capsule (37.5 mg total) by mouth daily with breakfast. Patient not taking: Reported on 06/13/2018 04/30/18   [name], MD    Family History Family History  Problem Relation Age of Onset  . Heart disease Father   . Alzheimer's disease Mother   . Breast cancer Maternal Aunt   . Colon cancer Neg Hx     Social History Social History   Tobacco Use  . Smoking status: Never Smoker  . Smokeless tobacco: Never Used  Substance Use Topics  . Alcohol use: Yes    Comment: 2 drinks daily   . Drug use: No     Allergies   Other; Effexor [venlafaxine]; Statins; and Zetia [ezetimibe]   Review of Systems Review of Systems  Constitutional: Negative for  activity change, appetite change, chills, diaphoresis, fatigue, fever and unexpected weight change.  HENT: Negative for congestion and rhinorrhea.   Respiratory: Positive for shortness of breath. Negative for cough, chest tightness and wheezing.   Cardiovascular: Positive for chest pain. Negative for palpitations and leg swelling.  Gastrointestinal: Positive for nausea. Negative for abdominal pain and vomiting.  Endocrine: Negative for cold intolerance and heat intolerance.  Musculoskeletal: Positive for back pain.  Skin: Negative for rash.  Allergic/Immunologic: Negative for immunocompromised state.  Neurological: Positive for light-headedness. Negative for dizziness, syncope and weakness.  Psychiatric/Behavioral: Negative for agitation and behavioral problems. The patient is not nervous/anxious.      Physical Exam Updated Vital Signs BP (!) 153/91   Pulse (!) 52   Temp (!) 97.3 F (36.3 C) (Oral)   Resp 15   Ht 5' (1.524 m)   Wt 62.1 kg   SpO2 99%   BMI 26.76 kg/m   Physical Exam Vitals signs and nursing note reviewed.  Constitutional:      General: She is not in  acute distress.    Appearance: She is well-developed. She is not diaphoretic.  HENT:     Head: Normocephalic and atraumatic.  Neck:     Musculoskeletal: Normal range of motion and neck supple.     Vascular: No JVD.  Cardiovascular:     Rate and Rhythm: Normal rate and regular rhythm.     Pulses: Normal pulses.          Radial pulses are 2+ on the right side and 2+ on the left side.       Dorsalis pedis pulses are 2+ on the right side and 2+ on the left side.     Heart sounds: Normal heart sounds. No murmur. No friction rub. No gallop.   Pulmonary:     Effort: Pulmonary effort is normal. No respiratory distress.     Breath sounds: Normal breath sounds. No wheezing or rales.  Chest:     Chest wall: No tenderness.  Abdominal:     Palpations: Abdomen is soft.     Tenderness: There is no abdominal tenderness.  Musculoskeletal: Normal range of motion.     Right lower leg: She exhibits no tenderness. No edema.     Left lower leg: She exhibits no tenderness. No edema.  Skin:    General: Skin is warm.     Capillary Refill: Capillary refill takes less than 2 seconds.     Coloration: Skin is not pale.     Findings: No rash.  Neurological:     Mental Status: She is alert and oriented to person, place, and time.     ED Treatments / Results  Labs (all labs ordered are listed, but only abnormal results are displayed) Labs Reviewed  BASIC METABOLIC PANEL - Abnormal; Notable for the following components:      Result Value   Potassium 2.9 (*)    CO2 20 (*)    Calcium 7.6 (*)    All other components within normal limits  TROPONIN I - Abnormal; Notable for the following components:   Troponin I 0.08 (*)    All other components within normal limits  I-STAT TROPONIN, ED - Abnormal; Notable for the following components:   Troponin i, poc 0.22 (*)    All other components within normal limits  CBC  PROTIME-INR  HEPARIN LEVEL (UNFRACTIONATED)  CBC  APTT  I-STAT TROPONIN, ED     EKG EKG Interpretation  Date/Time:  Thursday June 13 2018 19:47:40 EST Ventricular Rate:  52 PR Interval:    QRS Duration: 106 QT Interval:  469 QTC Calculation: 437 R Axis:   20 Text Interpretation:  Sinus rhythm similar to earlier today Confirmed by Aletta Edouard [id number]) on 06/13/2018 8:41:57 PM   Radiology Dg Chest 2 View  Result Date: 06/13/2018 CLINICAL DATA:  Chest pain, shortness of breath, and weakness since 15:15 today. Previous history of heart disease, stent placement, hypertension, pulmonary nodule, sleep apnea, nonsmoker. EXAM: CHEST - 2 VIEW COMPARISON:  01/09/2017 FINDINGS: Shallow inspiration with mild linear atelectasis in the left lung base. Normal heart size and pulmonary vascularity. No focal airspace disease or consolidation in the lungs. No blunting of costophrenic angles. No pneumothorax. Mediastinal contours appear intact. Coronary stent is present. IMPRESSION: Shallow inspiration with linear atelectasis in the left lung base. Electronically Signed   By: Lucienne Capers M.D.   On: 06/13/2018 19:49    Procedures Procedures (including critical care time)  Medications Ordered in ED Medications  ondansetron (ZOFRAN-ODT) disintegrating tablet 4 mg (4 mg Oral Not Given 06/13/18 2111)  heparin ADULT infusion 100 units/mL (25000 units/270mL sodium chloride 0.45%) (has no administration in time range)  potassium chloride 10 mEq in 100 mL IVPB (10 mEq Intravenous New Bag/Given 06/13/18 1914)  potassium chloride SA (K-DUR,KLOR-CON) CR tablet 40 mEq (40 mEq Oral Given 06/13/18 1913)  morphine 4 MG/ML injection 4 mg (4 mg Intravenous Given 06/13/18 2011)  ondansetron (ZOFRAN) injection 4 mg (4 mg Intravenous Given 06/13/18 2111)     Initial Impression / Assessment and Plan / ED Course  I have reviewed the triage vital signs and the nursing notes.  Pertinent labs & imaging results that were available during my care of the patient were reviewed by me and considered in my  medical decision making (see chart for details).  Clinical Course as of Jun 13 2208  Thu Jun 13, 2018  8546 Hypokalemia noted at 2.9. Will provide potassium.  Potassium(!): 2.9 [AH]  1954 Shallow inspiration with linear atelectasis in the left lung base.  DG Chest 2 View [AH]  2105 Troponin increased to 0.22.   I-stat troponin, ED(!!) [AH]  7216 14-year-old female with history of coronary disease and stents here with chest pain that felt similar to when she had a cardiac event in 2014.  Her initial troponin was 0.08 but is risen to 0.22   [MB]    Clinical Course User Index [AH] Arville Lime, PA-C [MB] Hayden Rasmussen, MD    Concern for cardiac etiology of Chest Pain due to risk factors, history, and elevated troponin. Patient has an elevated troponin at 0.22. Cardiology has been consulted and cardiology has agreed to admit patient. Provided pain control, zofran for nausea, and ordered heparin. Provided potassium due to hypokalemia. Pt has been re-evaluated prior to consult and VSS, NAD, heart RRR, pain 2/10, lungs CTAB. No acute abnormalities found on EKG at this time. This case was discussed with Dr. Melina Copa who has seen the patient and agrees with plan to admit.   CRITICAL CARE Performed by: Secor  Total critical care time: 37 minutes  Critical care time was exclusive of separately billable procedures and treating other patients.  Critical care was necessary to treat or prevent imminent or life-threatening deterioration.  Critical care was time spent personally by me on the following activities: development of treatment plan with patient and/or surrogate as well as nursing, discussions with consultants, evaluation of patient's response to treatment,  examination of patient, obtaining history from patient or surrogate, ordering and performing treatments and interventions, ordering and review of laboratory studies, ordering and review of radiographic studies, pulse oximetry  and re-evaluation of patient's condition.  Final Clinical Impressions(s) / ED Diagnoses   Final diagnoses:  Left-sided chest pain  Elevated troponin  Hypokalemia    ED Discharge Orders    None       Arville Lime, PA-C 06/13/18 2211    Hayden Rasmussen, MD 06/13/18 2321

## 2018-06-13 NOTE — ED Notes (Signed)
I-Stat Troponin results of 0.22 reported to Dr. Melina Copa.

## 2018-06-13 NOTE — ED Triage Notes (Addendum)
Pt riding in vehicle from funeral (denies distress/anxiety), sudden onset 10/10 CP sharp and pressure radiating to back. Hx MI x 2 (most recent 2014) - Pt states this pain similar, also afib Associated SOB. 12 lead EKG stable. 2 nitro, 6 morph, 324 aspirin en route Pt now states pain 2/10

## 2018-06-13 NOTE — ED Notes (Signed)
Pt c/o increased CP 5/10, pressure radiating to back with nausea.  PA made aware, morphine ordered, EKG recaputred

## 2018-06-13 NOTE — H&P (Signed)
Cardiology History & Physical    Patient ID: Shannon Cummings MRN: [id number], DOB: 21-Apr-1946 Date of Encounter: 06/13/2018, 10:45 PM Primary Physician: Shannon Minium, MD  Chief Complaint: Chest pain   HPI: Shannon Cummings is a 73 y.o. female with history of coronary artery disease status post MI with PCI to proximal LAD (2014), pretension, hyperlipidemia paroxysmal atrial fibrillation on propafenone and Eliquis, who presents with chest pain.  The patient was in her usual state of good health until this afternoon around 3, when she noticed the relatively abrupt onset of substernal chest pain described as heavy pressure.  This was exactly's the same sensation as with her prior MI.  She had accompanying nausea.  Over the next 30 minutes or so pain worsened in intensity and EMS was called; she received morphine, full dose aspirin, and 2 sublingual nitros in route with marked improvement but not complete resolution of her chest pain.  She was brought to the [facility] ED for further evaluation.  In the ED, ECG showed sinus bradycardia without any acute ST or T wave changes.  Initial troponin was 0.08 and delta troponin was 0.22.  The remainder of the patient's labs were within normal limits.  Her last dose of Eliquis was on the morning of 1/9.  Upon my interview, the patient reports very mild ongoing chest pressure , rated at 1-2 out of 10 in intensity.  This is much improved from previous.  Past Medical History:  Diagnosis Date  . Anginal pain ([place])   . Arthritis   . CAD (coronary artery disease)    a. prior stenting history. b. NSTEMI s/p DES to prox LAD with EF 45% by cath 08/14/12. c. Mild trop elevation shortly after cath ?mild plaque embolization - patent stent on relook. // d. Myoview 8/18: EF 60, normal perfusion; Low Risk  . Fatty infiltration of liver   . GERD (gastroesophageal reflux disease)   . Hyperlipidemia   . Hypertension   . Myocardial infarction ([place]) 2014  . OSA on CPAP     . Osteoporosis   . PAF (paroxysmal atrial fibrillation) ([place])    On rythmol.  . Pulmonary nodule    a. 54mm subpleural nodular density CT 08/2012, instructed to f/u pulm MD.  . Tubular adenoma of colon 2007     Surgical History:  Past Surgical History:  Procedure Laterality Date  . CORONARY ANGIOPLASTY WITH STENT PLACEMENT  08/14/2012   LAD  Dr [name]  . CORONARY STENT PLACEMENT  2000  . hair restoration  02/2017  . LEFT HEART CATHETERIZATION WITH CORONARY ANGIOGRAM N/A 08/14/2012   Procedure: LEFT HEART CATHETERIZATION WITH CORONARY ANGIOGRAM;  Surgeon: [name], MD;  Location: [facility] CATH LAB;  Service: Cardiovascular;  Laterality: N/A;  . LEFT HEART CATHETERIZATION WITH CORONARY ANGIOGRAM N/A 08/19/2012   Procedure: LEFT HEART CATHETERIZATION WITH CORONARY ANGIOGRAM;  Surgeon: [name], MD;  Location: [facility] CATH LAB;  Service: Cardiovascular;  Laterality: N/A;  . ROTATOR CUFF REPAIR    . TONSILLECTOMY    . TOTAL ABDOMINAL HYSTERECTOMY       Home Meds: Prior to Admission medications   Medication Sig Start Date End Date Taking? Authorizing Provider  AMBULATORY NON FORMULARY MEDICATION Take 180 mg by mouth daily. Medication Name: bempedoic acid 180 mg vs a placebo.  CLEAR Research Study, drug provided 12/21/15  Yes Crenshaw, Denice Bors, MD  aspirin EC 81 MG tablet Take 81 mg by mouth at bedtime.   Yes [provider]  Cholecalciferol (VITAMIN D3) 25 MCG (1000 UT) CAPS Take 1,000 Units by mouth daily.   Yes [provider]  Cyanocobalamin (B-12) 1000 MCG LOZG Take 1,000 mcg by mouth 2 (two) times daily.    Yes [provider]  ELIQUIS 5 MG TABS tablet TAKE 1 TABLET(5 MG) BY MOUTH TWICE DAILY Patient taking differently: Take 5 mg by mouth 2 (two) times daily.  09/17/17  Yes Nahser, Wonda Cheng, MD  fluticasone (FLONASE) 50 MCG/ACT nasal spray Place 2 sprays into both nostrils daily. Patient taking differently: Place 2 sprays into both nostrils daily as  needed for allergies or rhinitis.  09/12/16  Yes Shannon Minium, MD  hydrochlorothiazide (HYDRODIURIL) 25 MG tablet Take 1 tablet (25 mg total) by mouth daily. 08/15/17  Yes Nahser, Wonda Cheng, MD  losartan (COZAAR) 25 MG tablet Take 1 tablet (25 mg total) by mouth daily. Patient taking differently: Take 25 mg by mouth at bedtime.  08/15/17  Yes Nahser, Wonda Cheng, MD  metoprolol succinate (TOPROL-XL) 50 MG 24 hr tablet Take 1 tablet (50 mg total) by mouth daily. Take with or immediately following a meal. Patient taking differently: Take 50 mg by mouth at bedtime. Take with or immediately following a meal. 08/15/17  Yes Nahser, Wonda Cheng, MD  nitroGLYCERIN (NITROSTAT) 0.4 MG SL tablet Place 1 tablet (0.4 mg total) under the tongue every 5 (five) minutes as needed for chest pain (up to 3 doses). Patient taking differently: Place 0.4 mg under the tongue every 5 (five) minutes x 3 doses as needed for chest pain.  06/29/16  Yes Nahser, Wonda Cheng, MD  pantoprazole (PROTONIX) 40 MG tablet TAKE 1 TABLET(40 MG) BY MOUTH DAILY Patient taking differently: Take 40 mg by mouth daily.  05/22/18  Yes Shannon Minium, MD  potassium chloride (K-DUR) 10 MEQ tablet Take 1 tablet (10 mEq total) by mouth daily. 08/15/17  Yes Nahser, Wonda Cheng, MD  propafenone (RYTHMOL) 150 MG tablet Take 1 tablet (150 mg total) by mouth 2 (two) times daily. Please call and schedule an appointment for further refills 1st attempt Patient taking differently: Take 150 mg by mouth every 12 (twelve) hours.  05/10/18  Yes Nahser, Wonda Cheng, MD  temazepam (RESTORIL) 15 MG capsule Take 15 mg by mouth at bedtime as needed for sleep.   Yes [provider]  acetaminophen (TYLENOL) 325 MG tablet Take 325-650 mg by mouth every 8 (eight) hours as needed (for pain).     [provider]  venlafaxine XR (EFFEXOR-XR) 37.5 MG 24 hr capsule Take 1 capsule (37.5 mg total) by mouth daily with breakfast. Patient not taking: Reported on 06/13/2018  04/30/18   Shannon Minium, MD    Allergies:  Allergies  Allergen Reactions  . Other Anxiety and Other (See Comments)    Intolerance to strong pain medications=  Make her feel anxious and feel like coming out of her skin  . Effexor [Venlafaxine] Other (See Comments)    CAUSED TOO MUCH SEDATION; CANNOT TOLERATE  . Statins Other (See Comments)    Restless legs, bad feeling.  This has occurred with Crestor 5 mg once weekly, Lipitor 10 mg twice weekly, simvastatin daily  . Zetia [Ezetimibe]     Leg aches    Social History   Socioeconomic History  . Marital status: Married    Spouse name: Not on file  . Number of children: 2  . Years of education: Not on file  . Highest education level: Not on file  Occupational History  . 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 Financial resource strain: Not on file  . Food insecurity:    Worry: Not on file    Inability: Not on file  . Transportation needs:    Medical: Not on file    Non-medical: Not on file  Tobacco Use  . Smoking status: Never Smoker  . Smokeless tobacco: Never Used  Substance and Sexual Activity  . Alcohol use: Yes    Comment: 2 drinks daily   . Drug use: No  . Sexual activity: Not Currently  Lifestyle  . Physical activity:    Days per week: Not on file    Minutes per session: Not on file  . Stress: Not on file  Relationships  . Social connections:    Talks on phone: Not on file    Gets together: Not on file    Attends religious service: Not on file    Active member of club or organization: Not on file    Attends meetings of clubs or organizations: Not on file    Relationship status: Not on file  . Intimate partner violence:    Fear of current or ex partner: Not on file    Emotionally abused: Not on file    Physically abused: Not on file    Forced sexual activity: Not on file  Other Topics Concern  . Not on file  Social History  Narrative   Daily caffeine      Family History  Problem Relation Age of Onset  . Heart disease Father   . Alzheimer's disease Mother   . Breast cancer Maternal Aunt   . Colon cancer Neg Hx     Review of Systems: All other systems reviewed and are otherwise negative except as noted above.  Labs:   Lab Results  Component Value Date   WBC 6.8 06/13/2018   HGB 12.7 06/13/2018   HCT 38.2 06/13/2018   MCV 90.5 06/13/2018   PLT 179 06/13/2018    Recent Labs  Lab 06/13/18 1708  NA 138  K 2.9*  CL 107  CO2 20*  BUN 11  CREATININE 0.86  CALCIUM 7.6*  GLUCOSE 99   Recent Labs    06/13/18 1708  TROPONINI 0.08*   Lab Results  Component Value Date   CHOL 256 (H) 06/23/2015   HDL 44 (L) 06/23/2015   LDLCALC 169 (H) 06/23/2015   TRIG 215 (H) 06/23/2015   Lab Results  Component Value Date   DDIMER  10/17/2008    0.25        AT THE INHOUSE ESTABLISHED CUTOFF VALUE OF 0.48 ug/mL FEU, THIS ASSAY HAS BEEN DOCUMENTED IN THE LITERATURE TO HAVE A SENSITIVITY AND NEGATIVE PREDICTIVE VALUE OF AT LEAST 98 TO 99%.  THE TEST RESULT SHOULD BE CORRELATED WITH AN ASSESSMENT OF THE CLINICAL PROBABILITY OF DVT / VTE.    Radiology/Studies:  Dg Chest 2 View  Result Date: 06/13/2018 CLINICAL DATA:  Chest pain, shortness of breath, and weakness since 15:15 today. Previous history of heart disease, stent placement, hypertension, pulmonary nodule, sleep apnea, nonsmoker. EXAM: CHEST - 2 VIEW COMPARISON:  01/09/2017 FINDINGS: Shallow inspiration with mild linear atelectasis in the left lung base. Normal heart size and pulmonary vascularity. No focal airspace disease or consolidation in the lungs. No blunting of costophrenic angles. No pneumothorax. Mediastinal contours appear intact. Coronary stent is  present. IMPRESSION: Shallow inspiration with linear atelectasis in the left lung base. Electronically Signed   By: Lucienne Capers M.D.   On: 06/13/2018 19:49   Wt Readings from Last 3  Encounters:  06/13/18 62.1 kg  04/30/18 63.1 kg  01/24/18 62.6 kg    EKG: As bradycardia, poor R wave progression, no acute ST or T wave changes.  Physical Exam: Blood pressure (!) 143/96, pulse (!) 50, temperature (!) 97.3 F (36.3 C), temperature source Oral, resp. rate 13, height 5' (1.524 m), weight 62.1 kg, SpO2 96 %. Body mass index is 26.76 kg/m. General: Well developed, well nourished, in no acute distress. Head: Normocephalic, atraumatic, sclera non-icteric, no xanthomas, nares are without discharge.  Neck: Negative for carotid bruits. JVD not elevated. Lungs: Clear bilaterally to auscultation without wheezes, rales, or rhonchi. Breathing is unlabored. Heart: Bradycardic, regular with S1 S2. No murmurs, rubs, or gallops appreciated. Abdomen: Soft, non-tender, non-distended with normoactive bowel sounds. No hepatomegaly. No rebound/guarding. No obvious abdominal masses. Msk:  Strength and tone appear normal for age. Extremities: No clubbing or cyanosis. No edema.  Distal pedal pulses are 2+ and equal bilaterally. Neuro: Alert and oriented X 3. No focal deficit. No facial asymmetry. Moves all extremities spontaneously. Psych:  Responds to questions appropriately with a normal affect.    Assessment and Plan  73 year old lady with known coronary disease and atrial fibrillation presents with NSTEMI.  1.  NSTEMI: Very mild chest pain at present, will start IV heparin drip and IV nitroglycerin drip.  Plan for cardiac catheterization tomorrow morning, or sooner if she were to have refractory chest pain.  Echocardiogram ordered.  Beta-blockade on hold for now due to relative bradycardia.    2.  Hypertension: Continue home ARB.  Holding hydrochlorothiazide in anticipation of cath.  Holding beta-blockade as above.  3.  Hyperlipidemia: Not on statins due to side effects.  Given her current events, would consider PCSK9 inhibitor.  4.  Atrial fibrillation: Last dose of Eliquis on the  morning of 1/9.  Holding Eliquis for now.  Given coronary disease, would consider stopping propafenone as a rhythm control agent- have not reordered at present.  Signed, Doylene Canning, MD 06/13/2018, 10:45 PM

## 2018-06-14 ENCOUNTER — Observation Stay (HOSPITAL_BASED_OUTPATIENT_CLINIC_OR_DEPARTMENT_OTHER): Payer: Medicare Other

## 2018-06-14 ENCOUNTER — Encounter (HOSPITAL_COMMUNITY)
Admission: EM | Disposition: A | Discharge status: Home / Self Care | Payer: Self-pay | Source: Home / Self Care | Attending: Cardiovascular Disease

## 2018-06-14 DIAGNOSIS — Z955 Presence of coronary angioplasty implant and graft: Secondary | ICD-10-CM | POA: Diagnosis not present

## 2018-06-14 DIAGNOSIS — Z7901 Long term (current) use of anticoagulants: Secondary | ICD-10-CM | POA: Diagnosis not present

## 2018-06-14 DIAGNOSIS — I2510 Atherosclerotic heart disease of native coronary artery without angina pectoris: Secondary | ICD-10-CM

## 2018-06-14 DIAGNOSIS — E785 Hyperlipidemia, unspecified: Secondary | ICD-10-CM | POA: Diagnosis present

## 2018-06-14 DIAGNOSIS — G4733 Obstructive sleep apnea (adult) (pediatric): Secondary | ICD-10-CM | POA: Diagnosis present

## 2018-06-14 DIAGNOSIS — Z79899 Other long term (current) drug therapy: Secondary | ICD-10-CM | POA: Diagnosis not present

## 2018-06-14 DIAGNOSIS — K760 Fatty (change of) liver, not elsewhere classified: Secondary | ICD-10-CM | POA: Diagnosis present

## 2018-06-14 DIAGNOSIS — Z7982 Long term (current) use of aspirin: Secondary | ICD-10-CM | POA: Diagnosis not present

## 2018-06-14 DIAGNOSIS — Z888 Allergy status to other drugs, medicaments and biological substances status: Secondary | ICD-10-CM | POA: Diagnosis not present

## 2018-06-14 DIAGNOSIS — I10 Essential (primary) hypertension: Secondary | ICD-10-CM | POA: Diagnosis present

## 2018-06-14 DIAGNOSIS — I480 Paroxysmal atrial fibrillation: Secondary | ICD-10-CM | POA: Diagnosis present

## 2018-06-14 DIAGNOSIS — I255 Ischemic cardiomyopathy: Secondary | ICD-10-CM | POA: Diagnosis present

## 2018-06-14 DIAGNOSIS — I214 Non-ST elevation (NSTEMI) myocardial infarction: Secondary | ICD-10-CM

## 2018-06-14 DIAGNOSIS — I25119 Atherosclerotic heart disease of native coronary artery with unspecified angina pectoris: Secondary | ICD-10-CM | POA: Diagnosis present

## 2018-06-14 DIAGNOSIS — M810 Age-related osteoporosis without current pathological fracture: Secondary | ICD-10-CM | POA: Diagnosis present

## 2018-06-14 DIAGNOSIS — K219 Gastro-esophageal reflux disease without esophagitis: Secondary | ICD-10-CM | POA: Diagnosis present

## 2018-06-14 DIAGNOSIS — I252 Old myocardial infarction: Secondary | ICD-10-CM | POA: Diagnosis not present

## 2018-06-14 DIAGNOSIS — I959 Hypotension, unspecified: Secondary | ICD-10-CM | POA: Diagnosis present

## 2018-06-14 DIAGNOSIS — E876 Hypokalemia: Secondary | ICD-10-CM | POA: Diagnosis present

## 2018-06-14 HISTORY — PX: CORONARY STENT INTERVENTION: CATH118234

## 2018-06-14 HISTORY — PX: LEFT HEART CATH AND CORONARY ANGIOGRAPHY: CATH118249

## 2018-06-14 LAB — ECHOCARDIOGRAM COMPLETE
Height: 60 in
Weight: 2193.6 oz

## 2018-06-14 LAB — BASIC METABOLIC PANEL
Anion gap: 9 (ref 5–15)
BUN: 11 mg/dL (ref 8–23)
CO2: 28 mmol/L (ref 22–32)
Calcium: 8.2 mg/dL — ABNORMAL LOW (ref 8.9–10.3)
Chloride: 98 mmol/L (ref 98–111)
Creatinine, Ser: 0.9 mg/dL (ref 0.44–1.00)
GFR calc Af Amer: >60 mL/min (ref >60)
GFR calc non Af Amer: >60 mL/min (ref >60)
Glucose, Bld: 120 mg/dL — ABNORMAL HIGH (ref 70–99)
Potassium: 3.5 mmol/L (ref 3.5–5.1)
Sodium: 135 mmol/L (ref 135–145)

## 2018-06-14 LAB — CBC
HCT: 42.1 % (ref 36.0–46.0)
Hemoglobin: 14.4 g/dL (ref 12.0–15.0)
MCH: 31.2 pg (ref 26.0–34.0)
MCHC: 34.2 g/dL (ref 30.0–36.0)
MCV: 91.1 fL (ref 80.0–100.0)
Platelets: 183 10*3/uL (ref 150–400)
RBC: 4.62 MIL/uL (ref 3.87–5.11)
RDW: 12.9 % (ref 11.5–15.5)
WBC: 6.6 10*3/uL (ref 4.0–10.5)
nRBC: 0 % (ref 0.0–0.2)

## 2018-06-14 LAB — APTT
aPTT: 49 seconds — ABNORMAL HIGH (ref 24–36)
aPTT: 62 seconds — ABNORMAL HIGH (ref 24–36)

## 2018-06-14 LAB — HEPARIN LEVEL (UNFRACTIONATED): Heparin Unfractionated: 1.68 IU/mL — ABNORMAL HIGH (ref 0.30–0.70)

## 2018-06-14 LAB — POCT ACTIVATED CLOTTING TIME
Activated Clotting Time: 285 seconds
Activated Clotting Time: 362 seconds

## 2018-06-14 SURGERY — LEFT HEART CATH AND CORONARY ANGIOGRAPHY
Anesthesia: LOCAL

## 2018-06-14 MED ORDER — NITROGLYCERIN 1 MG/10 ML FOR IR/CATH LAB
INTRA_ARTERIAL | Status: DC | PRN
  Administered 2018-06-14: 200 ug via INTRACORONARY
  Administered 2018-06-14: 300 ug via INTRACORONARY
  Administered 2018-06-14: 200 ug via INTRACORONARY

## 2018-06-14 MED ORDER — FENTANYL CITRATE (PF) 100 MCG/2ML IJ SOLN
INTRAMUSCULAR | Status: AC
  Filled 2018-06-14: qty 2

## 2018-06-14 MED ORDER — VERAPAMIL HCL 2.5 MG/ML IV SOLN
INTRAVENOUS | Status: DC | PRN
  Administered 2018-06-14: 10 mL via INTRA_ARTERIAL

## 2018-06-14 MED ORDER — LABETALOL HCL 5 MG/ML IV SOLN: 10.0000 mg | INTRAVENOUS | Status: AC | PRN

## 2018-06-14 MED ORDER — ALTEPLASE 2 MG IJ SOLR: 2.0000 mg | Freq: Once | INTRAMUSCULAR | Status: DC

## 2018-06-14 MED ORDER — LIDOCAINE HCL (PF) 1 % IJ SOLN
INTRAMUSCULAR | Status: AC
  Filled 2018-06-14: qty 30

## 2018-06-14 MED ORDER — SODIUM CHLORIDE 0.9% FLUSH: 3.0000 mL | Freq: Two times a day (BID) | INTRAVENOUS | Status: DC

## 2018-06-14 MED ORDER — LIDOCAINE HCL (PF) 1 % IJ SOLN
INTRAMUSCULAR | Status: DC | PRN
  Administered 2018-06-14: 2 mL

## 2018-06-14 MED ORDER — MIDAZOLAM HCL 2 MG/2ML IJ SOLN
INTRAMUSCULAR | Status: AC
  Filled 2018-06-14: qty 2

## 2018-06-14 MED ORDER — SODIUM CHLORIDE 0.9 % IV SOLN: 250.0000 mL | INTRAVENOUS | Status: DC | PRN

## 2018-06-14 MED ORDER — IOHEXOL 350 MG/ML SOLN
INTRAVENOUS | Status: DC | PRN
  Administered 2018-06-14: 130 mL via INTRACARDIAC

## 2018-06-14 MED ORDER — ONDANSETRON HCL 4 MG/2ML IJ SOLN: 4.0000 mg | Freq: Four times a day (QID) | INTRAMUSCULAR | Status: DC | PRN

## 2018-06-14 MED ORDER — HEPARIN (PORCINE) IN NACL 1000-0.9 UT/500ML-% IV SOLN
INTRAVENOUS | Status: AC
  Filled 2018-06-14: qty 500

## 2018-06-14 MED ORDER — SODIUM CHLORIDE 0.9% FLUSH: 3.0000 mL | INTRAVENOUS | Status: DC | PRN

## 2018-06-14 MED ORDER — FENTANYL CITRATE (PF) 100 MCG/2ML IJ SOLN
INTRAMUSCULAR | Status: DC | PRN
  Administered 2018-06-14 (×2): 25 ug via INTRAVENOUS

## 2018-06-14 MED ORDER — HEPARIN SODIUM (PORCINE) 1000 UNIT/ML IJ SOLN
INTRAMUSCULAR | Status: DC | PRN
  Administered 2018-06-14: 5000 [IU] via INTRAVENOUS
  Administered 2018-06-14 (×2): 3000 [IU] via INTRAVENOUS

## 2018-06-14 MED ORDER — SODIUM CHLORIDE 0.9 % WEIGHT BASED INFUSION: 1.0000 mL/kg/h | INTRAVENOUS | Status: DC

## 2018-06-14 MED ORDER — PROMETHAZINE HCL 25 MG/ML IJ SOLN
12.5000 mg | Freq: Four times a day (QID) | INTRAMUSCULAR | Status: DC | PRN
  Administered 2018-06-14: 12.5 mg via INTRAVENOUS
  Filled 2018-06-14: qty 1

## 2018-06-14 MED ORDER — MIDAZOLAM HCL 2 MG/2ML IJ SOLN
INTRAMUSCULAR | Status: DC | PRN
  Administered 2018-06-14: 2 mg via INTRAVENOUS
  Administered 2018-06-14: 1 mg via INTRAVENOUS

## 2018-06-14 MED ORDER — TICAGRELOR 90 MG PO TABS
ORAL_TABLET | ORAL | Status: DC | PRN
  Administered 2018-06-14: 180 mg via ORAL

## 2018-06-14 MED ORDER — SODIUM CHLORIDE 0.9% FLUSH
3.0000 mL | Freq: Two times a day (BID) | INTRAVENOUS | Status: DC
  Administered 2018-06-14: 22:00:00 3 mL via INTRAVENOUS

## 2018-06-14 MED ORDER — MORPHINE SULFATE (PF) 2 MG/ML IV SOLN
INTRAVENOUS | Status: AC
  Administered 2018-06-14: 2 mg via INTRAVENOUS
  Filled 2018-06-14: qty 1

## 2018-06-14 MED ORDER — HEPARIN SODIUM (PORCINE) 1000 UNIT/ML IJ SOLN
INTRAMUSCULAR | Status: AC
  Filled 2018-06-14: qty 1

## 2018-06-14 MED ORDER — ANGIOPLASTY BOOK
Freq: Once | Status: AC
  Administered 2018-06-14: 22:00:00
  Filled 2018-06-14: qty 1

## 2018-06-14 MED ORDER — PROMETHAZINE HCL 25 MG/ML IJ SOLN: 12.5000 mg | Freq: Four times a day (QID) | INTRAMUSCULAR | Status: DC | PRN

## 2018-06-14 MED ORDER — TICAGRELOR 90 MG PO TABS
ORAL_TABLET | ORAL | Status: AC
  Filled 2018-06-14: qty 2

## 2018-06-14 MED ORDER — VERAPAMIL HCL 2.5 MG/ML IV SOLN
INTRAVENOUS | Status: AC
  Filled 2018-06-14: qty 2

## 2018-06-14 MED ORDER — CLOPIDOGREL BISULFATE 75 MG PO TABS
300.0000 mg | ORAL_TABLET | Freq: Once | ORAL | Status: AC
  Administered 2018-06-15: 300 mg via ORAL
  Filled 2018-06-14: qty 4

## 2018-06-14 MED ORDER — SODIUM CHLORIDE 0.9 % WEIGHT BASED INFUSION
3.0000 mL/kg/h | INTRAVENOUS | Status: DC
  Administered 2018-06-14: 3 mL/kg/h via INTRAVENOUS

## 2018-06-14 MED ORDER — HYDRALAZINE HCL 20 MG/ML IJ SOLN: 5.0000 mg | INTRAMUSCULAR | Status: AC | PRN

## 2018-06-14 MED ORDER — ASPIRIN 81 MG PO CHEW
81.0000 mg | CHEWABLE_TABLET | Freq: Every day | ORAL | Status: DC
  Administered 2018-06-15: 09:00:00 81 mg via ORAL
  Filled 2018-06-14: qty 1

## 2018-06-14 MED ORDER — NITROGLYCERIN 1 MG/10 ML FOR IR/CATH LAB
INTRA_ARTERIAL | Status: AC
  Filled 2018-06-14: qty 10

## 2018-06-14 MED ORDER — MORPHINE SULFATE (PF) 10 MG/ML IV SOLN: 2.0000 mg | Freq: Once | INTRAVENOUS | Status: DC

## 2018-06-14 MED ORDER — HEPARIN (PORCINE) IN NACL 1000-0.9 UT/500ML-% IV SOLN
INTRAVENOUS | Status: DC | PRN
  Administered 2018-06-14 (×2): 500 mL

## 2018-06-14 MED ORDER — ACETAMINOPHEN 325 MG PO TABS: 650.0000 mg | ORAL_TABLET | ORAL | Status: DC | PRN

## 2018-06-14 MED ORDER — HEART ATTACK BOUNCING BOOK
Freq: Once | Status: AC
  Administered 2018-06-14: 22:00:00
  Filled 2018-06-14: qty 1

## 2018-06-14 MED ORDER — SODIUM CHLORIDE 0.9 % IV SOLN: INTRAVENOUS | Status: AC

## 2018-06-14 MED ORDER — CLOPIDOGREL BISULFATE 75 MG PO TABS: 75.0000 mg | ORAL_TABLET | Freq: Every day | ORAL | Status: DC

## 2018-06-14 SURGICAL SUPPLY — 19 items
BALLN SAPPHIRE 2.0X12 (BALLOONS) ×2
BALLN SAPPHIRE ~~LOC~~ 2.75X8 (BALLOONS) ×1 IMPLANT
BALLOON SAPPHIRE 2.0X12 (BALLOONS) IMPLANT
CATH 5FR JL3.5 JR4 ANG PIG MP (CATHETERS) ×1 IMPLANT
CATH LAUNCHER 6FR EBU3.5 (CATHETERS) ×1 IMPLANT
DEVICE RAD COMP TR BAND LRG (VASCULAR PRODUCTS) ×1 IMPLANT
GLIDESHEATH SLEND SS 6F .021 (SHEATH) ×1 IMPLANT
GUIDEWIRE INQWIRE 1.5J.035X260 (WIRE) IMPLANT
INQWIRE 1.5J .035X260CM (WIRE) ×2
KIT ENCORE 26 ADVANTAGE (KITS) ×1 IMPLANT
KIT HEART LEFT (KITS) ×2 IMPLANT
KIT HEMO VALVE WATCHDOG (MISCELLANEOUS) ×1 IMPLANT
PACK CARDIAC CATHETERIZATION (CUSTOM PROCEDURE TRAY) ×2 IMPLANT
SHEATH PROBE COVER 6X72 (BAG) ×1 IMPLANT
STENT SYNERGY DES 2.5X12 (Permanent Stent) ×1 IMPLANT
TRANSDUCER W/STOPCOCK (MISCELLANEOUS) ×2 IMPLANT
TUBING CIL FLEX 10 FLL-RA (TUBING) ×2 IMPLANT
WIRE HI TORQ VERSACORE-J 145CM (WIRE) ×1 IMPLANT
WIRE MINAMO 190 (WIRE) ×1 IMPLANT

## 2018-06-14 NOTE — Progress Notes (Addendum)
RRT notified  To see pt. EKG indicating TWI in  All lateral leads. Cp at 10/10 currently. PA-C Callie updated. To see pt .Marland Kitchen Morphine  2 mg ordred stat. Pt on 3 LPM per nasal cannula. Pt to cath Lab for emergent heart cath. C/o chest pain at this time. 5/10 on pain scale. Ntg gtts increased to 3 cc/hr. Will continue to monitor. EKG PRN done. Will update MD accordingly.

## 2018-06-14 NOTE — H&P (View-Only) (Signed)
Progress Note  Patient Name: Shannon Cummings Date of Encounter: 06/14/2018  Primary Cardiologist: [name], MD   Subjective   Patient presented yesterday evening for substernal chest pain. She describes the pain as a "pressure" and ranked it as a 10/10 at its worse. She had associated lightheadedness and nausea with the pain and had a few episodes of vomiting last night. Patient states pain is similar to the pain she had prior to her MI in 2014. Currently chest pain free on IV Heparin and IV Nitroglycerin.   Inpatient Medications    Scheduled Meds: . aspirin EC  81 mg Oral Daily  . cholecalciferol  1,000 Units Oral Daily  . losartan  25 mg Oral QHS  . ondansetron  4 mg Oral Once  . pantoprazole  40 mg Oral Daily  . vitamin B-12  1,000 mcg Oral BID   Continuous Infusions: . heparin 900 Units/hr (06/14/18 0634)  . nitroGLYCERIN 5 mcg/min (06/13/18 2259)   PRN Meds: acetaminophen, fluticasone, nitroGLYCERIN, ondansetron (ZOFRAN) IV, promethazine, temazepam   Vital Signs    Vitals:   06/13/18 2300 06/13/18 2358 06/14/18 0000 06/14/18 0642  BP: (!) 147/91 (!) 136/97 (!) 136/97 114/69  Pulse: (!) 50 (!) 54  (!) 50  Resp: 10 16 11 13   Temp:  (!) 97.5 F (36.4 C)  98 F (36.7 C)  TempSrc:  Oral  Oral  SpO2: 98% 99%  100%  Weight:   62.2 kg   Height:        Intake/Output Summary (Last 24 hours) at 06/14/2018 0721 Last data filed at 06/13/2018 2214 Gross per 24 hour  Intake 100 ml  Output -  Net 100 ml   Last 3 Weights 06/14/2018 06/13/2018 04/30/2018  Weight (lbs) 137 lb 1.6 oz 137 lb 139 lb 2 oz  Weight (kg) 62.188 kg 62.143 kg 63.107 kg      Telemetry    Sinus bradycardia with heart rates in the high 40's to 50's. - Personally Reviewed  ECG    No new ECG tracing today. - Personally Reviewed  Physical Exam   GEN: Caucasian female resting comfortably. Alert and in no acute distress.   Neck: Supple. No JVD. Cardiac: Bradycardic with regular rhythm. No  murmurs, gallops, or rubs. Radial and distal pedal pulses 2+ and equal bilaterally. Respiratory: Clear to auscultation bilaterally. GI: Abdomen soft, non-distended, and non-tender to palpation. Bowel sounds present.  MS: No lower extremity edema. No deformity. Skin: Warm and dry.  Neuro:  No focal deficits. Psych: Normal affect   Labs    Chemistry Recent Labs  Lab 06/13/18 1708 06/14/18 0444  NA 138 135  K 2.9* 3.5  CL 107 98  CO2 20* 28  GLUCOSE 99 120*  BUN 11 11  CREATININE 0.86 0.90  CALCIUM 7.6* 8.2*  GFRNONAA >60 >60  GFRAA >60 >60  ANIONGAP 11 9     Hematology Recent Labs  Lab 06/13/18 1708 06/14/18 0444  WBC 6.8 6.6  RBC 4.22 4.62  HGB 12.7 14.4  HCT 38.2 42.1  MCV 90.5 91.1  MCH 30.1 31.2  MCHC 33.2 34.2  RDW 12.8 12.9  PLT 179 183    Cardiac Enzymes Recent Labs  Lab 06/13/18 1708  TROPONINI 0.08*    Recent Labs  Lab 06/13/18 1729 06/13/18 2051  TROPIPOC 0.08 0.22*     BNPNo results for input(s): BNP, PROBNP in the last 168 hours.   DDimer No results for input(s): DDIMER in the last 168 hours.  Radiology    Dg Chest 2 View  Result Date: 06/13/2018 CLINICAL DATA:  Chest pain, shortness of breath, and weakness since 15:15 today. Previous history of heart disease, stent placement, hypertension, pulmonary nodule, sleep apnea, nonsmoker. EXAM: CHEST - 2 VIEW COMPARISON:  01/09/2017 FINDINGS: Shallow inspiration with mild linear atelectasis in the left lung base. Normal heart size and pulmonary vascularity. No focal airspace disease or consolidation in the lungs. No blunting of costophrenic angles. No pneumothorax. Mediastinal contours appear intact. Coronary stent is present. IMPRESSION: Shallow inspiration with linear atelectasis in the left lung base. Electronically Signed   By: Lucienne Capers M.D.   On: 06/13/2018 19:49    Cardiac Studies   Myoview 01/10/2017: Study Highlights:  Nuclear stress EF: 60%.  There was no ST segment  deviation noted during stress.  The study is normal.  This is a low risk study.  The left ventricular ejection fraction is normal (55-65%).  Patient Profile   Shannon Cummings is a 73 y.o. female with a history of CAD s/p PCI to proximal LAD in 2014, paroxysmal atrial fibrillation on Propafenone and Eliquis, hypertension, and hyperlipidemia, who presented to [facility] ED on 06/13/2018 for evaluation of chest pain and was found to have an NSTEMI.  Assessment & Plan    NSTEMI - Patient presented with sudden onset of severe substernal chest pressure with associated nausea, vomiting, and lightheadedness. Pain similar to the pain she had prior to her MI in 2014. - Troponin peaked at 0.22. - EKG showed no acute ischemic changes. - Currently chest pain free. - Continue IV Heparin and IV Nitroglycerin. - Continue Aspirin. - No beta-blocker due to bradycardia. - Intolerant to statins. On study drug at [facility]. - Echo pending. - Will schedule patient for left heart catheterization later today. Patient is NPO. The patient understands that risks include but are not limited to stroke (1 in 1000), death (1 in 69), kidney failure [usually temporary] (1 in 500), bleeding (1 in 200), allergic reaction [possibly serious] (1 in 200), and agrees to proceed.   Paroxysmal Atrial Fibrillation - Telemetry shows sinus bradycardia with heart rates in the 40's to 50's.  - Patient on Propafenone at home. This was held on admission. Will defer to MD. - Patient on chronic anticoagulation with Eliquis. Last dose was on the morning of 1/9. Currently on IV Heparin.   Hypertension - BP currently well controlled at 114/69. - Continue Losartan 25mg  daily.  - Home HCTZ held in prior to cardiac catheterization.  Hyperlipidemia - Intolerant to statins and Zetia. - On study drug at [facility].  For questions or updates, please contact [place] Please consult [email or web address] for contact info under          Signed, Darreld Mclean, PA-C  06/14/2018, 7:21 AM   ---------------------------------------------------------------------------------------------   History and all data above reviewed.  Patient examined.  I agree with the findings as above.  Patrena Santalucia is a very pleasant 73 year old female who presents with typical angina with a history of coronary artery disease (stent to the proximal LAD 08/14/2012) and a history of paroxysmal atrial fibrillation that is very symptomatic, currently controlled on per path known (shared decision-making with primary cardiologist).  She is currently chest pain-free.  Constitutional: No acute distress Eyes: pupils equally round and reactive to light, sclera non-icteric, normal conjunctiva and lids ENMT: normal dentition, moist mucous membranes Cardiovascular: regular rhythm, bradycardic, no murmurs. S1 and S2 normal. Radial pulses normal bilaterally. No jugular venous  distention.  Respiratory: clear to auscultation bilaterally GI : normal bowel sounds, soft and nontender. No distention.   MSK: extremities warm, well perfused. No edema.  NEURO: grossly nonfocal exam, moves all extremities. PSYCH: alert and oriented x 3, normal mood and affect.   All available labs, radiology testing, previous records reviewed. Agree with documented assessment and plan of my colleague as stated above with the following additions or changes:  Active Problems:   NSTEMI (non-ST elevated myocardial infarction) ([place])    Plan: We discussed the risks, benefits, alternatives to coronary angiography today.  She is agreeable to proceeding.  Her pain is typical for her angina, and it would be important to determine if she has progression of disease versus in-stent restenosis.  No ischemic change on ECG on presentation, troponin was elevated to 0.22.  Creatinine is normal, hemoglobin and platelets are within the normal limits.  She takes Eliquis for atrial fibrillation, we will have  to discuss best antiplatelet/anticoagulant plan if she does require a stent today.  With regard to a class Ic agent (propafenone) in the setting of structural heart disease, this decision has been agreed upon by the patient and her primary cardiologist, and has been observed closely.  She is extremely symptomatic in atrial fibrillation, and has done extremely well on propafenone.  Now that she is presenting with an STEMI, it may be an appropriate time to revisit antiarrhythmic therapy.  I will arrange for an A. fib clinic visit to discuss options including ablation if that is appropriate.  I have answered the patient's questions about coronary artery disease, atrial fibrillation, and procedural details to the best of my ability.  Time Spent Directly with Patient:  I have spent a total of 35 minutes with the patient reviewing hospital notes, telemetry, EKGs, labs and examining the patient as well as establishing an assessment and plan that was discussed personally with the patient.  > 50% of time was spent in direct patient care.  Length of Stay:  LOS: 0 days   Elouise Munroe, MD [facility] 9:28 AM  06/14/2018

## 2018-06-14 NOTE — Progress Notes (Addendum)
Progress Note  Patient Name: Shannon Cummings Date of Encounter: 06/14/2018  Primary Cardiologist: [name], MD   Subjective   Patient presented yesterday evening for substernal chest pain. She describes the pain as a "pressure" and ranked it as a 10/10 at its worse. She had associated lightheadedness and nausea with the pain and had a few episodes of vomiting last night. Patient states pain is similar to the pain she had prior to her MI in 2014. Currently chest pain free on IV Heparin and IV Nitroglycerin.   Inpatient Medications    Scheduled Meds: . aspirin EC  81 mg Oral Daily  . cholecalciferol  1,000 Units Oral Daily  . losartan  25 mg Oral QHS  . ondansetron  4 mg Oral Once  . pantoprazole  40 mg Oral Daily  . vitamin B-12  1,000 mcg Oral BID   Continuous Infusions: . heparin 900 Units/hr (06/14/18 0634)  . nitroGLYCERIN 5 mcg/min (06/13/18 2259)   PRN Meds: acetaminophen, fluticasone, nitroGLYCERIN, ondansetron (ZOFRAN) IV, promethazine, temazepam   Vital Signs    Vitals:   06/13/18 2300 06/13/18 2358 06/14/18 0000 06/14/18 0642  BP: (!) 147/91 (!) 136/97 (!) 136/97 114/69  Pulse: (!) 50 (!) 54  (!) 50  Resp: 10 16 11 13   Temp:  (!) 97.5 F (36.4 C)  98 F (36.7 C)  TempSrc:  Oral  Oral  SpO2: 98% 99%  100%  Weight:   62.2 kg   Height:        Intake/Output Summary (Last 24 hours) at 06/14/2018 0721 Last data filed at 06/13/2018 2214 Gross per 24 hour  Intake 100 ml  Output -  Net 100 ml   Last 3 Weights 06/14/2018 06/13/2018 04/30/2018  Weight (lbs) 137 lb 1.6 oz 137 lb 139 lb 2 oz  Weight (kg) 62.188 kg 62.143 kg 63.107 kg      Telemetry    Sinus bradycardia with heart rates in the high 40's to 50's. - Personally Reviewed  ECG    No new ECG tracing today. - Personally Reviewed  Physical Exam   GEN: Caucasian female resting comfortably. Alert and in no acute distress.   Neck: Supple. No JVD. Cardiac: Bradycardic with regular rhythm. No  murmurs, gallops, or rubs. Radial and distal pedal pulses 2+ and equal bilaterally. Respiratory: Clear to auscultation bilaterally. GI: Abdomen soft, non-distended, and non-tender to palpation. Bowel sounds present.  MS: No lower extremity edema. No deformity. Skin: Warm and dry.  Neuro:  No focal deficits. Psych: Normal affect   Labs    Chemistry Recent Labs  Lab 06/13/18 1708 06/14/18 0444  NA 138 135  K 2.9* 3.5  CL 107 98  CO2 20* 28  GLUCOSE 99 120*  BUN 11 11  CREATININE 0.86 0.90  CALCIUM 7.6* 8.2*  GFRNONAA >60 >60  GFRAA >60 >60  ANIONGAP 11 9     Hematology Recent Labs  Lab 06/13/18 1708 06/14/18 0444  WBC 6.8 6.6  RBC 4.22 4.62  HGB 12.7 14.4  HCT 38.2 42.1  MCV 90.5 91.1  MCH 30.1 31.2  MCHC 33.2 34.2  RDW 12.8 12.9  PLT 179 183    Cardiac Enzymes Recent Labs  Lab 06/13/18 1708  TROPONINI 0.08*    Recent Labs  Lab 06/13/18 1729 06/13/18 2051  TROPIPOC 0.08 0.22*     BNPNo results for input(s): BNP, PROBNP in the last 168 hours.   DDimer No results for input(s): DDIMER in the last 168 hours.  Radiology    Dg Chest 2 View  Result Date: 06/13/2018 CLINICAL DATA:  Chest pain, shortness of breath, and weakness since 15:15 today. Previous history of heart disease, stent placement, hypertension, pulmonary nodule, sleep apnea, nonsmoker. EXAM: CHEST - 2 VIEW COMPARISON:  01/09/2017 FINDINGS: Shallow inspiration with mild linear atelectasis in the left lung base. Normal heart size and pulmonary vascularity. No focal airspace disease or consolidation in the lungs. No blunting of costophrenic angles. No pneumothorax. Mediastinal contours appear intact. Coronary stent is present. IMPRESSION: Shallow inspiration with linear atelectasis in the left lung base. Electronically Signed   By: Lucienne Capers M.D.   On: 06/13/2018 19:49    Cardiac Studies   Myoview 01/10/2017: Study Highlights:  Nuclear stress EF: 60%.  There was no ST segment  deviation noted during stress.  The study is normal.  This is a low risk study.  The left ventricular ejection fraction is normal (55-65%).  Patient Profile   Shannon Cummings is a 73 y.o. female with a history of CAD s/p PCI to proximal LAD in 2014, paroxysmal atrial fibrillation on Propafenone and Eliquis, hypertension, and hyperlipidemia, who presented to the [facility] ED on 06/13/2018 for evaluation of chest pain and was found to have an NSTEMI.  Assessment & Plan    NSTEMI - Patient presented with sudden onset of severe substernal chest pressure with associated nausea, vomiting, and lightheadedness. Pain similar to the pain she had prior to her MI in 2014. - Troponin peaked at 0.22. - EKG showed no acute ischemic changes. - Currently chest pain free. - Continue IV Heparin and IV Nitroglycerin. - Continue Aspirin. - No beta-blocker due to bradycardia. - Intolerant to statins. On study drug at [facility]. - Echo pending. - Will schedule patient for left heart catheterization later today. Patient is NPO. The patient understands that risks include but are not limited to stroke (1 in 1000), death (1 in 61), kidney failure [usually temporary] (1 in 500), bleeding (1 in 200), allergic reaction [possibly serious] (1 in 200), and agrees to proceed.   Paroxysmal Atrial Fibrillation - Telemetry shows sinus bradycardia with heart rates in the 40's to 50's.  - Patient on Propafenone at home. This was held on admission. Will defer to MD. - Patient on chronic anticoagulation with Eliquis. Last dose was on the morning of 1/9. Currently on IV Heparin.   Hypertension - BP currently well controlled at 114/69. - Continue Losartan 25mg  daily.  - Home HCTZ held in prior to cardiac catheterization.  Hyperlipidemia - Intolerant to statins and Zetia. - On study drug at [facility].  For questions or updates, please contact [place] Please consult [email or web address] for contact info under          Signed, Darreld Mclean, PA-C  06/14/2018, 7:21 AM   ---------------------------------------------------------------------------------------------   History and all data above reviewed.  Patient examined.  I agree with the findings as above.  Danice Dippolito is a very pleasant 73 year old female who presents with typical angina with a history of coronary artery disease (stent to the proximal LAD 08/14/2012) and a history of paroxysmal atrial fibrillation that is very symptomatic, currently controlled on per path known (shared decision-making with primary cardiologist).  She is currently chest pain-free.  Constitutional: No acute distress Eyes: pupils equally round and reactive to light, sclera non-icteric, normal conjunctiva and lids ENMT: normal dentition, moist mucous membranes Cardiovascular: regular rhythm, bradycardic, no murmurs. S1 and S2 normal. Radial pulses normal bilaterally. No jugular venous  distention.  Respiratory: clear to auscultation bilaterally GI : normal bowel sounds, soft and nontender. No distention.   MSK: extremities warm, well perfused. No edema.  NEURO: grossly nonfocal exam, moves all extremities. PSYCH: alert and oriented x 3, normal mood and affect.   All available labs, radiology testing, previous records reviewed. Agree with documented assessment and plan of my colleague as stated above with the following additions or changes:  Active Problems:   NSTEMI (non-ST elevated myocardial infarction) ([place])    Plan: We discussed the risks, benefits, alternatives to coronary angiography today.  She is agreeable to proceeding.  Her pain is typical for her angina, and it would be important to determine if she has progression of disease versus in-stent restenosis.  No ischemic change on ECG on presentation, troponin was elevated to 0.22.  Creatinine is normal, hemoglobin and platelets are within the normal limits.  She takes Eliquis for atrial fibrillation, we will have  to discuss best antiplatelet/anticoagulant plan if she does require a stent today.  With regard to a class Ic agent (propafenone) in the setting of structural heart disease, this decision has been agreed upon by the patient and her primary cardiologist, and has been observed closely.  She is extremely symptomatic in atrial fibrillation, and has done extremely well on propafenone.  Now that she is presenting with an STEMI, it may be an appropriate time to revisit antiarrhythmic therapy.  I will arrange for an A. fib clinic visit to discuss options including ablation if that is appropriate.  I have answered the patient's questions about coronary artery disease, atrial fibrillation, and procedural details to the best of my ability.  Time Spent Directly with Patient:  I have spent a total of 35 minutes with the patient reviewing hospital notes, telemetry, EKGs, labs and examining the patient as well as establishing an assessment and plan that was discussed personally with the patient.  > 50% of time was spent in direct patient care.  Length of Stay:  LOS: 0 days   Elouise Munroe, MD [facility] 9:28 AM  06/14/2018

## 2018-06-14 NOTE — Progress Notes (Signed)
ANTICOAGULATION CONSULT NOTE - Follow Up Consult  Pharmacy Consult for heparin Indication: NSTEMI and PAF  Labs: Recent Labs    06/13/18 1708 06/14/18 0444  HGB 12.7 14.4  HCT 38.2 42.1  PLT 179 183  APTT  --  49*  LABPROT 14.4  --   INR 1.13  --   HEPARINUNFRC  --  1.68*  CREATININE 0.86 0.90  TROPONINI 0.08*  --     Assessment: 72yo female subtherapeutic on heparin with initial dosing while Eliquis on hold; no gtt issues or signs of bleeding per RN.  Goal of Therapy:  aPTT 66-102 seconds   Plan:  Will increase heparin gtt by 2-3 units/kg/hr to 900 units/hr and check PTT in 8 hours.    Wynona Neat, PharmD, BCPS  06/14/2018,6:30 AM

## 2018-06-14 NOTE — Progress Notes (Signed)
   Received paged that patient started having 10/10 chest pain again. Repeat EKG showed new T wave inversions in leads anterolateral leads. Went to see patient. She was restless, mildly short of breath, and sweaty. Patient put on nasal cannula and 2mg  of IV Morphine were given. Patient was taken down to cath lab.  Darreld Mclean, PA-C 06/14/2018 4:05 PM

## 2018-06-14 NOTE — Interval H&P Note (Signed)
Cath Lab Visit (complete for each Cath Lab visit)  Clinical Evaluation Leading to the Procedure:   ACS: Yes.    Non-ACS:    Anginal Classification: CCS IV  Anti-ischemic medical therapy: Minimal Therapy (1 class of medications)  Non-Invasive Test Results: No non-invasive testing performed  Prior CABG: No previous CABG   10/10 pain.     History and Physical Interval Note:  06/14/2018 4:07 PM  Shannon Cummings  has presented today for surgery, with the diagnosis of nstemi  The various methods of treatment have been discussed with the patient and family. After consideration of risks, benefits and other options for treatment, the patient has consented to  Procedure(s): LEFT HEART CATH AND CORONARY ANGIOGRAPHY (N/A) as a surgical intervention .  The patient's history has been reviewed, patient examined, no change in status, stable for surgery.  I have reviewed the patient's chart and labs.  Questions were answered to the patient's satisfaction.     Larae Grooms

## 2018-06-14 NOTE — Progress Notes (Signed)
[place] for heparin Indication: chest pain/ACS  Allergies  Allergen Reactions  . Other Anxiety and Other (See Comments)    Intolerance to strong pain medications=  Make her feel anxious and feel like coming out of her skin  . Effexor [Venlafaxine] Other (See Comments)    CAUSED TOO MUCH SEDATION; CANNOT TOLERATE  . Statins Other (See Comments)    Restless legs, bad feeling.  This has occurred with Crestor 5 mg once weekly, Lipitor 10 mg twice weekly, simvastatin daily  . Zetia [Ezetimibe]     Leg aches    Patient Measurements: Height: 5' (152.4 cm) Weight: 137 lb 1.6 oz (62.2 kg) IBW/kg (Calculated) : 45.5 Heparin Dosing Weight: 62 kg  Vital Signs: Temp: 97.4 F (36.3 C) (01/10 1452) Temp Source: Oral (01/10 1452) BP: 110/65 (01/10 1452) Pulse Rate: 50 (01/10 1452)  Labs: Recent Labs    06/13/18 1708 06/14/18 0444 06/14/18 1341  HGB 12.7 14.4  --   HCT 38.2 42.1  --   PLT 179 183  --   APTT  --  49* 62*  LABPROT 14.4  --   --   INR 1.13  --   --   HEPARINUNFRC  --  1.68*  --   CREATININE 0.86 0.90  --   TROPONINI 0.08*  --   --     Estimated Creatinine Clearance: 47.2 mL/min (by C-G formula based on SCr of 0.9 mg/dL).   Medical History: Past Medical History:  Diagnosis Date  . Anginal pain ([place])   . Arthritis   . CAD (coronary artery disease)    a. prior stenting history. b. NSTEMI s/p DES to prox LAD with EF 45% by cath 08/14/12. c. Mild trop elevation shortly after cath ?mild plaque embolization - patent stent on relook. // d. Myoview 8/18: EF 60, normal perfusion; Low Risk  . Fatty infiltration of liver   . GERD (gastroesophageal reflux disease)   . Hyperlipidemia   . Hypertension   . Myocardial infarction ([place]) 2014  . OSA on CPAP   . Osteoporosis   . PAF (paroxysmal atrial fibrillation) ([place])    On rythmol.  . Pulmonary nodule    a. 71mm subpleural nodular density CT 08/2012, instructed to f/u pulm MD.  .  Tubular adenoma of colon 2007    Medications:  Medications Prior to Admission  Medication Sig Dispense Refill Last Dose  . AMBULATORY NON FORMULARY MEDICATION Take 180 mg by mouth daily. Medication Name: bempedoic acid 180 mg vs a placebo.  CLEAR Research Study, drug provided   06/13/2018 at Unknown time  . aspirin EC 81 MG tablet Take 81 mg by mouth at bedtime.   06/12/2018 at 2100  . Cholecalciferol (VITAMIN D3) 25 MCG (1000 UT) CAPS Take 1,000 Units by mouth daily.   06/13/2018 at am  . Cyanocobalamin (B-12) 1000 MCG LOZG Take 1,000 mcg by mouth 2 (two) times daily.    06/13/2018 at am  . ELIQUIS 5 MG TABS tablet TAKE 1 TABLET(5 MG) BY MOUTH TWICE DAILY (Patient taking differently: Take 5 mg by mouth 2 (two) times daily. ) 180 tablet 2 06/13/2018 at 1000  . fluticasone (FLONASE) 50 MCG/ACT nasal spray Place 2 sprays into both nostrils daily. (Patient taking differently: Place 2 sprays into both nostrils daily as needed for allergies or rhinitis. ) 16 g 6 unk at unk  . hydrochlorothiazide (HYDRODIURIL) 25 MG tablet Take 1 tablet (25 mg total) by mouth daily. 90 tablet  3 06/13/2018 at am  . losartan (COZAAR) 25 MG tablet Take 1 tablet (25 mg total) by mouth daily. (Patient taking differently: Take 25 mg by mouth at bedtime. ) 90 tablet 3 unk at unk  . metoprolol succinate (TOPROL-XL) 50 MG 24 hr tablet Take 1 tablet (50 mg total) by mouth daily. Take with or immediately following a meal. (Patient taking differently: Take 50 mg by mouth at bedtime. Take with or immediately following a meal.) 90 tablet 3 06/12/2018 at 2100  . nitroGLYCERIN (NITROSTAT) 0.4 MG SL tablet Place 1 tablet (0.4 mg total) under the tongue every 5 (five) minutes as needed for chest pain (up to 3 doses). (Patient taking differently: Place 0.4 mg under the tongue every 5 (five) minutes x 3 doses as needed for chest pain. ) 25 tablet 6 06/13/2018 at Unknown time  . pantoprazole (PROTONIX) 40 MG tablet TAKE 1 TABLET(40 MG) BY MOUTH DAILY (Patient  taking differently: Take 40 mg by mouth daily. ) 30 tablet 6 06/13/2018 at am  . potassium chloride (K-DUR) 10 MEQ tablet Take 1 tablet (10 mEq total) by mouth daily. 90 tablet 3 06/13/2018 at am  . propafenone (RYTHMOL) 150 MG tablet Take 1 tablet (150 mg total) by mouth 2 (two) times daily. Please call and schedule an appointment for further refills 1st attempt (Patient taking differently: Take 150 mg by mouth every 12 (twelve) hours. ) 180 tablet 0 06/13/2018 at 1000  . temazepam (RESTORIL) 15 MG capsule Take 15 mg by mouth at bedtime as needed for sleep.   unk at [employer]  . acetaminophen (TYLENOL) 325 MG tablet Take 325-650 mg by mouth every 8 (eight) hours as needed (for pain).    unk at [employer]  . venlafaxine XR (EFFEXOR-XR) 37.5 MG 24 hr capsule Take 1 capsule (37.5 mg total) by mouth daily with breakfast. (Patient not taking: Reported on 06/13/2018) 30 capsule 3 Not Taking at Unknown time    Assessment: 42 YOF with h/o Afib on Eliquis at home here with chest pain with elevated troponins. Pharmacy consulted to start IV heparin for ACS. Last dose of apixaban was at 1000 on 1/9. H/H and Plt wnl.   APTT slightly low at 62. CBC wnl. No bleed or issues with the infusion per discussion with RN.  Goal of Therapy:  Heparin level 0.3-0.7 units/ml Monitor platelets by anticoagulation protocol: Yes   Plan:  -Increase heparin infusion to 1050 units/hr. No bolus -F/u 8 hr aPTT -Monitor daily aPTT/heparin level, CBC and s/s of bleeding -Cath planned for later today   Elicia Lamp, PharmD, BCPS Clinical Pharmacist Please check AMION for all [place] contact numbers 06/14/2018 3:21 PM

## 2018-06-15 ENCOUNTER — Encounter (HOSPITAL_COMMUNITY): Payer: Self-pay | Admitting: Physician Assistant

## 2018-06-15 DIAGNOSIS — I255 Ischemic cardiomyopathy: Secondary | ICD-10-CM

## 2018-06-15 DIAGNOSIS — R001 Bradycardia, unspecified: Secondary | ICD-10-CM

## 2018-06-15 LAB — CBC
HCT: 38.5 % (ref 36.0–46.0)
Hemoglobin: 13 g/dL (ref 12.0–15.0)
MCH: 31 pg (ref 26.0–34.0)
MCHC: 33.8 g/dL (ref 30.0–36.0)
MCV: 91.7 fL (ref 80.0–100.0)
PLATELETS: 158 10*3/uL (ref 150–400)
RBC: 4.2 MIL/uL (ref 3.87–5.11)
RDW: 13.2 % (ref 11.5–15.5)
WBC: 5.9 10*3/uL (ref 4.0–10.5)
nRBC: 0 % (ref 0.0–0.2)

## 2018-06-15 LAB — BASIC METABOLIC PANEL
Anion gap: 8 (ref 5–15)
BUN: 11 mg/dL (ref 8–23)
CALCIUM: 7.8 mg/dL — AB (ref 8.9–10.3)
CO2: 25 mmol/L (ref 22–32)
CREATININE: 0.88 mg/dL (ref 0.44–1.00)
Chloride: 102 mmol/L (ref 98–111)
GFR calc non Af Amer: >60 mL/min (ref >60)
Glucose, Bld: 81 mg/dL (ref 70–99)
Potassium: 3.2 mmol/L — ABNORMAL LOW (ref 3.5–5.1)
Sodium: 135 mmol/L (ref 135–145)

## 2018-06-15 MED ORDER — ASPIRIN EC 81 MG PO TBEC: 81.0000 mg | DELAYED_RELEASE_TABLET | Freq: Every day | ORAL | Status: DC

## 2018-06-15 MED ORDER — CLOPIDOGREL BISULFATE 75 MG PO TABS: 75.0000 mg | ORAL_TABLET | Freq: Every day | ORAL | 11 refills | Status: DC

## 2018-06-15 MED ORDER — APIXABAN 5 MG PO TABS
5.0000 mg | ORAL_TABLET | Freq: Two times a day (BID) | ORAL | Status: DC
  Administered 2018-06-15: 5 mg via ORAL
  Filled 2018-06-15: qty 1

## 2018-06-15 MED ORDER — POTASSIUM CHLORIDE CRYS ER 20 MEQ PO TBCR
40.0000 meq | EXTENDED_RELEASE_TABLET | Freq: Once | ORAL | Status: AC
  Administered 2018-06-15: 08:00:00 40 meq via ORAL
  Filled 2018-06-15: qty 2

## 2018-06-15 NOTE — Progress Notes (Signed)
Per nurse - pt walked without any problems this afternoon. O2 sat 98%, HR 64, BP 139/75 and she feels much better. OK to DC. Billie Intriago PA-C

## 2018-06-15 NOTE — Progress Notes (Signed)
CARDIAC REHAB PHASE I   PRE:  Rate/Rhythm: 57 SR  BP:  Supine:   Sitting: 145/84  Standing:    SaO2: 98% RA  MODE:  Ambulation: 400  ft   POST:  Rate/Rhythm: 68 SR  BP:  Supine:   Sitting: 157/88  Standing:    SaO2: 98% RA Did not tolerate ambulation well d/t weakness and dizziness upon return to room.  Checked BP upon return to work, 157/88.  Laid back in bed with feet elevated, symptoms resolved in approximately 3 minutes, reported to nurse and [place]inton Dyer, [place].  Educated regarding MI, angina symptoms, NTG usage, activity progression, and exercise guidelines completed, referred to phase II cardiac rehab in [place]. [id number]  Liliane Channel RN, BSN 06/15/2018 10:33 AM

## 2018-06-15 NOTE — Progress Notes (Signed)
TRBAND REMOVAL  LOCATION:    right radial  DEFLATED PER PROTOCOL:    Yes.    TIME BAND OFF / DRESSING APPLIED:    2145   SITE UPON ARRIVAL:    Level 1  SITE AFTER BAND REMOVAL:    Level 1(bruising)  CIRCULATION SENSATION AND MOVEMENT:    Within Normal Limits   Yes.    COMMENTS:   Pt.tolerated well

## 2018-06-15 NOTE — Progress Notes (Signed)
Ambulated patient 666ft.  No c/o pain, SOB or dizziness.  BP 139/75, RA 98%, HR 64.  She states she feels much better.  Notified Dayna Dunn.  Ok to let patient go home

## 2018-06-15 NOTE — Discharge Summary (Addendum)
Discharge Summary    Patient ID: Shannon Cummings,  MRN: [id number], DOB/AGE: 05-Aug-1945 73 y.o.  Admit date: 06/13/2018 Discharge date: 06/15/2018  Primary Care Provider: [name] Primary Cardiologist: [name], MD Primary Electrophysiologist:  None  Discharge Diagnoses    Principal Problem:   NSTEMI (non-ST elevated myocardial infarction) [facility]) Active Problems:   Hyperlipidemia   Obstructive sleep apnea   Essential hypertension   Paroxysmal atrial fibrillation (HCC)   CAD (coronary artery disease)   Sinus bradycardia   Ischemic cardiomyopathy   Diagnostic Studies/Procedures    Cardiac Cath 06/14/2018 Conclusion     Previously placed Prox LAD stent is widely patent.  RPDA lesion is 50% stenosed.  Ost 1st Mrg lesion is 25% stenosed.  1st Mrg lesion is 80% stenosed.  A drug-eluting stent was successfully placed using a STENT SYNERGY DES 2.5X12.  Post intervention, there is a 0% residual stenosis.  Ost 1st Diag lesion is 25% stenosed.  The left ventricular systolic function is normal.  LV end diastolic pressure is normal. LVEDP 11 mm Hg.  The left ventricular ejection fraction is 45-50% by visual estimate.  There is no aortic valve stenosis.   Continue aggressive secondary prevention.  Restart Eliquis in AM.  Antiplatelet recs as noted.    Pain prior to procedure, seemed out of proportion to the degree of CAD.  Wall motion abnormality present.  May have had some transient occlusion with vasospasm.   No IV antiplatelet agent given due to fairly recent Eliquis dose.  Brilinta given in lab due to rapid onset.  Since she is on Eliquis long term, would switch to Clopidogrel as noted in the orders: 300 mg dose tomorrow and 75 mg daily after that time.     2D echo 06/14/2018 Study Conclusions  - Left ventricle: The cavity size was normal. Systolic function was   mildly to moderately reduced. The estimated ejection fraction was   in the range  of 40% to 45%. Hypokinesis of the   basal-midanterolateral and inferolateral myocardium; consistent   with ischemia or infarction in the distribution of the left   circumflex coronary artery. Doppler parameters are consistent   with abnormal left ventricular relaxation (grade 1 diastolic   dysfunction). Doppler parameters are consistent with elevated   mean left atrial filling pressure. - Pericardium, extracardiac: There was a left pleural effusion.     _____________     History of Present Illness     Shannon Cummings is a 73 y.o. female with history of CAD with prior PCI, NSTEMI s/p DES to prox LAD 2014, ischemic cardiomyopathy EF 45% in 2014, GERD, HTN, HLD (intolerant of statins, on study drug through [facility]), OSA on CPAP, PAF on propafenone PTA, pulmonary nodule, fatty liver who presented to [facility]13[facility]18 with complaints of chest pressure similar to prior MI with accompanying nausea. Over the next 30 minutes or so pain worsened in intensity and EMS was called; she received morphine, full dose aspirin, and 2 sublingual nitros in route with marked improvement but not complete resolution of her chest pain.  She was brought to the [facility] ED for further evaluation.  In the ED, ECG showed sinus bradycardia without any acute ST or T wave changes.  Initial troponin was 0.08 and delta troponin was 0.22.  The remainder of the patient's labs were within normal limits. Her beta blocker was held due to HR upper 40s-low 50s. HCTZ was held in anticipation of cath and she was continued on  losartan. She was started on IV heparin and IV NTG and admitted for further evaluation. Last dose of Eliquis PTA was 06/13/18.  Hospital Course    1. CAD with NSTEMI - cath results as noted above, patent prior LAD stent, 80% OM1 s/p DES, normal LVEDP, EF 45-50%. Pain prior to procedure, seemed out of proportion to the degree of CAD. Wall motion abnormality present. It was felt she may have had some transient  occlusion with vasospasm. She was given Brilinta in the cath lab due to rapid onset, then was loaded with Plavix 300mg  today with plan to continue 75mg  daily after. She will continue ASA x 1 month and Clopidogrel 75mg  daily for 12 months . Per cath note, if there were bleeding issues, aspirin could be stopped before 1 month. Last dose of ASA 07/14/18 (will mark 31 days total). Bleeding precautions reviewed with patient.  2. HTN with hypotension this admission - losartan and beta blocker were held on admission as she had softer BP and sinus bradycardia in the upper 40s-50s at times. Some of her hypotension was likely due to IV NTG. VSS this morning. Today she walked with cardiac rehab and felt well during the walk all the way to the [facility] tower but got dizzy when she got back to her room. HR was in the 60s, NSR, and BP 157/88, pulse ox normal. Given her episode of dizziness Dr. Margaretann Loveless recommends to hold off resuming metoprolol at discharge, but to re-add losartan tomorrow with hold parameters (hold for SBP <110 for both). She was observed over the next 2 hours and felt better after eating and sitting in a chair. She ambulated once more without any problems and wants to go home. The patient follows her vitals diligently at home and will keep [place] informed of any issues.  3. Hypokalemia - unclear etiology, but was normal as OP. K 3.2 today, and repletion added. Will continue low dose KCl as OP. Was on HCTZ PTA which was stopped due to softer BP at times. Recommend recheck in follow-up.  4. Hyperlipidemia - intolerant of statins, on study drug through [facility].  5. H/o paroxysmal atrial fib - Dr. Margaretann Loveless has recommended holding propafenone in setting of structural heart disease - she recommends f/u afib clinic to discuss options including ablation if appropriate. Eliquis was resumed today. She was in sinus for the duration of her stay.  6. Ischemic cardiomyopathy EF 40-45% by echo, 45-50% by cath - LVEDP was normal by  cath. No signs of CHF on exam.  Dr. Margaretann Loveless has seen and examined the patient today and feels she is stable for discharge. I have sent a message to our office's scheduling team requesting a follow-up appointment (AF clinic and 7 day TOC), and our office will call the patient with this information. See med list below. Effexor was removed from list as pt indicated she was not taking.  _____________  Vital Signs. BP (!) 117/59 (BP Location: Right Arm)   Pulse (!) 56   Temp 98 F (36.7 C) (Oral)   Resp 15   Ht 5' (1.524 m)   Wt 63.1 kg   SpO2 99%   BMI 27.17 kg/m  General: Well developed, well nourished [facility], in no acute distress. Head: Normocephalic, atraumatic, sclera non-icteric, no xanthomas, nares are without discharge. Neck: JVP not elevated. Lungs: Clear bilaterally to auscultation without wheezes, rales, or rhonchi. Breathing is unlabored. Heart: RRR S1 S2 without murmurs, rubs, or gallops.  Abdomen: Soft, non-tender, non-distended with normoactive  bowel sounds. No rebound/guarding. Extremities: No clubbing or cyanosis. No edema. Distal pedal pulses are 2+ and equal bilaterally. Right wrist with moderate ecchymosis, no overt hematoma, good pulse and no hemodynamic compromise.  Neuro: Alert and oriented X 3. Moves all extremities spontaneously. Psych:  Responds to questions appropriately with a normal affect.   Labs & Radiologic Studies    CBC Recent Labs    06/14/18 0444 06/15/18 0501  WBC 6.6 5.9  HGB 14.4 13.0  HCT 42.1 38.5  MCV 91.1 91.7  PLT 183 147   Basic Metabolic Panel Recent Labs    06/14/18 0444 06/15/18 0501  NA 135 135  K 3.5 3.2*  CL 98 102  CO2 28 25  GLUCOSE 120* 81  BUN 11 11  CREATININE 0.90 0.88  CALCIUM 8.2* 7.8*   Cardiac Enzymes Recent Labs    06/13/18 1708  TROPONINI 0.08*  _____________  Dg Chest 2 View  Result Date: 06/13/2018 CLINICAL DATA:  Chest pain, shortness of breath, and weakness since 15:15 today. Previous history of  heart disease, stent placement, hypertension, pulmonary nodule, sleep apnea, nonsmoker. EXAM: CHEST - 2 VIEW COMPARISON:  01/09/2017 FINDINGS: Shallow inspiration with mild linear atelectasis in the left lung base. Normal heart size and pulmonary vascularity. No focal airspace disease or consolidation in the lungs. No blunting of costophrenic angles. No pneumothorax. Mediastinal contours appear intact. Coronary stent is present. IMPRESSION: Shallow inspiration with linear atelectasis in the left lung base. Electronically Signed   By: Lucienne Capers M.D.   On: 06/13/2018 19:49   Disposition   Pt is being discharged home today in good condition.  Follow-up Plans & Appointments    Follow-up Information    Nahser, Wonda Cheng, MD Follow up.   Specialty:  Cardiology Why:  Our office will call you for a follow-up appointment. Please call the office if you have not heard from [place] within 3 days. Contact information: [address] [place] [address] [place] Follow up.   Specialty:  Cardiology Why:  The afib office will call you for a follow-up appointment. Please call the office if you have not heard from them within 3 days. Contact information: [address] [id number] [place] [phone]         Discharge Instructions    AMB Referral to Cardiac Rehabilitation - Phase II   Complete by:  As directed    Diagnosis:  Coronary Stents   Amb Referral to Cardiac Rehabilitation   Complete by:  As directed    Diagnosis:   Coronary Stents NSTEMI     Diet - low sodium heart healthy   Complete by:  As directed    Discharge instructions   Complete by:  As directed    You will go home on 3 blood thinners - aspirin, Plavix/clopidogrel, and Eliquis. Your last dose of aspirin will be 07/14/2018. You will likely continue Plavix/clopidogrel for 1 year. If you notice any bleeding such as blood in  stool, black tarry stools, blood in urine, nosebleeds or any other unusual bleeding, call your doctor immediately. It is not normal to have this kind of bleeding while on a blood thinner and usually indicates there is an underlying problem with one of your body systems that needs to be checked out.   No driving for 1 week. No lifting over 10 lbs for 2 weeks. No sexual activity for 2 weeks.  Keep procedure site clean & dry. If you notice increased pain, swelling, bleeding or pus, call/return!  You may shower, but no soaking baths/hot tubs/pools for 1 week.   Please note several medicines have been stopped for now. Effexor was removed from your medicine list since you indicated you're not taking this. Do not take any propafenone. Do not take any hydrochlorothiazide (stopped due to low blood pressure). Do not take any metoprolol because of your dizziness in the hospital. We might restart a lower dose in the office.  Instructions for losartan: restart tomorrow. Do not take if your blood pressure is less than 110 on the top number.   Increase activity slowly   Complete by:  As directed       Discharge Medications   Allergies as of 06/15/2018      Reactions   Other Anxiety, Other (See Comments)   Intolerance to strong pain medications=  Make her feel anxious and feel like coming out of her skin   Effexor [venlafaxine] Other (See Comments)   CAUSED TOO MUCH SEDATION; CANNOT TOLERATE   Statins Other (See Comments)   Restless legs, bad feeling.  This has occurred with Crestor 5 mg once weekly, Lipitor 10 mg twice weekly, simvastatin daily   Zetia [ezetimibe]    Leg aches      Medication List    STOP taking these medications   hydrochlorothiazide 25 MG tablet Commonly known as:  HYDRODIURIL   metoprolol succinate 50 MG 24 hr tablet Commonly known as:  TOPROL-XL   propafenone 150 MG tablet Commonly known as:  RYTHMOL   venlafaxine XR 37.5 MG 24 hr capsule Commonly known as:  EFFEXOR-XR      TAKE these medications   acetaminophen 325 MG tablet Commonly known as:  TYLENOL Take 325-650 mg by mouth every 8 (eight) hours as needed (for pain).   AMBULATORY NON FORMULARY MEDICATION Take 180 mg by mouth daily. Medication Name: bempedoic acid 180 mg vs a placebo.  CLEAR Research Study, drug provided   aspirin EC 81 MG tablet Take 1 tablet (81 mg total) by mouth daily. For 1 month total then stop - last dose 07/14/2018. What changed:    when to take this  additional instructions   B-12 1000 MCG Lozg Take 1,000 mcg by mouth 2 (two) times daily.   clopidogrel 75 MG tablet Commonly known as:  PLAVIX Take 1 tablet (75 mg total) by mouth daily. Start taking on:  June 16, 2018   ELIQUIS 5 MG Tabs tablet Generic drug:  apixaban TAKE 1 TABLET(5 MG) BY MOUTH TWICE DAILY What changed:  See the new instructions.   fluticasone 50 MCG/ACT nasal spray Commonly known as:  FLONASE Place 2 sprays into both nostrils daily. What changed:    when to take this  reasons to take this   losartan 25 MG tablet Commonly known as:  COZAAR Take 1 tablet (25 mg total) by mouth daily. What changed:  when to take this   nitroGLYCERIN 0.4 MG SL tablet Commonly known as:  NITROSTAT Place 1 tablet (0.4 mg total) under the tongue every 5 (five) minutes as needed for chest pain (up to 3 doses). What changed:    when to take this  reasons to take this   pantoprazole 40 MG tablet Commonly known as:  PROTONIX TAKE 1 TABLET(40 MG) BY MOUTH DAILY What changed:  See the new instructions.   potassium chloride 10 MEQ tablet Commonly known as:  K-DUR Take 1 tablet (  10 mEq total) by mouth daily.   temazepam 15 MG capsule Commonly known as:  RESTORIL Take 15 mg by mouth at bedtime as needed for sleep.   Vitamin D3 25 MCG (1000 UT) Caps Take 1,000 Units by mouth daily.       Hold parameters were included under Add Med Details on pt's instructions.  Allergies:  Allergies  Allergen  Reactions  . Other Anxiety and Other (See Comments)    Intolerance to strong pain medications=  Make her feel anxious and feel like coming out of her skin  . Effexor [Venlafaxine] Other (See Comments)    CAUSED TOO MUCH SEDATION; CANNOT TOLERATE  . Statins Other (See Comments)    Restless legs, bad feeling.  This has occurred with Crestor 5 mg once weekly, Lipitor 10 mg twice weekly, simvastatin daily  . Zetia [Ezetimibe]     Leg aches    Aspirin prescribed at discharge?  Yes High Intensity Statin Prescribed? (Lipitor 40-80mg  or Crestor 20-40mg ): No: intolerant Beta Blocker Prescribed? yes For EF <40%, was ACEI/ARB Prescribed?  yes ADP Receptor Inhibitor Prescribed? (i.e. Plavix etc.-Includes Medically Managed Patients): Yes For EF <40%, Aldosterone Inhibitor Prescribed? No: hypotension Was EF assessed during THIS hospitalization? Yes Was Cardiac Rehab II ordered? (Included Medically managed Patients): Will see cardiac rehab team after this DC summary completed, further plans to follow   Outstanding Labs/Studies   Recommend BMET  Duration of Discharge Encounter   Greater than 30 minutes including physician time.  Signed, Nedra Hai Dunn PA-C 06/15/2018, 10:31 AM  ---------------------------------------------------------------------------------------------   History and all data above reviewed.  Patient examined.  I agree with the findings as above.  Shannon Cummings is feeling well post PCI. No chest pain.  Constitutional: No acute distress Cardiovascular: regular rhythm, normal rate, no murmurs. S1 and S2 normal. Right radial site has small soft ecchymosis. No jugular venous distention.  Respiratory: clear to auscultation bilaterally GI : normal bowel sounds, soft and nontender. No distention.   MSK: extremities warm, well perfused. No edema.  NEURO: grossly nonfocal exam, moves all extremities. PSYCH: alert and oriented x 3, normal mood and affect.   All available labs,  radiology testing, previous records reviewed. Agree with documented assessment and plan of my colleague as stated above with the following additions or changes:  Principal Problem:   NSTEMI (non-ST elevated myocardial infarction) ([place]) Active Problems:   Hyperlipidemia   Obstructive sleep apnea   Essential hypertension   Paroxysmal atrial fibrillation (HCC)   CAD (coronary artery disease)   Sinus bradycardia   Ischemic cardiomyopathy    Plan: - can restart losartan tomorrow. - hold metoprolol until follow up. - hold propafenone indefinitely until Afib clinic visit.   -Continue asa/plavix/eliquis for 1 month then drop asipirin. I have instructed the patient that dual antiplatelet therapy should be taken for 1 year without interruption.  We have discussed the consequences of interrupted dual antiplatelet therapy and the risk for in-stent thrombosis.    Time Spent Directly with Patient:  I have spent a total of 35 minutes with the patient reviewing hospital notes, telemetry, EKGs, labs and examining the patient as well as establishing an assessment and plan that was discussed personally with the patient.  > 50% of time was spent in direct patient care.  Length of Stay:  LOS: 1 day   Elouise Munroe, MD [facility] 10:31 AM  06/15/2018

## 2018-06-17 ENCOUNTER — Encounter (HOSPITAL_COMMUNITY): Payer: Self-pay | Admitting: Interventional Cardiology

## 2018-06-17 ENCOUNTER — Other Ambulatory Visit: Payer: Self-pay

## 2018-06-17 ENCOUNTER — Emergency Department (HOSPITAL_COMMUNITY)
Admission: EM | Admit: 2018-06-17 | Discharge: 2018-06-17 | Disposition: A | Discharge status: Home / Self Care | Payer: Medicare Other | Attending: Emergency Medicine | Admitting: Emergency Medicine

## 2018-06-17 ENCOUNTER — Emergency Department (HOSPITAL_COMMUNITY): Payer: Medicare Other

## 2018-06-17 DIAGNOSIS — I10 Essential (primary) hypertension: Secondary | ICD-10-CM | POA: Insufficient documentation

## 2018-06-17 DIAGNOSIS — I2510 Atherosclerotic heart disease of native coronary artery without angina pectoris: Secondary | ICD-10-CM | POA: Diagnosis not present

## 2018-06-17 DIAGNOSIS — I480 Paroxysmal atrial fibrillation: Secondary | ICD-10-CM | POA: Insufficient documentation

## 2018-06-17 DIAGNOSIS — I4891 Unspecified atrial fibrillation: Secondary | ICD-10-CM | POA: Diagnosis not present

## 2018-06-17 DIAGNOSIS — Z79899 Other long term (current) drug therapy: Secondary | ICD-10-CM | POA: Insufficient documentation

## 2018-06-17 DIAGNOSIS — R110 Nausea: Secondary | ICD-10-CM | POA: Diagnosis not present

## 2018-06-17 DIAGNOSIS — Z7902 Long term (current) use of antithrombotics/antiplatelets: Secondary | ICD-10-CM | POA: Insufficient documentation

## 2018-06-17 DIAGNOSIS — I252 Old myocardial infarction: Secondary | ICD-10-CM | POA: Diagnosis not present

## 2018-06-17 DIAGNOSIS — R079 Chest pain, unspecified: Secondary | ICD-10-CM | POA: Diagnosis not present

## 2018-06-17 DIAGNOSIS — E785 Hyperlipidemia, unspecified: Secondary | ICD-10-CM | POA: Diagnosis not present

## 2018-06-17 DIAGNOSIS — R0602 Shortness of breath: Secondary | ICD-10-CM | POA: Diagnosis not present

## 2018-06-17 DIAGNOSIS — R002 Palpitations: Secondary | ICD-10-CM | POA: Diagnosis not present

## 2018-06-17 DIAGNOSIS — R0789 Other chest pain: Secondary | ICD-10-CM | POA: Diagnosis not present

## 2018-06-17 LAB — I-STAT TROPONIN, ED
Troponin i, poc: 0.11 ng/mL (ref 0.00–0.08)
Troponin i, poc: 0.18 ng/mL (ref 0.00–0.08)
Troponin i, poc: 0.16 ng/mL (ref 0.00–0.08)

## 2018-06-17 LAB — CBC WITH DIFFERENTIAL/PLATELET
Abs Immature Granulocytes: 0.05 10*3/uL (ref 0.00–0.07)
Basophils Absolute: 0 10*3/uL (ref 0.0–0.1)
Basophils Relative: 0 %
EOS ABS: 0.1 10*3/uL (ref 0.0–0.5)
Eosinophils Relative: 2 %
HEMATOCRIT: 43.7 % (ref 36.0–46.0)
Hemoglobin: 14.4 g/dL (ref 12.0–15.0)
Immature Granulocytes: 1 %
Lymphocytes Relative: 30 %
Lymphs Abs: 2.2 10*3/uL (ref 0.7–4.0)
MCH: 30.1 pg (ref 26.0–34.0)
MCHC: 33 g/dL (ref 30.0–36.0)
MCV: 91.2 fL (ref 80.0–100.0)
Monocytes Absolute: 0.6 10*3/uL (ref 0.1–1.0)
Monocytes Relative: 8 %
Neutro Abs: 4.5 10*3/uL (ref 1.7–7.7)
Neutrophils Relative %: 59 %
Platelets: 194 10*3/uL (ref 150–400)
RBC: 4.79 MIL/uL (ref 3.87–5.11)
RDW: 13 % (ref 11.5–15.5)
WBC: 7.4 10*3/uL (ref 4.0–10.5)
nRBC: 0 % (ref 0.0–0.2)

## 2018-06-17 LAB — CBG MONITORING, ED: Glucose-Capillary: 101 mg/dL — ABNORMAL HIGH (ref 70–99)

## 2018-06-17 LAB — BASIC METABOLIC PANEL
Anion gap: 11 (ref 5–15)
BUN: 10 mg/dL (ref 8–23)
CALCIUM: 8.7 mg/dL — AB (ref 8.9–10.3)
CO2: 23 mmol/L (ref 22–32)
Chloride: 104 mmol/L (ref 98–111)
Creatinine, Ser: 0.98 mg/dL (ref 0.44–1.00)
GFR calc Af Amer: >60 mL/min (ref >60)
GFR, EST NON AFRICAN AMERICAN: 58 mL/min — AB (ref >60)
Glucose, Bld: 105 mg/dL — ABNORMAL HIGH (ref 70–99)
Potassium: 3.6 mmol/L (ref 3.5–5.1)
SODIUM: 138 mmol/L (ref 135–145)

## 2018-06-17 NOTE — Discharge Instructions (Signed)
Follow-up with your cardiologist.  Return for worsening pain shortness of breath.

## 2018-06-17 NOTE — ED Triage Notes (Signed)
Pt BIB [facility] for eval of sudden onset palpitations transitioning into CP L sided, non radiating starting around 1530. Pt took 2 SL NTG at home, 1 more NTG by EMS. Pt had stent placed on Thursday, was recently seen here for MI. Pt received 324 ASA by EMS. Pt reports weakness and sharp pains intermittently since DC from hospital.

## 2018-06-17 NOTE — ED Notes (Signed)
Pt CBG 101. Notified Maudie Mercury, Therapist, sports.

## 2018-06-17 NOTE — ED Notes (Signed)
Fr. Tyrone Nine notified of elevated istat trop of 0.11

## 2018-06-17 NOTE — ED Provider Notes (Signed)
[facility] EMERGENCY DEPARTMENT Provider Note   CSN: [id number] Arrival date & time: 06/17/18  1650     History   Chief Complaint Chief Complaint  Patient presents with  . Chest Pain    HPI Shannon Cummings is a 73 y.o. female.  73 yo F with a chief complaint of chest pain.  This started about 330.  Happened while at rest.  Felt like her most recent heart attack but less severe.  No nausea with this 1.  She had some mild back pain this morning when she woke up.  Currently on Plavix and Eliquis.  She took 2 nitroglycerin at home with minimal improvement got a third with EMS and 325 aspirin with some significant improvement.  She felt that her heart was racing when the pain onset, attributes this to her atrial fibrillation.  Not in A. fib per EMS upon their arrival.  The history is provided by the patient.  Chest Pain  Pain location:  Epigastric Pain quality: aching, crushing and sharp   Pain radiates to:  Does not radiate Pain severity:  Mild Onset quality:  Sudden Duration:  2 hours Timing:  Constant Progression:  Partially resolved Chronicity:  Recurrent Context: at rest   Relieved by:  Nitroglycerin and rest Worsened by:  Nothing Ineffective treatments:  None tried Associated symptoms: palpitations and shortness of breath   Associated symptoms: no dizziness, no fever, no headache, no nausea and no vomiting     Past Medical History:  Diagnosis Date  . Arthritis   . CAD (coronary artery disease)    a. prior stenting history. b. NSTEMI s/p DES to prox LAD with EF 45% by cath 08/14/12. c. Mild trop elevation shortly after cath ?mild plaque embolization - patent stent on relook. // d. Myoview 8/18: EF 60, normal perfusion; Low Risk. e. NSTEMI 06/2018- patent prior LAD stent, 80% OM1 s/p DES, normal LVEDP, EF 45-50%, moderate residual disease treated medically.  . Fatty infiltration of liver   . GERD (gastroesophageal reflux disease)   . Hyperlipidemia   .  Hypertension   . Hypokalemia   . Hypotension   . Myocardial infarction ([place]) 2014  . OSA on CPAP   . Osteoporosis   . PAF (paroxysmal atrial fibrillation) ([place])    On rythmol previously  . Pulmonary nodule    a. 24mm subpleural nodular density CT 08/2012, instructed to f/u pulm MD.  . Sinus bradycardia   . Tubular adenoma of colon 2007    Patient Active Problem List   Diagnosis Date Noted  . Sinus bradycardia 06/15/2018  . Ischemic cardiomyopathy 06/15/2018  . NSTEMI (non-ST elevated myocardial infarction) ([place]) 06/13/2018  . GERD (gastroesophageal reflux disease) 07/10/2016  . History of colonic polyps 02/25/2016  . Antiplatelet or antithrombotic long-term use 02/25/2016  . Nodule of right lung 10/13/2012  . History of non-ST elevation myocardial infarction (NSTEMI) 08/14/2012  . Hepatic steatosis 12/03/2011  . Paroxysmal atrial fibrillation ([place]) 12/22/2010  . CAD (coronary artery disease) 12/22/2010  . INSOMNIA 04/13/2010  . Hyperlipidemia 03/19/2010  . Essential hypertension 03/19/2010  . Obstructive sleep apnea 03/17/2010    Past Surgical History:  Procedure Laterality Date  . CORONARY ANGIOPLASTY WITH STENT PLACEMENT  08/14/2012   LAD  Dr [name]  . CORONARY STENT INTERVENTION N/A 06/14/2018   Procedure: CORONARY STENT INTERVENTION;  Surgeon: Jettie Booze, MD;  Location: [facility];  Service: Cardiovascular;  Laterality: N/A;  . CORONARY STENT PLACEMENT  2000  .  hair restoration  02/2017  . LEFT HEART CATH AND CORONARY ANGIOGRAPHY N/A 06/14/2018   Procedure: LEFT HEART CATH AND CORONARY ANGIOGRAPHY;  Surgeon: Jettie Booze, MD;  Location: [facility];  Service: Cardiovascular;  Laterality: N/A;  . LEFT HEART CATHETERIZATION WITH CORONARY ANGIOGRAM N/A 08/14/2012   Procedure: LEFT HEART CATHETERIZATION WITH CORONARY ANGIOGRAM;  Surgeon: [name], MD;  Location: [facility] CATH LAB;  Service: Cardiovascular;  Laterality: N/A;  . LEFT HEART  CATHETERIZATION WITH CORONARY ANGIOGRAM N/A 08/19/2012   Procedure: LEFT HEART CATHETERIZATION WITH CORONARY ANGIOGRAM;  Surgeon: [name], MD;  Location: [facility] CATH LAB;  Service: Cardiovascular;  Laterality: N/A;  . ROTATOR CUFF REPAIR    . TONSILLECTOMY    . TOTAL ABDOMINAL HYSTERECTOMY       OB History   No obstetric history on file.      Home Medications    Prior to Admission medications   Medication Sig Start Date End Date Taking? Authorizing Provider  acetaminophen (TYLENOL) 325 MG tablet Take 325-650 mg by mouth every 8 (eight) hours as needed (for pain).    Yes [provider]  AMBULATORY NON FORMULARY MEDICATION Take 180 mg by mouth daily. Medication Name: bempedoic acid 180 mg vs a placebo.  CLEAR Research Study, drug provided 12/21/15  Yes Crenshaw, Denice Bors, MD  aspirin EC 81 MG tablet Take 1 tablet (81 mg total) by mouth daily. For 1 month total then stop - last dose 07/14/2018. 06/15/18  Yes Dunn, Nedra Hai, PA-C  Cholecalciferol (VITAMIN D3) 25 MCG (1000 UT) CAPS Take 1,000 Units by mouth daily.   Yes [provider]  clopidogrel (PLAVIX) 75 MG tablet Take 1 tablet (75 mg total) by mouth daily. 06/16/18  Yes Dunn, Dayna N, PA-C  Cyanocobalamin (B-12) 1000 MCG LOZG Take 1,000 mcg by mouth 2 (two) times daily.    Yes [provider]  ELIQUIS 5 MG TABS tablet TAKE 1 TABLET(5 MG) BY MOUTH TWICE DAILY Patient taking differently: Take 5 mg by mouth 2 (two) times daily.  09/17/17  Yes Nahser, Wonda Cheng, MD  fluticasone (FLONASE) 50 MCG/ACT nasal spray Place 2 sprays into both nostrils daily. Patient taking differently: Place 2 sprays into both nostrils daily as needed for allergies or rhinitis.  09/12/16  Yes [name], MD  losartan (COZAAR) 25 MG tablet Take 1 tablet (25 mg total) by mouth daily. 08/15/17  Yes Nahser, Wonda Cheng, MD  nitroGLYCERIN (NITROSTAT) 0.4 MG SL tablet Place 1 tablet (0.4 mg total) under the tongue every 5 (five) minutes as needed  for chest pain (up to 3 doses). Patient taking differently: Place 0.4 mg under the tongue every 5 (five) minutes x 3 doses as needed for chest pain.  06/29/16  Yes Nahser, Wonda Cheng, MD  pantoprazole (PROTONIX) 40 MG tablet TAKE 1 TABLET(40 MG) BY MOUTH DAILY Patient taking differently: Take 40 mg by mouth daily.  05/22/18  Yes [name], MD  potassium chloride (K-DUR) 10 MEQ tablet Take 1 tablet (10 mEq total) by mouth daily. 08/15/17  Yes Nahser, Wonda Cheng, MD  temazepam (RESTORIL) 15 MG capsule Take 15 mg by mouth at bedtime as needed for sleep.   Yes [provider]    Family History Family History  Problem Relation Age of Onset  . Heart disease Father   . Alzheimer's disease Mother   . Breast cancer Maternal Aunt   . Colon cancer Neg Hx     Social History Social History  Tobacco Use  . Smoking status: Never Smoker  . Smokeless tobacco: Never Used  Substance Use Topics  . Alcohol use: Yes    Comment: 2 drinks daily   . Drug use: No     Allergies   Other; Effexor [venlafaxine]; Statins; and Zetia [ezetimibe]   Review of Systems Review of Systems  Constitutional: Negative for chills and fever.  HENT: Negative for congestion and rhinorrhea.   Eyes: Negative for redness and visual disturbance.  Respiratory: Positive for shortness of breath. Negative for wheezing.   Cardiovascular: Positive for chest pain and palpitations.  Gastrointestinal: Negative for nausea and vomiting.  Genitourinary: Negative for dysuria and urgency.  Musculoskeletal: Negative for arthralgias and myalgias.  Skin: Negative for pallor and wound.  Neurological: Negative for dizziness and headaches.     Physical Exam Updated Vital Signs BP 136/70   Pulse 61   Temp 98.1 F (36.7 C) (Oral)   Resp 14   Ht 5' (1.524 m)   Wt 63 kg   SpO2 97%   BMI 27.13 kg/m   Physical Exam Vitals signs and nursing note reviewed.  Constitutional:      General: She is not in acute  distress.    Appearance: She is well-developed. She is not diaphoretic.  HENT:     Head: Normocephalic and atraumatic.  Eyes:     Pupils: Pupils are equal, round, and reactive to light.  Neck:     Musculoskeletal: Normal range of motion and neck supple.  Cardiovascular:     Rate and Rhythm: Normal rate and regular rhythm.     Pulses:          Radial pulses are 2+ on the right side and 2+ on the left side.       Dorsalis pedis pulses are 2+ on the right side and 2+ on the left side.     Heart sounds: No murmur. No friction rub. No gallop.   Pulmonary:     Effort: Pulmonary effort is normal.     Breath sounds: No wheezing or rales.  Abdominal:     General: There is no distension.     Palpations: Abdomen is soft.     Tenderness: There is no abdominal tenderness.  Musculoskeletal:        General: No tenderness.  Skin:    General: Skin is warm and dry.  Neurological:     Mental Status: She is alert and oriented to person, place, and time.  Psychiatric:        Behavior: Behavior normal.      ED Treatments / Results  Labs (all labs ordered are listed, but only abnormal results are displayed) Labs Reviewed  BASIC METABOLIC PANEL - Abnormal; Notable for the following components:      Result Value   Glucose, Bld 105 (*)    Calcium 8.7 (*)    GFR calc non Af Amer 58 (*)    All other components within normal limits  CBG MONITORING, ED - Abnormal; Notable for the following components:   Glucose-Capillary 101 (*)    All other components within normal limits  I-STAT TROPONIN, ED - Abnormal; Notable for the following components:   Troponin i, poc 0.11 (*)    All other components within normal limits  I-STAT TROPONIN, ED - Abnormal; Notable for the following components:   Troponin i, poc 0.18 (*)    All other components within normal limits  I-STAT TROPONIN, ED - Abnormal; Notable for the following components:  Troponin i, poc 0.16 (*)    All other components within normal  limits  CBC WITH DIFFERENTIAL/PLATELET  I-STAT TROPONIN, ED    EKG EKG Interpretation  Date/Time:  Monday June 17 2018 16:55:58 EST Ventricular Rate:  76 PR Interval:    QRS Duration: 84 QT Interval:  566 QTC Calculation: 637 R Axis:   22 Text Interpretation:  Sinus rhythm Low voltage, precordial leads Nonspecific T abnormalities, lateral leads Prolonged QT interval t wave flatenning t waves now upright in aVL Otherwise no significant change Confirmed by Deno Etienne [phone]) on 06/17/2018 5:41:34 PM   Radiology Dg Chest 2 View  Result Date: 06/17/2018 CLINICAL DATA:  Sudden onset palpitations and left-sided chest pain today. Recent myocardial infarct. EXAM: CHEST - 2 VIEW COMPARISON:  06/13/2018 FINDINGS: Normal heart size and pulmonary vascularity. No focal airspace disease or consolidation in the lungs. No blunting of costophrenic angles. No pneumothorax. Mediastinal contours appear intact. Coronary artery stent. IMPRESSION: No active cardiopulmonary disease. Electronically Signed   By: Lucienne Capers M.D.   On: 06/17/2018 19:14    Procedures Procedures (including critical care time)  Medications Ordered in ED Medications - No data to display   Initial Impression / Assessment and Plan / ED Course  I have reviewed the triage vital signs and the nursing notes.  Pertinent labs & imaging results that were available during my care of the patient were reviewed by me and considered in my medical decision making (see chart for details).     73 yo F with a chief complaint of chest pain.  This started suddenly about an hour and a half ago.  Patient felt that it was similar to her most recent MI.  She just had a cath 3 days ago and there was a stent placed in the first marginal.  Since then the patient has been weak but felt that her chest pain had resolved.  Initial EKG without concerning finding.  Will obtain a troponin discussed with cardiology.  I discussed the case with Dr.  Meda Coffee, cardiology she recommended that I repeat a troponin.  If this was improving or the same she felt that the patient be safe to be discharged home and recommended her follow-up in the office.  Barbera Setters has an appointment scheduled on Friday.  Repeat troponin had increased to 0.18.  I discussed the case with the cardiology fellow who is now on call, Dr. Marletta Lor.  She recommended performing a third troponin.  Third troponin is less than the second.  Again discussed with the cardiology fellow who felt that she was okay to be discharged at this time.  We will have her follow-up in the office.  10:41 PM:  I have discussed the diagnosis/risks/treatment options with the patient and family and believe the pt to be eligible for discharge home to follow-up with PCP. We also discussed returning to the ED immediately if new or worsening sx occur. We discussed the sx which are most concerning (e.g., sudden worsening pain, fever, inability to tolerate by mouth) that necessitate immediate return. Medications administered to the patient during their visit and any new prescriptions provided to the patient are listed below.  Medications given during this visit Medications - No data to display    The patient appears reasonably screen and/or stabilized for discharge and I doubt any other medical condition or other [facility] requiring further screening, evaluation, or treatment in the ED at this time prior to discharge.      Final Clinical Impressions(s) /  ED Diagnoses   Final diagnoses:  Nonspecific chest pain    ED Discharge Orders    None       Deno Etienne, DO 06/17/18 2241

## 2018-06-18 ENCOUNTER — Other Ambulatory Visit (HOSPITAL_COMMUNITY): Payer: Self-pay | Admitting: Nurse Practitioner

## 2018-06-18 ENCOUNTER — Telehealth: Payer: Self-pay | Admitting: Cardiovascular Disease

## 2018-06-18 ENCOUNTER — Encounter (HOSPITAL_COMMUNITY): Payer: Self-pay | Admitting: Nurse Practitioner

## 2018-06-18 ENCOUNTER — Encounter: Payer: Self-pay | Admitting: Cardiovascular Disease

## 2018-06-18 ENCOUNTER — Ambulatory Visit (INDEPENDENT_AMBULATORY_CARE_PROVIDER_SITE_OTHER): Payer: Medicare Other | Admitting: Cardiovascular Disease

## 2018-06-18 ENCOUNTER — Telehealth (HOSPITAL_COMMUNITY): Payer: Self-pay

## 2018-06-18 ENCOUNTER — Ambulatory Visit (HOSPITAL_COMMUNITY)
Admission: RE | Admit: 2018-06-18 | Discharge: 2018-06-18 | Disposition: A | Discharge status: Home / Self Care | Payer: Medicare Other | Source: Ambulatory Visit | Attending: Nurse Practitioner | Admitting: Nurse Practitioner

## 2018-06-18 VITALS — BP 150/100 | HR 64 | Ht 60.0 in | Wt 138.2 lb

## 2018-06-18 VITALS — BP 144/92 | HR 68 | Ht 60.0 in | Wt 138.0 lb

## 2018-06-18 DIAGNOSIS — Z79899 Other long term (current) drug therapy: Secondary | ICD-10-CM | POA: Diagnosis not present

## 2018-06-18 DIAGNOSIS — I25119 Atherosclerotic heart disease of native coronary artery with unspecified angina pectoris: Secondary | ICD-10-CM

## 2018-06-18 DIAGNOSIS — I252 Old myocardial infarction: Secondary | ICD-10-CM | POA: Insufficient documentation

## 2018-06-18 DIAGNOSIS — I480 Paroxysmal atrial fibrillation: Secondary | ICD-10-CM

## 2018-06-18 DIAGNOSIS — Z8249 Family history of ischemic heart disease and other diseases of the circulatory system: Secondary | ICD-10-CM | POA: Insufficient documentation

## 2018-06-18 DIAGNOSIS — G4733 Obstructive sleep apnea (adult) (pediatric): Secondary | ICD-10-CM | POA: Diagnosis not present

## 2018-06-18 DIAGNOSIS — E785 Hyperlipidemia, unspecified: Secondary | ICD-10-CM | POA: Diagnosis not present

## 2018-06-18 DIAGNOSIS — Z7901 Long term (current) use of anticoagulants: Secondary | ICD-10-CM | POA: Insufficient documentation

## 2018-06-18 DIAGNOSIS — K219 Gastro-esophageal reflux disease without esophagitis: Secondary | ICD-10-CM | POA: Insufficient documentation

## 2018-06-18 DIAGNOSIS — Z7982 Long term (current) use of aspirin: Secondary | ICD-10-CM | POA: Insufficient documentation

## 2018-06-18 DIAGNOSIS — I2510 Atherosclerotic heart disease of native coronary artery without angina pectoris: Secondary | ICD-10-CM | POA: Insufficient documentation

## 2018-06-18 DIAGNOSIS — I10 Essential (primary) hypertension: Secondary | ICD-10-CM | POA: Diagnosis not present

## 2018-06-18 DIAGNOSIS — Z9861 Coronary angioplasty status: Secondary | ICD-10-CM | POA: Insufficient documentation

## 2018-06-18 MED ORDER — LOSARTAN POTASSIUM 25 MG PO TABS: 50.0000 mg | ORAL_TABLET | Freq: Every day | ORAL | 11 refills | Status: DC

## 2018-06-18 MED ORDER — NITROGLYCERIN 0.4 MG SL SUBL: 0.4000 mg | SUBLINGUAL_TABLET | SUBLINGUAL | 6 refills | Status: DC | PRN

## 2018-06-18 MED ORDER — ISOSORBIDE MONONITRATE ER 30 MG PO TB24: 30.0000 mg | ORAL_TABLET | Freq: Every day | ORAL | 11 refills | Status: DC

## 2018-06-18 MED ORDER — AMIODARONE HCL 200 MG PO TABS: ORAL_TABLET | ORAL | 0 refills | Status: DC

## 2018-06-18 MED ORDER — PROPRANOLOL HCL 10 MG PO TABS: 10.0000 mg | ORAL_TABLET | Freq: Four times a day (QID) | ORAL | 11 refills | Status: DC | PRN

## 2018-06-18 NOTE — Telephone Encounter (Signed)
New Message   Pt is wanting to see an AFIB doctor.  She was advised to stop taking her AFIB and BP medication and she said she would get an appt scheduled this week with an AFIB doctor but she has an appt scheduled with a PA She says now that she stopped medication she has been having more problems.   Please call

## 2018-06-18 NOTE — Progress Notes (Signed)
Cardiology Office Note   Date:  06/18/2018   ID:  Shannon Cummings, DOB September 13, 1945, MRN [id number]  PCP:  [name], MD  Cardiologist:   [name], MD   Chief Complaint  Patient presents with  . Coronary Artery Disease  . Atrial Fibrillation   1. Coronary artery disease-status post stenting in the past. She status post stenting of her proximal LAD 08/14/2012 2. Intermittent atrial fibrillation 3.  hyperlipidemia-she has been generally intolerant to most statins   Pt is doing well. No cardiac complaints.   She complains of some tingling and numbness associated with the Crestor. She has discontinued the Crestor because of that reason. She has had reactions to most statins that we have tried.  October 08-2011  She has done well. She has not had any angina or eposides of atrial fib. She has retired and is traveling quite a bit. Her husband has also retired. She has not had any problems.  October 01, 2012:  She is having some bruising ( due to [place]). She is enjoying the cardiac rehab. She has noticed some low BP readings at the end of cardiac rehab. She does not feel bad. These low readings are typically asymptomatic.   No angina. No arrhythmias.   August 20, 2013:  Shannon Cummings is doing ok. She was admitted to the hospital with some CP recently. Rule out for MI.  She has been doing [place]. Has gained a bit of weight. She is not able to exercise because Of left ankle problems.    August 19, 2014:   Shannon Cummings is a 73 y.o. female who presents for follow up of her atrial fib. Has gained some weight.  She is in the study for lipid management Has had leg cramps since last fall. Off and on  Has had more paroxysmal atrial fib.  Lasts about a minute. Seem to be occuring more frequently.   Jan. 18, 2017:  Shannon Cummings presents for follow up of her CAD and atrial fib.  Doing well.   Has visited [place] recently .   No CP or dyspnea.   Tolerates the  Rythmol.  Talked about her daughter who lives in [place].  Is not having any significant atrial fib issues.   Has been on lipid lowering study drug at [facility]  ( injectable cholesterol medication )   Jan.   25, 2018:  Doing well.   Feeling well Has had a stomach bug last week and now has a URI  - not the flu.   Has maintained NSR .  Is having hair loss.  Her research shows that amlodipine and atenolol can cause this   Aug. 23, 2018:  Shannon Cummings is seen today for eval of some elevated BP Has been having pain in her back, tightness in her chest  Went to ER.  Has had elevated BP for a week,   Went back to ER  Doubled her metoprolol XL .  Saw Richardson Dopp on Aug. 6. 2018 Was given Imdur - developed a headache that lasted 3 days  Lexiscan myoview was low risk.   Her back pain / chest tightness has resolved.   May 08, 2017: Shannon Cummings seen today for follow-up of her hypertension, coronary artery disease, paroxysmal atrial fibrillation. Is feeling well  Is taking the Toprol XL just once a day - not BID as listed in MAR .   August 15, 2017   Shannon Cummings is doing well BP is much better.  Is exercising and watching  her diet.   Is on a study drug for cholesterol   June 18, 2018: He was admitted to the hospital recently with a non-ST segment elevation myocardial infarction. The previously placed LAD stent was widely patent.  She had a tight stenosis in her first obtuse marginal artery that was stented. Function is mildly reduced with an EF of 45 to 50%.  She returned to the ER the day after she went home with an episode of rapid AF and CP .    Troponin was  0.18  Her rhythmol has been stopped . Saw Roderic Palau and was started on Amiodarone . The plan is for short term Amio and then consider AF ablation . They also discussed Tikosyn .  She tolerates her AF poorly.  We discussed taking short acting beta blockers if she has AF with CP  Had been walking ,   Has been weak since  the MI.     Past Medical History:  Diagnosis Date  . Arthritis   . CAD (coronary artery disease)    a. prior stenting history. b. NSTEMI s/p DES to prox LAD with EF 45% by cath 08/14/12. c. Mild trop elevation shortly after cath ?mild plaque embolization - patent stent on relook. // d. Myoview 8/18: EF 60, normal perfusion; Low Risk. e. NSTEMI 06/2018- patent prior LAD stent, 80% OM1 s/p DES, normal LVEDP, EF 45-50%, moderate residual disease treated medically.  . Fatty infiltration of liver   . GERD (gastroesophageal reflux disease)   . Hyperlipidemia   . Hypertension   . Hypokalemia   . Hypotension   . Myocardial infarction ([place]) 2014  . OSA on CPAP   . Osteoporosis   . PAF (paroxysmal atrial fibrillation) ([place])    On rythmol previously  . Pulmonary nodule    a. 35mm subpleural nodular density CT 08/2012, instructed to f/u pulm MD.  . Sinus bradycardia   . Tubular adenoma of colon 2007    Past Surgical History:  Procedure Laterality Date  . CORONARY ANGIOPLASTY WITH STENT PLACEMENT  08/14/2012   LAD  Dr [name]  . CORONARY STENT INTERVENTION N/A 06/14/2018   Procedure: CORONARY STENT INTERVENTION;  Surgeon: Jettie Booze, MD;  Location: [facility];  Service: Cardiovascular;  Laterality: N/A;  . CORONARY STENT PLACEMENT  2000  . hair restoration  02/2017  . LEFT HEART CATH AND CORONARY ANGIOGRAPHY N/A 06/14/2018   Procedure: LEFT HEART CATH AND CORONARY ANGIOGRAPHY;  Surgeon: Jettie Booze, MD;  Location: [facility];  Service: Cardiovascular;  Laterality: N/A;  . LEFT HEART CATHETERIZATION WITH CORONARY ANGIOGRAM N/A 08/14/2012   Procedure: LEFT HEART CATHETERIZATION WITH CORONARY ANGIOGRAM;  Surgeon: [name], MD;  Location: [facility] CATH LAB;  Service: Cardiovascular;  Laterality: N/A;  . LEFT HEART CATHETERIZATION WITH CORONARY ANGIOGRAM N/A 08/19/2012   Procedure: LEFT HEART CATHETERIZATION WITH CORONARY ANGIOGRAM;  Surgeon: [name], MD;   Location: [facility] CATH LAB;  Service: Cardiovascular;  Laterality: N/A;  . ROTATOR CUFF REPAIR    . TONSILLECTOMY    . TOTAL ABDOMINAL HYSTERECTOMY       Current Outpatient Medications  Medication Sig Dispense Refill  . acetaminophen (TYLENOL) 325 MG tablet Take 325-650 mg by mouth every 8 (eight) hours as needed (for pain).     . AMBULATORY NON FORMULARY MEDICATION Take 180 mg by mouth daily. Medication Name: bempedoic acid 180 mg vs a placebo.  CLEAR Research Study, drug provided    . amiodarone (PACERONE)  200 MG tablet Take 1 tablet twice a day 60 tablet 0  . aspirin EC 81 MG tablet Take 1 tablet (81 mg total) by mouth daily. For 1 month total then stop - last dose 07/14/2018.    Marland Kitchen Cholecalciferol (VITAMIN D3) 25 MCG (1000 UT) CAPS Take 1,000 Units by mouth daily.    . clopidogrel (PLAVIX) 75 MG tablet Take 1 tablet (75 mg total) by mouth daily. 30 tablet 11  . Cyanocobalamin (B-12) 1000 MCG LOZG Take 1,000 mcg by mouth 2 (two) times daily.     Marland Kitchen ELIQUIS 5 MG TABS tablet TAKE 1 TABLET(5 MG) BY MOUTH TWICE DAILY (Patient taking differently: Take 5 mg by mouth 2 (two) times daily. ) 180 tablet 2  . fluticasone (FLONASE) 50 MCG/ACT nasal spray Place 2 sprays into both nostrils daily. (Patient taking differently: Place 2 sprays into both nostrils daily as needed for allergies or rhinitis. ) 16 g 6  . nitroGLYCERIN (NITROSTAT) 0.4 MG SL tablet Place 1 tablet (0.4 mg total) under the tongue every 5 (five) minutes as needed for chest pain (up to 3 doses). 25 tablet 6  . pantoprazole (PROTONIX) 40 MG tablet TAKE 1 TABLET(40 MG) BY MOUTH DAILY (Patient taking differently: Take 40 mg by mouth daily. ) 30 tablet 6  . potassium chloride (K-DUR) 10 MEQ tablet Take 1 tablet (10 mEq total) by mouth daily. 90 tablet 3  . temazepam (RESTORIL) 15 MG capsule Take 15 mg by mouth at bedtime as needed for sleep.    . isosorbide mononitrate (IMDUR) 30 MG 24 hr tablet Take 1 tablet (30 mg total) by mouth daily. 30 tablet  11  . losartan (COZAAR) 25 MG tablet Take 2 tablets (50 mg total) by mouth daily. 60 tablet 11  . propranolol (INDERAL) 10 MG tablet Take 1 tablet (10 mg total) by mouth 4 (four) times daily as needed. 60 tablet 11   No current facility-administered medications for this visit.     Allergies:   Other; Effexor [venlafaxine]; Statins; and Zetia [ezetimibe]    Social History:  The patient  reports that she has never smoked. She has never used smokeless tobacco. She reports current alcohol use. She reports that she does not use drugs.   Family History:  The patient's family history includes Alzheimer's disease in her mother; Breast cancer in her maternal aunt; Heart disease in her father.    ROS: Noted in current history.  All other systems are negative.  Physical Exam: Blood pressure (!) 150/100, pulse 64, height 5' (1.524 m), weight 138 lb 3.2 oz (62.7 kg), SpO2 99 %.  GEN:  Well nourished, well developed in no acute distress HEENT: Normal NECK: No JVD; No carotid bruits LYMPHATICS: No lymphadenopathy CARDIAC: RRR  RESPIRATORY:  Clear to auscultation without rales, wheezing or rhonchi  ABDOMEN: Soft, non-tender, non-distended MUSCULOSKELETAL:  No edema; No deformity  SKIN: Warm and dry NEUROLOGIC:  Alert and oriented x 3   EKG:   .    Recent Labs: 04/30/2018: ALT 26; TSH 3.49 06/17/2018: BUN 10; Creatinine, Ser 0.98; Hemoglobin 14.4; Platelets 194; Potassium 3.6; Sodium 138    Lipid Panel    Component Value Date/Time   CHOL 256 (H) 06/23/2015 1153   TRIG 215 (H) 06/23/2015 1153   HDL 44 (L) 06/23/2015 1153   CHOLHDL 5.8 (H) 06/23/2015 1153   VLDL 43 (H) 06/23/2015 1153   LDLCALC 169 (H) 06/23/2015 1153   LDLDIRECT 174.0 12/31/2012 0754  Wt Readings from Last 3 Encounters:  06/18/18 138 lb 3.2 oz (62.7 kg)  06/18/18 138 lb (62.6 kg)  06/17/18 138 lb 14.2 oz (63 kg)      Other studies Reviewed: Additional studies/ records that were reviewed today include:  . Review of the above records demonstrates:    ASSESSMENT AND PLAN:  1. Coronary artery disease-  S/p stenting of her OM.   Her old LAD stent from 20 years ago is widely patent. Add Imdur 30  .   2. Intermittent atrial fibrillation -    .Rhythmol has been stopped.  Is now  on Amio 200 BID which is a temporary medication.  She will be seeing Roderic Palau, NP next week and the plan is to potentially decrease the amiodarone to 200 mg once a day. HR is currently 64.   Plan is  for A. fib ablation in 6 to 8 weeks.  3.  hyperlipidemia-     4. Essential HTN:     -  BP remains elevated.  Increase losartan to 50 mg a day.  We will also add isosorbide mononitrate 30 mg a day which should help somewhat with her blood pressure.     Current medicines are reviewed at length with the patient today.  The patient does not have concerns regarding medicines.  The following changes have been made:  no change  Labs/ tests ordered today include:   No orders of the defined types were placed in this encounter.   Signed, [name], MD  06/18/2018 6:02 PM    [facility] [address], [place], [place]  [address] Phone: [phone]; Fax: [phone]

## 2018-06-18 NOTE — Telephone Encounter (Signed)
Pt insurance is active and benefits verified through Medicare A/B. Co-pay $0.00, DED $198.00/$0.00 met, out of pocket $0.00/$0.00 met, co-insurance 20%. No pre-authorization required. Passport, 06/18/2018 @ 9:16AM, REF# [id number]  2ndary insurance is active and benefits verified through [place]. Co-pay $0.00, DED $0.00/$0.00 met, out of pocket $0.00/$0.00 met, co-insurance 0%. No pre-authorization required. Passport, 06/18/2018 @ 9:19AM, REF# [id number]  Will contact patient to see if she is interested in the Cardiac Rehab Program. If interested, patient will need to complete follow up appt. Once completed, patient will be contacted for scheduling upon review by the RN Navigator.

## 2018-06-18 NOTE — Telephone Encounter (Signed)
Returned call to patient who states she called with concern about appointment and being taken off Rythmol when discharged from [facility] on 1/11. She states she went back to ED last night with chest pain due to discomfort associated with a fib. She called today to request an appointment with Dr. Acie Fredrickson and was told she has to see a PA. She just received a call from [facility] and is scheduled to see Roderic Palau, NP today at 2:30. Dr. Acie Fredrickson advised that she come see him after that appointment. Patient is scheduled for today at 3:40. She thanked me for the call.

## 2018-06-18 NOTE — Progress Notes (Addendum)
Primary Care Physician: [name], MD Referring Physician: Melina Copa, PA Cardiologist: Dr. Sheria Lang is a 73 y.o. female with a h/o CAD, paroxysmal afib that is in the afib clinic for f/u of propafenone being stopped 2/2 to chest pain with NSTEMI  and receiving a new stent(3rd total) 1/10, EF 40-45%. Metoprolol was also stopped for brady, soft BP's. She went back into afib yesterday with chest pain and returned to the ER, she had returned to SR and chest pain was relieved upon arrival, negative troponin x 2 and she was discharged home.   Shannon Cummings had sent Dr. Rayann Heman a Epic  message re f/u in the afib clinic  and  appropriate antiarrythmic's to use with pt as she is quite symptomatic in AF.   This was his response- "Outpatient options are limited. Cant use Ics with CAD. I probably wouldn't use multaq with her EF.  AAD options are sotalol, tikosyn, or amiodarone.   Another option would be to use amiodarone until she recovers from her recent hospitalization/ NSTEMI and then proceed with ablation in a couple months."   The pt is in [place]  today with a HR of 68 bpm. I discussed the above options and she would like the quickest drug to get started as she is planning to go to [facility] this week end and is terrified of another afib spell. She is very interested in short term amiodarone as a bridge to ablation in a couple of months to allow recovery form her NSTEMI.  Today, she denies symptoms of palpitations, chest pain, shortness of breath, orthopnea, PND, lower extremity edema, dizziness, presyncope, syncope, or neurologic sequela. The patient is tolerating medications without difficulties and is otherwise without complaint today.   Past Medical History:  Diagnosis Date  . Arthritis   . CAD (coronary artery disease)    a. prior stenting history. b. NSTEMI s/p DES to prox LAD with EF 45% by cath 08/14/12. c. Mild trop elevation shortly after cath ?mild plaque  embolization - patent stent on relook. // d. Myoview 8/18: EF 60, normal perfusion; Low Risk. e. NSTEMI 06/2018- patent prior LAD stent, 80% OM1 s/p DES, normal LVEDP, EF 45-50%, moderate residual disease treated medically.  . Fatty infiltration of liver   . GERD (gastroesophageal reflux disease)   . Hyperlipidemia   . Hypertension   . Hypokalemia   . Hypotension   . Myocardial infarction ([place]) 2014  . OSA on CPAP   . Osteoporosis   . PAF (paroxysmal atrial fibrillation) ([place])    On rythmol previously  . Pulmonary nodule    a. 1mm subpleural nodular density CT 08/2012, instructed to f/u pulm MD.  . Sinus bradycardia   . Tubular adenoma of colon 2007   Past Surgical History:  Procedure Laterality Date  . CORONARY ANGIOPLASTY WITH STENT PLACEMENT  08/14/2012   LAD  Dr [name]  . CORONARY STENT INTERVENTION N/A 06/14/2018   Procedure: CORONARY STENT INTERVENTION;  Surgeon: Jettie Booze, MD;  Location: [facility];  Service: Cardiovascular;  Laterality: N/A;  . CORONARY STENT PLACEMENT  2000  . hair restoration  02/2017  . LEFT HEART CATH AND CORONARY ANGIOGRAPHY N/A 06/14/2018   Procedure: LEFT HEART CATH AND CORONARY ANGIOGRAPHY;  Surgeon: Jettie Booze, MD;  Location: [facility];  Service: Cardiovascular;  Laterality: N/A;  . LEFT HEART CATHETERIZATION WITH CORONARY ANGIOGRAM N/A 08/14/2012   Procedure: LEFT HEART CATHETERIZATION WITH CORONARY ANGIOGRAM;  Surgeon: [name], MD;  Location: [facility] CATH LAB;  Service: Cardiovascular;  Laterality: N/A;  . LEFT HEART CATHETERIZATION WITH CORONARY ANGIOGRAM N/A 08/19/2012   Procedure: LEFT HEART CATHETERIZATION WITH CORONARY ANGIOGRAM;  Surgeon: [name], MD;  Location: [facility] CATH LAB;  Service: Cardiovascular;  Laterality: N/A;  . ROTATOR CUFF REPAIR    . TONSILLECTOMY    . TOTAL ABDOMINAL HYSTERECTOMY      Current Outpatient Medications  Medication Sig Dispense Refill  . acetaminophen (TYLENOL) 325  MG tablet Take 325-650 mg by mouth every 8 (eight) hours as needed (for pain).     . AMBULATORY NON FORMULARY MEDICATION Take 180 mg by mouth daily. Medication Name: bempedoic acid 180 mg vs a placebo.  CLEAR Research Study, drug provided    . aspirin EC 81 MG tablet Take 1 tablet (81 mg total) by mouth daily. For 1 month total then stop - last dose 07/14/2018.    Marland Kitchen Cholecalciferol (VITAMIN D3) 25 MCG (1000 UT) CAPS Take 1,000 Units by mouth daily.    . clopidogrel (PLAVIX) 75 MG tablet Take 1 tablet (75 mg total) by mouth daily. 30 tablet 11  . Cyanocobalamin (B-12) 1000 MCG LOZG Take 1,000 mcg by mouth 2 (two) times daily.     Marland Kitchen ELIQUIS 5 MG TABS tablet TAKE 1 TABLET(5 MG) BY MOUTH TWICE DAILY (Patient taking differently: Take 5 mg by mouth 2 (two) times daily. ) 180 tablet 2  . fluticasone (FLONASE) 50 MCG/ACT nasal spray Place 2 sprays into both nostrils daily. (Patient taking differently: Place 2 sprays into both nostrils daily as needed for allergies or rhinitis. ) 16 g 6  . losartan (COZAAR) 25 MG tablet Take 1 tablet (25 mg total) by mouth daily. 90 tablet 3  . pantoprazole (PROTONIX) 40 MG tablet TAKE 1 TABLET(40 MG) BY MOUTH DAILY (Patient taking differently: Take 40 mg by mouth daily. ) 30 tablet 6  . potassium chloride (K-DUR) 10 MEQ tablet Take 1 tablet (10 mEq total) by mouth daily. 90 tablet 3  . temazepam (RESTORIL) 15 MG capsule Take 15 mg by mouth at bedtime as needed for sleep.    Marland Kitchen amiodarone (PACERONE) 200 MG tablet Take 1 tablet twice a day 60 tablet 0  . nitroGLYCERIN (NITROSTAT) 0.4 MG SL tablet Place 1 tablet (0.4 mg total) under the tongue every 5 (five) minutes as needed for chest pain (up to 3 doses). (Patient not taking: Reported on 06/18/2018) 25 tablet 6   No current facility-administered medications for this encounter.     Allergies  Allergen Reactions  . Other Anxiety and Other (See Comments)    Intolerance to strong pain medications=  Make her feel anxious and feel  like coming out of her skin  . Effexor [Venlafaxine] Other (See Comments)    CAUSED TOO MUCH SEDATION; CANNOT TOLERATE  . Statins Other (See Comments)    Restless legs, bad feeling.  This has occurred with Crestor 5 mg once weekly, Lipitor 10 mg twice weekly, simvastatin daily  . Zetia [Ezetimibe]     Leg aches    Social History   Socioeconomic History  . Marital status: Married    Spouse name: Not on file  . Number of children: 2  . Years of education: Not on file  . Highest education level: Not on file  Occupational History  . 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  Financial resource strain: Not on file  . Food insecurity:    Worry: Not on file    Inability: Not on file  . Transportation needs:    Medical: Not on file    Non-medical: Not on file  Tobacco Use  . Smoking status: Never Smoker  . Smokeless tobacco: Never Used  Substance and Sexual Activity  . Alcohol use: Yes    Comment: 2 drinks daily   . Drug use: No  . Sexual activity: Not Currently  Lifestyle  . Physical activity:    Days per week: Not on file    Minutes per session: Not on file  . Stress: Not on file  Relationships  . Social connections:    Talks on phone: Not on file    Gets together: Not on file    Attends religious service: Not on file    Active member of club or organization: Not on file    Attends meetings of clubs or organizations: Not on file    Relationship status: Not on file  . Intimate partner violence:    Fear of current or ex partner: Not on file    Emotionally abused: Not on file    Physically abused: Not on file    Forced sexual activity: Not on file  Other Topics Concern  . Not on file  Social History Narrative   Daily caffeine     Family History  Problem Relation Age of Onset  . Heart disease Father   . Alzheimer's disease Mother   . Breast cancer Maternal Aunt   . Colon cancer Neg Hx      ROS- All systems are reviewed and negative except as per the HPI above  Physical Exam: Vitals:   06/18/18 1431  BP: (!) 144/92  Pulse: 68  Weight: 62.6 kg  Height: 5' (1.524 m)   Wt Readings from Last 3 Encounters:  06/18/18 62.6 kg  06/17/18 63 kg  06/15/18 63.1 kg    Labs: Lab Results  Component Value Date   NA 138 06/17/2018   K 3.6 06/17/2018   CL 104 06/17/2018   CO2 23 06/17/2018   GLUCOSE 105 (H) 06/17/2018   BUN 10 06/17/2018   CREATININE 0.98 06/17/2018   CALCIUM 8.7 (L) 06/17/2018   MG 2.0 08/17/2012   Lab Results  Component Value Date   INR 1.13 06/13/2018   Lab Results  Component Value Date   CHOL 256 (H) 06/23/2015   HDL 44 (L) 06/23/2015   LDLCALC 169 (H) 06/23/2015   TRIG 215 (H) 06/23/2015     GEN- The patient is well appearing, alert and oriented x 3 today.   Head- normocephalic, atraumatic Eyes-  Sclera clear, conjunctiva pink Ears- hearing intact Oropharynx- clear Neck- supple, no JVP Lymph- no cervical lymphadenopathy Lungs- Clear to ausculation bilaterally, normal work of breathing Heart- Regular rate and rhythm, no murmurs, rubs or gallops, PMI not laterally displaced GI- soft, NT, ND, + BS Extremities- no clubbing, cyanosis, or edema MS- no significant deformity or atrophy Skin- no rash or lesion Psych- euthymic mood, full affect Neuro- strength and sensation are intact  EKG-NSR at 68 bpm, pr int 176 ms, qrs int 74 ms, qtc 404 ms Epic records reviewed  LHC-Previously placed Prox LAD stent is widely patent.  RPDA lesion is 50% stenosed.  Ost 1st Mrg lesion is 25% stenosed.  1st Mrg lesion is 80% stenosed.  A drug-eluting stent was successfully placed using a STENT SYNERGY DES 2.5X12.  Post intervention, there is a 0% residual stenosis.  Ost 1st Diag lesion is 25% stenosed.  The left ventricular systolic function is normal.  LV end diastolic pressure is normal. LVEDP 11 mm Hg.  The left ventricular ejection  fraction is 45-50% by visual estimate.  There is no aortic valve stenosis. Echo- Study Conclusions  - Left ventricle: The cavity size was normal. Systolic function was   mildly to moderately reduced. The estimated ejection fraction was   in the range of 40% to 45%. Hypokinesis of the   basal-midanterolateral and inferolateral myocardium; consistent   with ischemia or infarction in the distribution of the left   circumflex coronary artery. Doppler parameters are consistent   with abnormal left ventricular relaxation (grade 1 diastolic   dysfunction). Doppler parameters are consistent with elevated   mean left atrial filling pressure. - Pericardium, extracardiac: There was a left pleural effusion.    Assessment and Plan: 1. Paroxsymal  afib Pt very symptomatic with afib episodes Discussed options that Dr. Rayann Heman had suggested in HPI Pt is interested to start  Amiodarone as a bridge to ablation after time for recovery from stemi. Will start amiodarone 200 mg bid for one week and then see her back for EKG, will decide at that time to continue that dose for another week or two or reduce to 200 mg daily   Her BB in on hold and will continue to hold as amiodarone may cause some bradycardia We discussed that she could use 1/2 to 1 tab of 25 mg metoprolol for breakthrough afib TSH/ liver panel in November 2019 wnl, recent cxr wnl  2. CHA2DS2VASc score of 5 Now on triple therapy Bleeding precautions discussed  3. CAD No current anginal c/o's Has f/u with Dr. Acie Fredrickson this pm  F/u here in one week

## 2018-06-18 NOTE — Patient Instructions (Addendum)
Medication Instructions:  Your physician has recommended you make the following change in your medication:  INCREASE Losartan (Cozaar) to 50 mg once daily TAKE Propranolol (Inderal) 10 mg up to 4 times per day as needed for a fib/chest fluttering START Imdur (Isosorbide) 30 mg once daily   If you need a refill on your cardiac medications before your next appointment, please call your pharmacy.   Lab work: None Ordered    Testing/Procedures: None Ordered    Follow-Up: Your physician recommends that you return for a follow-up appointment with Dr. Acie Fredrickson on February 26

## 2018-06-18 NOTE — Patient Instructions (Signed)
Start amiodarone to 200mg  twice a day until follow up

## 2018-06-21 ENCOUNTER — Ambulatory Visit: Payer: Medicare Other | Admitting: Physician Assistant

## 2018-06-23 ENCOUNTER — Telehealth (HOSPITAL_COMMUNITY): Payer: Self-pay | Admitting: *Deleted

## 2018-06-23 NOTE — Telephone Encounter (Signed)
-----   Message from Thayer Headings, MD sent at 06/21/2018  5:24 PM EST ----- Regarding: RE: Cardiac Rehab Yes,  Please continue with cardiac rehab  Thanks  ----- Message ----- From: Rowe Pavy, RN Sent: 06/21/2018   3:46 PM EST To: Thayer Headings, MD Subject: Cardiac Rehab                                  Dr. Acie Fredrickson,  The above pt referred to cardiac rehab s/p 06/13/18 NSTEMI and DES  1st Marginal seen in follow up today by you.  Noted in office note, plan to have afib ablation in 6-8 weeks. May pt proceed with scheduling for Cardiac Rehab? (note we are scheduling for the week of February 17th.)  Your thoughts Cherre Huger, BSN Cardiac and Training and development officer

## 2018-06-24 ENCOUNTER — Telehealth: Payer: Self-pay | Admitting: Nurse Practitioner

## 2018-06-24 NOTE — Telephone Encounter (Signed)
Received call from [facility] who state patient is there for dental cleaning and they are wondering if she needs pre-medication for cleaning due to cardiac stenting on 1/10. I advised her that cleaning appointment should be scheduled for 2/10 or later and that no antibiotic will be needed at that time. Caller verbalized understanding and agreement and thanked me for my help.

## 2018-06-26 ENCOUNTER — Ambulatory Visit (HOSPITAL_COMMUNITY)
Admission: RE | Admit: 2018-06-26 | Discharge: 2018-06-26 | Disposition: A | Discharge status: Home / Self Care | Payer: Medicare Other | Source: Ambulatory Visit | Attending: Nurse Practitioner | Admitting: Nurse Practitioner

## 2018-06-26 ENCOUNTER — Encounter (HOSPITAL_COMMUNITY): Payer: Self-pay | Admitting: Nurse Practitioner

## 2018-06-26 VITALS — BP 164/94 | HR 53 | Ht 60.0 in | Wt 139.0 lb

## 2018-06-26 DIAGNOSIS — I4819 Other persistent atrial fibrillation: Secondary | ICD-10-CM | POA: Insufficient documentation

## 2018-06-26 DIAGNOSIS — I480 Paroxysmal atrial fibrillation: Secondary | ICD-10-CM | POA: Diagnosis not present

## 2018-06-26 NOTE — Addendum Note (Signed)
Encounter addended by: Sherran Needs, NP on: 06/26/2018 9:23 AM  Actions taken: Clinical Note Signed

## 2018-06-26 NOTE — Telephone Encounter (Signed)
Pt returned CR phone call and stated she will like to participate, pt will come in for orientation on 08/01/2018 @ 830AM and will attend the 11:15AM class. Went over insurance, patient verbalized understanding.   Mailed homework package

## 2018-06-26 NOTE — Progress Notes (Signed)
Primary Care Physician: [name], MD Referring Physician: Melina Copa, PA Cardiologist: Dr. Sheria Lang is a 73 y.o. female with a h/o CAD, paroxysmal afib that is in the afib clinic for f/u of propafenone being stopped 2/2 to chest pain with NSTEMI  and receiving a new stent(3rd total) 1/10, EF 40-45%. Metoprolol was also stopped for brady, soft BP's. She went back into afib yesterday with chest pain and returned to the ER, she had returned to SR and chest pain was relieved upon arrival, negative troponin x 2 and she was discharged home.   Eugenia Mcalpine had sent Dr. Rayann Heman a Epic  message re f/u in the afib clinic  and  appropriate antiarrythmic's to use with pt as she is quite symptomatic in AF.   This was his response- "Outpatient options are limited. Cant use Ics with CAD. I probably wouldn't use multaq with her EF.  AAD options are sotalol, tikosyn, or amiodarone.   Another option would be to use amiodarone until she recovers from her recent hospitalization/ NSTEMI and then proceed with ablation in a couple months."   The pt is in [place]  today with a HR of 68 bpm. I discussed the above options and she would like the quickest drug to get started as she is planning to go to [facility] this week end and is terrified of another afib spell. She is very interested in short term amiodarone as a bridge to ablation in a couple of months to allow recovery form her NSTEMI.  F/u in afib clinic, 1/22. She is tolerating amiodarone load and reports that she did have some additional afib last week. However, when she saw Dr. Acie Fredrickson, he gave her prn propanolol and this had restored SR usually within the hour of an episode.    Today, she denies symptoms of palpitations, chest pain, shortness of breath, orthopnea, PND, lower extremity edema, dizziness, presyncope, syncope, or neurologic sequela. The patient is tolerating medications without difficulties and is otherwise without complaint  today.   Past Medical History:  Diagnosis Date  . Arthritis   . CAD (coronary artery disease)    a. prior stenting history. b. NSTEMI s/p DES to prox LAD with EF 45% by cath 08/14/12. c. Mild trop elevation shortly after cath ?mild plaque embolization - patent stent on relook. // d. Myoview 8/18: EF 60, normal perfusion; Low Risk. e. NSTEMI 06/2018- patent prior LAD stent, 80% OM1 s/p DES, normal LVEDP, EF 45-50%, moderate residual disease treated medically.  . Fatty infiltration of liver   . GERD (gastroesophageal reflux disease)   . Hyperlipidemia   . Hypertension   . Hypokalemia   . Hypotension   . Myocardial infarction ([place]) 2014  . OSA on CPAP   . Osteoporosis   . PAF (paroxysmal atrial fibrillation) ([place])    On rythmol previously  . Pulmonary nodule    a. 35mm subpleural nodular density CT 08/2012, instructed to f/u pulm MD.  . Sinus bradycardia   . Tubular adenoma of colon 2007   Past Surgical History:  Procedure Laterality Date  . CORONARY ANGIOPLASTY WITH STENT PLACEMENT  08/14/2012   LAD  Dr [name]  . CORONARY STENT INTERVENTION N/A 06/14/2018   Procedure: CORONARY STENT INTERVENTION;  Surgeon: Jettie Booze, MD;  Location: [place] [facility];  Service: Cardiovascular;  Laterality: N/A;  . CORONARY STENT PLACEMENT  2000  . hair restoration  02/2017  . LEFT HEART CATH AND CORONARY ANGIOGRAPHY N/A  06/14/2018   Procedure: LEFT HEART CATH AND CORONARY ANGIOGRAPHY;  Surgeon: Jettie Booze, MD;  Location: [facility];  Service: Cardiovascular;  Laterality: N/A;  . LEFT HEART CATHETERIZATION WITH CORONARY ANGIOGRAM N/A 08/14/2012   Procedure: LEFT HEART CATHETERIZATION WITH CORONARY ANGIOGRAM;  Surgeon: [name], MD;  Location: [facility];  Service: Cardiovascular;  Laterality: N/A;  . LEFT HEART CATHETERIZATION WITH CORONARY ANGIOGRAM N/A 08/19/2012   Procedure: LEFT HEART CATHETERIZATION WITH CORONARY ANGIOGRAM;  Surgeon: [name], MD;   Location: [facility] CATH LAB;  Service: Cardiovascular;  Laterality: N/A;  . ROTATOR CUFF REPAIR    . TONSILLECTOMY    . TOTAL ABDOMINAL HYSTERECTOMY      Current Outpatient Medications  Medication Sig Dispense Refill  . acetaminophen (TYLENOL) 325 MG tablet Take 325-650 mg by mouth every 8 (eight) hours as needed (for pain).     . AMBULATORY NON FORMULARY MEDICATION Take 180 mg by mouth daily. Medication Name: bempedoic acid 180 mg vs a placebo.  CLEAR Research Study, drug provided    . amiodarone (PACERONE) 200 MG tablet TAKE 1 TABLET BY MOUTH TWICE DAILY 60 tablet 0  . aspirin EC 81 MG tablet Take 1 tablet (81 mg total) by mouth daily. For 1 month total then stop - last dose 07/14/2018.    Marland Kitchen Cholecalciferol (VITAMIN D3) 25 MCG (1000 UT) CAPS Take 1,000 Units by mouth daily.    . clopidogrel (PLAVIX) 75 MG tablet Take 1 tablet (75 mg total) by mouth daily. 30 tablet 11  . Cyanocobalamin (B-12) 1000 MCG LOZG Take 1,000 mcg by mouth 2 (two) times daily.     Marland Kitchen ELIQUIS 5 MG TABS tablet TAKE 1 TABLET(5 MG) BY MOUTH TWICE DAILY (Patient taking differently: Take 5 mg by mouth 2 (two) times daily. ) 180 tablet 2  . fluticasone (FLONASE) 50 MCG/ACT nasal spray Place 2 sprays into both nostrils daily. (Patient taking differently: Place 2 sprays into both nostrils daily as needed for allergies or rhinitis. ) 16 g 6  . losartan (COZAAR) 25 MG tablet Take 2 tablets (50 mg total) by mouth daily. 60 tablet 11  . pantoprazole (PROTONIX) 40 MG tablet TAKE 1 TABLET(40 MG) BY MOUTH DAILY (Patient taking differently: Take 40 mg by mouth daily. ) 30 tablet 6  . potassium chloride (K-DUR) 10 MEQ tablet Take 1 tablet (10 mEq total) by mouth daily. 90 tablet 3  . propranolol (INDERAL) 10 MG tablet Take 1 tablet (10 mg total) by mouth 4 (four) times daily as needed. 60 tablet 11  . temazepam (RESTORIL) 15 MG capsule Take 15 mg by mouth at bedtime as needed for sleep.    . isosorbide mononitrate (IMDUR) 30 MG 24 hr tablet Take  1 tablet (30 mg total) by mouth daily. (Patient not taking: Reported on 06/26/2018) 30 tablet 11  . nitroGLYCERIN (NITROSTAT) 0.4 MG SL tablet Place 1 tablet (0.4 mg total) under the tongue every 5 (five) minutes as needed for chest pain (up to 3 doses). (Patient not taking: Reported on 06/26/2018) 25 tablet 6   No current facility-administered medications for this encounter.     Allergies  Allergen Reactions  . Other Anxiety and Other (See Comments)    Intolerance to strong pain medications=  Make her feel anxious and feel like coming out of her skin  . Effexor [Venlafaxine] Other (See Comments)    CAUSED TOO MUCH SEDATION; CANNOT TOLERATE  . Statins Other (See Comments)    Restless legs, bad  feeling.  This has occurred with Crestor 5 mg once weekly, Lipitor 10 mg twice weekly, simvastatin daily  . Zetia [Ezetimibe]     Leg aches    Social History   Socioeconomic History  . Marital status: Married    Spouse name: Not on file  . Number of children: 2  . Years of education: Not on file  . Highest education level: Not on file  Occupational History  . 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 Financial resource strain: Not on file  . Food insecurity:    Worry: Not on file    Inability: Not on file  . Transportation needs:    Medical: Not on file    Non-medical: Not on file  Tobacco Use  . Smoking status: Never Smoker  . Smokeless tobacco: Never Used  Substance and Sexual Activity  . Alcohol use: Yes    Comment: 2 drinks daily   . Drug use: No  . Sexual activity: Not Currently  Lifestyle  . Physical activity:    Days per week: Not on file    Minutes per session: Not on file  . Stress: Not on file  Relationships  . Social connections:    Talks on phone: Not on file    Gets together: Not on file    Attends religious service: Not on file    Active member of club or organization: Not on file    Attends  meetings of clubs or organizations: Not on file    Relationship status: Not on file  . Intimate partner violence:    Fear of current or ex partner: Not on file    Emotionally abused: Not on file    Physically abused: Not on file    Forced sexual activity: Not on file  Other Topics Concern  . Not on file  Social History Narrative   Daily caffeine     Family History  Problem Relation Age of Onset  . Heart disease Father   . Alzheimer's disease Mother   . Breast cancer Maternal Aunt   . Colon cancer Neg Hx     ROS- All systems are reviewed and negative except as per the HPI above  Physical Exam: Vitals:   06/26/18 1146  BP: (!) 164/94  Pulse: (!) 53  Weight: 63 kg  Height: 5' (1.524 m)   Wt Readings from Last 3 Encounters:  06/26/18 63 kg  06/18/18 62.6 kg  06/18/18 62.7 kg    Labs: Lab Results  Component Value Date   NA 138 06/17/2018   K 3.6 06/17/2018   CL 104 06/17/2018   CO2 23 06/17/2018   GLUCOSE 105 (H) 06/17/2018   BUN 10 06/17/2018   CREATININE 0.98 06/17/2018   CALCIUM 8.7 (L) 06/17/2018   MG 2.0 08/17/2012   Lab Results  Component Value Date   INR 1.13 06/13/2018   Lab Results  Component Value Date   CHOL 256 (H) 06/23/2015   HDL 44 (L) 06/23/2015   LDLCALC 169 (H) 06/23/2015   TRIG 215 (H) 06/23/2015     GEN- The patient is well appearing, alert and oriented x 3 today.   Head- normocephalic, atraumatic Eyes-  Sclera clear, conjunctiva pink Ears- hearing intact Oropharynx- clear Neck- supple, no JVP Lymph- no cervical lymphadenopathy Lungs- Clear to ausculation bilaterally, normal work of breathing Heart- Regular rate and rhythm, no murmurs, rubs  or gallops, PMI not laterally displaced GI- soft, NT, ND, + BS Extremities- no clubbing, cyanosis, or edema MS- no significant deformity or atrophy Skin- no rash or lesion Psych- euthymic mood, full affect Neuro- strength and sensation are intact  EKG-NSR at 53 bpm, pr int 196 ms, qrs  int 86 ms, qtc 422 ms Epic records reviewed  LHC-Previously placed Prox LAD stent is widely patent.  RPDA lesion is 50% stenosed.  Ost 1st Mrg lesion is 25% stenosed.  1st Mrg lesion is 80% stenosed.  A drug-eluting stent was successfully placed using a STENT SYNERGY DES 2.5X12.  Post intervention, there is a 0% residual stenosis.  Ost 1st Diag lesion is 25% stenosed.  The left ventricular systolic function is normal.  LV end diastolic pressure is normal. LVEDP 11 mm Hg.  The left ventricular ejection fraction is 45-50% by visual estimate.  There is no aortic valve stenosis. Echo- Study Conclusions  - Left ventricle: The cavity size was normal. Systolic function was   mildly to moderately reduced. The estimated ejection fraction was   in the range of 40% to 45%. Hypokinesis of the   basal-midanterolateral and inferolateral myocardium; consistent   with ischemia or infarction in the distribution of the left   circumflex coronary artery. Doppler parameters are consistent   with abnormal left ventricular relaxation (grade 1 diastolic   dysfunction). Doppler parameters are consistent with elevated   mean left atrial filling pressure. - Pericardium, extracardiac: There was a left pleural effusion.    Assessment and Plan: 1. Paroxsymal  afib Pt very symptomatic with afib episodes Pt wanted to use amiodarone as a short term bridge to ablation Continue loading of  amiodarone at 200 mg bid since she has had additional afib episodes.  Continue to use prn propanolol as needed for afib epiosdes TSH/ liver panel in November 2019 wnl, recent cxr wnl  2. CHA2DS2VASc score of 5 Now on triple therapy Bleeding precautions discussed  3. CAD No current anginal c/o's Per Dr. Acie Fredrickson   F/u here in 3 weeks Dr. Rayann Heman in 2 months  Geroge Baseman. Taiwo Fish, [facility] [address] [place], [place] [address] [phone]

## 2018-06-26 NOTE — Telephone Encounter (Signed)
Attempted to call patient in regards to Cardiac Rehab - LM on VM 

## 2018-07-08 ENCOUNTER — Other Ambulatory Visit: Payer: Self-pay | Admitting: Cardiovascular Disease

## 2018-07-15 ENCOUNTER — Encounter (HOSPITAL_COMMUNITY): Payer: Self-pay | Admitting: Nurse Practitioner

## 2018-07-15 ENCOUNTER — Ambulatory Visit (HOSPITAL_COMMUNITY)
Admission: RE | Admit: 2018-07-15 | Discharge: 2018-07-15 | Disposition: A | Discharge status: Home / Self Care | Payer: Medicare Other | Source: Ambulatory Visit | Attending: Nurse Practitioner | Admitting: Nurse Practitioner

## 2018-07-15 VITALS — BP 142/86 | HR 58 | Ht 60.0 in | Wt 139.0 lb

## 2018-07-15 DIAGNOSIS — Z8249 Family history of ischemic heart disease and other diseases of the circulatory system: Secondary | ICD-10-CM | POA: Insufficient documentation

## 2018-07-15 DIAGNOSIS — I2510 Atherosclerotic heart disease of native coronary artery without angina pectoris: Secondary | ICD-10-CM | POA: Diagnosis not present

## 2018-07-15 DIAGNOSIS — M1990 Unspecified osteoarthritis, unspecified site: Secondary | ICD-10-CM | POA: Insufficient documentation

## 2018-07-15 DIAGNOSIS — I252 Old myocardial infarction: Secondary | ICD-10-CM | POA: Diagnosis not present

## 2018-07-15 DIAGNOSIS — Z7902 Long term (current) use of antithrombotics/antiplatelets: Secondary | ICD-10-CM | POA: Insufficient documentation

## 2018-07-15 DIAGNOSIS — I10 Essential (primary) hypertension: Secondary | ICD-10-CM | POA: Diagnosis not present

## 2018-07-15 DIAGNOSIS — Z7901 Long term (current) use of anticoagulants: Secondary | ICD-10-CM | POA: Diagnosis not present

## 2018-07-15 DIAGNOSIS — I480 Paroxysmal atrial fibrillation: Secondary | ICD-10-CM | POA: Diagnosis not present

## 2018-07-15 DIAGNOSIS — G4733 Obstructive sleep apnea (adult) (pediatric): Secondary | ICD-10-CM | POA: Diagnosis not present

## 2018-07-15 DIAGNOSIS — K219 Gastro-esophageal reflux disease without esophagitis: Secondary | ICD-10-CM | POA: Diagnosis not present

## 2018-07-15 DIAGNOSIS — K760 Fatty (change of) liver, not elsewhere classified: Secondary | ICD-10-CM | POA: Diagnosis not present

## 2018-07-15 DIAGNOSIS — Z79899 Other long term (current) drug therapy: Secondary | ICD-10-CM | POA: Insufficient documentation

## 2018-07-15 DIAGNOSIS — E785 Hyperlipidemia, unspecified: Secondary | ICD-10-CM | POA: Diagnosis not present

## 2018-07-15 DIAGNOSIS — R001 Bradycardia, unspecified: Secondary | ICD-10-CM | POA: Diagnosis not present

## 2018-07-15 DIAGNOSIS — M810 Age-related osteoporosis without current pathological fracture: Secondary | ICD-10-CM | POA: Diagnosis not present

## 2018-07-15 MED ORDER — AMIODARONE HCL 200 MG PO TABS: 200.0000 mg | ORAL_TABLET | Freq: Every day | ORAL | 6 refills | Status: DC

## 2018-07-15 NOTE — Patient Instructions (Signed)
Amiodarone 200mg once a day    

## 2018-07-15 NOTE — Progress Notes (Signed)
Primary Care Physician: [name], MD Referring Physician: Melina Copa, PA Cardiologist: Dr. Sheria Lang is a 73 y.o. female with a h/o CAD, paroxysmal afib that is in the afib clinic for f/u of propafenone being stopped 2/2 to chest pain with NSTEMI  and receiving a new stent(3rd total) 1/10, EF 40-45%. Metoprolol was also stopped for brady, soft BP's. She went back into afib yesterday with chest pain and returned to the ER, she had returned to SR and chest pain was relieved upon arrival, negative troponin x 2 and she was discharged home.   Eugenia Mcalpine had sent Dr. Rayann Heman a Epic  message re f/u in the afib clinic  and  appropriate antiarrythmic's to use with pt as she is quite symptomatic in AF.   This was his response- "Outpatient options are limited. Cant use Ics with CAD. I probably wouldn't use multaq with her EF.  AAD options are sotalol, tikosyn, or amiodarone.   Another option would be to use amiodarone until she recovers from her recent hospitalization/ NSTEMI and then proceed with ablation in a couple months."   The pt is in [place]  today with a HR of 68 bpm. I discussed the above options and she would like the quickest drug to get started as she is planning to go to [facility] this week end and is terrified of another afib spell. She is very interested in short term amiodarone as a bridge to ablation in a couple of months to allow recovery form her NSTEMI.  F/u in afib clinic, 1/22. She is tolerating amiodarone load and reports that she did have some additional afib last week. However, when she saw Dr. Acie Fredrickson, he gave her prn propanolol and this had restored SR usually within the hour of an episode.    F/u in afib clinic , 07/15/18. She continues loading on amiodarone 200 mg bid. She has not had any more afib episodes. Does notice some exertional dyspnea at times.  Today, she denies symptoms of palpitations, chest pain, shortness of breath, orthopnea, PND, lower  extremity edema, dizziness, presyncope, syncope, or neurologic sequela. The patient is tolerating medications without difficulties and is otherwise without complaint today.   Past Medical History:  Diagnosis Date  . Arthritis   . CAD (coronary artery disease)    a. prior stenting history. b. NSTEMI s/p DES to prox LAD with EF 45% by cath 08/14/12. c. Mild trop elevation shortly after cath ?mild plaque embolization - patent stent on relook. // d. Myoview 8/18: EF 60, normal perfusion; Low Risk. e. NSTEMI 06/2018- patent prior LAD stent, 80% OM1 s/p DES, normal LVEDP, EF 45-50%, moderate residual disease treated medically.  . Fatty infiltration of liver   . GERD (gastroesophageal reflux disease)   . Hyperlipidemia   . Hypertension   . Hypokalemia   . Hypotension   . Myocardial infarction ([place]) 2014  . OSA on CPAP   . Osteoporosis   . PAF (paroxysmal atrial fibrillation) ([place])    On rythmol previously  . Pulmonary nodule    a. 30mm subpleural nodular density CT 08/2012, instructed to f/u pulm MD.  . Sinus bradycardia   . Tubular adenoma of colon 2007   Past Surgical History:  Procedure Laterality Date  . CORONARY ANGIOPLASTY WITH STENT PLACEMENT  08/14/2012   LAD  Dr [name]  . CORONARY STENT INTERVENTION N/A 06/14/2018   Procedure: CORONARY STENT INTERVENTION;  Surgeon: Jettie Booze, MD;  Location: [facility] LAB;  Service: Cardiovascular;  Laterality: N/A;  . CORONARY STENT PLACEMENT  2000  . hair restoration  02/2017  . LEFT HEART CATH AND CORONARY ANGIOGRAPHY N/A 06/14/2018   Procedure: LEFT HEART CATH AND CORONARY ANGIOGRAPHY;  Surgeon: Jettie Booze, MD;  Location: [facility];  Service: Cardiovascular;  Laterality: N/A;  . LEFT HEART CATHETERIZATION WITH CORONARY ANGIOGRAM N/A 08/14/2012   Procedure: LEFT HEART CATHETERIZATION WITH CORONARY ANGIOGRAM;  Surgeon: [name], MD;  Location: [facility] CATH LAB;  Service: Cardiovascular;  Laterality: N/A;  .  LEFT HEART CATHETERIZATION WITH CORONARY ANGIOGRAM N/A 08/19/2012   Procedure: LEFT HEART CATHETERIZATION WITH CORONARY ANGIOGRAM;  Surgeon: [name], MD;  Location: [facility] CATH LAB;  Service: Cardiovascular;  Laterality: N/A;  . ROTATOR CUFF REPAIR    . TONSILLECTOMY    . TOTAL ABDOMINAL HYSTERECTOMY      Current Outpatient Medications  Medication Sig Dispense Refill  . acetaminophen (TYLENOL) 325 MG tablet Take 325-650 mg by mouth every 8 (eight) hours as needed (for pain).     . AMBULATORY NON FORMULARY MEDICATION Take 180 mg by mouth daily. Medication Name: bempedoic acid 180 mg vs a placebo.  CLEAR Research Study, drug provided    . amiodarone (PACERONE) 200 MG tablet Take 1 tablet (200 mg total) by mouth daily. 30 tablet 6  . Cholecalciferol (VITAMIN D3) 25 MCG (1000 UT) CAPS Take 1,000 Units by mouth daily.    . clopidogrel (PLAVIX) 75 MG tablet Take 1 tablet (75 mg total) by mouth daily. 30 tablet 11  . Cyanocobalamin (B-12) 1000 MCG LOZG Take 1,000 mcg by mouth 2 (two) times daily.     Marland Kitchen ELIQUIS 5 MG TABS tablet TAKE 1 TABLET(5 MG) BY MOUTH TWICE DAILY 180 tablet 2  . fluticasone (FLONASE) 50 MCG/ACT nasal spray Place 2 sprays into both nostrils daily. (Patient taking differently: Place 2 sprays into both nostrils daily as needed for allergies or rhinitis. ) 16 g 6  . losartan (COZAAR) 25 MG tablet Take 2 tablets (50 mg total) by mouth daily. 60 tablet 11  . pantoprazole (PROTONIX) 40 MG tablet TAKE 1 TABLET(40 MG) BY MOUTH DAILY (Patient taking differently: Take 40 mg by mouth daily. ) 30 tablet 6  . potassium chloride (K-DUR) 10 MEQ tablet Take 1 tablet (10 mEq total) by mouth daily. 90 tablet 3  . propranolol (INDERAL) 10 MG tablet Take 1 tablet (10 mg total) by mouth 4 (four) times daily as needed. 60 tablet 11  . temazepam (RESTORIL) 15 MG capsule Take 15 mg by mouth at bedtime as needed for sleep.    . nitroGLYCERIN (NITROSTAT) 0.4 MG SL tablet Place 1 tablet (0.4 mg total)  under the tongue every 5 (five) minutes as needed for chest pain (up to 3 doses). (Patient not taking: Reported on 07/15/2018) 25 tablet 6   No current facility-administered medications for this encounter.     Allergies  Allergen Reactions  . Isosorbide     Headache  . Other Anxiety and Other (See Comments)    Intolerance to strong pain medications=  Make her feel anxious and feel like coming out of her skin  . Effexor [Venlafaxine] Other (See Comments)    CAUSED TOO MUCH SEDATION; CANNOT TOLERATE  . Statins Other (See Comments)    Restless legs, bad feeling.  This has occurred with Crestor 5 mg once weekly, Lipitor 10 mg twice weekly, simvastatin daily  . Zetia [Ezetimibe]     Leg aches  Social History   Socioeconomic History  . Marital status: Married    Spouse name: Not on file  . Number of children: 2  . Years of education: Not on file  . Highest education level: Not on file  Occupational History  . 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 Financial resource strain: Not on file  . Food insecurity:    Worry: Not on file    Inability: Not on file  . Transportation needs:    Medical: Not on file    Non-medical: Not on file  Tobacco Use  . Smoking status: Never Smoker  . Smokeless tobacco: Never Used  Substance and Sexual Activity  . Alcohol use: Yes    Comment: 2 drinks daily   . Drug use: No  . Sexual activity: Not Currently  Lifestyle  . Physical activity:    Days per week: Not on file    Minutes per session: Not on file  . Stress: Not on file  Relationships  . Social connections:    Talks on phone: Not on file    Gets together: Not on file    Attends religious service: Not on file    Active member of club or organization: Not on file    Attends meetings of clubs or organizations: Not on file    Relationship status: Not on file  . Intimate partner violence:    Fear of current or ex  partner: Not on file    Emotionally abused: Not on file    Physically abused: Not on file    Forced sexual activity: Not on file  Other Topics Concern  . Not on file  Social History Narrative   Daily caffeine     Family History  Problem Relation Age of Onset  . Heart disease Father   . Alzheimer's disease Mother   . Breast cancer Maternal Aunt   . Colon cancer Neg Hx     ROS- All systems are reviewed and negative except as per the HPI above  Physical Exam: Vitals:   07/15/18 1146  BP: (!) 142/86  Pulse: (!) 58  Weight: 63 kg  Height: 5' (1.524 m)   Wt Readings from Last 3 Encounters:  07/15/18 63 kg  06/26/18 63 kg  06/18/18 62.6 kg    Labs: Lab Results  Component Value Date   NA 138 06/17/2018   K 3.6 06/17/2018   CL 104 06/17/2018   CO2 23 06/17/2018   GLUCOSE 105 (H) 06/17/2018   BUN 10 06/17/2018   CREATININE 0.98 06/17/2018   CALCIUM 8.7 (L) 06/17/2018   MG 2.0 08/17/2012   Lab Results  Component Value Date   INR 1.13 06/13/2018   Lab Results  Component Value Date   CHOL 256 (H) 06/23/2015   HDL 44 (L) 06/23/2015   LDLCALC 169 (H) 06/23/2015   TRIG 215 (H) 06/23/2015     GEN- The patient is well appearing, alert and oriented x 3 today.   Head- normocephalic, atraumatic Eyes-  Sclera clear, conjunctiva pink Ears- hearing intact Oropharynx- clear Neck- supple, no JVP Lymph- no cervical lymphadenopathy Lungs- Clear to ausculation bilaterally, normal work of breathing Heart- Regular rate and rhythm, no murmurs, rubs or gallops, PMI not laterally displaced GI- soft, NT, ND, + BS Extremities- no clubbing, cyanosis, or edema MS- no significant deformity or atrophy Skin- no rash or lesion Psych- euthymic  mood, full affect Neuro- strength and sensation are intact  EKG-NSR at 58 bpm, pr int 202 ms, qrs int 88 ms, qtc 416 ms Epic records reviewed  LHC-Previously placed Prox LAD stent is widely patent.  RPDA lesion is 50% stenosed.  Ost 1st  Mrg lesion is 25% stenosed.  1st Mrg lesion is 80% stenosed.  A drug-eluting stent was successfully placed using a STENT SYNERGY DES 2.5X12.  Post intervention, there is a 0% residual stenosis.  Ost 1st Diag lesion is 25% stenosed.  The left ventricular systolic function is normal.  LV end diastolic pressure is normal. LVEDP 11 mm Hg.  The left ventricular ejection fraction is 45-50% by visual estimate.  There is no aortic valve stenosis. Echo- Study Conclusions  - Left ventricle: The cavity size was normal. Systolic function was   mildly to moderately reduced. The estimated ejection fraction was   in the range of 40% to 45%. Hypokinesis of the   basal-midanterolateral and inferolateral myocardium; consistent   with ischemia or infarction in the distribution of the left   circumflex coronary artery. Doppler parameters are consistent   with abnormal left ventricular relaxation (grade 1 diastolic   dysfunction). Doppler parameters are consistent with elevated   mean left atrial filling pressure. - Pericardium, extracardiac: There was a left pleural effusion.    Assessment and Plan: 1. Paroxsymal  afib Currently quiet Pt very symptomatic with afib episodes Pt wanted to use amiodarone as a short term bridge to ablation Decrease dose of amiodarone to 200 mg daily Continue to use prn propanolol as needed for afib epiosdes TSH/ liver panel in November 2019 wnl, recent cxr wnl  2. CHA2DS2VASc score of 5 Now on clopidogrel and eliquis therapy, asa stopped Bleeding precautions discussed  3. CAD Exertional dyspnea may be from amiodarone loading decreasing to 1x a day may help If persists bring to the attention of Dr. Acie Fredrickson  F/u with Dr. Rayann Heman in March to discuss ablation  [place], [facility] [address] [place], [place] [address] [phone]

## 2018-07-17 DIAGNOSIS — L718 Other rosacea: Secondary | ICD-10-CM | POA: Diagnosis not present

## 2018-07-23 ENCOUNTER — Encounter: Payer: Medicare Other | Admitting: *Deleted

## 2018-07-23 ENCOUNTER — Telehealth (HOSPITAL_COMMUNITY): Payer: Self-pay

## 2018-07-23 VITALS — BP 138/88 | HR 59 | Wt 134.0 lb

## 2018-07-23 DIAGNOSIS — Z006 Encounter for examination for normal comparison and control in clinical research program: Secondary | ICD-10-CM

## 2018-07-24 ENCOUNTER — Other Ambulatory Visit: Payer: Self-pay | Admitting: Nurse Practitioner

## 2018-07-24 DIAGNOSIS — I2510 Atherosclerotic heart disease of native coronary artery without angina pectoris: Secondary | ICD-10-CM

## 2018-07-24 DIAGNOSIS — Z006 Encounter for examination for normal comparison and control in clinical research program: Secondary | ICD-10-CM

## 2018-07-24 DIAGNOSIS — E785 Hyperlipidemia, unspecified: Secondary | ICD-10-CM

## 2018-07-24 NOTE — Progress Notes (Signed)
Ordering Lipid Clinic referral and sending to [facility] for scheduling  From: Eulah Pont, RN  Sent: 07/23/2018  3:01 PM EST  To: Thayer Headings, MD  Subject: Lipid clinic referral               Hi Dr. Acie Fredrickson, I saw Shannon Cummings in the research clinic today. She is enrolled in our Clear Research study. Since she had her cardiac events in January she'd like to know her lipid panel results. Results for [place] are blinded. She would like to explore her options, such as PCSK9is, and would like a referral to the lipid clinic. Our last known lipid panel for her was 12/14/2015. Even if she has a lipid panel drawn and possibly goes on a PCSK9i, I can still keep her in the study as enrolled off drug, to which she is agreeable. She's a great study patient and I'd appreciate your making a referral for her.  Thanks

## 2018-07-25 NOTE — Research (Signed)
Subject to research clinic for visit T12-M30 in the Clear Research study.  Experienced an endpoint in January 2020 which has been reported.  No cos today, aes or saes to report.  99% compliant with meds. Stated had 3 weeks of drug (21 tabs) in dispenser at home.  Instructed to not take any of the remaining tabs, start taking the newly dispensed.  Next phone call and follow up visit scheduled.

## 2018-07-26 ENCOUNTER — Telehealth (HOSPITAL_COMMUNITY): Payer: Self-pay | Admitting: Pharmacist

## 2018-07-26 NOTE — Progress Notes (Signed)
Shannon Cummings 73 y.o. female DOB [birth year] MRN [id number]       Nutrition Screen Note  No diagnosis found. Past Medical History:  Diagnosis Date  . Arthritis   . CAD (coronary artery disease)    a. prior stenting history. b. NSTEMI s/p DES to prox LAD with EF 45% by cath 08/14/12. c. Mild trop elevation shortly after cath ?mild plaque embolization - patent stent on relook. // d. Myoview 8/18: EF 60, normal perfusion; Low Risk. e. NSTEMI 06/2018- patent prior LAD stent, 80% OM1 s/p DES, normal LVEDP, EF 45-50%, moderate residual disease treated medically.  . Fatty infiltration of liver   . GERD (gastroesophageal reflux disease)   . Hyperlipidemia   . Hypertension   . Hypokalemia   . Hypotension   . Myocardial infarction ([place]) 2014  . OSA on CPAP   . Osteoporosis   . PAF (paroxysmal atrial fibrillation) ([place])    On rythmol previously  . Pulmonary nodule    a. 28mm subpleural nodular density CT 08/2012, instructed to f/u pulm MD.  . Sinus bradycardia   . Tubular adenoma of colon 2007   Meds reviewed.     Current Outpatient Medications (Cardiovascular):  .  amiodarone (PACERONE) 200 MG tablet, Take 1 tablet (200 mg total) by mouth daily. Marland Kitchen  losartan (COZAAR) 25 MG tablet, Take 2 tablets (50 mg total) by mouth daily. .  nitroGLYCERIN (NITROSTAT) 0.4 MG SL tablet, Place 1 tablet (0.4 mg total) under the tongue every 5 (five) minutes as needed for chest pain (up to 3 doses). (Patient not taking: Reported on 07/15/2018) .  propranolol (INDERAL) 10 MG tablet, Take 1 tablet (10 mg total) by mouth 4 (four) times daily as needed.  Current Outpatient Medications (Respiratory):  .  fluticasone (FLONASE) 50 MCG/ACT nasal spray, Place 2 sprays into both nostrils daily. (Patient taking differently: Place 2 sprays into both nostrils daily as needed for allergies or rhinitis. )  Current Outpatient Medications (Analgesics):  .  acetaminophen (TYLENOL) 325 MG tablet, Take 325-650 mg by mouth every 8  (eight) hours as needed (for pain).   Current Outpatient Medications (Hematological):  .  clopidogrel (PLAVIX) 75 MG tablet, Take 1 tablet (75 mg total) by mouth daily. .  Cyanocobalamin (B-12) 1000 MCG LOZG, Take 1,000 mcg by mouth 2 (two) times daily.  Marland Kitchen  ELIQUIS 5 MG TABS tablet, TAKE 1 TABLET(5 MG) BY MOUTH TWICE DAILY  Current Outpatient Medications (Other):  Marland Kitchen  AMBULATORY NON FORMULARY MEDICATION, Take 180 mg by mouth daily. Medication Name: bempedoic acid 180 mg vs a placebo.  CLEAR Research Study, drug provided .  Cholecalciferol (VITAMIN D3) 25 MCG (1000 UT) CAPS, Take 1,000 Units by mouth daily. .  pantoprazole (PROTONIX) 40 MG tablet, TAKE 1 TABLET(40 MG) BY MOUTH DAILY (Patient taking differently: Take 40 mg by mouth daily. ) .  potassium chloride (K-DUR) 10 MEQ tablet, Take 1 tablet (10 mEq total) by mouth daily. .  temazepam (RESTORIL) 15 MG capsule, Take 15 mg by mouth at bedtime as needed for sleep.   HT: Ht Readings from Last 1 Encounters:  07/15/18 5' (1.524 m)    WT: Wt Readings from Last 5 Encounters:  07/23/18 134 lb (60.8 kg)  07/15/18 139 lb (63 kg)  06/26/18 139 lb (63 kg)  06/18/18 138 lb (62.6 kg)  06/18/18 138 lb 3.2 oz (62.7 kg)     BMI = 26.99   Current tobacco use? No       Labs:  Lipid Panel     Component Value Date/Time   CHOL 256 (H) 06/23/2015 1153   TRIG 215 (H) 06/23/2015 1153   HDL 44 (L) 06/23/2015 1153   CHOLHDL 5.8 (H) 06/23/2015 1153   VLDL 43 (H) 06/23/2015 1153   LDLCALC 169 (H) 06/23/2015 1153   LDLDIRECT 174.0 12/31/2012 0754    Lab Results  Component Value Date   HGBA1C 5.7 (H) 08/14/2012   CBG (last 3)  No results for input(s): GLUCAP in the last 72 hours.  Nutrition Diagnosis ? Food-and nutrition-related knowledge deficit related to lack of exposure to information as related to diagnosis of: ? CVD   Nutrition Goal(s):  ? To be determined  Plan:  Pt to attend nutrition classes ? Nutrition I ? Nutrition II ?  Portion Distortion  Will provide client-centered nutrition education as part of interdisciplinary care.   Monitor and evaluate progress toward nutrition goal with team.  Laurina Bustle, MS, RD, LDN 07/26/2018 2:50 PM

## 2018-07-29 ENCOUNTER — Telehealth (HOSPITAL_COMMUNITY): Payer: Self-pay | Admitting: Pharmacist

## 2018-07-30 ENCOUNTER — Telehealth (HOSPITAL_COMMUNITY): Payer: Self-pay | Admitting: Pharmacist

## 2018-07-30 NOTE — Telephone Encounter (Signed)
Cardiac Rehab - Pharmacy Resident Documentation   Patient unable to be reached after three call attempts. Please complete allergy verification and medication review during patient's cardiac rehab appointment.     

## 2018-07-31 ENCOUNTER — Ambulatory Visit (INDEPENDENT_AMBULATORY_CARE_PROVIDER_SITE_OTHER): Payer: Medicare Other | Admitting: Cardiovascular Disease

## 2018-07-31 ENCOUNTER — Encounter: Payer: Self-pay | Admitting: Cardiovascular Disease

## 2018-07-31 VITALS — BP 152/84 | HR 60 | Ht 60.0 in | Wt 137.1 lb

## 2018-07-31 DIAGNOSIS — I10 Essential (primary) hypertension: Secondary | ICD-10-CM

## 2018-07-31 DIAGNOSIS — I480 Paroxysmal atrial fibrillation: Secondary | ICD-10-CM | POA: Diagnosis not present

## 2018-07-31 DIAGNOSIS — I2510 Atherosclerotic heart disease of native coronary artery without angina pectoris: Secondary | ICD-10-CM

## 2018-07-31 DIAGNOSIS — E785 Hyperlipidemia, unspecified: Secondary | ICD-10-CM

## 2018-07-31 DIAGNOSIS — I25119 Atherosclerotic heart disease of native coronary artery with unspecified angina pectoris: Secondary | ICD-10-CM

## 2018-07-31 LAB — BASIC METABOLIC PANEL
BUN/Creatinine Ratio: 12 (ref 12–28)
BUN: 11 mg/dL (ref 8–27)
CO2: 22 mmol/L (ref 20–29)
Calcium: 9.1 mg/dL (ref 8.7–10.3)
Chloride: 101 mmol/L (ref 96–106)
Creatinine, Ser: 0.92 mg/dL (ref 0.57–1.00)
GFR calc Af Amer: 72 mL/min/{1.73_m2} (ref >59)
GFR calc non Af Amer: 62 mL/min/{1.73_m2} (ref >59)
Glucose: 91 mg/dL (ref 65–99)
Potassium: 4 mmol/L (ref 3.5–5.2)
SODIUM: 140 mmol/L (ref 134–144)

## 2018-07-31 LAB — LIPID PANEL
Chol/HDL Ratio: 5.6 ratio — ABNORMAL HIGH (ref 0.0–4.4)
Cholesterol, Total: 276 mg/dL — ABNORMAL HIGH (ref 100–199)
HDL: 49 mg/dL (ref >39)
LDL Calculated: 190 mg/dL — ABNORMAL HIGH (ref 0–99)
TRIGLYCERIDES: 185 mg/dL — AB (ref 0–149)
VLDL Cholesterol Cal: 37 mg/dL (ref 5–40)

## 2018-07-31 LAB — HEPATIC FUNCTION PANEL
ALT: 41 IU/L — ABNORMAL HIGH (ref 0–32)
AST: 32 IU/L (ref 0–40)
Albumin: 4.5 g/dL (ref 3.7–4.7)
Alkaline Phosphatase: 66 IU/L (ref 39–117)
Bilirubin Total: 0.6 mg/dL (ref 0.0–1.2)
Bilirubin, Direct: 0.12 mg/dL (ref 0.00–0.40)
Total Protein: 6.9 g/dL (ref 6.0–8.5)

## 2018-07-31 MED ORDER — SPIRONOLACTONE 25 MG PO TABS: 12.5000 mg | ORAL_TABLET | Freq: Every day | ORAL | 3 refills | Status: DC

## 2018-07-31 NOTE — Patient Instructions (Addendum)
Medication Instructions:  Your physician has recommended you make the following change in your medication:  1.) start spironolactone (Aldactone) 12.5 mg once a day  If you need a refill on your cardiac medications before your next appointment, please call your pharmacy.   Lab work: Today: bmet, lfts, lipids IN 1-2 weeks: bmet  If you have labs (blood work) drawn today and your tests are completely normal, you will receive your results only by: Marland Kitchen MyChart Message (if you have MyChart) OR . A paper copy in the mail If you have any lab test that is abnormal or we need to change your treatment, we will call you to review the results.  Testing/Procedures: none  Follow-Up: At [facility], you and your health needs are our priority.  As part of our continuing mission to provide you with exceptional heart care, we have created designated Provider Care Teams.  These Care Teams include your primary Cardiologist (physician) and Advanced Practice Providers (APPs -  Physician Assistants and Nurse Practitioners) who all work together to provide you with the care you need, when you need it. You will need a follow up appointment in:  3 months.  Please call our office 2 months in advance to schedule this appointment.  You may see [name], MD or one of the following Advanced Practice Providers on your designated Care Team: Richardson Dopp, PA-C [place], [place] . Daune Perch, NP  Any Other Special Instructions Will Be Listed Below (If Applicable).

## 2018-07-31 NOTE — Progress Notes (Signed)
Cardiology Office Note   Date:  07/31/2018   ID:  Shannon Cummings, Shannon Cummings 16-Jul-1945, MRN [id number]  PCP:  [name], MD  Cardiologist:   [name], MD   Chief Complaint  Patient presents with  . Coronary Artery Disease  . Atrial Fibrillation   1. Coronary artery disease-status post stenting in the past. She status post stenting of her proximal LAD 08/14/2012 2. Intermittent atrial fibrillation 3.  hyperlipidemia-she has been generally intolerant to most statins   Pt is doing well. No cardiac complaints.   She complains of some tingling and numbness associated with the Crestor. She has discontinued the Crestor because of that reason. She has had reactions to most statins that we have tried.  October 08-2011  She has done well. She has not had any angina or eposides of atrial fib. She has retired and is traveling quite a bit. Her husband has also retired. She has not had any problems.  October 01, 2012:  She is having some bruising ( due to Shannon Cummings). She is enjoying the cardiac rehab. She has noticed some low BP readings at the end of cardiac rehab. She does not feel bad. These low readings are typically asymptomatic.   No angina. No arrhythmias.   August 20, 2013:  Shannon Cummings is doing ok. She was admitted to the hospital with some CP recently. Rule out for MI.  She has been doing Corazon. Has gained a bit of weight. She is not able to exercise because Of left ankle problems.    August 19, 2014:   Shannon Cummings is a 73 y.o. female who presents for follow up of her atrial fib. Has gained some weight.  She is in the study for lipid management Has had leg cramps since last fall. Off and on  Has had more paroxysmal atrial fib.  Lasts about a minute. Seem to be occuring more frequently.   Jan. 18, 2017:  Shannon Cummings presents for follow up of her CAD and atrial fib.  Doing well.   Has visited [place] recently .   No CP or dyspnea.   Tolerates the  Rythmol.  Talked about her daughter who lives in [place].  Is not having any significant atrial fib issues.   Has been on lipid lowering study drug at [facility]  ( injectable cholesterol medication )   Jan.   25, 2018:  Doing well.   Feeling well Has had a stomach bug last week and now has a URI  - not the flu.   Has maintained NSR .  Is having hair loss.  Her research shows that amlodipine and atenolol can cause this   Aug. 23, 2018:  Shannon Cummings is seen today for eval of some elevated BP Has been having pain in her back, tightness in her chest  Went to ER.  Has had elevated BP for a week,   Went back to ER  Doubled her metoprolol XL .  Saw Richardson Dopp on Aug. 6. 2018 Was given Imdur - developed a headache that lasted 3 days  Lexiscan myoview was low risk.   Her back pain / chest tightness has resolved.   May 08, 2017: Shannon Cummings seen today for follow-up of her hypertension, coronary artery disease, paroxysmal atrial fibrillation. Is feeling well  Is taking the Toprol XL just once a day - not BID as listed in MAR .   August 15, 2017   Shannon Cummings is doing well BP is much better.  Is exercising and watching  her diet.   Is on a study drug for cholesterol   June 18, 2018: He was admitted to the hospital recently with a non-ST segment elevation myocardial infarction. The previously placed LAD stent was widely patent.  She had a tight stenosis in her first obtuse marginal artery that was stented. Function is mildly reduced with an EF of 45 to 50%.  She returned to the ER the day after she went home with an episode of rapid AF and CP .    Troponin was  0.18  Her rhythmol has been stopped . Saw Roderic Palau and was started on Amiodarone . The plan is for short term Amio and then consider AF ablation . They also discussed Tikosyn .  She tolerates her AF poorly.  We discussed taking short acting beta blockers if she has AF with CP  Had been walking ,   Has been weak since  the MI.    Feb. 26, 2020  Shannon Cummings is seen for follow up of her CAD and PAF Has had some shortness of breath . BP is a bit elevated today  amio has been reduced to 200 mg a day  Has an appt with Dr. Rayann Heman on March 23 to discuss ablation  Is in a lipid study    Past Medical History:  Diagnosis Date  . Arthritis   . CAD (coronary artery disease)    a. prior stenting history. b. NSTEMI s/p DES to prox LAD with EF 45% by cath 08/14/12. c. Mild trop elevation shortly after cath ?mild plaque embolization - patent stent on relook. // d. Myoview 8/18: EF 60, normal perfusion; Low Risk. e. NSTEMI 06/2018- patent prior LAD stent, 80% OM1 s/p DES, normal LVEDP, EF 45-50%, moderate residual disease treated medically.  . Fatty infiltration of liver   . GERD (gastroesophageal reflux disease)   . Hyperlipidemia   . Hypertension   . Hypokalemia   . Hypotension   . Myocardial infarction ([place]) 2014  . OSA on CPAP   . Osteoporosis   . PAF (paroxysmal atrial fibrillation) ([place])    On rythmol previously  . Pulmonary nodule    a. 34mm subpleural nodular density CT 08/2012, instructed to f/u pulm MD.  . Sinus bradycardia   . Tubular adenoma of colon 2007    Past Surgical History:  Procedure Laterality Date  . CORONARY ANGIOPLASTY WITH STENT PLACEMENT  08/14/2012   LAD  Dr [name]  . CORONARY STENT INTERVENTION N/A 06/14/2018   Procedure: CORONARY STENT INTERVENTION;  Surgeon: Jettie Booze, MD;  Location: [place] [facility];  Service: Cardiovascular;  Laterality: N/A;  . CORONARY STENT PLACEMENT  2000  . hair restoration  02/2017  . LEFT HEART CATH AND CORONARY ANGIOGRAPHY N/A 06/14/2018   Procedure: LEFT HEART CATH AND CORONARY ANGIOGRAPHY;  Surgeon: Jettie Booze, MD;  Location: [facility];  Service: Cardiovascular;  Laterality: N/A;  . LEFT HEART CATHETERIZATION WITH CORONARY ANGIOGRAM N/A 08/14/2012   Procedure: LEFT HEART CATHETERIZATION WITH CORONARY ANGIOGRAM;   Surgeon: [name], MD;  Location: [facility] C[facility];  Service: Cardiovascular;  Laterality: N/A;  . LEFT HEART CATHETERIZATION WITH CORONARY ANGIOGRAM N/A 08/19/2012   Procedure: LEFT HEART CATHETERIZATION WITH CORONARY ANGIOGRAM;  Surgeon: [name], MD;  Location: [facility] CATH LAB;  Service: Cardiovascular;  Laterality: N/A;  . ROTATOR CUFF REPAIR    . TONSILLECTOMY    . TOTAL ABDOMINAL HYSTERECTOMY       Current Outpatient Medications  Medication  Sig Dispense Refill  . acetaminophen (TYLENOL) 325 MG tablet Take 325-650 mg by mouth every 8 (eight) hours as needed (for pain).     . AMBULATORY NON FORMULARY MEDICATION Take 180 mg by mouth daily. Medication Name: bempedoic acid 180 mg vs a placebo.  CLEAR Research Study, drug provided    . amiodarone (PACERONE) 200 MG tablet Take 1 tablet (200 mg total) by mouth daily. 30 tablet 6  . Cholecalciferol (VITAMIN D3) 25 MCG (1000 UT) CAPS Take 1,000 Units by mouth daily.    . clopidogrel (PLAVIX) 75 MG tablet Take 1 tablet (75 mg total) by mouth daily. 30 tablet 11  . Cyanocobalamin (B-12) 1000 MCG LOZG Take 1,000 mcg by mouth 2 (two) times daily.     Marland Kitchen ELIQUIS 5 MG TABS tablet TAKE 1 TABLET(5 MG) BY MOUTH TWICE DAILY 180 tablet 2  . fluticasone (FLONASE) 50 MCG/ACT nasal spray Place 2 sprays into both nostrils daily as needed for allergies or rhinitis.    Marland Kitchen losartan (COZAAR) 25 MG tablet Take 2 tablets (50 mg total) by mouth daily. 60 tablet 11  . nitroGLYCERIN (NITROSTAT) 0.4 MG SL tablet Place 1 tablet (0.4 mg total) under the tongue every 5 (five) minutes as needed for chest pain (up to 3 doses). 25 tablet 6  . pantoprazole (PROTONIX) 40 MG tablet Take 40 mg by mouth daily.    . potassium chloride (K-DUR) 10 MEQ tablet Take 1 tablet (10 mEq total) by mouth daily. 90 tablet 3  . propranolol (INDERAL) 10 MG tablet Take 1 tablet (10 mg total) by mouth 4 (four) times daily as needed. 60 tablet 11  . temazepam (RESTORIL) 15 MG capsule Take 15 mg by  mouth at bedtime as needed for sleep.     No current facility-administered medications for this visit.     Allergies:   Isosorbide; Other; Effexor [venlafaxine]; Statins; and Zetia [ezetimibe]    Social History:  The patient  reports that she has never smoked. She has never used smokeless tobacco. She reports current alcohol use. She reports that she does not use drugs.   Family History:  The patient's family history includes Alzheimer's disease in her mother; Breast cancer in her maternal aunt; Heart disease in her father.    ROS: Noted in current history.  All other systems are negative.  Physical Exam: Blood pressure (!) 152/84, pulse 60, height 5' (1.524 m), weight 137 lb 1.9 oz (62.2 kg), SpO2 98 %.  GEN:  Well nourished, well developed in no acute distress HEENT: Normal NECK: No JVD; No carotid bruits LYMPHATICS: No lymphadenopathy CARDIAC: RRR , no murmurs, rubs, gallops RESPIRATORY:  Clear to auscultation without rales, wheezing or rhonchi  ABDOMEN: Soft, non-tender, non-distended MUSCULOSKELETAL:  No edema; No deformity  SKIN: Warm and dry NEUROLOGIC:  Alert and oriented x 3    EKG:   .    Recent Labs: 04/30/2018: ALT 26; TSH 3.49 06/17/2018: BUN 10; Creatinine, Ser 0.98; Hemoglobin 14.4; Platelets 194; Potassium 3.6; Sodium 138    Lipid Panel    Component Value Date/Time   CHOL 256 (H) 06/23/2015 1153   TRIG 215 (H) 06/23/2015 1153   HDL 44 (L) 06/23/2015 1153   CHOLHDL 5.8 (H) 06/23/2015 1153   VLDL 43 (H) 06/23/2015 1153   LDLCALC 169 (H) 06/23/2015 1153   LDLDIRECT 174.0 12/31/2012 0754      Wt Readings from Last 3 Encounters:  07/31/18 137 lb 1.9 oz (62.2 kg)  07/23/18 134 lb (60.8  kg)  07/15/18 139 lb (63 kg)      Other studies Reviewed: Additional studies/ records that were reviewed today include: . Review of the above records demonstrates:    ASSESSMENT AND PLAN:  1. Coronary artery disease-  She is doing well from a cardiac  standpoint.  She is not had any episodes of angina.  Continue current medications.  .   2. Intermittent atrial fibrillation -     on amiodarone 2 mg a day.  She is scheduled to see Dr. Rayann Heman in 1 month.  We will anticipate doing a transesophageal echo on her prior to her ablation.  3.  hyperlipidemia-   is been enrolled in the clear trial for the past several years.  We will check her lipids today.  She will be seen lipid clinic in 2 weeks.  I think that she should be considered for PCSK9 inhibitor.   4. Essential HTN:     -Pressure is minimally elevated.  Asthma levels have been low despite taking a potassium supplement.  We will add Aldactone 12.5 mg a day.  We will check a basic metabolic profile in 1 to 2 weeks.  She will continue to measure her blood pressure readings.  . I will see her again in 3 months.   Current medicines are reviewed at length with the patient today.  The patient does not have concerns regarding medicines.  The following changes have been made:  no change  Labs/ tests ordered today include:   No orders of the defined types were placed in this encounter.   Signed, [name], MD  07/31/2018 8:51 AM    [facility] [place], [place], [place]  [address] Phone: [phone]; Fax: [phone]

## 2018-08-01 ENCOUNTER — Encounter (HOSPITAL_COMMUNITY): Payer: Self-pay

## 2018-08-01 ENCOUNTER — Encounter (HOSPITAL_COMMUNITY)
Admission: RE | Admit: 2018-08-01 | Discharge: 2018-08-01 | Disposition: A | Discharge status: Home / Self Care | Payer: Medicare Other | Source: Ambulatory Visit | Attending: Cardiovascular Disease | Admitting: Cardiovascular Disease

## 2018-08-01 VITALS — BP 128/72 | HR 66 | Ht 60.0 in | Wt 137.6 lb

## 2018-08-01 DIAGNOSIS — Z955 Presence of coronary angioplasty implant and graft: Secondary | ICD-10-CM | POA: Insufficient documentation

## 2018-08-01 DIAGNOSIS — I214 Non-ST elevation (NSTEMI) myocardial infarction: Secondary | ICD-10-CM | POA: Insufficient documentation

## 2018-08-01 NOTE — Progress Notes (Signed)
Cardiac Individual Treatment Plan  Patient Details  Name: Shannon Cummings MRN: [id number] Date of Birth: July 21, 1945 Referring Provider:     CARDIAC REHAB PHASE II ORIENTATION from 08/01/2018 in [place]  Referring Provider  Nahser, Wonda Cheng MD      Initial Encounter Date:    CARDIAC REHAB PHASE II ORIENTATION from 08/01/2018 in [place]  Date  08/01/18      Visit Diagnosis: NSTEMI (non-ST elevated myocardial infarction) [facility])  Status post coronary artery stent placement  Patient's Home Medications on Admission:  Current Outpatient Medications:  .  acetaminophen (TYLENOL) 325 MG tablet, Take 325-650 mg by mouth every 8 (eight) hours as needed (for pain). , Disp: , Rfl:  .  AMBULATORY NON FORMULARY MEDICATION, Take 180 mg by mouth daily. Medication Name: bempedoic acid 180 mg vs a placebo.  CLEAR Research Study, drug provided, Disp: , Rfl:  .  amiodarone (PACERONE) 200 MG tablet, Take 1 tablet (200 mg total) by mouth daily., Disp: 30 tablet, Rfl: 6 .  Cholecalciferol (VITAMIN D3) 25 MCG (1000 UT) CAPS, Take 1,000 Units by mouth daily., Disp: , Rfl:  .  clopidogrel (PLAVIX) 75 MG tablet, Take 1 tablet (75 mg total) by mouth daily., Disp: 30 tablet, Rfl: 11 .  Cyanocobalamin (B-12) 1000 MCG LOZG, Take 1,000 mcg by mouth 2 (two) times daily. , Disp: , Rfl:  .  ELIQUIS 5 MG TABS tablet, TAKE 1 TABLET(5 MG) BY MOUTH TWICE DAILY, Disp: 180 tablet, Rfl: 2 .  fluticasone (FLONASE) 50 MCG/ACT nasal spray, Place 2 sprays into both nostrils daily as needed for allergies or rhinitis., Disp: , Rfl:  .  losartan (COZAAR) 25 MG tablet, Take 2 tablets (50 mg total) by mouth daily., Disp: 60 tablet, Rfl: 11 .  nitroGLYCERIN (NITROSTAT) 0.4 MG SL tablet, Place 1 tablet (0.4 mg total) under the tongue every 5 (five) minutes as needed for chest pain (up to 3 doses)., Disp: 25 tablet, Rfl: 6 .  pantoprazole (PROTONIX) 40 MG tablet, Take 40  mg by mouth daily., Disp: , Rfl:  .  potassium chloride (K-DUR) 10 MEQ tablet, Take 1 tablet (10 mEq total) by mouth daily., Disp: 90 tablet, Rfl: 3 .  propranolol (INDERAL) 10 MG tablet, Take 1 tablet (10 mg total) by mouth 4 (four) times daily as needed., Disp: 60 tablet, Rfl: 11 .  spironolactone (ALDACTONE) 25 MG tablet, Take 0.5 tablets (12.5 mg total) by mouth daily., Disp: 45 tablet, Rfl: 3 .  temazepam (RESTORIL) 15 MG capsule, Take 15 mg by mouth at bedtime as needed for sleep., Disp: , Rfl:   Past Medical History: Past Medical History:  Diagnosis Date  . Arthritis   . CAD (coronary artery disease)    a. prior stenting history. b. NSTEMI s/p DES to prox LAD with EF 45% by cath 08/14/12. c. Mild trop elevation shortly after cath ?mild plaque embolization - patent stent on relook. // d. Myoview 8/18: EF 60, normal perfusion; Low Risk. e. NSTEMI 06/2018- patent prior LAD stent, 80% OM1 s/p DES, normal LVEDP, EF 45-50%, moderate residual disease treated medically.  . Fatty infiltration of liver   . GERD (gastroesophageal reflux disease)   . Hyperlipidemia   . Hypertension   . Hypokalemia   . Hypotension   . Myocardial infarction ([place]) 2014  . OSA on CPAP   . Osteoporosis   . PAF (paroxysmal atrial fibrillation) ([place])    On rythmol previously  .  Pulmonary nodule    a. 67mm subpleural nodular density CT 08/2012, instructed to f/u pulm MD.  . Sinus bradycardia   . Tubular adenoma of colon 2007    Tobacco Use: Social History   Tobacco Use  Smoking Status Never Smoker  Smokeless Tobacco Never Used    Labs: Recent Review Flowsheet Data    Labs for ITP Cardiac and Pulmonary Rehab Latest Ref Rng & Units 10/02/2012 12/31/2012 07/29/2013 06/23/2015 07/31/2018   Cholestrol 100 - 199 mg/dL 263(H) 251(H) 245(H) 256(H) 276(H)   LDLCALC 0 - 99 mg/dL - - 166(H) 169(H) 190(H)   LDLDIRECT mg/dL 192.6 174.0 - - -   HDL >39 mg/dL 53.60 48.20 49 44(L) 49   Trlycerides 0 - 149 mg/dL 115.0 123.0  152(H) 215(H) 185(H)   Hemoglobin A1c <5.7 % - - - - -      Capillary Blood Glucose: Lab Results  Component Value Date   GLUCAP 101 (H) 06/17/2018     Exercise Target Goals: Exercise Program Goal: Individual exercise prescription set using results from initial 6 min walk test and THRR while considering  patient's activity barriers and safety.   Exercise Prescription Goal: Initial exercise prescription builds to 30-45 minutes a day of aerobic activity, 2-3 days per week.  Home exercise guidelines will be given to patient during program as part of exercise prescription that the participant will acknowledge.  Activity Barriers & Risk Stratification: Activity Barriers & Cardiac Risk Stratification - 08/01/18 1157      Activity Barriers & Cardiac Risk Stratification   Activity Barriers  None    Cardiac Risk Stratification  High       6 Minute Walk: 6 Minute Walk    Row Name 08/01/18 1156         6 Minute Walk   Phase  Initial     Distance  1685 feet     Distance % Change  0 %     Distance Feet Change  0 ft     Walk Time  6 minutes     # of Rest Breaks  0     MPH  3.2     METS  3.21     RPE  11     Perceived Dyspnea   0     VO2 Peak  11.22     Symptoms  No     Resting HR  66 bpm     Resting BP  128/72     Resting Oxygen Saturation   99 %     Exercise Oxygen Saturation  during 6 min walk  99 %     Max Ex. HR  86 bpm     Max Ex. BP  130/80     2 Minute Post BP  122/68        Oxygen Initial Assessment:   Oxygen Re-Evaluation:   Oxygen Discharge (Final Oxygen Re-Evaluation):   Initial Exercise Prescription: Initial Exercise Prescription - 08/01/18 1100      Date of Initial Exercise RX and Referring Provider   Date  08/01/18    Referring Provider  Nahser, Wonda Cheng MD    Expected Discharge Date  11/06/18      Recumbant Bike   Level  2    Watts  25    Minutes  10    METs  3.21      NuStep   Level  2    SPM  85    Minutes  10  METs  3       Track   Laps  13    Minutes  10    METs  3.3      Prescription Details   Frequency (times per week)  3x    Duration  Progress to 30 minutes of continuous aerobic without signs/symptoms of physical distress      Intensity   THRR 40-80% of Max Heartrate  59-118    Ratings of Perceived Exertion  11-13    Perceived Dyspnea  0-4      Progression   Progression  Continue progressive overload as per policy without signs/symptoms or physical distress.      Resistance Training   Training Prescription  Yes    Weight  3lbs    Reps  10-15       Perform Capillary Blood Glucose checks as needed.  Exercise Prescription Changes:   Exercise Comments:   Exercise Goals and Review: Exercise Goals    Row Name 08/01/18 1158             Exercise Goals   Increase Physical Activity  Yes       Intervention  Provide advice, education, support and counseling about physical activity/exercise needs.;Develop an individualized exercise prescription for aerobic and resistive training based on initial evaluation findings, risk stratification, comorbidities and participant's personal goals.       Expected Outcomes  Short Term: Attend rehab on a regular basis to increase amount of physical activity.;Long Term: Add in home exercise to make exercise part of routine and to increase amount of physical activity.;Long Term: Exercising regularly at least 3-5 days a week.       Increase Strength and Stamina  Yes       Intervention  Provide advice, education, support and counseling about physical activity/exercise needs.;Develop an individualized exercise prescription for aerobic and resistive training based on initial evaluation findings, risk stratification, comorbidities and participant's personal goals.       Expected Outcomes  Short Term: Increase workloads from initial exercise prescription for resistance, speed, and METs.;Short Term: Perform resistance training exercises routinely during rehab and add in  resistance training at home;Long Term: Improve cardiorespiratory fitness, muscular endurance and strength as measured by increased METs and functional capacity (6MWT)       Able to understand and use rate of perceived exertion (RPE) scale  Yes       Intervention  Provide education and explanation on how to use RPE scale       Expected Outcomes  Short Term: Able to use RPE daily in rehab to express subjective intensity level;Long Term:  Able to use RPE to guide intensity level when exercising independently       Knowledge and understanding of Target Heart Rate Range (THRR)  Yes       Intervention  Provide education and explanation of THRR including how the numbers were predicted and where they are located for reference       Expected Outcomes  Short Term: Able to state/look up THRR;Short Term: Able to use daily as guideline for intensity in rehab;Long Term: Able to use THRR to govern intensity when exercising independently       Able to check pulse independently  Yes       Intervention  Provide education and demonstration on how to check pulse in carotid and radial arteries.;Review the importance of being able to check your own pulse for safety during independent exercise       Expected  Outcomes  Short Term: Able to explain why pulse checking is important during independent exercise;Long Term: Able to check pulse independently and accurately       Understanding of Exercise Prescription  Yes       Intervention  Provide education, explanation, and written materials on patient's individual exercise prescription       Expected Outcomes  Short Term: Able to explain program exercise prescription;Long Term: Able to explain home exercise prescription to exercise independently          Exercise Goals Re-Evaluation :   Discharge Exercise Prescription (Final Exercise Prescription Changes):   Nutrition:  Target Goals: Understanding of nutrition guidelines, daily intake of sodium 1500mg , cholesterol  200mg , calories 30% from fat and 7% or less from saturated fats, daily to have 5 or more servings of fruits and vegetables.  Biometrics: Pre Biometrics - 08/01/18 1157      Pre Biometrics   Height  5' (1.524 m)    Weight  62.4 kg    Waist Circumference  31 inches    Hip Circumference  39 inches    Waist to Hip Ratio  0.79 %    BMI (Calculated)  26.87    Triceps Skinfold  26 mm    % Body Fat  37.6 %    Grip Strength  29 kg    Flexibility  16.5 in    Single Leg Stand  30 seconds        Nutrition Therapy Plan and Nutrition Goals:   Nutrition Assessments:   Nutrition Goals Re-Evaluation:   Nutrition Goals Re-Evaluation:   Nutrition Goals Discharge (Final Nutrition Goals Re-Evaluation):   Psychosocial: Target Goals: Acknowledge presence or absence of significant depression and/or stress, maximize coping skills, provide positive support system. Participant is able to verbalize types and ability to use techniques and skills needed for reducing stress and depression.  Initial Review & Psychosocial Screening: Initial Psych Review & Screening - 08/01/18 1056      Initial Review   Current issues with  None Identified      Family Dynamics   Good Support System?  Yes   Pt lists her spouse, family, and friends as sources of support.      Barriers   Psychosocial barriers to participate in program  There are no identifiable barriers or psychosocial needs.      Screening Interventions   Interventions  Encouraged to exercise       Quality of Life Scores: Quality of Life - 08/01/18 1104      Quality of Life   Select  Quality of Life      Quality of Life Scores   Health/Function Pre  17.7 %    Socioeconomic Pre  27.5 %    Psych/Spiritual Pre  23.14 %    Family Pre  26.4 %    GLOBAL Pre  22.12 %      Scores of 19 and below usually indicate a poorer quality of life in these areas.  A difference of  2-3 points is a clinically meaningful difference.  A difference of  2-3 points in the total score of the Quality of Life Index has been associated with significant improvement in overall quality of life, self-image, physical symptoms, and general health in studies assessing change in quality of life.  PHQ-9: Recent Review Flowsheet Data    Depression screen [facility] 2/9 04/30/2018 05/02/2017 07/10/2016 12/04/2012 09/02/2012   Decreased Interest 0 0 0 0 0   Down, Depressed, Hopeless 0  0 0 0 0   PHQ - 2 Score 0 0 0 0 0   Altered sleeping 0 - 0 - -   Tired, decreased energy 0 - 0 - -   Change in appetite 0 - 0 - -   Feeling bad or failure about yourself  0 - 0 - -   Trouble concentrating 0 - 0 - -   Moving slowly or fidgety/restless 0 - 0 - -   Suicidal thoughts 0 - 0 - -   PHQ-9 Score 0 - 0 - -   Difficult doing work/chores Not difficult at all - - - -     Interpretation of Total Score  Total Score Depression Severity:  1-4 = Minimal depression, 5-9 = Mild depression, 10-14 = Moderate depression, 15-19 = Moderately severe depression, 20-27 = Severe depression   Psychosocial Evaluation and Intervention:   Psychosocial Re-Evaluation:   Psychosocial Discharge (Final Psychosocial Re-Evaluation):   Vocational Rehabilitation: Provide vocational rehab assistance to qualifying candidates.   Vocational Rehab Evaluation & Intervention: Vocational Rehab - 08/01/18 1215      Initial Vocational Rehab Evaluation & Intervention   Assessment shows need for Vocational Rehabilitation  No       Education: Education Goals: Education classes will be provided on a weekly basis, covering required topics. Participant will state understanding/return demonstration of topics presented.  Learning Barriers/Preferences: Learning Barriers/Preferences - 08/01/18 1201      Learning Barriers/Preferences   Learning Barriers  Sight    Learning Preferences  Skilled Demonstration;Written Material;Individual Instruction       Education Topics: Count Your Pulse:  -Group  instruction provided by verbal instruction, demonstration, patient participation and written materials to support subject.  Instructors address importance of being able to find your pulse and how to count your pulse when at home without a heart monitor.  Patients get hands on experience counting their pulse with staff help and individually.   Heart Attack, Angina, and Risk Factor Modification:  -Group instruction provided by verbal instruction, video, and written materials to support subject.  Instructors address signs and symptoms of angina and heart attacks.    Also discuss risk factors for heart disease and how to make changes to improve heart health risk factors.   Functional Fitness:  -Group instruction provided by verbal instruction, demonstration, patient participation, and written materials to support subject.  Instructors address safety measures for doing things around the house.  Discuss how to get up and down off the floor, how to pick things up properly, how to safely get out of a chair without assistance, and balance training.   Meditation and Mindfulness:  -Group instruction provided by verbal instruction, patient participation, and written materials to support subject.  Instructor addresses importance of mindfulness and meditation practice to help reduce stress and improve awareness.  Instructor also leads participants through a meditation exercise.    Stretching for Flexibility and Mobility:  -Group instruction provided by verbal instruction, patient participation, and written materials to support subject.  Instructors lead participants through series of stretches that are designed to increase flexibility thus improving mobility.  These stretches are additional exercise for major muscle groups that are typically performed during regular warm up and cool down.   Hands Only CPR:  -Group verbal, video, and participation provides a basic overview of AHA guidelines for community CPR.  Role-play of emergencies allow participants the opportunity to practice calling for help and chest compression technique with discussion of AED use.   Hypertension: -Group  verbal and written instruction that provides a basic overview of hypertension including the most recent diagnostic guidelines, risk factor reduction with self-care instructions and medication management.    Nutrition I class: Heart Healthy Eating:  -Group instruction provided by PowerPoint slides, verbal discussion, and written materials to support subject matter. The instructor gives an explanation and review of the Therapeutic Lifestyle Changes diet recommendations, which includes a discussion on lipid goals, dietary fat, sodium, fiber, plant stanol/sterol esters, sugar, and the components of a well-balanced, healthy diet.   Nutrition II class: Lifestyle Skills:  -Group instruction provided by PowerPoint slides, verbal discussion, and written materials to support subject matter. The instructor gives an explanation and review of label reading, grocery shopping for heart health, heart healthy recipe modifications, and ways to make healthier choices when eating out.   Diabetes Question & Answer:  -Group instruction provided by PowerPoint slides, verbal discussion, and written materials to support subject matter. The instructor gives an explanation and review of diabetes co-morbidities, pre- and post-prandial blood glucose goals, pre-exercise blood glucose goals, signs, symptoms, and treatment of hypoglycemia and hyperglycemia, and foot care basics.   Diabetes Blitz:  -Group instruction provided by PowerPoint slides, verbal discussion, and written materials to support subject matter. The instructor gives an explanation and review of the physiology behind type 1 and type 2 diabetes, diabetes medications and rational behind using different medications, pre- and post-prandial blood glucose recommendations and Hemoglobin A1c goals,  diabetes diet, and exercise including blood glucose guidelines for exercising safely.    Portion Distortion:  -Group instruction provided by PowerPoint slides, verbal discussion, written materials, and food models to support subject matter. The instructor gives an explanation of serving size versus portion size, changes in portions sizes over the last 20 years, and what consists of a serving from each food group.   Stress Management:  -Group instruction provided by verbal instruction, video, and written materials to support subject matter.  Instructors review role of stress in heart disease and how to cope with stress positively.     Exercising on Your Own:  -Group instruction provided by verbal instruction, power point, and written materials to support subject.  Instructors discuss benefits of exercise, components of exercise, frequency and intensity of exercise, and end points for exercise.  Also discuss use of nitroglycerin and activating EMS.  Review options of places to exercise outside of rehab.  Review guidelines for sex with heart disease.   Cardiac Drugs I:  -Group instruction provided by verbal instruction and written materials to support subject.  Instructor reviews cardiac drug classes: antiplatelets, anticoagulants, beta blockers, and statins.  Instructor discusses reasons, side effects, and lifestyle considerations for each drug class.   Cardiac Drugs II:  -Group instruction provided by verbal instruction and written materials to support subject.  Instructor reviews cardiac drug classes: angiotensin converting enzyme inhibitors (ACE-I), angiotensin II receptor blockers (ARBs), nitrates, and calcium channel blockers.  Instructor discusses reasons, side effects, and lifestyle considerations for each drug class.   Anatomy and Physiology of the Circulatory System:  Group verbal and written instruction and models provide basic cardiac anatomy and physiology, with the coronary  electrical and arterial systems. Review of: AMI, Angina, Valve disease, Heart Failure, Peripheral Artery Disease, Cardiac Arrhythmia, Pacemakers, and the ICD.   Other Education:  -Group or individual verbal, written, or video instructions that support the educational goals of the cardiac rehab program.   Holiday Eating Survival Tips:  -Group instruction provided by PowerPoint slides, verbal discussion, and written  materials to support subject matter. The instructor gives patients tips, tricks, and techniques to help them not only survive but enjoy the holidays despite the onslaught of food that accompanies the holidays.   Knowledge Questionnaire Score: Knowledge Questionnaire Score - 08/01/18 1108      Knowledge Questionnaire Score   Pre Score  20/24       Core Components/Risk Factors/Patient Goals at Admission: Personal Goals and Risk Factors at Admission - 08/01/18 1202      Core Components/Risk Factors/Patient Goals on Admission    Weight Management  Yes;Weight Maintenance    Intervention  Weight Management: Develop a combined nutrition and exercise program designed to reach desired caloric intake, while maintaining appropriate intake of nutrient and fiber, sodium and fats, and appropriate energy expenditure required for the weight goal.;Weight Management: Provide education and appropriate resources to help participant work on and attain dietary goals.;Weight Management/Obesity: Establish reasonable short term and long term weight goals.    Admit Weight  137 lb 9.1 oz (62.4 kg)    Expected Outcomes  Short Term: Continue to assess and modify interventions until short term weight is achieved;Long Term: Adherence to nutrition and physical activity/exercise program aimed toward attainment of established weight goal;Weight Maintenance: Understanding of the daily nutrition guidelines, which includes 25-35% calories from fat, 7% or less cal from saturated fats, less than 200mg  cholesterol, less  than 1.5gm of sodium, & 5 or more servings of fruits and vegetables daily;Understanding recommendations for meals to include 15-35% energy as protein, 25-35% energy from fat, 35-60% energy from carbohydrates, less than 200mg  of dietary cholesterol, 20-35 gm of total fiber daily;Understanding of distribution of calorie intake throughout the day with the consumption of 4-5 meals/snacks    Hypertension  Yes    Intervention  Provide education on lifestyle modifcations including regular physical activity/exercise, weight management, moderate sodium restriction and increased consumption of fresh fruit, vegetables, and low fat dairy, alcohol moderation, and smoking cessation.;Monitor prescription use compliance.    Expected Outcomes  Short Term: Continued assessment and intervention until BP is < 140/58mm HG in hypertensive participants. < 130/60mm HG in hypertensive participants with diabetes, heart failure or chronic kidney disease.;Long Term: Maintenance of blood pressure at goal levels.    Lipids  Yes    Intervention  Provide education and support for participant on nutrition & aerobic/resistive exercise along with prescribed medications to achieve LDL 70mg , HDL >40mg .    Expected Outcomes  Short Term: Participant states understanding of desired cholesterol values and is compliant with medications prescribed. Participant is following exercise prescription and nutrition guidelines.;Long Term: Cholesterol controlled with medications as prescribed, with individualized exercise RX and with personalized nutrition plan. Value goals: LDL < 70mg , HDL > 40 mg.    Stress  Yes    Intervention  Offer individual and/or small group education and counseling on adjustment to heart disease, stress management and health-related lifestyle change. Teach and support self-help strategies.;Refer participants experiencing significant psychosocial distress to appropriate mental health specialists for further evaluation and treatment.  When possible, include family members and significant others in education/counseling sessions.    Expected Outcomes  Short Term: Participant demonstrates changes in health-related behavior, relaxation and other stress management skills, ability to obtain effective social support, and compliance with psychotropic medications if prescribed.;Long Term: Emotional wellbeing is indicated by absence of clinically significant psychosocial distress or social isolation.       Core Components/Risk Factors/Patient Goals Review:    Core Components/Risk Factors/Patient Goals at Discharge (Final Review):  ITP Comments: ITP Comments    Row Name 08/01/18 1156           ITP Comments  Dr. Fransico Him, Medical Director          Comments: Patient attended orientation from 0827 to 1002 to review rules and guidelines for program. Completed 6 minute walk test, Intitial ITP, and exercise prescription.  VSS. Telemetry-SB/SR.  Asymptomatic.

## 2018-08-02 NOTE — Progress Notes (Signed)
Shannon Cummings 73 y.o. female DOB: [birth year] MRN: [id number]      Nutrition Note  1. NSTEMI (non-ST elevated myocardial infarction) ([place])   2. Status post coronary artery stent placement    Past Medical History:  Diagnosis Date  . Arthritis   . CAD (coronary artery disease)    a. prior stenting history. b. NSTEMI s/p DES to prox LAD with EF 45% by cath 08/14/12. c. Mild trop elevation shortly after cath ?mild plaque embolization - patent stent on relook. // d. Myoview 8/18: EF 60, normal perfusion; Low Risk. e. NSTEMI 06/2018- patent prior LAD stent, 80% OM1 s/p DES, normal LVEDP, EF 45-50%, moderate residual disease treated medically.  . Fatty infiltration of liver   . GERD (gastroesophageal reflux disease)   . Hyperlipidemia   . Hypertension   . Hypokalemia   . Hypotension   . Myocardial infarction ([place]) 2014  . OSA on CPAP   . Osteoporosis   . PAF (paroxysmal atrial fibrillation) ([place])    On rythmol previously  . Pulmonary nodule    a. 43mm subpleural nodular density CT 08/2012, instructed to f/u pulm MD.  . Sinus bradycardia   . Tubular adenoma of colon 2007   Meds reviewed.    Current Outpatient Medications (Cardiovascular):  .  amiodarone (PACERONE) 200 MG tablet, Take 1 tablet (200 mg total) by mouth daily. Marland Kitchen  losartan (COZAAR) 25 MG tablet, Take 2 tablets (50 mg total) by mouth daily. .  nitroGLYCERIN (NITROSTAT) 0.4 MG SL tablet, Place 1 tablet (0.4 mg total) under the tongue every 5 (five) minutes as needed for chest pain (up to 3 doses). .  propranolol (INDERAL) 10 MG tablet, Take 1 tablet (10 mg total) by mouth 4 (four) times daily as needed. Marland Kitchen  spironolactone (ALDACTONE) 25 MG tablet, Take 0.5 tablets (12.5 mg total) by mouth daily.  Current Outpatient Medications (Respiratory):  .  fluticasone (FLONASE) 50 MCG/ACT nasal spray, Place 2 sprays into both nostrils daily as needed for allergies or rhinitis.  Current Outpatient Medications (Analgesics):  .   acetaminophen (TYLENOL) 325 MG tablet, Take 325-650 mg by mouth every 8 (eight) hours as needed (for pain).   Current Outpatient Medications (Hematological):  .  clopidogrel (PLAVIX) 75 MG tablet, Take 1 tablet (75 mg total) by mouth daily. .  Cyanocobalamin (B-12) 1000 MCG LOZG, Take 1,000 mcg by mouth 2 (two) times daily.  Marland Kitchen  ELIQUIS 5 MG TABS tablet, TAKE 1 TABLET(5 MG) BY MOUTH TWICE DAILY  Current Outpatient Medications (Other):  Marland Kitchen  AMBULATORY NON FORMULARY MEDICATION, Take 180 mg by mouth daily. Medication Name: bempedoic acid 180 mg vs a placebo.  CLEAR Research Study, drug provided .  Cholecalciferol (VITAMIN D3) 25 MCG (1000 UT) CAPS, Take 1,000 Units by mouth daily. .  pantoprazole (PROTONIX) 40 MG tablet, Take 40 mg by mouth daily. .  potassium chloride (K-DUR) 10 MEQ tablet, Take 1 tablet (10 mEq total) by mouth daily. .  temazepam (RESTORIL) 15 MG capsule, Take 15 mg by mouth at bedtime as needed for sleep.  HT: Ht Readings from Last 1 Encounters:  08/01/18 5' (1.524 m)    WT: Wt Readings from Last 5 Encounters:  08/01/18 137 lb 9.1 oz (62.4 kg)  07/31/18 137 lb 1.9 oz (62.2 kg)  07/23/18 134 lb (60.8 kg)  07/15/18 139 lb (63 kg)  06/26/18 139 lb (63 kg)     Body mass index is 26.87 kg/m.   Current tobacco use? No  Labs:  Lipid Panel     Component Value Date/Time   CHOL 276 (H) 07/31/2018 0937   TRIG 185 (H) 07/31/2018 0937   HDL 49 07/31/2018 0937   CHOLHDL 5.6 (H) 07/31/2018 0937   CHOLHDL 5.8 (H) 06/23/2015 1153   VLDL 43 (H) 06/23/2015 1153   LDLCALC 190 (H) 07/31/2018 0937   LDLDIRECT 174.0 12/31/2012 0754    Lab Results  Component Value Date   HGBA1C 5.7 (H) 08/14/2012   CBG (last 3)  No results for input(s): GLUCAP in the last 72 hours.  Nutrition Note Spoke with pt. Nutrition plan and goals reviewed with pt. Pt is following Step 2 of the Therapeutic Lifestyle Changes diet. Pt wants to lose wt. Pt has been trying to lose wt. Heart healthy, carb  modified weight loss tips reviewed (label reading, how to build a healthy plate, portion sizes, eating frequently across the day). Pt last A1C was elevated. Discussed the differences between complex and refined carbs, recommended pt replace refined carbs with complex. Reviewed the benefits swapping in complex carbs and moderating portion sizes can have on managing blood glucose with patient. Per discussion, pt does not use canned/convenience foods often. Pt does not add salt to food. Pt does not eat out frequently. Pt cooks the majority of meals at home, eats in a mediterranean style, and uses low sodium seasoning. Pt expressed understanding of the information reviewed. Pt aware of nutrition education classes offered and would like to attend nutrition classes.  Nutrition Diagnosis ? Food-and nutrition-related knowledge deficit related to lack of exposure to information as related to diagnosis of: ? CVD ? Pre-diabetes ? Overweight  related to excessive energy intake as evidenced by a Body mass index is 26.87 kg/m.  Nutrition Intervention ? Pt's individual nutrition plan and goals reviewed with pt. ? Pt given handouts for: ? Nutrition I class ? Nutrition II class ? mindful eating exercises  Nutrition Goal(s):  ? Pt to identify food quantities necessary to achieve weight loss of 6-24 lb at graduation from cardiac rehab.  ? Pt to build a healthy plate including vegetables, fruits, whole grains, and low-fat dairy products in a heart healthy meal plan. ? Pt to weigh and measure serving sizes ? Pt to practice mindful and intuitive eating exercises  Plan:  ? Pt to attend nutrition classes:  ? Nutrition I ? Nutrition II ? Portion Distortion  ? Will provide client-centered nutrition education as part of interdisciplinary care ? Monitor and evaluate progress toward nutrition goal with team.   Laurina Bustle, MS, RD, LDN 08/02/2018 8:54 AM

## 2018-08-05 ENCOUNTER — Ambulatory Visit (HOSPITAL_COMMUNITY): Payer: Medicare Other

## 2018-08-05 ENCOUNTER — Encounter (HOSPITAL_COMMUNITY)
Admission: RE | Admit: 2018-08-05 | Discharge: 2018-08-05 | Disposition: A | Discharge status: Home / Self Care | Payer: Medicare Other | Source: Ambulatory Visit | Attending: Cardiovascular Disease | Admitting: Cardiovascular Disease

## 2018-08-05 DIAGNOSIS — Z955 Presence of coronary angioplasty implant and graft: Secondary | ICD-10-CM | POA: Insufficient documentation

## 2018-08-05 DIAGNOSIS — I214 Non-ST elevation (NSTEMI) myocardial infarction: Secondary | ICD-10-CM | POA: Diagnosis not present

## 2018-08-05 NOTE — Progress Notes (Signed)
Daily Session Note  Patient Details  Name: Shannon Cummings MRN: [id number] Date of Birth: [birth year] Referring Provider:     CARDIAC REHAB PHASE II ORIENTATION from 08/01/2018 in [place]  Referring Provider  Nahser, Wonda Cheng MD      Encounter Date: 08/05/2018  Check In: Session Check In - 08/05/18 1201      Check-In   Supervising physician immediately available to respond to emergencies  [facility] immediately available    Physician(s)  Dr. Maylene Roes    Location  [facility]    Staff Present  Jiles Garter, RN, BSN;Brittany Durene Fruits, BS, ACSM CEP, Exercise Physiologist;Tyara Carol Ada, MS,ACSM CEP, Exercise Physiologist;Joann Rion, RN, Bjorn Loser, MS, Exercise Physiologist    Medication changes reported      No    Fall or balance concerns reported     No    Tobacco Cessation  No Change    Warm-up and Cool-down  Performed as group-led instruction    Resistance Training Performed  Yes    VAD Patient?  No    PAD/SET Patient?  No      Pain Assessment   Currently in Pain?  No/denies    Multiple Pain Sites  No       Capillary Blood Glucose: No results found for this or any previous visit (from the past 24 hour(s)).    Social History   Tobacco Use  Smoking Status Never Smoker  Smokeless Tobacco Never Used    Goals Met:  Exercise tolerated well  Goals Unmet:  Not Applicable  Comments: Pt started cardiac rehab today.  Pt tolerated light exercise without difficulty. VSS, telemetry-SR, asymptomatic.  Medication list reconciled. Pt denies barriers to medicaiton compliance.  PSYCHOSOCIAL ASSESSMENT:  PHQ-0. Pt exhibits positive coping skills, hopeful outlook with supportive family. No psychosocial needs identified at this time, no psychosocial interventions necessary.  Pt oriented to exercise equipment and routine.    Understanding verbalized.   Dr. Fransico Him is Medical Director for Cardiac Rehab at [facility].

## 2018-08-07 ENCOUNTER — Ambulatory Visit (HOSPITAL_COMMUNITY): Payer: Medicare Other

## 2018-08-07 ENCOUNTER — Encounter (HOSPITAL_COMMUNITY)
Admission: RE | Admit: 2018-08-07 | Discharge: 2018-08-07 | Disposition: A | Discharge status: Home / Self Care | Payer: Medicare Other | Source: Ambulatory Visit | Attending: Cardiovascular Disease | Admitting: Cardiovascular Disease

## 2018-08-07 DIAGNOSIS — Z955 Presence of coronary angioplasty implant and graft: Secondary | ICD-10-CM

## 2018-08-07 DIAGNOSIS — I214 Non-ST elevation (NSTEMI) myocardial infarction: Secondary | ICD-10-CM | POA: Diagnosis not present

## 2018-08-08 NOTE — Progress Notes (Signed)
Cardiac Individual Treatment Plan  Patient Details  Name: Shannon Cummings MRN: [id number] Date of Birth: 01/31/1946 Referring Provider:     CARDIAC REHAB PHASE II ORIENTATION from 08/01/2018 in [facility]  Referring Provider  Nahser, Wonda Cheng MD      Initial Encounter Date:    CARDIAC REHAB PHASE II ORIENTATION from 08/01/2018 in [facility]  Date  08/01/18      Visit Diagnosis: NSTEMI (non-ST elevated myocardial infarction) [facility])  Status post coronary artery stent placement  Patient's Home Medications on Admission:  Current Outpatient Medications:  .  acetaminophen (TYLENOL) 325 MG tablet, Take 325-650 mg by mouth every 8 (eight) hours as needed (for pain). , Disp: , Rfl:  .  AMBULATORY NON FORMULARY MEDICATION, Take 180 mg by mouth daily. Medication Name: bempedoic acid 180 mg vs a placebo.  CLEAR Research Study, drug provided, Disp: , Rfl:  .  amiodarone (PACERONE) 200 MG tablet, Take 1 tablet (200 mg total) by mouth daily., Disp: 30 tablet, Rfl: 6 .  Cholecalciferol (VITAMIN D3) 25 MCG (1000 UT) CAPS, Take 1,000 Units by mouth daily., Disp: , Rfl:  .  clopidogrel (PLAVIX) 75 MG tablet, Take 1 tablet (75 mg total) by mouth daily., Disp: 30 tablet, Rfl: 11 .  Cyanocobalamin (B-12) 1000 MCG LOZG, Take 1,000 mcg by mouth 2 (two) times daily. , Disp: , Rfl:  .  ELIQUIS 5 MG TABS tablet, TAKE 1 TABLET(5 MG) BY MOUTH TWICE DAILY, Disp: 180 tablet, Rfl: 2 .  fluticasone (FLONASE) 50 MCG/ACT nasal spray, Place 2 sprays into both nostrils daily as needed for allergies or rhinitis., Disp: , Rfl:  .  losartan (COZAAR) 25 MG tablet, Take 2 tablets (50 mg total) by mouth daily., Disp: 60 tablet, Rfl: 11 .  nitroGLYCERIN (NITROSTAT) 0.4 MG SL tablet, Place 1 tablet (0.4 mg total) under the tongue every 5 (five) minutes as needed for chest pain (up to 3 doses)., Disp: 25 tablet, Rfl: 6 .  pantoprazole (PROTONIX) 40 MG tablet, Take 40  mg by mouth daily., Disp: , Rfl:  .  potassium chloride (K-DUR) 10 MEQ tablet, Take 1 tablet (10 mEq total) by mouth daily., Disp: 90 tablet, Rfl: 3 .  propranolol (INDERAL) 10 MG tablet, Take 1 tablet (10 mg total) by mouth 4 (four) times daily as needed., Disp: 60 tablet, Rfl: 11 .  spironolactone (ALDACTONE) 25 MG tablet, Take 0.5 tablets (12.5 mg total) by mouth daily., Disp: 45 tablet, Rfl: 3 .  temazepam (RESTORIL) 15 MG capsule, Take 15 mg by mouth at bedtime as needed for sleep., Disp: , Rfl:   Past Medical History: Past Medical History:  Diagnosis Date  . Arthritis   . CAD (coronary artery disease)    a. prior stenting history. b. NSTEMI s/p DES to prox LAD with EF 45% by cath 08/14/12. c. Mild trop elevation shortly after cath ?mild plaque embolization - patent stent on relook. // d. Myoview 8/18: EF 60, normal perfusion; Low Risk. e. NSTEMI 06/2018- patent prior LAD stent, 80% OM1 s/p DES, normal LVEDP, EF 45-50%, moderate residual disease treated medically.  . Fatty infiltration of liver   . GERD (gastroesophageal reflux disease)   . Hyperlipidemia   . Hypertension   . Hypokalemia   . Hypotension   . Myocardial infarction ([place]) 2014  . OSA on CPAP   . Osteoporosis   . PAF (paroxysmal atrial fibrillation) ([place])    On rythmol previously  .  Pulmonary nodule    a. 70mm subpleural nodular density CT 08/2012, instructed to f/u pulm MD.  . Sinus bradycardia   . Tubular adenoma of colon 2007    Tobacco Use: Social History   Tobacco Use  Smoking Status Never Smoker  Smokeless Tobacco Never Used    Labs: Recent Review Flowsheet Data    Labs for ITP Cardiac and Pulmonary Rehab Latest Ref Rng & Units 10/02/2012 12/31/2012 07/29/2013 06/23/2015 07/31/2018   Cholestrol 100 - 199 mg/dL 263(H) 251(H) 245(H) 256(H) 276(H)   LDLCALC 0 - 99 mg/dL - - 166(H) 169(H) 190(H)   LDLDIRECT mg/dL 192.6 174.0 - - -   HDL >39 mg/dL 53.60 48.20 49 44(L) 49   Trlycerides 0 - 149 mg/dL 115.0 123.0  152(H) 215(H) 185(H)   Hemoglobin A1c <5.7 % - - - - -      Capillary Blood Glucose: Lab Results  Component Value Date   GLUCAP 101 (H) 06/17/2018     Exercise Target Goals: Exercise Program Goal: Individual exercise prescription set using results from initial 6 min walk test and THRR while considering  patient's activity barriers and safety.   Exercise Prescription Goal: Initial exercise prescription builds to 30-45 minutes a day of aerobic activity, 2-3 days per week.  Home exercise guidelines will be given to patient during program as part of exercise prescription that the participant will acknowledge.  Activity Barriers & Risk Stratification: Activity Barriers & Cardiac Risk Stratification - 08/01/18 1157      Activity Barriers & Cardiac Risk Stratification   Activity Barriers  None    Cardiac Risk Stratification  High       6 Minute Walk: 6 Minute Walk    Row Name 08/01/18 1156         6 Minute Walk   Phase  Initial     Distance  1685 feet     Distance % Change  0 %     Distance Feet Change  0 ft     Walk Time  6 minutes     # of Rest Breaks  0     MPH  3.2     METS  3.21     RPE  11     Perceived Dyspnea   0     VO2 Peak  11.22     Symptoms  No     Resting HR  66 bpm     Resting BP  128/72     Resting Oxygen Saturation   99 %     Exercise Oxygen Saturation  during 6 min walk  99 %     Max Ex. HR  86 bpm     Max Ex. BP  130/80     2 Minute Post BP  122/68        Oxygen Initial Assessment:   Oxygen Re-Evaluation:   Oxygen Discharge (Final Oxygen Re-Evaluation):   Initial Exercise Prescription: Initial Exercise Prescription - 08/01/18 1100      Date of Initial Exercise RX and Referring Provider   Date  08/01/18    Referring Provider  Nahser, Wonda Cheng MD    Expected Discharge Date  11/06/18      Recumbant Bike   Level  2    Watts  25    Minutes  10    METs  3.21      NuStep   Level  2    SPM  85    Minutes  10  METs  3       Track   Laps  13    Minutes  10    METs  3.3      Prescription Details   Frequency (times per week)  3x    Duration  Progress to 30 minutes of continuous aerobic without signs/symptoms of physical distress      Intensity   THRR 40-80% of Max Heartrate  59-118    Ratings of Perceived Exertion  11-13    Perceived Dyspnea  0-4      Progression   Progression  Continue progressive overload as per policy without signs/symptoms or physical distress.      Resistance Training   Training Prescription  Yes    Weight  3lbs    Reps  10-15       Perform Capillary Blood Glucose checks as needed.  Exercise Prescription Changes: Exercise Prescription Changes    Row Name 08/05/18 1500             Response to Exercise   Blood Pressure (Admit)  122/90       Blood Pressure (Exercise)  128/74       Blood Pressure (Exit)  120/80       Heart Rate (Admit)  71 bpm       Heart Rate (Exercise)  89 bpm       Heart Rate (Exit)  71 bpm       Rating of Perceived Exertion (Exercise)  12       Perceived Dyspnea (Exercise)  0       Symptoms  None       Comments  Pt oriented to exercise equipment       Duration  Progress to 30 minutes of  aerobic without signs/symptoms of physical distress       Intensity  THRR unchanged         Progression   Progression  Continue to progress workloads to maintain intensity without signs/symptoms of physical distress.       Average METs  3.28         Resistance Training   Training Prescription  Yes       Weight  3lbs       Reps  10-15       Time  10 Minutes         Recumbant Bike   Level  2       Minutes  10       METs  2.8         NuStep   Level  2       SPM  85       Minutes  10       METs  2.8         Track   Laps  16       Minutes  10       METs  3.76          Exercise Comments: Exercise Comments    Row Name 08/05/18 1508           Exercise Comments  Pt's first day of exercise. Pt responded well to workloads. Will continue to  monitor and progress pt as tolerated.           Exercise Goals and Review: Exercise Goals    Row Name 08/01/18 1158             Exercise Goals   Increase Physical Activity  Yes       Intervention  Provide advice, education, support and counseling about physical activity/exercise needs.;Develop an individualized exercise prescription for aerobic and resistive training based on initial evaluation findings, risk stratification, comorbidities and participant's personal goals.       Expected Outcomes  Short Term: Attend rehab on a regular basis to increase amount of physical activity.;Long Term: Add in home exercise to make exercise part of routine and to increase amount of physical activity.;Long Term: Exercising regularly at least 3-5 days a week.       Increase Strength and Stamina  Yes       Intervention  Provide advice, education, support and counseling about physical activity/exercise needs.;Develop an individualized exercise prescription for aerobic and resistive training based on initial evaluation findings, risk stratification, comorbidities and participant's personal goals.       Expected Outcomes  Short Term: Increase workloads from initial exercise prescription for resistance, speed, and METs.;Short Term: Perform resistance training exercises routinely during rehab and add in resistance training at home;Long Term: Improve cardiorespiratory fitness, muscular endurance and strength as measured by increased METs and functional capacity (6MWT)       Able to understand and use rate of perceived exertion (RPE) scale  Yes       Intervention  Provide education and explanation on how to use RPE scale       Expected Outcomes  Short Term: Able to use RPE daily in rehab to express subjective intensity level;Long Term:  Able to use RPE to guide intensity level when exercising independently       Knowledge and understanding of Target Heart Rate Range (THRR)  Yes       Intervention  Provide education  and explanation of THRR including how the numbers were predicted and where they are located for reference       Expected Outcomes  Short Term: Able to state/look up THRR;Short Term: Able to use daily as guideline for intensity in rehab;Long Term: Able to use THRR to govern intensity when exercising independently       Able to check pulse independently  Yes       Intervention  Provide education and demonstration on how to check pulse in carotid and radial arteries.;Review the importance of being able to check your own pulse for safety during independent exercise       Expected Outcomes  Short Term: Able to explain why pulse checking is important during independent exercise;Long Term: Able to check pulse independently and accurately       Understanding of Exercise Prescription  Yes       Intervention  Provide education, explanation, and written materials on patient's individual exercise prescription       Expected Outcomes  Short Term: Able to explain program exercise prescription;Long Term: Able to explain home exercise prescription to exercise independently          Exercise Goals Re-Evaluation :   Discharge Exercise Prescription (Final Exercise Prescription Changes): Exercise Prescription Changes - 08/05/18 1500      Response to Exercise   Blood Pressure (Admit)  122/90    Blood Pressure (Exercise)  128/74    Blood Pressure (Exit)  120/80    Heart Rate (Admit)  71 bpm    Heart Rate (Exercise)  89 bpm    Heart Rate (Exit)  71 bpm    Rating of Perceived Exertion (Exercise)  12    Perceived Dyspnea (Exercise)  0    Symptoms  None  Comments  Pt oriented to exercise equipment    Duration  Progress to 30 minutes of  aerobic without signs/symptoms of physical distress    Intensity  THRR unchanged      Progression   Progression  Continue to progress workloads to maintain intensity without signs/symptoms of physical distress.    Average METs  3.28      Resistance Training   Training  Prescription  Yes    Weight  3lbs    Reps  10-15    Time  10 Minutes      Recumbant Bike   Level  2    Minutes  10    METs  2.8      NuStep   Level  2    SPM  85    Minutes  10    METs  2.8      Track   Laps  16    Minutes  10    METs  3.76       Nutrition:  Target Goals: Understanding of nutrition guidelines, daily intake of sodium 1500mg , cholesterol 200mg , calories 30% from fat and 7% or less from saturated fats, daily to have 5 or more servings of fruits and vegetables.  Biometrics: Pre Biometrics - 08/01/18 1157      Pre Biometrics   Height  5' (1.524 m)    Weight  62.4 kg    Waist Circumference  31 inches    Hip Circumference  39 inches    Waist to Hip Ratio  0.79 %    BMI (Calculated)  26.87    Triceps Skinfold  26 mm    % Body Fat  37.6 %    Grip Strength  29 kg    Flexibility  16.5 in    Single Leg Stand  30 seconds        Nutrition Therapy Plan and Nutrition Goals: Nutrition Therapy & Goals - 08/02/18 0856      Nutrition Therapy   Diet  heart healthy carb modified      Personal Nutrition Goals   Nutrition Goal  Pt to identify food quantities necessary to achieve weight loss of 6-24 lb at graduation from cardiac rehab.    Personal Goal #2  Pt to build a healthy plate including vegetables, fruits, whole grains, and low-fat dairy products in a heart healthy meal plan.    Personal Goal #3  Pt to weigh and measure serving sizes    Personal Goal #4  Pt to practice mindful and intuitive eating exercises      Intervention Plan   Intervention  Prescribe, educate and counsel regarding individualized specific dietary modifications aiming towards targeted core components such as weight, hypertension, lipid management, diabetes, heart failure and other comorbidities.    Expected Outcomes  Short Term Goal: Understand basic principles of dietary content, such as calories, fat, sodium, cholesterol and nutrients.;Long Term Goal: Adherence to prescribed nutrition  plan.       Nutrition Assessments: Nutrition Assessments - 08/02/18 0857      MEDFICTS Scores   Pre Score  21       Nutrition Goals Re-Evaluation: Nutrition Goals Re-Evaluation    [place] Name 08/02/18 0856             Goals   Current Weight  137 lb 9.1 oz (62.4 kg)          Nutrition Goals Re-Evaluation: Nutrition Goals Re-Evaluation    Row Name 08/02/18 [phone]  Goals   Current Weight  137 lb 9.1 oz (62.4 kg)          Nutrition Goals Discharge (Final Nutrition Goals Re-Evaluation): Nutrition Goals Re-Evaluation - 08/02/18 0856      Goals   Current Weight  137 lb 9.1 oz (62.4 kg)       Psychosocial: Target Goals: Acknowledge presence or absence of significant depression and/or stress, maximize coping skills, provide positive support system. Participant is able to verbalize types and ability to use techniques and skills needed for reducing stress and depression.  Initial Review & Psychosocial Screening: Initial Psych Review & Screening - 08/01/18 1056      Initial Review   Current issues with  None Identified      Family Dynamics   Good Support System?  Yes   Pt lists her spouse, family, and friends as sources of support.      Barriers   Psychosocial barriers to participate in program  There are no identifiable barriers or psychosocial needs.      Screening Interventions   Interventions  Encouraged to exercise       Quality of Life Scores: Quality of Life - 08/01/18 1104      Quality of Life   Select  Quality of Life      Quality of Life Scores   Health/Function Pre  17.7 %    Socioeconomic Pre  27.5 %    Psych/Spiritual Pre  23.14 %    Family Pre  26.4 %    GLOBAL Pre  22.12 %      Scores of 19 and below usually indicate a poorer quality of life in these areas.  A difference of  2-3 points is a clinically meaningful difference.  A difference of 2-3 points in the total score of the Quality of Life Index has been associated with  significant improvement in overall quality of life, self-image, physical symptoms, and general health in studies assessing change in quality of life.  PHQ-9: Recent Review Flowsheet Data    Depression screen [facility] 2/9 08/05/2018 04/30/2018 05/02/2017 07/10/2016 12/04/2012   Decreased Interest 0 0 0 0 0   Down, Depressed, Hopeless 0 0 0 0 0   PHQ - 2 Score 0 0 0 0 0   Altered sleeping - 0 - 0 -   Tired, decreased energy - 0 - 0 -   Change in appetite - 0 - 0 -   Feeling bad or failure about yourself  - 0 - 0 -   Trouble concentrating - 0 - 0 -   Moving slowly or fidgety/restless - 0 - 0 -   Suicidal thoughts - 0 - 0 -   PHQ-9 Score - 0 - 0 -   Difficult doing work/chores - Not difficult at all - - -     Interpretation of Total Score  Total Score Depression Severity:  1-4 = Minimal depression, 5-9 = Mild depression, 10-14 = Moderate depression, 15-19 = Moderately severe depression, 20-27 = Severe depression   Psychosocial Evaluation and Intervention: Psychosocial Evaluation - 08/05/18 1411      Psychosocial Evaluation & Interventions   Interventions  Encouraged to exercise with the program and follow exercise prescription    Comments  No psychosocial interventions  Dawnetta enjoys yard work, gardening, decorating, and music.     Expected Outcomes  Jonna will continue to maintain a positive outlook with good coping skills.    Continue Psychosocial Services   No Follow up required  Psychosocial Re-Evaluation:   Psychosocial Discharge (Final Psychosocial Re-Evaluation):   Vocational Rehabilitation: Provide vocational rehab assistance to qualifying candidates.   Vocational Rehab Evaluation & Intervention: Vocational Rehab - 08/01/18 1215      Initial Vocational Rehab Evaluation & Intervention   Assessment shows need for Vocational Rehabilitation  No       Education: Education Goals: Education classes will be provided on a weekly basis, covering required topics. Participant  will state understanding/return demonstration of topics presented.  Learning Barriers/Preferences: Learning Barriers/Preferences - 08/01/18 1201      Learning Barriers/Preferences   Learning Barriers  Sight    Learning Preferences  Skilled Demonstration;Written Material;Individual Instruction       Education Topics: Count Your Pulse:  -Group instruction provided by verbal instruction, demonstration, patient participation and written materials to support subject.  Instructors address importance of being able to find your pulse and how to count your pulse when at home without a heart monitor.  Patients get hands on experience counting their pulse with staff help and individually.   Heart Attack, Angina, and Risk Factor Modification:  -Group instruction provided by verbal instruction, video, and written materials to support subject.  Instructors address signs and symptoms of angina and heart attacks.    Also discuss risk factors for heart disease and how to make changes to improve heart health risk factors.   Functional Fitness:  -Group instruction provided by verbal instruction, demonstration, patient participation, and written materials to support subject.  Instructors address safety measures for doing things around the house.  Discuss how to get up and down off the floor, how to pick things up properly, how to safely get out of a chair without assistance, and balance training.   Meditation and Mindfulness:  -Group instruction provided by verbal instruction, patient participation, and written materials to support subject.  Instructor addresses importance of mindfulness and meditation practice to help reduce stress and improve awareness.  Instructor also leads participants through a meditation exercise.    CARDIAC REHAB PHASE II EXERCISE from 08/07/2018 in [facility]  Date  08/07/18  Educator  RN  Instruction Review Code  2- Demonstrated Understanding       Stretching for Flexibility and Mobility:  -Group instruction provided by verbal instruction, patient participation, and written materials to support subject.  Instructors lead participants through series of stretches that are designed to increase flexibility thus improving mobility.  These stretches are additional exercise for major muscle groups that are typically performed during regular warm up and cool down.   Hands Only CPR:  -Group verbal, video, and participation provides a basic overview of AHA guidelines for community CPR. Role-play of emergencies allow participants the opportunity to practice calling for help and chest compression technique with discussion of AED use.   Hypertension: -Group verbal and written instruction that provides a basic overview of hypertension including the most recent diagnostic guidelines, risk factor reduction with self-care instructions and medication management.    Nutrition I class: Heart Healthy Eating:  -Group instruction provided by PowerPoint slides, verbal discussion, and written materials to support subject matter. The instructor gives an explanation and review of the Therapeutic Lifestyle Changes diet recommendations, which includes a discussion on lipid goals, dietary fat, sodium, fiber, plant stanol/sterol esters, sugar, and the components of a well-balanced, healthy diet.   Nutrition II class: Lifestyle Skills:  -Group instruction provided by PowerPoint slides, verbal discussion, and written materials to support subject matter. The instructor gives an explanation and  review of label reading, grocery shopping for heart health, heart healthy recipe modifications, and ways to make healthier choices when eating out.   Diabetes Question & Answer:  -Group instruction provided by PowerPoint slides, verbal discussion, and written materials to support subject matter. The instructor gives an explanation and review of diabetes co-morbidities, pre-  and post-prandial blood glucose goals, pre-exercise blood glucose goals, signs, symptoms, and treatment of hypoglycemia and hyperglycemia, and foot care basics.   Diabetes Blitz:  -Group instruction provided by PowerPoint slides, verbal discussion, and written materials to support subject matter. The instructor gives an explanation and review of the physiology behind type 1 and type 2 diabetes, diabetes medications and rational behind using different medications, pre- and post-prandial blood glucose recommendations and Hemoglobin A1c goals, diabetes diet, and exercise including blood glucose guidelines for exercising safely.    Portion Distortion:  -Group instruction provided by PowerPoint slides, verbal discussion, written materials, and food models to support subject matter. The instructor gives an explanation of serving size versus portion size, changes in portions sizes over the last 20 years, and what consists of a serving from each food group.   Stress Management:  -Group instruction provided by verbal instruction, video, and written materials to support subject matter.  Instructors review role of stress in heart disease and how to cope with stress positively.     Exercising on Your Own:  -Group instruction provided by verbal instruction, power point, and written materials to support subject.  Instructors discuss benefits of exercise, components of exercise, frequency and intensity of exercise, and end points for exercise.  Also discuss use of nitroglycerin and activating EMS.  Review options of places to exercise outside of rehab.  Review guidelines for sex with heart disease.   Cardiac Drugs I:  -Group instruction provided by verbal instruction and written materials to support subject.  Instructor reviews cardiac drug classes: antiplatelets, anticoagulants, beta blockers, and statins.  Instructor discusses reasons, side effects, and lifestyle considerations for each drug  class.   Cardiac Drugs II:  -Group instruction provided by verbal instruction and written materials to support subject.  Instructor reviews cardiac drug classes: angiotensin converting enzyme inhibitors (ACE-I), angiotensin II receptor blockers (ARBs), nitrates, and calcium channel blockers.  Instructor discusses reasons, side effects, and lifestyle considerations for each drug class.   Anatomy and Physiology of the Circulatory System:  Group verbal and written instruction and models provide basic cardiac anatomy and physiology, with the coronary electrical and arterial systems. Review of: AMI, Angina, Valve disease, Heart Failure, Peripheral Artery Disease, Cardiac Arrhythmia, Pacemakers, and the ICD.   Other Education:  -Group or individual verbal, written, or video instructions that support the educational goals of the cardiac rehab program.   Holiday Eating Survival Tips:  -Group instruction provided by PowerPoint slides, verbal discussion, and written materials to support subject matter. The instructor gives patients tips, tricks, and techniques to help them not only survive but enjoy the holidays despite the onslaught of food that accompanies the holidays.   Knowledge Questionnaire Score: Knowledge Questionnaire Score - 08/01/18 1108      Knowledge Questionnaire Score   Pre Score  20/24       Core Components/Risk Factors/Patient Goals at Admission: Personal Goals and Risk Factors at Admission - 08/01/18 1202      Core Components/Risk Factors/Patient Goals on Admission    Weight Management  Yes;Weight Maintenance    Intervention  Weight Management: Develop a combined nutrition and exercise program designed to reach desired caloric  intake, while maintaining appropriate intake of nutrient and fiber, sodium and fats, and appropriate energy expenditure required for the weight goal.;Weight Management: Provide education and appropriate resources to help participant work on and attain  dietary goals.;Weight Management/Obesity: Establish reasonable short term and long term weight goals.    Admit Weight  137 lb 9.1 oz (62.4 kg)    Expected Outcomes  Short Term: Continue to assess and modify interventions until short term weight is achieved;Long Term: Adherence to nutrition and physical activity/exercise program aimed toward attainment of established weight goal;Weight Maintenance: Understanding of the daily nutrition guidelines, which includes 25-35% calories from fat, 7% or less cal from saturated fats, less than 200mg  cholesterol, less than 1.5gm of sodium, & 5 or more servings of fruits and vegetables daily;Understanding recommendations for meals to include 15-35% energy as protein, 25-35% energy from fat, 35-60% energy from carbohydrates, less than 200mg  of dietary cholesterol, 20-35 gm of total fiber daily;Understanding of distribution of calorie intake throughout the day with the consumption of 4-5 meals/snacks    Hypertension  Yes    Intervention  Provide education on lifestyle modifcations including regular physical activity/exercise, weight management, moderate sodium restriction and increased consumption of fresh fruit, vegetables, and low fat dairy, alcohol moderation, and smoking cessation.;Monitor prescription use compliance.    Expected Outcomes  Short Term: Continued assessment and intervention until BP is < 140/18mm HG in hypertensive participants. < 130/4mm HG in hypertensive participants with diabetes, heart failure or chronic kidney disease.;Long Term: Maintenance of blood pressure at goal levels.    Lipids  Yes    Intervention  Provide education and support for participant on nutrition & aerobic/resistive exercise along with prescribed medications to achieve LDL 70mg , HDL >40mg .    Expected Outcomes  Short Term: Participant states understanding of desired cholesterol values and is compliant with medications prescribed. Participant is following exercise prescription and  nutrition guidelines.;Long Term: Cholesterol controlled with medications as prescribed, with individualized exercise RX and with personalized nutrition plan. Value goals: LDL < 70mg , HDL > 40 mg.    Stress  Yes    Intervention  Offer individual and/or small group education and counseling on adjustment to heart disease, stress management and health-related lifestyle change. Teach and support self-help strategies.;Refer participants experiencing significant psychosocial distress to appropriate mental health specialists for further evaluation and treatment. When possible, include family members and significant others in education/counseling sessions.    Expected Outcomes  Short Term: Participant demonstrates changes in health-related behavior, relaxation and other stress management skills, ability to obtain effective social support, and compliance with psychotropic medications if prescribed.;Long Term: Emotional wellbeing is indicated by absence of clinically significant psychosocial distress or social isolation.       Core Components/Risk Factors/Patient Goals Review:  Goals and Risk Factor Review    Row Name 08/05/18 1413             Core Components/Risk Factors/Patient Goals Review   Personal Goals Review  Lipids;Stress;Hypertension;Weight Management/Obesity       Review  Pt with multiple CAD RFs willing to participate in CR exercise.  Kinza would like to increase her motivation for exercise.        Expected Outcomes  Pt will continue to participate in CR exercise, nutrition, and lifestyle modification opportunities.           Core Components/Risk Factors/Patient Goals at Discharge (Final Review):  Goals and Risk Factor Review - 08/05/18 1413      Core Components/Risk Factors/Patient Goals Review   Personal  Goals Review  Lipids;Stress;Hypertension;Weight Management/Obesity    Review  Pt with multiple CAD RFs willing to participate in CR exercise.  Lively would like to increase her  motivation for exercise.     Expected Outcomes  Pt will continue to participate in CR exercise, nutrition, and lifestyle modification opportunities.        ITP Comments: ITP Comments    Row Name 08/01/18 1156 08/05/18 1411         ITP Comments  Dr. Fransico Him, Medical Director  30 Day ITP Review.  Pt started exericse today and tolerated it well.          Comments: See ITP Comments.

## 2018-08-09 ENCOUNTER — Encounter (HOSPITAL_COMMUNITY)
Admission: RE | Admit: 2018-08-09 | Discharge: 2018-08-09 | Disposition: A | Discharge status: Home / Self Care | Payer: Medicare Other | Source: Ambulatory Visit | Attending: Cardiovascular Disease | Admitting: Cardiovascular Disease

## 2018-08-09 ENCOUNTER — Ambulatory Visit (HOSPITAL_COMMUNITY): Payer: Medicare Other

## 2018-08-09 DIAGNOSIS — I214 Non-ST elevation (NSTEMI) myocardial infarction: Secondary | ICD-10-CM | POA: Diagnosis not present

## 2018-08-09 DIAGNOSIS — Z955 Presence of coronary angioplasty implant and graft: Secondary | ICD-10-CM | POA: Diagnosis not present

## 2018-08-12 ENCOUNTER — Other Ambulatory Visit: Payer: Medicare Other | Admitting: *Deleted

## 2018-08-12 ENCOUNTER — Ambulatory Visit (INDEPENDENT_AMBULATORY_CARE_PROVIDER_SITE_OTHER): Payer: Medicare Other | Admitting: Pharmacist

## 2018-08-12 ENCOUNTER — Ambulatory Visit (HOSPITAL_COMMUNITY): Payer: Medicare Other

## 2018-08-12 ENCOUNTER — Encounter (HOSPITAL_COMMUNITY)
Admission: RE | Admit: 2018-08-12 | Discharge: 2018-08-12 | Disposition: A | Discharge status: Home / Self Care | Payer: Medicare Other | Source: Ambulatory Visit | Attending: Cardiovascular Disease | Admitting: Cardiovascular Disease

## 2018-08-12 DIAGNOSIS — Z955 Presence of coronary angioplasty implant and graft: Secondary | ICD-10-CM | POA: Diagnosis not present

## 2018-08-12 DIAGNOSIS — I480 Paroxysmal atrial fibrillation: Secondary | ICD-10-CM

## 2018-08-12 DIAGNOSIS — E785 Hyperlipidemia, unspecified: Secondary | ICD-10-CM | POA: Diagnosis not present

## 2018-08-12 DIAGNOSIS — I214 Non-ST elevation (NSTEMI) myocardial infarction: Secondary | ICD-10-CM | POA: Diagnosis not present

## 2018-08-12 DIAGNOSIS — I10 Essential (primary) hypertension: Secondary | ICD-10-CM | POA: Diagnosis not present

## 2018-08-12 DIAGNOSIS — I25119 Atherosclerotic heart disease of native coronary artery with unspecified angina pectoris: Secondary | ICD-10-CM

## 2018-08-12 DIAGNOSIS — I2510 Atherosclerotic heart disease of native coronary artery without angina pectoris: Secondary | ICD-10-CM

## 2018-08-12 MED ORDER — ICOSAPENT ETHYL 1 G PO CAPS: 2.0000 g | ORAL_CAPSULE | Freq: Two times a day (BID) | ORAL | 11 refills | Status: DC

## 2018-08-12 NOTE — Patient Instructions (Addendum)
It was nice meeting you today!  We will start the process to get your new cholesterol medicine approved by insurance. Depending on what they cover, we will either start you on Repatha or Praulent. We will call you once we have an update on coverage.  We will send the prescription for Vascepa to your pharmacy. Please let us know if there are any issues with the copay.   Continue exercising and watching your diet. Try to avoid saturated fats found in red meat and foods high in sugar and carbohydrates.   Please call us with any questions at [phone].

## 2018-08-12 NOTE — Progress Notes (Signed)
Patient ID: Shannon Cummings                 DOB: 1945/12/26                    MRN: [id number]     HPI: Shannon Cummings is a 73 y.o. female patient of Dr. Acie Fredrickson that presents today for lipid evaluation.  PMH includes CAD (PCI in 2000, MI with PCI 08/2012, MI 06/2018), HTN, and atrial fibrillation. Patient has been enrolled in the CLEAR trial for the past several years. She was previously in a PCSK-9 inhibitor study (SPIRE II) but trial was stopped early. Patient has tried statins and Zetia in the past but discontinued them due to intolerance. Patient is not currently on any lipid lowering therapy and is interested in starting a PCSK9i.   Patient is doing well today. Discussed with patient the benefits of adding on a PCSK9i or bempedoic acid to lower LDL as well as Vascepa to lower triglycerides. Patient was concerned about cost. Patient reports tolerating the PCSK9i used in the past in the [facility] II trial except for injection site reactions. Explained to patient that this reaction is seen less in the PCSK9i's on the market. Patient asked about CBD/THC products and was counseled on their safety in relation to her medication list.   Risk Factors: CAD, family history, elevated lipids, HTN, age LDL Goal: <70 mg/dL  Current Medications: N/A Intolerances:  Crestor 5 mg once weekly and also daily (muscle aches in legs) Lipitor 10 mg twice weekly and daily (muscle aches in legs) Simvastatin daily (muscle aches)  She thinks she tried Livalo as well, but not positive Zetia (leg aches)  Diet: pasta once every few weeks, some potatos/rice/bread, drinks half and half tea and flavored celtzer, does not a lot of water, no soda  Exercise: Exercises regularly at the [facility] - yoga or walking ~2 miles. Recently started at the cardiac rehab in March 2020.   Family History: Mother - Alzheimer's disease; Maternal Aunt - Breast cancer; Father - died of MI (48 yo); Son - elevated LDL - but no heart disease   Social  History: The patient reports that she has never smoked. She has never used smokeless tobacco. She reports current alcohol use (white or red once once a day).   Labs: 07/31/2018: TC 276, TG 185, HDL 49, LDL 190, LFT wnl (no therapy except trial drug)    Past Medical History:  Diagnosis Date  . Arthritis   . CAD (coronary artery disease)    a. prior stenting history. b. NSTEMI s/p DES to prox LAD with EF 45% by cath 08/14/12. c. Mild trop elevation shortly after cath ?mild plaque embolization - patent stent on relook. // d. Myoview 8/18: EF 60, normal perfusion; Low Risk. e. NSTEMI 06/2018- patent prior LAD stent, 80% OM1 s/p DES, normal LVEDP, EF 45-50%, moderate residual disease treated medically.  . Fatty infiltration of liver   . GERD (gastroesophageal reflux disease)   . Hyperlipidemia   . Hypertension   . Hypokalemia   . Hypotension   . Myocardial infarction ([place]) 2014  . OSA on CPAP   . Osteoporosis   . PAF (paroxysmal atrial fibrillation) ([place])    On rythmol previously  . Pulmonary nodule    a. 79mm subpleural nodular density CT 08/2012, instructed to f/u pulm MD.  . Sinus bradycardia   . Tubular adenoma of colon 2007    Current Outpatient Medications on File Prior to Visit  Medication Sig Dispense Refill  . acetaminophen (TYLENOL) 325 MG tablet Take 325-650 mg by mouth every 8 (eight) hours as needed (for pain).     . AMBULATORY NON FORMULARY MEDICATION Take 180 mg by mouth daily. Medication Name: bempedoic acid 180 mg vs a placebo.  CLEAR Research Study, drug provided    . amiodarone (PACERONE) 200 MG tablet Take 1 tablet (200 mg total) by mouth daily. 30 tablet 6  . Cholecalciferol (VITAMIN D3) 25 MCG (1000 UT) CAPS Take 1,000 Units by mouth daily.    . clopidogrel (PLAVIX) 75 MG tablet Take 1 tablet (75 mg total) by mouth daily. 30 tablet 11  . Cyanocobalamin (B-12) 1000 MCG LOZG Take 1,000 mcg by mouth 2 (two) times daily.     Marland Kitchen ELIQUIS 5 MG TABS tablet TAKE 1 TABLET(5 MG) BY  MOUTH TWICE DAILY 180 tablet 2  . fluticasone (FLONASE) 50 MCG/ACT nasal spray Place 2 sprays into both nostrils daily as needed for allergies or rhinitis.    Marland Kitchen losartan (COZAAR) 25 MG tablet Take 2 tablets (50 mg total) by mouth daily. 60 tablet 11  . nitroGLYCERIN (NITROSTAT) 0.4 MG SL tablet Place 1 tablet (0.4 mg total) under the tongue every 5 (five) minutes as needed for chest pain (up to 3 doses). 25 tablet 6  . pantoprazole (PROTONIX) 40 MG tablet Take 40 mg by mouth daily.    . potassium chloride (K-DUR) 10 MEQ tablet Take 1 tablet (10 mEq total) by mouth daily. 90 tablet 3  . propranolol (INDERAL) 10 MG tablet Take 1 tablet (10 mg total) by mouth 4 (four) times daily as needed. 60 tablet 11  . spironolactone (ALDACTONE) 25 MG tablet Take 0.5 tablets (12.5 mg total) by mouth daily. 45 tablet 3  . temazepam (RESTORIL) 15 MG capsule Take 15 mg by mouth at bedtime as needed for sleep.     No current facility-administered medications on file prior to visit.     Allergies  Allergen Reactions  . Isosorbide     Headache  . Other Anxiety and Other (See Comments)    Intolerance to strong pain medications=  Make her feel anxious and feel like coming out of her skin  . Effexor [Venlafaxine] Other (See Comments)    CAUSED TOO MUCH SEDATION; CANNOT TOLERATE  . Statins Other (See Comments)    Restless legs, bad feeling.  This has occurred with Crestor 5 mg once weekly, Lipitor 10 mg twice weekly, simvastatin daily  . Zetia [Ezetimibe]     Leg aches    Assessment/Plan: Hyperlipidemia: Patient's LDL is above goal of <70. Patient wants to start a PCSK9i and continue working on her diet to help get her to goal. Will start the insurance process and send prescription to pharmacy once it is approved. Patient is agreeable to contacting our clinic once she starts her PCSK9i in order to schedule follow-up labs. Patient also wants to start Vascepa if  copay is low enough. Prescription sent to pharmacy  with note to verify cost with patient.   Seen by Willia Craze, PharmD Candidate  Thank you,  Lelan Pons. Patterson Hammersmith, [facility]  08/12/2018 11:27 AM

## 2018-08-13 ENCOUNTER — Telehealth: Payer: Self-pay | Admitting: Pharmacist

## 2018-08-13 LAB — BASIC METABOLIC PANEL
BUN/Creatinine Ratio: 12 (ref 12–28)
BUN: 12 mg/dL (ref 8–27)
CO2: 22 mmol/L (ref 20–29)
Calcium: 9.3 mg/dL (ref 8.7–10.3)
Chloride: 96 mmol/L (ref 96–106)
Creatinine, Ser: 1.01 mg/dL — ABNORMAL HIGH (ref 0.57–1.00)
GFR calc Af Amer: 64 mL/min/{1.73_m2} (ref >59)
GFR calc non Af Amer: 56 mL/min/{1.73_m2} — ABNORMAL LOW (ref >59)
GLUCOSE: 104 mg/dL — AB (ref 65–99)
Potassium: 4.2 mmol/L (ref 3.5–5.2)
Sodium: 137 mmol/L (ref 134–144)

## 2018-08-13 MED ORDER — ALIROCUMAB 75 MG/ML ~~LOC~~ SOAJ: 75.0000 mg | SUBCUTANEOUS | 11 refills | Status: DC

## 2018-08-13 NOTE — Telephone Encounter (Signed)
Pt approved for Praluent through insurance. Pt aware and will call if cost-prohibitive or once starts medication. She will hold off on Vascepa for now as copay $150 per month.

## 2018-08-14 ENCOUNTER — Ambulatory Visit (HOSPITAL_COMMUNITY): Payer: Medicare Other

## 2018-08-14 ENCOUNTER — Encounter (HOSPITAL_COMMUNITY): Payer: Medicare Other

## 2018-08-15 ENCOUNTER — Encounter: Payer: Self-pay | Admitting: Internal Medicine

## 2018-08-16 ENCOUNTER — Ambulatory Visit (HOSPITAL_COMMUNITY): Payer: Medicare Other

## 2018-08-16 ENCOUNTER — Encounter (HOSPITAL_COMMUNITY): Payer: Medicare Other

## 2018-08-19 ENCOUNTER — Ambulatory Visit (HOSPITAL_COMMUNITY): Payer: Medicare Other

## 2018-08-19 ENCOUNTER — Encounter (HOSPITAL_COMMUNITY): Payer: Medicare Other

## 2018-08-21 ENCOUNTER — Ambulatory Visit (HOSPITAL_COMMUNITY): Payer: Medicare Other

## 2018-08-21 ENCOUNTER — Encounter (HOSPITAL_COMMUNITY): Payer: Medicare Other

## 2018-08-22 ENCOUNTER — Institutional Professional Consult (permissible substitution): Payer: Medicare Other | Admitting: Internal Medicine

## 2018-08-23 ENCOUNTER — Encounter (HOSPITAL_COMMUNITY): Payer: Medicare Other

## 2018-08-23 ENCOUNTER — Ambulatory Visit (HOSPITAL_COMMUNITY): Payer: Medicare Other

## 2018-08-23 NOTE — Progress Notes (Signed)
Discharge Progress Report  Patient Details  Name: Shannon [facility]ings MRN: [id number] Date of Birth: 05-02-1946 Referring Provider:     CARDIAC REHAB PHASE II ORIENTATION from 08/01/2018 in [place]  Referring Provider  Nahser, Wonda Cheng MD       Number of Visits: 5  Reason for Discharge:  Early Exit:  Personal  Smoking History:  Social History   Tobacco Use  Smoking Status Never Smoker  Smokeless Tobacco Never Used    Diagnosis:  NSTEMI (non-ST elevated myocardial infarction) ([place])  Status post coronary artery stent placement  ADL UCSD:   Initial Exercise Prescription: Initial Exercise Prescription - 08/01/18 1100      Date of Initial Exercise RX and Referring Provider   Date  08/01/18    Referring Provider  Nahser, Wonda Cheng MD    Expected Discharge Date  11/06/18      Recumbant Bike   Level  2    Watts  25    Minutes  10    METs  3.21      NuStep   Level  2    SPM  85    Minutes  10    METs  3      Track   Laps  13    Minutes  10    METs  3.3      Prescription Details   Frequency (times per week)  3x    Duration  Progress to 30 minutes of continuous aerobic without signs/symptoms of physical distress      Intensity   THRR 40-80% of Max Heartrate  59-118    Ratings of Perceived Exertion  11-13    Perceived Dyspnea  0-4      Progression   Progression  Continue progressive overload as per policy without signs/symptoms or physical distress.      Resistance Training   Training Prescription  Yes    Weight  3lbs    Reps  10-15       Discharge Exercise Prescription (Final Exercise Prescription Changes): Exercise Prescription Changes - 08/12/18 1300      Response to Exercise   Blood Pressure (Admit)  114/78    Blood Pressure (Exercise)  132/76    Blood Pressure (Exit)  102/64    Heart Rate (Admit)  60 bpm    Heart Rate (Exercise)  81 bpm    Heart Rate (Exit)  68 bpm    Rating of Perceived Exertion (Exercise)   11    Perceived Dyspnea (Exercise)  0    Symptoms  None    Comments  Pt dropped from rehab     Duration  Progress to 30 minutes of  aerobic without signs/symptoms of physical distress    Intensity  THRR unchanged      Progression   Progression  Continue to progress workloads to maintain intensity without signs/symptoms of physical distress.    Average METs  3.22      Resistance Training   Training Prescription  Yes    Weight  3lbs    Reps  10-15    Time  10 Minutes      Recumbant Bike   Level  2    Minutes  10    METs  3      NuStep   Level  2    SPM  85    Minutes  10    METs  2.9      Track  Laps  16    Minutes  10    METs  3.76       Functional Capacity: 6 Minute Walk    Row Name 08/01/18 1156         6 Minute Walk   Phase  Initial     Distance  1685 feet     Distance % Change  0 %     Distance Feet Change  0 ft     Walk Time  6 minutes     # of Rest Breaks  0     MPH  3.2     METS  3.21     RPE  11     Perceived Dyspnea   0     VO2 Peak  11.22     Symptoms  No     Resting HR  66 bpm     Resting BP  128/72     Resting Oxygen Saturation   99 %     Exercise Oxygen Saturation  during 6 min walk  99 %     Max Ex. HR  86 bpm     Max Ex. BP  130/80     2 Minute Post BP  122/68        Psychological, QOL, Others - Outcomes: PHQ 2/9: Depression screen [facility] 2/9 08/05/2018 04/30/2018 05/02/2017 07/10/2016 12/04/2012  Decreased Interest 0 0 0 0 0  Down, Depressed, Hopeless 0 0 0 0 0  PHQ - 2 Score 0 0 0 0 0  Altered sleeping - 0 - 0 -  Tired, decreased energy - 0 - 0 -  Change in appetite - 0 - 0 -  Feeling bad or failure about yourself  - 0 - 0 -  Trouble concentrating - 0 - 0 -  Moving slowly or fidgety/restless - 0 - 0 -  Suicidal thoughts - 0 - 0 -  PHQ-9 Score - 0 - 0 -  Difficult doing work/chores - Not difficult at all - - -  Some recent data might be hidden    Quality of Life: Quality of Life - 08/01/18 1104      Quality of Life   Select   Quality of Life      Quality of Life Scores   Health/Function Pre  17.7 %    Socioeconomic Pre  27.5 %    Psych/Spiritual Pre  23.14 %    Family Pre  26.4 %    GLOBAL Pre  22.12 %       Personal Goals: Goals established at orientation with interventions provided to work toward goal. Personal Goals and Risk Factors at Admission - 08/01/18 1202      Core Components/Risk Factors/Patient Goals on Admission    Weight Management  Yes;Weight Maintenance    Intervention  Weight Management: Develop a combined nutrition and exercise program designed to reach desired caloric intake, while maintaining appropriate intake of nutrient and fiber, sodium and fats, and appropriate energy expenditure required for the weight goal.;Weight Management: Provide education and appropriate resources to help participant work on and attain dietary goals.;Weight Management/Obesity: Establish reasonable short term and long term weight goals.    Admit Weight  137 lb 9.1 oz (62.4 kg)    Expected Outcomes  Short Term: Continue to assess and modify interventions until short term weight is achieved;Long Term: Adherence to nutrition and physical activity/exercise program aimed toward attainment of established weight goal;Weight Maintenance: Understanding of the daily nutrition guidelines, which includes  25-35% calories from fat, 7% or less cal from saturated fats, less than 200mg  cholesterol, less than 1.5gm of sodium, & 5 or more servings of fruits and vegetables daily;Understanding recommendations for meals to include 15-35% energy as protein, 25-35% energy from fat, 35-60% energy from carbohydrates, less than 200mg  of dietary cholesterol, 20-35 gm of total fiber daily;Understanding of distribution of calorie intake throughout the day with the consumption of 4-5 meals/snacks    Hypertension  Yes    Intervention  Provide education on lifestyle modifcations including regular physical activity/exercise, weight management, moderate  sodium restriction and increased consumption of fresh fruit, vegetables, and low fat dairy, alcohol moderation, and smoking cessation.;Monitor prescription use compliance.    Expected Outcomes  Short Term: Continued assessment and intervention until BP is < 140/76mm HG in hypertensive participants. < 130/66mm HG in hypertensive participants with diabetes, heart failure or chronic kidney disease.;Long Term: Maintenance of blood pressure at goal levels.    Lipids  Yes    Intervention  Provide education and support for participant on nutrition & aerobic/resistive exercise along with prescribed medications to achieve LDL 70mg , HDL >40mg .    Expected Outcomes  Short Term: Participant states understanding of desired cholesterol values and is compliant with medications prescribed. Participant is following exercise prescription and nutrition guidelines.;Long Term: Cholesterol controlled with medications as prescribed, with individualized exercise RX and with personalized nutrition plan. Value goals: LDL < 70mg , HDL > 40 mg.    Stress  Yes    Intervention  Offer individual and/or small group education and counseling on adjustment to heart disease, stress management and health-related lifestyle change. Teach and support self-help strategies.;Refer participants experiencing significant psychosocial distress to appropriate mental health specialists for further evaluation and treatment. When possible, include family members and significant others in education/counseling sessions.    Expected Outcomes  Short Term: Participant demonstrates changes in health-related behavior, relaxation and other stress management skills, ability to obtain effective social support, and compliance with psychotropic medications if prescribed.;Long Term: Emotional wellbeing is indicated by absence of clinically significant psychosocial distress or social isolation.        Personal Goals Discharge: Goals and Risk Factor Review    Row Name  08/05/18 1413 08/19/18 1350           Core Components/Risk Factors/Patient Goals Review   Personal Goals Review  Lipids;Stress;Hypertension;Weight Management/Obesity  Lipids;Stress;Hypertension;Weight Management/Obesity      Review  Pt with multiple CAD RFs willing to participate in CR exercise.  Fredia would like to increase her motivation for exercise.   Pt did not require any psychosocial interventions.  Pt dropped from the program after 5 sessions per pt request.      Expected Outcomes  Pt will continue to participate in CR exercise, nutrition, and lifestyle modification opportunities.   Pt will continue to participate in exercise, nutrition, and lifestyle modification opportunities.  Alexy plans to return to her local [employer].         Exercise Goals and Review: Exercise Goals    Row Name 08/01/18 1158             Exercise Goals   Increase Physical Activity  Yes       Intervention  Provide advice, education, support and counseling about physical activity/exercise needs.;Develop an individualized exercise prescription for aerobic and resistive training based on initial evaluation findings, risk stratification, comorbidities and participant's personal goals.       Expected Outcomes  Short Term: Attend rehab on a regular basis to increase  amount of physical activity.;Long Term: Add in home exercise to make exercise part of routine and to increase amount of physical activity.;Long Term: Exercising regularly at least 3-5 days a week.       Increase Strength and Stamina  Yes       Intervention  Provide advice, education, support and counseling about physical activity/exercise needs.;Develop an individualized exercise prescription for aerobic and resistive training based on initial evaluation findings, risk stratification, comorbidities and participant's personal goals.       Expected Outcomes  Short Term: Increase workloads from initial exercise prescription for resistance, speed, and  METs.;Short Term: Perform resistance training exercises routinely during rehab and add in resistance training at home;Long Term: Improve cardiorespiratory fitness, muscular endurance and strength as measured by increased METs and functional capacity (6MWT)       Able to understand and use rate of perceived exertion (RPE) scale  Yes       Intervention  Provide education and explanation on how to use RPE scale       Expected Outcomes  Short Term: Able to use RPE daily in rehab to express subjective intensity level;Long Term:  Able to use RPE to guide intensity level when exercising independently       Knowledge and understanding of Target Heart Rate Range (THRR)  Yes       Intervention  Provide education and explanation of THRR including how the numbers were predicted and where they are located for reference       Expected Outcomes  Short Term: Able to state/look up THRR;Short Term: Able to use daily as guideline for intensity in rehab;Long Term: Able to use THRR to govern intensity when exercising independently       Able to check pulse independently  Yes       Intervention  Provide education and demonstration on how to check pulse in carotid and radial arteries.;Review the importance of being able to check your own pulse for safety during independent exercise       Expected Outcomes  Short Term: Able to explain why pulse checking is important during independent exercise;Long Term: Able to check pulse independently and accurately       Understanding of Exercise Prescription  Yes       Intervention  Provide education, explanation, and written materials on patient's individual exercise prescription       Expected Outcomes  Short Term: Able to explain program exercise prescription;Long Term: Able to explain home exercise prescription to exercise independently          Exercise Goals Re-Evaluation:   Nutrition & Weight - Outcomes: Pre Biometrics - 08/01/18 1157      Pre Biometrics   Height  5'  (1.524 m)    Weight  62.4 kg    Waist Circumference  31 inches    Hip Circumference  39 inches    Waist to Hip Ratio  0.79 %    BMI (Calculated)  26.87    Triceps Skinfold  26 mm    % Body Fat  37.6 %    Grip Strength  29 kg    Flexibility  16.5 in    Single Leg Stand  30 seconds        Nutrition: Nutrition Therapy & Goals - 08/22/18 1009      Nutrition Therapy   Diet  heart healthy carb modified      Personal Nutrition Goals   Nutrition Goal  Pt to identify food quantities necessary to  achieve weight loss of 6-24 lb at graduation from cardiac rehab.    Personal Goal #2  Pt to build a healthy plate including vegetables, fruits, whole grains, and low-fat dairy products in a heart healthy meal plan.   unable to assess goal at this time as pt did not return post survey   Personal Goal #3  Pt to weigh and measure serving sizes   unable to assess goal at this time as pt did not return post survey   Personal Goal #4  Pt to practice mindful and intuitive eating exercises   unable to assess goal at this time as pt did not return post [place], educate and counsel regarding individualized specific dietary modifications aiming towards targeted core components such as weight, hypertension, lipid management, diabetes, heart failure and other comorbidities.    Expected Outcomes  Short Term Goal: Understand basic principles of dietary content, such as calories, fat, sodium, cholesterol and nutrients.;Long Term Goal: Adherence to prescribed nutrition plan.       Nutrition Discharge: Nutrition Assessments - 08/22/18 1007      MEDFICTS Scores   Pre Score  21    Post Score  --   pt did not return post survey      Education Questionnaire Score: Knowledge Questionnaire Score - 08/01/18 1108      Knowledge Questionnaire Score   Pre Score  20/24       Goals reviewed with patient; copy given to patient.

## 2018-08-26 ENCOUNTER — Ambulatory Visit (HOSPITAL_COMMUNITY): Payer: Medicare Other

## 2018-08-26 ENCOUNTER — Encounter: Payer: Self-pay | Admitting: Internal Medicine

## 2018-08-26 ENCOUNTER — Encounter (HOSPITAL_COMMUNITY): Payer: Medicare Other

## 2018-08-26 ENCOUNTER — Institutional Professional Consult (permissible substitution): Payer: Medicare Other | Admitting: Internal Medicine

## 2018-08-26 ENCOUNTER — Other Ambulatory Visit: Payer: Self-pay

## 2018-08-26 ENCOUNTER — Telehealth (INDEPENDENT_AMBULATORY_CARE_PROVIDER_SITE_OTHER): Payer: Medicare Other | Admitting: Internal Medicine

## 2018-08-26 DIAGNOSIS — G4733 Obstructive sleep apnea (adult) (pediatric): Secondary | ICD-10-CM | POA: Diagnosis not present

## 2018-08-26 DIAGNOSIS — I480 Paroxysmal atrial fibrillation: Secondary | ICD-10-CM | POA: Diagnosis not present

## 2018-08-26 DIAGNOSIS — I255 Ischemic cardiomyopathy: Secondary | ICD-10-CM

## 2018-08-26 MED ORDER — AMIODARONE HCL 200 MG PO TABS: 100.0000 mg | ORAL_TABLET | Freq: Every day | ORAL | 3 refills | Status: DC

## 2018-08-26 NOTE — Progress Notes (Signed)
Electrophysiology TeleHealth Note   Due to national recommendations of social distancing due to [place] 19, Audio/video telehealth visit is felt to be most appropriate for this patient at this time.  See MyChart message from today for patient consent regarding telehealth for Shannon Cummings.   Date:  08/26/2018   ID:  Shannon Cummings, DOB [birth year], MRN [id number]  Location: home  Provider location: [address] [employer] [employer] [place] Evaluation Performed: New patient consult  PCP:  [name], MD  Cardiologist:  [name], MD  Electrophysiologist:  Dr Rayann Heman  Chief Complaint:  afib  History of Present Illness:    Kasidy Cummings is a 73 y.o. female who presents via audio/video conferencing for a telehealth visit today.   The patient is referred for new consultation regarding afib by Dr Acie Fredrickson and Roderic Palau NP.   She has a h/o paroxysmal atrial fibrillation. She reports having afib for "Years". (she thinks since at least 2010). She was initially treated with propafenone.  She developed CAD and this medicine was discontinued 06/2018.  She has been placed on amiodarone.  Her afib has improved.  She has occasional SOB.  Though this improved with reduced amiodarone, it continues.  She is concerned about long term risks of amiodarone.  She would like to consider ablation. She is very symptomatic when in afib.   She has tachypalpitations.  She is washed out and has fatigue. Today, she denies symptoms of palpitations, chest pain, orthopnea, PND, lower extremity edema, claudication, dizziness, presyncope, syncope, bleeding, or neurologic sequela. The patient is tolerating medications without difficulties and is otherwise without complaint today.   she denies symptoms of cough, fevers, chills, or new SOB worrisome for COVID 19.   Past Medical History:  Diagnosis Date   Arthritis    CAD (coronary artery disease)    a. prior stenting history. b. NSTEMI s/p DES to prox LAD with  EF 45% by cath 08/14/12. c. Mild trop elevation shortly after cath ?mild plaque embolization - patent stent on relook. // d. Myoview 8/18: EF 60, normal perfusion; Low Risk. e. NSTEMI 06/2018- patent prior LAD stent, 80% OM1 s/p DES, normal LVEDP, EF 45-50%, moderate residual disease treated medically.   Fatty infiltration of liver    GERD (gastroesophageal reflux disease)    Hyperlipidemia    Hypertension    Hypokalemia    Hypotension    Myocardial infarction ([place]) 2014   OSA on CPAP    Osteoporosis    PAF (paroxysmal atrial fibrillation) ([place])    On rythmol previously   Pulmonary nodule    a. 22mm subpleural nodular density CT 08/2012, instructed to f/u pulm MD.   Sinus bradycardia    Tubular adenoma of colon 2007    Past Surgical History:  Procedure Laterality Date   CORONARY ANGIOPLASTY WITH STENT PLACEMENT  08/14/2012   LAD  Dr [name]   CORONARY STENT INTERVENTION N/A 06/14/2018   Procedure: CORONARY STENT INTERVENTION;  Surgeon: Jettie Booze, MD;  Location: [facility];  Service: Cardiovascular;  Laterality: N/A;   CORONARY STENT PLACEMENT  2000   hair restoration  02/2017   LEFT HEART CATH AND CORONARY ANGIOGRAPHY N/A 06/14/2018   Procedure: LEFT HEART CATH AND CORONARY ANGIOGRAPHY;  Surgeon: Jettie Booze, MD;  Location: [place] [facility];  Service: Cardiovascular;  Laterality: N/A;   LEFT HEART CATHETERIZATION WITH CORONARY ANGIOGRAM N/A 08/14/2012   Procedure: LEFT HEART CATHETERIZATION WITH CORONARY ANGIOGRAM;  Surgeon: [name],  MD;  Location: [facility];  Service: Cardiovascular;  Laterality: N/A;   LEFT HEART CATHETERIZATION WITH CORONARY ANGIOGRAM N/A 08/19/2012   Procedure: LEFT HEART CATHETERIZATION WITH CORONARY ANGIOGRAM;  Surgeon: [name], MD;  Location: [facility];  Service: Cardiovascular;  Laterality: N/A;   ROTATOR CUFF REPAIR     TONSILLECTOMY     TOTAL ABDOMINAL HYSTERECTOMY      Current  Outpatient Medications  Medication Sig Dispense Refill   acetaminophen (TYLENOL) 325 MG tablet Take 325-650 mg by mouth every 8 (eight) hours as needed (for pain).      Alirocumab (PRALUENT) 75 MG/ML SOAJ Inject 75 mg into the skin every 14 (fourteen) days. 2 pen 11   AMBULATORY NON FORMULARY MEDICATION Take 180 mg by mouth daily. Medication Name: bempedoic acid 180 mg vs a placebo.  CLEAR Research Study, drug provided     amiodarone (PACERONE) 200 MG tablet Take 1 tablet (200 mg total) by mouth daily. 30 tablet 6   Cholecalciferol (VITAMIN D3) 25 MCG (1000 UT) CAPS Take 1,000 Units by mouth daily.     clopidogrel (PLAVIX) 75 MG tablet Take 1 tablet (75 mg total) by mouth daily. 30 tablet 11   Cyanocobalamin (B-12) 1000 MCG LOZG Take 1,000 mcg by mouth 2 (two) times daily.      ELIQUIS 5 MG TABS tablet TAKE 1 TABLET(5 MG) BY MOUTH TWICE DAILY 180 tablet 2   fluticasone (FLONASE) 50 MCG/ACT nasal spray Place 2 sprays into both nostrils daily as needed for allergies or rhinitis.     Icosapent Ethyl (VASCEPA) 1 g CAPS Take 2 capsules (2 g total) by mouth 2 (two) times daily. 120 capsule 11   losartan (COZAAR) 25 MG tablet Take 2 tablets (50 mg total) by mouth daily. 60 tablet 11   nitroGLYCERIN (NITROSTAT) 0.4 MG SL tablet Place 1 tablet (0.4 mg total) under the tongue every 5 (five) minutes as needed for chest pain (up to 3 doses). 25 tablet 6   pantoprazole (PROTONIX) 40 MG tablet Take 40 mg by mouth daily.     potassium chloride (K-DUR) 10 MEQ tablet Take 1 tablet (10 mEq total) by mouth daily. 90 tablet 3   propranolol (INDERAL) 10 MG tablet Take 1 tablet (10 mg total) by mouth 4 (four) times daily as needed. 60 tablet 11   spironolactone (ALDACTONE) 25 MG tablet Take 0.5 tablets (12.5 mg total) by mouth daily. 45 tablet 3   temazepam (RESTORIL) 15 MG capsule Take 15 mg by mouth at bedtime as needed for sleep.     No current facility-administered medications for this visit.      Allergies:   Isosorbide; Other; Effexor [venlafaxine]; Statins; and Zetia [ezetimibe]   Social History:  The patient  reports that she has never smoked. She has never used smokeless tobacco. She reports current alcohol use. She reports that she does not use drugs.   Family History:  The patient's  family history includes Alzheimer's disease in her mother; Breast cancer in her maternal aunt; Heart disease in her father.    ROS:  Please see the history of present illness.   All other systems are personally reviewed and negative.    Exam:    Vital Signs:  BP 106/72    Pulse (!) 56    Well appearing, alert and conversant, regular work of breathing,  good skin color Eyes- anicteric, neuro- grossly intact, skin- no apparent rash or lesions or cyanosis, mouth- oral mucosa is pink  Labs/Other Tests and Data Reviewed:    Recent Labs: 04/30/2018: TSH 3.49 06/17/2018: Hemoglobin 14.4; Platelets 194 07/31/2018: ALT 41 08/12/2018: BUN 12; Creatinine, Ser 1.01; Potassium 4.2; Sodium 137   Wt Readings from Last 3 Encounters:  08/01/18 137 lb 9.1 oz (62.4 kg)  07/31/18 137 lb 1.9 oz (62.2 kg)  07/23/18 134 lb (60.8 kg)     Other studies personally reviewed: Additional studies/ records that were reviewed today include: AF clinic notes,  Dr Elmarie Shiley notes  Review of the above records today demonstrates: as above Prior radiographs: CXR 06/17/2018- no acute air space disease ekg 07/15/2018 reveals sinus rhythm 58 bpm, PR 202 msec, QTc 416 msec Cath 06/14/2018 reveals CAD Echo 06/14/2018- EF 40%, normal size LA   ASSESSMENT & PLAN:    1.  Paroxysmal atrial fibrillation The patient has symptomatic, recurrent paroxysmal atrial fibrillation. she has failed medical therapy with propafenone and amiodarone. Chads2vasc score is 5.  she is anticoagulated with eliquis . Therapeutic strategies for afib including medicine and ablation were discussed in detail with the patient today. Risk, benefits, and  alternatives to EP study and radiofrequency ablation for afib were also discussed in detail today. These risks include but are not limited to stroke, bleeding, vascular damage, tamponade, perforation, damage to the esophagus, lungs, and other structures, pulmonary vein stenosis, worsening renal function, and death. The patient understands these risk and wishes to proceed.  We will therefore proceed with catheter ablation at the next available time.  Carto, ICE, anesthesia are requested for the procedure.  Will also obtain cardiac CT prior to the procedure to exclude LAA thrombus and further evaluate atrial anatomy. Reduce amiodarone to 100mg  daily today She will need lfts, tfts when she sees Dr Birdie Riddle or when see goes to the AF clinic in 3 months  2. OSA Previously used CPAP Has not tolerated CPAP Tried orthodontic prosthesis but did not tolerate this well.  3. CAD No ischemic sympoms No changes  4. Ischemic CM No symptoms of CHF  5. HL (familial) Reviewed at length with her today  6. COVID screen The patient does not have any symptoms that suggest any further testing/ screening at this time.  Social distancing reinforced today.   Current medicines are reviewed at length with the patient today.   The patient does not have concerns regarding her medicines.  The following changes were made today:  none  Labs/ tests ordered today include:  No orders of the defined types were placed in this encounter.   Patient Risk:  after full review of this patients clinical status, I feel that they are at moderate risk at this time.   Today, I have spent 30 minutes with the patient with telehealth technology discussing afib .    Follow-up with Butch Penny in the AF clinic for a virtual visit in 3 months  Signed, Thompson Grayer MD, [facility] 08/26/2018 4:20 PM   [place] [address] [place] [address] [phone] (office) [phone] (fax)

## 2018-08-28 ENCOUNTER — Ambulatory Visit (HOSPITAL_COMMUNITY): Payer: Medicare Other

## 2018-08-28 ENCOUNTER — Encounter (HOSPITAL_COMMUNITY): Payer: Medicare Other

## 2018-08-30 ENCOUNTER — Ambulatory Visit (HOSPITAL_COMMUNITY): Payer: Medicare Other

## 2018-08-30 ENCOUNTER — Encounter (HOSPITAL_COMMUNITY): Payer: Medicare Other

## 2018-09-02 ENCOUNTER — Encounter (HOSPITAL_COMMUNITY): Payer: Medicare Other

## 2018-09-02 ENCOUNTER — Ambulatory Visit (HOSPITAL_COMMUNITY): Payer: Medicare Other

## 2018-09-04 ENCOUNTER — Ambulatory Visit (HOSPITAL_COMMUNITY): Payer: Medicare Other

## 2018-09-04 ENCOUNTER — Encounter (HOSPITAL_COMMUNITY): Payer: Medicare Other

## 2018-09-06 ENCOUNTER — Encounter (HOSPITAL_COMMUNITY): Payer: Medicare Other

## 2018-09-06 ENCOUNTER — Ambulatory Visit (HOSPITAL_COMMUNITY): Payer: Medicare Other

## 2018-09-09 ENCOUNTER — Ambulatory Visit (HOSPITAL_COMMUNITY): Payer: Medicare Other

## 2018-09-09 ENCOUNTER — Encounter (HOSPITAL_COMMUNITY): Payer: Medicare Other

## 2018-09-11 ENCOUNTER — Ambulatory Visit (HOSPITAL_COMMUNITY): Payer: Medicare Other

## 2018-09-11 ENCOUNTER — Encounter (HOSPITAL_COMMUNITY): Payer: Medicare Other

## 2018-09-13 ENCOUNTER — Ambulatory Visit (HOSPITAL_COMMUNITY): Payer: Medicare Other

## 2018-09-13 ENCOUNTER — Encounter (HOSPITAL_COMMUNITY): Payer: Medicare Other

## 2018-09-16 ENCOUNTER — Ambulatory Visit (HOSPITAL_COMMUNITY): Payer: Medicare Other

## 2018-09-16 ENCOUNTER — Encounter (HOSPITAL_COMMUNITY): Payer: Medicare Other

## 2018-09-18 ENCOUNTER — Encounter (HOSPITAL_COMMUNITY): Payer: Medicare Other

## 2018-09-18 ENCOUNTER — Ambulatory Visit (HOSPITAL_COMMUNITY): Payer: Medicare Other

## 2018-09-20 ENCOUNTER — Encounter (HOSPITAL_COMMUNITY): Payer: Medicare Other

## 2018-09-20 ENCOUNTER — Ambulatory Visit (HOSPITAL_COMMUNITY): Payer: Medicare Other

## 2018-09-23 ENCOUNTER — Telehealth: Payer: Self-pay | Admitting: Family Medicine

## 2018-09-23 ENCOUNTER — Encounter (HOSPITAL_COMMUNITY): Payer: Medicare Other

## 2018-09-23 ENCOUNTER — Ambulatory Visit (HOSPITAL_COMMUNITY): Payer: Medicare Other

## 2018-09-23 NOTE — Telephone Encounter (Signed)
Pt states that her cardiologist handles her BP medications. She states that she doesn't feel the need to come in for an appt right now. However, she would like to know if she can have her BW done at our office? She states that her cardiologist will put the orders in for her.

## 2018-09-24 NOTE — Telephone Encounter (Signed)
As long as Cardiology puts in the orders we are able to draw them as long as she can come during our lab hours.

## 2018-09-25 ENCOUNTER — Ambulatory Visit (HOSPITAL_COMMUNITY): Payer: Medicare Other

## 2018-09-25 ENCOUNTER — Encounter (HOSPITAL_COMMUNITY): Payer: Medicare Other

## 2018-09-27 ENCOUNTER — Encounter (HOSPITAL_COMMUNITY): Payer: Medicare Other

## 2018-09-27 ENCOUNTER — Ambulatory Visit (HOSPITAL_COMMUNITY): Payer: Medicare Other

## 2018-09-30 ENCOUNTER — Encounter (HOSPITAL_COMMUNITY): Payer: Medicare Other

## 2018-09-30 ENCOUNTER — Ambulatory Visit (HOSPITAL_COMMUNITY): Payer: Medicare Other

## 2018-10-02 ENCOUNTER — Encounter (HOSPITAL_COMMUNITY): Payer: Medicare Other

## 2018-10-02 ENCOUNTER — Ambulatory Visit (HOSPITAL_COMMUNITY): Payer: Medicare Other

## 2018-10-04 ENCOUNTER — Ambulatory Visit (HOSPITAL_COMMUNITY): Payer: Medicare Other

## 2018-10-04 ENCOUNTER — Encounter (HOSPITAL_COMMUNITY): Payer: Medicare Other

## 2018-10-07 ENCOUNTER — Ambulatory Visit (HOSPITAL_COMMUNITY): Payer: Medicare Other

## 2018-10-07 ENCOUNTER — Encounter (HOSPITAL_COMMUNITY): Payer: Medicare Other

## 2018-10-09 ENCOUNTER — Encounter (HOSPITAL_COMMUNITY): Payer: Medicare Other

## 2018-10-09 ENCOUNTER — Ambulatory Visit (HOSPITAL_COMMUNITY): Payer: Medicare Other

## 2018-10-11 ENCOUNTER — Encounter (HOSPITAL_COMMUNITY): Payer: Medicare Other

## 2018-10-11 ENCOUNTER — Ambulatory Visit (HOSPITAL_COMMUNITY): Payer: Medicare Other

## 2018-10-14 ENCOUNTER — Ambulatory Visit (HOSPITAL_COMMUNITY): Payer: Medicare Other

## 2018-10-14 ENCOUNTER — Encounter (HOSPITAL_COMMUNITY): Payer: Medicare Other

## 2018-10-16 ENCOUNTER — Ambulatory Visit (HOSPITAL_COMMUNITY): Payer: Medicare Other

## 2018-10-16 ENCOUNTER — Encounter (HOSPITAL_COMMUNITY): Payer: Medicare Other

## 2018-10-18 ENCOUNTER — Encounter (HOSPITAL_COMMUNITY): Payer: Medicare Other

## 2018-10-18 ENCOUNTER — Ambulatory Visit (HOSPITAL_COMMUNITY): Payer: Medicare Other

## 2018-10-21 ENCOUNTER — Encounter (HOSPITAL_COMMUNITY): Payer: Medicare Other

## 2018-10-21 ENCOUNTER — Ambulatory Visit (HOSPITAL_COMMUNITY): Payer: Medicare Other

## 2018-10-23 ENCOUNTER — Encounter (HOSPITAL_COMMUNITY): Payer: Medicare Other

## 2018-10-23 ENCOUNTER — Ambulatory Visit (HOSPITAL_COMMUNITY): Payer: Medicare Other

## 2018-10-23 ENCOUNTER — Telehealth: Payer: Self-pay

## 2018-10-23 DIAGNOSIS — Z79899 Other long term (current) drug therapy: Secondary | ICD-10-CM

## 2018-10-23 DIAGNOSIS — I255 Ischemic cardiomyopathy: Secondary | ICD-10-CM

## 2018-10-23 DIAGNOSIS — I2510 Atherosclerotic heart disease of native coronary artery without angina pectoris: Secondary | ICD-10-CM

## 2018-10-23 DIAGNOSIS — I480 Paroxysmal atrial fibrillation: Secondary | ICD-10-CM

## 2018-10-23 NOTE — Telephone Encounter (Signed)
Pt denies experiencing any complications at this time. She would rather reschedule the appt out to August. Only concern pt had was about her labs for recent med changes on Praluent and Amiodarone and when she could get on the lab schedule.

## 2018-10-24 NOTE — Addendum Note (Signed)
Addended by: Emmaline Life on: 10/24/2018 03:50 PM   Modules accepted: Orders

## 2018-10-24 NOTE — Telephone Encounter (Signed)
Spoke with patient and advised that per [facility] pharmacist, lipid levels can be obtained anytime as long as she has had 4 doses or more of Praluent, which patient confirms. Patient states she also needs lab work for amiodarone therapy. Lab appointment scheduled for June 11 and patient aware to arrive fasting and to call back to reschedule if she is unable to keep this appointment. She thanked me for the call.

## 2018-10-25 ENCOUNTER — Ambulatory Visit (HOSPITAL_COMMUNITY): Payer: Medicare Other

## 2018-10-25 ENCOUNTER — Ambulatory Visit: Payer: Medicare Other | Admitting: Family Medicine

## 2018-10-25 ENCOUNTER — Encounter (HOSPITAL_COMMUNITY): Payer: Medicare Other

## 2018-10-29 ENCOUNTER — Ambulatory Visit: Payer: Medicare Other | Admitting: Cardiovascular Disease

## 2018-10-30 ENCOUNTER — Encounter (HOSPITAL_COMMUNITY): Payer: Medicare Other

## 2018-10-30 ENCOUNTER — Ambulatory Visit (HOSPITAL_COMMUNITY): Payer: Medicare Other

## 2018-10-31 ENCOUNTER — Telehealth: Payer: Self-pay

## 2018-11-01 ENCOUNTER — Encounter (HOSPITAL_COMMUNITY): Payer: Medicare Other

## 2018-11-01 ENCOUNTER — Ambulatory Visit (HOSPITAL_COMMUNITY): Payer: Medicare Other

## 2018-11-01 ENCOUNTER — Telehealth: Payer: Self-pay | Admitting: Family Medicine

## 2018-11-01 NOTE — Telephone Encounter (Signed)
Noted FWD to PCP. Lat OV with PCP was 04/30/2018. Pt due for BP and cholesterol follow up

## 2018-11-01 NOTE — Telephone Encounter (Signed)
Noted.  See that she had labs done w/ cards.  Cannot make pt come for visit if she is refusing

## 2018-11-01 NOTE — Telephone Encounter (Signed)
error 

## 2018-11-01 NOTE — Telephone Encounter (Signed)
Pt states that [facility] doesn't do her BP med and she doesn't need to come in for an appt right now

## 2018-11-04 ENCOUNTER — Encounter (HOSPITAL_COMMUNITY): Payer: Medicare Other

## 2018-11-04 ENCOUNTER — Ambulatory Visit (HOSPITAL_COMMUNITY): Payer: Medicare Other

## 2018-11-06 ENCOUNTER — Ambulatory Visit (HOSPITAL_COMMUNITY): Payer: Medicare Other

## 2018-11-06 ENCOUNTER — Encounter (HOSPITAL_COMMUNITY): Payer: Medicare Other

## 2018-11-08 ENCOUNTER — Other Ambulatory Visit: Payer: Self-pay | Admitting: Cardiovascular Disease

## 2018-11-13 ENCOUNTER — Telehealth: Payer: Self-pay

## 2018-11-13 NOTE — Telephone Encounter (Signed)
    COVID-19 Pre-Screening Questions:  . In the past 7 to 10 days have you had a cough,  shortness of breath, headache, congestion, fever (100 or greater) body aches, chills, sore throat, or sudden loss of taste or sense of smell? . Have you been around anyone with known Covid 19. . Have you been around anyone who is awaiting Covid 19 test results in the past 7 to 10 days? . Have you been around anyone who has been exposed to Covid 19, or has mentioned symptoms of Covid 19 within the past 7 to 10 days?  If you have any concerns/questions about symptoms patients report during screening (either on the phone or at threshold). Contact the provider seeing the patient or DOD for further guidance.  If neither are available contact a member of the leadership team.          Pt answered NO to all pre-screening questions. klb  [phone] 11/13/2018

## 2018-11-14 ENCOUNTER — Other Ambulatory Visit: Payer: Medicare Other | Admitting: *Deleted

## 2018-11-14 ENCOUNTER — Other Ambulatory Visit: Payer: Self-pay

## 2018-11-14 DIAGNOSIS — I480 Paroxysmal atrial fibrillation: Secondary | ICD-10-CM

## 2018-11-14 DIAGNOSIS — Z79899 Other long term (current) drug therapy: Secondary | ICD-10-CM

## 2018-11-14 DIAGNOSIS — I255 Ischemic cardiomyopathy: Secondary | ICD-10-CM

## 2018-11-14 DIAGNOSIS — I2510 Atherosclerotic heart disease of native coronary artery without angina pectoris: Secondary | ICD-10-CM | POA: Diagnosis not present

## 2018-11-15 LAB — BASIC METABOLIC PANEL
BUN/Creatinine Ratio: 13 (ref 12–28)
BUN: 12 mg/dL (ref 8–27)
CO2: 22 mmol/L (ref 20–29)
Calcium: 9.1 mg/dL (ref 8.7–10.3)
Chloride: 98 mmol/L (ref 96–106)
Creatinine, Ser: 0.9 mg/dL (ref 0.57–1.00)
GFR calc Af Amer: 74 mL/min/{1.73_m2} (ref >59)
GFR calc non Af Amer: 64 mL/min/{1.73_m2} (ref >59)
Glucose: 87 mg/dL (ref 65–99)
Potassium: 4.4 mmol/L (ref 3.5–5.2)
Sodium: 137 mmol/L (ref 134–144)

## 2018-11-15 LAB — HEPATIC FUNCTION PANEL
ALT: 61 IU/L — ABNORMAL HIGH (ref 0–32)
AST: 40 IU/L (ref 0–40)
Albumin: 4.6 g/dL (ref 3.7–4.7)
Alkaline Phosphatase: 60 IU/L (ref 39–117)
Bilirubin Total: 0.6 mg/dL (ref 0.0–1.2)
Bilirubin, Direct: 0.15 mg/dL (ref 0.00–0.40)
Total Protein: 7 g/dL (ref 6.0–8.5)

## 2018-11-15 LAB — LIPID PANEL
Chol/HDL Ratio: 3.5 ratio (ref 0.0–4.4)
Cholesterol, Total: 210 mg/dL — ABNORMAL HIGH (ref 100–199)
HDL: 60 mg/dL (ref >39)
LDL Calculated: 119 mg/dL — ABNORMAL HIGH (ref 0–99)
Triglycerides: 157 mg/dL — ABNORMAL HIGH (ref 0–149)
VLDL Cholesterol Cal: 31 mg/dL (ref 5–40)

## 2018-11-15 LAB — TSH: TSH: 4.79 u[IU]/mL — ABNORMAL HIGH (ref 0.450–4.500)

## 2018-11-19 ENCOUNTER — Telehealth: Payer: Self-pay | Admitting: Pharmacist

## 2018-11-19 NOTE — Telephone Encounter (Signed)
Called pt to discuss lipid results, spent 25 mins on phone with pt. LDL improved from 190 to 119, discussed increasing Praluent dose to target LDL < 70 however pt states she has been experiencing some aches and weakness in her legs like she previously experienced with statins and Zetia. She also reports a 1 month supply of her Praluent costs $209 with her [employer].  Discussed trying Repatha. She is agreeable and will pick up 2 samples tomorrow for the month of July. She will let us know how she tolerates Repatha. Other option would be pursuing ORION-4 trial as Nexletol will not bring baseline LDL of 190 close to goal.

## 2018-11-26 ENCOUNTER — Ambulatory Visit (HOSPITAL_COMMUNITY): Payer: Medicare Other | Admitting: Nurse Practitioner

## 2019-01-05 ENCOUNTER — Other Ambulatory Visit: Payer: Self-pay | Admitting: Family Medicine

## 2019-01-20 ENCOUNTER — Other Ambulatory Visit: Payer: Self-pay

## 2019-01-20 ENCOUNTER — Encounter: Payer: Medicare Other | Admitting: *Deleted

## 2019-01-20 VITALS — BP 137/72 | HR 58 | Temp 96.8°F | Wt 132.8 lb

## 2019-01-20 DIAGNOSIS — Z006 Encounter for examination for normal comparison and control in clinical research program: Secondary | ICD-10-CM

## 2019-01-20 NOTE — Research (Addendum)
Patient to clinic for visit [phone] in the clear research study. No cos, aes or saes to report. Subject re-consented to [place] version 7.0 12Mar2020. Local 15Apr2020.   Clear Informed Consent   Subject Name: Shannon Cummings   Subject met inclusion and exclusion criteria. The informed consent form, study requirements and expectations were reviewed with the subject and questions and concerns were addressed prior to the signing of the consent form. The subject verbalized understanding of the trial requirements. The subject agreed to participate in the [place] rtrial and signed the informed consent on 17Aug2020. The informed consent was obtained prior to performance of any protocol-specific procedures for the subject. A copy of the signed informed consent was given to the subject and a copy was placed in the subject's medical record.   Oletta Cohn.   Next phone call and clinic visit scheduled.

## 2019-01-21 ENCOUNTER — Telehealth: Payer: Self-pay | Admitting: Family Medicine

## 2019-01-21 NOTE — Telephone Encounter (Signed)
I am unable to see any recent urine tests in patients chart. Please advise.

## 2019-01-21 NOTE — Telephone Encounter (Signed)
Spoke with patient, with the cholesterol study she participates in with [facility], they take urine samples every visit. C/o urinary discomfort and the nurse running the program cultured the urine yesterday. Patient stated that she is nervous with coming into the office due to [place] with her age. Requesting that script be called in after culture comes back possibly tomorrow. Please advise. Stated that she is having slight relief when taking AZO.

## 2019-01-21 NOTE — Telephone Encounter (Signed)
Reviewed in PCP absence. There is not documented urine testing recently in her chart. She would need appointment for treatment -- in office or video

## 2019-01-21 NOTE — Telephone Encounter (Signed)
Pt called in to be advised. Pt says that she has been having UTI symptoms. Frequent urination, discomfort below. Pt says that she is currently taking a Cholesterol study at [facility] and had a urine collected there. Pt says that she was told that PCP is able to see result. Pt would like to know if PCP would advised /assist further with a Rx?    Pharmacy:  [facility] [place], [place] - [phone] [place] [address] OF [place] [address] [phone] (Phone) [phone] (Fax)   CB: [phone]

## 2019-01-22 ENCOUNTER — Telehealth: Payer: Self-pay

## 2019-01-22 NOTE — Telephone Encounter (Signed)
I spoke with patient regarding dysuria and frequency that she called in c/o yesterday. Patient stated that an urine culture had been sent in thru the provider of the current Cholesterol Study that she is in with [facility]. After reviewing her chart, we are unable to see any urine culture pending results or ordered. After explaining all of this to the patient and asking if she would be willing to schedule a VV because of concerned voiced over COVID-19 due to her age, patient became upset that we could not just culture her urine and send something into the pharmacy. I stated that an urinalysis and culture cannot be scheduled as just a nurse visit, that she had to see a provider in our office. Patient stated " I have already told you all my symptoms, this is just a way for your office to get money. My husband is on the way to get a urine cup for a sample" and phone call disconnected.

## 2019-01-22 NOTE — Telephone Encounter (Signed)
PCP is out of office. I have not seen patient. There are no orders in the system from the encounter with the cholesterol study that I can see. If they did order a culture then the provider that signed that order should treat her infection if present. I do not give antibiotics without some form of assessment. If husband does arrive for urine cup, ok to give to him and we can order a UA and culture but I will not treat until results are in.

## 2019-01-23 ENCOUNTER — Encounter: Payer: Self-pay | Admitting: Family Medicine

## 2019-01-23 ENCOUNTER — Ambulatory Visit (INDEPENDENT_AMBULATORY_CARE_PROVIDER_SITE_OTHER): Payer: Medicare Other | Admitting: Family Medicine

## 2019-01-23 ENCOUNTER — Other Ambulatory Visit: Payer: Medicare Other

## 2019-01-23 ENCOUNTER — Other Ambulatory Visit: Payer: Self-pay

## 2019-01-23 VITALS — BP 121/80

## 2019-01-23 DIAGNOSIS — N3000 Acute cystitis without hematuria: Secondary | ICD-10-CM

## 2019-01-23 DIAGNOSIS — R350 Frequency of micturition: Secondary | ICD-10-CM | POA: Diagnosis not present

## 2019-01-23 DIAGNOSIS — R399 Unspecified symptoms and signs involving the genitourinary system: Secondary | ICD-10-CM | POA: Diagnosis not present

## 2019-01-23 DIAGNOSIS — R300 Dysuria: Secondary | ICD-10-CM | POA: Diagnosis not present

## 2019-01-23 LAB — POCT URINALYSIS DIPSTICK
Bilirubin, UA: NEGATIVE
Blood, UA: POSITIVE
Glucose, UA: NEGATIVE
Ketones, UA: NEGATIVE
Nitrite, UA: NEGATIVE
Protein, UA: NEGATIVE
Spec Grav, UA: 1.025 (ref 1.010–1.025)
Urobilinogen, UA: 1 E.U./dL
pH, UA: 6 (ref 5.0–8.0)

## 2019-01-23 MED ORDER — CIPROFLOXACIN HCL 500 MG PO TABS: 500.0000 mg | ORAL_TABLET | Freq: Two times a day (BID) | ORAL | 0 refills | Status: AC

## 2019-01-23 NOTE — Progress Notes (Signed)
Virtual Visit via Video Note  Subjective  CC:  Chief Complaint  Patient presents with  . Urinary Tract Infection    Sxs started over 1 week ago.. C/o dysuria, lower back pain, urgency, and frequency.. Has not tried anything     I connected with Corey Harold on 01/23/19 at  4:20 PM EDT by a video enabled telemedicine application and verified that I am speaking with the correct person using two identifiers. Location patient: Home Location provider: [facility] [place], Office Persons participating in the virtual visit: Thanh Pomerleau, Leamon Arnt, MD Lilli Light, CMA Same day acute visit; PCP not available. New pt to me. Chart reviewed.   I discussed the limitations of evaluation and management by telemedicine and the availability of in person appointments. The patient expressed understanding and agreed to proceed. HPI: Shannon Cummings is a 73 y.o. female who was contacted today to address the problems listed above in the chief complaint. . Pt reports urinary frequency and dysuria worsening over the last several days in spite of increasing fluid intake. No f/c/s, flank pain n/v or vaginal sxs. Has had uti in past, not recently, and feels the same. No h/o pyelo. Otherwise feels well.  Assessment  1. Acute cystitis without hematuria      Plan   UTI:  Eduction; cipro x 5 days. Await culture.  I discussed the assessment and treatment plan with the patient. The patient was provided an opportunity to ask questions and all were answered. The patient agreed with the plan and demonstrated an understanding of the instructions.   The patient was advised to call back or seek an in-person evaluation if the symptoms worsen or if the condition fails to improve as anticipated. Follow up: prn  Visit date not found  Meds ordered this encounter  Medications  . ciprofloxacin (CIPRO) 500 MG tablet    Sig: Take 1 tablet (500 mg total) by mouth 2 (two) times daily for  7 days.    Dispense:  14 tablet    Refill:  0      I reviewed the patients updated PMH, FH, and SocHx.    Patient Active Problem List   Diagnosis Date Noted  . Sinus bradycardia 06/15/2018  . Ischemic cardiomyopathy 06/15/2018  . NSTEMI (non-ST elevated myocardial infarction) ([place]) 06/13/2018  . GERD (gastroesophageal reflux disease) 07/10/2016  . History of colonic polyps 02/25/2016  . Antiplatelet or antithrombotic long-term use 02/25/2016  . Nodule of right lung 10/13/2012  . History of non-ST elevation myocardial infarction (NSTEMI) 08/14/2012  . Hepatic steatosis 12/03/2011  . Paroxysmal atrial fibrillation ([place]) 12/22/2010  . CAD (coronary artery disease) 12/22/2010  . INSOMNIA 04/13/2010  . Hyperlipidemia 03/19/2010  . Essential hypertension 03/19/2010  . Obstructive sleep apnea 03/17/2010   Current Meds  Medication Sig  . acetaminophen (TYLENOL) 325 MG tablet Take 325-650 mg by mouth every 8 (eight) hours as needed (for pain).   . Alirocumab (PRALUENT) 75 MG/ML SOAJ Inject 75 mg into the skin every 14 (fourteen) days.  . AMBULATORY NON FORMULARY MEDICATION Take 180 mg by mouth daily. Medication Name: bempedoic acid 180 mg vs a placebo.  CLEAR Research Study, drug provided  . amiodarone (PACERONE) 200 MG tablet Take 0.5 tablets (100 mg total) by mouth daily.  . Cholecalciferol (VITAMIN D3) 25 MCG (1000 UT) CAPS Take 1,000 Units by mouth daily.  . clopidogrel (PLAVIX) 75 MG tablet Take 1 tablet (75 mg total) by mouth daily.  Marland Kitchen  Cyanocobalamin (B-12) 1000 MCG LOZG Take 1,000 mcg by mouth 2 (two) times daily.   Marland Kitchen ELIQUIS 5 MG TABS tablet TAKE 1 TABLET(5 MG) BY MOUTH TWICE DAILY  . fluticasone (FLONASE) 50 MCG/ACT nasal spray Place 2 sprays into both nostrils daily as needed for allergies or rhinitis.  Vanessa Kick Ethyl (VASCEPA) 1 g CAPS Take 2 capsules (2 g total) by mouth 2 (two) times daily.  Marland Kitchen losartan (COZAAR) 25 MG tablet Take 2 tablets (50 mg total) by mouth daily.  .  pantoprazole (PROTONIX) 40 MG tablet TAKE 1 TABLET(40 MG) BY MOUTH DAILY  . potassium chloride (K-DUR) 10 MEQ tablet TAKE 1 TABLET(10 MEQ) BY MOUTH DAILY  . propranolol (INDERAL) 10 MG tablet Take 1 tablet (10 mg total) by mouth 4 (four) times daily as needed.  Marland Kitchen spironolactone (ALDACTONE) 25 MG tablet Take 0.5 tablets (12.5 mg total) by mouth daily.    Allergies: Patient is allergic to isosorbide; other; effexor [venlafaxine]; statins; and zetia [ezetimibe]. Family History: Patient family history includes Alzheimer's disease in her mother; Breast cancer in her maternal aunt; Heart disease in her father. Social History:  Patient  reports that she has never smoked. She has never used smokeless tobacco. She reports current alcohol use. She reports that she does not use drugs.  Review of Systems: Constitutional: Negative for fever malaise or anorexia Cardiovascular: negative for chest pain Respiratory: negative for SOB or persistent cough Gastrointestinal: negative for abdominal pain  OBJECTIVE Vitals: BP 121/80  General: no acute distress , A&Ox3 Office Visit on 01/23/2019  Component Date Value Ref Range Status  . Color, UA 01/23/2019 Yellow   Final  . Clarity, UA 01/23/2019 Clear   Final  . Glucose, UA 01/23/2019 Negative  Negative Final  . Bilirubin, UA 01/23/2019 Negative   Final  . Ketones, UA 01/23/2019 Negative   Final  . Spec Grav, UA 01/23/2019 1.025  1.010 - 1.025 Final  . Blood, UA 01/23/2019 Positive   Final  . pH, UA 01/23/2019 6.0  5.0 - 8.0 Final  . Protein, UA 01/23/2019 Negative  Negative Final  . Urobilinogen, UA 01/23/2019 1.0  0.2 or 1.0 E.U./dL Final  . Nitrite, UA 01/23/2019 Negative   Final  . Leukocytes, UA 01/23/2019 Small (1+)* Negative Final    Leamon Arnt, MD

## 2019-01-24 LAB — URINE CULTURE
MICRO NUMBER:: 793501
SPECIMEN QUALITY:: ADEQUATE

## 2019-01-31 ENCOUNTER — Ambulatory Visit: Payer: Medicare Other | Admitting: Cardiovascular Disease

## 2019-02-19 ENCOUNTER — Encounter: Payer: Self-pay | Admitting: Gastroenterology

## 2019-03-04 ENCOUNTER — Encounter: Payer: Self-pay | Admitting: Family Medicine

## 2019-03-04 ENCOUNTER — Other Ambulatory Visit: Payer: Self-pay

## 2019-03-04 ENCOUNTER — Ambulatory Visit (INDEPENDENT_AMBULATORY_CARE_PROVIDER_SITE_OTHER): Payer: Medicare Other | Admitting: Family Medicine

## 2019-03-04 VITALS — BP 110/80 | HR 55 | Temp 97.5°F | Resp 16 | Ht 60.0 in | Wt 135.5 lb

## 2019-03-04 DIAGNOSIS — R809 Proteinuria, unspecified: Secondary | ICD-10-CM | POA: Diagnosis not present

## 2019-03-04 DIAGNOSIS — I255 Ischemic cardiomyopathy: Secondary | ICD-10-CM | POA: Diagnosis not present

## 2019-03-04 DIAGNOSIS — R339 Retention of urine, unspecified: Secondary | ICD-10-CM | POA: Diagnosis not present

## 2019-03-04 DIAGNOSIS — Z23 Encounter for immunization: Secondary | ICD-10-CM | POA: Diagnosis not present

## 2019-03-04 DIAGNOSIS — R319 Hematuria, unspecified: Secondary | ICD-10-CM

## 2019-03-04 DIAGNOSIS — R300 Dysuria: Secondary | ICD-10-CM

## 2019-03-04 LAB — POCT URINALYSIS DIPSTICK
Bilirubin, UA: NEGATIVE
Glucose, UA: NEGATIVE
Ketones, UA: NEGATIVE
Nitrite, UA: NEGATIVE
Protein, UA: POSITIVE — AB
Spec Grav, UA: 1.02 (ref 1.010–1.025)
Urobilinogen, UA: 0.2 E.U./dL
pH, UA: 6 (ref 5.0–8.0)

## 2019-03-04 MED ORDER — CEPHALEXIN 500 MG PO CAPS: 500.0000 mg | ORAL_CAPSULE | Freq: Two times a day (BID) | ORAL | 0 refills | Status: AC

## 2019-03-04 NOTE — Progress Notes (Signed)
   Subjective:    Patient ID: Shannon Cummings, female    DOB: 12/25/1945, 73 y.o.   MRN: [id number]  HPI Dysuria- pt was seen on 8/20 and prescribed Cipro x5 days.  Sxs resolved until this AM when 'boom all of a sudden'.  Pt reports she is having difficulty emptying bladder.  + frequency, urgency.  No fever.  No N/V.  No CVA tenderness.  Denies suprapubic pressure.   Review of Systems For ROS see HPI     Objective:   Physical Exam Vitals signs reviewed.  Constitutional:      General: She is not in acute distress.    Appearance: Normal appearance. She is well-developed.  Abdominal:     General: There is no distension.     Palpations: Abdomen is soft.     Tenderness: There is no abdominal tenderness (no suprapubic or CVA tenderness).  Neurological:     General: No focal deficit present.     Mental Status: She is alert and oriented to person, place, and time.  Psychiatric:        Mood and Affect: Mood normal.        Behavior: Behavior normal.           Assessment & Plan:  Dysuria- new to provider, pt reports this feels similar to last infection.  UA highly suspicious for infxn.  Start Keflex.  Send urine for culture to ensure appropriate tx.  Pt expressed understanding and is in agreement w/ plan.   Incomplete emptying of bladder- ongoing issue for pt.  Refer to Urology.  Pt expressed understanding and is in agreement w/ plan.

## 2019-03-04 NOTE — Patient Instructions (Signed)
Follow up as needed or as scheduled Take the Cephalexin (Keflex) twice daily as directed Drink LOTS of fluids We'll call you with your urology referral Call with any questions or concerns Hang in there!

## 2019-03-05 ENCOUNTER — Ambulatory Visit: Payer: Medicare Other

## 2019-03-06 LAB — URINE CULTURE
MICRO NUMBER:: 937230
SPECIMEN QUALITY:: ADEQUATE

## 2019-03-14 DIAGNOSIS — Z1231 Encounter for screening mammogram for malignant neoplasm of breast: Secondary | ICD-10-CM | POA: Diagnosis not present

## 2019-03-14 DIAGNOSIS — Z803 Family history of malignant neoplasm of breast: Secondary | ICD-10-CM | POA: Diagnosis not present

## 2019-03-16 NOTE — Progress Notes (Deleted)
Cardiology Office Note   Date:  03/16/2019   ID:  Shannon Cummings, DOB 18-Jul-1945, MRN [id number]  PCP:  [name], MD  Cardiologist:   [name], MD   No chief complaint on file.  1. Coronary artery disease-status post stenting in the past. She status post stenting of her proximal LAD 08/14/2012 2. Intermittent atrial fibrillation 3.  hyperlipidemia-she has been generally intolerant to most statins   Pt is doing well. No cardiac complaints.   She complains of some tingling and numbness associated with the Crestor. She has discontinued the Crestor because of that reason. She has had reactions to most statins that we have tried.  October 08-2011  She has done well. She has not had any angina or eposides of atrial fib. She has retired and is traveling quite a bit. Her husband has also retired. She has not had any problems.  October 01, 2012:  She is having some bruising ( due to [place]). She is enjoying the cardiac rehab. She has noticed some low BP readings at the end of cardiac rehab. She does not feel bad. These low readings are typically asymptomatic.   No angina. No arrhythmias.   August 20, 2013:  Shannon Cummings is doing ok. She was admitted to the hospital with some CP recently. Rule out for MI.  She has been doing [place]. Has gained a bit of weight. She is not able to exercise because Of left ankle problems.    August 19, 2014:   Shannon Cummings is a 73 y.o. female who presents for follow up of her atrial fib. Has gained some weight.  She is in the study for lipid management Has had leg cramps since last fall. Off and on  Has had more paroxysmal atrial fib.  Lasts about a minute. Seem to be occuring more frequently.   Jan. 18, 2017:  Shannon Cummings presents for follow up of her CAD and atrial fib.  Doing well.   Has visited [place] recently .   No CP or dyspnea.   Tolerates the Rythmol.  Talked about her daughter who lives in [place].   Is not having any significant atrial fib issues.   Has been on lipid lowering study drug at [facility]  ( injectable cholesterol medication )   Jan.   25, 2018:  Doing well.   Feeling well Has had a stomach bug last week and now has a URI  - not the flu.   Has maintained NSR .  Is having hair loss.  Her research shows that amlodipine and atenolol can cause this   Aug. 23, 2018:  Shannon Cummings is seen today for eval of some elevated BP Has been having pain in her back, tightness in her chest  Went to ER.  Has had elevated BP for a week,   Went back to ER  Doubled her metoprolol XL .  Saw Richardson Dopp on Aug. 6. 2018 Was given Imdur - developed a headache that lasted 3 days  Lexiscan myoview was low risk.   Her back pain / chest tightness has resolved.   May 08, 2017: Shannon Cummings seen today for follow-up of her hypertension, coronary artery disease, paroxysmal atrial fibrillation. Is feeling well  Is taking the Toprol XL just once a day - not BID as listed in MAR .   August 15, 2017   Shannon Cummings is doing well BP is much better.  Is exercising and watching her diet.   Is on a study drug for cholesterol  June 18, 2018: He was admitted to the hospital recently with a non-ST segment elevation myocardial infarction. The previously placed LAD stent was widely patent.  She had a tight stenosis in her first obtuse marginal artery that was stented. Function is mildly reduced with an EF of 45 to 50%.  She returned to the ER the day after she went home with an episode of rapid AF and CP .    Troponin was  0.18  Her rhythmol has been stopped . Saw Roderic Palau and was started on Amiodarone . The plan is for short term Amio and then consider AF ablation . They also discussed Tikosyn .  She tolerates her AF poorly.  We discussed taking short acting beta blockers if she has AF with CP  Had been walking ,   Has been weak since the MI.    Feb. 26, 2020  Shannon Cummings is seen for follow up of  her CAD and PAF Has had some shortness of breath . BP is a bit elevated today  amio has been reduced to 200 mg a day  Has an appt with Dr. Rayann Heman on March 23 to discuss ablation  Is in a lipid study   Oct. 12, 2020   Shannon Cummings is seen today for follow up of her CAD, atrial fib She was scheduled to see Dr. Rayann Heman for consideration of ablation in March .  She has hyperlipidemia and is generally intolerant to statins   Past Medical History:  Diagnosis Date  . Arthritis   . CAD (coronary artery disease)    a. prior stenting history. b. NSTEMI s/p DES to prox LAD with EF 45% by cath 08/14/12. c. Mild trop elevation shortly after cath ?mild plaque embolization - patent stent on relook. // d. Myoview 8/18: EF 60, normal perfusion; Low Risk. e. NSTEMI 06/2018- patent prior LAD stent, 80% OM1 s/p DES, normal LVEDP, EF 45-50%, moderate residual disease treated medically.  . Fatty infiltration of liver   . GERD (gastroesophageal reflux disease)   . Hyperlipidemia   . Hypertension   . Hypokalemia   . Hypotension   . Myocardial infarction ([place]) 2014  . OSA on CPAP   . Osteoporosis   . PAF (paroxysmal atrial fibrillation) ([place])    On rythmol previously  . Pulmonary nodule    a. 47mm subpleural nodular density CT 08/2012, instructed to f/u pulm MD.  . Sinus bradycardia   . Tubular adenoma of colon 2007    Past Surgical History:  Procedure Laterality Date  . CORONARY ANGIOPLASTY WITH STENT PLACEMENT  08/14/2012   LAD  Dr [name]  . CORONARY STENT INTERVENTION N/A 06/14/2018   Procedure: CORONARY STENT INTERVENTION;  Surgeon: Jettie Booze, MD;  Location: [place] [facility] LAB;  Service: Cardiovascular;  Laterality: N/A;  . CORONARY STENT PLACEMENT  2000  . hair restoration  02/2017  . LEFT HEART CATH AND CORONARY ANGIOGRAPHY N/A 06/14/2018   Procedure: LEFT HEART CATH AND CORONARY ANGIOGRAPHY;  Surgeon: Jettie Booze, MD;  Location: [place] [facility];  Service: Cardiovascular;   Laterality: N/A;  . LEFT HEART CATHETERIZATION WITH CORONARY ANGIOGRAM N/A 08/14/2012   Procedure: LEFT HEART CATHETERIZATION WITH CORONARY ANGIOGRAM;  Surgeon: [name], MD;  Location: [facility] CATH LAB;  Service: Cardiovascular;  Laterality: N/A;  . LEFT HEART CATHETERIZATION WITH CORONARY ANGIOGRAM N/A 08/19/2012   Procedure: LEFT HEART CATHETERIZATION WITH CORONARY ANGIOGRAM;  Surgeon: [name], MD;  Location: [facility] CATH LAB;  Service: Cardiovascular;  Laterality:  N/A;  . ROTATOR CUFF REPAIR    . TONSILLECTOMY    . TOTAL ABDOMINAL HYSTERECTOMY       Current Outpatient Medications  Medication Sig Dispense Refill  . acetaminophen (TYLENOL) 325 MG tablet Take 325-650 mg by mouth every 8 (eight) hours as needed (for pain).     . Alirocumab (PRALUENT) 75 MG/ML SOAJ Inject 75 mg into the skin every 14 (fourteen) days. 2 pen 11  . AMBULATORY NON FORMULARY MEDICATION Take 180 mg by mouth daily. Medication Name: bempedoic acid 180 mg vs a placebo.  CLEAR Research Study, drug provided    . amiodarone (PACERONE) 200 MG tablet Take 0.5 tablets (100 mg total) by mouth daily. 45 tablet 3  . Cholecalciferol (VITAMIN D3) 25 MCG (1000 UT) CAPS Take 1,000 Units by mouth daily.    . clopidogrel (PLAVIX) 75 MG tablet Take 1 tablet (75 mg total) by mouth daily. 30 tablet 11  . Cyanocobalamin (B-12) 1000 MCG LOZG Take 1,000 mcg by mouth 2 (two) times daily.     Marland Kitchen ELIQUIS 5 MG TABS tablet TAKE 1 TABLET(5 MG) BY MOUTH TWICE DAILY 180 tablet 2  . fluticasone (FLONASE) 50 MCG/ACT nasal spray Place 2 sprays into both nostrils daily as needed for allergies or rhinitis.    Vanessa Kick Ethyl (VASCEPA) 1 g CAPS Take 2 capsules (2 g total) by mouth 2 (two) times daily. 120 capsule 11  . losartan (COZAAR) 25 MG tablet Take 2 tablets (50 mg total) by mouth daily. 60 tablet 11  . nitroGLYCERIN (NITROSTAT) 0.4 MG SL tablet Place 1 tablet (0.4 mg total) under the tongue every 5 (five) minutes as needed for chest pain (up to  3 doses). 25 tablet 6  . pantoprazole (PROTONIX) 40 MG tablet TAKE 1 TABLET(40 MG) BY MOUTH DAILY 90 tablet 0  . potassium chloride (K-DUR) 10 MEQ tablet TAKE 1 TABLET(10 MEQ) BY MOUTH DAILY 90 tablet 2  . propranolol (INDERAL) 10 MG tablet Take 1 tablet (10 mg total) by mouth 4 (four) times daily as needed. 60 tablet 11  . spironolactone (ALDACTONE) 25 MG tablet Take 0.5 tablets (12.5 mg total) by mouth daily. 45 tablet 3   No current facility-administered medications for this visit.     Allergies:   Isosorbide, Other, Effexor [venlafaxine], Statins, and Zetia [ezetimibe]    Social History:  The patient  reports that she has never smoked. She has never used smokeless tobacco. She reports current alcohol use. She reports that she does not use drugs.   Family History:  The patient's family history includes Alzheimer's disease in her mother; Breast cancer in her maternal aunt; Heart disease in her father.    ROS: Noted in current history.  All other systems are negative.  Physical Exam: There were no vitals taken for this visit.  GEN:  Well nourished, well developed in no acute distress HEENT: Normal NECK: No JVD; No carotid bruits LYMPHATICS: No lymphadenopathy CARDIAC: RRR ***, no murmurs, rubs, gallops RESPIRATORY:  Clear to auscultation without rales, wheezing or rhonchi  ABDOMEN: Soft, non-tender, non-distended MUSCULOSKELETAL:  No edema; No deformity  SKIN: Warm and dry NEUROLOGIC:  Alert and oriented x 3   EKG:   .    Recent Labs: 06/17/2018: Hemoglobin 14.4; Platelets 194 11/14/2018: ALT 61; BUN 12; Creatinine, Ser 0.90; Potassium 4.4; Sodium 137; TSH 4.790    Lipid Panel    Component Value Date/Time   CHOL 210 (H) 11/14/2018 1210   TRIG 157 (H) 11/14/2018 1210  HDL 60 11/14/2018 1210   CHOLHDL 3.5 11/14/2018 1210   CHOLHDL 5.8 (H) 06/23/2015 1153   VLDL 43 (H) 06/23/2015 1153   LDLCALC 119 (H) 11/14/2018 1210   LDLDIRECT 174.0 12/31/2012 0754      Wt  Readings from Last 3 Encounters:  03/04/19 135 lb 8 oz (61.5 kg)  01/20/19 132 lb 12.8 oz (60.2 kg)  08/01/18 137 lb 9.1 oz (62.4 kg)      Other studies Reviewed: Additional studies/ records that were reviewed today include: . Review of the above records demonstrates:    ASSESSMENT AND PLAN:  1. Coronary artery disease-  ***  2. Intermittent atrial fibrillation -     ***  3.  hyperlipidemia-  ***  4. Essential HTN:     -***  . I will see her again in 3 months.   Current medicines are reviewed at length with the patient today.  The patient does not have concerns regarding medicines.  The following changes have been made:  no change  Labs/ tests ordered today include:   No orders of the defined types were placed in this encounter.   Signed, [name], MD  03/16/2019 9:06 PM    [facility] [address], [place], [place]  [address] Phone: [phone]; Fax: [phone]

## 2019-03-17 ENCOUNTER — Ambulatory Visit: Payer: Medicare Other | Admitting: Cardiovascular Disease

## 2019-03-26 ENCOUNTER — Emergency Department (HOSPITAL_BASED_OUTPATIENT_CLINIC_OR_DEPARTMENT_OTHER)
Admission: EM | Admit: 2019-03-26 | Discharge: 2019-03-26 | Disposition: A | Discharge status: Home / Self Care | Payer: Medicare Other | Attending: Emergency Medicine | Admitting: Emergency Medicine

## 2019-03-26 ENCOUNTER — Telehealth: Payer: Self-pay | Admitting: Family Medicine

## 2019-03-26 ENCOUNTER — Encounter (HOSPITAL_BASED_OUTPATIENT_CLINIC_OR_DEPARTMENT_OTHER): Payer: Self-pay | Admitting: Emergency Medicine

## 2019-03-26 ENCOUNTER — Other Ambulatory Visit: Payer: Self-pay

## 2019-03-26 DIAGNOSIS — Z79899 Other long term (current) drug therapy: Secondary | ICD-10-CM | POA: Diagnosis not present

## 2019-03-26 DIAGNOSIS — I10 Essential (primary) hypertension: Secondary | ICD-10-CM | POA: Insufficient documentation

## 2019-03-26 DIAGNOSIS — R05 Cough: Secondary | ICD-10-CM | POA: Insufficient documentation

## 2019-03-26 DIAGNOSIS — R519 Headache, unspecified: Secondary | ICD-10-CM | POA: Diagnosis not present

## 2019-03-26 DIAGNOSIS — I2510 Atherosclerotic heart disease of native coronary artery without angina pectoris: Secondary | ICD-10-CM | POA: Diagnosis not present

## 2019-03-26 LAB — SEDIMENTATION RATE: Sed Rate: 5 mm/hr (ref 0–22)

## 2019-03-26 MED ORDER — GABAPENTIN 300 MG PO CAPS
ORAL_CAPSULE | ORAL | Status: AC
  Administered 2019-03-26: 300 mg
  Filled 2019-03-26: qty 1

## 2019-03-26 MED ORDER — GABAPENTIN 600 MG PO TABS
300.0000 mg | ORAL_TABLET | Freq: Once | ORAL | Status: DC
  Filled 2019-03-26: qty 0.5

## 2019-03-26 MED ORDER — GABAPENTIN 300 MG PO CAPS: 300.0000 mg | ORAL_CAPSULE | Freq: Three times a day (TID) | ORAL | 0 refills | Status: DC

## 2019-03-26 NOTE — Telephone Encounter (Signed)
Please advise? Should pt have a VV?

## 2019-03-26 NOTE — Telephone Encounter (Signed)
Agree w/ advice given 

## 2019-03-26 NOTE — Discharge Instructions (Signed)
I suspect you have a condition called trigeminal neuralgia. This is not a life threatening problem but the pain can be severe. I do not have a specific test I can do in the ER to completely rule it in or out, but your symptoms seem to be consistent with it. Your blood test today rules out temporal (giant cell) arteritis. You are being prescribed a medication to help with your symptoms. If you are having persist pain beyond a week then I would speak further with your PCP. You may need to see a neurologist if it does not improve. I do not think you symptoms are COVID related.

## 2019-03-26 NOTE — ED Notes (Signed)
Pt states that her husband was recently positive for covid, states that she has been having a sharp headache the comes and goes with pain behind her left eye. Pt states that she has also been having some fatigue and dizziness.

## 2019-03-26 NOTE — ED Triage Notes (Signed)
Pt reports cough , chills, fatigue x 1 week, headache started at 3  am this morning, spouse tested positive for Covid-19  3 days . Pt was with spouse at all the time. Denies shortness nor chest pain. Nausea yet no emesis , no loss of taste nor smell and oriented x 4.

## 2019-03-26 NOTE — Telephone Encounter (Signed)
Please verify with pt that 1) she has COVID.  2) what type of pain she is experiencing.  If she is having a sharp stabbing pain in her head, any visual changes, dizziness, weakness/numbness, etc she needs to go to the ER.  Otherwise for a HA, she can take tylenol or ibuprofen

## 2019-03-26 NOTE — ED Notes (Signed)
ED Provider at bedside. 

## 2019-03-26 NOTE — Telephone Encounter (Signed)
Pt called in stating that today she is having a sharpe pain on her left temple. She states that her husband tested positive for COVID and they told her she had it too because she tested positive. She wanted to know what she could take for this. Pt can be reached at the home #

## 2019-03-26 NOTE — Telephone Encounter (Signed)
Called and spoke with pt. She advised that she was told by a nurse to NOT come in to get COVID testing as with her symptoms the likelihood of her NOT having COVID was slim to none. PT is complaining of Sharp, stabbing pain in her left temple since 3am. It happens 2-3x every 1/2 hour and is debilitating. Pt states that she has had dizziness for the last week and feels this is related to her medication, she is very weak, has chills, dry cough, and is so fatigued from the moment she wakes up in the morning. She complains of SOB with exertion, has a 2 story home and is having difficulty climbing the stairs. Pt denies congestion but states that her chest feels very tight.   Pt was advised to seek treatment at ER as we cannot rule out something more sever with the head pain, SOB, fatigue, and chest pain.   Pt stated an understanding and was going to be driven to hospital. Pt advised that if symptoms changed she needed to call 911 and not drive to hospital.

## 2019-03-28 NOTE — ED Provider Notes (Signed)
[name] EMERGENCY DEPARTMENT Provider Note   CSN: [id number] Arrival date & time: 03/26/19  [name]     History   Chief Complaint Chief Complaint  Patient presents with  . Cough    HPI Shannon Cummings is a 73 y.o. female.     HPI   73 year old female with chief complaint of headache.  Onset approximately 2 days ago.  Intermittent sharp pain in the left temporal region.  Lasts a few seconds.  Will get proximal-isms of pain throughout the day without appreciable exacerbating relieving factors.  No acute visual complaints. No jaw claudication. No fevers or chills but does report some generalized fatigue, general malaise nonproductive cough.  Her husband is reportedly Covid positive.  She denies any neck pain or stiffness.  No recorded fever.  Past Medical History:  Diagnosis Date  . Arthritis   . CAD (coronary artery disease)    a. prior stenting history. b. NSTEMI s/p DES to prox LAD with EF 45% by cath 08/14/12. c. Mild trop elevation shortly after cath ?mild plaque embolization - patent stent on relook. // d. Myoview 8/18: EF 60, normal perfusion; Low Risk. e. NSTEMI 06/2018- patent prior LAD stent, 80% OM1 s/p DES, normal LVEDP, EF 45-50%, moderate residual disease treated medically.  . Fatty infiltration of liver   . GERD (gastroesophageal reflux disease)   . Hyperlipidemia   . Hypertension   . Hypokalemia   . Hypotension   . Myocardial infarction ([place]) 2014  . OSA on CPAP   . Osteoporosis   . PAF (paroxysmal atrial fibrillation) ([place])    On rythmol previously  . Pulmonary nodule    a. 78m subpleural nodular density CT 08/2012, instructed to f/u pulm MD.  . Sinus bradycardia   . Tubular adenoma of colon 2007    Patient Active Problem List   Diagnosis Date Noted  . Sinus bradycardia 06/15/2018  . Ischemic cardiomyopathy 06/15/2018  . NSTEMI (non-ST elevated myocardial infarction) ([place] 06/13/2018  . GERD (gastroesophageal reflux disease) 07/10/2016  .  History of colonic polyps 02/25/2016  . Antiplatelet or antithrombotic long-term use 02/25/2016  . Nodule of right lung 10/13/2012  . History of non-ST elevation myocardial infarction (NSTEMI) 08/14/2012  . Hepatic steatosis 12/03/2011  . Paroxysmal atrial fibrillation ([place] 12/22/2010  . CAD (coronary artery disease) 12/22/2010  . INSOMNIA 04/13/2010  . Hyperlipidemia 03/19/2010  . Essential hypertension 03/19/2010  . Obstructive sleep apnea 03/17/2010    Past Surgical History:  Procedure Laterality Date  . CORONARY ANGIOPLASTY WITH STENT PLACEMENT  08/14/2012   LAD  Dr MSherren Mocha . CORONARY STENT INTERVENTION N/A 06/14/2018   Procedure: CORONARY STENT INTERVENTION;  Surgeon: VJettie Booze MD;  [facility];  Service: Cardiovascular;  Laterality: N/A;  . CORONARY STENT PLACEMENT  2000  . hair restoration  02/2017  . LEFT HEART CATH AND CORONARY ANGIOGRAPHY N/A 06/14/2018   Procedure: LEFT HEART CATH AND CORONARY ANGIOGRAPHY;  Surgeon: VJettie Booze MD;  [facility];  Service: Cardiovascular;  Laterality: N/A;  . LEFT HEART CATHETERIZATION WITH CORONARY ANGIOGRAM N/A 08/14/2012   Procedure: LEFT HEART CATHETERIZATION WITH CORONARY ANGIOGRAM;  Surgeon: MSherren Mocha MD;  [facility];  Service: Cardiovascular;  Laterality: N/A;  . LEFT HEART CATHETERIZATION WITH CORONARY ANGIOGRAM N/A 08/19/2012   Procedure: LEFT HEART CATHETERIZATION WITH CORONARY ANGIOGRAM;  Surgeon: MSherren Mocha MD;  [facility];  Service: Cardiovascular;  Laterality: N/A;  . ROTATOR CUFF REPAIR    .  TONSILLECTOMY    . TOTAL ABDOMINAL HYSTERECTOMY       OB History   No obstetric history on file.      Home Medications    Prior to Admission medications   Medication Sig Start Date End Date Taking? Authorizing Provider  acetaminophen (TYLENOL) 325 MG tablet Take 325-650 mg by mouth every 8 (eight) hours as needed (for pain).     [provider]  Alirocumab (PRALUENT) 75 MG/ML SOAJ Inject 75 mg into the skin every 14 (fourteen) days. 08/13/18   Nahser, Wonda Cheng, MD  AMBULATORY NON FORMULARY MEDICATION Take 180 mg by mouth daily. Medication Name: bempedoic acid 180 mg vs a placebo.  CLEAR Research Study, drug provided 12/21/15   Lelon Perla, MD  amiodarone (PACERONE) 200 MG tablet Take 0.5 tablets (100 mg total) by mouth daily. 08/26/18   Allred, Jeneen Rinks, MD  Cholecalciferol (VITAMIN D3) 25 MCG (1000 UT) CAPS Take 1,000 Units by mouth daily.    [provider]  clopidogrel (PLAVIX) 75 MG tablet Take 1 tablet (75 mg total) by mouth daily. 06/16/18   Dunn, Nedra Hai, PA-C  Cyanocobalamin (B-12) 1000 MCG LOZG Take 1,000 mcg by mouth 2 (two) times daily.     [provider]  ELIQUIS 5 MG TABS tablet TAKE 1 TABLET(5 MG) BY MOUTH TWICE DAILY 07/08/18   Nahser, Wonda Cheng, MD  fluticasone [facility]) 50 MCG/ACT nasal spray Place 2 sprays into both nostrils daily as needed for allergies or rhinitis.    [provider]  gabapentin (NEURONTIN) 300 MG capsule Take 1 capsule (300 mg total) by mouth 3 (three) times daily. 03/26/19   Virgel Manifold, MD  Icosapent Ethyl (VASCEPA) 1 g CAPS Take 2 capsules (2 g total) by mouth 2 (two) times daily. 08/12/18   Nahser, Wonda Cheng, MD  losartan (COZAAR) 25 MG tablet Take 2 tablets (50 mg total) by mouth daily. 06/18/18 01/23/19  Nahser, Wonda Cheng, MD  nitroGLYCERIN (NITROSTAT) 0.4 MG SL tablet Place 1 tablet (0.4 mg total) under the tongue every 5 (five) minutes as needed for chest pain (up to 3 doses). 06/18/18   Nahser, Wonda Cheng, MD  pantoprazole (PROTONIX) 40 MG tablet TAKE 1 TABLET(40 MG) BY MOUTH DAILY 01/06/19   [name], MD  potassium chloride (K-DUR) 10 MEQ tablet TAKE 1 TABLET(10 MEQ) BY MOUTH DAILY 11/08/18   Nahser, Wonda Cheng, MD  propranolol (INDERAL) 10 MG tablet Take 1 tablet (10 mg total) by mouth 4 (four) times daily as needed. 06/18/18   Nahser, Wonda Cheng, MD   spironolactone (ALDACTONE) 25 MG tablet Take 0.5 tablets (12.5 mg total) by mouth daily. 07/31/18   Nahser, Wonda Cheng, MD    Family History Family History  Problem Relation Age of Onset  . Heart disease Father   . Alzheimer's disease Mother   . Breast cancer Maternal Aunt   . Colon cancer Neg Hx     Social History Social History   Tobacco Use  . Smoking status: Never Smoker  . Smokeless tobacco: Never Used  Substance Use Topics  . Alcohol use: Yes    Comment: 2 drinks daily   . Drug use: No     Allergies   Isosorbide, Other, Effexor [venlafaxine], Statins, and Zetia [ezetimibe]   Review of Systems Review of Systems  All systems reviewed and negative, other than as noted in HPI.  Physical Exam Updated Vital Signs BP (!) 151/95   Pulse (!) 58   Temp 97.8  F (36.6 C) (Oral)   Resp 15   Ht '5\' 1"'  (1.549 m)   Wt 60.3 kg   SpO2 100%   BMI 25.13 kg/m   Physical Exam Vitals signs and nursing note reviewed.  Constitutional:      General: She is not in acute distress.    Appearance: She is well-developed.  HENT:     Head: Normocephalic and atraumatic.  Eyes:     General:        Right eye: No discharge.        Left eye: No discharge.     Conjunctiva/sclera: Conjunctivae normal.  Neck:     Musculoskeletal: Neck supple. No neck rigidity or muscular tenderness.     Comments: Patient/neck normal to inspection.  No temporal tenderness.  No concerning skin lesions noted.  Neck is supple.  No adenopathy. Cardiovascular:     Rate and Rhythm: Normal rate and regular rhythm.     Heart sounds: Normal heart sounds. No murmur. No friction rub. No gallop.   Pulmonary:     Effort: Pulmonary effort is normal. No respiratory distress.     Breath sounds: Normal breath sounds.  Abdominal:     General: There is no distension.     Palpations: Abdomen is soft.     Tenderness: There is no abdominal tenderness.  Musculoskeletal:        General: No tenderness.  Lymphadenopathy:      Cervical: No cervical adenopathy.  Skin:    General: Skin is warm and dry.  Neurological:     General: No focal deficit present.     Mental Status: She is alert and oriented to person, place, and time.     Cranial Nerves: No cranial nerve deficit.     Sensory: No sensory deficit.     Motor: No weakness.     Coordination: Coordination normal.  Psychiatric:        Behavior: Behavior normal.        Thought Content: Thought content normal.      ED Treatments / Results  Labs (all labs ordered are listed, but only abnormal results are displayed) Labs Reviewed  SEDIMENTATION RATE    EKG None  Radiology No results found.  Procedures Procedures (including critical care time)  Medications Ordered in ED Medications  gabapentin (NEURONTIN) 300 MG capsule (300 mg  Given 03/26/19 1738)     Initial Impression / Assessment and Plan / ED Course  I have reviewed the triage vital signs and the nursing notes.  Pertinent labs & imaging results that were available during my care of the patient were reviewed by me and considered in my medical decision making (see chart for details).       72 year old female with headache.  It sounds neuropathic in nature with very sharp and very brief pain lasting seconds.  Trigeminal neuralgia?  Neuro exam is nonfocal.  ESR is normal so unlikely giant cell arteritis.  No meningeal signs.  Plan trial of gabapentin.  Return precautions were discussed.  PCP follow-up.  Possible neurology follow-up if symptoms persist.  Final Clinical Impressions(s) / ED Diagnoses   Final diagnoses:  Temporal headache    ED Discharge Orders         Ordered    gabapentin (NEURONTIN) 300 MG capsule  3 times daily     03/26/19 1919           Virgel Manifold, MD 03/28/19 [phone]

## 2019-04-02 ENCOUNTER — Encounter: Payer: Medicare Other | Admitting: Family Medicine

## 2019-04-05 ENCOUNTER — Other Ambulatory Visit: Payer: Self-pay | Admitting: Cardiovascular Disease

## 2019-04-07 NOTE — Telephone Encounter (Signed)
Prescription refill request for Eliquis received.  Last office visit: Allred (08-26-2018) Scr: 0.90 (11-14-2018) Age: 73 y.o. Weight: 61.5 kg   Prescription refill sent.

## 2019-04-08 ENCOUNTER — Other Ambulatory Visit: Payer: Self-pay | Admitting: General Practice

## 2019-04-08 MED ORDER — PANTOPRAZOLE SODIUM 40 MG PO TBEC: DELAYED_RELEASE_TABLET | ORAL | 0 refills | Status: DC

## 2019-04-23 DIAGNOSIS — R350 Frequency of micturition: Secondary | ICD-10-CM | POA: Diagnosis not present

## 2019-04-23 DIAGNOSIS — R3914 Feeling of incomplete bladder emptying: Secondary | ICD-10-CM | POA: Diagnosis not present

## 2019-04-28 ENCOUNTER — Other Ambulatory Visit: Payer: Self-pay

## 2019-04-28 ENCOUNTER — Ambulatory Visit (INDEPENDENT_AMBULATORY_CARE_PROVIDER_SITE_OTHER): Payer: Medicare Other | Admitting: Family Medicine

## 2019-04-28 ENCOUNTER — Encounter: Payer: Self-pay | Admitting: Family Medicine

## 2019-04-28 VITALS — BP 110/78 | HR 57 | Temp 98.2°F | Resp 16 | Ht 60.0 in | Wt 137.4 lb

## 2019-04-28 DIAGNOSIS — Z0000 Encounter for general adult medical examination without abnormal findings: Secondary | ICD-10-CM | POA: Diagnosis not present

## 2019-04-28 DIAGNOSIS — I10 Essential (primary) hypertension: Secondary | ICD-10-CM

## 2019-04-28 DIAGNOSIS — E785 Hyperlipidemia, unspecified: Secondary | ICD-10-CM

## 2019-04-28 DIAGNOSIS — I255 Ischemic cardiomyopathy: Secondary | ICD-10-CM | POA: Diagnosis not present

## 2019-04-28 DIAGNOSIS — I214 Non-ST elevation (NSTEMI) myocardial infarction: Secondary | ICD-10-CM | POA: Diagnosis not present

## 2019-04-28 DIAGNOSIS — I480 Paroxysmal atrial fibrillation: Secondary | ICD-10-CM

## 2019-04-28 LAB — CBC WITH DIFFERENTIAL/PLATELET
Basophils Absolute: 0 10*3/uL (ref 0.0–0.1)
Basophils Relative: 0.8 % (ref 0.0–3.0)
Eosinophils Absolute: 0.1 10*3/uL (ref 0.0–0.7)
Eosinophils Relative: 1.5 % (ref 0.0–5.0)
HCT: 41.6 % (ref 36.0–46.0)
Hemoglobin: 14.1 g/dL (ref 12.0–15.0)
Lymphocytes Relative: 32.8 % (ref 12.0–46.0)
Lymphs Abs: 1.8 10*3/uL (ref 0.7–4.0)
MCHC: 33.7 g/dL (ref 30.0–36.0)
MCV: 93.9 fl (ref 78.0–100.0)
Monocytes Absolute: 0.5 10*3/uL (ref 0.1–1.0)
Monocytes Relative: 9.3 % (ref 3.0–12.0)
Neutro Abs: 3.1 10*3/uL (ref 1.4–7.7)
Neutrophils Relative %: 55.6 % (ref 43.0–77.0)
Platelets: 194 10*3/uL (ref 150.0–400.0)
RBC: 4.43 Mil/uL (ref 3.87–5.11)
RDW: 13.8 % (ref 11.5–15.5)
WBC: 5.5 10*3/uL (ref 4.0–10.5)

## 2019-04-28 LAB — BASIC METABOLIC PANEL
BUN: 14 mg/dL (ref 6–23)
CO2: 27 mEq/L (ref 19–32)
Calcium: 8.9 mg/dL (ref 8.4–10.5)
Chloride: 100 mEq/L (ref 96–112)
Creatinine, Ser: 0.9 mg/dL (ref 0.40–1.20)
GFR: 61.4 mL/min (ref >60.00)
Glucose, Bld: 84 mg/dL (ref 70–99)
Potassium: 4.1 mEq/L (ref 3.5–5.1)
Sodium: 136 mEq/L (ref 135–145)

## 2019-04-28 LAB — LIPID PANEL
Cholesterol: 204 mg/dL — ABNORMAL HIGH (ref 0–200)
HDL: 57.3 mg/dL (ref >39.00)
NonHDL: 146.6
Total CHOL/HDL Ratio: 4
Triglycerides: 253 mg/dL — ABNORMAL HIGH (ref 0.0–149.0)
VLDL: 50.6 mg/dL — ABNORMAL HIGH (ref 0.0–40.0)

## 2019-04-28 LAB — LDL CHOLESTEROL, DIRECT: Direct LDL: 111 mg/dL

## 2019-04-28 LAB — HEPATIC FUNCTION PANEL
ALT: 29 U/L (ref 0–35)
AST: 27 U/L (ref 0–37)
Albumin: 4 g/dL (ref 3.5–5.2)
Alkaline Phosphatase: 64 U/L (ref 39–117)
Bilirubin, Direct: 0.1 mg/dL (ref 0.0–0.3)
Total Bilirubin: 0.5 mg/dL (ref 0.2–1.2)
Total Protein: 6.8 g/dL (ref 6.0–8.3)

## 2019-04-28 LAB — TSH: TSH: 4.7 u[IU]/mL — ABNORMAL HIGH (ref 0.35–4.50)

## 2019-04-28 NOTE — Assessment & Plan Note (Signed)
Chronic problem.  Currently on Repatha.  Check labs.  Adjust meds prn

## 2019-04-28 NOTE — Assessment & Plan Note (Signed)
NSR.  On Eliquis twice daily and beta blocker PRN.  Currently asymptomatic.  Will follow.

## 2019-04-28 NOTE — Assessment & Plan Note (Signed)
Chronic problem.  Well controlled.  Currently asymptomatic.  Check labs.  No anticipated med changes.  Will follow. 

## 2019-04-28 NOTE — Patient Instructions (Signed)
Follow up in 6 months to recheck BP and cholesterol We'll notify you of your lab results and make any changes if needed Continue to work on healthy diet and regular exercise- you look great! Call with any questions or concerns Stay Safe!  Stay Healthy! Happy Thanksgiving!   Preventive Care 72 Years and Older, Female Preventive care refers to lifestyle choices and visits with your health care provider that can promote health and wellness. This includes:  A yearly physical exam. This is also called an annual well check.  Regular dental and eye exams.  Immunizations.  Screening for certain conditions.  Healthy lifestyle choices, such as diet and exercise. What can I expect for my preventive care visit? Physical exam Your health care provider will check:  Height and weight. These may be used to calculate body mass index (BMI), which is a measurement that tells if you are at a healthy weight.  Heart rate and blood pressure.  Your skin for abnormal spots. Counseling Your health care provider may ask you questions about:  Alcohol, tobacco, and drug use.  Emotional well-being.  Home and relationship well-being.  Sexual activity.  Eating habits.  History of falls.  Memory and ability to understand (cognition).  Work and work Statistician.  Pregnancy and menstrual history. What immunizations do I need?  Influenza (flu) vaccine  This is recommended every year. Tetanus, diphtheria, and pertussis (Tdap) vaccine  You may need a Td booster every 10 years. Varicella (chickenpox) vaccine  You may need this vaccine if you have not already been vaccinated. Zoster (shingles) vaccine  You may need this after age 72. Pneumococcal conjugate (PCV13) vaccine  One dose is recommended after age 72. Pneumococcal polysaccharide (PPSV23) vaccine  One dose is recommended after age 72. Measles, mumps, and rubella (MMR) vaccine  You may need at least one dose of MMR if you were  born in 1957 or later. You may also need a second dose. Meningococcal conjugate (MenACWY) vaccine  You may need this if you have certain conditions. Hepatitis A vaccine  You may need this if you have certain conditions or if you travel or work in places where you may be exposed to hepatitis A. Hepatitis B vaccine  You may need this if you have certain conditions or if you travel or work in places where you may be exposed to hepatitis B. Haemophilus influenzae type b (Hib) vaccine  You may need this if you have certain conditions. You may receive vaccines as individual doses or as more than one vaccine together in one shot (combination vaccines). Talk with your health care provider about the risks and benefits of combination vaccines. What tests do I need? Blood tests  Lipid and cholesterol levels. These may be checked every 5 years, or more frequently depending on your overall health.  Hepatitis C test.  Hepatitis B test. Screening  Lung cancer screening. You may have this screening every year starting at age 52 if you have a 30-pack-year history of smoking and currently smoke or have quit within the past 15 years.  Colorectal cancer screening. All adults should have this screening starting at age 54 and continuing until age 72. Your health care provider may recommend screening at age 8 if you are at increased risk. You will have tests every 1-10 years, depending on your results and the type of screening test.  Diabetes screening. This is done by checking your blood sugar (glucose) after you have not eaten for a while (fasting). You may  have this done every 1-3 years.  Mammogram. This may be done every 1-2 years. Talk with your health care provider about how often you should have regular mammograms.  BRCA-related cancer screening. This may be done if you have a family history of breast, ovarian, tubal, or peritoneal cancers. Other tests  Sexually transmitted disease (STD) testing.   Bone density scan. This is done to screen for osteoporosis. You may have this done starting at age 70. Follow these instructions at home: Eating and drinking  Eat a diet that includes fresh fruits and vegetables, whole grains, lean protein, and low-fat dairy products. Limit your intake of foods with high amounts of sugar, saturated fats, and salt.  Take vitamin and mineral supplements as recommended by your health care provider.  Do not drink alcohol if your health care provider tells you not to drink.  If you drink alcohol: ? Limit how much you have to 0-1 drink a day. ? Be aware of how much alcohol is in your drink. In the [place] one drink equals one 12 oz bottle of beer (355 mL), one 5 oz glass of wine (148 mL), or one 1 oz glass of hard liquor (44 mL). Lifestyle  Take daily care of your teeth and gums.  Stay active. Exercise for at least 30 minutes on 5 or more days each week.  Do not use any products that contain nicotine or tobacco, such as cigarettes, e-cigarettes, and chewing tobacco. If you need help quitting, ask your health care provider.  If you are sexually active, practice safe sex. Use a condom or other form of protection in order to prevent STIs (sexually transmitted infections).  Talk with your health care provider about taking a low-dose aspirin or statin. What's next?  Go to your health care provider once a year for a well check visit.  Ask your health care provider how often you should have your eyes and teeth checked.  Stay up to date on all vaccines. This information is not intended to replace advice given to you by your health care provider. Make sure you discuss any questions you have with your health care provider. Document Released: 06/18/2015 Document Revised: 05/16/2018 Document Reviewed: 05/16/2018 Elsevier Patient Education  2020 [employer].

## 2019-04-28 NOTE — Assessment & Plan Note (Signed)
Ongoing issue for pt.  Following w/ Cards.  Currently asymptomatic.  Will follow along.

## 2019-04-28 NOTE — Progress Notes (Signed)
Subjective:    Patient ID: Shannon Cummings, female    DOB: [birth year], 73 y.o.   MRN: [id number]  HPI Here today for MWV.  Risk Factors: HTN- chronic problem, on Losartan 50mg  daily, Propranolol 10mg  QID prn, Spironolactone 12.5mg  daily w/ good control.  No CP, SOB, HAs, visual changes, edema. 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  On Spironolactone, beta blocker, ARB, and Amiodarone.  Denies swelling, SOB. Hx of NSTEMI- following w/ Cards, on Plavix daily.  Plan is to stop Plavix in January.  No abnormal bleeding. PAF- chronic problem, on Eliquis 5mg  BID.  Rate controlled w/ Propranolol PRN.  Denies palpitations Hyperlipidemia- chronic problem, Praluent 75mg  q2 weeks, Vascepa 2g BID Physical Activity: no regular exercise since Gym closed Falls: low risk Depression: denies current sxs Hearing: normal to conversational tones and whispered voice ADL's: independent Cognitive: normal linear thought process, memory and attention intact Home Safety: safe at home, lives w/ husband Height, Weight, BMI, Visual Acuity: see vitals, vision corrected to 20/20 w/ glasses Counseling: UTD on mammo, immunizations.  Due for colonoscopy (unable to do based on blood thinners). Labs Ordered: See A&P Care Plan: See A&P   Patient Care Team    Relationship Specialty Notifications Start End  [name], MD PCP - General Family Medicine  07/10/16   Key, Nelia Shi, NP Nurse Practitioner Gynecology  07/10/16   Oscar La, grant  Dermatology  05/02/17    Comment: Hair restoration  Haverstock, Jennefer Bravo, MD Referring Physician Dermatology  05/02/17   Nahser, Wonda Cheng, MD Consulting Physician Cardiology  05/02/17   Bjorn Loser, MD Consulting Physician Urology  04/28/19   Ladene Artist, MD Consulting Physician Gastroenterology  04/28/19      This visit occurred during the SARS-CoV-2 public health emergency.  Safety protocols were in place, including screening questions prior to the  visit, additional usage of staff PPE, and extensive cleaning of exam room while observing appropriate contact time as indicated for disinfecting solutions.    Review of Systems Patient reports no vision/ hearing changes, adenopathy,fever, weight change,  persistant/recurrent hoarseness , swallowing issues, chest pain, palpitations, edema, persistant/recurrent cough, hemoptysis, dyspnea (rest/exertional/paroxysmal nocturnal), gastrointestinal bleeding (melena, rectal bleeding), abdominal pain, significant heartburn, bowel changes, GU symptoms (dysuria, hematuria, incontinence), Gyn symptoms (abnormal  bleeding, pain),  syncope, focal weakness, memory loss, numbness & tingling, skin/hair/nail changes, abnormal bruising or bleeding, anxiety, or depression.     Objective:   Physical Exam Vitals signs reviewed.  Constitutional:      General: She is not in acute distress.    Appearance: Normal appearance. She is well-developed.  HENT:     Head: Normocephalic and atraumatic.  Eyes:     Conjunctiva/sclera: Conjunctivae normal.     Pupils: Pupils are equal, round, and reactive to light.  Neck:     Musculoskeletal: Normal range of motion and neck supple.     Thyroid: No thyromegaly.  Cardiovascular:     Rate and Rhythm: Normal rate and regular rhythm.     Heart sounds: Normal heart sounds. No murmur.  Pulmonary:     Effort: Pulmonary effort is normal. No respiratory distress.     Breath sounds: Normal breath sounds.  Abdominal:     General: There is no distension.     Palpations: Abdomen is soft.     Tenderness: There is no abdominal tenderness.  Lymphadenopathy:     Cervical: No cervical adenopathy.  Skin:    General: Skin is warm and dry.  Neurological:  Mental Status: She is alert and oriented to person, place, and time.  Psychiatric:        Behavior: Behavior normal.           Assessment & Plan:

## 2019-04-28 NOTE — Assessment & Plan Note (Signed)
Occurred in January.  Currently on Plavix w/ plan to stop at 1 yr mark.  Currently asymptomatic.  Following w/ Cards.

## 2019-05-13 ENCOUNTER — Emergency Department (HOSPITAL_COMMUNITY): Payer: Medicare Other

## 2019-05-13 ENCOUNTER — Other Ambulatory Visit: Payer: Self-pay

## 2019-05-13 ENCOUNTER — Encounter (HOSPITAL_COMMUNITY): Payer: Self-pay | Admitting: Emergency Medicine

## 2019-05-13 ENCOUNTER — Observation Stay (HOSPITAL_COMMUNITY)
Admission: EM | Admit: 2019-05-13 | Discharge: 2019-05-14 | Disposition: A | Discharge status: Home / Self Care | Payer: Medicare Other | Attending: Cardiovascular Disease | Admitting: Cardiovascular Disease

## 2019-05-13 DIAGNOSIS — Z955 Presence of coronary angioplasty implant and graft: Secondary | ICD-10-CM | POA: Insufficient documentation

## 2019-05-13 DIAGNOSIS — I209 Angina pectoris, unspecified: Secondary | ICD-10-CM | POA: Diagnosis not present

## 2019-05-13 DIAGNOSIS — I252 Old myocardial infarction: Secondary | ICD-10-CM | POA: Diagnosis not present

## 2019-05-13 DIAGNOSIS — I214 Non-ST elevation (NSTEMI) myocardial infarction: Secondary | ICD-10-CM | POA: Diagnosis not present

## 2019-05-13 DIAGNOSIS — I10 Essential (primary) hypertension: Secondary | ICD-10-CM | POA: Diagnosis present

## 2019-05-13 DIAGNOSIS — E785 Hyperlipidemia, unspecified: Secondary | ICD-10-CM | POA: Diagnosis not present

## 2019-05-13 DIAGNOSIS — Z79899 Other long term (current) drug therapy: Secondary | ICD-10-CM | POA: Diagnosis not present

## 2019-05-13 DIAGNOSIS — I480 Paroxysmal atrial fibrillation: Secondary | ICD-10-CM | POA: Insufficient documentation

## 2019-05-13 DIAGNOSIS — I200 Unstable angina: Secondary | ICD-10-CM | POA: Diagnosis present

## 2019-05-13 DIAGNOSIS — Z7902 Long term (current) use of antithrombotics/antiplatelets: Secondary | ICD-10-CM | POA: Diagnosis not present

## 2019-05-13 DIAGNOSIS — Z7901 Long term (current) use of anticoagulants: Secondary | ICD-10-CM | POA: Diagnosis not present

## 2019-05-13 DIAGNOSIS — U071 COVID-19: Secondary | ICD-10-CM | POA: Diagnosis not present

## 2019-05-13 DIAGNOSIS — Z888 Allergy status to other drugs, medicaments and biological substances status: Secondary | ICD-10-CM | POA: Diagnosis not present

## 2019-05-13 DIAGNOSIS — G4733 Obstructive sleep apnea (adult) (pediatric): Secondary | ICD-10-CM | POA: Insufficient documentation

## 2019-05-13 DIAGNOSIS — I208 Other forms of angina pectoris: Secondary | ICD-10-CM

## 2019-05-13 DIAGNOSIS — I25110 Atherosclerotic heart disease of native coronary artery with unstable angina pectoris: Secondary | ICD-10-CM | POA: Diagnosis not present

## 2019-05-13 DIAGNOSIS — I110 Hypertensive heart disease with heart failure: Secondary | ICD-10-CM | POA: Diagnosis not present

## 2019-05-13 DIAGNOSIS — I5022 Chronic systolic (congestive) heart failure: Secondary | ICD-10-CM | POA: Diagnosis not present

## 2019-05-13 LAB — RESPIRATORY PANEL BY RT PCR (FLU A&B, COVID)
Influenza A by PCR: NEGATIVE
Influenza B by PCR: NEGATIVE
SARS Coronavirus 2 by RT PCR: POSITIVE — AB

## 2019-05-13 LAB — BASIC METABOLIC PANEL
Anion gap: 11 (ref 5–15)
BUN: 12 mg/dL (ref 8–23)
CO2: 22 mmol/L (ref 22–32)
Calcium: 8.7 mg/dL — ABNORMAL LOW (ref 8.9–10.3)
Chloride: 103 mmol/L (ref 98–111)
Creatinine, Ser: 1.14 mg/dL — ABNORMAL HIGH (ref 0.44–1.00)
GFR calc Af Amer: 56 mL/min — ABNORMAL LOW (ref >60)
GFR calc non Af Amer: 48 mL/min — ABNORMAL LOW (ref >60)
Glucose, Bld: 117 mg/dL — ABNORMAL HIGH (ref 70–99)
Potassium: 3.9 mmol/L (ref 3.5–5.1)
Sodium: 136 mmol/L (ref 135–145)

## 2019-05-13 LAB — CBC
HCT: 40.2 % (ref 36.0–46.0)
Hemoglobin: 13.7 g/dL (ref 12.0–15.0)
MCH: 31.4 pg (ref 26.0–34.0)
MCHC: 34.1 g/dL (ref 30.0–36.0)
MCV: 92.2 fL (ref 80.0–100.0)
Platelets: 200 10*3/uL (ref 150–400)
RBC: 4.36 MIL/uL (ref 3.87–5.11)
RDW: 13.2 % (ref 11.5–15.5)
WBC: 6 10*3/uL (ref 4.0–10.5)
nRBC: 0 % (ref 0.0–0.2)

## 2019-05-13 LAB — TROPONIN I (HIGH SENSITIVITY)
Troponin I (High Sensitivity): 7 ng/L (ref <18)
Troponin I (High Sensitivity): 17 ng/L (ref <18)
Troponin I (High Sensitivity): 26 ng/L — ABNORMAL HIGH (ref <18)
Troponin I (High Sensitivity): 27 ng/L — ABNORMAL HIGH (ref <18)

## 2019-05-13 LAB — HEPARIN LEVEL (UNFRACTIONATED): Heparin Unfractionated: 0.83 IU/mL — ABNORMAL HIGH (ref 0.30–0.70)

## 2019-05-13 LAB — APTT: aPTT: 44 seconds — ABNORMAL HIGH (ref 24–36)

## 2019-05-13 MED ORDER — SODIUM CHLORIDE 0.9 % IV BOLUS
1000.0000 mL | Freq: Once | INTRAVENOUS | Status: AC
  Administered 2019-05-13: 09:00:00 1000 mL via INTRAVENOUS

## 2019-05-13 MED ORDER — CLOPIDOGREL BISULFATE 75 MG PO TABS
75.0000 mg | ORAL_TABLET | Freq: Every day | ORAL | Status: DC
  Administered 2019-05-13 – 2019-05-14 (×2): 75 mg via ORAL
  Filled 2019-05-13 (×3): qty 1

## 2019-05-13 MED ORDER — NITROGLYCERIN 2 % TD OINT
1.0000 [in_us] | TOPICAL_OINTMENT | Freq: Once | TRANSDERMAL | Status: AC
  Administered 2019-05-13: 04:00:00 1 [in_us] via TOPICAL
  Filled 2019-05-13: qty 1

## 2019-05-13 MED ORDER — ACETAMINOPHEN 500 MG PO TABS
500.0000 mg | ORAL_TABLET | Freq: Once | ORAL | Status: AC
  Administered 2019-05-13: 500 mg via ORAL
  Filled 2019-05-13: qty 1

## 2019-05-13 MED ORDER — NITROGLYCERIN IN D5W 200-5 MCG/ML-% IV SOLN
0.0000 ug/min | INTRAVENOUS | Status: DC
  Administered 2019-05-13: 5 ug/min via INTRAVENOUS
  Filled 2019-05-13: qty 250

## 2019-05-13 MED ORDER — SODIUM CHLORIDE 0.9 % WEIGHT BASED INFUSION
3.0000 mL/kg/h | INTRAVENOUS | Status: AC
  Administered 2019-05-14: 04:00:00 3 mL/kg/h via INTRAVENOUS

## 2019-05-13 MED ORDER — IOHEXOL 350 MG/ML SOLN
100.0000 mL | Freq: Once | INTRAVENOUS | Status: AC | PRN
  Administered 2019-05-13: 07:00:00 60 mL via INTRAVENOUS

## 2019-05-13 MED ORDER — LOSARTAN POTASSIUM 50 MG PO TABS
50.0000 mg | ORAL_TABLET | Freq: Every day | ORAL | Status: DC
  Administered 2019-05-13 – 2019-05-14 (×2): 50 mg via ORAL
  Filled 2019-05-13 (×2): qty 1

## 2019-05-13 MED ORDER — NITROGLYCERIN 0.4 MG SL SUBL
0.4000 mg | SUBLINGUAL_TABLET | SUBLINGUAL | Status: DC | PRN
  Filled 2019-05-13: qty 1

## 2019-05-13 MED ORDER — SODIUM CHLORIDE 0.9% FLUSH: 3.0000 mL | Freq: Two times a day (BID) | INTRAVENOUS | Status: DC

## 2019-05-13 MED ORDER — ONDANSETRON HCL 4 MG/2ML IJ SOLN
4.0000 mg | Freq: Once | INTRAMUSCULAR | Status: AC
  Administered 2019-05-13: 12:00:00 4 mg via INTRAVENOUS
  Filled 2019-05-13: qty 2

## 2019-05-13 MED ORDER — ICOSAPENT ETHYL 1 G PO CAPS
2.0000 g | ORAL_CAPSULE | Freq: Two times a day (BID) | ORAL | Status: DC
  Administered 2019-05-14: 2 g via ORAL
  Filled 2019-05-13 (×4): qty 2

## 2019-05-13 MED ORDER — MORPHINE SULFATE (PF) 4 MG/ML IV SOLN
4.0000 mg | Freq: Once | INTRAVENOUS | Status: AC
  Administered 2019-05-13: 4 mg via INTRAVENOUS
  Filled 2019-05-13: qty 1

## 2019-05-13 MED ORDER — ONDANSETRON HCL 4 MG/2ML IJ SOLN
4.0000 mg | Freq: Once | INTRAMUSCULAR | Status: AC
  Administered 2019-05-13: 4 mg via INTRAVENOUS
  Filled 2019-05-13: qty 2

## 2019-05-13 MED ORDER — AMIODARONE HCL 100 MG PO TABS
100.0000 mg | ORAL_TABLET | Freq: Every day | ORAL | Status: DC
  Administered 2019-05-13 – 2019-05-14 (×2): 100 mg via ORAL
  Filled 2019-05-13 (×2): qty 1

## 2019-05-13 MED ORDER — SODIUM CHLORIDE 0.9% FLUSH: 3.0000 mL | INTRAVENOUS | Status: DC | PRN

## 2019-05-13 MED ORDER — PANTOPRAZOLE SODIUM 40 MG PO TBEC
40.0000 mg | DELAYED_RELEASE_TABLET | Freq: Every day | ORAL | Status: DC
  Administered 2019-05-14: 40 mg via ORAL
  Filled 2019-05-13: qty 1

## 2019-05-13 MED ORDER — ONDANSETRON HCL 4 MG/2ML IJ SOLN: 4.0000 mg | Freq: Four times a day (QID) | INTRAMUSCULAR | Status: DC | PRN

## 2019-05-13 MED ORDER — SODIUM CHLORIDE 0.9 % WEIGHT BASED INFUSION
1.0000 mL/kg/h | INTRAVENOUS | Status: DC
  Administered 2019-05-14: 05:00:00 1 mL/kg/h via INTRAVENOUS

## 2019-05-13 MED ORDER — ASPIRIN EC 81 MG PO TBEC: 81.0000 mg | DELAYED_RELEASE_TABLET | Freq: Every day | ORAL | Status: DC

## 2019-05-13 MED ORDER — SODIUM CHLORIDE 0.9 % IV SOLN: 250.0000 mL | INTRAVENOUS | Status: DC | PRN

## 2019-05-13 MED ORDER — ASPIRIN 81 MG PO CHEW
81.0000 mg | CHEWABLE_TABLET | ORAL | Status: AC
  Administered 2019-05-14: 06:00:00 81 mg via ORAL
  Filled 2019-05-13: qty 1

## 2019-05-13 MED ORDER — ACETAMINOPHEN 325 MG PO TABS
650.0000 mg | ORAL_TABLET | ORAL | Status: DC | PRN
  Administered 2019-05-13: 16:00:00 650 mg via ORAL
  Filled 2019-05-13: qty 2

## 2019-05-13 MED ORDER — HEPARIN (PORCINE) 25000 UT/250ML-% IV SOLN
900.0000 [IU]/h | INTRAVENOUS | Status: DC
  Administered 2019-05-13 (×2): 700 [IU]/h via INTRAVENOUS
  Filled 2019-05-13: qty 250

## 2019-05-13 MED ORDER — MORPHINE SULFATE (PF) 4 MG/ML IV SOLN
4.0000 mg | Freq: Once | INTRAVENOUS | Status: AC
  Administered 2019-05-13: 04:00:00 4 mg via INTRAVENOUS
  Filled 2019-05-13: qty 1

## 2019-05-13 MED ORDER — HEPARIN BOLUS VIA INFUSION
3000.0000 [IU] | Freq: Once | INTRAVENOUS | Status: AC
  Administered 2019-05-13: 09:00:00 3000 [IU] via INTRAVENOUS
  Filled 2019-05-13: qty 3000

## 2019-05-13 NOTE — ED Triage Notes (Signed)
Pt transported from home by EMS, pt states pain woke her from sleep, central, crushing, pt took 2 nitro at home, EMS gave 2 nitro, no improvement. ASA 324mg  given by EMS. #18 L AC.

## 2019-05-13 NOTE — ED Notes (Signed)
Per Dr. Roslynn Amble, RN to pause heparin and nitroglycerin. Repeat troponin level. Evaluate pt in a little while and plan from there.

## 2019-05-13 NOTE — ED Provider Notes (Signed)
Signout note  Summary: 73 year old lady with past medical history of coronary artery disease presents to ER with new onset chest pain at rest.  Started on nitroglycerin, heparin.  CTA chest negative for PE, cardiology consulted and they will evaluate.  Anticipate likely admission to cardiology.  7:00 AM received signout from Dr. Dina Rich with plan to follow-up cardiology recommendations  11:00 AM discussed with cards master, someone from their team will assess patient and provide recommendations  1:00PM cardiology PA filed note, I contacted her to further discuss, her attending will be down to see patient soon and will discuss further  1:58 PM attending Dr. Burt Knack with cardiology assessed patient, we reviewed her case in detail, he recommends trial of stopping heparin and nitro drip, repeat troponin.  If patient remains chest pain-free and troponin remains stable, then patient can be discharged with plan for outpatient testing.  If patient has recurrence of chest pain or Trop goes up, likely they will admit.  Updated RN on plan.  3:00 PM repeat troponin somewhat increased, updated cardiology, Dr. Burt Knack will admit to their service, will resume heparin, RN notified    Lucrezia Starch, MD 05/13/19 [phone]

## 2019-05-13 NOTE — H&P (Signed)
Cardiology Consultation:  Patient ID: Shannon Cummings  MRN: [id number]; DOB: 1945/09/24  Admit date: 05/13/2019  Date of Consult: 05/13/2019  Primary Care Provider: [name], MD  Primary Cardiologist: [name], MD  Primary Electrophysiologist: None  Patient Profile:  Shannon Cummings is a 73 y.o. female with a hx of CAD s/p DES to proximal LAD 08/14/12, NSTEMI [id number], chronic systolic heart failure with EF of 40-45%, PAF on eliquis, HTN, and HLD who is being seen today for the evaluation of chest pain at the request of Dr. Roslynn Amble.  History of Present Illness:  [name] has a history of CAD and symptomatic PAF. She was hospitalized Jan 2020 with NSTEMI. Heart cath revealed previously placed patent stent in her proximal LAD and 80% stenosis in the distal first marginal successfully treated with DES. She was placed on plavix monotherapy in the setting of eliquis. She has followed in the Afib clinic and was seen by EP 08/2018 for consideration of ablation. She reports having Afib since ?2010. She was previously treated with propafenone, but this was stopped in 06/2018 due to CAD. She was started on amiodarone as a possible bridge to ablation. She has been bradycardic with marginal pressures limiting rate control. She becomes very symptomatic with tachypalpitations when she is in Afib. Amiodarone dose was reduced to 100 mg for SOB. Dr. Rayann Heman ordered pre-procedure testing for ablation, but it does not look like these were completed. She has not been seen since her March appt. She does not tolerate CPAP for her OSA. HTN is managed with 50 mg losartan, 10 mg propranolol, and spironolactone 12.5 mg daily.  She presented to [facility] today with chest pain concerning for angina. She woke up with chest pain at approximately 1:30 AM with chest pressure that radiated into her shoulders. Pain was rated as a 8/10, associated with SOB. She states this pain feels like her prior MI's. After nitro, her CP has  been resolved.  She reports that her husband tested positive for COVID-19 one month ago and she was presumed positive, but never tested. She reports approximately one week of symptoms at that time. She has tested positive by PCR in the ER.  EKG with sinus rhythm, no signs of ischemia. HS troponin x 2 negative. CTA negative for PE. Heparin gtt, morphine, and nitropaste ordered.  Cardiology consulted for admission. Per nursing, she is now chest pain free.  Heart Pathway Score:      Past Medical History:  Diagnosis Date   Arthritis    CAD (coronary artery disease)    a. prior stenting history. b. NSTEMI s/p DES to prox LAD with EF 45% by cath 08/14/12. c. Mild trop elevation shortly after cath ?mild plaque embolization - patent stent on relook. // d. Myoview 8/18: EF 60, normal perfusion; Low Risk. e. NSTEMI 06/2018- patent prior LAD stent, 80% OM1 s/p DES, normal LVEDP, EF 45-50%, moderate residual disease treated medically.   Fatty infiltration of liver    GERD (gastroesophageal reflux disease)    Hyperlipidemia    Hypertension    Hypokalemia    Hypotension    Myocardial infarction ([place]) 2014   OSA on CPAP    Osteoporosis    PAF (paroxysmal atrial fibrillation) ([place])    On rythmol previously   Pulmonary nodule    a. 7mm subpleural nodular density CT 08/2012, instructed to f/u pulm MD.   Sinus bradycardia    Tubular adenoma of colon 2007        Past Surgical  History:  Procedure Laterality Date   CORONARY ANGIOPLASTY WITH STENT PLACEMENT  08/14/2012   LAD Dr [name]   CORONARY STENT INTERVENTION N/A 06/14/2018   Procedure: CORONARY STENT INTERVENTION; Surgeon: Jettie Booze, MD; Location: [facility]; Service: Cardiovascular; Laterality: N/A;   CORONARY STENT PLACEMENT  2000   hair restoration  02/2017   LEFT HEART CATH AND CORONARY ANGIOGRAPHY N/A 06/14/2018   Procedure: LEFT HEART CATH AND CORONARY ANGIOGRAPHY; Surgeon: Jettie Booze, MD;  Location: [facility]; Service: Cardiovascular; Laterality: N/A;   LEFT HEART CATHETERIZATION WITH CORONARY ANGIOGRAM N/A 08/14/2012   Procedure: LEFT HEART CATHETERIZATION WITH CORONARY ANGIOGRAM; Surgeon: [name], MD; Location: [facility] CATH LAB; Service: Cardiovascular; Laterality: N/A;   LEFT HEART CATHETERIZATION WITH CORONARY ANGIOGRAM N/A 08/19/2012   Procedure: LEFT HEART CATHETERIZATION WITH CORONARY ANGIOGRAM; Surgeon: [name], MD; Location: [facility] C[facility] LAB; Service: Cardiovascular; Laterality: N/A;   ROTATOR CUFF REPAIR     TONSILLECTOMY     TOTAL ABDOMINAL HYSTERECTOMY     Home Medications:         Prior to Admission medications   Medication Sig Start Date End Date Taking? Authorizing Provider  acetaminophen (TYLENOL) 650 MG CR tablet Take 650 mg by mouth every 8 (eight) hours as needed for pain.   Yes [provider]  Alirocumab (PRALUENT) 75 MG/ML SOAJ Inject 75 mg into the skin every 14 (fourteen) days. 08/13/18  Yes Nahser, Wonda Cheng, MD  amiodarone (PACERONE) 200 MG tablet Take 0.5 tablets (100 mg total) by mouth daily. 08/26/18  Yes Allred, Jeneen Rinks, MD  Cholecalciferol (VITAMIN D3) 25 MCG (1000 UT) CAPS Take 1,000 Units by mouth daily.   Yes [provider]  clopidogrel (PLAVIX) 75 MG tablet Take 1 tablet (75 mg total) by mouth daily. 06/16/18  Yes Dunn, Dayna N, PA-C  Cyanocobalamin (B-12) 1000 MCG LOZG Take 1,000 mcg by mouth daily.    Yes [provider]  ELIQUIS 5 MG TABS tablet TAKE 1 TABLET(5 MG) BY MOUTH TWICE DAILY  Patient taking differently: Take 5 mg by mouth 2 (two) times daily.  04/07/19  Yes Nahser, Wonda Cheng, MD  fluticasone [facility]) 50 MCG/ACT nasal spray Place 2 sprays into both nostrils daily as needed for allergies or rhinitis.   Yes [provider]  gabapentin (NEURONTIN) 300 MG capsule Take 1 capsule (300 mg total) by mouth 3 (three) times daily.  Patient taking differently: Take 300 mg by mouth 3 (three) times  daily as needed (for pain).  03/26/19  Yes Virgel Manifold, MD  losartan (COZAAR) 25 MG tablet Take 2 tablets (50 mg total) by mouth daily. 06/18/18 05/13/19 Yes Nahser, Wonda Cheng, MD  nitroGLYCERIN (NITROSTAT) 0.4 MG SL tablet Place 1 tablet (0.4 mg total) under the tongue every 5 (five) minutes as needed for chest pain (up to 3 doses).  Patient taking differently: Place 0.4 mg under the tongue every 5 (five) minutes x 3 doses as needed for chest pain.  06/18/18  Yes Nahser, Wonda Cheng, MD  pantoprazole (PROTONIX) 40 MG tablet TAKE 1 TABLET(40 MG) BY MOUTH DAILY  Patient taking differently: Take 40 mg by mouth daily before breakfast.  04/08/19  Yes [name], MD  potassium chloride (K-DUR) 10 MEQ tablet TAKE 1 TABLET(10 MEQ) BY MOUTH DAILY  Patient taking differently: Take 10 mEq by mouth daily.  11/08/18  Yes Nahser, Wonda Cheng, MD  propranolol (INDERAL) 10 MG tablet Take 1 tablet (10 mg total) by mouth 4 (four)  times daily as needed.  Patient taking differently: Take 10 mg by mouth 4 (four) times daily as needed ("for A-Fib attacks").  06/18/18  Yes Nahser, Wonda Cheng, MD  spironolactone (ALDACTONE) 25 MG tablet Take 0.5 tablets (12.5 mg total) by mouth daily.  Patient taking differently: Take 25 mg by mouth every other day.  07/31/18  Yes Nahser, Wonda Cheng, MD  AMBULATORY NON FORMULARY MEDICATION Take 180 mg by mouth daily. Medication Name: bempedoic acid 180 mg vs a placebo. CLEAR Research Study, drug provided 12/21/15   Lelon Perla, MD  Icosapent Ethyl (VASCEPA) 1 g CAPS Take 2 capsules (2 g total) by mouth 2 (two) times daily.  Patient not taking: Reported on 05/13/2019 08/12/18   Nahser, Wonda Cheng, MD  Inpatient Medications:  Scheduled Meds:   Continuous Infusions:   heparin 700 Units/hr (05/13/19 0836)   nitroGLYCERIN 15 mcg/min (05/13/19 1055)   PRN Meds:  Allergies:       Allergies  Allergen Reactions   Flomax [Tamsulosin] Other (See Comments)    Caused rapid HYPOTENSION- patient  came very close to fainting (also caused weakness and vertigo)   Isosorbide     Headache   Other Anxiety and Other (See Comments)    Intolerance to strong pain medications= Make her feel anxious and feel like coming out of her skin   Effexor [Venlafaxine] Other (See Comments)    CAUSED TOO MUCH SEDATION; CANNOT TOLERATE   Statins Other (See Comments)    Restless legs, bad feeling. 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 [place]: Not on file   Food insecurity    Worry: Not on file    Inability: Not on Lexicographer needs    Medical: Not on file    Non-medical: Not on file  Tobacco Use   Smoking status: Never Smoker   Smokeless tobacco: Never Used  Substance and Sexual Activity   Alcohol use: Yes    Comment: 2 drinks daily    Drug use: No   Sexual activity: Not Currently  Lifestyle   Physical activity    Days per week: Not on file    Minutes per session: Not on file   Stress: Not on file  Relationships   Social connections    Talks on phone: Not on file    Gets together: Not on file    Attends religious service: Not on file    Active member of club or organization: Not on file    Attends meetings of clubs or organizations: Not on file    Relationship status: Not on file   Intimate partner violence    Fear of current or ex partner: Not on file    Emotionally abused: Not on file    Physically abused: Not on file    Forced sexual activity: Not on file    Other Topics Concern  Not on file  Social History Narrative   Daily caffeine    Lives in [place]   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  All other ROS reviewed and negative.  Physical Exam/Data:         Vitals:   05/13/19 1030 05/13/19 1045 05/13/19 1100 05/13/19 1115  BP: 108/75 104/75 121/78 133/78  Pulse: (!) 57 (!) 58 (!) 57 (!) 53  Resp: 13 13 12 11   Temp:      TempSrc:      SpO2: 94% 93% 98% 97%  Weight:      Height:        Intake/Output Summary (Last 24 hours) at 05/13/2019 1132  Last data filed at 05/13/2019 1055     Gross per 24 hour  Intake 1007.75 ml  Output --  Net 1007.75 ml   Last 3 Weights 05/13/2019 04/28/2019 03/26/2019  Weight (lbs) 134 lb 4.2 oz 137 lb 6 oz 133 lb  Weight (kg) 60.9 kg 62.313 kg 60.328 kg   Body mass index is 25.37 kg/m.  General: Well nourished, well developed, in no acute distres  HEENT: normal  Neck: no JVD  Vascular: No carotid bruits  Cardiac: normal S1, S2; RRR; no murmur  Lungs: clear to auscultation bilaterally, no wheezing, rhonchi or rales  Abd: soft, nontender, no hepatomegaly  Ext: no edema  Musculoskeletal: No deformities, BUE and BLE strength normal and equal  Skin: warm and dry  Neuro: CNs 2-12 intact, no focal abnormalities noted  Psych: Normal affect  EKG: The EKG was personally reviewed and demonstrates: Sinus rhythm with HR 69  Telemetry: Telemetry was personally reviewed and demonstrates: Sinus rhythm - sinus bradycardia with HR in the 50-60s  Relevant CV Studies:  Echo 06/14/18:  Study Conclusions  - Left ventricle: The cavity size was normal. Systolic function was  mildly to moderately reduced. The estimated ejection fraction was  in the range of 40% to 45%.  Hypokinesis of the  basal-midanterolateral and inferolateral myocardium; consistent  with ischemia or infarction in the distribution of the left  circumflex coronary artery. Doppler parameters are consistent  with abnormal left ventricular relaxation (grade 1 diastolic  dysfunction). Doppler parameters are consistent with elevated  mean left atrial filling pressure.  - Pericardium, extracardiac: There was a left pleural effusion.  Left heart cath 06/14/18:  Previously placed Prox LAD stent is widely patent.  RPDA lesion is 50% stenosed.  Ost 1st Mrg lesion is 25% stenosed.  1st Mrg lesion is 80% stenosed.  A drug-eluting stent was successfully placed using a STENT SYNERGY DES 2.5X12.  Post intervention, there is a 0% residual stenosis.  Ost 1st Diag lesion is 25% stenosed.  The left ventricular systolic function is normal.  LV end diastolic pressure is normal. LVEDP 11 mm Hg.  The left ventricular ejection fraction is 45-50% by visual estimate.  There is no aortic valve stenosis. Continue aggressive secondary prevention. Restart Eliquis in AM. Antiplatelet recs as noted.  Pain prior to procedure, seemed out of proportion to the degree of CAD. Wall motion abnormality present. May have had some transient occlusion with vasospasm.  No IV antiplatelet agent given due to fairly recent  Eliquis dose. Brilinta given in lab due to rapid onset. Since she is on Eliquis long term, would switch to Clopidogrel as noted in the orders: 300 mg dose tomorrow and 75 mg daily after that time.  Laboratory Data:  High Sensitivity Troponin:  Last Labs                     [place]     Hematology  Last Labs                                                  BNP  Last Labs     DDimer  Last Labs     Radiology/Studies:  Ct Angio Chest Pe W And/or Wo Contrast  Result Date: 05/13/2019  CLINICAL DATA:  Complex chest pain EXAM: CT ANGIOGRAPHY CHEST WITH CONTRAST TECHNIQUE: Multidetector CT imaging of the chest was performed using the standard protocol during bolus administration of intravenous contrast. Multiplanar CT image reconstructions and MIPs were obtained to evaluate the vascular anatomy. CONTRAST: 59mL OMNIPAQUE IOHEXOL 350 MG/ML SOLN COMPARISON: 01/05/2017 FINDINGS: Cardiovascular: Normal heart size. No significant pericardial effusion. Lad coronary stenting. Essentially non-opacified aorta. No pulmonary artery filling defect Mediastinum/Nodes: Negative for adenopathy or mass Lungs/Pleura: Mild dependent atelectasis. There is no edema, consolidation, effusion, or pneumothorax. Upper Abdomen: Negative Musculoskeletal: No acute finding. Chronic Schmorl's node at the T11 superior endplate Review of the MIP images confirms the above findings. IMPRESSION: 1. Negative for pulmonary embolism. 2. Dependent atelectasis. Electronically Signed By: Monte Fantasia M.D. On: 05/13/2019 07:20  Dg Chest Portable 1 View  Result Date: 05/13/2019  CLINICAL DATA: Awoke from sleep with chest pain. EXAM: PORTABLE CHEST 1 VIEW COMPARISON: Radiograph 06/17/2018 FINDINGS: Mild chronic elevation of right hemidiaphragm.The cardiomediastinal contours are normal. The lungs are clear. Pulmonary vasculature is normal. No consolidation, pleural effusion, or pneumothorax. No acute osseous abnormalities are seen. IMPRESSION: No acute chest finding. Electronically Signed By: Keith Rake M.D. On: 05/13/2019 03:49   Assessment and Plan:  1. Chest pain  2. Known CAD with prior stents to proximal LAD and OM1  - known CAD with last stent placed 06/2018  - hs troponin x 2 negative  - EKG appears nonischemic and sinus rhythm  - continue plavix  - she has ruled out for acute MI and is now chest pain free after nitro  - given the above, will defer angiography in the setting of COVID-19 at this time  - will plan to allow her to recover  from [place] and then see her back in the office in 21 days with plans for a stress test  3. Paroxysmal atrial fibrillation  - EKG in sinus rhythm  - she has experienced chest pain in the past when in Afib  - question a bout of Afib this morning given her history of symptomatic PAF  - continue eliquis, amiodarone, and propranolol  4. Hypertension  - she was hypertensive on arrival, but was also in pain, anxious, and tearful  -  was 197/104 on arrival, now improved  - continue losartan and spironolactone  For questions or updates, please contact [place]  Please consult [email or web address] for contact info under  Signed,  Ledora Bottcher, PA  05/13/2019 11:32 AM  Patient seen, examined. Available data reviewed. Agree with findings, assessment, and plan as outlined by Doreene Adas, PA-C. The patient is independently interviewed and examined. On my exam, she is alert and oriented in no distress.      Vitals:   05/13/19 1330 05/13/19 1345  BP: 133/89 (!) 158/98  Pulse: (!) 58 (!) 56  Resp: (!) 21 13  Temp:    SpO2: 94% 99%   Pt is alert and oriented, NAD  HEENT: normal  Neck: JVP - normal, carotids 2+= without bruits  Lungs: CTA bilaterally  CV: RRR without murmur or gallop  Abd: soft, NT, Positive BS, no hepatomegaly  Ext: no C/C/E, distal pulses intact and equal  Skin: warm/dry no rash  EKG shows normal sinus rhythm with no acute changes. High-sensitivity troponin is negative x2 with serial measurements of 7 and 17 ng/L. The patient had her typical symptoms of angina this morning. She does not feel like this has been typical of atrial fibrillation for her associated symptoms in the past and in fact she feels like she has been maintaining sinus rhythm on low-dose amiodarone. She is currently chest pain-free. Her clinical presentation is complicated by the fact that she is COVID-19 positive. She actually had symptoms approximately 1 month ago that were suggestive of Covid at that time,  after her husband tested positive. She has had no recent fevers, chills, or cough. I reviewed potential treatment options with the patient. Recommend discontinuation of IV heparin and nitroglycerin. Recommend repeat high-sensitivity troponin since it has been approximately 9 hours since her last measurement. If the high-sensitivity troponin is flat and within normal limits, I would be comfortable discharging her home on medical therapy with sublingual nitroglycerin for as needed use. We would schedule her for an outpatient Lexiscan Myoview stress test. However, if her troponin continues to trend upward and/or she has recurrent chest pain, she will require hospital admission and consideration of cardiac catheterization.  [name], M.D.  05/13/2019  2:10 PM   Addendum 05/13/2019, 3:35 PM: The patient's high-sensitivity troponin came back more elevated.  The patient really has highly typical symptoms of ACS and I suspect this represents non-STEMI, possibly inflammatory mediated after her recent Covid infection.  She otherwise does not have any active Covid symptoms as outlined above and in fact is about 3 weeks out from her last infectious symptoms even though her test is positive.  Recommend resumption of IV heparin, hospital admission, and cardiac catheterization possible PCI in the morning.  We will work on obtaining an appropriate hospital bed for her considering the fact that she is COVID-19 positive and has symptoms and clinical presentation consistent with acute coronary syndrome.  Her last apixaban dose was last night and she should be okay for cardiac catheterization tomorrow morning.  She will continue on clopidogrel.  I tried to call the patient to update her but could not get through.  I did call her husband and updated him over the telephone.  [name] 05/13/2019 3:37 PM

## 2019-05-13 NOTE — H&P (Signed)
Cardiology History and Physical:   Patient ID: Shannon Cummings MRN: [id number]; DOB: 1945-11-17  Admit date: 05/13/2019 Date of Consult: 05/13/2019  Primary Care Provider: [name], MD Primary Cardiologist: [name], MD  Primary Electrophysiologist:  None    Patient Profile:   Shannon Cummings is a 73 y.o. female with a hx of  CAD s/p DES to proximal LAD 08/14/12, NSTEMI [id number], chronic systolic heart failure with EF of 40-45%, PAF on eliquis, HTN, and HLD who is being seen today for the evaluation of chest pain at the request of Dr. Roslynn Amble.  History of Present Illness:   Shannon Cummings has a history of CAD and symptomatic PAF. She was hospitalized Jan 2020 with NSTEMI. Heart cath revealed previously placed patent stent in her proximal LAD and 80% stenosis in the distal first marginal successfully treated with DES. She was placed on plavix monotherapy in the setting of eliquis. She has followed in the Afib clinic and was seen by EP 08/2018 for consideration of ablation. She reports having Afib since ?2010. She was previously treated with propafenone, but this was stopped in 06/2018 due to CAD. She was started on amiodarone as a possible bridge to ablation. She has been bradycardic with marginal pressures limiting rate control. She becomes very symptomatic with tachypalpitations when she is in Afib. Amiodarone dose was reduced to 100 mg for SOB. Dr. Rayann Heman ordered pre-procedure testing for ablation, but it does not look like these were completed. She has not been seen since her March appt.  She does not tolerate CPAP for her OSA. HTN is managed with 50 mg losartan, 10 mg propranolol, and spironolactone 12.5 mg daily.   She presented to [facility] today with chest pain concerning for angina. She woke up with chest pain at approximately 1:30 AM with chest pressure that radiated into her shoulders. Pain was rated as a 8/10, associated with SOB. She states this pain feels like her prior MI's.  After nitro, her CP has been resolved.   She reports that her husband tested positive for COVID-19 one month ago and she was presumed positive, but never tested. She reports approximately one week of symptoms at that time. She has tested positive by PCR in the ER.   EKG with sinus rhythm, no signs of ischemia. HS troponin x 2 negative. CTA negative for PE. Heparin gtt, morphine, and nitropaste ordered.   Cardiology consulted for admission. Per nursing, she is now chest pain free.    Heart Pathway Score:     Past Medical History:  Diagnosis Date   Arthritis    CAD (coronary artery disease)    a. prior stenting history. b. NSTEMI s/p DES to prox LAD with EF 45% by cath 08/14/12. c. Mild trop elevation shortly after cath ?mild plaque embolization - patent stent on relook. // d. Myoview 8/18: EF 60, normal perfusion; Low Risk. e. NSTEMI 06/2018- patent prior LAD stent, 80% OM1 s/p DES, normal LVEDP, EF 45-50%, moderate residual disease treated medically.   Fatty infiltration of liver    GERD (gastroesophageal reflux disease)    Hyperlipidemia    Hypertension    Hypokalemia    Hypotension    Myocardial infarction ([place]) 2014   OSA on CPAP    Osteoporosis    PAF (paroxysmal atrial fibrillation) ([place])    On rythmol previously   Pulmonary nodule    a. 59mm subpleural nodular density CT 08/2012, instructed to f/u pulm MD.   Sinus bradycardia    Tubular adenoma  of colon 2007    Past Surgical History:  Procedure Laterality Date   CORONARY ANGIOPLASTY WITH STENT PLACEMENT  08/14/2012   LAD  Dr [name]   CORONARY STENT INTERVENTION N/A 06/14/2018   Procedure: CORONARY STENT INTERVENTION;  Surgeon: Jettie Booze, MD;  Location: [place] [facility];  Service: Cardiovascular;  Laterality: N/A;   CORONARY STENT PLACEMENT  2000   hair restoration  02/2017   LEFT HEART CATH AND CORONARY ANGIOGRAPHY N/A 06/14/2018   Procedure: LEFT HEART CATH AND CORONARY ANGIOGRAPHY;   Surgeon: Jettie Booze, MD;  Location: [place] [facility];  Service: Cardiovascular;  Laterality: N/A;   LEFT HEART CATHETERIZATION WITH CORONARY ANGIOGRAM N/A 08/14/2012   Procedure: LEFT HEART CATHETERIZATION WITH CORONARY ANGIOGRAM;  Surgeon: [name], MD;  Location: [facility] CATH LAB;  Service: Cardiovascular;  Laterality: N/A;   LEFT HEART CATHETERIZATION WITH CORONARY ANGIOGRAM N/A 08/19/2012   Procedure: LEFT HEART CATHETERIZATION WITH CORONARY ANGIOGRAM;  Surgeon: [name], MD;  Location: [facility] CATH LAB;  Service: Cardiovascular;  Laterality: N/A;   ROTATOR CUFF REPAIR     TONSILLECTOMY     TOTAL ABDOMINAL HYSTERECTOMY       Home Medications:  Prior to Admission medications   Medication Sig Start Date End Date Taking? Authorizing Provider  acetaminophen (TYLENOL) 650 MG CR tablet Take 650 mg by mouth every 8 (eight) hours as needed for pain.   Yes [provider]  Alirocumab (PRALUENT) 75 MG/ML SOAJ Inject 75 mg into the skin every 14 (fourteen) days. 08/13/18  Yes Nahser, Wonda Cheng, MD  amiodarone (PACERONE) 200 MG tablet Take 0.5 tablets (100 mg total) by mouth daily. 08/26/18  Yes Allred, Jeneen Rinks, MD  Cholecalciferol (VITAMIN D3) 25 MCG (1000 UT) CAPS Take 1,000 Units by mouth daily.   Yes [provider]  clopidogrel (PLAVIX) 75 MG tablet Take 1 tablet (75 mg total) by mouth daily. 06/16/18  Yes Dunn, Dayna N, PA-C  Cyanocobalamin (B-12) 1000 MCG LOZG Take 1,000 mcg by mouth daily.    Yes [provider]  ELIQUIS 5 MG TABS tablet TAKE 1 TABLET(5 MG) BY MOUTH TWICE DAILY Patient taking differently: Take 5 mg by mouth 2 (two) times daily.  04/07/19  Yes Nahser, Wonda Cheng, MD  fluticasone [facility]) 50 MCG/ACT nasal spray Place 2 sprays into both nostrils daily as needed for allergies or rhinitis.   Yes [provider]  gabapentin (NEURONTIN) 300 MG capsule Take 1 capsule (300 mg total) by mouth 3 (three) times daily. Patient taking differently:  Take 300 mg by mouth 3 (three) times daily as needed (for pain).  03/26/19  Yes Virgel Manifold, MD  losartan (COZAAR) 25 MG tablet Take 2 tablets (50 mg total) by mouth daily. 06/18/18 05/13/19 Yes Nahser, Wonda Cheng, MD  nitroGLYCERIN (NITROSTAT) 0.4 MG SL tablet Place 1 tablet (0.4 mg total) under the tongue every 5 (five) minutes as needed for chest pain (up to 3 doses). Patient taking differently: Place 0.4 mg under the tongue every 5 (five) minutes x 3 doses as needed for chest pain.  06/18/18  Yes Nahser, Wonda Cheng, MD  pantoprazole (PROTONIX) 40 MG tablet TAKE 1 TABLET(40 MG) BY MOUTH DAILY Patient taking differently: Take 40 mg by mouth daily before breakfast.  04/08/19  Yes [name], MD  potassium chloride (K-DUR) 10 MEQ tablet TAKE 1 TABLET(10 MEQ) BY MOUTH DAILY Patient taking differently: Take 10 mEq by mouth daily.  11/08/18  Yes Nahser, Wonda Cheng, MD  propranolol (INDERAL) 10 MG tablet Take 1 tablet (10 mg total) by mouth 4 (four) times daily as needed. Patient taking differently: Take 10 mg by mouth 4 (four) times daily as needed ("for A-Fib attacks").  06/18/18  Yes Nahser, Wonda Cheng, MD  spironolactone (ALDACTONE) 25 MG tablet Take 0.5 tablets (12.5 mg total) by mouth daily. Patient taking differently: Take 25 mg by mouth every other day.  07/31/18  Yes Nahser, Wonda Cheng, MD  AMBULATORY NON FORMULARY MEDICATION Take 180 mg by mouth daily. Medication Name: bempedoic acid 180 mg vs a placebo.  CLEAR Research Study, drug provided 12/21/15   Lelon Perla, MD  Icosapent Ethyl (VASCEPA) 1 g CAPS Take 2 capsules (2 g total) by mouth 2 (two) times daily. Patient not taking: Reported on 05/13/2019 08/12/18   Nahser, Wonda Cheng, MD    Inpatient Medications: Scheduled Meds:  Continuous Infusions:  heparin Stopped (05/13/19 1358)   nitroGLYCERIN Stopped (05/13/19 1359)   PRN Meds:   Allergies:    Allergies  Allergen Reactions   Flomax [Tamsulosin] Other (See Comments)    Caused  rapid HYPOTENSION- patient came very close to fainting (also caused weakness and vertigo)   Isosorbide     Headache   Other Anxiety and Other (See Comments)    Intolerance to strong pain medications=  Make her feel anxious and feel like coming out of her skin   Effexor [Venlafaxine] Other (See Comments)    CAUSED TOO MUCH SEDATION; CANNOT TOLERATE   Statins Other (See Comments)    Restless legs, bad feeling.  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 [place]: Not on file   Food insecurity    Worry: Not on file    Inability: Not on Lexicographer needs    Medical: Not on file    Non-medical: Not on file  Tobacco Use   Smoking status: Never Smoker   Smokeless tobacco: Never Used  Substance and Sexual Activity   Alcohol use: Yes    Comment: 2 drinks daily    Drug use: No   Sexual activity: Not Currently  Lifestyle   Physical activity    Days per week: Not on file    Minutes per session: Not on file   Stress: Not on file  Relationships   Social connections    Talks on phone: Not on file    Gets together: Not on file    Attends religious service: Not on file    Active member of club or organization: Not on file    Attends meetings of clubs or organizations: Not on file    Relationship status: Not on file   Intimate partner violence    Fear of current or ex partner: Not on file    Emotionally abused: Not on file    Physically abused: Not on file    Forced sexual  activity: Not  on file  Other Topics Concern   Not on file  Social History Narrative   Daily caffeine    Lives in [place]   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   All other ROS reviewed and negative.     Physical Exam/Data:   Vitals:   05/13/19 1430 05/13/19 1445 05/13/19 1500 05/13/19 1515  BP: (!) 176/96 (!) 151/90 (!) 158/84 (!) 175/98  Pulse: (!) 51 (!) 54 (!) 51 (!) 58  Resp: (!) 9 12 12 13   Temp:      TempSrc:      SpO2: 96% 96% 97% 99%  Weight:      Height:        Intake/Output Summary (Last 24 hours) at 05/13/2019 1545 Last data filed at 05/13/2019 1055 Gross per 24 hour  Intake 1007.75 ml  Output --  Net 1007.75 ml   Last 3 Weights 05/13/2019 04/28/2019 03/26/2019  Weight (lbs) 134 lb 4.2 oz 137 lb 6 oz 133 lb  Weight (kg) 60.9 kg 62.313 kg 60.328 kg     Body mass index is 25.37 kg/m.  General:  Well nourished, well developed, in no acute distres HEENT: normal Neck: no JVD Vascular: No carotid bruits Cardiac:  normal S1, S2; RRR; no murmur  Lungs:  clear to auscultation bilaterally, no wheezing, rhonchi or rales  Abd: soft, nontender, no hepatomegaly  Ext: no edema Musculoskeletal:  No deformities, BUE and BLE strength normal and equal Skin: warm and dry  Neuro:  CNs 2-12 intact, no focal abnormalities noted Psych:  Normal affect   EKG:  The EKG was personally reviewed and demonstrates:  Sinus rhythm with HR 69 Telemetry:  Telemetry was personally reviewed and demonstrates:  Sinus rhythm - sinus bradycardia with HR in the 50-60s  Relevant CV Studies:  Echo 06/14/18: Study Conclusions - Left ventricle: The cavity size was normal. Systolic function was   mildly to moderately reduced. The estimated ejection  fraction was   in the range of 40% to 45%. Hypokinesis of the   basal-midanterolateral and inferolateral myocardium; consistent   with ischemia or infarction in the distribution of the left   circumflex coronary artery. Doppler parameters are consistent   with abnormal left ventricular relaxation (grade 1 diastolic   dysfunction). Doppler parameters are consistent with elevated   mean left atrial filling pressure. - Pericardium, extracardiac: There was a left pleural effusion.   Left heart cath 06/14/18:  Previously placed Prox LAD stent is widely patent.  RPDA lesion is 50% stenosed.  Ost 1st Mrg lesion is 25% stenosed.  1st Mrg lesion is 80% stenosed.  A drug-eluting stent was successfully placed using a STENT SYNERGY DES 2.5X12.  Post intervention, there is a 0% residual stenosis.  Ost 1st Diag lesion is 25% stenosed.  The left ventricular systolic function is normal.  LV end diastolic pressure is normal. LVEDP 11 mm Hg.  The left ventricular ejection fraction is 45-50% by visual estimate.  There is no aortic valve stenosis.   Continue aggressive secondary prevention.  Restart Eliquis in AM.  Antiplatelet recs as noted.    Pain prior to procedure, seemed out of proportion to the degree of  CAD.  Wall motion abnormality present.  May have had some transient occlusion with vasospasm.   No IV antiplatelet agent given due to fairly recent Eliquis dose.  Brilinta given in lab due to rapid onset.  Since she is on Eliquis long term, would switch to Clopidogrel as noted in the orders: 300 mg dose tomorrow and 75 mg daily after that time.    Laboratory Data:  High Sensitivity Troponin:   Recent Labs  Lab 05/13/19 0330 05/13/19 0530 05/13/19 1359  TROPONINIHS 7 17 26*     Chemistry Recent Labs  Lab 05/13/19 0330  NA 136  K 3.9  CL 103  CO2 22  GLUCOSE 117*  BUN 12  CREATININE 1.14*  CALCIUM 8.7*  GFRNONAA 48*  GFRAA 56*  ANIONGAP 11    No results for  input(s): PROT, ALBUMIN, AST, ALT, ALKPHOS, BILITOT in the last 168 hours. Hematology Recent Labs  Lab 05/13/19 0330  WBC 6.0  RBC 4.36  HGB 13.7  HCT 40.2  MCV 92.2  MCH 31.4  MCHC 34.1  RDW 13.2  PLT 200   BNPNo results for input(s): BNP, PROBNP in the last 168 hours.  DDimer No results for input(s): DDIMER in the last 168 hours.   Radiology/Studies:  Ct Angio Chest Pe W And/or Wo Contrast  Result Date: 05/13/2019 CLINICAL DATA:  Complex chest pain EXAM: CT ANGIOGRAPHY CHEST WITH CONTRAST TECHNIQUE: Multidetector CT imaging of the chest was performed using the standard protocol during bolus administration of intravenous contrast. Multiplanar CT image reconstructions and MIPs were obtained to evaluate the vascular anatomy. CONTRAST:  49mL OMNIPAQUE IOHEXOL 350 MG/ML SOLN COMPARISON:  01/05/2017 FINDINGS: Cardiovascular: Normal heart size. No significant pericardial effusion. Lad coronary stenting. Essentially non-opacified aorta. No pulmonary artery filling defect Mediastinum/Nodes: Negative for adenopathy or mass Lungs/Pleura: Mild dependent atelectasis. There is no edema, consolidation, effusion, or pneumothorax. Upper Abdomen: Negative Musculoskeletal: No acute finding. Chronic Schmorl's node at the T11 superior endplate Review of the MIP images confirms the above findings. IMPRESSION: 1. Negative for pulmonary embolism. 2. Dependent atelectasis. Electronically Signed   By: Monte Fantasia M.D.   On: 05/13/2019 07:20   Dg Chest Portable 1 View  Result Date: 05/13/2019 CLINICAL DATA:  Awoke from sleep with chest pain. EXAM: PORTABLE CHEST 1 VIEW COMPARISON:  Radiograph 06/17/2018 FINDINGS: Mild chronic elevation of right hemidiaphragm.The cardiomediastinal contours are normal. The lungs are clear. Pulmonary vasculature is normal. No consolidation, pleural effusion, or pneumothorax. No acute osseous abnormalities are seen. IMPRESSION: No acute chest finding. Electronically Signed   By:  Keith Rake M.D.   On: 05/13/2019 03:49    Assessment and Plan:   1. Chest pain 2. Known CAD with prior stents to proximal LAD and OM1 - known CAD with last stent placed 06/2018 - hs troponin 7 --> 17 --> 26 - EKG appears nonischemic and sinus rhythm - continue plavix - chest pain is responsive to nitro  - will admit to cardiology service with plans for heart cath tomorrow morning - admit to negative pressure room - heparin drip - PRN nitro for chest pain - NPO at MN   3. Paroxysmal atrial fibrillation - EKG in sinus rhythm - she has experienced chest pain in the past when in Afib - question a bout of Afib this morning given her history of symptomatic PAF - continue amiodarone - hold eliquis, heparin as above   4. Hypertension - she was hypertensive on arrival, but was also in pain, anxious, and  tearful - was 197/104 on arrival, now improved  - continue losartan and spironolactone       For questions or updates, please contact [place] Please consult [email or web address] for contact info under     Signed, Ledora Bottcher, PA  05/13/2019 3:45 PM  Patient seen, examined. Available data reviewed. Agree with findings, assessment, and plan as outlined by Doreene Adas, PA-C.  The patient is independently interviewed and examined.  On my exam, she is alert and oriented in no distress. Vitals:   05/13/19 1500 05/13/19 1515  BP: (!) 158/84 (!) 175/98  Pulse: (!) 51 (!) 58  Resp: 12 13  Temp:    SpO2: 97% 99%   Pt is alert and oriented, NAD HEENT: normal Neck: JVP - normal, carotids 2+= without bruits Lungs: CTA bilaterally CV: RRR without murmur or gallop Abd: soft, NT, Positive BS, no hepatomegaly Ext: no C/C/E, distal pulses intact and equal Skin: warm/dry no rash  EKG shows normal sinus rhythm with no acute changes.  High-sensitivity troponin is negative x2 with serial measurements of 7 and 17 ng/L.  The patient had her typical symptoms of angina this  morning.  She does not feel like this has been typical of atrial fibrillation for her associated symptoms in the past and in fact she feels like she has been maintaining sinus rhythm on low-dose amiodarone.  She is currently chest pain-free.  Her clinical presentation is complicated by the fact that she is COVID-19 positive.  She actually had symptoms approximately 1 month ago that were suggestive of Covid at that time, after her husband tested positive.  She has had no recent fevers, chills, or cough.  I reviewed potential treatment options with the patient.  Recommend discontinuation of IV heparin and nitroglycerin.  Recommend repeat high-sensitivity troponin since it has been approximately 9 hours since her last measurement.  If the high-sensitivity troponin is flat and within normal limits, I would be comfortable discharging her home on medical therapy with sublingual nitroglycerin for as needed use.  We would schedule her for an outpatient Lexiscan Myoview stress test.  However, if her troponin continues to trend upward and/or she has recurrent chest pain, she will require hospital admission and consideration of cardiac catheterization.  [name], M.D. 05/13/2019 3:45 PM

## 2019-05-13 NOTE — Progress Notes (Signed)
ANTICOAGULATION CONSULT NOTE - Initial Consult  Pharmacy Consult for Heparin Indication: chest pain/ACS  Allergies  Allergen Reactions  . Flomax [Tamsulosin] Other (See Comments)    Caused rapid HYPOTENSION- patient came very close to fainting (also caused weakness and vertigo)  . Isosorbide     Headache  . Other Anxiety and Other (See Comments)    Intolerance to strong pain medications=  Make her feel anxious and feel like coming out of her skin  . Effexor [Venlafaxine] Other (See Comments)    CAUSED TOO MUCH SEDATION; CANNOT TOLERATE  . Statins Other (See Comments)    Restless legs, bad feeling.  This has occurred with Crestor 5 mg once weekly, Lipitor 10 mg twice weekly, and Simvastatin daily  . Zetia [Ezetimibe] Other (See Comments)    Made the legs ache    Patient Measurements: Height: 5\' 1"  (154.9 cm) Weight: 134 lb 4.2 oz (60.9 kg) IBW/kg (Calculated) : 47.8  Vital Signs: Temp: 97.8 F (36.6 C) (12/08 0344) Temp Source: Oral (12/08 0344) BP: 108/85 (12/08 0645) Pulse Rate: 63 (12/08 0645)  Labs: Recent Labs    05/13/19 0330 05/13/19 0530  HGB 13.7  --   HCT 40.2  --   PLT 200  --   CREATININE 1.14*  --   TROPONINIHS 7 17    Estimated Creatinine Clearance: 37.3 mL/min (A) (by C-G formula based on SCr of 1.14 mg/dL (H)).   Medical History: Past Medical History:  Diagnosis Date  . Arthritis   . CAD (coronary artery disease)    a. prior stenting history. b. NSTEMI s/p DES to prox LAD with EF 45% by cath 08/14/12. c. Mild trop elevation shortly after cath ?mild plaque embolization - patent stent on relook. // d. Myoview 8/18: EF 60, normal perfusion; Low Risk. e. NSTEMI 06/2018- patent prior LAD stent, 80% OM1 s/p DES, normal LVEDP, EF 45-50%, moderate residual disease treated medically.  . Fatty infiltration of liver   . GERD (gastroesophageal reflux disease)   . Hyperlipidemia   . Hypertension   . Hypokalemia   . Hypotension   . Myocardial infarction  ([place]) 2014  . OSA on CPAP   . Osteoporosis   . PAF (paroxysmal atrial fibrillation) ([place])    On rythmol previously  . Pulmonary nodule    a. 98mm subpleural nodular density CT 08/2012, instructed to f/u pulm MD.  . Sinus bradycardia   . Tubular adenoma of colon 2007    Medications:  No current facility-administered medications on file prior to encounter.    Current Outpatient Medications on File Prior to Encounter  Medication Sig Dispense Refill  . acetaminophen (TYLENOL) 650 MG CR tablet Take 650 mg by mouth every 8 (eight) hours as needed for pain.    . Alirocumab (PRALUENT) 75 MG/ML SOAJ Inject 75 mg into the skin every 14 (fourteen) days. 2 pen 11  . amiodarone (PACERONE) 200 MG tablet Take 0.5 tablets (100 mg total) by mouth daily. 45 tablet 3  . Cholecalciferol (VITAMIN D3) 25 MCG (1000 UT) CAPS Take 1,000 Units by mouth daily.    . clopidogrel (PLAVIX) 75 MG tablet Take 1 tablet (75 mg total) by mouth daily. 30 tablet 11  . Cyanocobalamin (B-12) 1000 MCG LOZG Take 1,000 mcg by mouth daily.     Marland Kitchen ELIQUIS 5 MG TABS tablet TAKE 1 TABLET(5 MG) BY MOUTH TWICE DAILY (Patient taking differently: Take 5 mg by mouth 2 (two) times daily. ) 180 tablet 1  . fluticasone (FLONASE)  50 MCG/ACT nasal spray Place 2 sprays into both nostrils daily as needed for allergies or rhinitis.    Marland Kitchen gabapentin (NEURONTIN) 300 MG capsule Take 1 capsule (300 mg total) by mouth 3 (three) times daily. (Patient taking differently: Take 300 mg by mouth 3 (three) times daily as needed (for pain). ) 21 capsule 0  . losartan (COZAAR) 25 MG tablet Take 2 tablets (50 mg total) by mouth daily. 60 tablet 11  . nitroGLYCERIN (NITROSTAT) 0.4 MG SL tablet Place 1 tablet (0.4 mg total) under the tongue every 5 (five) minutes as needed for chest pain (up to 3 doses). (Patient taking differently: Place 0.4 mg under the tongue every 5 (five) minutes x 3 doses as needed for chest pain. ) 25 tablet 6  . pantoprazole (PROTONIX) 40 MG  tablet TAKE 1 TABLET(40 MG) BY MOUTH DAILY (Patient taking differently: Take 40 mg by mouth daily before breakfast. ) 90 tablet 0  . potassium chloride (K-DUR) 10 MEQ tablet TAKE 1 TABLET(10 MEQ) BY MOUTH DAILY (Patient taking differently: Take 10 mEq by mouth daily. ) 90 tablet 2  . propranolol (INDERAL) 10 MG tablet Take 1 tablet (10 mg total) by mouth 4 (four) times daily as needed. (Patient taking differently: Take 10 mg by mouth 4 (four) times daily as needed ("for A-Fib attacks"). ) 60 tablet 11  . spironolactone (ALDACTONE) 25 MG tablet Take 0.5 tablets (12.5 mg total) by mouth daily. (Patient taking differently: Take 25 mg by mouth every other day. ) 45 tablet 3  . AMBULATORY NON FORMULARY MEDICATION Take 180 mg by mouth daily. Medication Name: bempedoic acid 180 mg vs a placebo.  CLEAR Research Study, drug provided    . Icosapent Ethyl (VASCEPA) 1 g CAPS Take 2 capsules (2 g total) by mouth 2 (two) times daily. (Patient not taking: Reported on 05/13/2019) 120 capsule 11  . [DISCONTINUED] acetaminophen (TYLENOL) 325 MG tablet Take 325-650 mg by mouth every 8 (eight) hours as needed (for pain).     . [DISCONTINUED] tamsulosin (FLOMAX) 0.4 MG CAPS capsule Take 0.4 mg by mouth daily.       Assessment: 73 y.o. female with chest pain for heparin  Goal of Therapy:  Heparin level 0.3-0.7 units/ml Monitor platelets by anticoagulation protocol: Yes   Plan:  Heparin 3000 units IV bolus, then start heparin 700 units/hr Check heparin level in 8 hours.   Deyra Perdomo, Bronson Curb 05/13/2019,6:58 AM

## 2019-05-13 NOTE — ED Notes (Signed)
Pt placed on 2L 02 for comfort.

## 2019-05-13 NOTE — Consult Note (Addendum)
Cardiology Consultation:   Patient ID: Shannon Cummings MRN: [id number]; DOB: 08/05/1945  Admit date: 05/13/2019 Date of Consult: 05/13/2019  Primary Care Provider: [name], MD Primary Cardiologist: [name], MD  Primary Electrophysiologist:  None    Patient Profile:   Shannon Cummings is a 73 y.o. female with a hx of  CAD s/p DES to proximal LAD 08/14/12, NSTEMI [id number], chronic systolic heart failure with EF of 40-45%, PAF on eliquis, HTN, and HLD who is being seen today for the evaluation of chest pain at the request of Dr. Roslynn Amble.  History of Present Illness:   Ms. Roda has a history of CAD and symptomatic PAF. She was hospitalized Jan 2020 with NSTEMI. Heart cath revealed previously placed patent stent in her proximal LAD and 80% stenosis in the distal first marginal successfully treated with DES. She was placed on plavix monotherapy in the setting of eliquis. She has followed in the Afib clinic and was seen by EP 08/2018 for consideration of ablation. She reports having Afib since ?2010. She was previously treated with propafenone, but this was stopped in 06/2018 due to CAD. She was started on amiodarone as a possible bridge to ablation. She has been bradycardic with marginal pressures limiting rate control. She becomes very symptomatic with tachypalpitations when she is in Afib. Amiodarone dose was reduced to 100 mg for SOB. Dr. Rayann Heman ordered pre-procedure testing for ablation, but it does not look like these were completed. She has not been seen since her March appt.  She does not tolerate CPAP for her OSA. HTN is managed with 50 mg losartan, 10 mg propranolol, and spironolactone 12.5 mg daily.   She presented to [facility] today with chest pain concerning for angina. She woke up with chest pain at approximately 1:30 AM with chest pressure that radiated into her shoulders. Pain was rated as a 8/10, associated with SOB. She states this pain feels like her prior MI's. After nitro,  her CP has been resolved.   She reports that her husband tested positive for COVID-19 one month ago and she was presumed positive, but never tested. She reports approximately one week of symptoms at that time. She has tested positive by PCR in the ER.   EKG with sinus rhythm, no signs of ischemia. HS troponin x 2 negative. CTA negative for PE. Heparin gtt, morphine, and nitropaste ordered.   Cardiology consulted for admission. Per nursing, she is now chest pain free.    Heart Pathway Score:     Past Medical History:  Diagnosis Date   Arthritis    CAD (coronary artery disease)    a. prior stenting history. b. NSTEMI s/p DES to prox LAD with EF 45% by cath 08/14/12. c. Mild trop elevation shortly after cath ?mild plaque embolization - patent stent on relook. // d. Myoview 8/18: EF 60, normal perfusion; Low Risk. e. NSTEMI 06/2018- patent prior LAD stent, 80% OM1 s/p DES, normal LVEDP, EF 45-50%, moderate residual disease treated medically.   Fatty infiltration of liver    GERD (gastroesophageal reflux disease)    Hyperlipidemia    Hypertension    Hypokalemia    Hypotension    Myocardial infarction ([place]) 2014   OSA on CPAP    Osteoporosis    PAF (paroxysmal atrial fibrillation) ([place])    On rythmol previously   Pulmonary nodule    a. 30mm subpleural nodular density CT 08/2012, instructed to f/u pulm MD.   Sinus bradycardia    Tubular adenoma of colon  2007    Past Surgical History:  Procedure Laterality Date   CORONARY ANGIOPLASTY WITH STENT PLACEMENT  08/14/2012   LAD  Dr [name]   CORONARY STENT INTERVENTION N/A 06/14/2018   Procedure: CORONARY STENT INTERVENTION;  Surgeon: Jettie Booze, MD;  Location: [place] [facility];  Service: Cardiovascular;  Laterality: N/A;   CORONARY STENT PLACEMENT  2000   hair restoration  02/2017   LEFT HEART CATH AND CORONARY ANGIOGRAPHY N/A 06/14/2018   Procedure: LEFT HEART CATH AND CORONARY ANGIOGRAPHY;  Surgeon:  Jettie Booze, MD;  Location: [place] [facility];  Service: Cardiovascular;  Laterality: N/A;   LEFT HEART CATHETERIZATION WITH CORONARY ANGIOGRAM N/A 08/14/2012   Procedure: LEFT HEART CATHETERIZATION WITH CORONARY ANGIOGRAM;  Surgeon: [name], MD;  Location: [facility] CATH LAB;  Service: Cardiovascular;  Laterality: N/A;   LEFT HEART CATHETERIZATION WITH CORONARY ANGIOGRAM N/A 08/19/2012   Procedure: LEFT HEART CATHETERIZATION WITH CORONARY ANGIOGRAM;  Surgeon: [name], MD;  Location: [facility] CATH LAB;  Service: Cardiovascular;  Laterality: N/A;   ROTATOR CUFF REPAIR     TONSILLECTOMY     TOTAL ABDOMINAL HYSTERECTOMY       Home Medications:  Prior to Admission medications   Medication Sig Start Date End Date Taking? Authorizing Provider  acetaminophen (TYLENOL) 650 MG CR tablet Take 650 mg by mouth every 8 (eight) hours as needed for pain.   Yes [provider]  Alirocumab (PRALUENT) 75 MG/ML SOAJ Inject 75 mg into the skin every 14 (fourteen) days. 08/13/18  Yes Nahser, Wonda Cheng, MD  amiodarone (PACERONE) 200 MG tablet Take 0.5 tablets (100 mg total) by mouth daily. 08/26/18  Yes Allred, Jeneen Rinks, MD  Cholecalciferol (VITAMIN D3) 25 MCG (1000 UT) CAPS Take 1,000 Units by mouth daily.   Yes [provider]  clopidogrel (PLAVIX) 75 MG tablet Take 1 tablet (75 mg total) by mouth daily. 06/16/18  Yes Dunn, Dayna N, PA-C  Cyanocobalamin (B-12) 1000 MCG LOZG Take 1,000 mcg by mouth daily.    Yes [provider]  ELIQUIS 5 MG TABS tablet TAKE 1 TABLET(5 MG) BY MOUTH TWICE DAILY Patient taking differently: Take 5 mg by mouth 2 (two) times daily.  04/07/19  Yes Nahser, Wonda Cheng, MD  fluticasone [facility]) 50 MCG/ACT nasal spray Place 2 sprays into both nostrils daily as needed for allergies or rhinitis.   Yes [provider]  gabapentin (NEURONTIN) 300 MG capsule Take 1 capsule (300 mg total) by mouth 3 (three) times daily. Patient taking differently: Take 300  mg by mouth 3 (three) times daily as needed (for pain).  03/26/19  Yes Virgel Manifold, MD  losartan (COZAAR) 25 MG tablet Take 2 tablets (50 mg total) by mouth daily. 06/18/18 05/13/19 Yes Nahser, Wonda Cheng, MD  nitroGLYCERIN (NITROSTAT) 0.4 MG SL tablet Place 1 tablet (0.4 mg total) under the tongue every 5 (five) minutes as needed for chest pain (up to 3 doses). Patient taking differently: Place 0.4 mg under the tongue every 5 (five) minutes x 3 doses as needed for chest pain.  06/18/18  Yes Nahser, Wonda Cheng, MD  pantoprazole (PROTONIX) 40 MG tablet TAKE 1 TABLET(40 MG) BY MOUTH DAILY Patient taking differently: Take 40 mg by mouth daily before breakfast.  04/08/19  Yes [name], MD  potassium chloride (K-DUR) 10 MEQ tablet TAKE 1 TABLET(10 MEQ) BY MOUTH DAILY Patient taking differently: Take 10 mEq by mouth daily.  11/08/18  Yes Nahser, Wonda Cheng, MD  propranolol Alphia Moh)  10 MG tablet Take 1 tablet (10 mg total) by mouth 4 (four) times daily as needed. Patient taking differently: Take 10 mg by mouth 4 (four) times daily as needed ("for A-Fib attacks").  06/18/18  Yes Nahser, Wonda Cheng, MD  spironolactone (ALDACTONE) 25 MG tablet Take 0.5 tablets (12.5 mg total) by mouth daily. Patient taking differently: Take 25 mg by mouth every other day.  07/31/18  Yes Nahser, Wonda Cheng, MD  AMBULATORY NON FORMULARY MEDICATION Take 180 mg by mouth daily. Medication Name: bempedoic acid 180 mg vs a placebo.  CLEAR Research Study, drug provided 12/21/15   Lelon Perla, MD  Icosapent Ethyl (VASCEPA) 1 g CAPS Take 2 capsules (2 g total) by mouth 2 (two) times daily. Patient not taking: Reported on 05/13/2019 08/12/18   Nahser, Wonda Cheng, MD    Inpatient Medications: Scheduled Meds:  Continuous Infusions:  heparin 700 Units/hr (05/13/19 0836)   nitroGLYCERIN 15 mcg/min (05/13/19 1055)   PRN Meds:   Allergies:    Allergies  Allergen Reactions   Flomax [Tamsulosin] Other (See Comments)    Caused  rapid HYPOTENSION- patient came very close to fainting (also caused weakness and vertigo)   Isosorbide     Headache   Other Anxiety and Other (See Comments)    Intolerance to strong pain medications=  Make her feel anxious and feel like coming out of her skin   Effexor [Venlafaxine] Other (See Comments)    CAUSED TOO MUCH SEDATION; CANNOT TOLERATE   Statins Other (See Comments)    Restless legs, bad feeling.  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 [place]: Not on file   Food insecurity    Worry: Not on file    Inability: Not on Lexicographer needs    Medical: Not on file    Non-medical: Not on file  Tobacco Use   Smoking status: Never Smoker   Smokeless tobacco: Never Used  Substance and Sexual Activity   Alcohol use: Yes    Comment: 2 drinks daily    Drug use: No   Sexual activity: Not Currently  Lifestyle   Physical activity    Days per week: Not on file    Minutes per session: Not on file   Stress: Not on file  Relationships   Social connections    Talks on phone: Not on file    Gets together: Not on file    Attends religious service: Not on file    Active member of club or organization: Not on file    Attends meetings of clubs or organizations: Not on file    Relationship status: Not on file   Intimate partner violence    Fear of current or ex partner: Not on file    Emotionally abused: Not on file    Physically abused: Not on file    Forced sexual  activity: Not  on file  Other Topics Concern   Not on file  Social History Narrative   Daily caffeine    Lives in [place]   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   All other ROS reviewed and negative.     Physical Exam/Data:   Vitals:   05/13/19 1030 05/13/19 1045 05/13/19 1100 05/13/19 1115  BP: 108/75 104/75 121/78 133/78  Pulse: (!) 57 (!) 58 (!) 57 (!) 53  Resp: 13 13 12 11   Temp:      TempSrc:      SpO2: 94% 93% 98% 97%  Weight:      Height:        Intake/Output Summary (Last 24 hours) at 05/13/2019 1132 Last data filed at 05/13/2019 1055 Gross per 24 hour  Intake 1007.75 ml  Output --  Net 1007.75 ml   Last 3 Weights 05/13/2019 04/28/2019 03/26/2019  Weight (lbs) 134 lb 4.2 oz 137 lb 6 oz 133 lb  Weight (kg) 60.9 kg 62.313 kg 60.328 kg     Body mass index is 25.37 kg/m.  General:  Well nourished, well developed, in no acute distres HEENT: normal Neck: no JVD Vascular: No carotid bruits Cardiac:  normal S1, S2; RRR; no murmur  Lungs:  clear to auscultation bilaterally, no wheezing, rhonchi or rales  Abd: soft, nontender, no hepatomegaly  Ext: no edema Musculoskeletal:  No deformities, BUE and BLE strength normal and equal Skin: warm and dry  Neuro:  CNs 2-12 intact, no focal abnormalities noted Psych:  Normal affect   EKG:  The EKG was personally reviewed and demonstrates:  Sinus rhythm with HR 69 Telemetry:  Telemetry was personally reviewed and demonstrates:  Sinus rhythm - sinus bradycardia with HR in the 50-60s  Relevant CV Studies:  Echo 06/14/18: Study Conclusions - Left ventricle: The cavity size was normal. Systolic function was   mildly to moderately reduced. The estimated ejection fraction was   in  the range of 40% to 45%. Hypokinesis of the   basal-midanterolateral and inferolateral myocardium; consistent   with ischemia or infarction in the distribution of the left   circumflex coronary artery. Doppler parameters are consistent   with abnormal left ventricular relaxation (grade 1 diastolic   dysfunction). Doppler parameters are consistent with elevated   mean left atrial filling pressure. - Pericardium, extracardiac: There was a left pleural effusion.   Left heart cath 06/14/18:  Previously placed Prox LAD stent is widely patent.  RPDA lesion is 50% stenosed.  Ost 1st Mrg lesion is 25% stenosed.  1st Mrg lesion is 80% stenosed.  A drug-eluting stent was successfully placed using a STENT SYNERGY DES 2.5X12.  Post intervention, there is a 0% residual stenosis.  Ost 1st Diag lesion is 25% stenosed.  The left ventricular systolic function is normal.  LV end diastolic pressure is normal. LVEDP 11 mm Hg.  The left ventricular ejection fraction is 45-50% by visual estimate.  There is no aortic valve stenosis.   Continue aggressive secondary prevention.  Restart Eliquis in AM.  Antiplatelet recs as noted.    Pain prior to procedure, seemed out of proportion to the degree of CAD.  Wall motion abnormality  present.  May have had some transient occlusion with vasospasm.   No IV antiplatelet agent given due to fairly recent Eliquis dose.  Brilinta given in lab due to rapid onset.  Since she is on Eliquis long term, would switch to Clopidogrel as noted in the orders: 300 mg dose tomorrow and 75 mg daily after that time.    Laboratory Data:  High Sensitivity Troponin:   Recent Labs  Lab 05/13/19 0330 05/13/19 0530  TROPONINIHS 7 17     Chemistry Recent Labs  Lab 05/13/19 0330  NA 136  K 3.9  CL 103  CO2 22  GLUCOSE 117*  BUN 12  CREATININE 1.14*  CALCIUM 8.7*  GFRNONAA 48*  GFRAA 56*  ANIONGAP 11    No results for input(s): PROT, ALBUMIN, AST, ALT, ALKPHOS,  BILITOT in the last 168 hours. Hematology Recent Labs  Lab 05/13/19 0330  WBC 6.0  RBC 4.36  HGB 13.7  HCT 40.2  MCV 92.2  MCH 31.4  MCHC 34.1  RDW 13.2  PLT 200   BNPNo results for input(s): BNP, PROBNP in the last 168 hours.  DDimer No results for input(s): DDIMER in the last 168 hours.   Radiology/Studies:  Ct Angio Chest Pe W And/or Wo Contrast  Result Date: 05/13/2019 CLINICAL DATA:  Complex chest pain EXAM: CT ANGIOGRAPHY CHEST WITH CONTRAST TECHNIQUE: Multidetector CT imaging of the chest was performed using the standard protocol during bolus administration of intravenous contrast. Multiplanar CT image reconstructions and MIPs were obtained to evaluate the vascular anatomy. CONTRAST:  7mL OMNIPAQUE IOHEXOL 350 MG/ML SOLN COMPARISON:  01/05/2017 FINDINGS: Cardiovascular: Normal heart size. No significant pericardial effusion. Lad coronary stenting. Essentially non-opacified aorta. No pulmonary artery filling defect Mediastinum/Nodes: Negative for adenopathy or mass Lungs/Pleura: Mild dependent atelectasis. There is no edema, consolidation, effusion, or pneumothorax. Upper Abdomen: Negative Musculoskeletal: No acute finding. Chronic Schmorl's node at the T11 superior endplate Review of the MIP images confirms the above findings. IMPRESSION: 1. Negative for pulmonary embolism. 2. Dependent atelectasis. Electronically Signed   By: Monte Fantasia M.D.   On: 05/13/2019 07:20   Dg Chest Portable 1 View  Result Date: 05/13/2019 CLINICAL DATA:  Awoke from sleep with chest pain. EXAM: PORTABLE CHEST 1 VIEW COMPARISON:  Radiograph 06/17/2018 FINDINGS: Mild chronic elevation of right hemidiaphragm.The cardiomediastinal contours are normal. The lungs are clear. Pulmonary vasculature is normal. No consolidation, pleural effusion, or pneumothorax. No acute osseous abnormalities are seen. IMPRESSION: No acute chest finding. Electronically Signed   By: Keith Rake M.D.   On: 05/13/2019 03:49      Assessment and Plan:   1. Chest pain 2. Known CAD with prior stents to proximal LAD and OM1 - known CAD with last stent placed 06/2018 - hs troponin x 2 negative - EKG appears nonischemic and sinus rhythm - continue plavix - she has ruled out for acute MI and is now chest pain free after nitro - given the above, will defer angiography in the setting of COVID-19 at this time - will plan to allow her to recover from [place] and then see her back in the office in 21 days with plans for a stress test   3. Paroxysmal atrial fibrillation - EKG in sinus rhythm - she has experienced chest pain in the past when in Afib - question a bout of Afib this morning given her history of symptomatic PAF - continue eliquis, amiodarone, and propranolol   4. Hypertension - she was hypertensive on arrival, but  was also in pain, anxious, and tearful - was 197/104 on arrival, now improved  - continue losartan and spironolactone      For questions or updates, please contact [place] Please consult [email or web address] for contact info under     Signed, Ledora Bottcher, PA  05/13/2019 11:32 AM  Patient seen, examined. Available data reviewed. Agree with findings, assessment, and plan as outlined by Doreene Adas, PA-C.  The patient is independently interviewed and examined.  On my exam, she is alert and oriented in no distress. Vitals:   05/13/19 1330 05/13/19 1345  BP: 133/89 (!) 158/98  Pulse: (!) 58 (!) 56  Resp: (!) 21 13  Temp:    SpO2: 94% 99%   Pt is alert and oriented, NAD HEENT: normal Neck: JVP - normal, carotids 2+= without bruits Lungs: CTA bilaterally CV: RRR without murmur or gallop Abd: soft, NT, Positive BS, no hepatomegaly Ext: no C/C/E, distal pulses intact and equal Skin: warm/dry no rash  EKG shows normal sinus rhythm with no acute changes.  High-sensitivity troponin is negative x2 with serial measurements of 7 and 17 ng/L.  The patient had her typical symptoms of  angina this morning.  She does not feel like this has been typical of atrial fibrillation for her associated symptoms in the past and in fact she feels like she has been maintaining sinus rhythm on low-dose amiodarone.  She is currently chest pain-free.  Her clinical presentation is complicated by the fact that she is COVID-19 positive.  She actually had symptoms approximately 1 month ago that were suggestive of Covid at that time, after her husband tested positive.  She has had no recent fevers, chills, or cough.  I reviewed potential treatment options with the patient.  Recommend discontinuation of IV heparin and nitroglycerin.  Recommend repeat high-sensitivity troponin since it has been approximately 9 hours since her last measurement.  If the high-sensitivity troponin is flat and within normal limits, I would be comfortable discharging her home on medical therapy with sublingual nitroglycerin for as needed use.  We would schedule her for an outpatient Lexiscan Myoview stress test.  However, if her troponin continues to trend upward and/or she has recurrent chest pain, she will require hospital admission and consideration of cardiac catheterization.  [name], M.D. 05/13/2019 2:10 PM

## 2019-05-13 NOTE — ED Notes (Signed)
Pt to CT via stretcher

## 2019-05-13 NOTE — Progress Notes (Signed)
[place] for Heparin Indication: chest pain/ACS  Allergies  Allergen Reactions  . Flomax [Tamsulosin] Other (See Comments)    Caused rapid HYPOTENSION- patient came very close to fainting (also caused weakness and vertigo)  . Isosorbide     Headache  . Other Anxiety and Other (See Comments)    Intolerance to strong pain medications=  Make her feel anxious and feel like coming out of her skin  . Effexor [Venlafaxine] Other (See Comments)    CAUSED TOO MUCH SEDATION; CANNOT TOLERATE  . Statins Other (See Comments)    Restless legs, bad feeling.  This has occurred with Crestor 5 mg once weekly, Lipitor 10 mg twice weekly, and Simvastatin daily  . Zetia [Ezetimibe] Other (See Comments)    Made the legs ache    Patient Measurements: Height: 5\' 1"  (154.9 cm) Weight: 134 lb 4.2 oz (60.9 kg) IBW/kg (Calculated) : 47.8  Vital Signs: Temp: 97.5 F (36.4 C) (12/08 2100) Temp Source: Oral (12/08 2100) BP: 155/88 (12/08 2100) Pulse Rate: 58 (12/08 2100)  Labs: Recent Labs    05/13/19 0330 05/13/19 0530 05/13/19 1359 05/13/19 2104 05/13/19 2306  HGB 13.7  --   --   --   --   HCT 40.2  --   --   --   --   PLT 200  --   --   --   --   APTT  --   --   --   --  44*  HEPARINUNFRC  --   --   --   --  0.83*  CREATININE 1.14*  --   --   --   --   TROPONINIHS 7 17 26* 27*  --     Estimated Creatinine Clearance: 37.3 mL/min (A) (by C-G formula based on SCr of 1.14 mg/dL (H)).  Assessment: 73 y.o. female with chest pain for heparin  Goal of Therapy:  Heparin level 0.3-0.7 units/ml Monitor platelets by anticoagulation protocol: Yes   Plan:  Increase Heparin  900 units/hr Follow-up am labs.   Clarissia Mckeen, Bronson Curb 05/13/2019,11:56 PM

## 2019-05-13 NOTE — ED Notes (Signed)
ED TO INPATIENT HANDOFF REPORT  ED Nurse Name and Phone #: Thurmond Butts [place] Name/Age/Gender Shannon Cummings 73 y.o. female Room/Bed: 032C/032C  Code Status   Code Status: Full Code  Home/SNF/Other Home Patient oriented to: self, place, time and situation Is this baseline? Yes   Triage Complete: Triage complete  Chief Complaint chest pain   Triage Note Pt transported from home by EMS, pt states pain woke her from sleep, central, crushing, pt took 2 nitro at home, EMS gave 2 nitro, no improvement. ASA 324mg  given by EMS. #18 L AC.    Allergies Allergies  Allergen Reactions  . Flomax [Tamsulosin] Other (See Comments)    Caused rapid HYPOTENSION- patient came very close to fainting (also caused weakness and vertigo)  . Isosorbide     Headache  . Other Anxiety and Other (See Comments)    Intolerance to strong pain medications=  Make her feel anxious and feel like coming out of her skin  . Effexor [Venlafaxine] Other (See Comments)    CAUSED TOO MUCH SEDATION; CANNOT TOLERATE  . Statins Other (See Comments)    Restless legs, bad feeling.  This has occurred with Crestor 5 mg once weekly, Lipitor 10 mg twice weekly, and Simvastatin daily  . Zetia [Ezetimibe] Other (See Comments)    Made the legs ache    Level of Care/Admitting Diagnosis ED Disposition    ED Disposition Condition Comment   Admit  Hospital Area: [place] [[id number]]  Level of Care: Telemetry Cardiac [103]  Covid Evaluation: Confirmed COVID Positive  [facility]: [id number]  Admitting Physician: [place], [place]  Attending Physician: [name] [id number]  Estimated length of stay: 3 - 4 days  Certification:: I certify this patient will need inpatient services for at least 2 midnights  Bed request comments: 6e negative pressure room  PT Class (Do Not Modify): Inpatient [101]  PT Acc Code (Do Not Modify): Private [1]       B Medical/Surgery History Past  Medical History:  Diagnosis Date  . Arthritis   . CAD (coronary artery disease)    a. prior stenting history. b. NSTEMI s/p DES to prox LAD with EF 45% by cath 08/14/12. c. Mild trop elevation shortly after cath ?mild plaque embolization - patent stent on relook. // d. Myoview 8/18: EF 60, normal perfusion; Low Risk. e. NSTEMI 06/2018- patent prior LAD stent, 80% OM1 s/p DES, normal LVEDP, EF 45-50%, moderate residual disease treated medically.  . Fatty infiltration of liver   . GERD (gastroesophageal reflux disease)   . Hyperlipidemia   . Hypertension   . Hypokalemia   . Hypotension   . Myocardial infarction ([place]) 2014  . OSA on CPAP   . Osteoporosis   . PAF (paroxysmal atrial fibrillation) ([place])    On rythmol previously  . Pulmonary nodule    a. 45mm subpleural nodular density CT 08/2012, instructed to f/u pulm MD.  . Sinus bradycardia   . Tubular adenoma of colon 2007   Past Surgical History:  Procedure Laterality Date  . CORONARY ANGIOPLASTY WITH STENT PLACEMENT  08/14/2012   LAD  Dr [name]  . CORONARY STENT INTERVENTION N/A 06/14/2018   Procedure: CORONARY STENT INTERVENTION;  Surgeon: Jettie Booze, MD;  Location: [place] [facility];  Service: Cardiovascular;  Laterality: N/A;  . CORONARY STENT PLACEMENT  2000  . hair restoration  02/2017  . LEFT HEART CATH AND CORONARY ANGIOGRAPHY N/A 06/14/2018   Procedure: LEFT HEART  CATH AND CORONARY ANGIOGRAPHY;  Surgeon: Jettie Booze, MD;  Location: [place] [facility];  Service: Cardiovascular;  Laterality: N/A;  . LEFT HEART CATHETERIZATION WITH CORONARY ANGIOGRAM N/A 08/14/2012   Procedure: LEFT HEART CATHETERIZATION WITH CORONARY ANGIOGRAM;  Surgeon: [name], MD;  Location[facility];  Service: Cardiovascular;  Laterality: N/A;  . LEFT HEART CATHETERIZATION WITH CORONARY ANGIOGRAM N/A 08/19/2012   Procedure: LEFT HEART CATHETERIZATION WITH CORONARY ANGIOGRAM;  Surgeon: [name], MD;  Location: [facility] CATH LAB;   Service: Cardiovascular;  Laterality: N/A;  . ROTATOR CUFF REPAIR    . TONSILLECTOMY    . TOTAL ABDOMINAL HYSTERECTOMY       A IV Location/Drains/Wounds Patient Lines/Drains/Airways Status   Active Line/Drains/Airways    Name:   Placement date:   Placement time:   Site:   Days:   Peripheral IV 05/13/19 Left Antecubital   05/13/19    0312    Antecubital   less than 1          Intake/Output Last 24 hours  Intake/Output Summary (Last 24 hours) at 05/13/2019 1844 Last data filed at 05/13/2019 1055 Gross per 24 hour  Intake 1007.75 ml  Output -  Net 1007.75 ml    Labs/Imaging Results for orders placed or performed during the hospital encounter of 05/13/19 (from the past 48 hour(s))  Basic metabolic panel     Status: Abnormal   Collection Time: 05/13/19  3:30 AM  Result Value Ref Range   Sodium 136 135 - 145 mmol/L   Potassium 3.9 3.5 - 5.1 mmol/L   Chloride 103 98 - 111 mmol/L   CO2 22 22 - 32 mmol/L   Glucose, Bld 117 (H) 70 - 99 mg/dL   BUN 12 8 - 23 mg/dL   Creatinine, Ser 1.14 (H) 0.44 - 1.00 mg/dL   Calcium 8.7 (L) 8.9 - 10.3 mg/dL   GFR calc non Af Amer 48 (L) >60 mL/min   GFR calc Af Amer 56 (L) >60 mL/min   Anion gap 11 5 - 15    Comment: Performed at [facility], [address] [place], [place] [address]  Troponin I (High Sensitivity)     Status: None   Collection Time: 05/13/19  3:30 AM  Result Value Ref Range   Troponin I (High Sensitivity) 7 <18 ng/L    Comment: (NOTE) Elevated high sensitivity troponin I (hsTnI) values and significant  changes across serial measurements may suggest ACS but many other  chronic and acute conditions are known to elevate hsTnI results.  Refer to the "Links" section for chest pain algorithms and additional  guidance. Performed at [facility], [place] [address], [place], [place] [address]   CBC     Status: None   Collection Time: 05/13/19  3:30 AM  Result Value Ref Range   WBC 6.0 4.0 - 10.5 K/uL   RBC 4.36  3.87 - 5.11 MIL/uL   Hemoglobin 13.7 12.0 - 15.0 g/dL   HCT 40.2 36.0 - 46.0 %   MCV 92.2 80.0 - 100.0 fL   MCH 31.4 26.0 - 34.0 pg   MCHC 34.1 30.0 - 36.0 g/dL   RDW 13.2 11.5 - 15.5 %   Platelets 200 150 - 400 K/uL   nRBC 0.0 0.0 - 0.2 %    Comment: Performed at [facility], [place] [address] [place], [place] [address]  Respiratory Panel by RT PCR (Flu A&B, Covid) - Nasopharyngeal Swab  Status: Abnormal   Collection Time: 05/13/19  4:34 AM   Specimen: Nasopharyngeal Swab  Result Value Ref Range   SARS Coronavirus 2 by RT PCR POSITIVE (A) NEGATIVE    Comment: RESULT CALLED TO, READ BACK BY AND VERIFIED WITH: A. DENNIS,RN 0600 05/13/2019 T. TYSOR (NOTE) SARS-CoV-2 target nucleic acids are DETECTED. SARS-CoV-2 RNA is generally detectable in upper respiratory specimens  during the acute phase of infection. Positive results are indicative of the presence of the identified virus, but do not rule out bacterial infection or co-infection with other pathogens not detected by the test. Clinical correlation with patient history and other diagnostic information is necessary to determine patient infection status. The expected result is Negative. Fact Sheet for Patients:  PinkCheek.be Fact Sheet for Healthcare Providers: GravelBags.it This test is not yet approved or cleared by the [place] FDA and  has been authorized for detection and/or diagnosis of SARS-CoV-2 by FDA under an Emergency Use Authorization (EUA).  This EUA will remain in effect (meaning this test can be used)  for the duration of  the COVID-19 declaration under Section 564(b)(1) of the Act, 21 U.S.C. section 360bbb-3(b)(1), unless the authorization is terminated or revoked sooner.    Influenza A by PCR NEGATIVE NEGATIVE   Influenza B by PCR NEGATIVE NEGATIVE    Comment: (NOTE) The Xpert Xpress SARS-CoV-2/FLU/RSV assay is intended as an aid in  the  diagnosis of influenza from Nasopharyngeal swab specimens and  should not be used as a sole basis for treatment. Nasal washings and  aspirates are unacceptable for Xpert Xpress SARS-CoV-2/FLU/RSV  testing. Fact Sheet for Patients: PinkCheek.be Fact Sheet for Healthcare Providers: GravelBags.it This test is not yet approved or cleared by the [place] FDA and  has been authorized for detection and/or diagnosis of SARS-CoV-2 by  FDA under an Emergency Use Authorization (EUA). This EUA will remain  in effect (meaning this test can be used) for the duration of the  Covid-19 declaration under Section 564(b)(1) of the Act, 21  U.S.C. section 360bbb-3(b)(1), unless the authorization is  terminated or revoked. Performed at [facility], [place] [address] [place], [place] [address]   Troponin I (High Sensitivity)     Status: None   Collection Time: 05/13/19  5:30 AM  Result Value Ref Range   Troponin I (High Sensitivity) 17 <18 ng/L    Comment: (NOTE) Elevated high sensitivity troponin I (hsTnI) values and significant  changes across serial measurements may suggest ACS but many other  chronic and acute conditions are known to elevate hsTnI results.  Refer to the "Links" section for chest pain algorithms and additional  guidance. Performed at [facility], [place] [address] [place], [place] [address]   Troponin I (High Sensitivity)     Status: Abnormal   Collection Time: 05/13/19  1:59 PM  Result Value Ref Range   Troponin I (High Sensitivity) 26 (H) <18 ng/L    Comment: (NOTE) Elevated high sensitivity troponin I (hsTnI) values and significant  changes across serial measurements may suggest ACS but many other  chronic and acute conditions are known to elevate hsTnI results.  Refer to the "Links" section for chest pain algorithms and additional  guidance. Performed at [facility], [place] [address]  [place], [place] [address]    Ct Angio Chest Pe W And/or Wo Contrast  Result Date: 05/13/2019 CLINICAL DATA:  Complex chest pain EXAM: CT ANGIOGRAPHY CHEST WITH CONTRAST TECHNIQUE: Multidetector CT imaging of the  chest was performed using the standard protocol during bolus administration of intravenous contrast. Multiplanar CT image reconstructions and MIPs were obtained to evaluate the vascular anatomy. CONTRAST:  64mL OMNIPAQUE IOHEXOL 350 MG/ML SOLN COMPARISON:  01/05/2017 FINDINGS: Cardiovascular: Normal heart size. No significant pericardial effusion. Lad coronary stenting. Essentially non-opacified aorta. No pulmonary artery filling defect Mediastinum/Nodes: Negative for adenopathy or mass Lungs/Pleura: Mild dependent atelectasis. There is no edema, consolidation, effusion, or pneumothorax. Upper Abdomen: Negative Musculoskeletal: No acute finding. Chronic Schmorl's node at the T11 superior endplate Review of the MIP images confirms the above findings. IMPRESSION: 1. Negative for pulmonary embolism. 2. Dependent atelectasis. Electronically Signed   By: Monte Fantasia M.D.   On: 05/13/2019 07:20   Dg Chest Portable 1 View  Result Date: 05/13/2019 CLINICAL DATA:  Awoke from sleep with chest pain. EXAM: PORTABLE CHEST 1 VIEW COMPARISON:  Radiograph 06/17/2018 FINDINGS: Mild chronic elevation of right hemidiaphragm.The cardiomediastinal contours are normal. The lungs are clear. Pulmonary vasculature is normal. No consolidation, pleural effusion, or pneumothorax. No acute osseous abnormalities are seen. IMPRESSION: No acute chest finding. Electronically Signed   By: Keith Rake M.D.   On: 05/13/2019 03:49    Pending Labs Unresulted Labs (From admission, onward)    Start     Ordered   05/14/19 0500  Heparin level (unfractionated)  Daily,   R     05/13/19 0700   05/14/19 0500  CBC9  Daily,   R     05/13/19 0700   05/14/19 [id number]  Basic metabolic panel  Tomorrow morning,   R     05/13/19 1549    05/14/19 0500  Lipid panel  Tomorrow morning,   R     05/13/19 1549   05/13/19 2300  Heparin level (unfractionated)  Once-Timed,   STAT     05/13/19 1628   05/13/19 2300  APTT  Once-Timed,   STAT     05/13/19 1628          Vitals/Pain Today's Vitals   05/13/19 1715 05/13/19 1717 05/13/19 1730 05/13/19 1745  BP: (!) 142/88  (!) 149/90 (!) 159/88  Pulse: 60  (!) 59 (!) 56  Resp: 13  11 13   Temp:      TempSrc:      SpO2: 99%  98% 98%  Weight:      Height:      PainSc:  3       Isolation Precautions No active isolations  Medications Medications  heparin ADULT infusion 100 units/mL (25000 units/272mL sodium chloride 0.45%) (700 Units/hr Intravenous New Bag/Given 05/13/19 1509)  nitroGLYCERIN 50 mg in dextrose 5 % 250 mL (0.2 mg/mL) infusion (0 mcg/min Intravenous Paused 05/13/19 1359)  amiodarone (PACERONE) tablet 100 mg (100 mg Oral Given 05/13/19 1646)  losartan (COZAAR) tablet 50 mg (50 mg Oral Given 05/13/19 1646)  pantoprazole (PROTONIX) EC tablet 40 mg (has no administration in time range)  clopidogrel (PLAVIX) tablet 75 mg (75 mg Oral Given 05/13/19 1646)  aspirin EC tablet 81 mg (has no administration in time range)  nitroGLYCERIN (NITROSTAT) SL tablet 0.4 mg (has no administration in time range)  acetaminophen (TYLENOL) tablet 650 mg (650 mg Oral Given 05/13/19 1614)  ondansetron (ZOFRAN) injection 4 mg (has no administration in time range)  icosapent Ethyl (VASCEPA) 1 g capsule 2 g (has no administration in time range)  nitroGLYCERIN (NITROGLYN) 2 % ointment 1 inch (1 inch Topical Given 05/13/19 0341)  morphine 4 MG/ML injection 4 mg (4 mg Intravenous Given  05/13/19 0350)  ondansetron (ZOFRAN) injection 4 mg (4 mg Intravenous Given 05/13/19 0346)  morphine 4 MG/ML injection 4 mg (4 mg Intravenous Given 05/13/19 0832)  sodium chloride 0.9 % bolus 1,000 mL (0 mLs Intravenous Stopped 05/13/19 1055)  heparin bolus via infusion 3,000 Units (3,000 Units Intravenous Bolus from Bag  05/13/19 0836)  iohexol (OMNIPAQUE) 350 MG/ML injection 100 mL (60 mLs Intravenous Contrast Given 05/13/19 0712)  ondansetron (ZOFRAN) injection 4 mg (4 mg Intravenous Given 05/13/19 1224)  acetaminophen (TYLENOL) tablet 500 mg (500 mg Oral Given 05/13/19 1223)    Mobility walks Low fall risk   Focused Assessments    R Recommendations: See Admitting Provider Note  Report given to:   Additional Notes:

## 2019-05-13 NOTE — ED Provider Notes (Signed)
[facility] EMERGENCY DEPARTMENT Provider Note   CSN: [id number] Arrival date & time: 05/13/19  0315     History   Chief Complaint Chief Complaint  Patient presents with  . Chest Pain    HPI Shannon Cummings is a 73 y.o. female.     HPI  This is a 73 year old female with a history of coronary artery disease status post stent placement last in January 2020, hypertension, hyperlipidemia, atrial fibrillation who presents with chest pain.  Patient reports onset of symptoms upon awaking around 1:30 AM.  She reports pressure-like chest pain that radiates into her shoulders.  Currently her pain is 8 out of 10.  She reports associated shortness of breath.  She states that this feels similar to her prior heart attacks.  She did take sublingual nitroglycerin with no relief.  She denies any recent fevers or cough.  She does report that she and her husband were Covid positive approximately 1 month ago.  Has not noted any leg swelling or history of PEs.  After clarification, patient reports that she was never tested but her husband did test positive and she was presumed positive.  Past Medical History:  Diagnosis Date  . Arthritis   . CAD (coronary artery disease)    a. prior stenting history. b. NSTEMI s/p DES to prox LAD with EF 45% by cath 08/14/12. c. Mild trop elevation shortly after cath ?mild plaque embolization - patent stent on relook. // d. Myoview 8/18: EF 60, normal perfusion; Low Risk. e. NSTEMI 06/2018- patent prior LAD stent, 80% OM1 s/p DES, normal LVEDP, EF 45-50%, moderate residual disease treated medically.  . Fatty infiltration of liver   . GERD (gastroesophageal reflux disease)   . Hyperlipidemia   . Hypertension   . Hypokalemia   . Hypotension   . Myocardial infarction ([place]) 2014  . OSA on CPAP   . Osteoporosis   . PAF (paroxysmal atrial fibrillation) ([place])    On rythmol previously  . Pulmonary nodule    a. 24mm subpleural nodular density CT 08/2012,  instructed to f/u pulm MD.  . Sinus bradycardia   . Tubular adenoma of colon 2007    Patient Active Problem List   Diagnosis Date Noted  . Sinus bradycardia 06/15/2018  . Ischemic cardiomyopathy 06/15/2018  . NSTEMI (non-ST elevated myocardial infarction) ([place]) 06/13/2018  . GERD (gastroesophageal reflux disease) 07/10/2016  . History of colonic polyps 02/25/2016  . Antiplatelet or antithrombotic long-term use 02/25/2016  . Nodule of right lung 10/13/2012  . History of non-ST elevation myocardial infarction (NSTEMI) 08/14/2012  . Hepatic steatosis 12/03/2011  . Paroxysmal atrial fibrillation ([place]) 12/22/2010  . CAD (coronary artery disease) 12/22/2010  . INSOMNIA 04/13/2010  . Hyperlipidemia 03/19/2010  . Essential hypertension 03/19/2010  . Obstructive sleep apnea 03/17/2010    Past Surgical History:  Procedure Laterality Date  . CORONARY ANGIOPLASTY WITH STENT PLACEMENT  08/14/2012   LAD  Dr [name]  . CORONARY STENT INTERVENTION N/A 06/14/2018   Procedure: CORONARY STENT INTERVENTION;  Surgeon: Jettie Booze, MD;  Location: [facility];  Service: Cardiovascular;  Laterality: N/A;  . CORONARY STENT PLACEMENT  2000  . hair restoration  02/2017  . LEFT HEART CATH AND CORONARY ANGIOGRAPHY N/A 06/14/2018   Procedure: LEFT HEART CATH AND CORONARY ANGIOGRAPHY;  Surgeon: Jettie Booze, MD;  Location: [facility];  Service: Cardiovascular;  Laterality: N/A;  . LEFT HEART CATHETERIZATION WITH CORONARY ANGIOGRAM N/A 08/14/2012   Procedure:  LEFT HEART CATHETERIZATION WITH CORONARY ANGIOGRAM;  Surgeon: [name], MD;  Location: [facility] CATH LAB;  Service: Cardiovascular;  Laterality: N/A;  . LEFT HEART CATHETERIZATION WITH CORONARY ANGIOGRAM N/A 08/19/2012   Procedure: LEFT HEART CATHETERIZATION WITH CORONARY ANGIOGRAM;  Surgeon: [name], MD;  Location: [facility] CATH LAB;  Service: Cardiovascular;  Laterality: N/A;  . ROTATOR CUFF REPAIR    . TONSILLECTOMY     . TOTAL ABDOMINAL HYSTERECTOMY       OB History   No obstetric history on file.      Home Medications    Prior to Admission medications   Medication Sig Start Date End Date Taking? Authorizing Provider  acetaminophen (TYLENOL) 650 MG CR tablet Take 650 mg by mouth every 8 (eight) hours as needed for pain.   Yes [provider]  Alirocumab (PRALUENT) 75 MG/ML SOAJ Inject 75 mg into the skin every 14 (fourteen) days. 08/13/18  Yes Nahser, Wonda Cheng, MD  amiodarone (PACERONE) 200 MG tablet Take 0.5 tablets (100 mg total) by mouth daily. 08/26/18  Yes Allred, Jeneen Rinks, MD  Cholecalciferol (VITAMIN D3) 25 MCG (1000 UT) CAPS Take 1,000 Units by mouth daily.   Yes [provider]  clopidogrel (PLAVIX) 75 MG tablet Take 1 tablet (75 mg total) by mouth daily. 06/16/18  Yes Dunn, Dayna N, PA-C  Cyanocobalamin (B-12) 1000 MCG LOZG Take 1,000 mcg by mouth daily.    Yes [provider]  ELIQUIS 5 MG TABS tablet TAKE 1 TABLET(5 MG) BY MOUTH TWICE DAILY Patient taking differently: Take 5 mg by mouth 2 (two) times daily.  04/07/19  Yes Nahser, Wonda Cheng, MD  fluticasone [facility]) 50 MCG/ACT nasal spray Place 2 sprays into both nostrils daily as needed for allergies or rhinitis.   Yes [provider]  gabapentin (NEURONTIN) 300 MG capsule Take 1 capsule (300 mg total) by mouth 3 (three) times daily. Patient taking differently: Take 300 mg by mouth 3 (three) times daily as needed (for pain).  03/26/19  Yes Virgel Manifold, MD  losartan (COZAAR) 25 MG tablet Take 2 tablets (50 mg total) by mouth daily. 06/18/18 05/13/19 Yes Nahser, Wonda Cheng, MD  nitroGLYCERIN (NITROSTAT) 0.4 MG SL tablet Place 1 tablet (0.4 mg total) under the tongue every 5 (five) minutes as needed for chest pain (up to 3 doses). Patient taking differently: Place 0.4 mg under the tongue every 5 (five) minutes x 3 doses as needed for chest pain.  06/18/18  Yes Nahser, Wonda Cheng, MD  pantoprazole (PROTONIX) 40 MG  tablet TAKE 1 TABLET(40 MG) BY MOUTH DAILY Patient taking differently: Take 40 mg by mouth daily before breakfast.  04/08/19  Yes [name], MD  potassium chloride (K-DUR) 10 MEQ tablet TAKE 1 TABLET(10 MEQ) BY MOUTH DAILY Patient taking differently: Take 10 mEq by mouth daily.  11/08/18  Yes Nahser, Wonda Cheng, MD  propranolol (INDERAL) 10 MG tablet Take 1 tablet (10 mg total) by mouth 4 (four) times daily as needed. Patient taking differently: Take 10 mg by mouth 4 (four) times daily as needed ("for A-Fib attacks").  06/18/18  Yes Nahser, Wonda Cheng, MD  spironolactone (ALDACTONE) 25 MG tablet Take 0.5 tablets (12.5 mg total) by mouth daily. Patient taking differently: Take 25 mg by mouth every other day.  07/31/18  Yes Nahser, Wonda Cheng, MD  AMBULATORY NON FORMULARY MEDICATION Take 180 mg by mouth daily. Medication Name: bempedoic acid 180 mg vs a placebo.  CLEAR Research Study, drug provided 12/21/15  Lelon Perla, MD  Icosapent Ethyl (VASCEPA) 1 g CAPS Take 2 capsules (2 g total) by mouth 2 (two) times daily. Patient not taking: Reported on 05/13/2019 08/12/18   Nahser, Wonda Cheng, MD    Family History Family History  Problem Relation Age of Onset  . Heart disease Father   . Alzheimer's disease Mother   . Breast cancer Maternal Aunt   . Colon cancer Neg Hx     Social History Social History   Tobacco Use  . Smoking status: Never Smoker  . Smokeless tobacco: Never Used  Substance Use Topics  . Alcohol use: Yes    Comment: 2 drinks daily   . Drug use: No     Allergies   Flomax [tamsulosin], Isosorbide, Other, Effexor [venlafaxine], Statins, and Zetia [ezetimibe]   Review of Systems Review of Systems  Constitutional: Negative for fever.  Respiratory: Positive for chest tightness and shortness of breath. Negative for cough.   Cardiovascular: Positive for chest pain. Negative for leg swelling.  Gastrointestinal: Negative for abdominal pain, nausea and vomiting.  All other  systems reviewed and are negative.    Physical Exam Updated Vital Signs BP 108/85   Pulse 63   Temp 97.8 F (36.6 C) (Oral)   Resp 10   Ht 1.549 m (5\' 1" )   Wt 60.9 kg   SpO2 99%   BMI 25.37 kg/m   Physical Exam Vitals signs and nursing note reviewed.  Constitutional:      Appearance: She is well-developed.     Comments: Tearful but nontoxic-appearing  HENT:     Head: Normocephalic and atraumatic.  Eyes:     Pupils: Pupils are equal, round, and reactive to light.  Neck:     Musculoskeletal: Neck supple.  Cardiovascular:     Rate and Rhythm: Normal rate and regular rhythm.     Heart sounds: Normal heart sounds.  Pulmonary:     Effort: Pulmonary effort is normal. No respiratory distress.     Breath sounds: No wheezing.  Abdominal:     General: Bowel sounds are normal.     Palpations: Abdomen is soft.  Musculoskeletal:     Right lower leg: She exhibits no tenderness. No edema.     Left lower leg: She exhibits no tenderness. No edema.  Skin:    General: Skin is warm and dry.  Neurological:     Mental Status: She is alert and oriented to person, place, and time.  Psychiatric:        Mood and Affect: Mood normal.      ED Treatments / Results  Labs (all labs ordered are listed, but only abnormal results are displayed) Labs Reviewed  RESPIRATORY PANEL BY RT PCR (FLU A&B, COVID) - Abnormal; Notable for the following components:      Result Value   SARS Coronavirus 2 by RT PCR POSITIVE (*)    All other components within normal limits  BASIC METABOLIC PANEL - Abnormal; Notable for the following components:   Glucose, Bld 117 (*)    Creatinine, Ser 1.14 (*)    Calcium 8.7 (*)    GFR calc non Af Amer 48 (*)    GFR calc Af Amer 56 (*)    All other components within normal limits  CBC  HEPARIN LEVEL (UNFRACTIONATED)  CBG MONITORING, ED  TROPONIN I (HIGH SENSITIVITY)  TROPONIN I (HIGH SENSITIVITY)    EKG EKG Interpretation  Date/Time:  Tuesday May 13 2019 03:30:37 EST Ventricular Rate:  77 PR Interval:    QRS Duration: 98 QT Interval:  415 QTC Calculation: 470 R Axis:   18 Text Interpretation: Sinus rhythm Low voltage, precordial leads Baseline wander in lead(s) V2 V3 No STEMI Confirmed by Thayer Jew [phone]) on 05/13/2019 3:35:34 AM   Radiology Ct Angio Chest Pe W And/or Wo Contrast  Result Date: 05/13/2019 CLINICAL DATA:  Complex chest pain EXAM: CT ANGIOGRAPHY CHEST WITH CONTRAST TECHNIQUE: Multidetector CT imaging of the chest was performed using the standard protocol during bolus administration of intravenous contrast. Multiplanar CT image reconstructions and MIPs were obtained to evaluate the vascular anatomy. CONTRAST:  29mL OMNIPAQUE IOHEXOL 350 MG/ML SOLN COMPARISON:  01/05/2017 FINDINGS: Cardiovascular: Normal heart size. No significant pericardial effusion. Lad coronary stenting. Essentially non-opacified aorta. No pulmonary artery filling defect Mediastinum/Nodes: Negative for adenopathy or mass Lungs/Pleura: Mild dependent atelectasis. There is no edema, consolidation, effusion, or pneumothorax. Upper Abdomen: Negative Musculoskeletal: No acute finding. Chronic Schmorl's node at the T11 superior endplate Review of the MIP images confirms the above findings. IMPRESSION: 1. Negative for pulmonary embolism. 2. Dependent atelectasis. Electronically Signed   By: Monte Fantasia M.D.   On: 05/13/2019 07:20   Dg Chest Portable 1 View  Result Date: 05/13/2019 CLINICAL DATA:  Awoke from sleep with chest pain. EXAM: PORTABLE CHEST 1 VIEW COMPARISON:  Radiograph 06/17/2018 FINDINGS: Mild chronic elevation of right hemidiaphragm.The cardiomediastinal contours are normal. The lungs are clear. Pulmonary vasculature is normal. No consolidation, pleural effusion, or pneumothorax. No acute osseous abnormalities are seen. IMPRESSION: No acute chest finding. Electronically Signed   By: Keith Rake M.D.   On: 05/13/2019 03:49     Procedures Procedures (including critical care time)  CRITICAL CARE Performed by: Merryl Hacker   Total critical care time: 45 minutes  Critical care time was exclusive of separately billable procedures and treating other patients.  Critical care was necessary to treat or prevent imminent or life-threatening deterioration.  Critical care was time spent personally by me on the following activities: development of treatment plan with patient and/or surrogate as well as nursing, discussions with consultants, evaluation of patient's response to treatment, examination of patient, obtaining history from patient or surrogate, ordering and performing treatments and interventions, ordering and review of laboratory studies, ordering and review of radiographic studies, pulse oximetry and re-evaluation of patient's condition.   Medications Ordered in ED Medications  morphine 4 MG/ML injection 4 mg (has no administration in time range)  sodium chloride 0.9 % bolus 1,000 mL (has no administration in time range)  heparin bolus via infusion 3,000 Units (has no administration in time range)  heparin ADULT infusion 100 units/mL (25000 units/248mL sodium chloride 0.45%) (has no administration in time range)  nitroGLYCERIN 50 mg in dextrose 5 % 250 mL (0.2 mg/mL) infusion (has no administration in time range)  nitroGLYCERIN (NITROGLYN) 2 % ointment 1 inch (1 inch Topical Given 05/13/19 0341)  morphine 4 MG/ML injection 4 mg (4 mg Intravenous Given 05/13/19 0350)  ondansetron (ZOFRAN) injection 4 mg (4 mg Intravenous Given 05/13/19 0346)  iohexol (OMNIPAQUE) 350 MG/ML injection 100 mL (60 mLs Intravenous Contrast Given 05/13/19 [id number])     Initial Impression / Assessment and Plan / ED Course  I have reviewed the triage vital signs and the nursing notes.  Pertinent labs & imaging results that were available during my care of the patient were reviewed by me and considered in my medical decision making  (see chart for details).  Patient presents with chest pain.  She is tearful and anxious appearing.  She is hypertensive on initial evaluation.  She describes chest pain similar to her prior MIs.  EKG shows no signs of acute ischemia.  Initial troponin is 7.  This is reassuring although patient is high risk and her description is concerning.  Patient was initially given morphine and Nitropaste with some improvement of pain but she continues to have some ongoing discomfort.  Of note, she did test Covid positive.  She reports approximately 1 week of symptoms 1 month ago when her husband was positive and she was presumptively positive.  Given the Covid result, this would put her at risk for PE.  Repeat troponin ordered.  Given her pretest probability for PE, CT scan was obtained and does not show any evidence of pulmonary embolism.  7:39 AM Patient and her husband updated.  She reports that she has had some improvement of pain but continues to have low level 2-3 out of 10 pain.  IV heparin and IV nitroglycerin ordered.  Repeat morphine ordered.  Will discuss with cardiology for admission given her risk factors and high heart score.  Suspect she may have some unstable angina.  Final Clinical Impressions(s) / ED Diagnoses   Final diagnoses:  Angina at rest [facility])    ED Discharge Orders    None       Dina Rich, Barbette Hair, MD 05/13/19 [phone]

## 2019-05-14 ENCOUNTER — Encounter (HOSPITAL_COMMUNITY): Payer: Self-pay | Admitting: Cardiovascular Disease

## 2019-05-14 ENCOUNTER — Encounter (HOSPITAL_COMMUNITY)
Admission: EM | Disposition: A | Discharge status: Home / Self Care | Payer: Self-pay | Source: Home / Self Care | Attending: Emergency Medicine

## 2019-05-14 DIAGNOSIS — I2510 Atherosclerotic heart disease of native coronary artery without angina pectoris: Secondary | ICD-10-CM | POA: Diagnosis not present

## 2019-05-14 DIAGNOSIS — I214 Non-ST elevation (NSTEMI) myocardial infarction: Secondary | ICD-10-CM | POA: Diagnosis not present

## 2019-05-14 DIAGNOSIS — Z955 Presence of coronary angioplasty implant and graft: Secondary | ICD-10-CM | POA: Diagnosis not present

## 2019-05-14 DIAGNOSIS — I5022 Chronic systolic (congestive) heart failure: Secondary | ICD-10-CM | POA: Diagnosis not present

## 2019-05-14 DIAGNOSIS — Z7902 Long term (current) use of antithrombotics/antiplatelets: Secondary | ICD-10-CM | POA: Diagnosis not present

## 2019-05-14 DIAGNOSIS — I480 Paroxysmal atrial fibrillation: Secondary | ICD-10-CM | POA: Diagnosis not present

## 2019-05-14 DIAGNOSIS — G4733 Obstructive sleep apnea (adult) (pediatric): Secondary | ICD-10-CM | POA: Diagnosis not present

## 2019-05-14 DIAGNOSIS — I25110 Atherosclerotic heart disease of native coronary artery with unstable angina pectoris: Secondary | ICD-10-CM | POA: Diagnosis not present

## 2019-05-14 DIAGNOSIS — I252 Old myocardial infarction: Secondary | ICD-10-CM | POA: Diagnosis not present

## 2019-05-14 DIAGNOSIS — Z7901 Long term (current) use of anticoagulants: Secondary | ICD-10-CM | POA: Diagnosis not present

## 2019-05-14 DIAGNOSIS — E785 Hyperlipidemia, unspecified: Secondary | ICD-10-CM | POA: Diagnosis not present

## 2019-05-14 DIAGNOSIS — U071 COVID-19: Secondary | ICD-10-CM | POA: Diagnosis not present

## 2019-05-14 DIAGNOSIS — I110 Hypertensive heart disease with heart failure: Secondary | ICD-10-CM | POA: Diagnosis not present

## 2019-05-14 HISTORY — PX: LEFT HEART CATH AND CORONARY ANGIOGRAPHY: CATH118249

## 2019-05-14 LAB — BASIC METABOLIC PANEL
Anion gap: 10 (ref 5–15)
BUN: 8 mg/dL (ref 8–23)
CO2: 25 mmol/L (ref 22–32)
Calcium: 8.2 mg/dL — ABNORMAL LOW (ref 8.9–10.3)
Chloride: 103 mmol/L (ref 98–111)
Creatinine, Ser: 1.03 mg/dL — ABNORMAL HIGH (ref 0.44–1.00)
GFR calc Af Amer: >60 mL/min (ref >60)
GFR calc non Af Amer: 54 mL/min — ABNORMAL LOW (ref >60)
Glucose, Bld: 94 mg/dL (ref 70–99)
Potassium: 3.7 mmol/L (ref 3.5–5.1)
Sodium: 138 mmol/L (ref 135–145)

## 2019-05-14 LAB — CBC
HCT: 42 % (ref 36.0–46.0)
Hemoglobin: 14.1 g/dL (ref 12.0–15.0)
MCH: 31.6 pg (ref 26.0–34.0)
MCHC: 33.6 g/dL (ref 30.0–36.0)
MCV: 94.2 fL (ref 80.0–100.0)
Platelets: 183 10*3/uL (ref 150–400)
RBC: 4.46 MIL/uL (ref 3.87–5.11)
RDW: 13.4 % (ref 11.5–15.5)
WBC: 5.6 10*3/uL (ref 4.0–10.5)
nRBC: 0 % (ref 0.0–0.2)

## 2019-05-14 LAB — TROPONIN I (HIGH SENSITIVITY): Troponin I (High Sensitivity): 25 ng/L — ABNORMAL HIGH (ref <18)

## 2019-05-14 LAB — LIPID PANEL
Cholesterol: 200 mg/dL (ref 0–200)
HDL: 63 mg/dL (ref >40)
LDL Cholesterol: 116 mg/dL — ABNORMAL HIGH (ref 0–99)
Total CHOL/HDL Ratio: 3.2 RATIO
Triglycerides: 105 mg/dL (ref <150)
VLDL: 21 mg/dL (ref 0–40)

## 2019-05-14 LAB — HEPARIN LEVEL (UNFRACTIONATED): Heparin Unfractionated: 0.89 IU/mL — ABNORMAL HIGH (ref 0.30–0.70)

## 2019-05-14 LAB — APTT: aPTT: 79 seconds — ABNORMAL HIGH (ref 24–36)

## 2019-05-14 SURGERY — LEFT HEART CATH AND CORONARY ANGIOGRAPHY
Anesthesia: LOCAL

## 2019-05-14 MED ORDER — FENTANYL CITRATE (PF) 100 MCG/2ML IJ SOLN
INTRAMUSCULAR | Status: AC
  Filled 2019-05-14: qty 2

## 2019-05-14 MED ORDER — MIDAZOLAM HCL 2 MG/2ML IJ SOLN
INTRAMUSCULAR | Status: AC
  Filled 2019-05-14: qty 2

## 2019-05-14 MED ORDER — VERAPAMIL HCL 2.5 MG/ML IV SOLN
INTRAVENOUS | Status: AC
  Filled 2019-05-14: qty 2

## 2019-05-14 MED ORDER — SODIUM CHLORIDE 0.9 % IV SOLN: 250.0000 mL | INTRAVENOUS | Status: DC | PRN

## 2019-05-14 MED ORDER — SODIUM CHLORIDE 0.9 % WEIGHT BASED INFUSION
1.0000 mL/kg/h | INTRAVENOUS | Status: DC
  Administered 2019-05-14: 10:00:00 1 mL/kg/h via INTRAVENOUS

## 2019-05-14 MED ORDER — BIVALIRUDIN TRIFLUOROACETATE 250 MG IV SOLR
INTRAVENOUS | Status: AC
  Filled 2019-05-14: qty 250

## 2019-05-14 MED ORDER — FENTANYL CITRATE (PF) 100 MCG/2ML IJ SOLN
INTRAMUSCULAR | Status: DC | PRN
  Administered 2019-05-14: 25 ug via INTRAVENOUS

## 2019-05-14 MED ORDER — HEPARIN (PORCINE) IN NACL 1000-0.9 UT/500ML-% IV SOLN
INTRAVENOUS | Status: AC
  Filled 2019-05-14: qty 1000

## 2019-05-14 MED ORDER — HEPARIN (PORCINE) IN NACL 1000-0.9 UT/500ML-% IV SOLN
INTRAVENOUS | Status: DC | PRN
  Administered 2019-05-14 (×2): 500 mL

## 2019-05-14 MED ORDER — SODIUM CHLORIDE 0.9% FLUSH: 3.0000 mL | Freq: Two times a day (BID) | INTRAVENOUS | Status: DC

## 2019-05-14 MED ORDER — IOHEXOL 350 MG/ML SOLN
INTRAVENOUS | Status: DC | PRN
  Administered 2019-05-14: 45 mL

## 2019-05-14 MED ORDER — SODIUM CHLORIDE 0.9% FLUSH: 3.0000 mL | INTRAVENOUS | Status: DC | PRN

## 2019-05-14 MED ORDER — LIDOCAINE HCL (PF) 1 % IJ SOLN
INTRAMUSCULAR | Status: AC
  Filled 2019-05-14: qty 30

## 2019-05-14 MED ORDER — MIDAZOLAM HCL 2 MG/2ML IJ SOLN
INTRAMUSCULAR | Status: DC | PRN
  Administered 2019-05-14: 2 mg via INTRAVENOUS

## 2019-05-14 MED ORDER — NITROGLYCERIN 0.4 MG SL SUBL: 0.4000 mg | SUBLINGUAL_TABLET | SUBLINGUAL | 3 refills | Status: DC | PRN

## 2019-05-14 MED ORDER — VERAPAMIL HCL 2.5 MG/ML IV SOLN
INTRAVENOUS | Status: DC | PRN
  Administered 2019-05-14: 09:00:00 10 mL via INTRA_ARTERIAL

## 2019-05-14 MED ORDER — HYDRALAZINE HCL 20 MG/ML IJ SOLN: 10.0000 mg | INTRAMUSCULAR | Status: AC | PRN

## 2019-05-14 MED ORDER — LABETALOL HCL 5 MG/ML IV SOLN: 10.0000 mg | INTRAVENOUS | Status: AC | PRN

## 2019-05-14 MED ORDER — HEPARIN SODIUM (PORCINE) 1000 UNIT/ML IJ SOLN
INTRAMUSCULAR | Status: DC | PRN
  Administered 2019-05-14: 3000 [IU] via INTRAVENOUS

## 2019-05-14 MED ORDER — AMLODIPINE BESYLATE 2.5 MG PO TABS: 2.5000 mg | ORAL_TABLET | Freq: Every day | ORAL | 11 refills | Status: DC

## 2019-05-14 MED ORDER — NITROGLYCERIN 1 MG/10 ML FOR IR/CATH LAB
INTRA_ARTERIAL | Status: AC
  Filled 2019-05-14: qty 10

## 2019-05-14 MED ORDER — LIDOCAINE HCL (PF) 1 % IJ SOLN
INTRAMUSCULAR | Status: DC | PRN
  Administered 2019-05-14: 2 mL

## 2019-05-14 MED FILL — NITROGLYCERIN 0.4 MG TAB SL: 0.4 | 8 days supply | Qty: 25 | Fill #0

## 2019-05-14 MED FILL — AMLODIPINE BESYLATE 2.5 MG: 2.5 | 30 days supply | Qty: 30 | Fill #0

## 2019-05-14 SURGICAL SUPPLY — 10 items
CATH 5FR JL3.5 JR4 ANG PIG MP (CATHETERS) ×1 IMPLANT
DEVICE RAD TR BAND REGULAR (VASCULAR PRODUCTS) ×1 IMPLANT
GLIDESHEATH SLEND SS 6F .021 (SHEATH) ×1 IMPLANT
GUIDEWIRE INQWIRE 1.5J.035X260 (WIRE) IMPLANT
INQWIRE 1.5J .035X260CM (WIRE) ×2
KIT HEART LEFT (KITS) ×2 IMPLANT
PACK CARDIAC CATHETERIZATION (CUSTOM PROCEDURE TRAY) ×2 IMPLANT
SHEATH PROBE COVER 6X72 (BAG) ×1 IMPLANT
TRANSDUCER W/STOPCOCK (MISCELLANEOUS) ×2 IMPLANT
TUBING CIL FLEX 10 FLL-RA (TUBING) ×2 IMPLANT

## 2019-05-14 NOTE — Interval H&P Note (Signed)
Cath Lab Visit (complete for each Cath Lab visit)  Clinical Evaluation Leading to the Procedure:   ACS: Yes.    Non-ACS:    Anginal Classification: CCS IV  Anti-ischemic medical therapy: Minimal Therapy (1 class of medications)  Non-Invasive Test Results: No non-invasive testing performed  Prior CABG: No previous CABG      History and Physical Interval Note:  05/14/2019 8:55 AM  Shannon Cummings  has presented today for surgery, with the diagnosis of cad.  The various methods of treatment have been discussed with the patient and family. After consideration of risks, benefits and other options for treatment, the patient has consented to  Procedure(s): CORONARY STENT INTERVENTION (N/A) as a surgical intervention.  The patient's history has been reviewed, patient examined, no change in status, stable for surgery.  I have reviewed the patient's chart and labs.  Questions were answered to the patient's satisfaction.     Shannon Cummings

## 2019-05-14 NOTE — Care Management Obs Status (Signed)
[place] NOTIFICATION   Patient Details  Name: Shannon Cummings MRN: [id number] Date of Birth: [birth year]   Medicare Observation Status Notification Given:  Yes    Bethena Roys, RN 05/14/2019, 3:48 PM

## 2019-05-14 NOTE — Plan of Care (Signed)

## 2019-05-14 NOTE — Discharge Summary (Signed)
Discharge Summary    Patient ID: Shannon Cummings MRN: [id number]; DOB: 02-22-1946  Admit date: 05/13/2019 Discharge date: 05/14/2019  Primary Care Provider: [name], MD  Primary Cardiologist: [name], MD  Primary Electrophysiologist:  None   Discharge Diagnoses    Principal Problem:   NSTEMI (non-ST elevated myocardial infarction) [facility]) Active Problems:   Hyperlipidemia   Essential hypertension   COVID-19   Unstable angina [facility])    Diagnostic Studies/Procedures    Left Heart Catheterization 05/14/2019:  Non-stenotic Prox LAD lesion was previously treated.  Ost 1st Diag lesion is 70% stenosed.  Ost 1st Mrg lesion is 25% stenosed.  Previously placed 1st Mrg drug eluting stent is widely patent.  Balloon angioplasty was performed.  RPDA lesion is 50% stenosed.  The left ventricular ejection fraction is 45-50% by visual estimate.  LV end diastolic pressure is normal.   1.  Short left main with no stenosis 2.  Patent LAD with continued patency of the stented segment in the proximal vessel, moderate stenosis at the ostium of the diagonal branch unchanged from previous studies 3.  Continued patency of the circumflex and OM stent 4.  Patent RCA with no obstructive disease, moderate small vessel stenosis in the right PDA 5.  Mild segmental LV dysfunction with anterolateral hypokinesis, consistent with previous studies  Recommendations: No change in coronary anatomy from previous heart catheterization study in January 2020.  Continue medical therapy.  Anticipate discharge later today.  Will add low-dose amlodipine.  Resume apixaban tomorrow morning at normal dosing schedule.  Discontinue clopidogrel in January 2021 when she is 12 months out from last PCI procedure as previously planned.  _____________   History of Present Illness     Shannon Cummings is a 73 y.o. female with a hx of  CAD s/p DES to proximal LAD 08/14/12, NSTEMI [id number], chronic systolic  heart failure with EF of 40-45%, PAF on eliquis, HTN, and HLD, who presented with chest pain.   She was hospitalized Jan 2020 with NSTEMI. Heart cath revealed previously placed patent stent in her proximal LAD and 80% stenosis in the distal first marginal successfully treated with DES. She was placed on plavix monotherapy in the setting of eliquis. She has followed in the Afib clinic and was seen by EP 08/2018 for consideration of ablation. She reports having Afib since ?2010. She was previously treated with propafenone, but this was stopped in 06/2018 due to CAD. She was started on amiodarone as a possible bridge to ablation. She has been bradycardic with marginal pressures limiting rate control. She becomes very symptomatic with tachypalpitations when she is in Afib./ Amiodarone dose was reduced to 100 mg for SOB. Dr. Rayann Heman ordered pre-procedure testing for ablation, but it does not look like these were completed. She has not been seen since her March appt. She does not tolerate CPAP for her OSA. HTN is managed with 50 mg losartan, 10 mg propranolol, and spironolactone 12.5 mg daily.   She presented to [facility] 05/13/2019 with chest pain concerning for angina. She woke up with chest pain at approximately 1:30 AM with chest pressure that radiated into her shoulders. Pain was rated as a 8/10, associated with SOB. She states this pain feels like her prior MI's. After nitro, her CP has been resolved.   She reports that her husband tested positive for COVID-19 one month ago and she was presumed positive, but never tested. She reports approximately one week of symptoms at that time. She has tested positive by PCR  in the ER.   EKG with sinus rhythm, no signs of ischemia. HS troponin x 2 negative. CTA negative for PE. Heparin gtt, morphine, and nitropaste ordered.   Cardiology consulted for admission. She was chest pain free at the time of admission.   Hospital Course     Consultants: None   1. NSTEMI in patient  with known CAD s/p PCI/DES to LAD and OM1: patient presented with SSCP relieved with nitroglycerin. HsTrop peaked at 27. EKG non-ischemic. She underwent LHC 05/14/2019 which showed no changes in her coronary anatomy. She was started on amlodipine 2.5mg  daily for medical management and continue plavix through 06/2019. She was instructed to resume her apixaban on the morning of 05/15/2019.  - Continue plavix and statin - Continue amlodipine  2. COVID 19: tested positive this admission. Reported her husband tested positive 1 mo ago, at which time she was also symptomatic but did not get tested. ?active infection vs lingering positive results.  - Encouraged continued masking and social distancing to minimize spread.   3. Paroxysmal atrial fibrillation: maintained sinus rhythm this admission on home amiodarone. Apixaban held for [facility] with instructions to resume on the morning of 05/15/2019.  - Continue amiodarone for rhythm control - Continue apixaban for stroke ppx.   4. HTN: BP stable this admission - Continue home losartan - Will start amlodipine 2.5mg  daily at discharge  5. HLD: - Continue home atorvastatin   Did the patient have an acute coronary syndrome (MI, NSTEMI, STEMI, etc) this admission?:  Yes                               AHA/ACC Clinical Performance & Quality Measures: 1. Aspirin prescribed? - No - on apixaban 2. ADP Receptor Inhibitor (Plavix/Clopidogrel, Brilinta/Ticagrelor or Effient/Prasugrel) prescribed (includes medically managed patients)? - Yes 3. Beta Blocker prescribed? - Yes 4. High Intensity Statin (Lipitor 40-80mg  or Crestor 20-40mg ) prescribed? - Yes 5. EF assessed during THIS hospitalization? - Yes 6. For EF <40%, was ACEI/ARB prescribed? - Not Applicable (EF >/= [id number]) 7. For EF <40%, Aldosterone Antagonist (Spironolactone or Eplerenone) prescribed? - Not Applicable (EF >/= [id number]) 8. Cardiac Rehab Phase II ordered (Included Medically managed Patients)? - Yes    _____________  Discharge Vitals Blood pressure 117/66, pulse (!) 55, temperature (!) 97.5 F (36.4 C), temperature source Oral, resp. rate 12, height 5\' 1"  (1.549 m), weight 62.1 kg, SpO2 97 %.  Filed Weights   05/13/19 0344 05/14/19 0705  Weight: 60.9 kg 62.1 kg    Labs & Radiologic Studies    CBC Recent Labs    05/13/19 0330 05/14/19 0732  WBC 6.0 5.6  HGB 13.7 14.1  HCT 40.2 42.0  MCV 92.2 94.2  PLT 200 [id number]   Basic Metabolic Panel Recent Labs    05/13/19 0330 05/14/19 0732  NA 136 138  K 3.9 3.7  CL 103 103  CO2 22 25  GLUCOSE 117* 94  BUN 12 8  CREATININE 1.14* 1.03*  CALCIUM 8.7* 8.2*   Liver Function Tests No results for input(s): AST, ALT, ALKPHOS, BILITOT, PROT, ALBUMIN in the last 72 hours. No results for input(s): LIPASE, AMYLASE in the last 72 hours. High Sensitivity Troponin:   Recent Labs  Lab 05/13/19 0330 05/13/19 0530 05/13/19 1359 05/13/19 2104 05/13/19 2306  TROPONINIHS 7 17 26* 27* 25*    BNP Invalid input(s): POCBNP D-Dimer No results for input(s): DDIMER in the last 72 hours. Hemoglobin  A1C No results for input(s): HGBA1C in the last 72 hours. Fasting Lipid Panel Recent Labs    05/14/19 0730  CHOL 200  HDL 63  LDLCALC 116*  TRIG 105  CHOLHDL 3.2   Thyroid Function Tests No results for input(s): TSH, T4TOTAL, T3FREE, THYROIDAB in the last 72 hours.  Invalid input(s): FREET3 _____________  Ct Angio Chest Pe W And/or Wo Contrast  Result Date: 05/13/2019 CLINICAL DATA:  Complex chest pain EXAM: CT ANGIOGRAPHY CHEST WITH CONTRAST TECHNIQUE: Multidetector CT imaging of the chest was performed using the standard protocol during bolus administration of intravenous contrast. Multiplanar CT image reconstructions and MIPs were obtained to evaluate the vascular anatomy. CONTRAST:  56mL OMNIPAQUE IOHEXOL 350 MG/ML SOLN COMPARISON:  01/05/2017 FINDINGS: Cardiovascular: Normal heart size. No significant pericardial effusion. Lad coronary  stenting. Essentially non-opacified aorta. No pulmonary artery filling defect Mediastinum/Nodes: Negative for adenopathy or mass Lungs/Pleura: Mild dependent atelectasis. There is no edema, consolidation, effusion, or pneumothorax. Upper Abdomen: Negative Musculoskeletal: No acute finding. Chronic Schmorl's node at the T11 superior endplate Review of the MIP images confirms the above findings. IMPRESSION: 1. Negative for pulmonary embolism. 2. Dependent atelectasis. Electronically Signed   By: Monte Fantasia M.D.   On: 05/13/2019 07:20   Dg Chest Portable 1 View  Result Date: 05/13/2019 CLINICAL DATA:  Awoke from sleep with chest pain. EXAM: PORTABLE CHEST 1 VIEW COMPARISON:  Radiograph 06/17/2018 FINDINGS: Mild chronic elevation of right hemidiaphragm.The cardiomediastinal contours are normal. The lungs are clear. Pulmonary vasculature is normal. No consolidation, pleural effusion, or pneumothorax. No acute osseous abnormalities are seen. IMPRESSION: No acute chest finding. Electronically Signed   By: Keith Rake M.D.   On: 05/13/2019 03:49   Disposition   Pt is being discharged home today in good condition.  Follow-up Plans & Appointments    Follow-up Information    Liliane Shi, PA-C Follow up on 06/03/2019.   Specialties: Cardiology, Physician Assistant Why: Please arrive 15 minutes early for your 10:15 apm post-hospital cardiology follow-up appointment Contact information: [address] [place] [address] [phone]          Discharge Instructions    MyChart COVID-19 home monitoring program   Complete by: May 14, 2019    Is the patient willing to use the [place] for home monitoring?: Yes   Temperature monitoring   Complete by: May 14, 2019    After how many days would you like to receive a notification of this patient's flowsheet entries?: 1      Discharge Medications   Allergies as of 05/14/2019      Reactions   Flomax  [tamsulosin] Other (See Comments)   Caused rapid HYPOTENSION- patient came very close to fainting (also caused weakness and vertigo)   Isosorbide    Headache   Other Anxiety, Other (See Comments)   Intolerance to strong pain medications=  Make her feel anxious and feel like coming out of her skin   Effexor [venlafaxine] Other (See Comments)   CAUSED TOO MUCH SEDATION; CANNOT TOLERATE   Statins Other (See Comments)   Restless legs, bad feeling.  This has occurred with Crestor 5 mg once weekly, Lipitor 10 mg twice weekly, and Simvastatin daily   Zetia [ezetimibe] Other (See Comments)   Made the legs ache      Medication List    TAKE these medications   acetaminophen 650 MG CR tablet Commonly known as: TYLENOL Take 650 mg by mouth every 8 (eight)  hours as needed for pain.   Alirocumab 75 MG/ML Soaj Commonly known as: Praluent Inject 75 mg into the skin every 14 (fourteen) days.   AMBULATORY NON FORMULARY MEDICATION Take 180 mg by mouth daily. Medication Name: bempedoic acid 180 mg vs a placebo.  CLEAR Research Study, drug provided   amiodarone 200 MG tablet Commonly known as: PACERONE Take 0.5 tablets (100 mg total) by mouth daily.   amLODipine 2.5 MG tablet Commonly known as: NORVASC Take 1 tablet (2.5 mg total) by mouth daily.   B-12 1000 MCG Lozg Take 1,000 mcg by mouth daily.   clopidogrel 75 MG tablet Commonly known as: PLAVIX Take 1 tablet (75 mg total) by mouth daily.   Eliquis 5 MG Tabs tablet Generic drug: apixaban TAKE 1 TABLET(5 MG) BY MOUTH TWICE DAILY What changed: See the new instructions.   fluticasone 50 MCG/ACT nasal spray Commonly known as: FLONASE Place 2 sprays into both nostrils daily as needed for allergies or rhinitis.   gabapentin 300 MG capsule Commonly known as: Neurontin Take 1 capsule (300 mg total) by mouth 3 (three) times daily. What changed:   when to take this  reasons to take this   icosapent Ethyl 1 g capsule Commonly known  as: Vascepa Take 2 capsules (2 g total) by mouth 2 (two) times daily.   losartan 25 MG tablet Commonly known as: COZAAR Take 2 tablets (50 mg total) by mouth daily.   nitroGLYCERIN 0.4 MG SL tablet Commonly known as: NITROSTAT Place 1 tablet (0.4 mg total) under the tongue every 5 (five) minutes x 3 doses as needed for chest pain.   pantoprazole 40 MG tablet Commonly known as: PROTONIX TAKE 1 TABLET(40 MG) BY MOUTH DAILY What changed:   how much to take  how to take this  when to take this  additional instructions   potassium chloride 10 MEQ tablet Commonly known as: KLOR-CON TAKE 1 TABLET(10 MEQ) BY MOUTH DAILY What changed: See the new instructions.   propranolol 10 MG tablet Commonly known as: INDERAL Take 1 tablet (10 mg total) by mouth 4 (four) times daily as needed. What changed: reasons to take this   spironolactone 25 MG tablet Commonly known as: ALDACTONE Take 0.5 tablets (12.5 mg total) by mouth daily. What changed:   how much to take  when to take this   Vitamin D3 25 MCG (1000 UT) Caps Take 1,000 Units by mouth daily.          Outstanding Labs/Studies   None  Duration of Discharge Encounter   Greater than 30 minutes including physician time.  Signed, Abigail Butts, PA-C 05/14/2019, 4:32 PM

## 2019-05-14 NOTE — Progress Notes (Signed)
[place] for Heparin Indication: chest pain/ACS  Allergies  Allergen Reactions  . Flomax [Tamsulosin] Other (See Comments)    Caused rapid HYPOTENSION- patient came very close to fainting (also caused weakness and vertigo)  . Isosorbide     Headache  . Other Anxiety and Other (See Comments)    Intolerance to strong pain medications=  Make her feel anxious and feel like coming out of her skin  . Effexor [Venlafaxine] Other (See Comments)    CAUSED TOO MUCH SEDATION; CANNOT TOLERATE  . Statins Other (See Comments)    Restless legs, bad feeling.  This has occurred with Crestor 5 mg once weekly, Lipitor 10 mg twice weekly, and Simvastatin daily  . Zetia [Ezetimibe] Other (See Comments)    Made the legs ache    Patient Measurements: Height: 5\' 1"  (154.9 cm) Weight: 137 lb (62.1 kg) IBW/kg (Calculated) : 47.8  Vital Signs: Temp: 97.5 F (36.4 C) (12/09 0705) Temp Source: Oral (12/09 0705) BP: 123/71 (12/09 0705) Pulse Rate: 54 (12/09 0705)  Labs: Recent Labs    05/13/19 0330  05/13/19 1359 05/13/19 2104 05/13/19 2306 05/14/19 0732  HGB 13.7  --   --   --   --  14.1  HCT 40.2  --   --   --   --  42.0  PLT 200  --   --   --   --  183  APTT  --   --   --   --  44* 79*  HEPARINUNFRC  --   --   --   --  0.83* 0.89*  CREATININE 1.14*  --   --   --   --  1.03*  TROPONINIHS 7   < > 26* 27* 25*  --    < > = values in this interval not displayed.    Estimated Creatinine Clearance: 41.7 mL/min (A) (by C-G formula based on SCr of 1.03 mg/dL (H)).   Medical History: Past Medical History:  Diagnosis Date  . Arthritis   . CAD (coronary artery disease)    a. prior stenting history. b. NSTEMI s/p DES to prox LAD with EF 45% by cath 08/14/12. c. Mild trop elevation shortly after cath ?mild plaque embolization - patent stent on relook. // d. Myoview 8/18: EF 60, normal perfusion; Low Risk. e. NSTEMI 06/2018- patent prior LAD stent, 80% OM1 s/p  DES, normal LVEDP, EF 45-50%, moderate residual disease treated medically.  . Fatty infiltration of liver   . GERD (gastroesophageal reflux disease)   . Hyperlipidemia   . Hypertension   . Hypokalemia   . Hypotension   . Myocardial infarction ([place]) 2014  . OSA on CPAP   . Osteoporosis   . PAF (paroxysmal atrial fibrillation) ([place])    On rythmol previously  . Pulmonary nodule    a. 41mm subpleural nodular density CT 08/2012, instructed to f/u pulm MD.  . Sinus bradycardia   . Tubular adenoma of colon 2007    Medications:  No current facility-administered medications on file prior to encounter.    Current Outpatient Medications on File Prior to Encounter  Medication Sig Dispense Refill  . acetaminophen (TYLENOL) 650 MG CR tablet Take 650 mg by mouth every 8 (eight) hours as needed for pain.    . Alirocumab (PRALUENT) 75 MG/ML SOAJ Inject 75 mg into the skin every 14 (fourteen) days. 2 pen 11  . amiodarone (PACERONE) 200 MG tablet Take 0.5 tablets (100 mg total)  by mouth daily. 45 tablet 3  . Cholecalciferol (VITAMIN D3) 25 MCG (1000 UT) CAPS Take 1,000 Units by mouth daily.    . clopidogrel (PLAVIX) 75 MG tablet Take 1 tablet (75 mg total) by mouth daily. 30 tablet 11  . Cyanocobalamin (B-12) 1000 MCG LOZG Take 1,000 mcg by mouth daily.     Marland Kitchen ELIQUIS 5 MG TABS tablet TAKE 1 TABLET(5 MG) BY MOUTH TWICE DAILY (Patient taking differently: Take 5 mg by mouth 2 (two) times daily. ) 180 tablet 1  . fluticasone (FLONASE) 50 MCG/ACT nasal spray Place 2 sprays into both nostrils daily as needed for allergies or rhinitis.    Marland Kitchen gabapentin (NEURONTIN) 300 MG capsule Take 1 capsule (300 mg total) by mouth 3 (three) times daily. (Patient taking differently: Take 300 mg by mouth 3 (three) times daily as needed (for pain). ) 21 capsule 0  . losartan (COZAAR) 25 MG tablet Take 2 tablets (50 mg total) by mouth daily. 60 tablet 11  . nitroGLYCERIN (NITROSTAT) 0.4 MG SL tablet Place 1 tablet (0.4 mg total)  under the tongue every 5 (five) minutes as needed for chest pain (up to 3 doses). (Patient taking differently: Place 0.4 mg under the tongue every 5 (five) minutes x 3 doses as needed for chest pain. ) 25 tablet 6  . pantoprazole (PROTONIX) 40 MG tablet TAKE 1 TABLET(40 MG) BY MOUTH DAILY (Patient taking differently: Take 40 mg by mouth daily before breakfast. ) 90 tablet 0  . potassium chloride (K-DUR) 10 MEQ tablet TAKE 1 TABLET(10 MEQ) BY MOUTH DAILY (Patient taking differently: Take 10 mEq by mouth daily. ) 90 tablet 2  . propranolol (INDERAL) 10 MG tablet Take 1 tablet (10 mg total) by mouth 4 (four) times daily as needed. (Patient taking differently: Take 10 mg by mouth 4 (four) times daily as needed ("for A-Fib attacks"). ) 60 tablet 11  . spironolactone (ALDACTONE) 25 MG tablet Take 0.5 tablets (12.5 mg total) by mouth daily. (Patient taking differently: Take 25 mg by mouth every other day. ) 45 tablet 3  . AMBULATORY NON FORMULARY MEDICATION Take 180 mg by mouth daily. Medication Name: bempedoic acid 180 mg vs a placebo.  CLEAR Research Study, drug provided    . Icosapent Ethyl (VASCEPA) 1 g CAPS Take 2 capsules (2 g total) by mouth 2 (two) times daily. (Patient not taking: Reported on 05/13/2019) 120 capsule 11     Assessment: 73 y.o. female with chest pain for heparin -apixaban PTA for hx Afib (LD PTA 12/7 PM).  Heparin level came back falsely elevated at 0.89, aPTT is therapeutic at 79, on 900 units/hr. Hgb 14.1, plt 183. No s/sx of bleeding or infusion issues.   Goal of Therapy:  Heparin level 0.3-0.7 units/ml Monitor platelets by anticoagulation protocol: Yes   Plan:  Continue heparin infusion at 900 units/hr Confirm heparin level in 6 hours or f/u after cath Monitor daily HL and aPTT (until correlate then HL), CBC, and for s/sx of bleeding F/u [facility] plans post-cath  Antonietta Jewel, PharmD, BCCCP Clinical Pharmacist  Phone: [phone]  Please check AMION for all [place] phone  numbers After 10:00 PM, call [place] [phone] 05/14/2019,8:43 AM

## 2019-05-14 NOTE — Progress Notes (Addendum)
Progress Note  Patient Name: Shannon Cummings Date of Encounter: 05/14/2019  Primary Cardiologist: [name], MD   Subjective   No further chest pain or shortness of breath  Inpatient Medications    Scheduled Meds: . amiodarone  100 mg Oral Daily  . aspirin EC  81 mg Oral Daily  . clopidogrel  75 mg Oral Daily  . icosapent Ethyl  2 g Oral BID  . losartan  50 mg Oral Daily  . pantoprazole  40 mg Oral QAC breakfast  . sodium chloride flush  3 mL Intravenous Q12H   Continuous Infusions: . sodium chloride    . sodium chloride 1 mL/kg/hr (05/14/19 0512)  . heparin 900 Units/hr (05/14/19 0016)  . nitroGLYCERIN Stopped (05/13/19 1359)   PRN Meds: sodium chloride, acetaminophen, nitroGLYCERIN, ondansetron (ZOFRAN) IV, sodium chloride flush   Vital Signs    Vitals:   05/13/19 1930 05/13/19 2030 05/13/19 2100 05/14/19 0705  BP: (!) 168/88 (!) 165/96 (!) 155/88 123/71  Pulse: (!) 56 (!) 57 (!) 58 (!) 54  Resp: 12 12 18 18   Temp:   (!) 97.5 F (36.4 C) (!) 97.5 F (36.4 C)  TempSrc:   Oral Oral  SpO2: 98% 97% 100% 95%  Weight:    62.1 kg  Height:        Intake/Output Summary (Last 24 hours) at 05/14/2019 0720 Last data filed at 05/14/2019 0300 Gross per 24 hour  Intake 1178.96 ml  Output -  Net 1178.96 ml   Last 3 Weights 05/14/2019 05/13/2019 04/28/2019  Weight (lbs) 137 lb 134 lb 4.2 oz 137 lb 6 oz  Weight (kg) 62.143 kg 60.9 kg 62.313 kg      Telemetry    Normal sinus rhythm without significant arrhythmia- Personally Reviewed   Physical Exam  Alert, oriented, in no distress GEN: No acute distress.   Neck: No JVD Cardiac: RRR, no murmurs Respiratory: Clear to auscultation bilaterally. GI: Soft, nontender, non-distended  MS: No edema; No deformity. Neuro:  Nonfocal  Psych: Normal affect   Labs    High Sensitivity Troponin:   Recent Labs  Lab 05/13/19 0330 05/13/19 0530 05/13/19 1359 05/13/19 2104 05/13/19 2306  TROPONINIHS 7 17 26* 27* 25*       Chemistry Recent Labs  Lab 05/13/19 0330  NA 136  K 3.9  CL 103  CO2 22  GLUCOSE 117*  BUN 12  CREATININE 1.14*  CALCIUM 8.7*  GFRNONAA 48*  GFRAA 56*  ANIONGAP 11     Hematology Recent Labs  Lab 05/13/19 0330  WBC 6.0  RBC 4.36  HGB 13.7  HCT 40.2  MCV 92.2  MCH 31.4  MCHC 34.1  RDW 13.2  PLT 200    BNPNo results for input(s): BNP, PROBNP in the last 168 hours.   DDimer No results for input(s): DDIMER in the last 168 hours.   Radiology    Ct Angio Chest Pe W And/or Wo Contrast  Result Date: 05/13/2019 CLINICAL DATA:  Complex chest pain EXAM: CT ANGIOGRAPHY CHEST WITH CONTRAST TECHNIQUE: Multidetector CT imaging of the chest was performed using the standard protocol during bolus administration of intravenous contrast. Multiplanar CT image reconstructions and MIPs were obtained to evaluate the vascular anatomy. CONTRAST:  43mL OMNIPAQUE IOHEXOL 350 MG/ML SOLN COMPARISON:  01/05/2017 FINDINGS: Cardiovascular: Normal heart size. No significant pericardial effusion. Lad coronary stenting. Essentially non-opacified aorta. No pulmonary artery filling defect Mediastinum/Nodes: Negative for adenopathy or mass Lungs/Pleura: Mild dependent atelectasis. There is no edema, consolidation,  effusion, or pneumothorax. Upper Abdomen: Negative Musculoskeletal: No acute finding. Chronic Schmorl's node at the T11 superior endplate Review of the MIP images confirms the above findings. IMPRESSION: 1. Negative for pulmonary embolism. 2. Dependent atelectasis. Electronically Signed   By: Monte Fantasia M.D.   On: 05/13/2019 07:20   Dg Chest Portable 1 View  Result Date: 05/13/2019 CLINICAL DATA:  Awoke from sleep with chest pain. EXAM: PORTABLE CHEST 1 VIEW COMPARISON:  Radiograph 06/17/2018 FINDINGS: Mild chronic elevation of right hemidiaphragm.The cardiomediastinal contours are normal. The lungs are clear. Pulmonary vasculature is normal. No consolidation, pleural effusion, or  pneumothorax. No acute osseous abnormalities are seen. IMPRESSION: No acute chest finding. Electronically Signed   By: Keith Rake M.D.   On: 05/13/2019 03:49    Cardiac Studies   Pending  Patient Profile     73 y.o. female with known CAD, PAF on chronic oral anticoagulation, presents with NSTEMI  Assessment & Plan    1. NSTEMI: mildly elevated HS-troponin noted. Pt with several hours of SSCP relieved with heparin/NTG gtts with background of known CAD. Pt with prior LAD stenting and then OM stenting in January 2020. Cath films reviewed. Now presenting with her typical symptoms of angina, stating this feels exactly like her prior angina associated with mildly elevated troponin. After review of diagnostic/treatment options, I've recommended cardiac cath and possible PCI. I have reviewed the risks, indications, and alternatives to cardiac catheterization, possible angioplasty, and stenting with the patient. Risks include but are not limited to bleeding, infection, vascular injury, stroke, myocardial infection, arrhythmia, kidney injury, radiation-related injury in the case of prolonged fluoroscopy use, emergency cardiac surgery, and death. The patient understands the risks of serious complication is 1-2 in [id number] with diagnostic cardiac cath and 1-2% or less with angioplasty/stenting.   2. PAF: maintaining sinus rhythm on amiodarone, apixaban on hold for cath.  3. Covid-19 positive. Pt had exposure to Covid 19 one month ago when her husband was sick and tested positive. She had clinical sx's at the same time but didn't get tested. She now presents with ACS and her Covid-19 test is positive. She has no fever, CXR abnormality, or other clinical sx's associated with Covid-19. Possible that her ACS presentation is related to post-Covid inflammation. Continue appropriate Covid airborne/contact precautions.   4. HTN: BP controlled this am on losartan.  Dispo: pending cardiac cath findings today  For  questions or updates, please contact [facility] Please consult [email or web address] for contact info under     Signed, [name], MD  05/14/2019, 7:20 AM    Addendum: Cardiac catheterization completed with no significant change in coronary anatomy.  We will add amlodipine 2.5 mg to her medical regimen.  Resume apixaban tomorrow morning at her normal dosing schedule.  Continue Plavix through January 2021 as previously planned.  Recommend follow-up with Dr. Acie Fredrickson in 3 weeks.  Patient stable for discharge home today.  Discussed plan with the patient and her husband at length.  [name] .05/14/2019 10:32 AM

## 2019-05-14 NOTE — Discharge Instructions (Signed)
PLEASE REMEMBER TO BRING ALL OF YOUR MEDICATIONS TO EACH OF YOUR FOLLOW-UP OFFICE VISITS.  PLEASE ATTEND ALL SCHEDULED FOLLOW-UP APPOINTMENTS.   Activity: Increase activity slowly as tolerated. You may shower, but no soaking baths (or swimming) for 1 week. No driving for 24 hours. No lifting over 5 lbs for 1 week. No sexual activity for 1 week.   You May Return to Work: in 1 week (if applicable)  Wound Care: You may wash cath site gently with soap and water. Keep cath site clean and dry. If you notice pain, swelling, bleeding or pus at your cath site, please call [phone].  You can restart your apixaban tomorrow morning (05/15/2019)

## 2019-05-14 NOTE — Care Management CC44 (Signed)
Condition Code 44 Documentation Completed  Patient Details  Name: Falana Lane MRN: [id number] Date of Birth: 06-Feb-1946   Condition Code 44 given:  Yes Patient signature on Condition Code 44 notice:  Yes Documentation of 2 MD's agreement:  Yes Code 44 added to claim:  Yes    Bethena Roys, RN 05/14/2019, 3:48 PM

## 2019-05-20 ENCOUNTER — Telehealth (HOSPITAL_COMMUNITY): Payer: Self-pay | Admitting: Nurse Practitioner

## 2019-05-20 NOTE — Telephone Encounter (Signed)
Called and left message for patient to call back.  Per staff message from Dr. Rayann Heman, pt needs f/u in February, 2021 with Roderic Palau, NP.

## 2019-05-23 ENCOUNTER — Telehealth: Payer: Self-pay | Admitting: Family Medicine

## 2019-05-23 NOTE — Telephone Encounter (Signed)
Called and informed pt. She stated an understanding.

## 2019-05-23 NOTE — Telephone Encounter (Signed)
Pt called in stating that she tested positive for COVID again on 12/08, it is her understanding that she can test positive for up to 90days. She wanted to confirm this with Dr.Tabori. She is having family members come in for Christmas and wanted to know if this would be ok or should she cancel with them. Please advise and pt can be reached at the home #

## 2019-05-23 NOTE — Telephone Encounter (Signed)
A COVID test can remain + for up to 90 days.  If she is at all symptomatic, she should cancel plans with family.  Even if 12/8 was her original testing date, the 22nd would be a 2 week quarantine- as long as she is asymptomatic.

## 2019-05-23 NOTE — Telephone Encounter (Signed)
Please advise 

## 2019-06-02 NOTE — Progress Notes (Signed)
Cardiology Office Note:    Date:  06/03/2019   ID:  [name], DOB Apr 13, 1946, MRN [id number]  PCP:  [name], MD  Cardiologist:  [name], MD  Electrophysiologist:  Thompson Grayer, MD   Referring MD: [name], MD   Chief Complaint  Patient presents with  . Hospitalization Follow-up    s/p MI; med Rx    History of Present Illness:    Shannon Cummings is a 73 y.o. female with:   Coronary artery disease   s/p prior PCI to LAD  S/p NSTEMI in 2014 in setting of critical LAD ISR >> s/p DES to LAD  S/p NSTEMI 06/2018 >> PCI: DES to OM1  Persistent atrial fibrillation   Previously tx with Propafenone >> DCd 06/2018  Amiodarone Rx  Saw Dr. Rayann Heman in 08/2018 and is considering PVI ablation   Combined Systolic and Diastolic CHF  Hypertension   Hyperlipidemia   OSA on CPAP   Ms. Syslo was admitted 12/8-12/9 with a NSTEMI.  Cardiac catheterization demonstrated a patent stent in the LAD and OM1.  There was moderate disease in the D1 and RPDA.  Med Rx was continued and she was started on Amlodipine.    She returns for follow-up.  Since discharge, she has been doing well.  She did have an episode of chest discomfort yesterday that was relieved promptly with nitroglycerin.  She has not had any other episodes.  She has not had significant shortness of breath.  She has not had syncope, bleeding issues.    Prior CV studies:   The following studies were reviewed today:  Cardiac catheterization 05/14/2019 LAD prox stent patent; D1 ost 53 OM1 ost 25, prox stent patent RPDA 50 EF 45-50   Chest CTA 05/13/2019 IMPRESSION: 1. Negative for pulmonary embolism. 2. Dependent atelectasis.  Cardiac catheterization 06/14/2018 LAD prox stent patent; D1 ost 25 LCx ost 25; OM1 80 RPDA 50 EF 45-50 PCI: 2.5 x 12 mm Synergy DES to OM1  Echocardiogram 06/14/2018 EF 40-45, ant-lat and inf-lat HK, Gr 1 DD, L pleural effusion  Myoview 01/10/2017  Nuclear  stress EF: 60%.  There was no ST segment deviation noted during stress.  The study is normal.  This is a low risk study.  The left ventricular ejection fraction is normal (55-65%).  Chest CTA 01/05/17 IMPRESSION: No evidence of thoracic or abdominal aortic aneurysm or dissection. No acute abnormality is noted in the chest, abdomen or pelvis.  LHC 3/14 LAD proximal stent with 99 ISR; D1 40 LCx patent RCA patent EF 45 PCI: 3 x 20 mm Promus Premier DES To the proximal LAD  Echo 3/14 EF 45-50, anteroseptal and apical hypokinesis  Past Medical History:  Diagnosis Date  . Arthritis   . CAD (coronary artery disease)    a. prior stenting history. b. NSTEMI s/p DES to prox LAD with EF 45% by cath 08/14/12. c. Mild trop elevation shortly after cath ?mild plaque embolization - patent stent on relook. // d. Myoview 8/18: EF 60, normal perfusion; Low Risk. e. NSTEMI 06/2018- patent prior LAD stent, 80% OM1 s/p DES, normal LVEDP, EF 45-50%, moderate residual disease treated medically.  . Fatty infiltration of liver   . GERD (gastroesophageal reflux disease)   . Hyperlipidemia   . Hypertension   . Hypokalemia   . Hypotension   . Myocardial infarction ([place]) 2014  . OSA on CPAP   . Osteoporosis   . PAF (paroxysmal atrial fibrillation) ([place])    On rythmol  previously  . Pulmonary nodule    a. 33mm subpleural nodular density CT 08/2012, instructed to f/u pulm MD.  . Sinus bradycardia   . Tubular adenoma of colon 2007   Surgical Hx: The patient  has a past surgical history that includes Total abdominal hysterectomy; Tonsillectomy; Coronary stent placement (2000); Coronary angioplasty with stent (08/14/2012); Rotator cuff repair; left heart catheterization with coronary angiogram (N/A, 08/14/2012); left heart catheterization with coronary angiogram (N/A, 08/19/2012); hair restoration (02/2017); LEFT HEART CATH AND CORONARY ANGIOGRAPHY (N/A, 06/14/2018); CORONARY STENT INTERVENTION (N/A, 06/14/2018);  and LEFT HEART CATH AND CORONARY ANGIOGRAPHY (N/A, 05/14/2019).   Current Medications: Current Meds  Medication Sig  . acetaminophen (TYLENOL) 650 MG CR tablet Take 650 mg by mouth every 8 (eight) hours as needed for pain.  . Alirocumab (PRALUENT) 75 MG/ML SOAJ Inject 75 mg into the skin every 14 (fourteen) days.  . AMBULATORY NON FORMULARY MEDICATION Take 180 mg by mouth daily. Medication Name: bempedoic acid 180 mg vs a placebo.  CLEAR Research Study, drug provided  . amLODipine (NORVASC) 2.5 MG tablet Take 1 tablet (2.5 mg total) by mouth daily.  . Cholecalciferol (VITAMIN D3) 25 MCG (1000 UT) CAPS Take 1,000 Units by mouth daily.  . clopidogrel (PLAVIX) 75 MG tablet Take 1 tablet (75 mg total) by mouth daily.  . Cyanocobalamin (B-12) 1000 MCG LOZG Take 1,000 mcg by mouth daily.   Marland Kitchen ELIQUIS 5 MG TABS tablet TAKE 1 TABLET(5 MG) BY MOUTH TWICE DAILY (Patient taking differently: Take 5 mg by mouth 2 (two) times daily. )  . fluticasone (FLONASE) 50 MCG/ACT nasal spray Place 2 sprays into both nostrils daily as needed for allergies or rhinitis.  Marland Kitchen nitroGLYCERIN (NITROSTAT) 0.4 MG SL tablet Place 1 tablet (0.4 mg total) under the tongue every 5 (five) minutes x 3 doses as needed for chest pain.  . pantoprazole (PROTONIX) 40 MG tablet TAKE 1 TABLET(40 MG) BY MOUTH DAILY (Patient taking differently: Take 40 mg by mouth daily before breakfast. )  . potassium chloride (K-DUR) 10 MEQ tablet TAKE 1 TABLET(10 MEQ) BY MOUTH DAILY (Patient taking differently: Take 10 mEq by mouth daily. )  . propranolol (INDERAL) 10 MG tablet Take 1 tablet (10 mg total) by mouth 4 (four) times daily as needed. (Patient taking differently: Take 10 mg by mouth 4 (four) times daily as needed ("for A-Fib attacks"). )  . spironolactone (ALDACTONE) 25 MG tablet Take 0.5 tablets (12.5 mg total) by mouth daily. (Patient taking differently: Take 25 mg by mouth every other day. )  . [DISCONTINUED] amiodarone (PACERONE) 200 MG tablet  Take 0.5 tablets (100 mg total) by mouth daily.  . [DISCONTINUED] amiodarone (PACERONE) 200 MG tablet Take 0.5 tablets (100 mg total) by mouth daily.     Allergies:   Flomax [tamsulosin], Isosorbide, Other, Effexor [venlafaxine], Statins, and Zetia [ezetimibe]   Social History   Tobacco Use  . Smoking status: Never Smoker  . Smokeless tobacco: Never Used  Substance Use Topics  . Alcohol use: Yes    Comment: 2 drinks daily   . Drug use: No     Family Hx: The patient's family history includes Alzheimer's disease in her mother; Breast cancer in her maternal aunt; Heart disease in her father. There is no history of Colon cancer.  ROS:   Please see the history of present illness.    ROS All other systems reviewed and are negative.   EKGs/Labs/Other Test Reviewed:    EKG:  EKG is not ordered  today.    ECG performed on 05/14/2019 was personally reviewed and demonstrated sinus brady, HR 52, leftward axis, 1st degree AVB, PR 210 ms, no ST-TW changes, QTc 478  Recent Labs: 04/28/2019: ALT 29; TSH 4.70 05/14/2019: BUN 8; Creatinine, Ser 1.03; Hemoglobin 14.1; Platelets 183; Potassium 3.7; Sodium 138   Recent Lipid Panel Lab Results  Component Value Date/Time   CHOL 200 05/14/2019 07:30 AM   CHOL 210 (H) 11/14/2018 12:10 PM   TRIG 105 05/14/2019 07:30 AM   HDL 63 05/14/2019 07:30 AM   HDL 60 11/14/2018 12:10 PM   CHOLHDL 3.2 05/14/2019 07:30 AM   LDLCALC 116 (H) 05/14/2019 07:30 AM   LDLCALC 119 (H) 11/14/2018 12:10 PM   LDLDIRECT 111.0 04/28/2019 11:38 AM    Physical Exam:    VS:  BP 112/68   Pulse 63   Ht 5' 0.5" (1.537 m)   Wt 137 lb 6.4 oz (62.3 kg)   SpO2 99%   BMI 26.39 kg/m     Wt Readings from Last 3 Encounters:  06/03/19 137 lb 6.4 oz (62.3 kg)  05/14/19 137 lb (62.1 kg)  04/28/19 137 lb 6 oz (62.3 kg)     Physical Exam  Constitutional: She is oriented to person, place, and time. She appears well-developed and well-nourished. No distress.  HENT:  Head:  Normocephalic and atraumatic.  Eyes: No scleral icterus.  Neck: No JVD present. No thyromegaly present.  Cardiovascular: Normal rate, regular rhythm and normal heart sounds.  No murmur heard. Pulmonary/Chest: Effort normal and breath sounds normal. She has no rales.  Abdominal: Soft. There is no hepatomegaly.  Musculoskeletal:        General: No edema.     Comments: R wrist without hematoma  Lymphadenopathy:    She has no cervical adenopathy.  Neurological: She is alert and oriented to person, place, and time.  Skin: Skin is warm and dry.  Psychiatric: She has a normal mood and affect.    ASSESSMENT & PLAN:    1. NSTEMI (non-ST elevation myocardial infarction) ([place]) History of prior PCI to the LAD and subsequent myocardial infarction in 2014 secondary to in-stent restenosis which was treated with a drug-eluting stent to the LAD.  She had a recent myocardial infarction in January 2020 treated with a drug-eluting stent to the OM1.  She is now seen for posthospitalization follow-up after recent non-ST elevation myocardial infarction.  Cardiac catheterization demonstrated patent stents in the LAD and OM.  She has residual moderate disease elsewhere.  Medical therapy has been continued.  She has had 1 further episode of angina since discharge.  It resolved promptly with nitroglycerin.  I have recommended continuing her current medical therapy.  Since she had a recent myocardial infarction, we should keep her on antiplatelet therapy in addition to her Apixaban.  Continue amlodipine.  If she has recurrent symptoms or a changing pattern, we can consider increasing her dose of amlodipine.  She is intolerant of statins.  Continue Alirocumab.  Follow-up with Dr. Acie Fredrickson in 3 months.  2. Paroxysmal atrial fibrillation (HCC) Maintaining sinus rhythm by exam.  She remains on amiodarone.  Recent TSH and ALT were normal.  She inquired whether or not she should continue to follow-up in the atrial fibrillation  clinic.  Unless she decides to proceed with ablation, she can continue to follow-up in our office.  I will renew her amiodarone.  3. Chronic combined systolic and diastolic CHF (congestive heart failure) (HCC) Recent EF 40-45%.  Volume appears  stable.  She is not on beta-blocker due to bradycardia.  Continue ARB, spironolactone.  4. Essential hypertension The patient's blood pressure is controlled on her current regimen.  Continue current therapy.   5. Pure hypercholesterolemia Continue Alirocumab.  Recent LDL 116.  I will follow up with her lipid clinic to see if any adjustments can be made in her therapy.   Dispo:  Return in about 3 months (around 09/01/2019) for Routine Follow Up, w/ Dr. Acie Fredrickson, in person.   Medication Adjustments/Labs and Tests Ordered: Current medicines are reviewed at length with the patient today.  Concerns regarding medicines are outlined above.  Tests Ordered: No orders of the defined types were placed in this encounter.  Medication Changes: Meds ordered this encounter  Medications  . DISCONTD: amiodarone (PACERONE) 200 MG tablet    Sig: Take 0.5 tablets (100 mg total) by mouth daily.    Dispense:  45 tablet    Refill:  3  . amiodarone (PACERONE) 100 MG tablet    Sig: Take 1 tablet (100 mg total) by mouth daily.    Dispense:  90 tablet    Refill:  3    PLEASE DISREGARD THE RX FOR 200 MG TABLETS AND CALL PT WHEN THIS ONE IS READY    Signed, Richardson Dopp, PA-C  06/03/2019 11:06 AM    [facility] [address], [place], [place]  [address] Phone: [phone]; Fax: [phone]

## 2019-06-03 ENCOUNTER — Ambulatory Visit (INDEPENDENT_AMBULATORY_CARE_PROVIDER_SITE_OTHER): Payer: Medicare Other | Admitting: Physician Assistant

## 2019-06-03 ENCOUNTER — Telehealth: Payer: Self-pay | Admitting: Pharmacist

## 2019-06-03 ENCOUNTER — Encounter: Payer: Self-pay | Admitting: Physician Assistant

## 2019-06-03 ENCOUNTER — Other Ambulatory Visit: Payer: Self-pay

## 2019-06-03 VITALS — BP 112/68 | HR 63 | Ht 60.5 in | Wt 137.4 lb

## 2019-06-03 DIAGNOSIS — E7800 Pure hypercholesterolemia, unspecified: Secondary | ICD-10-CM

## 2019-06-03 DIAGNOSIS — I10 Essential (primary) hypertension: Secondary | ICD-10-CM

## 2019-06-03 DIAGNOSIS — I255 Ischemic cardiomyopathy: Secondary | ICD-10-CM | POA: Diagnosis not present

## 2019-06-03 DIAGNOSIS — I480 Paroxysmal atrial fibrillation: Secondary | ICD-10-CM

## 2019-06-03 DIAGNOSIS — I5042 Chronic combined systolic (congestive) and diastolic (congestive) heart failure: Secondary | ICD-10-CM | POA: Diagnosis not present

## 2019-06-03 DIAGNOSIS — I214 Non-ST elevation (NSTEMI) myocardial infarction: Secondary | ICD-10-CM

## 2019-06-03 MED ORDER — AMIODARONE HCL 100 MG PO TABS: 100.0000 mg | ORAL_TABLET | Freq: Every day | ORAL | 3 refills | Status: DC

## 2019-06-03 MED ORDER — NEXLETOL 180 MG PO TABS: 1.0000 | ORAL_TABLET | Freq: Every day | ORAL | 11 refills | Status: DC

## 2019-06-03 MED ORDER — AMIODARONE HCL 200 MG PO TABS: 100.0000 mg | ORAL_TABLET | Freq: Every day | ORAL | 3 refills | Status: DC

## 2019-06-03 NOTE — Patient Instructions (Addendum)
Medication Instructions:  Your physician recommends that you continue on your current medications as directed. Please refer to the Current Medication list given to you today.  *If you need a refill on your cardiac medications before your next appointment, please call your pharmacy*  Lab Work: None ordered  If you have labs (blood work) drawn today and your tests are completely normal, you will receive your results only by: Marland Kitchen MyChart Message (if you have MyChart) OR . A paper copy in the mail If you have any lab test that is abnormal or we need to change your treatment, we will call you to review the results.  Testing/Procedures: None ordered  Follow-Up: At [facility], you and your health needs are our priority.  As part of our continuing mission to provide you with exceptional heart care, we have created designated Provider Care Teams.  These Care Teams include your primary Cardiologist (physician) and Advanced Practice Providers (APPs -  Physician Assistants and Nurse Practitioners) who all work together to provide you with the care you need, when you need it.  Your next appointment:   09/01/2019 ARRIVE AT 11:30 FOR REGISTRATION  The format for your next appointment:   In Person  Provider:   You may see      Richardson Dopp, PA-C   Other Instructions

## 2019-06-03 NOTE — Telephone Encounter (Signed)
-----   Message from Liliane Shi, [place] sent at 06/03/2019 11:00 AM EST ----- Regarding: Lipids Evelena Masci Can you follow up with her on cholesterol? She has statin intol and is intol to Zetia.  She was in the CLEAR trial but was taken out after a recent MI.  She is in Praluent 75 mg.  Her LDL is 116.  I wanted to see if you thought increasing it would be possible.  She did say that she has some restless leg symptoms after her injection for a few days. Thanks [employer]

## 2019-06-03 NOTE — Telephone Encounter (Signed)
Nexletol prior authorization approved - copay is $17.44 per month. Left message for pt to make her aware. Will plan to reschedule lipid panel in 2-3 months when she returns call. She may still need dose increase of Praluent to target LDL goal < 70, will discuss with pt again at that time to see if she is more willing to increase her Praluent dose then.

## 2019-06-03 NOTE — Telephone Encounter (Signed)
Called pt to discuss increasing Praluent dose. She states she would rather try Nexletol first - she is in the CLEAR study but doesn't think she's receiving active medication. No insurance information on file - pt states insurance is as follows:  [employer] - ID: [id number] [id number] PCN: [id number]  Prior authorization for Nexletol has been submitted.

## 2019-06-10 ENCOUNTER — Ambulatory Visit: Payer: Medicare Other | Admitting: Physician Assistant

## 2019-06-10 NOTE — Telephone Encounter (Signed)
Spoke with pt - she will come fasting to appt with Richardson Dopp in March. Added orders to check lipid panel and CMET since starting Nexletol.

## 2019-06-12 DIAGNOSIS — M62838 Other muscle spasm: Secondary | ICD-10-CM | POA: Diagnosis not present

## 2019-06-12 DIAGNOSIS — R350 Frequency of micturition: Secondary | ICD-10-CM | POA: Diagnosis not present

## 2019-06-12 DIAGNOSIS — R3914 Feeling of incomplete bladder emptying: Secondary | ICD-10-CM | POA: Diagnosis not present

## 2019-06-12 DIAGNOSIS — M6281 Muscle weakness (generalized): Secondary | ICD-10-CM | POA: Diagnosis not present

## 2019-06-12 DIAGNOSIS — M6289 Other specified disorders of muscle: Secondary | ICD-10-CM | POA: Diagnosis not present

## 2019-06-14 ENCOUNTER — Other Ambulatory Visit: Payer: Self-pay | Admitting: Physician Assistant

## 2019-07-01 ENCOUNTER — Other Ambulatory Visit: Payer: Self-pay | Admitting: Cardiovascular Disease

## 2019-07-01 MED ORDER — SPIRONOLACTONE 25 MG PO TABS: 12.5000 mg | ORAL_TABLET | Freq: Every day | ORAL | 3 refills | Status: DC

## 2019-07-08 ENCOUNTER — Ambulatory Visit (INDEPENDENT_AMBULATORY_CARE_PROVIDER_SITE_OTHER): Payer: Medicare Other

## 2019-07-08 ENCOUNTER — Ambulatory Visit (INDEPENDENT_AMBULATORY_CARE_PROVIDER_SITE_OTHER): Payer: Medicare Other | Admitting: Orthopedic Surgery

## 2019-07-08 ENCOUNTER — Other Ambulatory Visit: Payer: Self-pay

## 2019-07-08 DIAGNOSIS — M79605 Pain in left leg: Secondary | ICD-10-CM

## 2019-07-10 ENCOUNTER — Other Ambulatory Visit: Payer: Self-pay

## 2019-07-10 MED ORDER — PANTOPRAZOLE SODIUM 40 MG PO TBEC: DELAYED_RELEASE_TABLET | ORAL | 1 refills | Status: DC

## 2019-07-12 ENCOUNTER — Encounter: Payer: Self-pay | Admitting: Orthopedic Surgery

## 2019-07-12 NOTE — Progress Notes (Signed)
Office Visit Note   Patient: Shannon Cummings           Date of Birth: August 26, 1945           MRN: [id number] Visit Date: 07/08/2019 Requested by: [name], MD [address] [place],  [place] [address] PCP: [name], MD  Subjective: Chief Complaint  Patient presents with  . Lower Back - Pain  . Left Hip - Pain    HPI: [name] is a patient with left hip pain.  She stepped off a ladder around Christmas and missed a step.  Does describe some radicular pain buttock pain and trochanteric tenderness.  She has been able to walk.  She denies any groin pain.  Initially she had fairly severe pain to walk and weight-bear.  She does have pain which wakes her from sleep at night.  She is tried activity modification home exercise program and stretching as well as X strength Tylenol and other conservative measures without relief.  She likes to walk and do yoga but not been able to do that because of the pain.  She describes both pain and weakness.  No radiation of symptoms below the knee.  Hard for her to sleep on that side.  Localizes pain in the trochanteric region as well as SI joint region.              ROS: All systems reviewed are negative as they relate to the chief complaint within the history of present illness.  Patient denies  fevers or chills.   Assessment & Plan: Visit Diagnoses:  1. Pain in left leg     Plan: Impression is left hip pain and SI joint type pain with radiographs which do show enthesopathic changes of the greater trochanter but no definite fracture.  Strength is reasonable but this could represent some type of abductor avulsion versus vertebral body compression fracture versus sacral insufficiency fracture.  Plan MRI scan lumbar spine due to 2 months of symptoms to evaluate sacral insufficiency fracture versus L spine compression fracture.  See her back after that study.  Follow-Up Instructions: Return for after MRI.   Orders:  Orders Placed This  Encounter  Procedures  . XR Lumbar Spine 2-3 Views  . XR HIP UNILAT W OR W/O PELVIS 2-3 VIEWS LEFT  . MR Lumbar Spine w/o contrast   No orders of the defined types were placed in this encounter.     Procedures: No procedures performed   Clinical Data: No additional findings.  Objective: Vital Signs: There were no vitals taken for this visit.  Physical Exam:   Constitutional: Patient appears well-developed HEENT:  Head: Normocephalic Eyes:EOM are normal Neck: Normal range of motion Cardiovascular: Normal rate Pulmonary/chest: Effort normal Neurologic: Patient is alert Skin: Skin is warm Psychiatric: Patient has normal mood and affect    Ortho Exam: Orthopedic exam demonstrates slightly antalgic gait to the left.  No groin pain with internal or external rotation of the leg.  She has palpable pedal pulses.  Very good hip flexion abduction adduction strength.  Symmetric reflexes.  Some pain with forward lateral bending.  She does have stroke tenderness on the left but not the right.  Specialty Comments:  No specialty comments available.  Imaging: No results found.   PMFS History: Patient Active Problem List   Diagnosis Date Noted  . COVID-19 05/13/2019  . Unstable angina ([place]) 05/13/2019  . Sinus bradycardia 06/15/2018  . Ischemic cardiomyopathy 06/15/2018  . NSTEMI (  non-ST elevated myocardial infarction) ([place]) 06/13/2018  . GERD (gastroesophageal reflux disease) 07/10/2016  . History of colonic polyps 02/25/2016  . Antiplatelet or antithrombotic long-term use 02/25/2016  . Nodule of right lung 10/13/2012  . History of non-ST elevation myocardial infarction (NSTEMI) 08/14/2012  . Hepatic steatosis 12/03/2011  . Paroxysmal atrial fibrillation ([place]) 12/22/2010  . CAD (coronary artery disease) 12/22/2010  . INSOMNIA 04/13/2010  . Hyperlipidemia 03/19/2010  . Essential hypertension 03/19/2010  . Obstructive sleep apnea 03/17/2010   Past Medical History:    Diagnosis Date  . Arthritis   . CAD (coronary artery disease)    a. prior stenting history. b. NSTEMI s/p DES to prox LAD with EF 45% by cath 08/14/12. c. Mild trop elevation shortly after cath ?mild plaque embolization - patent stent on relook. // d. Myoview 8/18: EF 60, normal perfusion; Low Risk. e. NSTEMI 06/2018- patent prior LAD stent, 80% OM1 s/p DES, normal LVEDP, EF 45-50%, moderate residual disease treated medically.  . Fatty infiltration of liver   . GERD (gastroesophageal reflux disease)   . Hyperlipidemia   . Hypertension   . Hypokalemia   . Hypotension   . Myocardial infarction ([place]) 2014  . OSA on CPAP   . Osteoporosis   . PAF (paroxysmal atrial fibrillation) ([place])    On rythmol previously  . Pulmonary nodule    a. 45mm subpleural nodular density CT 08/2012, instructed to f/u pulm MD.  . Sinus bradycardia   . Tubular adenoma of colon 2007    Family History  Problem Relation Age of Onset  . Heart disease Father   . Alzheimer's disease Mother   . Breast cancer Maternal Aunt   . Colon cancer Neg Hx     Past Surgical History:  Procedure Laterality Date  . CORONARY ANGIOPLASTY WITH STENT PLACEMENT  08/14/2012   LAD  Dr [name]  . CORONARY STENT INTERVENTION N/A 06/14/2018   Procedure: CORONARY STENT INTERVENTION;  Surgeon: Jettie Booze, MD;  Location: [facility];  Service: Cardiovascular;  Laterality: N/A;  . CORONARY STENT PLACEMENT  2000  . hair restoration  02/2017  . LEFT HEART CATH AND CORONARY ANGIOGRAPHY N/A 06/14/2018   Procedure: LEFT HEART CATH AND CORONARY ANGIOGRAPHY;  Surgeon: Jettie Booze, MD;  Location: [facility];  Service: Cardiovascular;  Laterality: N/A;  . LEFT HEART CATH AND CORONARY ANGIOGRAPHY N/A 05/14/2019   Procedure: LEFT HEART CATH AND CORONARY ANGIOGRAPHY;  Surgeon: [name], MD;  Location: [facility];  Service: Cardiovascular;  Laterality: N/A;  . LEFT HEART CATHETERIZATION WITH CORONARY  ANGIOGRAM N/A 08/14/2012   Procedure: LEFT HEART CATHETERIZATION WITH CORONARY ANGIOGRAM;  Surgeon: [name], MD;  Location[facility];  Service: Cardiovascular;  Laterality: N/A;  . LEFT HEART CATHETERIZATION WITH CORONARY ANGIOGRAM N/A 08/19/2012   Procedure: LEFT HEART CATHETERIZATION WITH CORONARY ANGIOGRAM;  Surgeon: [name], MD;  Location: [facility] CATH [facility];  Service: Cardiovascular;  Laterality: N/A;  . ROTATOR CUFF REPAIR    . TONSILLECTOMY    . TOTAL ABDOMINAL HYSTERECTOMY     Social History   Occupational History  . 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 Smoking status: Never Smoker  . Smokeless tobacco: Never Used  Substance and Sexual Activity  . Alcohol use: Yes    Comment: 2 drinks daily   . Drug use: No  . Sexual activity:  Not Currently

## 2019-07-14 ENCOUNTER — Other Ambulatory Visit: Payer: Self-pay | Admitting: Cardiovascular Disease

## 2019-07-14 MED ORDER — LOSARTAN POTASSIUM 25 MG PO TABS: 50.0000 mg | ORAL_TABLET | Freq: Every day | ORAL | 10 refills | Status: DC

## 2019-07-15 ENCOUNTER — Ambulatory Visit (HOSPITAL_COMMUNITY): Payer: Medicare Other | Admitting: Nurse Practitioner

## 2019-07-15 ENCOUNTER — Ambulatory Visit: Payer: Medicare Other | Attending: Internal Medicine

## 2019-07-15 DIAGNOSIS — Z23 Encounter for immunization: Secondary | ICD-10-CM | POA: Insufficient documentation

## 2019-07-15 NOTE — Progress Notes (Signed)
   Covid-19 Vaccination Clinic  Name:  Shannon Cummings    MRN: [id number] DOB: 09/16/1945  07/15/2019  [name] was observed post Covid-19 immunization for 15 minutes without incidence. She was provided with Vaccine Information Sheet and instruction to access the V-Safe system.   [name] was instructed to call 911 with any severe reactions post vaccine: Marland Kitchen Difficulty breathing  . Swelling of your face and throat  . A fast heartbeat  . A bad rash all over your body  . Dizziness and weakness    Immunizations Administered    Name Date Dose VIS Date Route   Pfizer COVID-19 Vaccine 07/15/2019  1:29 AM 0.3 mL 05/16/2019 Intramuscular   Manufacturer: [place]   Lot: [id number]   [place]: [id number]

## 2019-07-17 ENCOUNTER — Ambulatory Visit: Payer: Medicare Other

## 2019-07-21 ENCOUNTER — Encounter: Payer: Medicare Other | Admitting: *Deleted

## 2019-07-21 ENCOUNTER — Other Ambulatory Visit: Payer: Self-pay

## 2019-07-21 DIAGNOSIS — Z006 Encounter for examination for normal comparison and control in clinical research program: Secondary | ICD-10-CM

## 2019-07-21 NOTE — Research (Signed)
Subject to research clinic for visit [phone] in the Clear Research study.  No aes or saes to report.  New drug dispensed and next clinic visit and phone call scheduled.

## 2019-08-09 ENCOUNTER — Ambulatory Visit: Payer: Medicare Other

## 2019-08-09 DIAGNOSIS — N3001 Acute cystitis with hematuria: Secondary | ICD-10-CM | POA: Diagnosis not present

## 2019-08-11 ENCOUNTER — Ambulatory Visit (INDEPENDENT_AMBULATORY_CARE_PROVIDER_SITE_OTHER): Payer: Medicare Other | Admitting: Family Medicine

## 2019-08-11 ENCOUNTER — Other Ambulatory Visit: Payer: Self-pay

## 2019-08-11 ENCOUNTER — Encounter: Payer: Self-pay | Admitting: Family Medicine

## 2019-08-11 DIAGNOSIS — R300 Dysuria: Secondary | ICD-10-CM | POA: Diagnosis not present

## 2019-08-11 DIAGNOSIS — T50905A Adverse effect of unspecified drugs, medicaments and biological substances, initial encounter: Secondary | ICD-10-CM

## 2019-08-11 MED ORDER — CEPHALEXIN 500 MG PO CAPS: 500.0000 mg | ORAL_CAPSULE | Freq: Two times a day (BID) | ORAL | 0 refills | Status: AC

## 2019-08-11 NOTE — Progress Notes (Signed)
I have discussed the procedure for the virtual visit with the patient who has given consent to proceed with assessment and treatment.   Pt unable to obtain vitals.  Nitrofuratoin given to her on Saturday by UC. Severe abd pain, and nausea Saturday evening into Sunday. Began Diarrhea on Sunday evening and today.   Pt only took one dose of medication on Saturday.   Pt is still having urine urgency, frequency, some dysuria  Davis Gourd, CMA

## 2019-08-11 NOTE — Progress Notes (Signed)
Virtual Visit via Video   I connected with patient on 08/11/19 at  3:00 PM EST by a video enabled telemedicine application and verified that I am speaking with the correct person using two identifiers.  Location patient: Home Location provider: [facility]up[facility], Office Persons participating in the virtual visit: Patient, Provider, Solomon (Jess B)  I discussed the limitations of evaluation and management by telemedicine and the availability of in person appointments. The patient expressed understanding and agreed to proceed.  Subjective:   HPI:   UTI- pt developed UTI sxs on Friday w/ frequency, urgency, incomplete emptying.  And went to [facility] on Saturday.  Was prescribed Macrobid for + UA and after 1 dose developed severe abd pain, diarrhea.  Pt reports last episode of diarrhea was this AM.  Stomach pain has improved.  ROS:   See pertinent positives and negatives per HPI.  Patient Active Problem List   Diagnosis Date Noted  . COVID-19 05/13/2019  . Unstable angina ([place]) 05/13/2019  . Sinus bradycardia 06/15/2018  . Ischemic cardiomyopathy 06/15/2018  . NSTEMI (non-ST elevated myocardial infarction) ([place]) 06/13/2018  . GERD (gastroesophageal reflux disease) 07/10/2016  . History of colonic polyps 02/25/2016  . Antiplatelet or antithrombotic long-term use 02/25/2016  . Nodule of right lung 10/13/2012  . History of non-ST elevation myocardial infarction (NSTEMI) 08/14/2012  . Hepatic steatosis 12/03/2011  . Paroxysmal atrial fibrillation ([place]) 12/22/2010  . CAD (coronary artery disease) 12/22/2010  . INSOMNIA 04/13/2010  . Hyperlipidemia 03/19/2010  . Essential hypertension 03/19/2010  . Obstructive sleep apnea 03/17/2010    Social History   Tobacco Use  . Smoking status: Never Smoker  . Smokeless tobacco: Never Used  Substance Use Topics  . Alcohol use: Yes    Comment: 2 drinks daily     Current Outpatient Medications:  .  acetaminophen (TYLENOL) 650 MG CR  tablet, Take 650 mg by mouth every 8 (eight) hours as needed for pain., Disp: , Rfl:  .  Alirocumab (PRALUENT) 75 MG/ML SOAJ, Inject 75 mg into the skin every 14 (fourteen) days., Disp: 2 pen, Rfl: 11 .  AMBULATORY NON FORMULARY MEDICATION, Take 180 mg by mouth daily. Medication Name: bempedoic acid 180 mg vs a placebo.  CLEAR Research Study, drug provided, Disp: , Rfl:  .  amiodarone (PACERONE) 100 MG tablet, Take 1 tablet (100 mg total) by mouth daily., Disp: 90 tablet, Rfl: 3 .  amLODipine (NORVASC) 2.5 MG tablet, Take 1 tablet (2.5 mg total) by mouth daily., Disp: 30 tablet, Rfl: 11 .  Bempedoic Acid (NEXLETOL) 180 MG TABS, Take 1 tablet by mouth daily., Disp: 30 tablet, Rfl: 11 .  Cholecalciferol (VITAMIN D3) 25 MCG (1000 UT) CAPS, Take 1,000 Units by mouth daily., Disp: , Rfl:  .  clopidogrel (PLAVIX) 75 MG tablet, TAKE 1 TABLET BY MOUTH DAILY, Disp: 30 tablet, Rfl: 11 .  Cyanocobalamin (B-12) 1000 MCG LOZG, Take 1,000 mcg by mouth daily. , Disp: , Rfl:  .  ELIQUIS 5 MG TABS tablet, TAKE 1 TABLET(5 MG) BY MOUTH TWICE DAILY (Patient taking differently: Take 5 mg by mouth 2 (two) times daily. ), Disp: 180 tablet, Rfl: 1 .  fluticasone (FLONASE) 50 MCG/ACT nasal spray, Place 2 sprays into both nostrils daily as needed for allergies or rhinitis., Disp: , Rfl:  .  gabapentin (NEURONTIN) 300 MG capsule, Take 1 capsule (300 mg total) by mouth 3 (three) times daily. (Patient taking differently: Take 300 mg by mouth 3 (three) times daily as needed (for  pain). ), Disp: 21 capsule, Rfl: 0 .  Icosapent Ethyl (VASCEPA) 1 g CAPS, Take 2 capsules (2 g total) by mouth 2 (two) times daily., Disp: 120 capsule, Rfl: 11 .  losartan (COZAAR) 25 MG tablet, Take 2 tablets (50 mg total) by mouth daily., Disp: 60 tablet, Rfl: 10 .  nitroGLYCERIN (NITROSTAT) 0.4 MG SL tablet, Place 1 tablet (0.4 mg total) under the tongue every 5 (five) minutes x 3 doses as needed for chest pain., Disp: 25 tablet, Rfl: 3 .  pantoprazole  (PROTONIX) 40 MG tablet, TAKE 1 TABLET(40 MG) BY MOUTH DAILY, Disp: 90 tablet, Rfl: 1 .  potassium chloride (K-DUR) 10 MEQ tablet, TAKE 1 TABLET(10 MEQ) BY MOUTH DAILY (Patient taking differently: Take 10 mEq by mouth daily. ), Disp: 90 tablet, Rfl: 2 .  propranolol (INDERAL) 10 MG tablet, Take 1 tablet (10 mg total) by mouth 4 (four) times daily as needed. (Patient taking differently: Take 10 mg by mouth 4 (four) times daily as needed ("for A-Fib attacks"). ), Disp: 60 tablet, Rfl: 11 .  spironolactone (ALDACTONE) 25 MG tablet, Take 0.5 tablets (12.5 mg total) by mouth daily., Disp: 45 tablet, Rfl: 3 .  nitrofurantoin, macrocrystal-monohydrate, (MACROBID) 100 MG capsule, Take 100 mg by mouth 2 (two) times daily., Disp: , Rfl:   Allergies  Allergen Reactions  . Flomax [Tamsulosin] Other (See Comments)    Caused rapid HYPOTENSION- patient came very close to fainting (also caused weakness and vertigo)  . Isosorbide     Headache  . Other Anxiety and Other (See Comments)    Intolerance to strong pain medications=  Make her feel anxious and feel like coming out of her skin  . Effexor [Venlafaxine] Other (See Comments)    CAUSED TOO MUCH SEDATION; CANNOT TOLERATE  . Statins Other (See Comments)    Restless legs, bad feeling.  This has occurred with Crestor 5 mg once weekly, Lipitor 10 mg twice weekly, and Simvastatin daily  . Zetia [Ezetimibe] Other (See Comments)    Made the legs ache    Objective:   There were no vitals taken for this visit. AAOx3, NAD NCAT, EOMI No obvious CN deficits Coloring WNL Pt is able to speak clearly, coherently without shortness of breath or increased work of breathing.  Thought process is linear.  Mood is appropriate.   Assessment and Plan:   UTI- [facility] w/ [facility]TI.  She was unable to tolerate Macrobid so will switch to Keflex.  Pt expressed understanding and is in agreement w/ plan.   Adverse medication rxn- pt took 1 dose  of Macrobid and developed severe abd pain and diarrhea.  Sxs are improving since stopping medication.  Med added to allergy list.   Annye Asa, MD 08/11/2019

## 2019-08-12 ENCOUNTER — Ambulatory Visit: Payer: Medicare Other | Attending: Internal Medicine

## 2019-08-12 DIAGNOSIS — Z23 Encounter for immunization: Secondary | ICD-10-CM | POA: Insufficient documentation

## 2019-08-12 NOTE — Progress Notes (Signed)
   Covid-19 Vaccination Clinic  Name:  Carlan Holley    MRN: [id number] DOB: 1945-10-13  08/12/2019  Ms. Passarella was observed post Covid-19 immunization for 30 minutes based on pre-vaccination screening without incident. She was provided with Vaccine Information Sheet and instruction to access the V-Safe system.   Ms. Gouge was instructed to call 911 with any severe reactions post vaccine: Marland Kitchen Difficulty breathing  . Swelling of face and throat  . A fast heartbeat  . A bad rash all over body  . Dizziness and weakness   Immunizations Administered    Name Date Dose VIS Date Route   Pfizer COVID-19 Vaccine 08/12/2019 11:45 AM 0.3 mL 05/16/2019 Intramuscular   Manufacturer: [place]   Lot: [id number]   [place]: [id number]

## 2019-08-15 DIAGNOSIS — H16223 Keratoconjunctivitis sicca, not specified as Sjogren's, bilateral: Secondary | ICD-10-CM | POA: Diagnosis not present

## 2019-08-18 ENCOUNTER — Other Ambulatory Visit: Payer: Self-pay | Admitting: Cardiovascular Disease

## 2019-08-18 MED ORDER — POTASSIUM CHLORIDE ER 10 MEQ PO TBCR: EXTENDED_RELEASE_TABLET | ORAL | 2 refills | Status: DC

## 2019-08-22 ENCOUNTER — Other Ambulatory Visit: Payer: Medicare Other

## 2019-08-25 ENCOUNTER — Ambulatory Visit: Payer: Medicare Other | Admitting: Orthopedic Surgery

## 2019-08-26 ENCOUNTER — Other Ambulatory Visit: Payer: Self-pay | Admitting: Pharmacist

## 2019-08-26 MED ORDER — PRALUENT 75 MG/ML ~~LOC~~ SOAJ: 75.0000 mg | SUBCUTANEOUS | 11 refills | Status: DC

## 2019-08-27 DIAGNOSIS — H16223 Keratoconjunctivitis sicca, not specified as Sjogren's, bilateral: Secondary | ICD-10-CM | POA: Diagnosis not present

## 2019-08-27 DIAGNOSIS — H5213 Myopia, bilateral: Secondary | ICD-10-CM | POA: Diagnosis not present

## 2019-08-31 NOTE — Progress Notes (Signed)
Cardiology Office Note:    Date:  09/01/2019   ID:  Shannon Cummings, DOB [birth year], MRN [id number]  PCP:  Shannon Minium, MD  Cardiologist:  [name], MD   Electrophysiologist:  Thompson Grayer, MD   Referring MD: Shannon Minium, MD   Chief Complaint:  Follow-up (CAD, CHF, AFib, Hyperlipidemia)    Patient Profile:    Shannon Cummings is a 74 y.o. female with:   Coronary artery disease  ? s/p prior PCI to LAD ? S/p NSTEMI in 2014 in setting of critical LAD ISR >> s/p DES to LAD ? S/p NSTEMI 06/2018 >> PCI: DES to OM1 ? S/p NSTEMI 05/2019 >> LAD and OM1 stents ok; mod dz in D1 and RPDA >> med Rx  Persistent atrial fibrillation  ? Previously tx with Propafenone >> DCd 06/2018 ? Amiodarone Rx ? Saw Dr. Rayann Heman in 08/2018 and is considering PVI ablation   Combined Systolic and Diastolic CHF  Hypertension   Hyperlipidemia   OSA on CPAP  Prior CV studies: Cardiac catheterization 2019-05-23 LAD prox stent patent; D1 ost 70 OM1 ost 25, prox stent patent RPDA 50 EF 45-50  Chest CTA 05/13/2019 IMPRESSION: 1. Negative for pulmonary embolism. 2. Dependent atelectasis.  Cardiac catheterization 06/14/2018 LAD prox stent patent; D1 ost 25 LCx ost 25; OM1 80 RPDA 50 EF 45-50 PCI: 2.5 x 12 mm Synergy DES to OM1  Echocardiogram 06/14/2018 EF 40-45, ant-lat and inf-lat HK, Gr 1 DD, L pleural effusion  Myoview 01/10/2017 EF 60, normal perfusion; Low Risk  Chest CTA 01/05/17 IMPRESSION: No evidence of thoracic or abdominal aortic aneurysm or dissection. No acute abnormality is noted in the chest, abdomen or pelvis.  LHC 3/14 LAD proximal stent with 99 ISR; D1 40 LCx patent RCA patent EF 45 PCI: 3 x 20 mm Promus Premier DESTo the proximal LAD  Echo 3/14 EF 45-50, anteroseptal and apical hypokinesis   History of Present Illness:    [name] was last seen in clinic in 05/2019 after her most recent MI.    She returns for follow-up.  She is here  alone.  Since last seen, she has been doing well without chest discomfort..  She has not had significant shortness of breath.  She has not had syncope.  She does have difficulty with sleeping.  She has not had lower extremity swelling.  She has been having difficulty with affording her medications.  Her Alirocumab cost is recently increased.  I had our clinical pharmacist, Fuller Canada, PharmD, see her today.  She will look into some options to help with the cost of her medications.  Past Medical History:  Diagnosis Date  . Arthritis   . CAD (coronary artery disease)    a. prior stenting history. b. NSTEMI s/p DES to prox LAD with EF 45% by cath 08/14/12. c. Mild trop elevation shortly after cath ?mild plaque embolization - patent stent on relook. // d. Myoview 8/18: EF 60, normal perfusion; Low Risk. e. NSTEMI 06/2018- patent prior LAD stent, 80% OM1 s/p DES, normal LVEDP, EF 45-50%, moderate residual disease treated medically.  . Fatty infiltration of liver   . GERD (gastroesophageal reflux disease)   . Hyperlipidemia   . Hypertension   . Hypokalemia   . Hypotension   . Myocardial infarction ([place]) 2014  . OSA on CPAP   . Osteoporosis   . PAF (paroxysmal atrial fibrillation) ([place])    On rythmol previously  . Pulmonary nodule    a. 31mm subpleural nodular  density CT 08/2012, instructed to f/u pulm MD.  . Sinus bradycardia   . Tubular adenoma of colon 2007    Current Medications: Current Meds  Medication Sig  . acetaminophen (TYLENOL) 650 MG CR tablet Take 650 mg by mouth every 8 (eight) hours as needed for pain.  . Alirocumab (PRALUENT) 75 MG/ML SOAJ Inject 75 mg into the skin every 14 (fourteen) days.  . AMBULATORY NON FORMULARY MEDICATION Take 180 mg by mouth daily. Medication Name: bempedoic acid 180 mg vs a placebo.  CLEAR Research Study, drug provided  . amiodarone (PACERONE) 100 MG tablet Take 1 tablet (100 mg total) by mouth daily.  Marland Kitchen amLODipine (NORVASC) 2.5 MG tablet Take 1 tablet  (2.5 mg total) by mouth daily.  . Bempedoic Acid (NEXLETOL) 180 MG TABS Take 1 tablet by mouth daily.  . Cholecalciferol (VITAMIN D3) 25 MCG (1000 UT) CAPS Take 1,000 Units by mouth daily.  . clopidogrel (PLAVIX) 75 MG tablet TAKE 1 TABLET BY MOUTH DAILY  . Cyanocobalamin (B-12) 1000 MCG LOZG Take 1,000 mcg by mouth daily.   Marland Kitchen ELIQUIS 5 MG TABS tablet TAKE 1 TABLET(5 MG) BY MOUTH TWICE DAILY  . fluticasone (FLONASE) 50 MCG/ACT nasal spray Place 2 sprays into both nostrils daily as needed for allergies or rhinitis.  Vanessa Kick Ethyl (VASCEPA) 1 g CAPS Take 2 capsules (2 g total) by mouth 2 (two) times daily.  Marland Kitchen losartan (COZAAR) 25 MG tablet Take 2 tablets (50 mg total) by mouth daily.  . nitroGLYCERIN (NITROSTAT) 0.4 MG SL tablet Place 1 tablet (0.4 mg total) under the tongue every 5 (five) minutes x 3 doses as needed for chest pain.  . pantoprazole (PROTONIX) 40 MG tablet TAKE 1 TABLET(40 MG) BY MOUTH DAILY  . potassium chloride (KLOR-CON) 10 MEQ tablet TAKE 1 TABLET(10 MEQ) BY MOUTH DAILY  . propranolol (INDERAL) 10 MG tablet Take 10 mg by mouth 4 (four) times daily as needed (for A Fib Attacks).  Marland Kitchen spironolactone (ALDACTONE) 25 MG tablet Take 0.5 tablets (12.5 mg total) by mouth daily.     Allergies:   Effexor [venlafaxine], Flomax [tamsulosin], Isosorbide, Macrobid [nitrofurantoin], Other, Statins, and Zetia [ezetimibe]   Social History   Tobacco Use  . Smoking status: Never Smoker  . Smokeless tobacco: Never Used  Substance Use Topics  . Alcohol use: Yes    Comment: 2 drinks daily   . Drug use: No     Family Hx: The patient's family history includes Alzheimer's disease in her mother; Breast cancer in her maternal aunt; Heart disease in her father. There is no history of Colon cancer.  ROS   EKGs/Labs/Other Test Reviewed:    EKG:  EKG is   ordered today.  The ekg ordered today demonstrates sinus bradycardia, HR 58, normal axis, nonspecific ST-T wave changes, QTC 461, no  change from prior tracing  Recent Labs: 04/28/2019: ALT 29; TSH 4.70 05/14/2019: BUN 8; Creatinine, Ser 1.03; Hemoglobin 14.1; Platelets 183; Potassium 3.7; Sodium 138   Recent Lipid Panel Lab Results  Component Value Date/Time   CHOL 200 05/14/2019 07:30 AM   CHOL 210 (H) 11/14/2018 12:10 PM   TRIG 105 05/14/2019 07:30 AM   HDL 63 05/14/2019 07:30 AM   HDL 60 11/14/2018 12:10 PM   CHOLHDL 3.2 05/14/2019 07:30 AM   LDLCALC 116 (H) 05/14/2019 07:30 AM   LDLCALC 119 (H) 11/14/2018 12:10 PM   LDLDIRECT 111.0 04/28/2019 11:38 AM    Physical Exam:    VS:  BP  130/70   Pulse (!) 58   Ht 5' (1.524 m)   Wt 140 lb 12.8 oz (63.9 kg)   SpO2 96%   BMI 27.50 kg/m     Wt Readings from Last 3 Encounters:  09/01/19 140 lb 12.8 oz (63.9 kg)  06/03/19 137 lb 6.4 oz (62.3 kg)  05/14/19 137 lb (62.1 kg)     Constitutional:      Appearance: Healthy appearance. Not in distress.  Neck:     Thyroid: No thyromegaly.     Vascular: JVD normal.  Pulmonary:     Breath sounds: No wheezing. No rales.  Cardiovascular:     Normal rate. Regular rhythm. Normal S1. Normal S2.     Murmurs: There is no murmur.  Edema:    Peripheral edema absent.  Abdominal:     Palpations: Abdomen is soft. There is no hepatomegaly.  Skin:    General: Skin is warm and dry.  Neurological:     General: No focal deficit present.     Mental Status: Alert and oriented to person, place and time.       ASSESSMENT & PLAN:    1. Coronary artery disease involving native coronary artery of native heart with angina pectoris ([place]) Hx of prior PCI to the LAD and subsequent non-STEMI in 2014 secondary to in-stent restenosis which was treated with a drug-eluting stent to the LAD.  She had another non-STEMI in January 2020 which was treated with a drug-eluting stent to the OM1.    She had another non-ST elevation myocardial infarction in December 2020.  Cardiac catheterization demonstrated patent stents in the LAD and OM.  She  has residual moderate disease elsewhere.    She has been managed medically.  Since last seen, she has not had any further anginal symptoms on her current medical therapy which includes amlodipine, clopidogrel.  Continue Alirocumab, bempedoic acid, losartan.  She is not on beta-blocker therapy secondary to bradycardia.  Follow-up with Dr. Acie Fredrickson in 6 months.   2. Chronic combined systolic and diastolic CHF (congestive heart failure) (HCC) Ischemic cardiomyopathy.  EF 40-45 by echocardiogram January 2020.  NYHA II.  Volume status stable.  As noted, she is not on beta-blocker therapy secondary to bradycardia.  Continue losartan, spironolactone.  3. Paroxysmal atrial fibrillation (HCC) Maintaining sinus rhythm.  She remains on amiodarone therapy.  TSH in November 2020 was normal.  She has a follow-up CMET pending today.  She is tolerating anticoagulation.  Continue current dose of Apixaban.  Hemoglobin and creatinine in December 2020 was normal.    4. Essential hypertension The patient's blood pressure is controlled on her current regimen.  Continue current therapy.   5. Pure hypercholesterolemia Continue Alirocumab, bempedoic acid.  We also have icosapent ethyl on her medication list.  She is not certain that she is taking this.  She will verify this when she gets home today.  She has follow-up CMET, fasting lipids pending today.  As noted, her pharmacist spoke with the patient.  She will look into potential options for medication coverage.  Some of these depend upon her annual income.  She will review her taxes and our pharmacist will contact her later.    Dispo:  Return in about 6 months (around 03/03/2020) for Routine Follow Up, w/ Dr. Acie Fredrickson, in person.   Medication Adjustments/Labs and Tests Ordered: Current medicines are reviewed at length with the patient today.  Concerns regarding medicines are outlined above.  Tests Ordered: Orders Placed This Encounter  Procedures  . EKG 12-Lead    Medication Changes: No orders of the defined types were placed in this encounter.   Signed, Richardson Dopp, PA-C  09/01/2019 12:46 PM    [facility] [address], [place], [place]  [address] Phone: [phone]; Fax: [phone]

## 2019-09-01 ENCOUNTER — Encounter: Payer: Self-pay | Admitting: Physician Assistant

## 2019-09-01 ENCOUNTER — Ambulatory Visit (INDEPENDENT_AMBULATORY_CARE_PROVIDER_SITE_OTHER): Payer: Medicare Other | Admitting: Physician Assistant

## 2019-09-01 ENCOUNTER — Other Ambulatory Visit: Payer: Medicare Other | Admitting: *Deleted

## 2019-09-01 ENCOUNTER — Other Ambulatory Visit: Payer: Self-pay

## 2019-09-01 VITALS — BP 130/70 | HR 58 | Ht 60.0 in | Wt 140.8 lb

## 2019-09-01 DIAGNOSIS — I480 Paroxysmal atrial fibrillation: Secondary | ICD-10-CM

## 2019-09-01 DIAGNOSIS — E7800 Pure hypercholesterolemia, unspecified: Secondary | ICD-10-CM

## 2019-09-01 DIAGNOSIS — I10 Essential (primary) hypertension: Secondary | ICD-10-CM

## 2019-09-01 DIAGNOSIS — I5042 Chronic combined systolic (congestive) and diastolic (congestive) heart failure: Secondary | ICD-10-CM | POA: Diagnosis not present

## 2019-09-01 DIAGNOSIS — I25119 Atherosclerotic heart disease of native coronary artery with unspecified angina pectoris: Secondary | ICD-10-CM | POA: Diagnosis not present

## 2019-09-01 NOTE — Patient Instructions (Addendum)
Medication Instructions:   Check your medications when you get home.  See if you are taking: Vascepa (Icosapent Ethyl) 1 gram (take 2 capsules twice daily)  *If you need a refill on your cardiac medications before your next appointment, please call your pharmacy*   Lab Work: Today - CMET, Lipids  If you have labs (blood work) drawn today and your tests are completely normal, you will receive your results only by: Marland Kitchen MyChart Message (if you have MyChart) OR . A paper copy in the mail If you have any lab test that is abnormal or we need to change your treatment, we will call you to review the results.   Follow-Up: At [facility], you and your health needs are our priority.  As part of our continuing mission to provide you with exceptional heart care, we have created designated Provider Care Teams.  These Care Teams include your primary Cardiologist (physician) and Advanced Practice Providers (APPs -  Physician Assistants and Nurse Practitioners) who all work together to provide you with the care you need, when you need it.  We recommend signing up for the patient portal called "MyChart".  Sign up information is provided on this After Visit Summary.  MyChart is used to connect with patients for Virtual Visits (Telemedicine).  Patients are able to view lab/test results, encounter notes, upcoming appointments, etc.  Non-urgent messages can be sent to your provider as well.   To learn more about what you can do with MyChart, go to [email or web address].    Your next appointment:   6 month(s)  The format for your next appointment:   In Person  Provider:   [name], MD   Other Instructions Check taxes when you get home. Jinny Blossom will touch base with you about coverage for your medications.

## 2019-09-02 ENCOUNTER — Telehealth: Payer: Self-pay | Admitting: Pharmacist

## 2019-09-02 NOTE — Telephone Encounter (Signed)
Patient was approved for $2500 in pt assistance through the next 12 months through [employer].  This will make her Praluent and Nexletol free. Lipid panel from yesterday is still pending. Depending on TG, can consider starting Vascepa (listed on patient's med list but she denied ever taking this). Vascepa would be covered under the grant as well. Shannon Cummings info has been sent to her pharmacy and patient is aware.

## 2019-09-02 NOTE — Telephone Encounter (Signed)
Thank you so much!! Shannon Cummings

## 2019-09-03 ENCOUNTER — Other Ambulatory Visit: Payer: Self-pay

## 2019-09-03 LAB — COMPREHENSIVE METABOLIC PANEL
ALT: 25 IU/L (ref 0–32)
AST: 35 IU/L (ref 0–40)
Albumin/Globulin Ratio: 2 (ref 1.2–2.2)
Albumin: 4.7 g/dL (ref 3.7–4.7)
Alkaline Phosphatase: 53 IU/L (ref 39–117)
BUN/Creatinine Ratio: 10 — ABNORMAL LOW (ref 12–28)
BUN: 12 mg/dL (ref 8–27)
Bilirubin Total: 0.7 mg/dL (ref 0.0–1.2)
CO2: 23 mmol/L (ref 20–29)
Calcium: 9.3 mg/dL (ref 8.7–10.3)
Chloride: 98 mmol/L (ref 96–106)
Creatinine, Ser: 1.15 mg/dL — ABNORMAL HIGH (ref 0.57–1.00)
GFR calc Af Amer: 55 mL/min/{1.73_m2} — ABNORMAL LOW (ref >59)
GFR calc non Af Amer: 47 mL/min/{1.73_m2} — ABNORMAL LOW (ref >59)
Globulin, Total: 2.4 g/dL (ref 1.5–4.5)
Glucose: 84 mg/dL (ref 65–99)
Potassium: 4.2 mmol/L (ref 3.5–5.2)
Sodium: 138 mmol/L (ref 134–144)
Total Protein: 7.1 g/dL (ref 6.0–8.5)

## 2019-09-03 LAB — LIPID PANEL
Chol/HDL Ratio: 3.4 ratio (ref 0.0–4.4)
Cholesterol, Total: 191 mg/dL (ref 100–199)
HDL: 57 mg/dL (ref >39)
LDL Chol Calc (NIH): 99 mg/dL (ref 0–99)
Triglycerides: 207 mg/dL — ABNORMAL HIGH (ref 0–149)
VLDL Cholesterol Cal: 35 mg/dL (ref 5–40)

## 2019-09-03 MED ORDER — ICOSAPENT ETHYL 1 G PO CAPS: 2.0000 g | ORAL_CAPSULE | Freq: Two times a day (BID) | ORAL | 11 refills | Status: DC

## 2019-09-03 MED ORDER — CLOPIDOGREL BISULFATE 75 MG PO TABS: 75.0000 mg | ORAL_TABLET | Freq: Every day | ORAL | 3 refills | Status: DC

## 2019-09-03 NOTE — Telephone Encounter (Signed)
Lipid panel has resulted from 09/01/19- triglycerides are elevated at 207. With extensive cardiac disease and elevated TG, pt would benefit from starting Vascepa. Will send in rx to pharmacy and apply grant for $0 copay. Pt is aware and is in agreement.  Her LDL has improved from 116 to 99 but still remains above goal < 70. Pt is previously intolerant to rosuvastatin 5mg  once weekly and daily, atorvastatin 10mg  twice weekly and daily, simvastatin, Livalo, and Zetia (leg aching with each). She does not wish to increase her Praluent dose since she is currently experiencing some leg aching. She is willing to continue on Praluent and Nexletol for now - we discussed that her baseline LDL is very high at 190 and we need to keep it as low as possible with her history of recurrent ASCVD which she understands. I will plan to reach out to pt once inclisiran becomes FDA approved as an alternative medication since pt is interested in this.  She also plans to increase her physical activity and improve her diet as she reports she could make some improvements. Did not discuss CMET results with pt.

## 2019-09-05 ENCOUNTER — Telehealth: Payer: Self-pay | Admitting: Family Medicine

## 2019-09-05 NOTE — Progress Notes (Signed)
  Chronic Care Management   Outreach Note  09/05/2019 Name: Penelopi Vanhorne MRN: [id number] DOB: Oct 27, 1945  Referred by: [name], MD Reason for referral : No chief complaint on file.   An unsuccessful telephone outreach was attempted today. The patient was referred to the pharmacist for assistance with care management and care coordination.   Follow Up Plan:   Earney Hamburg Upstream Scheduler

## 2019-09-11 ENCOUNTER — Telehealth: Payer: Self-pay | Admitting: Family Medicine

## 2019-09-11 NOTE — Progress Notes (Signed)
  Chronic Care Management   Outreach Note  09/11/2019 Name: Shannon Cummings MRN: [id number] DOB: Aug 29, 1945  Referred by: [name], MD Reason for referral : No chief complaint on file.   An unsuccessful telephone outreach was attempted today. The patient was referred to the pharmacist for assistance with care management and care coordination.   Follow Up Plan:   Earney Hamburg Upstream Scheduler

## 2019-09-24 ENCOUNTER — Telehealth: Payer: Self-pay | Admitting: Family Medicine

## 2019-09-24 NOTE — Progress Notes (Signed)
  Chronic Care Management   Outreach Note  09/24/2019 Name: Shannon Cummings MRN: [id number] DOB: September 20, 1945  Referred by: [name], MD Reason for referral : No chief complaint on file.   Third unsuccessful telephone outreach was attempted today. The patient was referred to the pharmacist for assistance with care management and care coordination.   Follow Up Plan:   Earney Hamburg Upstream Scheduler

## 2019-09-30 ENCOUNTER — Telehealth: Payer: Self-pay | Admitting: Family Medicine

## 2019-09-30 NOTE — Progress Notes (Signed)
  Chronic Care Management   Note  09/30/2019 Name: Shannon Cummings MRN: [id number] DOB: 07-Jan-1946  [name] is a 74 y.o. year old female who is a primary care patient of Birdie Riddle, Aundra Millet, MD. I reached out to [employer] by phone today in response to a referral sent by Ms. Tavares PCP, [name], MD.   Ms. Lucca was given information about Chronic Care Management services today including:  1. CCM service includes personalized support from designated clinical staff supervised by her physician, including individualized plan of care and coordination with other care providers 2. 24/7 contact phone numbers for assistance for urgent and routine care needs. 3. Service will only be billed when office clinical staff spend 20 minutes or more in a month to coordinate care. 4. Only one practitioner may furnish and bill the service in a calendar month. 5. The patient may stop CCM services at any time (effective at the end of the month) by phone call to the office staff.   Patient did not agree to services and wishes to consider information provided before deciding about enrollment in care management services.  This note is not being shared with the patient for the following reason: To respect privacy (The patient or proxy has requested that the information not be shared). Follow up plan:   Earney Hamburg Upstream Scheduler

## 2019-10-06 ENCOUNTER — Other Ambulatory Visit: Payer: Self-pay

## 2019-10-06 MED ORDER — APIXABAN 5 MG PO TABS: ORAL_TABLET | ORAL | 1 refills | Status: DC

## 2019-10-06 NOTE — Telephone Encounter (Signed)
Pt last saw Richardson Dopp, PA on 09/01/19, last labs 09/01/19 Creat 1.15, age 74, weight 63.9kg, based on specified criteria pt is on appropriate dosage of Eliquis 5mg  BID.  Will refill rx.

## 2019-10-30 ENCOUNTER — Telehealth: Payer: Self-pay | Admitting: *Deleted

## 2019-10-30 NOTE — Telephone Encounter (Signed)
Did telephone/medical record check 3 month follow up post in clinic visit for CLEAR research study. All con medications updated. No AE's or SAE's to report. Next in clinic visit is scheduled.

## 2019-12-10 IMAGING — DX DG CHEST 2V
2 series · 2 of 2 positions shown · non-contrast
Comparison: 06/13/2018

CLINICAL DATA: Sudden onset palpitations and left-sided chest pain
today. Recent myocardial infarct.

EXAM:
CHEST - 2 VIEW

[chest pa]
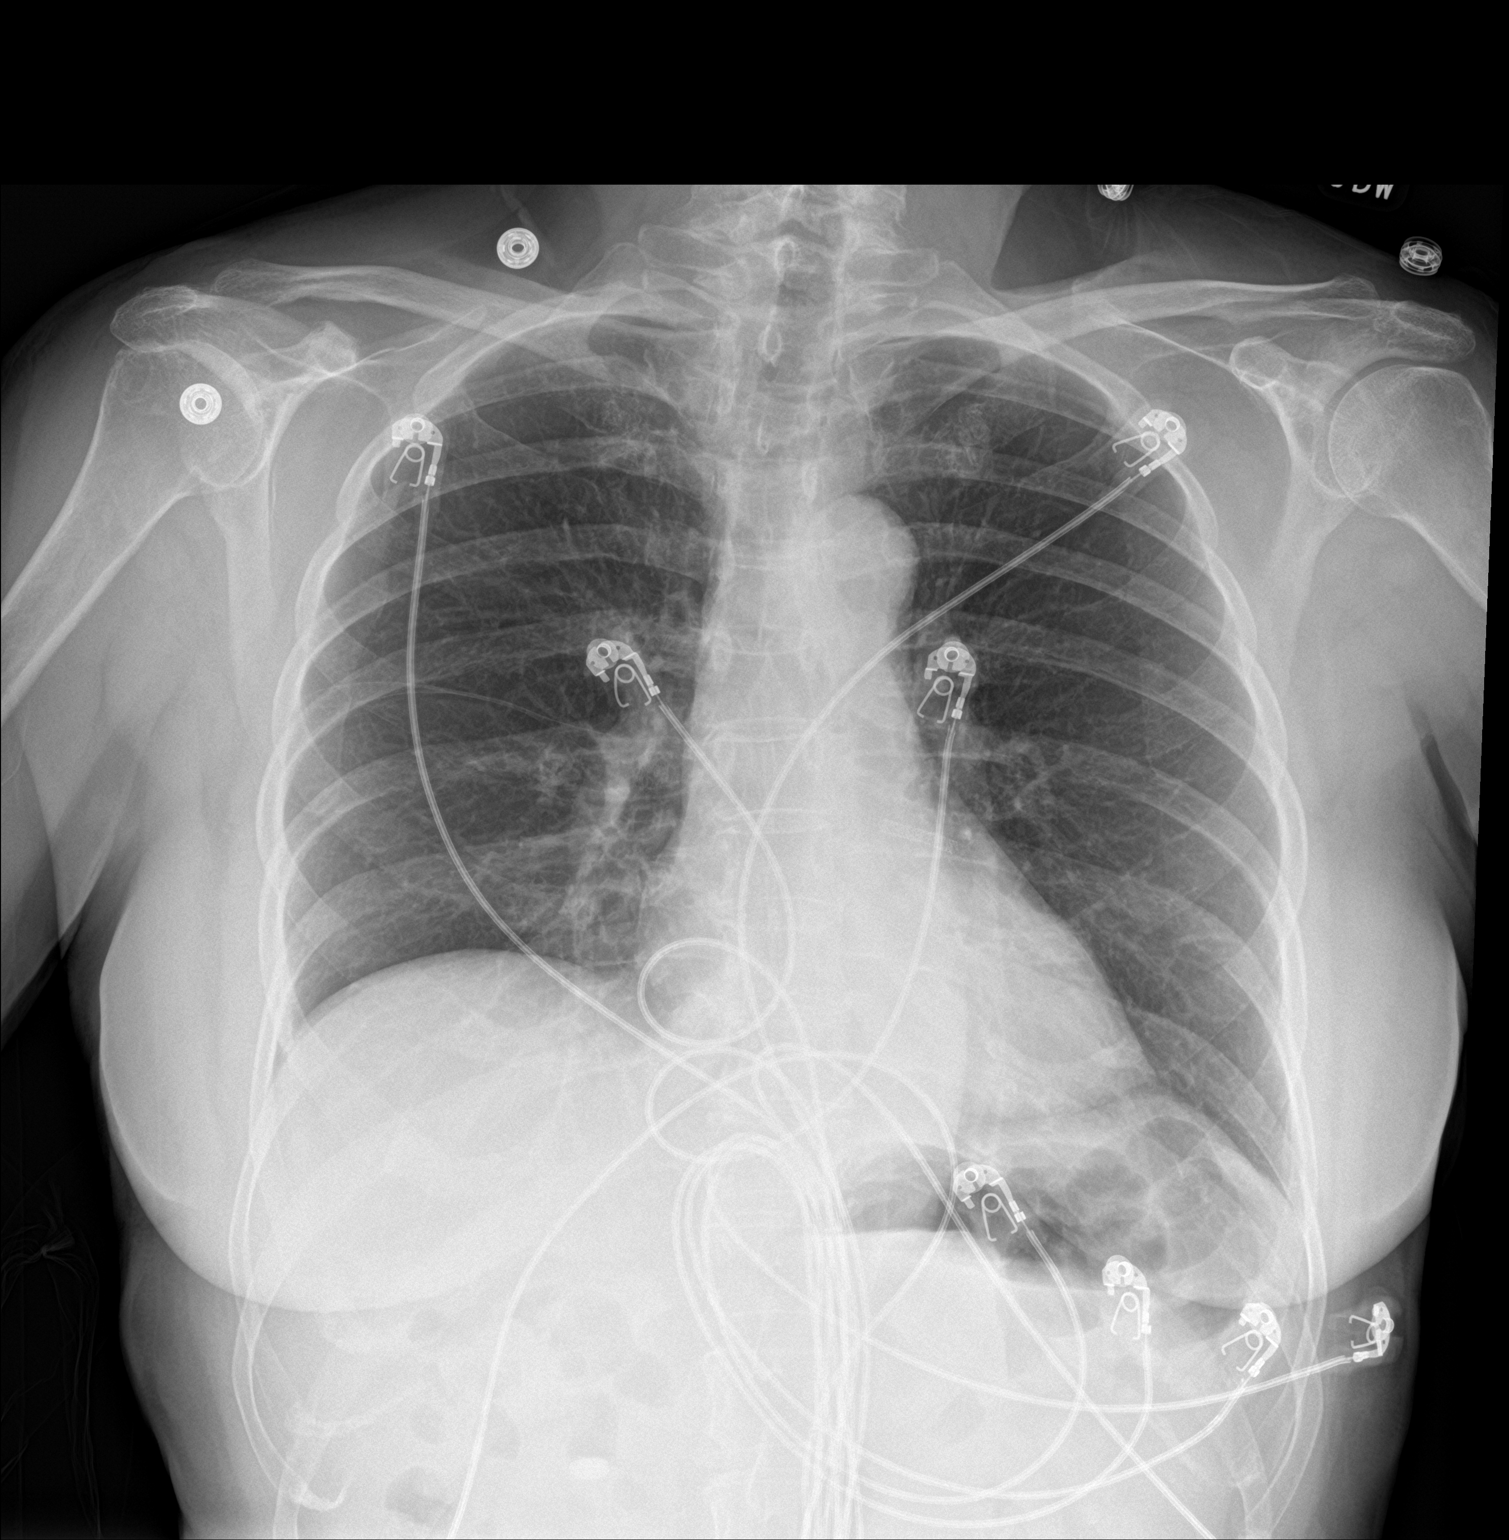

[chest lat]
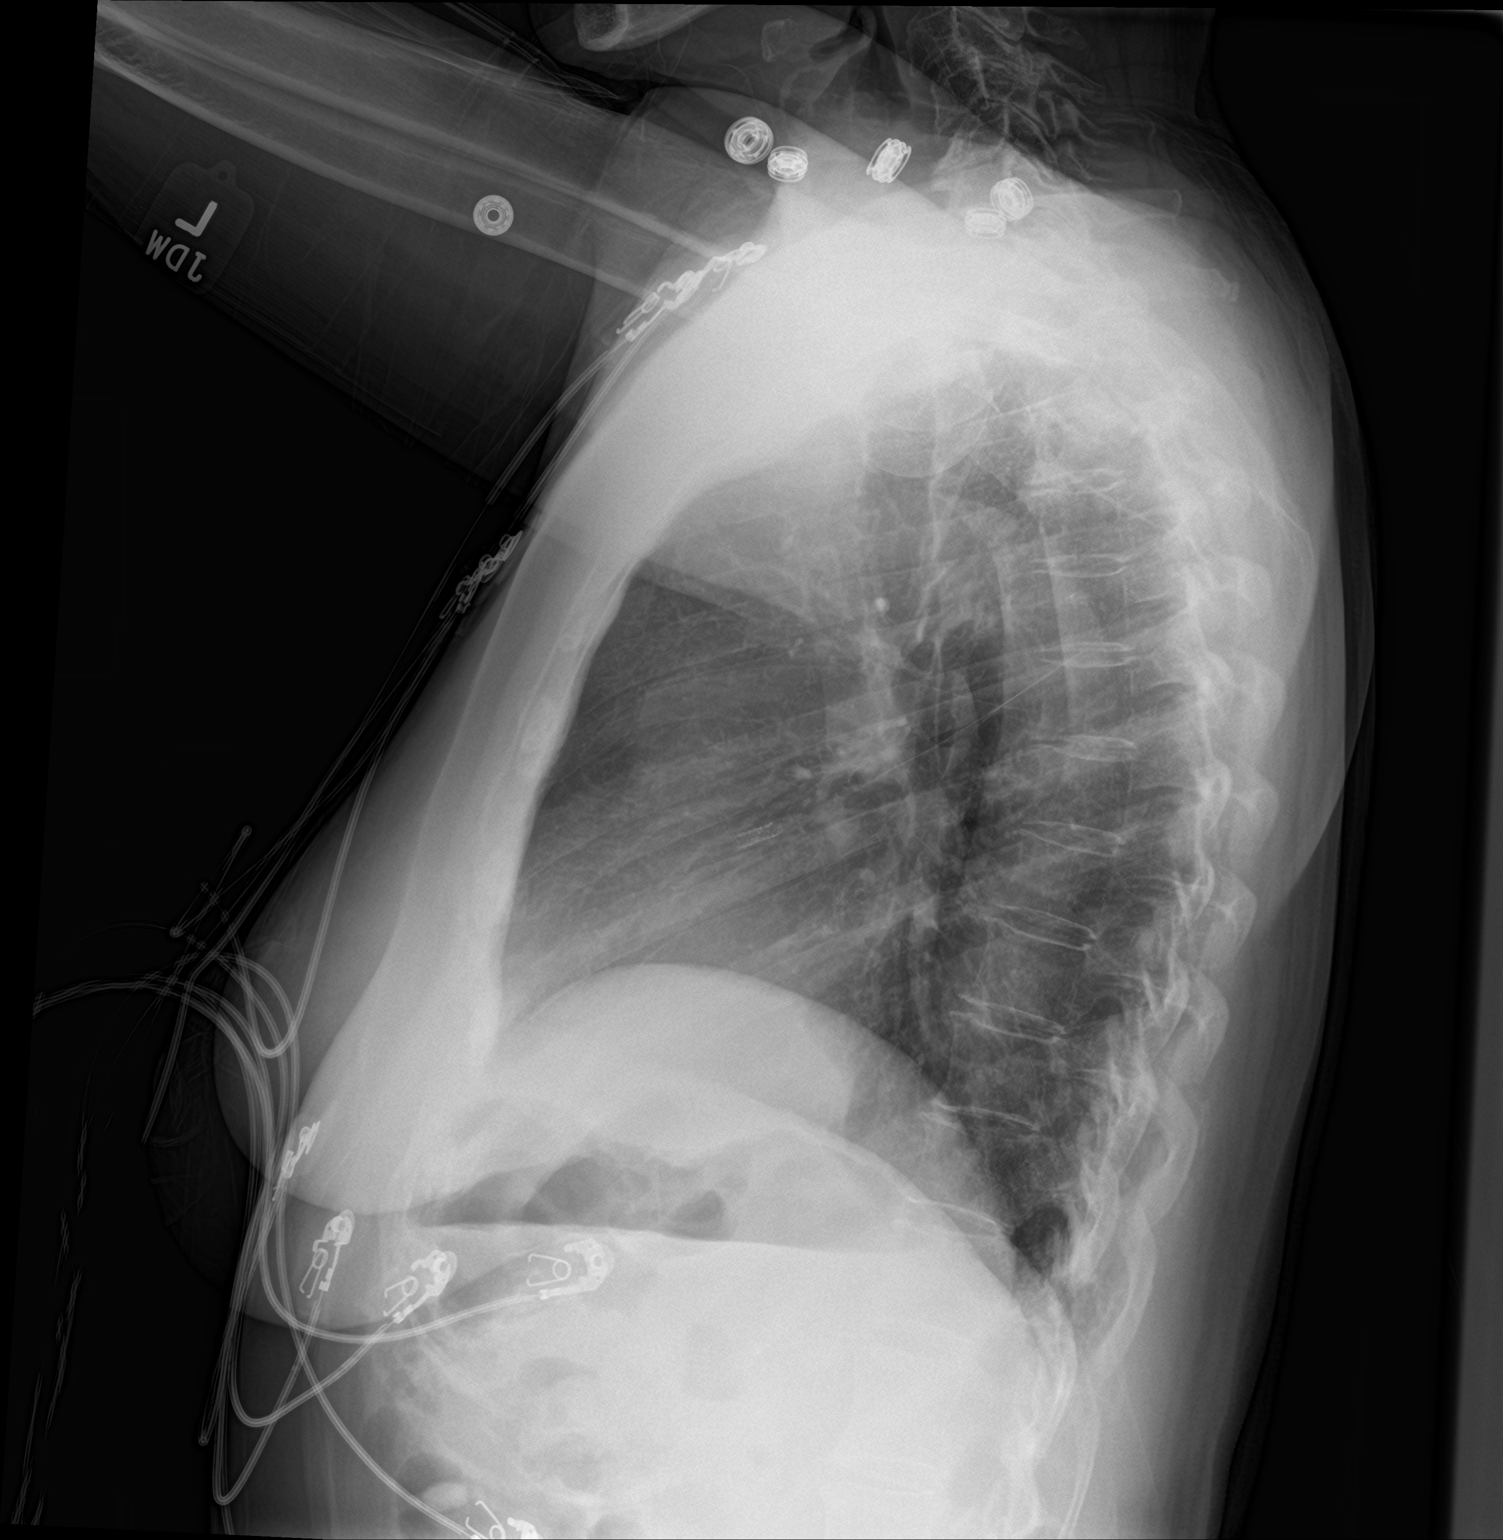

[2 of 2 positions shown; findings below may reference images not displayed]

FINDINGS: Normal heart size and pulmonary vascularity. No focal airspace
disease or consolidation in the lungs. No blunting of costophrenic
angles. No pneumothorax. Mediastinal contours appear intact.
Coronary artery stent.
IMPRESSION: No active cardiopulmonary disease.

## 2020-01-06 ENCOUNTER — Other Ambulatory Visit: Payer: Self-pay | Admitting: Family Medicine

## 2020-01-07 ENCOUNTER — Telehealth: Payer: Self-pay | Admitting: Family Medicine

## 2020-01-07 MED ORDER — FLUTICASONE PROPIONATE 50 MCG/ACT NA SUSP: 2.0000 | Freq: Every day | NASAL | 1 refills | Status: DC | PRN

## 2020-01-07 NOTE — Telephone Encounter (Signed)
Medication filled to pharmacy as requested.    Pt is overdue a Blood Pressure and cholesterol follow up. Please schedule.

## 2020-01-07 NOTE — Telephone Encounter (Signed)
Needs a refill on flucticasole propionate  50 mcg - Please send to [facility] - last appt. 08/11/2019

## 2020-01-07 NOTE — Telephone Encounter (Signed)
Called patient and left her a voicemail stating that her nasal spray was called in and that she needs to call us back to schedule a follow up with Dr. Birdie Riddle

## 2020-01-08 ENCOUNTER — Other Ambulatory Visit: Payer: Self-pay | Admitting: General Practice

## 2020-01-08 MED ORDER — FLUTICASONE PROPIONATE 50 MCG/ACT NA SUSP: 2.0000 | Freq: Every day | NASAL | 1 refills | Status: DC | PRN

## 2020-01-27 ENCOUNTER — Encounter: Payer: Medicare Other | Admitting: *Deleted

## 2020-01-27 ENCOUNTER — Other Ambulatory Visit: Payer: Self-pay

## 2020-01-27 VITALS — BP 124/83 | HR 60 | Temp 98.4°F | Resp 16 | Wt 138.8 lb

## 2020-01-27 DIAGNOSIS — Z006 Encounter for examination for normal comparison and control in clinical research program: Secondary | ICD-10-CM

## 2020-01-27 NOTE — Research (Signed)
Patient came to clinic for T18, [place] visit for the CLEAR research study.   Subject was re-consented on [place] Version 8.1 [id number] Local [id number] at 1005 on 01/27/2020.  Subject Name: Shannon Cummings  Subject met inclusion and exclusion criteria.  The informed consent form, study requirements and expectations were reviewed with the subject and questions and concerns were addressed prior to the signing of the consent form.  The subject verbalized understanding of the trial requirements.  The subject agreed to participate in the CLEAR trial and signed the informed consent at 1005 on 01/27/20  The informed consent was obtained prior to performance of any protocol-specific procedures for the subject.  A copy of the signed informed consent was given to the subject and a copy was placed in the subject's medical record.   Preston Fleeting C   Subject is not on study IP. Concomitant medications reviewed and updated. No AE's/SAE's to report. Next in-clinic appointment scheduled.

## 2020-01-27 NOTE — Addendum Note (Signed)
Addended by: Sandie Ano D on: 01/27/2020 04:21 PM   Modules accepted: Orders

## 2020-01-29 DIAGNOSIS — N9089 Other specified noninflammatory disorders of vulva and perineum: Secondary | ICD-10-CM | POA: Diagnosis not present

## 2020-01-29 DIAGNOSIS — R102 Pelvic and perineal pain: Secondary | ICD-10-CM | POA: Diagnosis not present

## 2020-03-12 ENCOUNTER — Emergency Department (HOSPITAL_BASED_OUTPATIENT_CLINIC_OR_DEPARTMENT_OTHER)
Admission: EM | Admit: 2020-03-12 | Discharge: 2020-03-13 | Disposition: A | Discharge status: Home / Self Care | Payer: Medicare Other | Attending: Emergency Medicine | Admitting: Emergency Medicine

## 2020-03-12 ENCOUNTER — Other Ambulatory Visit: Payer: Self-pay

## 2020-03-12 ENCOUNTER — Emergency Department (HOSPITAL_BASED_OUTPATIENT_CLINIC_OR_DEPARTMENT_OTHER): Payer: Medicare Other

## 2020-03-12 ENCOUNTER — Encounter (HOSPITAL_BASED_OUTPATIENT_CLINIC_OR_DEPARTMENT_OTHER): Payer: Self-pay | Admitting: Emergency Medicine

## 2020-03-12 DIAGNOSIS — I25110 Atherosclerotic heart disease of native coronary artery with unstable angina pectoris: Secondary | ICD-10-CM | POA: Diagnosis not present

## 2020-03-12 DIAGNOSIS — K8020 Calculus of gallbladder without cholecystitis without obstruction: Secondary | ICD-10-CM | POA: Diagnosis not present

## 2020-03-12 DIAGNOSIS — A0472 Enterocolitis due to Clostridium difficile, not specified as recurrent: Secondary | ICD-10-CM | POA: Insufficient documentation

## 2020-03-12 DIAGNOSIS — K6289 Other specified diseases of anus and rectum: Secondary | ICD-10-CM | POA: Insufficient documentation

## 2020-03-12 DIAGNOSIS — I10 Essential (primary) hypertension: Secondary | ICD-10-CM | POA: Insufficient documentation

## 2020-03-12 DIAGNOSIS — R197 Diarrhea, unspecified: Secondary | ICD-10-CM

## 2020-03-12 DIAGNOSIS — R1084 Generalized abdominal pain: Secondary | ICD-10-CM | POA: Diagnosis not present

## 2020-03-12 DIAGNOSIS — I700 Atherosclerosis of aorta: Secondary | ICD-10-CM | POA: Insufficient documentation

## 2020-03-12 DIAGNOSIS — R109 Unspecified abdominal pain: Secondary | ICD-10-CM | POA: Diagnosis present

## 2020-03-12 LAB — COMPREHENSIVE METABOLIC PANEL
ALT: 22 U/L (ref 0–44)
AST: 30 U/L (ref 15–41)
Albumin: 4.6 g/dL (ref 3.5–5.0)
Alkaline Phosphatase: 48 U/L (ref 38–126)
Anion gap: 12 (ref 5–15)
BUN: 13 mg/dL (ref 8–23)
CO2: 24 mmol/L (ref 22–32)
Calcium: 8.9 mg/dL (ref 8.9–10.3)
Chloride: 99 mmol/L (ref 98–111)
Creatinine, Ser: 0.9 mg/dL (ref 0.44–1.00)
GFR, Estimated: >60 mL/min (ref >60)
Glucose, Bld: 121 mg/dL — ABNORMAL HIGH (ref 70–99)
Potassium: 4 mmol/L (ref 3.5–5.1)
Sodium: 135 mmol/L (ref 135–145)
Total Bilirubin: 0.6 mg/dL (ref 0.3–1.2)
Total Protein: 7.7 g/dL (ref 6.5–8.1)

## 2020-03-12 LAB — CBC
HCT: 41.9 % (ref 36.0–46.0)
Hemoglobin: 14 g/dL (ref 12.0–15.0)
MCH: 30.8 pg (ref 26.0–34.0)
MCHC: 33.4 g/dL (ref 30.0–36.0)
MCV: 92.3 fL (ref 80.0–100.0)
Platelets: 226 10*3/uL (ref 150–400)
RBC: 4.54 MIL/uL (ref 3.87–5.11)
RDW: 13.2 % (ref 11.5–15.5)
WBC: 8 10*3/uL (ref 4.0–10.5)
nRBC: 0 % (ref 0.0–0.2)

## 2020-03-12 LAB — URINALYSIS, ROUTINE W REFLEX MICROSCOPIC
Bilirubin Urine: NEGATIVE
Glucose, UA: NEGATIVE mg/dL
Ketones, ur: NEGATIVE mg/dL
Nitrite: NEGATIVE
Protein, ur: NEGATIVE mg/dL
Specific Gravity, Urine: 1.01 (ref 1.005–1.030)
pH: 6 (ref 5.0–8.0)

## 2020-03-12 LAB — URINALYSIS, MICROSCOPIC (REFLEX)

## 2020-03-12 LAB — LIPASE, BLOOD: Lipase: 31 U/L (ref 11–51)

## 2020-03-12 MED ORDER — IOHEXOL 300 MG/ML  SOLN
100.0000 mL | Freq: Once | INTRAMUSCULAR | Status: AC | PRN
  Administered 2020-03-13: 100 mL via INTRAVENOUS

## 2020-03-12 MED ORDER — SODIUM CHLORIDE 0.9 % IV BOLUS
500.0000 mL | Freq: Once | INTRAVENOUS | Status: AC
  Administered 2020-03-12: 500 mL via INTRAVENOUS

## 2020-03-12 MED ORDER — ONDANSETRON HCL 4 MG/2ML IJ SOLN
4.0000 mg | Freq: Once | INTRAMUSCULAR | Status: AC
  Administered 2020-03-12: 4 mg via INTRAVENOUS
  Filled 2020-03-12: qty 2

## 2020-03-12 NOTE — ED Notes (Signed)
Presents with abd pain, more specific the pain is worse at RLQ, denies having any fevers, nausea or vomiting, has been eating adequately, but states the diarrhea has been the biggest issue with her, all began yesterday.

## 2020-03-12 NOTE — ED Notes (Signed)
ED Provider at bedside. 

## 2020-03-12 NOTE — ED Triage Notes (Signed)
Diarrhea and abd pain for 2 days. No fevers, or n/v.

## 2020-03-13 DIAGNOSIS — K8020 Calculus of gallbladder without cholecystitis without obstruction: Secondary | ICD-10-CM | POA: Diagnosis not present

## 2020-03-13 DIAGNOSIS — I700 Atherosclerosis of aorta: Secondary | ICD-10-CM | POA: Diagnosis not present

## 2020-03-13 DIAGNOSIS — R1084 Generalized abdominal pain: Secondary | ICD-10-CM | POA: Diagnosis not present

## 2020-03-13 DIAGNOSIS — K6289 Other specified diseases of anus and rectum: Secondary | ICD-10-CM | POA: Diagnosis not present

## 2020-03-13 DIAGNOSIS — R197 Diarrhea, unspecified: Secondary | ICD-10-CM | POA: Diagnosis not present

## 2020-03-13 LAB — C DIFFICILE QUICK SCREEN W PCR REFLEX
C Diff antigen: POSITIVE — AB
C Diff toxin: NEGATIVE

## 2020-03-13 LAB — CLOSTRIDIUM DIFFICILE BY PCR, REFLEXED: Toxigenic C. Difficile by PCR: POSITIVE — AB

## 2020-03-13 MED ORDER — AMOXICILLIN-POT CLAVULANATE 875-125 MG PO TABS: 1.0000 | ORAL_TABLET | Freq: Two times a day (BID) | ORAL | 0 refills | Status: AC

## 2020-03-13 MED ORDER — ONDANSETRON 4 MG PO TBDP: 4.0000 mg | ORAL_TABLET | Freq: Three times a day (TID) | ORAL | 0 refills | Status: DC | PRN

## 2020-03-13 MED ORDER — AMOXICILLIN-POT CLAVULANATE 875-125 MG PO TABS
1.0000 | ORAL_TABLET | Freq: Once | ORAL | Status: AC
  Administered 2020-03-13: 1 via ORAL
  Filled 2020-03-13: qty 1

## 2020-03-13 NOTE — ED Provider Notes (Signed)
Emergency Department Provider Note   I have reviewed the triage vital signs and the nursing notes.   HISTORY  Chief Complaint Diarrhea and Abdominal Pain   HPI Shannon Cummings is a 74 y.o. female with past medical history reviewed below presents to the emergency department with cramping abdominal pain now worse in the right abdomen with associated diarrhea.  Diarrhea is nonbloody and frequent.  She denies fevers or shaking chills.  No recent antibiotics.  Began having symptoms yesterday.  Her abdominal cramping was more diffuse but has since developed some more focal right-sided abdominal discomfort in the mid abdomen.  No chest pain, URI symptoms, shortness of breath.  No similar symptoms in the past.  No international travel or sick contacts.    Past Medical History:  Diagnosis Date  . Arthritis   . CAD (coronary artery disease)    a. prior stenting history. b. NSTEMI s/p DES to prox LAD with EF 45% by cath 08/14/12. c. Mild trop elevation shortly after cath ?mild plaque embolization - patent stent on relook. // d. Myoview 8/18: EF 60, normal perfusion; Low Risk. e. NSTEMI 06/2018- patent prior LAD stent, 80% OM1 s/p DES, normal LVEDP, EF 45-50%, moderate residual disease treated medically.  . Fatty infiltration of liver   . GERD (gastroesophageal reflux disease)   . Hyperlipidemia   . Hypertension   . Hypokalemia   . Hypotension   . Myocardial infarction ([place]) 2014  . OSA on CPAP   . Osteoporosis   . PAF (paroxysmal atrial fibrillation) ([place])    On rythmol previously  . Pulmonary nodule    a. 65mm subpleural nodular density CT 08/2012, instructed to f/u pulm MD.  . Sinus bradycardia   . Tubular adenoma of colon 2007    Patient Active Problem List   Diagnosis Date Noted  . COVID-19 05/13/2019  . Unstable angina ([employer]) 05/13/2019  . Sinus bradycardia 06/15/2018  . Ischemic cardiomyopathy 06/15/2018  . NSTEMI (non-ST elevated myocardial infarction) ([place]) 06/13/2018  . GERD  (gastroesophageal reflux disease) 07/10/2016  . History of colonic polyps 02/25/2016  . Antiplatelet or antithrombotic Siona Coulston-term use 02/25/2016  . Nodule of right lung 10/13/2012  . History of non-ST elevation myocardial infarction (NSTEMI) 08/14/2012  . Hepatic steatosis 12/03/2011  . Paroxysmal atrial fibrillation ([place]) 12/22/2010  . CAD (coronary artery disease) 12/22/2010  . INSOMNIA 04/13/2010  . Hyperlipidemia 03/19/2010  . Essential hypertension 03/19/2010  . Obstructive sleep apnea 03/17/2010    Past Surgical History:  Procedure Laterality Date  . CORONARY ANGIOPLASTY WITH STENT PLACEMENT  08/14/2012   LAD  Dr [name]  . CORONARY STENT INTERVENTION N/A 06/14/2018   Procedure: CORONARY STENT INTERVENTION;  Surgeon: Jettie Booze, MD;  Location: [facility];  Service: Cardiovascular;  Laterality: N/A;  . CORONARY STENT PLACEMENT  2000  . hair restoration  02/2017  . LEFT HEART CATH AND CORONARY ANGIOGRAPHY N/A 06/14/2018   Procedure: LEFT HEART CATH AND CORONARY ANGIOGRAPHY;  Surgeon: Jettie Booze, MD;  Location: [facility];  Service: Cardiovascular;  Laterality: N/A;  . LEFT HEART CATH AND CORONARY ANGIOGRAPHY N/A 05/14/2019   Procedure: LEFT HEART CATH AND CORONARY ANGIOGRAPHY;  Surgeon: [name], MD;  Location: [facility];  Service: Cardiovascular;  Laterality: N/A;  . LEFT HEART CATHETERIZATION WITH CORONARY ANGIOGRAM N/A 08/14/2012   Procedure: LEFT HEART CATHETERIZATION WITH CORONARY ANGIOGRAM;  Surgeon: [name], MD;  [facility];  Service: Cardiovascular;  Laterality: N/A;  .  LEFT HEART CATHETERIZATION WITH CORONARY ANGIOGRAM N/A 08/19/2012   Procedure: LEFT HEART CATHETERIZATION WITH CORONARY ANGIOGRAM;  Surgeon: [name], MD;  Location: [facility] CATH LAB;  Service: Cardiovascular;  Laterality: N/A;  . ROTATOR CUFF REPAIR    . TONSILLECTOMY    . TOTAL ABDOMINAL HYSTERECTOMY      Allergies Effexor  [venlafaxine], Flomax [tamsulosin], Isosorbide, Macrobid [nitrofurantoin], Other, Statins, and Zetia [ezetimibe]  Family History  Problem Relation Age of Onset  . Heart disease Father   . Alzheimer's disease Mother   . Breast cancer Maternal Aunt   . Colon cancer Neg Hx     Social History Social History   Tobacco Use  . Smoking status: Never Smoker  . Smokeless tobacco: Never Used  Vaping Use  . Vaping Use: Never used  Substance Use Topics  . Alcohol use: Yes    Comment: 2 drinks daily   . Drug use: No    Review of Systems  Constitutional: No fever/chills Eyes: No visual changes. ENT: No sore throat. Cardiovascular: Denies chest pain. Respiratory: Denies shortness of breath. Gastrointestinal: Positive diffuse to right sided abdominal pain. Occasional nausea, no vomiting. Positive diarrhea.  No constipation. Genitourinary: Negative for dysuria. Musculoskeletal: Negative for back pain. Skin: Negative for rash. Neurological: Negative for headaches, focal weakness or numbness.  10-point ROS otherwise negative.  ____________________________________________   PHYSICAL EXAM:  VITAL SIGNS: ED Triage Vitals [03/12/20 2228]  Enc Vitals Group     BP (!) 150/92     Pulse Rate 66     Resp 20     Temp 98.5 F (36.9 C)     Temp Source Oral     SpO2 100 %     Weight 138 lb 14.2 oz (63 kg)   Constitutional: Alert and oriented. Well appearing and in no acute distress. Eyes: Conjunctivae are normal.  Head: Atraumatic. Nose: No congestion/rhinnorhea. Mouth/Throat: Mucous membranes are moist. Neck: No stridor.   Cardiovascular: Normal rate, regular rhythm. Good peripheral circulation. Grossly normal heart sounds.   Respiratory: Normal respiratory effort.  No retractions. Lungs CTAB. Gastrointestinal: Soft with mild right mid-abdominal tenderness. No rebound or guarding. No distention.  Musculoskeletal: No gross deformities of extremities. Neurologic:  Normal speech and  language. Skin:  Skin is warm, dry and intact. No rash noted.  ____________________________________________   LABS (all labs ordered are listed, but only abnormal results are displayed)  Labs Reviewed  COMPREHENSIVE METABOLIC PANEL - Abnormal; Notable for the following components:      Result Value   Glucose, Bld 121 (*)    All other components within normal limits  URINALYSIS, ROUTINE W REFLEX MICROSCOPIC - Abnormal; Notable for the following components:   Hgb urine dipstick TRACE (*)    Leukocytes,Ua TRACE (*)    All other components within normal limits  URINALYSIS, MICROSCOPIC (REFLEX) - Abnormal; Notable for the following components:   Bacteria, UA RARE (*)    All other components within normal limits  GASTROINTESTINAL PANEL BY PCR, STOOL (REPLACES STOOL CULTURE)  C DIFFICILE QUICK SCREEN W PCR REFLEX  LIPASE, BLOOD  CBC   ____________________________________________  RADIOLOGY  CT ABDOMEN PELVIS W CONTRAST  Result Date: 03/13/2020 CLINICAL DATA:  Diarrhea, abdominal pain, diverticulitis EXAM: CT ABDOMEN AND PELVIS WITH CONTRAST TECHNIQUE: Multidetector CT imaging of the abdomen and pelvis was performed using the standard protocol following bolus administration of intravenous contrast. CONTRAST:  147mL OMNIPAQUE IOHEXOL 300 MG/ML  SOLN COMPARISON:  None. FINDINGS: Lower chest: The visualized lung bases are clear  bilaterally. The visualized heart and pericardium are unremarkable. Small hiatal hernia present. Hepatobiliary: Cholelithiasis noted without pericholecystic inflammatory change identified. Prominent mucosal enhancement of the gallbladder likely relates to phase of contrast enhancement. Tiny cyst noted within the gallbladder fossa. The liver is otherwise unremarkable. No intra or extrahepatic biliary ductal dilation. Pancreas: Unremarkable Spleen: Unremarkable Adrenals/Urinary Tract: Adrenal glands are unremarkable. Kidneys are normal, without renal calculi, focal lesion,  or hydronephrosis. Bladder is unremarkable. Stomach/Bowel: There is circumferential wall thickening and mild perirectal inflammatory stranding in keeping with changes of infectious or inflammatory proctitis. No evidence of obstruction or perforation. The stomach, small bowel, and large bowel are otherwise unremarkable. Appendix normal. No free intraperitoneal gas or fluid. Vascular/Lymphatic: Mild aortoiliac atherosclerotic calcification is present without evidence of aneurysm. Circumaortic left renal vein noted. No pathologic adenopathy within the abdomen and pelvis. Reproductive: Status post hysterectomy. No adnexal masses. Other: None significant Musculoskeletal: No acute bone abnormality. Osseous structures are age-appropriate. IMPRESSION: Infectious or inflammatory proctitis without evidence of obstruction or perforation. Cholelithiasis. Aortic Atherosclerosis (ICD10-I70.0). Electronically Signed   By: Fidela Salisbury MD   On: 03/13/2020 01:20    ____________________________________________   PROCEDURES  Procedure(s) performed:   Procedures  None  ____________________________________________   INITIAL IMPRESSION / ASSESSMENT AND PLAN / ED COURSE  Pertinent labs & imaging results that were available during my care of the patient were reviewed by me and considered in my medical decision making (see chart for details).   Patient presents emergency department abdominal cramping and diarrhea for the past 24 hours.  Overall low risk for C. difficile with no recent antibiotics or hospitalizations.  She has mild tenderness in the right abdomen.  Plan for CT imaging along with lab work fluids. No URI or COVID symptoms. Can send stool panel if patient able to provide a stool sample in the ED.   CT imaging reviewed showing proctitis without perforation or abscess. Remainder of the scan is largely unremarkable. Patient was able to provide a stool sample in the ED and will send this for stool panel as  well as C. difficile testing. Patient is followed by low our gastroenterology and will call the office on Monday to discuss colonoscopy. She states that she is due for one and that this was delayed given her anticoagulation as well as COVID pandemic. Plan to start Augmentin here. Patient also given prescription for Zofran to her Imodium. No significant electrolyte abnormality on labs. No acute kidney injury. Patient is afebrile and overall well-appearing here. I do not see an indication for additional emergency testing or admission at this time. Discussed strict ED return precautions in detail and patient is feeling comfortable with the plan at discharge. ____________________________________________  FINAL CLINICAL IMPRESSION(S) / ED DIAGNOSES  Final diagnoses:  Diarrhea of presumed infectious origin  Proctitis  Generalized abdominal pain     MEDICATIONS GIVEN DURING THIS VISIT:  Medications  sodium chloride 0.9 % bolus 500 mL ( Intravenous Stopped 03/13/20 0042)  ondansetron (ZOFRAN) injection 4 mg (4 mg Intravenous Given 03/12/20 2325)  iohexol (OMNIPAQUE) 300 MG/ML solution 100 mL (100 mLs Intravenous Contrast Given 03/13/20 0044)  amoxicillin-clavulanate (AUGMENTIN) 875-125 MG per tablet 1 tablet (1 tablet Oral Given 03/13/20 0144)     NEW OUTPATIENT MEDICATIONS STARTED DURING THIS VISIT:  Discharge Medication List as of 03/13/2020  1:28 AM    START taking these medications   Details  amoxicillin-clavulanate (AUGMENTIN) 875-125 MG tablet Take 1 tablet by mouth every 12 (twelve) hours for 7 days., Starting  Sat 03/13/2020, Until Sat 03/20/2020, Normal    ondansetron (ZOFRAN ODT) 4 MG disintegrating tablet Take 1 tablet (4 mg total) by mouth every 8 (eight) hours as needed., Starting Sat 03/13/2020, Normal        Note:  This document was prepared using Dragon voice recognition software and may include unintentional dictation errors.  Nanda Quinton, MD, [facility] Emergency [facility]      Demari Gales, [facility], MD 03/13/20 [phone]

## 2020-03-13 NOTE — Discharge Instructions (Signed)
You were seen in the emergency room today with diarrhea and abdominal pain.  Your lab work is reassuring but your CT scan shows some inflammation in the colon and rectal area.  This may be caused by infection and I am starting you on antibiotics.  This should help your symptoms over the next several days.  Please complete the course of antibiotics and take Zofran as needed for nausea.  You can continue taking the Imodium as directed on the box.  Please follow closely with your primary care doctor.  I also listed the name of a gastroenterologist.  Please call the office to schedule an appointment.  Your primary care doctor may need to provide a referral depending on your insurance.  They should evaluate you after your symptoms subside and decide if colonoscopy is needed in the near future.   Drink plenty of fluids and return to the emergency department any fever, worsening pain, new/severe symptoms.

## 2020-03-13 NOTE — ED Notes (Signed)
Back from restroom, pt states had a mod amt of very watery stool with some abd cramping.

## 2020-03-13 NOTE — ED Notes (Signed)
ED Provider at bedside. 

## 2020-03-14 ENCOUNTER — Telehealth (HOSPITAL_BASED_OUTPATIENT_CLINIC_OR_DEPARTMENT_OTHER): Payer: Self-pay | Admitting: Emergency Medicine

## 2020-03-14 LAB — GASTROINTESTINAL PANEL BY PCR, STOOL (REPLACES STOOL CULTURE)

## 2020-03-14 MED ORDER — SULFAMETHOXAZOLE-TRIMETHOPRIM 800-160 MG PO TABS: 1.0000 | ORAL_TABLET | Freq: Two times a day (BID) | ORAL | 0 refills | Status: AC

## 2020-03-14 MED ORDER — VANCOMYCIN 50 MG/ML ORAL SOLUTION: 125.0000 mg | Freq: Four times a day (QID) | ORAL | 0 refills | Status: DC

## 2020-03-14 NOTE — Telephone Encounter (Signed)
2:32 PM Was given documentation the patient has now tested positive for enteropathogenic E. coli as well as C. difficile on her work-up yesterday.  I spoke with pharmacy and they requested we have the patient discontinue Augmentin which she was initially prescribed and instead take vancomycin 125 mg 4 times daily for 14 days and Bactrim twice daily for 5 days.  I discussed with patient and she agrees with making this change.  We will call in the antibiotics for her.  Anticipate she will follow-up with her PCP and return if any symptoms change or worsen.

## 2020-03-19 ENCOUNTER — Other Ambulatory Visit: Payer: Self-pay | Admitting: Cardiovascular Disease

## 2020-03-19 MED ORDER — SPIRONOLACTONE 25 MG PO TABS: ORAL_TABLET | ORAL | 0 refills | Status: DC

## 2020-03-19 NOTE — Addendum Note (Signed)
Addended by: Gaetano Net on: 03/19/2020 04:48 PM   Modules accepted: Orders

## 2020-03-22 ENCOUNTER — Other Ambulatory Visit: Payer: Self-pay | Admitting: Cardiovascular Disease

## 2020-03-22 MED ORDER — SPIRONOLACTONE 25 MG PO TABS: ORAL_TABLET | ORAL | 1 refills | Status: DC

## 2020-03-22 NOTE — Telephone Encounter (Signed)
I sent Rx in for Spironolactone # 90 x 1 refill. Pt past due for her 6 month follow though has appt 04/26/20. Pt was recently seen earlier this year 08/2019.

## 2020-03-22 NOTE — Telephone Encounter (Signed)
*  STAT* If patient is at the pharmacy, call can be transferred to refill team.   1. Which medications need to be refilled? (please list name of each medication and dose if known) spironolactone (ALDACTONE) 25 MG tablet [[id number]]  2  2. [id number] [place] [place] - [address]  [address] [place], [place] [address]  Phone:  [phone] Fax:  [phone]  and city if local pharmacy) is medication to be sent to?   3. Do they need a 30 day or 90 day supply? 30   Pt would like to Dr Sharrell Ku only and would like to know if she can get enough of this med to make it to her appt 11/22 ?    Best number [phone]

## 2020-03-23 ENCOUNTER — Other Ambulatory Visit: Payer: Self-pay

## 2020-03-23 ENCOUNTER — Ambulatory Visit (INDEPENDENT_AMBULATORY_CARE_PROVIDER_SITE_OTHER): Payer: Medicare Other | Admitting: Physician Assistant

## 2020-03-23 ENCOUNTER — Encounter: Payer: Self-pay | Admitting: Physician Assistant

## 2020-03-23 VITALS — BP 120/78 | HR 60 | Temp 98.2°F | Resp 16 | Ht 60.0 in | Wt 141.0 lb

## 2020-03-23 DIAGNOSIS — R300 Dysuria: Secondary | ICD-10-CM | POA: Diagnosis not present

## 2020-03-23 DIAGNOSIS — I25119 Atherosclerotic heart disease of native coronary artery with unspecified angina pectoris: Secondary | ICD-10-CM | POA: Diagnosis not present

## 2020-03-23 LAB — POCT URINALYSIS DIPSTICK
Bilirubin, UA: NEGATIVE
Glucose, UA: NEGATIVE
Ketones, UA: NEGATIVE
Nitrite, UA: NEGATIVE
Protein, UA: POSITIVE — AB
Spec Grav, UA: 1.015 (ref 1.010–1.025)
Urobilinogen, UA: 0.2 E.U./dL
pH, UA: 6 (ref 5.0–8.0)

## 2020-03-23 MED ORDER — NEXLETOL 180 MG PO TABS: 1.0000 | ORAL_TABLET | Freq: Every day | ORAL | 1 refills | Status: DC

## 2020-03-23 NOTE — Patient Instructions (Signed)
Your symptoms are consistent with a bladder infection, also called acute cystitis. Giving your recent bout of c diff and concern of recurrence with antibiotic overuse, we will wait for culture results before starting antibiotic.    Stay very well hydrated.  Consider a daily probiotic (Align, Culturelle, or Activia) to help prevent stomach upset caused by the antibiotic.  Taking a probiotic daily may also help prevent recurrent UTIs.  Also consider taking AZO (Phenazopyridine) tablets to help decrease pain with urination.  I will call you with your urine testing results.  We will start/change antibiotics if indicated.  Call or return to clinic if symptoms are not resolved by completion of antibiotic.   Urinary Tract Infection A urinary tract infection (UTI) can occur any place along the urinary tract. The tract includes the kidneys, ureters, bladder, and urethra. A type of germ called bacteria often causes a UTI. UTIs are often helped with antibiotic medicine.  HOME CARE   If given, take antibiotics as told by your doctor. Finish them even if you start to feel better.  Drink enough fluids to keep your pee (urine) clear or pale yellow.  Avoid tea, drinks with caffeine, and bubbly (carbonated) drinks.  Pee often. Avoid holding your pee in for a long time.  Pee before and after having sex (intercourse).  Wipe from front to back after you poop (bowel movement) if you are a woman. Use each tissue only once. GET HELP RIGHT AWAY IF:   You have back pain.  You have lower belly (abdominal) pain.  You have chills.  You feel sick to your stomach (nauseous).  You throw up (vomit).  Your burning or discomfort with peeing does not go away.  You have a fever.  Your symptoms are not better in 3 days. MAKE SURE YOU:   Understand these instructions.  Will watch your condition.  Will get help right away if you are not doing well or get worse. Document Released: 11/08/2007 Document Revised:  02/14/2012 Document Reviewed: 12/21/2011 [facility] Patient Information 2015 [place], [place]. This information is not intended to replace advice given to you by your health care provider. Make sure you discuss any questions you have with your health care provider.

## 2020-03-23 NOTE — Progress Notes (Signed)
Patient presents to clinic today c/o 1 day of urinary urgency, frequency, dysuria and episode of hematuria. Denies fever, chills. Notes very slight low back pain. Denies abdominal pain. Denies vaginal symptoms. Of not she just completed two course of antibiotic for c difficile.     Past Medical History:  Diagnosis Date   Arthritis    CAD (coronary artery disease)    a. prior stenting history. b. NSTEMI s/p DES to prox LAD with EF 45% by cath 08/14/12. c. Mild trop elevation shortly after cath ?mild plaque embolization - patent stent on relook. // d. Myoview 8/18: EF 60, normal perfusion; Low Risk. e. NSTEMI 06/2018- patent prior LAD stent, 80% OM1 s/p DES, normal LVEDP, EF 45-50%, moderate residual disease treated medically.   Fatty infiltration of liver    GERD (gastroesophageal reflux disease)    Hyperlipidemia    Hypertension    Hypokalemia    Hypotension    Myocardial infarction ([place]) 2014   OSA on CPAP    Osteoporosis    PAF (paroxysmal atrial fibrillation) ([facility])    On rythmol previously   Pulmonary nodule    a. 76mm subpleural nodular density CT 08/2012, instructed to f/u pulm MD.   Sinus bradycardia    Tubular adenoma of colon 2007    Current Outpatient Medications on File Prior to Visit  Medication Sig Dispense Refill   acetaminophen (TYLENOL) 650 MG CR tablet Take 650 mg by mouth every 8 (eight) hours as needed for pain.     Alirocumab (PRALUENT) 75 MG/ML SOAJ Inject 75 mg into the skin every 14 (fourteen) days. 2 pen 11   amiodarone (PACERONE) 100 MG tablet Take 1 tablet (100 mg total) by mouth daily. 90 tablet 3   amLODipine (NORVASC) 2.5 MG tablet Take 1 tablet (2.5 mg total) by mouth daily. 30 tablet 11   apixaban (ELIQUIS) 5 MG TABS tablet TAKE 1 TABLET(5 MG) BY MOUTH TWICE DAILY 180 tablet 1   Cholecalciferol (VITAMIN D3) 25 MCG (1000 UT) CAPS Take 1,000 Units by mouth daily.     clopidogrel (PLAVIX) 75 MG tablet Take 1 tablet (75 mg total) by  mouth daily. 90 tablet 3   Cyanocobalamin (B-12) 1000 MCG LOZG Take 1,000 mcg by mouth daily.      fluticasone (FLONASE) 50 MCG/ACT nasal spray Place 2 sprays into both nostrils daily as needed for allergies or rhinitis. 48 g 1   losartan (COZAAR) 25 MG tablet Take 2 tablets (50 mg total) by mouth daily. 60 tablet 10   nitroGLYCERIN (NITROSTAT) 0.4 MG SL tablet Place 1 tablet (0.4 mg total) under the tongue every 5 (five) minutes x 3 doses as needed for chest pain. 25 tablet 3   ondansetron (ZOFRAN ODT) 4 MG disintegrating tablet Take 1 tablet (4 mg total) by mouth every 8 (eight) hours as needed. 20 tablet 0   pantoprazole (PROTONIX) 40 MG tablet TAKE 1 TABLET(40 MG) BY MOUTH DAILY 90 tablet 1   potassium chloride (KLOR-CON) 10 MEQ tablet TAKE 1 TABLET(10 MEQ) BY MOUTH DAILY 90 tablet 2   propranolol (INDERAL) 10 MG tablet Take 10 mg by mouth 4 (four) times daily as needed (for A Fib Attacks).     spironolactone (ALDACTONE) 25 MG tablet TAKE 1/2 TABLET(12.5 MG) BY MOUTH DAILY 90 tablet 1   No current facility-administered medications on file prior to visit.    Allergies  Allergen Reactions   Effexor [Venlafaxine] Other (See Comments)    CAUSED TOO MUCH SEDATION; CANNOT  TOLERATE   Flomax [Tamsulosin] Other (See Comments)    Caused rapid HYPOTENSION- patient came very close to fainting (also caused weakness and vertigo)   Isosorbide     Headache   Macrobid [Nitrofurantoin] Diarrhea, Nausea Only and Other (See Comments)    Severe abd pain    Other Anxiety and Other (See Comments)    Intolerance to strong pain medications=  Make her feel anxious and feel like coming out of her skin   Statins Other (See Comments)    Restless legs, bad feeling.  This has occurred with Crestor 5 mg once weekly, Lipitor 10 mg twice weekly, and Simvastatin daily   Zetia [Ezetimibe] Other (See Comments)    Made the legs ache    Family History  Problem Relation Age of Onset   Heart disease  Father    Alzheimer's disease Mother    Breast cancer Maternal Aunt    Colon cancer Neg Hx     Social History   Socioeconomic History   Marital status: Married    Spouse name: Not on file   Number of children: 2   Years of education: Not on file   Highest education level: Not on file  Occupational History   Occupation: Retired    Fish farm manager: [employer]     Comment: 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  Tobacco Use   Smoking status: Never Smoker   Smokeless tobacco: Never Used  Scientific laboratory technician Use: Never used  Substance and Sexual Activity   Alcohol use: Yes    Comment: 2 drinks daily    Drug use: No   Sexual activity: Not Currently  Other Topics Concern   Not on file  Social History Narrative   Daily caffeine    Lives in [place]   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   Social Determinants of Radio broadcast assistant Strain:    Difficulty of Paying Living Expenses: Not on file  Food Insecurity:    Worried About Charity fundraiser in the Last Year: Not on file   [place] in the Last Year: Not on file  Transportation Needs:    Lack of Transportation (Medical): Not on file   Lack of Transportation (Non-Medical): Not on file  Physical Activity:    Days of Exercise per Week: Not on file   Minutes of Exercise per Session: Not on file  Stress:    Feeling of Stress : Not on file  Social Connections:    Frequency of Communication with Friends and Family: Not on file   Frequency of Social Gatherings with Friends and Family: Not on file   Attends Religious Services: Not on file   Active Member of Clubs or Organizations: Not on file   Attends Archivist Meetings: Not on file   Marital Status: Not on file    Review of Systems - See HPI.  All other ROS are negative.  BP 120/78    Pulse 60    Temp 98.2 F (36.8 C) (Temporal)    Resp 16    Ht 5' (1.524 m)    Wt 141 lb (64 kg)    SpO2 97%    BMI 27.54 kg/m    Physical Exam  Recent Results (from the past 2160 hour(s))  Urinalysis, Routine w reflex microscopic     Status: Abnormal   Collection Time: 03/12/20 10:37 PM  Result Value Ref Range   Color, Urine YELLOW YELLOW  APPearance CLEAR CLEAR   Specific Gravity, Urine 1.010 1.005 - 1.030   pH 6.0 5.0 - 8.0   Glucose, UA NEGATIVE NEGATIVE mg/dL   Hgb urine dipstick TRACE (A) NEGATIVE   Bilirubin Urine NEGATIVE NEGATIVE   Ketones, ur NEGATIVE NEGATIVE mg/dL   Protein, ur NEGATIVE NEGATIVE mg/dL   Nitrite NEGATIVE NEGATIVE   Leukocytes,Ua TRACE (A) NEGATIVE    Comment: Performed at [facility], [address] [place], [place] [address]  Urinalysis, Microscopic (reflex)     Status: Abnormal   Collection Time: 03/12/20 10:37 PM  Result Value Ref Range   RBC / HPF 0-5 0 - 5 RBC/hpf   WBC, UA 0-5 0 - 5 WBC/hpf   Bacteria, UA RARE (A) NONE SEEN   Squamous Epithelial / LPF 0-5 0 - 5    Comment: Performed at [facility], [address] [place], [place] [address]  Lipase, blood     Status: None   Collection Time: 03/12/20 11:10 PM  Result Value Ref Range   Lipase 31 11 - 51 U/L    Comment: Performed at [facility], [address] [place], [place] [address]  Comprehensive metabolic panel     Status: Abnormal   Collection Time: 03/12/20 11:10 PM  Result Value Ref Range   Sodium 135 135 - 145 mmol/L   Potassium 4.0 3.5 - 5.1 mmol/L   Chloride 99 98 - 111 mmol/L   CO2 24 22 - 32 mmol/L   Glucose, Bld 121 (H) 70 - 99 mg/dL    Comment: Glucose reference range applies only to samples taken after fasting for at least 8 hours.   BUN 13 8 - 23 mg/dL   Creatinine, Ser 0.90 0.44 - 1.00 mg/dL   Calcium 8.9 8.9 - 10.3 mg/dL   Total Protein 7.7 6.5 - 8.1 g/dL   Albumin 4.6 3.5 - 5.0 g/dL   AST 30 15 - 41 U/L   ALT 22 0 - 44 U/L   Alkaline Phosphatase 48 38 - 126 U/L   Total Bilirubin 0.6 0.3 - 1.2 mg/dL   GFR, Estimated >60 >60 mL/min   Anion gap 12 5 - 15     Comment: Performed at [facility], [address] [place], [place] [address]  CBC     Status: None   Collection Time: 03/12/20 11:10 PM  Result Value Ref Range   WBC 8.0 4.0 - 10.5 K/uL   RBC 4.54 3.87 - 5.11 MIL/uL   Hemoglobin 14.0 12.0 - 15.0 g/dL   HCT 41.9 36 - 46 %   MCV 92.3 80.0 - 100.0 fL   MCH 30.8 26.0 - 34.0 pg   MCHC 33.4 30.0 - 36.0 g/dL   RDW 13.2 11.5 - 15.5 %   Platelets 226 150 - 400 K/uL   nRBC 0.0 0.0 - 0.2 %    Comment: Performed at [facility], [address] [place], [place] [address]  Gastrointestinal Panel by PCR , Stool     Status: Abnormal   Collection Time: 03/13/20  1:39 AM   Specimen: Stool  Result Value Ref Range   Campylobacter species NOT DETECTED NOT DETECTED   Plesimonas shigelloides NOT DETECTED NOT DETECTED   Salmonella species NOT DETECTED NOT DETECTED   Yersinia enterocolitica NOT DETECTED NOT DETECTED   Vibrio species NOT DETECTED NOT DETECTED   Vibrio cholerae NOT DETECTED NOT DETECTED   Enteroaggregative E  coli (EAEC) NOT DETECTED NOT DETECTED   Enteropathogenic E coli (EPEC) DETECTED (A) NOT DETECTED    Comment: RESULT CALLED TO, READ BACK BY AND VERIFIED WITH: CINDY REED RN AT [id number] ON 03/14/20 SNG     Enterotoxigenic E coli (ETEC) NOT DETECTED NOT DETECTED   Shiga like toxin producing E coli (STEC) NOT DETECTED NOT DETECTED   Shigella/Enteroinvasive E coli (EIEC) NOT DETECTED NOT DETECTED   Cryptosporidium NOT DETECTED NOT DETECTED   Cyclospora cayetanensis NOT DETECTED NOT DETECTED   Entamoeba histolytica NOT DETECTED NOT DETECTED   Giardia lamblia NOT DETECTED NOT DETECTED   Adenovirus F40/41 NOT DETECTED NOT DETECTED   Astrovirus NOT DETECTED NOT DETECTED   Norovirus GI/GII NOT DETECTED NOT DETECTED   Rotavirus A NOT DETECTED NOT DETECTED   Sapovirus (I, II, IV, and V) NOT DETECTED NOT DETECTED    Comment: Performed at [facility], [address] [place], [place] [address]  C Difficile  Quick Screen w PCR reflex     Status: Abnormal   Collection Time: 03/13/20  1:39 AM   Specimen: Stool  Result Value Ref Range   C Diff antigen POSITIVE (A) NEGATIVE   C Diff toxin NEGATIVE NEGATIVE   C Diff interpretation Results are indeterminate. See PCR results.     Comment: Performed at [facility], [place] [address] [place], [place] [address]  C. Diff by PCR, Reflexed     Status: Abnormal   Collection Time: 03/13/20  1:39 AM  Result Value Ref Range   Toxigenic C. Difficile by PCR POSITIVE (A) NEGATIVE    Comment: Positive for toxigenic C. difficile with little to no toxin production. Only treat if clinical presentation suggests symptomatic illness. Performed at [facility], [place] [address] [place],  [address]   POCT Urinalysis Dipstick     Status: Abnormal   Collection Time: 03/23/20 11:39 AM  Result Value Ref Range   Color, UA dark yellow    Clarity, UA cloudy    Glucose, UA Negative Negative   Bilirubin, UA Negative    Ketones, UA Negative    Spec Grav, UA 1.015 1.010 - 1.025   Blood, UA 3+    pH, UA 6.0 5.0 - 8.0   Protein, UA Positive (A) Negative   Urobilinogen, UA 0.2 0.2 or 1.0 E.U./dL   Nitrite, UA Negative    Leukocytes, UA Moderate (2+) (A) Negative   Appearance     Odor      Assessment/Plan: 1. Dysuria Classic UTI symptoms. UA with blood and LE. Discussed empiric treatment but giving recent c diff infection she wants to await culture to make sure she is on the right antibiotic and not taking anything more than needed. I agree with this. Will have her increase fluids. Start AZO, probiotic and cranberry supplement. Will send in script based on culture results.  - POCT Urinalysis Dipstick - Urine Culture  This visit occurred during the SARS-CoV-2 public health emergency.  Safety protocols were in place, including screening questions prior to the visit, additional usage of staff PPE, and extensive cleaning of exam room while observing appropriate  contact time as indicated for disinfecting solutions.     Leeanne Rio, PA-C

## 2020-03-24 LAB — URINE CULTURE
MICRO NUMBER:: 11090627
SPECIMEN QUALITY:: ADEQUATE

## 2020-03-25 ENCOUNTER — Other Ambulatory Visit: Payer: Self-pay | Admitting: Emergency Medicine

## 2020-03-25 ENCOUNTER — Telehealth: Payer: Self-pay | Admitting: Family Medicine

## 2020-03-25 DIAGNOSIS — N390 Urinary tract infection, site not specified: Secondary | ICD-10-CM

## 2020-03-25 MED ORDER — CEPHALEXIN 500 MG PO CAPS: 500.0000 mg | ORAL_CAPSULE | Freq: Two times a day (BID) | ORAL | 0 refills | Status: AC

## 2020-03-25 NOTE — Telephone Encounter (Signed)
Patient called stating that she looked at her results on My Chart - and noticed that it said they were contaminated - patient is experiencing a lot of pain and wants to know if something can be called in.  I spoke with Einar Pheasant because he was up front and he said that Shannon Cummings was going to call patient - told this to patient - she voiced understanding.

## 2020-03-25 NOTE — Telephone Encounter (Signed)
Spoke with patient of urine culture results. Due to patient having symptoms and pain, Shannon Cummings has started Keflex 500 mg bid x 7 days. Continue her probiotic daily. Will recheck urine culture after completion of abx. Patient is agreeable.

## 2020-04-01 DIAGNOSIS — Z23 Encounter for immunization: Secondary | ICD-10-CM | POA: Diagnosis not present

## 2020-04-05 ENCOUNTER — Other Ambulatory Visit: Payer: Self-pay | Admitting: Internal Medicine

## 2020-04-05 NOTE — Telephone Encounter (Signed)
Eliquis 5mg  refill request received. Patient is 74 years old, weight-64kg, Crea-0.90 on 03/12/2020, Diagnosis-Afib, and last seen by Richardson Dopp on 09/01/2019. Dose is appropriate based on dosing criteria. Will send in refill to requested pharmacy.

## 2020-04-08 ENCOUNTER — Telehealth (INDEPENDENT_AMBULATORY_CARE_PROVIDER_SITE_OTHER): Payer: Medicare Other | Admitting: Family Medicine

## 2020-04-08 ENCOUNTER — Encounter: Payer: Self-pay | Admitting: Family Medicine

## 2020-04-08 DIAGNOSIS — R1030 Lower abdominal pain, unspecified: Secondary | ICD-10-CM

## 2020-04-08 DIAGNOSIS — I25119 Atherosclerotic heart disease of native coronary artery with unspecified angina pectoris: Secondary | ICD-10-CM | POA: Diagnosis not present

## 2020-04-08 MED ORDER — DICYCLOMINE HCL 20 MG PO TABS: ORAL_TABLET | ORAL | 0 refills | Status: DC

## 2020-04-08 MED ORDER — SACCHAROMYCES BOULARDII 250 MG PO CAPS: 250.0000 mg | ORAL_CAPSULE | Freq: Two times a day (BID) | ORAL | 0 refills | Status: DC

## 2020-04-08 MED ORDER — FIDAXOMICIN 200 MG PO TABS: 200.0000 mg | ORAL_TABLET | Freq: Two times a day (BID) | ORAL | 0 refills | Status: DC

## 2020-04-08 NOTE — Progress Notes (Signed)
I connected with  Shannon Cummings on 04/08/20 by a video enabled telemedicine application and verified that I am speaking with the correct person using two identifiers.   I discussed the limitations of evaluation and management by telemedicine. The patient expressed understanding and agreed to proceed.

## 2020-04-08 NOTE — Addendum Note (Signed)
Addended by: [name] on: 04/08/2020 03:26 PM   Modules accepted: Orders

## 2020-04-08 NOTE — Progress Notes (Addendum)
Virtual Visit via Video   I connected with patient on 04/08/20 at 11:30 AM EDT by a video enabled telemedicine application and verified that I am speaking with the correct person using two identifiers.  Location patient: Home Location provider: [facility], Office Persons participating in the virtual visit: Patient, Provider, [place] (Sabrina M)  I discussed the limitations of evaluation and management by telemedicine and the availability of in person appointments. The patient expressed understanding and agreed to proceed.  Subjective:   HPI:   ER f/u- pt went to ER on 10/8 and was dx'd w/ infectious proctitis.  Started on Augmentin.  She was found to have C diff and Enteropathic E Coli.  Her Augmentin was stopped and she was to take Vanc 125mg  QID x14 days and Bactrim BID x5 days.  She subsequently developed UTI sxs and was treated w/ Keflex.  Today she reports 'severe stomach pains', chills, low grade fever that started yesterday afternoon.  sxs started after going out for lunch.  Sxs are lower abdomen.  No diarrhea.  Had booster shot 1 week ago.  + nausea, no vomiting.  No urinary sxs currently- no frequency, urgency, dysuria.  Not having any respiratory sxs- no cough, congestion.  No known sick contacts.  Sees Dr Fuller Plan (GI)  Pt is currently on probiotic.  ROS:   See pertinent positives and negatives per HPI.  Patient Active Problem List   Diagnosis Date Noted  . COVID-19 05/13/2019  . Unstable angina ([place]) 05/13/2019  . Sinus bradycardia 06/15/2018  . Ischemic cardiomyopathy 06/15/2018  . NSTEMI (non-ST elevated myocardial infarction) ([place]) 06/13/2018  . GERD (gastroesophageal reflux disease) 07/10/2016  . History of colonic polyps 02/25/2016  . Antiplatelet or antithrombotic long-term use 02/25/2016  . Nodule of right lung 10/13/2012  . History of non-ST elevation myocardial infarction (NSTEMI) 08/14/2012  . Hepatic steatosis 12/03/2011  . Paroxysmal atrial fibrillation  ([place]) 12/22/2010  . CAD (coronary artery disease) 12/22/2010  . INSOMNIA 04/13/2010  . Hyperlipidemia 03/19/2010  . Essential hypertension 03/19/2010  . Obstructive sleep apnea 03/17/2010    Social History   Tobacco Use  . Smoking status: Never Smoker  . Smokeless tobacco: Never Used  Substance Use Topics  . Alcohol use: Yes    Comment: 2 drinks daily     Current Outpatient Medications:  .  acetaminophen (TYLENOL) 650 MG CR tablet, Take 650 mg by mouth every 8 (eight) hours as needed for pain., Disp: , Rfl:  .  Alirocumab (PRALUENT) 75 MG/ML SOAJ, Inject 75 mg into the skin every 14 (fourteen) days., Disp: 2 pen, Rfl: 11 .  amiodarone (PACERONE) 100 MG tablet, Take 1 tablet (100 mg total) by mouth daily., Disp: 90 tablet, Rfl: 3 .  amLODipine (NORVASC) 2.5 MG tablet, Take 1 tablet (2.5 mg total) by mouth daily., Disp: 30 tablet, Rfl: 11 .  Bempedoic Acid (NEXLETOL) 180 MG TABS, Take 1 tablet by mouth daily., Disp: 90 tablet, Rfl: 1 .  Cholecalciferol (VITAMIN D3) 25 MCG (1000 UT) CAPS, Take 1,000 Units by mouth daily., Disp: , Rfl:  .  clopidogrel (PLAVIX) 75 MG tablet, Take 1 tablet (75 mg total) by mouth daily., Disp: 90 tablet, Rfl: 3 .  Cyanocobalamin (B-12) 1000 MCG LOZG, Take 1,000 mcg by mouth daily. , Disp: , Rfl:  .  ELIQUIS 5 MG TABS tablet, TAKE 1 TABLET(5 MG) BY MOUTH TWICE DAILY, Disp: 180 tablet, Rfl: 1 .  fluticasone (FLONASE) 50 MCG/ACT nasal spray, Place 2 sprays into both  nostrils daily as needed for allergies or rhinitis., Disp: 48 g, Rfl: 1 .  losartan (COZAAR) 25 MG tablet, Take 2 tablets (50 mg total) by mouth daily., Disp: 60 tablet, Rfl: 10 .  nitroGLYCERIN (NITROSTAT) 0.4 MG SL tablet, Place 1 tablet (0.4 mg total) under the tongue every 5 (five) minutes x 3 doses as needed for chest pain., Disp: 25 tablet, Rfl: 3 .  ondansetron (ZOFRAN ODT) 4 MG disintegrating tablet, Take 1 tablet (4 mg total) by mouth every 8 (eight) hours as needed., Disp: 20 tablet, Rfl:  0 .  pantoprazole (PROTONIX) 40 MG tablet, TAKE 1 TABLET(40 MG) BY MOUTH DAILY, Disp: 90 tablet, Rfl: 1 .  potassium chloride (KLOR-CON) 10 MEQ tablet, TAKE 1 TABLET(10 MEQ) BY MOUTH DAILY, Disp: 90 tablet, Rfl: 2 .  propranolol (INDERAL) 10 MG tablet, Take 10 mg by mouth 4 (four) times daily as needed (for A Fib Attacks)., Disp: , Rfl:  .  spironolactone (ALDACTONE) 25 MG tablet, TAKE 1/2 TABLET(12.5 MG) BY MOUTH DAILY, Disp: 90 tablet, Rfl: 1  Allergies  Allergen Reactions  . Effexor [Venlafaxine] Other (See Comments)    CAUSED TOO MUCH SEDATION; CANNOT TOLERATE  . Flomax [Tamsulosin] Other (See Comments)    Caused rapid HYPOTENSION- patient came very close to fainting (also caused weakness and vertigo)  . Isosorbide     Headache  . Macrobid [Nitrofurantoin] Diarrhea, Nausea Only and Other (See Comments)    Severe abd pain   . Other Anxiety and Other (See Comments)    Intolerance to strong pain medications=  Make her feel anxious and feel like coming out of her skin  . Statins Other (See Comments)    Restless legs, bad feeling.  This has occurred with Crestor 5 mg once weekly, Lipitor 10 mg twice weekly, and Simvastatin daily  . Zetia [Ezetimibe] Other (See Comments)    Made the legs ache    Objective:   There were no vitals taken for this visit. AAOx3, NAD NCAT, EOMI No obvious CN deficits Coloring WNL Pt is able to speak clearly, coherently without shortness of breath or increased work of breathing.  Thought process is linear.  Mood is appropriate.   Assessment and Plan:   Lower abdominal pain- new.  Sxs started yesterday after lunch.  Last month pt had both C diff and Enteropathic E Coli.  She was treated w/ Vanc and Bactrim.  She states this feels similar but up to this point has not had diarrhea, 'but it feels like i'm going to'.  Thankfully she is not toxic appearing on the video and her nausea improved w/ Zofran.  Will start Bentyl for abd pain and send message to GI in  regards to next steps.  My concern is that if we treat for diverticulitis and that is not the case, we could worsen the C diff issue.  But she is not having current diarrhea to suggest recurrent C Diff.  She just had CT so if possible I would like to avoid more radiation.  She is aware of my treatment dilemma and agrees w/ me involving GI.  Will follow closely.  After consulting w/ GI we will start Dificid 200mg  BID x10 days and Florastor 1 tab BID x6 weeks.  GI will call her to schedule OV.  We will make pt aware.   Annye Asa, MD 04/08/2020

## 2020-04-09 ENCOUNTER — Ambulatory Visit (INDEPENDENT_AMBULATORY_CARE_PROVIDER_SITE_OTHER)
Admission: RE | Admit: 2020-04-09 | Discharge: 2020-04-09 | Disposition: A | Discharge status: Home / Self Care | Payer: Medicare Other | Source: Ambulatory Visit | Attending: Gastroenterology | Admitting: Gastroenterology

## 2020-04-09 ENCOUNTER — Other Ambulatory Visit: Payer: Self-pay

## 2020-04-09 ENCOUNTER — Other Ambulatory Visit (INDEPENDENT_AMBULATORY_CARE_PROVIDER_SITE_OTHER): Payer: Medicare Other

## 2020-04-09 ENCOUNTER — Ambulatory Visit (INDEPENDENT_AMBULATORY_CARE_PROVIDER_SITE_OTHER): Payer: Medicare Other | Admitting: Gastroenterology

## 2020-04-09 ENCOUNTER — Encounter: Payer: Self-pay | Admitting: Gastroenterology

## 2020-04-09 VITALS — BP 100/64 | HR 66 | Ht 60.0 in | Wt 138.0 lb

## 2020-04-09 DIAGNOSIS — R109 Unspecified abdominal pain: Secondary | ICD-10-CM

## 2020-04-09 DIAGNOSIS — A09 Infectious gastroenteritis and colitis, unspecified: Secondary | ICD-10-CM

## 2020-04-09 DIAGNOSIS — R110 Nausea: Secondary | ICD-10-CM | POA: Insufficient documentation

## 2020-04-09 DIAGNOSIS — A0472 Enterocolitis due to Clostridium difficile, not specified as recurrent: Secondary | ICD-10-CM

## 2020-04-09 DIAGNOSIS — I25119 Atherosclerotic heart disease of native coronary artery with unspecified angina pectoris: Secondary | ICD-10-CM

## 2020-04-09 DIAGNOSIS — R197 Diarrhea, unspecified: Secondary | ICD-10-CM | POA: Diagnosis not present

## 2020-04-09 LAB — COMPREHENSIVE METABOLIC PANEL
ALT: 13 U/L (ref 0–35)
AST: 21 U/L (ref 0–37)
Albumin: 4.3 g/dL (ref 3.5–5.2)
Alkaline Phosphatase: 48 U/L (ref 39–117)
BUN: 12 mg/dL (ref 6–23)
CO2: 25 mEq/L (ref 19–32)
Calcium: 8.7 mg/dL (ref 8.4–10.5)
Chloride: 94 mEq/L — ABNORMAL LOW (ref 96–112)
Creatinine, Ser: 1.05 mg/dL (ref 0.40–1.20)
GFR: 52.61 mL/min — ABNORMAL LOW (ref >60.00)
Glucose, Bld: 93 mg/dL (ref 70–99)
Potassium: 4.2 mEq/L (ref 3.5–5.1)
Sodium: 130 mEq/L — ABNORMAL LOW (ref 135–145)
Total Bilirubin: 0.5 mg/dL (ref 0.2–1.2)
Total Protein: 7.1 g/dL (ref 6.0–8.3)

## 2020-04-09 LAB — CBC WITH DIFFERENTIAL/PLATELET
Basophils Absolute: 0 10*3/uL (ref 0.0–0.1)
Basophils Relative: 0.3 % (ref 0.0–3.0)
Eosinophils Absolute: 0 10*3/uL (ref 0.0–0.7)
Eosinophils Relative: 0.2 % (ref 0.0–5.0)
HCT: 38.9 % (ref 36.0–46.0)
Hemoglobin: 13 g/dL (ref 12.0–15.0)
Lymphocytes Relative: 17.9 % (ref 12.0–46.0)
Lymphs Abs: 1.3 10*3/uL (ref 0.7–4.0)
MCHC: 33.5 g/dL (ref 30.0–36.0)
MCV: 91.3 fl (ref 78.0–100.0)
Monocytes Absolute: 0.5 10*3/uL (ref 0.1–1.0)
Monocytes Relative: 6.9 % (ref 3.0–12.0)
Neutro Abs: 5.4 10*3/uL (ref 1.4–7.7)
Neutrophils Relative %: 74.7 % (ref 43.0–77.0)
Platelets: 184 10*3/uL (ref 150.0–400.0)
RBC: 4.26 Mil/uL (ref 3.87–5.11)
RDW: 13.7 % (ref 11.5–15.5)
WBC: 7.2 10*3/uL (ref 4.0–10.5)

## 2020-04-09 MED ORDER — VANCOMYCIN HCL 125 MG PO CAPS: ORAL_CAPSULE | ORAL | 0 refills | Status: DC

## 2020-04-09 NOTE — Progress Notes (Signed)
04/09/2020 Shannon Cummings [id number] 03-15-1946   HISTORY OF PRESENT ILLNESS: This is a 74 year old female who is a patient of Dr. Lynne Leader.  She is here today with complaints of lower abdominal pain, nausea, diarrhea, and general unwell feeling.  She was diagnosed with C. difficile as well as EPEC in early October.  She was treated with course of vancomycin and Bactrim.  Symptoms improved and about resolved until just a couple days ago when she started having lower abdominal pain again and not feeling well.  She was not having diarrhea yesterday when she saw her PCP, but did start with diarrhea again this morning.  They had prescribed Dificid and recommended Florastor probiotic, but the patient has not heard from a pharmacy regarding the Dificid prescription.  She says that she feels weak and almost somewhat dizzy at times.  Her last colonoscopy was in October 2017 at which time she was found to have internal hemorrhoids, diverticulosis, an 11 mm polyp, and two other polyps removed.  These were all tubular adenomas.  Due to the size of the 11 mm polyp was reached recommended that she have a repeat at a 3-year interval.  She did not have that performed last year due to Covid and also she had some cardiac intervention with stents placed in January 2020 and then had a repeat cath in December 2020 but no new stents were placed.  Is currently on Plavix and Eliquis.   Past Medical History:  Diagnosis Date  . Arthritis   . CAD (coronary artery disease)    a. prior stenting history. b. NSTEMI s/p DES to prox LAD with EF 45% by cath 08/14/12. c. Mild trop elevation shortly after cath ?mild plaque embolization - patent stent on relook. // d. Myoview 8/18: EF 60, normal perfusion; Low Risk. e. NSTEMI 06/2018- patent prior LAD stent, 80% OM1 s/p DES, normal LVEDP, EF 45-50%, moderate residual disease treated medically.  . Fatty infiltration of liver   . GERD (gastroesophageal reflux disease)   .  Hyperlipidemia   . Hypertension   . Hypokalemia   . Hypotension   . Myocardial infarction ([place]) 2014  . OSA on CPAP   . Osteoporosis   . PAF (paroxysmal atrial fibrillation) ([place])    On rythmol previously  . Pulmonary nodule    a. 35mm subpleural nodular density CT 08/2012, instructed to f/u pulm MD.  . Sinus bradycardia   . Tubular adenoma of colon 2007   Past Surgical History:  Procedure Laterality Date  . CORONARY ANGIOPLASTY WITH STENT PLACEMENT  08/14/2012   LAD  Dr [name]  . CORONARY STENT INTERVENTION N/A 06/14/2018   Procedure: CORONARY STENT INTERVENTION;  Surgeon: Jettie Booze, MD;  Location: [place] [facility];  Service: Cardiovascular;  Laterality: N/A;  . CORONARY STENT PLACEMENT  2000  . hair restoration  02/2017  . LEFT HEART CATH AND CORONARY ANGIOGRAPHY N/A 06/14/2018   Procedure: LEFT HEART CATH AND CORONARY ANGIOGRAPHY;  Surgeon: Jettie Booze, MD;  Location: [place] [facility];  Service: Cardiovascular;  Laterality: N/A;  . LEFT HEART CATH AND CORONARY ANGIOGRAPHY N/A 05/14/2019   Procedure: LEFT HEART CATH AND CORONARY ANGIOGRAPHY;  Surgeon: [name], MD;  Location: [place] [facility];  Service: Cardiovascular;  Laterality: N/A;  . LEFT HEART CATHETERIZATION WITH CORONARY ANGIOGRAM N/A 08/14/2012   Procedure: LEFT HEART CATHETERIZATION WITH CORONARY ANGIOGRAM;  Surgeon: [name], MD;  Location: [facility] CATH LAB;  Service: Cardiovascular;  Laterality: N/A;  .  LEFT HEART CATHETERIZATION WITH CORONARY ANGIOGRAM N/A 08/19/2012   Procedure: LEFT HEART CATHETERIZATION WITH CORONARY ANGIOGRAM;  Surgeon: [name], MD;  Location: [facility] CATH LAB;  Service: Cardiovascular;  Laterality: N/A;  . ROTATOR CUFF REPAIR    . TONSILLECTOMY    . TOTAL ABDOMINAL HYSTERECTOMY      reports that she has never smoked. She has never used smokeless tobacco. She reports current alcohol use. She reports that she does not use drugs. family history includes  Alzheimer's disease in her mother; Breast cancer in her maternal aunt; Heart disease in her father. Allergies  Allergen Reactions  . Effexor [Venlafaxine] Other (See Comments)    CAUSED TOO MUCH SEDATION; CANNOT TOLERATE  . Flomax [Tamsulosin] Other (See Comments)    Caused rapid HYPOTENSION- patient came very close to fainting (also caused weakness and vertigo)  . Isosorbide     Headache  . Macrobid [Nitrofurantoin] Diarrhea, Nausea Only and Other (See Comments)    Severe abd pain   . Other Anxiety and Other (See Comments)    Intolerance to strong pain medications=  Make her feel anxious and feel like coming out of her skin  . Statins Other (See Comments)    Restless legs, bad feeling.  This has occurred with Crestor 5 mg once weekly, Lipitor 10 mg twice weekly, and Simvastatin daily  . Zetia [Ezetimibe] Other (See Comments)    Made the legs ache      Outpatient Encounter Medications as of 04/09/2020  Medication Sig  . acetaminophen (TYLENOL) 650 MG CR tablet Take 650 mg by mouth every 8 (eight) hours as needed for pain.  . Alirocumab (PRALUENT) 75 MG/ML SOAJ Inject 75 mg into the skin every 14 (fourteen) days.  Marland Kitchen amiodarone (PACERONE) 100 MG tablet Take 1 tablet (100 mg total) by mouth daily.  Marland Kitchen amLODipine (NORVASC) 2.5 MG tablet Take 1 tablet (2.5 mg total) by mouth daily.  . Bempedoic Acid (NEXLETOL) 180 MG TABS Take 1 tablet by mouth daily.  . Cholecalciferol (VITAMIN D3) 25 MCG (1000 UT) CAPS Take 1,000 Units by mouth daily.  . clopidogrel (PLAVIX) 75 MG tablet Take 1 tablet (75 mg total) by mouth daily.  . Cyanocobalamin (B-12) 1000 MCG LOZG Take 1,000 mcg by mouth daily.   Marland Kitchen dicyclomine (BENTYL) 20 MG tablet TID prn abdominal pain/spasm  . ELIQUIS 5 MG TABS tablet TAKE 1 TABLET(5 MG) BY MOUTH TWICE DAILY  . fidaxomicin (DIFICID) 200 MG TABS tablet Take 1 tablet (200 mg total) by mouth 2 (two) times daily.  . fluticasone (FLONASE) 50 MCG/ACT nasal spray Place 2 sprays into both  nostrils daily as needed for allergies or rhinitis.  Marland Kitchen losartan (COZAAR) 25 MG tablet Take 2 tablets (50 mg total) by mouth daily.  . nitroGLYCERIN (NITROSTAT) 0.4 MG SL tablet Place 1 tablet (0.4 mg total) under the tongue every 5 (five) minutes x 3 doses as needed for chest pain.  Marland Kitchen ondansetron (ZOFRAN ODT) 4 MG disintegrating tablet Take 1 tablet (4 mg total) by mouth every 8 (eight) hours as needed.  . pantoprazole (PROTONIX) 40 MG tablet TAKE 1 TABLET(40 MG) BY MOUTH DAILY  . potassium chloride (KLOR-CON) 10 MEQ tablet TAKE 1 TABLET(10 MEQ) BY MOUTH DAILY  . propranolol (INDERAL) 10 MG tablet Take 10 mg by mouth 4 (four) times daily as needed (for A Fib Attacks).  . saccharomyces boulardii (FLORASTOR) 250 MG capsule Take 1 capsule (250 mg total) by mouth 2 (two) times daily.  Marland Kitchen spironolactone (ALDACTONE) 25  MG tablet TAKE 1/2 TABLET(12.5 MG) BY MOUTH DAILY   No facility-administered encounter medications on file as of 04/09/2020.     REVIEW OF SYSTEMS  : All other systems reviewed and negative except where noted in the History of Present Illness.   PHYSICAL EXAM: BP 100/64   Pulse 66   Ht 5' (1.524 m)   Wt 138 lb (62.6 kg)   BMI 26.95 kg/m  General: Well developed white female in no acute distress Head: Normocephalic and atraumatic Eyes:  Sclerae anicteric, conjunctiva pink. Ears: Normal auditory acuity Lungs: Clear throughout to auscultation; no W/R/R. Heart: Regular rate and rhythm; no M/R/G Abdomen: Soft, non-distended.  BS present but quiet and sparse.  Diffuse lower abdominal TTP, more on the right. Musculoskeletal: Symmetrical with no gross deformities  Skin: No lesions on visible extremities Extremities: No edema  Neurological: Alert oriented x 4, grossly non-focal Psychological:  Alert and cooperative. Normal mood and affect  ASSESSMENT AND PLAN: *74 year old female with complaints of lower abdominal pain, nausea, diarrhea, and general sick feeling.  Diagnosed with  C. difficile in early October and completed course of vancomycin.  Symptoms improved significantly and about resolved until just a couple days ago.  Saw PCP yesterday and Dificid was prescribed and Florastor was recommended.  She has not yet heard anything from her pharmacy about the [place].  We called the pharmacy and is can require prior authorization.  Since then sure if this is even going to get covered if we do the prior authorization I will go ahead and prescribe vancomycin taper instead as it has about equivalent efficacy.  She already had vancomycin so there should be no issues with getting this hopefully.  Will prescribe taper as 4 times daily for 2 weeks then twice daily for a week then once daily for a week then every other day for 4 weeks.  I agree with Florastor probiotic BID.  Will check CBC and BMP today as well as an abdominal x-ray.  If she develops worsening abdominal, fever, etc then she needs to proceed to the ED. *Personal history of adenomatous polyps: Was due for colonoscopy in October 2020.  We will plan for colonoscopy in the near future once she improves from these acute issues.  I will have her return here in mid December at which time we we will readdress the issue since she is also on 2 blood thinning agents at this time.   CC:  [name], MD

## 2020-04-09 NOTE — Patient Instructions (Addendum)
Please go to the x-ray department in the basement before leaving today.  Your provider has requested that you go to the basement level for lab work before leaving today. Press "B" on the elevator. The lab is located at the first door on the left as you exit the elevator.  Due to recent changes in healthcare laws, you may see the results of your imaging and laboratory studies on MyChart before your provider has had a chance to review them.  We understand that in some cases there may be results that are confusing or concerning to you. Not all laboratory results come back in the same time frame and the provider may be waiting for multiple results in order to interpret others.  Please give [place] 48 hours in order for your provider to thoroughly review all the results before contacting the office for clarification of your results.   Normal BMI (Body Mass Index- based on height and weight) is between 23 and 30. Your BMI today is Body mass index is 26.95 kg/m. Marland Kitchen Please consider follow up  regarding your BMI with your Primary Care Provider.   We have sent the following medications to your pharmacy for you to pick up at your convenience: Vancomycin, we are going to cancel the Dificid.   Take over the counter Florastor probiotic twice a day.    Call us back next Tuesday or Wednesday with an update. Ask to speak to [place], Therapist, sports.   Please come back and see Alonza Bogus, PA-C in mid December.   I appreciate the opportunity to care for you. Alonza Bogus, PA-C

## 2020-04-12 ENCOUNTER — Telehealth: Payer: Self-pay

## 2020-04-12 NOTE — Progress Notes (Signed)
Reviewed and agree with management plan.  Meyli Boice T. Iyanni Hepp, MD FACG [facility]  

## 2020-04-12 NOTE — Telephone Encounter (Signed)
Quantity exception request faxed to [facility] for Vancomycin 125 mg capsules #91.  Patient's ID [id number] Pharmacy gave the patient a supply for a couple of days. Asked for an expedited review for this.

## 2020-04-16 ENCOUNTER — Telehealth: Payer: Self-pay

## 2020-04-16 NOTE — Telephone Encounter (Signed)
Spoke with patient regarding medication recommendation. Patient voiced understanding. No other concerns at this time.

## 2020-04-22 ENCOUNTER — Telehealth: Payer: Self-pay | Admitting: Gastroenterology

## 2020-04-22 DIAGNOSIS — R309 Painful micturition, unspecified: Secondary | ICD-10-CM | POA: Diagnosis not present

## 2020-04-22 DIAGNOSIS — N390 Urinary tract infection, site not specified: Secondary | ICD-10-CM | POA: Diagnosis not present

## 2020-04-22 NOTE — Telephone Encounter (Signed)
The pt has been advised that she needs to contact her PCP in regards to UTI.  The pt has been advised of the information and verbalized understanding.

## 2020-04-25 NOTE — Progress Notes (Signed)
Cardiology Office Note   Date:  04/26/2020   ID:  Shannon Cummings, DOB [birth year], MRN [id number]  PCP:  [name], MD  Cardiologist:   [name], MD   Chief Complaint  Patient presents with  . Coronary Artery Disease  . Hyperlipidemia   1. Coronary artery disease-status post stenting in the past. She status post stenting of her proximal LAD 08/14/2012 2. Intermittent atrial fibrillation 3.  hyperlipidemia-she has been generally intolerant to most statins   Pt is doing well. No cardiac complaints.   She complains of some tingling and numbness associated with the Crestor. She has discontinued the Crestor because of that reason. She has had reactions to most statins that we have tried.  October 08-2011  She has done well. She has not had any angina or eposides of atrial fib. She has retired and is traveling quite a bit. Her husband has also retired. She has not had any problems.  October 01, 2012:  She is having some bruising ( due to [place]). She is enjoying the cardiac rehab. She has noticed some low BP readings at the end of cardiac rehab. She does not feel bad. These low readings are typically asymptomatic.   No angina. No arrhythmias.   August 20, 2013:  Shannon Cummings is doing ok. She was admitted to the hospital with some CP recently. Rule out for MI.  She has been doing [place]. Has gained a bit of weight. She is not able to exercise because Of left ankle problems.    August 19, 2014:   Shannon Cummings is a 74 y.o. female who presents for follow up of her atrial fib. Has gained some weight.  She is in the study for lipid management Has had leg cramps since last fall. Off and on  Has had more paroxysmal atrial fib.  Lasts about a minute. Seem to be occuring more frequently.   Jan. 18, 2017:  Shannon Cummings presents for follow up of her CAD and atrial fib.  Doing well.   Has visited [place] recently .   No CP or dyspnea.   Tolerates the  Rythmol.  Talked about her daughter who lives in [place].  Is not having any significant atrial fib issues.   Has been on lipid lowering study drug at [facility]  ( injectable cholesterol medication )   Jan.   25, 2018:  Doing well.   Feeling well Has had a stomach bug last week and now has a URI  - not the flu.   Has maintained NSR .  Is having hair loss.  Her research shows that amlodipine and atenolol can cause this   Aug. 23, 2018:  Shannon Cummings is seen today for eval of some elevated BP Has been having pain in her back, tightness in her chest  Went to ER.  Has had elevated BP for a week,   Went back to ER  Doubled her metoprolol XL .  Saw Richardson Dopp on Aug. 6. 2018 Was given Imdur - developed a headache that lasted 3 days  Lexiscan myoview was low risk.   Her back pain / chest tightness has resolved.   May 08, 2017: Shannon Cummings seen today for follow-up of her hypertension, coronary artery disease, paroxysmal atrial fibrillation. Is feeling well  Is taking the Toprol XL just once a day - not BID as listed in MAR .   August 15, 2017   Shannon Cummings is doing well BP is much better.  Is exercising and watching her  diet.   Is on a study drug for cholesterol   June 18, 2018: He was admitted to the hospital recently with a non-ST segment elevation myocardial infarction. The previously placed LAD stent was widely patent.  She had a tight stenosis in her first obtuse marginal artery that was stented. Function is mildly reduced with an EF of 45 to 50%.  She returned to the ER the day after she went home with an episode of rapid AF and CP .    Troponin was  0.18  Her rhythmol has been stopped . Saw Roderic Palau and was started on Amiodarone . The plan is for short term Amio and then consider AF ablation . They also discussed Tikosyn .  She tolerates her AF poorly.  We discussed taking short acting beta blockers if she has AF with CP  Had been walking ,   Has been weak since  the MI.    Feb. 26, 2020  Shannon Cummings is seen for follow up of her CAD and PAF Has had some shortness of breath . BP is a bit elevated today  amio has been reduced to 200 mg a day  Has an appt with Dr. Rayann Heman on March 23 to discuss ablation  Is in a lipid study   Nov. 22, 2021: Shannon Cummings is seen for follow up of her CAD, PAF . She had a NSTEMI in Dec.   Propafaone was stopped after that hospitalization.  Was started on Amiodarone  Doing well from a cardiac standpoint.  Is on lots abx.  Has not been exercising .    Will check lipids, liver enz, bmp today  Is being treated for C.diff colitis     Past Medical History:  Diagnosis Date  . Arthritis   . CAD (coronary artery disease)    a. prior stenting history. b. NSTEMI s/p DES to prox LAD with EF 45% by cath 08/14/12. c. Mild trop elevation shortly after cath ?mild plaque embolization - patent stent on relook. // d. Myoview 8/18: EF 60, normal perfusion; Low Risk. e. NSTEMI 06/2018- patent prior LAD stent, 80% OM1 s/p DES, normal LVEDP, EF 45-50%, moderate residual disease treated medically.  . Fatty infiltration of liver   . GERD (gastroesophageal reflux disease)   . Hyperlipidemia   . Hypertension   . Hypokalemia   . Hypotension   . Myocardial infarction ([place]) 2014  . OSA on CPAP   . Osteoporosis   . PAF (paroxysmal atrial fibrillation) ([place])    On rythmol previously  . Pulmonary nodule    a. 72mm subpleural nodular density CT 08/2012, instructed to f/u pulm MD.  . Sinus bradycardia   . Tubular adenoma of colon 2007    Past Surgical History:  Procedure Laterality Date  . CORONARY ANGIOPLASTY WITH STENT PLACEMENT  08/14/2012   LAD  Dr [name]  . CORONARY STENT INTERVENTION N/A 06/14/2018   Procedure: CORONARY STENT INTERVENTION;  Surgeon: Jettie Booze, MD;  Location: [place] CV LAB;  Service: Cardiovascular;  Laterality: N/A;  . CORONARY STENT PLACEMENT  2000  . hair restoration  02/2017  . LEFT HEART CATH AND  CORONARY ANGIOGRAPHY N/A 06/14/2018   Procedure: LEFT HEART CATH AND CORONARY ANGIOGRAPHY;  Surgeon: Jettie Booze, MD;  Location: [place] [facility] LAB;  Service: Cardiovascular;  Laterality: N/A;  . LEFT HEART CATH AND CORONARY ANGIOGRAPHY N/A 05/14/2019   Procedure: LEFT HEART CATH AND CORONARY ANGIOGRAPHY;  Surgeon: [name], MD;  Location: [place]  CV LAB;  Service: Cardiovascular;  Laterality: N/A;  . LEFT HEART CATHETERIZATION WITH CORONARY ANGIOGRAM N/A 08/14/2012   Procedure: LEFT HEART CATHETERIZATION WITH CORONARY ANGIOGRAM;  Surgeon: [name], MD;  Location: [facility] CATH LAB;  Service: Cardiovascular;  Laterality: N/A;  . LEFT HEART CATHETERIZATION WITH CORONARY ANGIOGRAM N/A 08/19/2012   Procedure: LEFT HEART CATHETERIZATION WITH CORONARY ANGIOGRAM;  Surgeon: [name], MD;  Location: [facility] CATH LAB;  Service: Cardiovascular;  Laterality: N/A;  . ROTATOR CUFF REPAIR    . TONSILLECTOMY    . TOTAL ABDOMINAL HYSTERECTOMY       Current Outpatient Medications  Medication Sig Dispense Refill  . acetaminophen (TYLENOL) 650 MG CR tablet Take 650 mg by mouth every 8 (eight) hours as needed for pain.    . Alirocumab (PRALUENT) 75 MG/ML SOAJ Inject 75 mg into the skin every 14 (fourteen) days. 2 pen 11  . amiodarone (PACERONE) 100 MG tablet Take 1 tablet (100 mg total) by mouth daily. 90 tablet 3  . amLODipine (NORVASC) 2.5 MG tablet Take 1 tablet (2.5 mg total) by mouth daily. 30 tablet 11  . Bempedoic Acid (NEXLETOL) 180 MG TABS Take 1 tablet by mouth daily. 90 tablet 1  . Cholecalciferol (VITAMIN D3) 25 MCG (1000 UT) CAPS Take 1,000 Units by mouth daily.    . clopidogrel (PLAVIX) 75 MG tablet Take 1 tablet (75 mg total) by mouth daily. 90 tablet 3  . Cyanocobalamin (B-12) 1000 MCG LOZG Take 1,000 mcg by mouth daily.     Marland Kitchen dicyclomine (BENTYL) 20 MG tablet TID prn abdominal pain/spasm 60 tablet 0  . ELIQUIS 5 MG TABS tablet TAKE 1 TABLET(5 MG) BY MOUTH TWICE DAILY 180 tablet  1  . fluticasone (FLONASE) 50 MCG/ACT nasal spray Place 2 sprays into both nostrils daily as needed for allergies or rhinitis. 48 g 1  . losartan (COZAAR) 25 MG tablet Take 2 tablets (50 mg total) by mouth daily. 60 tablet 10  . nitroGLYCERIN (NITROSTAT) 0.4 MG SL tablet Place 1 tablet (0.4 mg total) under the tongue every 5 (five) minutes x 3 doses as needed for chest pain. 25 tablet 3  . ondansetron (ZOFRAN ODT) 4 MG disintegrating tablet Take 1 tablet (4 mg total) by mouth every 8 (eight) hours as needed. 20 tablet 0  . pantoprazole (PROTONIX) 40 MG tablet TAKE 1 TABLET(40 MG) BY MOUTH DAILY 90 tablet 1  . potassium chloride (KLOR-CON) 10 MEQ tablet TAKE 1 TABLET(10 MEQ) BY MOUTH DAILY 90 tablet 2  . propranolol (INDERAL) 10 MG tablet Take 10 mg by mouth 4 (four) times daily as needed (for A Fib Attacks).    . saccharomyces boulardii (FLORASTOR) 250 MG capsule Take 1 capsule (250 mg total) by mouth 2 (two) times daily. 90 capsule 0  . spironolactone (ALDACTONE) 25 MG tablet TAKE 1/2 TABLET(12.5 MG) BY MOUTH DAILY 90 tablet 1  . vancomycin (VANCOCIN) 125 MG capsule Take 1 capsule (125 mg total) by mouth 4 (four) times daily for 14 days, THEN 1 capsule (125 mg total) in the morning and at bedtime for 7 days, THEN 1 capsule (125 mg total) daily for 7 days, THEN 1 capsule (125 mg total) every other day for 28 days. 91 capsule 0   No current facility-administered medications for this visit.    Allergies:   Effexor [venlafaxine], Flomax [tamsulosin], Isosorbide, Macrobid [nitrofurantoin], Other, Statins, and Zetia [ezetimibe]    Social History:  The patient  reports that she has never smoked. She has never  used smokeless tobacco. She reports current alcohol use. She reports that she does not use drugs.   Family History:  The patient's family history includes Alzheimer's disease in her mother; Breast cancer in her maternal aunt; Heart disease in her father.    ROS: Noted in current history.  All  other systems are negative.  Physical Exam: Blood pressure (!) 142/80, pulse 60, height 5' (1.524 m), weight 138 lb 12.8 oz (63 kg), SpO2 99 %.  GEN:  Well nourished, well developed in no acute distress HEENT: Normal NECK: No JVD; No carotid bruits LYMPHATICS: No lymphadenopathy CARDIAC: RRR , no murmurs, rubs, gallops RESPIRATORY:  Clear to auscultation without rales, wheezing or rhonchi  ABDOMEN: Soft, non-tender, non-distended MUSCULOSKELETAL:  No edema; No deformity  SKIN: Warm and dry NEUROLOGIC:  Alert and oriented x 3    EKG:   .    Recent Labs: 04/09/2020: Hemoglobin 13.0; Platelets 184.0 04/26/2020: ALT 17; BUN 12; Creatinine, Ser 0.81; Potassium 4.0; Sodium 136; TSH 3.690    Lipid Panel    Component Value Date/Time   CHOL 151 04/26/2020 0914   TRIG 163 (H) 04/26/2020 0914   HDL 46 04/26/2020 0914   CHOLHDL 3.3 04/26/2020 0914   CHOLHDL 3.2 05/14/2019 0730   VLDL 21 05/14/2019 0730   LDLCALC 77 04/26/2020 0914   LDLDIRECT 111.0 04/28/2019 1138      Wt Readings from Last 3 Encounters:  04/26/20 138 lb 12.8 oz (63 kg)  04/09/20 138 lb (62.6 kg)  03/23/20 141 lb (64 kg)      Other studies Reviewed: Additional studies/ records that were reviewed today include: . Review of the above records demonstrates:    ASSESSMENT AND PLAN:  1. Coronary artery disease-  No angina .    2. Intermittent atrial fibrillation -    remains on amiodarone 100 mg a day.  Had a discussion of the long-term effects of amiodarone and she has now reconsidered and would like to have an A. fib ablation if possible.  We will set her up an appointment to meet with Dr. Rayann Heman.  3.  hyperlipidemia-she is on Praluent and Nexletol.  Her triglycerides continues to be mildly elevated.  We will recheck labs today.  She still eats a fair amount of rice and potatoes.  I have advised her to avoid anything that is white, wheat, sweet.  4. Essential HTN:    Blood pressures fairly well  controlled.  .    Current medicines are reviewed at length with the patient today.  The patient does not have concerns regarding medicines.  The following changes have been made:  no change  Labs/ tests ordered today include:   Orders Placed This Encounter  Procedures  . TSH  . Basic Metabolic Panel (BMET)  . Hepatic function panel  . Lipid Profile      Signed, [name], MD  04/26/2020 5:39 PM    [facility] [address], [place], [place]  [address] Phone: [phone]; Fax: [phone]

## 2020-04-26 ENCOUNTER — Ambulatory Visit (INDEPENDENT_AMBULATORY_CARE_PROVIDER_SITE_OTHER): Payer: Medicare Other | Admitting: Cardiovascular Disease

## 2020-04-26 ENCOUNTER — Encounter: Payer: Self-pay | Admitting: Cardiovascular Disease

## 2020-04-26 ENCOUNTER — Other Ambulatory Visit: Payer: Self-pay

## 2020-04-26 VITALS — BP 142/80 | HR 60 | Ht 60.0 in | Wt 138.8 lb

## 2020-04-26 DIAGNOSIS — I2510 Atherosclerotic heart disease of native coronary artery without angina pectoris: Secondary | ICD-10-CM

## 2020-04-26 DIAGNOSIS — I10 Essential (primary) hypertension: Secondary | ICD-10-CM | POA: Diagnosis not present

## 2020-04-26 DIAGNOSIS — I25119 Atherosclerotic heart disease of native coronary artery with unspecified angina pectoris: Secondary | ICD-10-CM

## 2020-04-26 DIAGNOSIS — I5042 Chronic combined systolic (congestive) and diastolic (congestive) heart failure: Secondary | ICD-10-CM

## 2020-04-26 DIAGNOSIS — E7800 Pure hypercholesterolemia, unspecified: Secondary | ICD-10-CM

## 2020-04-26 DIAGNOSIS — I4819 Other persistent atrial fibrillation: Secondary | ICD-10-CM | POA: Diagnosis not present

## 2020-04-26 LAB — BASIC METABOLIC PANEL
BUN/Creatinine Ratio: 15 (ref 12–28)
BUN: 12 mg/dL (ref 8–27)
CO2: 24 mmol/L (ref 20–29)
Calcium: 9.2 mg/dL (ref 8.7–10.3)
Chloride: 100 mmol/L (ref 96–106)
Creatinine, Ser: 0.81 mg/dL (ref 0.57–1.00)
GFR calc Af Amer: 83 mL/min/{1.73_m2} (ref >59)
GFR calc non Af Amer: 72 mL/min/{1.73_m2} (ref >59)
Glucose: 90 mg/dL (ref 65–99)
Potassium: 4 mmol/L (ref 3.5–5.2)
Sodium: 136 mmol/L (ref 134–144)

## 2020-04-26 LAB — HEPATIC FUNCTION PANEL
ALT: 17 IU/L (ref 0–32)
AST: 22 IU/L (ref 0–40)
Albumin: 4.5 g/dL (ref 3.7–4.7)
Alkaline Phosphatase: 58 IU/L (ref 44–121)
Bilirubin Total: 0.6 mg/dL (ref 0.0–1.2)
Bilirubin, Direct: 0.23 mg/dL (ref 0.00–0.40)
Total Protein: 6.8 g/dL (ref 6.0–8.5)

## 2020-04-26 LAB — LIPID PANEL
Chol/HDL Ratio: 3.3 ratio (ref 0.0–4.4)
Cholesterol, Total: 151 mg/dL (ref 100–199)
HDL: 46 mg/dL (ref >39)
LDL Chol Calc (NIH): 77 mg/dL (ref 0–99)
Triglycerides: 163 mg/dL — ABNORMAL HIGH (ref 0–149)
VLDL Cholesterol Cal: 28 mg/dL (ref 5–40)

## 2020-04-26 LAB — TSH: TSH: 3.69 u[IU]/mL (ref 0.450–4.500)

## 2020-04-26 NOTE — Patient Instructions (Signed)
Medication Instructions:  Your physician recommends that you continue on your current medications as directed. Please refer to the Current Medication list given to you today.  *If you need a refill on your cardiac medications before your next appointment, please call your pharmacy*   Lab Work: TODAY - TSH, cholesterol, liver panel, BMET If you have labs (blood work) drawn today and your tests are completely normal, you will receive your results only by: Marland Kitchen MyChart Message (if you have MyChart) OR . A paper copy in the mail If you have any lab test that is abnormal or we need to change your treatment, we will call you to review the results.   Testing/Procedures: None Ordered    Follow-Up: Your physician recommends that you schedule a follow-up appointment with Dr. Rayann Heman - his scheduler will call you    At [facility], you and your health needs are our priority.  As part of our continuing mission to provide you with exceptional heart care, we have created designated Provider Care Teams.  These Care Teams include your primary Cardiologist (physician) and Advanced Practice Providers (APPs -  Physician Assistants and Nurse Practitioners) who all work together to provide you with the care you need, when you need it.   Your next appointment:   1 year(s)  The format for your next appointment:   In Person  Provider:   You may see [name], MD or one of the following Advanced Practice Providers on your designated Care Team:    Richardson Dopp, PA-C  [place], [place]

## 2020-04-30 NOTE — Progress Notes (Signed)
Subjective:   Shannon Cummings is a 74 y.o. female who presents for an Initial Medicare Annual Wellness Visit.  Review of Systems     Cardiac Risk Factors include: advanced age (>24men, >49 women);hypertension;dyslipidemia     Objective:    Today's Vitals   05/03/20 1103  BP: 121/68  Pulse: 64  Resp: 16  Temp: 98 F (36.7 C)  TempSrc: Oral  SpO2: 97%  Weight: 139 lb 6.4 oz (63.2 kg)  Height: 5' (1.524 m)   Body mass index is 27.22 kg/m.  Advanced Directives 05/03/2020 05/13/2019 03/26/2019 03/26/2019 06/17/2018 06/14/2018 06/13/2018  Does Patient Have a Medical Advance Directive? Yes No Yes Yes Yes Yes No  Type of Paramedic of [place];Living will - [place];Living will [place];Living will Living will Living will -  Does patient want to make changes to medical advance directive? - - - - No - Patient declined No - Patient declined -  Copy of [place] in Chart? No - copy requested - No - copy requested - - - -  Would patient like information on creating a medical advance directive? - No - Patient declined - - No - Patient declined - No - Patient declined  Pre-existing out of facility DNR order (yellow form or pink MOST form) - - - - - - -    Current Medications (verified) Outpatient Encounter Medications as of 05/03/2020  Medication Sig  . acetaminophen (TYLENOL) 650 MG CR tablet Take 650 mg by mouth every 8 (eight) hours as needed for pain.  . Alirocumab (PRALUENT) 75 MG/ML SOAJ Inject 75 mg into the skin every 14 (fourteen) days.  Marland Kitchen amiodarone (PACERONE) 100 MG tablet Take 1 tablet (100 mg total) by mouth daily.  Marland Kitchen amLODipine (NORVASC) 2.5 MG tablet Take 1 tablet (2.5 mg total) by mouth daily.  . Bempedoic Acid (NEXLETOL) 180 MG TABS Take 1 tablet by mouth daily.  . Cholecalciferol (VITAMIN D3) 25 MCG (1000 UT) CAPS Take 1,000 Units by mouth daily.  . clopidogrel (PLAVIX) 75 MG tablet Take 1  tablet (75 mg total) by mouth daily.  . Cyanocobalamin (B-12) 1000 MCG LOZG Take 1,000 mcg by mouth daily.   Marland Kitchen dicyclomine (BENTYL) 20 MG tablet TID prn abdominal pain/spasm  . ELIQUIS 5 MG TABS tablet TAKE 1 TABLET(5 MG) BY MOUTH TWICE DAILY  . fluticasone (FLONASE) 50 MCG/ACT nasal spray Place 2 sprays into both nostrils daily as needed for allergies or rhinitis.  Marland Kitchen losartan (COZAAR) 25 MG tablet Take 2 tablets (50 mg total) by mouth daily.  . nitroGLYCERIN (NITROSTAT) 0.4 MG SL tablet Place 1 tablet (0.4 mg total) under the tongue every 5 (five) minutes x 3 doses as needed for chest pain.  Marland Kitchen ondansetron (ZOFRAN ODT) 4 MG disintegrating tablet Take 1 tablet (4 mg total) by mouth every 8 (eight) hours as needed.  . pantoprazole (PROTONIX) 40 MG tablet TAKE 1 TABLET(40 MG) BY MOUTH DAILY  . potassium chloride (KLOR-CON) 10 MEQ tablet TAKE 1 TABLET(10 MEQ) BY MOUTH DAILY  . propranolol (INDERAL) 10 MG tablet Take 10 mg by mouth 4 (four) times daily as needed (for A Fib Attacks).  . saccharomyces boulardii (FLORASTOR) 250 MG capsule Take 1 capsule (250 mg total) by mouth 2 (two) times daily.  Marland Kitchen spironolactone (ALDACTONE) 25 MG tablet TAKE 1/2 TABLET(12.5 MG) BY MOUTH DAILY  . vancomycin (VANCOCIN) 125 MG capsule Take 1 capsule (125 mg total) by mouth 4 (four)  times daily for 14 days, THEN 1 capsule (125 mg total) in the morning and at bedtime for 7 days, THEN 1 capsule (125 mg total) daily for 7 days, THEN 1 capsule (125 mg total) every other day for 28 days.   No facility-administered encounter medications on file as of 05/03/2020.    Allergies (verified) Effexor [venlafaxine], Flomax [tamsulosin], Isosorbide, Macrobid [nitrofurantoin], Other, Statins, and Zetia [ezetimibe]   History: Past Medical History:  Diagnosis Date  . Arthritis   . CAD (coronary artery disease)    a. prior stenting history. b. NSTEMI s/p DES to prox LAD with EF 45% by cath 08/14/12. c. Mild trop elevation shortly after  cath ?mild plaque embolization - patent stent on relook. // d. Myoview 8/18: EF 60, normal perfusion; Low Risk. e. NSTEMI 06/2018- patent prior LAD stent, 80% OM1 s/p DES, normal LVEDP, EF 45-50%, moderate residual disease treated medically.  . Fatty infiltration of liver   . GERD (gastroesophageal reflux disease)   . Hyperlipidemia   . Hypertension   . Hypokalemia   . Hypotension   . Myocardial infarction ([place]) 2014  . OSA on CPAP   . Osteoporosis   . PAF (paroxysmal atrial fibrillation) ([place])    On rythmol previously  . Pulmonary nodule    a. 63mm subpleural nodular density CT 08/2012, instructed to f/u pulm MD.  . Sinus bradycardia   . Tubular adenoma of colon 2007   Past Surgical History:  Procedure Laterality Date  . CORONARY ANGIOPLASTY WITH STENT PLACEMENT  08/14/2012   LAD  Dr [name]  . CORONARY STENT INTERVENTION N/A 06/14/2018   Procedure: CORONARY STENT INTERVENTION;  Surgeon: Jettie Booze, MD;  Location: [place] [facility];  Service: Cardiovascular;  Laterality: N/A;  . CORONARY STENT PLACEMENT  2000  . hair restoration  02/2017  . LEFT HEART CATH AND CORONARY ANGIOGRAPHY N/A 06/14/2018   Procedure: LEFT HEART CATH AND CORONARY ANGIOGRAPHY;  Surgeon: Jettie Booze, MD;  Location: [place] [facility];  Service: Cardiovascular;  Laterality: N/A;  . LEFT HEART CATH AND CORONARY ANGIOGRAPHY N/A 05/14/2019   Procedure: LEFT HEART CATH AND CORONARY ANGIOGRAPHY;  Surgeon: [name], MD;  Location: [place] [facility];  Service: Cardiovascular;  Laterality: N/A;  . LEFT HEART CATHETERIZATION WITH CORONARY ANGIOGRAM N/A 08/14/2012   Procedure: LEFT HEART CATHETERIZATION WITH CORONARY ANGIOGRAM;  Surgeon: [name], MD;  Location: [facility] CATH LAB;  Service: Cardiovascular;  Laterality: N/A;  . LEFT HEART CATHETERIZATION WITH CORONARY ANGIOGRAM N/A 08/19/2012   Procedure: LEFT HEART CATHETERIZATION WITH CORONARY ANGIOGRAM;  Surgeon: [name], MD;  Location:  [facility] C[facility] LAB;  Service: Cardiovascular;  Laterality: N/A;  . ROTATOR CUFF REPAIR    . TONSILLECTOMY    . TOTAL ABDOMINAL HYSTERECTOMY     Family History  Problem Relation Age of Onset  . Heart disease Father   . Alzheimer's disease Mother   . Breast cancer Maternal Aunt   . Colon cancer Neg Hx    Social History   Socioeconomic History  . Marital status: Married    Spouse name: Not on file  . Number of children: 2  . Years of education: Not on file  . Highest education level: Not on file  Occupational History  . 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 Smoking status: Never Smoker  . Smokeless tobacco: Never Used  Vaping Use  .  Vaping Use: Never used  Substance and Sexual Activity  . Alcohol use: Yes    Comment: 2 drinks daily   . Drug use: No  . Sexual activity: Not Currently  Other Topics Concern  . Not on file  Social History Narrative   Daily caffeine    [place] in [place]   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[employer] Strain: [place]   . Difficulty of Paying Living Expenses: Not hard at all  Food Insecurity: No Food Insecurity  . Worried About Charity fundraiser in the Last Year: Never true  . Ran Out of Food in the Last Year: Never true  Transportation Needs: No Transportation Needs  . Lack of Transportation (Medical): No  . Lack of Transportation (Non-Medical): No  Physical Activity: Sufficiently Active  . Days of Exercise per Week: 4 days  . Minutes of Exercise per Session: 60 min  Stress: No Stress Concern Present  . Feeling of Stress : Not at all  Social Connections: Moderately Isolated  . Frequency of Communication with Friends and Family: More than three times a week  . Frequency of Social Gatherings with Friends and Family: More than three times a week  . Attends Religious Services: Never  . Active Member of Clubs or  Organizations: No  . Attends Archivist Meetings: Never  . Marital Status: Married    Tobacco Counseling Counseling given: Not Answered   Clinical Intake:  Pre-visit preparation completed: Yes  Pain : No/denies pain     Nutritional Status: BMI 25 -29 Overweight Nutritional Risks: None Diabetes: No  How often do you need to have someone help you when you read instructions, pamphlets, or other written materials from your doctor or pharmacy?: 1 - Never What is the last grade level you completed in school?: some college  Diabetic?No  Interpreter Needed?: No  Information entered by :: Caroleen Hamman LPN   Activities of Daily Living In your present state of health, do you have any difficulty performing the following activities: 05/03/2020 04/08/2020  Hearing? N N  Vision? N N  Difficulty concentrating or making decisions? Y N  Comment ocaasionally forgets names or words -  Walking or climbing stairs? N N  Dressing or bathing? N N  Doing errands, shopping? N N  Preparing Food and eating ? N -  Using the Toilet? N -  In the past six months, have you accidently leaked urine? N -  Do you have problems with loss of bowel control? N -  Managing your Medications? N -  Managing your Finances? N -  Housekeeping or managing your Housekeeping? N -  Some recent data might be hidden    Patient Care Team: [name], MD as PCP - General (Family Medicine) Nahser, Wonda Cheng, MD as PCP - Cardiology (Cardiology) Thompson Grayer, MD as PCP - Electrophysiology (Cardiology) Key, Nelia Shi, NP as Nurse Practitioner (Gynecology) Oscar La, grant (Dermatology) Renda Rolls, Jennefer Bravo, MD as Referring Physician (Dermatology) Nahser, Wonda Cheng, MD as Consulting Physician (Cardiology) Bjorn Loser, MD as Consulting Physician (Urology) Ladene Artist, MD as Consulting Physician (Gastroenterology)  Indicate any recent Medical Services you may have received from other than  [facility] providers in the past year (date may be approximate).     Assessment:   This is a routine wellness examination for Shannon Cummings.  Hearing/Vision screen  Hearing Screening   125Hz  250Hz  500Hz  1000Hz  2000Hz  3000Hz  4000Hz  6000Hz  8000Hz   Right ear:           Left ear:           Comments: No issues  Vision Screening Comments: Wears glasses Last eye exam-06/2019-Dr. Oswaldo Conroy  Dietary issues and exercise activities discussed: Current Exercise Habits: Home exercise routine, Type of exercise: walking;treadmill;yoga;strength training/weights, Time (Minutes): 60, Frequency (Times/Week): 4, Weekly Exercise (Minutes/Week): 240, Intensity: Mild, Exercise limited by: None identified  Goals    . LDL CALC < 70    . Patient Stated     Eat healthier & increase activity      Depression Screen PHQ 2/9 Scores 05/03/2020 04/08/2020 08/11/2019 04/28/2019 03/04/2019 08/05/2018 04/30/2018  PHQ - 2 Score 1 0 0 0 0 0 0  PHQ- 9 Score - 0 0 0 - - 0    Fall Risk Fall Risk  05/03/2020 04/08/2020 08/11/2019 04/28/2019 03/04/2019  Falls in the past year? 1 0 0 0 0  Comment - - - - -  Number falls in past yr: 1 0 0 0 0  Injury with Fall? 0 0 0 0 0  Risk for fall due to : - No Fall Risks - - -  Follow up Falls prevention discussed - Falls evaluation completed Falls evaluation completed -    Any stairs in or around the home? Yes  If so, are there any without handrails? No  Home free of loose throw rugs in walkways, pet beds, electrical cords, etc? Yes  Adequate lighting in your home to reduce risk of falls? Yes   ASSISTIVE DEVICES UTILIZED TO PREVENT FALLS:  Life alert? No  Use of a cane, walker or w/c? No  Grab bars in the bathroom? No  Shower chair or bench in shower? No  Elevated toilet seat or a handicapped toilet? No   TIMED UP AND GO:  Was the test performed? Yes .  Length of time to ambulate 10 feet: 9 sec.   Gait steady and fast without use of assistive device  Cognitive Function:No cognitive  impairment noted. MMSE - Mini Mental State Exam 05/02/2017  Orientation to time 5  Orientation to Place 5  Registration 2  Attention/ Calculation 5  Recall 3  Language- name 2 objects 2  Language- repeat 1  Language- follow 3 step command 3  Language- read & follow direction 1  Write a sentence 1  Copy design 1  Total score 29     6CIT Screen 05/03/2020  What Year? 0 points  What month? 0 points  What time? 0 points  Count back from 20 0 points  Months in reverse 0 points  Repeat phrase 0 points  Total Score 0    Immunizations Immunization History  Administered Date(s) Administered  . Fluad Quad(high Dose 65+) 03/04/2019, 05/03/2020  . Influenza Split 03/06/2011, 03/05/2012  . Influenza Whole 04/06/2010  . Influenza, High Dose Seasonal PF 01/04/2016, 04/10/2018  . Influenza,inj,Quad PF,6+ Mos 03/26/2017  . PFIZER SARS-COV-2 Vaccination 07/15/2019, 08/12/2019  . Pneumococcal Conjugate-13 07/10/2016  . Pneumococcal Polysaccharide-23 08/15/2012  . Zoster 07/10/2012    TDAP status: Due, Education has been provided regarding the importance of this vaccine. Advised may receive this vaccine at local pharmacy or Health Dept. Aware to provide a copy of the vaccination record if obtained from local pharmacy or Health Dept. Verbalized acceptance and understanding.   Flu Vaccine status: Completed at today's visit   Pneumococcal vaccine status: Up to date   Covid-19 vaccine status: Completed vaccines  Qualifies for Shingles Vaccine? Yes  Zostavax completed Yes   Shingrix Completed?: No.    Education has been provided regarding the importance of this vaccine. Patient has been advised to call insurance company to determine out of pocket expense if they have not yet received this vaccine. Advised may also receive vaccine at local pharmacy or Health Dept. Verbalized acceptance and understanding.  Screening Tests Health Maintenance  Topic Date Due  . Hepatitis C Screening  Never  done  . TETANUS/TDAP  Never done  . MAMMOGRAM  04/08/2021 (Originally 03/13/2020)  . COLONOSCOPY  04/08/2021 (Originally 03/08/2019)  . DEXA SCAN  05/03/2021 (Originally 06/29/2011)  . INFLUENZA VACCINE  Completed  . COVID-19 Vaccine  Completed  . PNA vac Low Risk Adult  Completed    Health Maintenance  Health Maintenance Due  Topic Date Due  . Hepatitis C Screening  Never done  . TETANUS/TDAP  Never done    Colorectal cancer screening: Completed Colonoscopy 2017. Repeat every 3 years Patient currently recovering from [place]. She has an upcoming appt with GI & plans to discuss colonoscopy at that time.  Mammogram status: Due- Patient wishes to call & schedule herself.  Bone density status: Due- Patient declined.  Lung Cancer Screening: (Low Dose CT Chest recommended if Age 78-80 years, 30 pack-year currently smoking OR have quit w/in 15years.) does not qualify.     Additional Screening:  Hepatitis C Screening: does qualify; Discuss with PCP  Vision Screening: Recommended annual ophthalmology exams for early detection of glaucoma and other disorders of the eye. Is the patient up to date with their annual eye exam?  Yes  Who is the provider or what is the name of the office in which the patient attends annual eye exams? Dr.Barts  Dental Screening: Recommended annual dental exams for proper oral hygiene  Community Resource Referral / Chronic Care Management: CRR required this visit?  No   CCM required this visit?  No      Plan:     I have personally reviewed and noted the following in the patient's chart:   . Medical and social history . Use of alcohol, tobacco or illicit drugs  . Current medications and supplements . Functional ability and status . Nutritional status . Physical activity . Advanced directives . List of other physicians . Hospitalizations, surgeries, and ER visits in previous 12 months . Vitals . Screenings to include cognitive, depression, and  falls . Referrals and appointments  In addition, I have reviewed and discussed with patient certain preventive protocols, quality metrics, and best practice recommendations. A written personalized care plan for preventive services as well as general preventive health recommendations were provided to patient.   Patient to access avs via mychart.  Marta Antu, LPN   [id number]  Nurse health Advisor  Nurse Notes: None

## 2020-05-03 ENCOUNTER — Ambulatory Visit (INDEPENDENT_AMBULATORY_CARE_PROVIDER_SITE_OTHER): Payer: Medicare Other

## 2020-05-03 ENCOUNTER — Other Ambulatory Visit: Payer: Self-pay

## 2020-05-03 VITALS — BP 121/68 | HR 64 | Temp 98.0°F | Resp 16 | Ht 60.0 in | Wt 139.4 lb

## 2020-05-03 DIAGNOSIS — Z0000 Encounter for general adult medical examination without abnormal findings: Secondary | ICD-10-CM | POA: Diagnosis not present

## 2020-05-03 DIAGNOSIS — Z23 Encounter for immunization: Secondary | ICD-10-CM

## 2020-05-03 NOTE — Patient Instructions (Signed)
Shannon Cummings , Thank you for taking time to come for your Medicare Wellness Visit. I appreciate your ongoing commitment to your health goals. Please review the following plan we discussed and let me know if I can assist you in the future.   Screening recommendations/referrals: Colonoscopy: Completed 03/07/2016-Due 03/07/09/08/2018- Per our conversation, you will discuss with GI. Mammogram: Due- Per our conversation, you will call to schedule. Please have copy of results sent to Dr. Birdie Riddle. Bone Density: Declined Recommended yearly ophthalmology/optometry visit for glaucoma screening and checkup Recommended yearly dental visit for hygiene and checkup  Vaccinations: Influenza vaccine: Up to date- Given at today's visit. Pneumococcal vaccine: Completed vaccines Tdap vaccine: Discuss with pharmacy Shingles vaccine: Discuss with pharmacy Covid-19:Completed vaccines  Advanced directives: Please bring a copy for your chart  Conditions/risks identified: See problem list  Next appointment: Follow up in one year for your annual wellness visit    Preventive Care 65 Years and Older, Female Preventive care refers to lifestyle choices and visits with your health care provider that can promote health and wellness. What does preventive care include?  A yearly physical exam. This is also called an annual well check.  Dental exams once or twice a year.  Routine eye exams. Ask your health care provider how often you should have your eyes checked.  Personal lifestyle choices, including:  Daily care of your teeth and gums.  Regular physical activity.  Eating a healthy diet.  Avoiding tobacco and drug use.  Limiting alcohol use.  Practicing safe sex.  Taking low-dose aspirin every day.  Taking vitamin and mineral supplements as recommended by your health care provider. What happens during an annual well check? The services and screenings done by your health care provider during your annual  well check will depend on your age, overall health, lifestyle risk factors, and family history of disease. Counseling  Your health care provider may ask you questions about your:  Alcohol use.  Tobacco use.  Drug use.  Emotional well-being.  Home and relationship well-being.  Sexual activity.  Eating habits.  History of falls.  Memory and ability to understand (cognition).  Work and work Statistician.  Reproductive health. Screening  You may have the following tests or measurements:  Height, weight, and BMI.  Blood pressure.  Lipid and cholesterol levels. These may be checked every 5 years, or more frequently if you are over 30 years old.  Skin check.  Lung cancer screening. You may have this screening every year starting at age 27 if you have a 30-pack-year history of smoking and currently smoke or have quit within the past 15 years.  Fecal occult blood test (FOBT) of the stool. You may have this test every year starting at age 66.  Flexible sigmoidoscopy or colonoscopy. You may have a sigmoidoscopy every 5 years or a colonoscopy every 10 years starting at age 61.  Hepatitis C blood test.  Hepatitis B blood test.  Sexually transmitted disease (STD) testing.  Diabetes screening. This is done by checking your blood sugar (glucose) after you have not eaten for a while (fasting). You may have this done every 1-3 years.  Bone density scan. This is done to screen for osteoporosis. You may have this done starting at age 19.  Mammogram. This may be done every 1-2 years. Talk to your health care provider about how often you should have regular mammograms. Talk with your health care provider about your test results, treatment options, and if necessary, the need for more  tests. Vaccines  Your health care provider may recommend certain vaccines, such as:  Influenza vaccine. This is recommended every year.  Tetanus, diphtheria, and acellular pertussis (Tdap, Td) vaccine.  You may need a Td booster every 10 years.  Zoster vaccine. You may need this after age 4.  Pneumococcal 13-valent conjugate (PCV13) vaccine. One dose is recommended after age 64.  Pneumococcal polysaccharide (PPSV23) vaccine. One dose is recommended after age 31. Talk to your health care provider about which screenings and vaccines you need and how often you need them. This information is not intended to replace advice given to you by your health care provider. Make sure you discuss any questions you have with your health care provider. Document Released: 06/18/2015 Document Revised: 02/09/2016 Document Reviewed: 03/23/2015 Elsevier Interactive Patient Education  2017 [employer] Prevention in the Home Falls can cause injuries. They can happen to people of all ages. There are many things you can do to make your home safe and to help prevent falls. What can I do on the outside of my home?  Regularly fix the edges of walkways and driveways and fix any cracks.  Remove anything that might make you trip as you walk through a door, such as a raised step or threshold.  Trim any bushes or trees on the path to your home.  Use bright outdoor lighting.  Clear any walking paths of anything that might make someone trip, such as rocks or tools.  Regularly check to see if handrails are loose or broken. Make sure that both sides of any steps have handrails.  Any raised decks and porches should have guardrails on the edges.  Have any leaves, snow, or ice cleared regularly.  Use sand or salt on walking paths during winter.  Clean up any spills in your garage right away. This includes oil or grease spills. What can I do in the bathroom?  Use night lights.  Install grab bars by the toilet and in the tub and shower. Do not use towel bars as grab bars.  Use non-skid mats or decals in the tub or shower.  If you need to sit down in the shower, use a plastic, non-slip stool.  Keep the  floor dry. Clean up any water that spills on the floor as soon as it happens.  Remove soap buildup in the tub or shower regularly.  Attach bath mats securely with double-sided non-slip rug tape.  Do not have throw rugs and other things on the floor that can make you trip. What can I do in the bedroom?  Use night lights.  Make sure that you have a light by your bed that is easy to reach.  Do not use any sheets or blankets that are too big for your bed. They should not hang down onto the floor.  Have a firm chair that has side arms. You can use this for support while you get dressed.  Do not have throw rugs and other things on the floor that can make you trip. What can I do in the kitchen?  Clean up any spills right away.  Avoid walking on wet floors.  Keep items that you use a lot in easy-to-reach places.  If you need to reach something above you, use a strong step stool that has a grab bar.  Keep electrical cords out of the way.  Do not use floor polish or wax that makes floors slippery. If you must use wax, use non-skid floor  wax.  Do not have throw rugs and other things on the floor that can make you trip. What can I do with my stairs?  Do not leave any items on the stairs.  Make sure that there are handrails on both sides of the stairs and use them. Fix handrails that are broken or loose. Make sure that handrails are as long as the stairways.  Check any carpeting to make sure that it is firmly attached to the stairs. Fix any carpet that is loose or worn.  Avoid having throw rugs at the top or bottom of the stairs. If you do have throw rugs, attach them to the floor with carpet tape.  Make sure that you have a light switch at the top of the stairs and the bottom of the stairs. If you do not have them, ask someone to add them for you. What else can I do to help prevent falls?  Wear shoes that:  Do not have high heels.  Have rubber bottoms.  Are comfortable and fit  you well.  Are closed at the toe. Do not wear sandals.  If you use a stepladder:  Make sure that it is fully opened. Do not climb a closed stepladder.  Make sure that both sides of the stepladder are locked into place.  Ask someone to hold it for you, if possible.  Clearly mark and make sure that you can see:  Any grab bars or handrails.  First and last steps.  Where the edge of each step is.  Use tools that help you move around (mobility aids) if they are needed. These include:  Canes.  Walkers.  Scooters.  Crutches.  Turn on the lights when you go into a dark area. Replace any light bulbs as soon as they burn out.  Set up your furniture so you have a clear path. Avoid moving your furniture around.  If any of your floors are uneven, fix them.  If there are any pets around you, be aware of where they are.  Review your medicines with your doctor. Some medicines can make you feel dizzy. This can increase your chance of falling. Ask your doctor what other things that you can do to help prevent falls. This information is not intended to replace advice given to you by your health care provider. Make sure you discuss any questions you have with your health care provider. Document Released: 03/18/2009 Document Revised: 10/28/2015 Document Reviewed: 06/26/2014 Elsevier Interactive Patient Education  2017 [employer].

## 2020-05-10 ENCOUNTER — Telehealth: Payer: Self-pay

## 2020-05-10 DIAGNOSIS — Z006 Encounter for examination for normal comparison and control in clinical research program: Secondary | ICD-10-CM

## 2020-05-10 NOTE — Telephone Encounter (Signed)
Called subject for CLEAR T19 M51. Left voicemail asking for return call. Medical chart review completed.

## 2020-05-20 ENCOUNTER — Encounter: Payer: Self-pay | Admitting: Gastroenterology

## 2020-05-20 ENCOUNTER — Ambulatory Visit (INDEPENDENT_AMBULATORY_CARE_PROVIDER_SITE_OTHER): Payer: Medicare Other | Admitting: Gastroenterology

## 2020-05-20 VITALS — BP 124/84 | HR 63 | Ht 60.0 in | Wt 141.5 lb

## 2020-05-20 DIAGNOSIS — I25119 Atherosclerotic heart disease of native coronary artery with unspecified angina pectoris: Secondary | ICD-10-CM | POA: Diagnosis not present

## 2020-05-20 DIAGNOSIS — Z86010 Personal history of colonic polyps: Secondary | ICD-10-CM

## 2020-05-20 DIAGNOSIS — Z8619 Personal history of other infectious and parasitic diseases: Secondary | ICD-10-CM | POA: Insufficient documentation

## 2020-05-20 NOTE — Progress Notes (Signed)
Reviewed and agree with management plan.  Jaleel Allen T. Cutberto Winfree, MD FACG [facility]  

## 2020-05-20 NOTE — Patient Instructions (Signed)
If you are age 73 or older, your body mass index should be between 23-30. Your Body mass index is 27.63 kg/m. If this is out of the aforementioned range listed, please consider follow up with your Primary Care Provider.  If you are age 79 or younger, your body mass index should be between 19-25. Your Body mass index is 27.63 kg/m. If this is out of the aformentioned range listed, please consider follow up with your Primary Care Provider.   Call back and ask for [facility] after the new year to schedule colonoscopy.   Continue Probiotic as discussed.

## 2020-05-20 NOTE — Progress Notes (Signed)
05/20/2020 Shannon Cummings [id number] 1945/11/17   HISTORY OF PRESENT ILLNESS:  This is a 74 year old female who is a patient of Dr. Lynne Leader.  She has recently been treated for C. difficile and EPEC.  She has she just completed her last dose of vancomycin from her vancomycin taper today.  She says that she has been having solid stools now for the most part over the past approximately 2 weeks.  She still gets generalized abdominal discomfort intermittently and a few weeks ago she had some right-sided abdominal pain, but that resolved after taking some dicyclomine.  She also still gets some intermittent nausea without vomiting.  Has Zofran that she uses as needed for that.   Her last colonoscopy was in October 2017 at which time she was found to have internal hemorrhoids, diverticulosis, an 11 mm polyp, and two other polyps removed.  These were all tubular adenomas.  Due to the size of the 11 mm polyp was reached recommended that she have a repeat at a 3-year interval.  She did not have that performed last year due to Covid and also she had some cardiac intervention with stents placed in January 2020 and then had a repeat cath in December 2020 but no new stents were placed.  Is currently on Plavix and Eliquis.  She tells me that she is meeting with Dr. Rayann Heman at the very beginning of January to discuss possible cardioversion for her atrial fibrillation.  She would like to hold off on getting her colonoscopy until she knows more information about that, if it is going to be performed, etc.    Past Medical History:  Diagnosis Date  . Arthritis   . CAD (coronary artery disease)    a. prior stenting history. b. NSTEMI s/p DES to prox LAD with EF 45% by cath 08/14/12. c. Mild trop elevation shortly after cath ?mild plaque embolization - patent stent on relook. // d. Myoview 8/18: EF 60, normal perfusion; Low Risk. e. NSTEMI 06/2018- patent prior LAD stent, 80% OM1 s/p DES, normal LVEDP, EF 45-50%,  moderate residual disease treated medically.  . Fatty infiltration of liver   . GERD (gastroesophageal reflux disease)   . Hyperlipidemia   . Hypertension   . Hypokalemia   . Hypotension   . Myocardial infarction ([place]) 2014  . OSA on CPAP   . Osteoporosis   . PAF (paroxysmal atrial fibrillation) ([place])    On rythmol previously  . Pulmonary nodule    a. 45mm subpleural nodular density CT 08/2012, instructed to f/u pulm MD.  . Sinus bradycardia   . Tubular adenoma of colon 2007   Past Surgical History:  Procedure Laterality Date  . CORONARY ANGIOPLASTY WITH STENT PLACEMENT  08/14/2012   LAD  Dr [name]  . CORONARY STENT INTERVENTION N/A 06/14/2018   Procedure: CORONARY STENT INTERVENTION;  Surgeon: Jettie Booze, MD;  Location: [facility];  Service: Cardiovascular;  Laterality: N/A;  . CORONARY STENT PLACEMENT  2000  . hair restoration  02/2017  . LEFT HEART CATH AND CORONARY ANGIOGRAPHY N/A 06/14/2018   Procedure: LEFT HEART CATH AND CORONARY ANGIOGRAPHY;  Surgeon: Jettie Booze, MD;  Location: [place] [facility];  Service: Cardiovascular;  Laterality: N/A;  . LEFT HEART CATH AND CORONARY ANGIOGRAPHY N/A 05/14/2019   Procedure: LEFT HEART CATH AND CORONARY ANGIOGRAPHY;  Surgeon: [name], MD;  Location: [place] [facility];  Service: Cardiovascular;  Laterality: N/A;  . LEFT HEART CATHETERIZATION WITH CORONARY  ANGIOGRAM N/A 08/14/2012   Procedure: LEFT HEART CATHETERIZATION WITH CORONARY ANGIOGRAM;  Surgeon: [name], MD;  Location: [facility]ATH LAB;  Service: Cardiovascular;  Laterality: N/A;  . LEFT HEART CATHETERIZATION WITH CORONARY ANGIOGRAM N/A 08/19/2012   Procedure: LEFT HEART CATHETERIZATION WITH CORONARY ANGIOGRAM;  Surgeon: [name], MD;  Location: [facility] CATH LAB;  Service: Cardiovascular;  Laterality: N/A;  . ROTATOR CUFF REPAIR    . TONSILLECTOMY    . TOTAL ABDOMINAL HYSTERECTOMY      reports that she has never smoked. She has never used  smokeless tobacco. She reports current alcohol use. She reports that she does not use drugs. family history includes Alzheimer's disease in her mother; Breast cancer in her maternal aunt; Heart disease in her father. Allergies  Allergen Reactions  . Effexor [Venlafaxine] Other (See Comments)    CAUSED TOO MUCH SEDATION; CANNOT TOLERATE  . Flomax [Tamsulosin] Other (See Comments)    Caused rapid HYPOTENSION- patient came very close to fainting (also caused weakness and vertigo)  . Isosorbide     Headache  . Macrobid [Nitrofurantoin] Diarrhea, Nausea Only and Other (See Comments)    Severe abd pain   . Other Anxiety and Other (See Comments)    Intolerance to strong pain medications=  Make her feel anxious and feel like coming out of her skin  . Statins Other (See Comments)    Restless legs, bad feeling.  This has occurred with Crestor 5 mg once weekly, Lipitor 10 mg twice weekly, and Simvastatin daily  . Zetia [Ezetimibe] Other (See Comments)    Made the legs ache      Outpatient Encounter Medications as of 05/20/2020  Medication Sig  . acetaminophen (TYLENOL) 650 MG CR tablet Take 650 mg by mouth every 8 (eight) hours as needed for pain.  . Alirocumab (PRALUENT) 75 MG/ML SOAJ Inject 75 mg into the skin every 14 (fourteen) days.  Marland Kitchen amiodarone (PACERONE) 100 MG tablet Take 1 tablet (100 mg total) by mouth daily.  Marland Kitchen amLODipine (NORVASC) 2.5 MG tablet Take 1 tablet (2.5 mg total) by mouth daily.  . Bempedoic Acid (NEXLETOL) 180 MG TABS Take 1 tablet by mouth daily.  . Cholecalciferol (VITAMIN D3) 25 MCG (1000 UT) CAPS Take 1,000 Units by mouth daily.  . clopidogrel (PLAVIX) 75 MG tablet Take 1 tablet (75 mg total) by mouth daily.  . Cyanocobalamin (B-12) 1000 MCG LOZG Take 1,000 mcg by mouth daily.   Marland Kitchen dicyclomine (BENTYL) 20 MG tablet TID prn abdominal pain/spasm  . ELIQUIS 5 MG TABS tablet TAKE 1 TABLET(5 MG) BY MOUTH TWICE DAILY  . fluticasone (FLONASE) 50 MCG/ACT nasal spray Place 2  sprays into both nostrils daily as needed for allergies or rhinitis.  Marland Kitchen losartan (COZAAR) 25 MG tablet Take 2 tablets (50 mg total) by mouth daily.  . nitroGLYCERIN (NITROSTAT) 0.4 MG SL tablet Place 1 tablet (0.4 mg total) under the tongue every 5 (five) minutes x 3 doses as needed for chest pain.  Marland Kitchen ondansetron (ZOFRAN ODT) 4 MG disintegrating tablet Take 1 tablet (4 mg total) by mouth every 8 (eight) hours as needed.  . pantoprazole (PROTONIX) 40 MG tablet TAKE 1 TABLET(40 MG) BY MOUTH DAILY  . potassium chloride (KLOR-CON) 10 MEQ tablet TAKE 1 TABLET(10 MEQ) BY MOUTH DAILY  . propranolol (INDERAL) 10 MG tablet Take 10 mg by mouth 4 (four) times daily as needed (for A Fib Attacks).  . saccharomyces boulardii (FLORASTOR) 250 MG capsule Take 1 capsule (250 mg total) by  mouth 2 (two) times daily.  Marland Kitchen spironolactone (ALDACTONE) 25 MG tablet TAKE 1/2 TABLET(12.5 MG) BY MOUTH DAILY  . [DISCONTINUED] vancomycin (VANCOCIN) 125 MG capsule Take 1 capsule (125 mg total) by mouth 4 (four) times daily for 14 days, THEN 1 capsule (125 mg total) in the morning and at bedtime for 7 days, THEN 1 capsule (125 mg total) daily for 7 days, THEN 1 capsule (125 mg total) every other day for 28 days. (Patient not taking: Reported on 05/20/2020)   No facility-administered encounter medications on file as of 05/20/2020.     REVIEW OF SYSTEMS  : All other systems reviewed and negative except where noted in the History of Present Illness.   PHYSICAL EXAM: BP 124/84 (BP Location: Right Arm, Patient Position: Sitting, Cuff Size: Normal)   Pulse 63   Ht 5' (1.524 m)   Wt 141 lb 8 oz (64.2 kg)   SpO2 99%   BMI 27.63 kg/m  General: Well developed white female in no acute distress Head: Normocephalic and atraumatic Eyes:  Sclerae anicteric, conjunctiva pink. Ears: Normal auditory acuity Lungs: Clear throughout to auscultation; no W/R/R. Heart: Regular rate and rhythm; no M/R/G. Abdomen: Soft, non-distended.  BS  present.  Non-tender. Rectal:  Will be done at the time of colonoscopy. Musculoskeletal: Symmetrical with no gross deformities  Skin: No lesions on visible extremities Extremities: No edema  Neurological: Alert oriented x 4, grossly non-focal Psychological:  Alert and cooperative. Normal mood and affect  ASSESSMENT AND PLAN: *74 year old female with recent Cdiff.  Just completed vancomycin taper today.  Having solid stools for the past couple of weeks now.  Still has some intermittent nausea and some right-sided abdominal pain at times.  Would continue probiotic in the form of Florastor twice daily for now.  If still doing well after a month could decrease to once daily.  Continue dicyclomine as needed for abdominal pain and Zofran as needed for nausea.  Advised that it make take several weeks for things to return completely to normal.  Should call us back if significant diarrhea returns. *Personal history of adenomatous polyps: Was due for colonoscopy in October 2020.  She would like to hold off on scheduling her colonoscopy until after the first of the year.  She has an appointment with cardiology to discuss possible cardioversion for her A. fib and wants to see what happens with that first.  She will call back here in January when she has more information about that and is ready to schedule.  She is on Plavix and Eliquis so those will both need to be held for her procedures.   CC:  [name], MD

## 2020-05-31 ENCOUNTER — Other Ambulatory Visit: Payer: Self-pay | Admitting: Cardiovascular Disease

## 2020-06-01 ENCOUNTER — Other Ambulatory Visit: Payer: Self-pay | Admitting: Physician Assistant

## 2020-06-03 ENCOUNTER — Other Ambulatory Visit: Payer: Self-pay | Admitting: Cardiovascular Disease

## 2020-06-07 ENCOUNTER — Ambulatory Visit: Payer: Medicare Other | Admitting: Internal Medicine

## 2020-06-11 ENCOUNTER — Other Ambulatory Visit: Payer: Self-pay | Admitting: Cardiovascular Disease

## 2020-06-16 DIAGNOSIS — Z1231 Encounter for screening mammogram for malignant neoplasm of breast: Secondary | ICD-10-CM | POA: Diagnosis not present

## 2020-06-18 ENCOUNTER — Other Ambulatory Visit: Payer: Self-pay | Admitting: Family Medicine

## 2020-06-30 ENCOUNTER — Telehealth: Payer: Self-pay | Admitting: Pharmacist

## 2020-06-30 ENCOUNTER — Ambulatory Visit: Payer: Medicare Other | Admitting: Orthopedic Surgery

## 2020-06-30 ENCOUNTER — Ambulatory Visit (INDEPENDENT_AMBULATORY_CARE_PROVIDER_SITE_OTHER): Payer: Medicare Other | Admitting: Internal Medicine

## 2020-06-30 ENCOUNTER — Encounter: Payer: Self-pay | Admitting: Internal Medicine

## 2020-06-30 ENCOUNTER — Encounter: Payer: Self-pay | Admitting: *Deleted

## 2020-06-30 ENCOUNTER — Other Ambulatory Visit: Payer: Self-pay

## 2020-06-30 VITALS — BP 138/74 | HR 63 | Ht 60.0 in | Wt 140.2 lb

## 2020-06-30 DIAGNOSIS — I25119 Atherosclerotic heart disease of native coronary artery with unspecified angina pectoris: Secondary | ICD-10-CM | POA: Diagnosis not present

## 2020-06-30 DIAGNOSIS — I480 Paroxysmal atrial fibrillation: Secondary | ICD-10-CM | POA: Diagnosis not present

## 2020-06-30 DIAGNOSIS — I4891 Unspecified atrial fibrillation: Secondary | ICD-10-CM | POA: Diagnosis not present

## 2020-06-30 NOTE — Patient Instructions (Addendum)
Medication Instructions:  Stop your Plavix  Please refer to the Current Medication list given to you today.  Labwork: CBC, BMP  Testing/Procedures: Your physician has requested that you have cardiac CT. Cardiac computed tomography (CT) is a painless test that uses an x-ray machine to take clear, detailed pictures of your heart. For further information please visit [email or web address]. Please follow instruction sheet as given.   Any Other Special Instructions Will Be Listed Below (If Applicable).  If you need a refill on your cardiac medications before your next appointment, please call your pharmacy.    Cardiac Ablation Cardiac ablation is a procedure to destroy (ablate) some heart tissue that is sending bad signals. These bad signals cause problems in heart rhythm. The heart has many areas that make these signals. If there are problems in these areas, they can make the heart beat in a way that is not normal. Destroying some tissues can help make the heart rhythm normal. Tell your doctor about:  Any allergies you have.  All medicines you are taking. These include vitamins, herbs, eye drops, creams, and over-the-counter medicines.  Any problems you or family members have had with medicines that make you fall asleep (anesthetics).  Any blood disorders you have.  Any surgeries you have had.  Any medical conditions you have, such as kidney failure.  Whether you are pregnant or may be pregnant. What are the risks? This is a safe procedure. But problems may occur, including:  Infection.  Bruising and bleeding.  Bleeding into the chest.  Stroke or blood clots.  Damage to nearby areas of your body.  Allergies to medicines or dyes.  The need for a pacemaker if the normal system is damaged.  Failure of the procedure to treat the problem. What happens before the procedure? Medicines Ask your doctor about:  Changing or stopping your normal medicines. This is  important.  Taking aspirin and ibuprofen. Do not take these medicines unless your doctor tells you to take them.  Taking other medicines, vitamins, herbs, and supplements. General instructions  Follow instructions from your doctor about what you cannot eat or drink.  Plan to have someone take you home from the hospital or clinic.  If you will be going home right after the procedure, plan to have someone with you for 24 hours.  Ask your doctor what steps will be taken to prevent infection. What happens during the procedure?  An IV tube will be put into one of your veins.  You will be given a medicine to help you relax.  The skin on your neck or groin will be numbed.  A cut (incision) will be made in your neck or groin. A needle will be put through your cut and into a large vein.  A tube (catheter) will be put into the needle. The tube will be moved to your heart.  Dye may be put through the tube. This helps your doctor see your heart.  Small devices (electrodes) on the tube will send out signals.  A type of energy will be used to destroy some heart tissue.  The tube will be taken out.  Pressure will be held on your cut. This helps stop bleeding.  A bandage will be put over your cut. The exact procedure may vary among doctors and hospitals.   What happens after the procedure?  You will be watched until you leave the hospital or clinic. This includes checking your heart rate, breathing rate, oxygen, and blood pressure.  Your  cut will be watched for bleeding. You will need to lie still for a few hours.  Do not drive for 24 hours or as long as your doctor tells you. Summary  Cardiac ablation is a procedure to destroy some heart tissue. This is done to treat heart rhythm problems.  Tell your doctor about any medical conditions you may have. Tell him or her about all medicines you are taking to treat them.  This is a safe procedure. But problems may occur. These include  infection, bruising, bleeding, and damage to nearby areas of your body.  Follow what your doctor tells you about food and drink. You may also be told to change or stop some of your medicines.  After the procedure, do not drive for 24 hours or as long as your doctor tells you. This information is not intended to replace advice given to you by your health care provider. Make sure you discuss any questions you have with your health care provider. Document Revised: 04/24/2019 Document Reviewed: 04/24/2019 Elsevier Patient Education  2021 [employer].

## 2020-06-30 NOTE — Telephone Encounter (Signed)
Pt in office today seeing Dr Rayann Heman and inquired about her Nexletol copay. She received a message on her phone that her copay will cost $450. Confirmed with her [facility] insurance that prior authorization is not needed. Her [employer] is also still active through 08/01/20. Called pharmacy stated her copay was $450 and they had for some reason had stopped applying the [employer]. They have fixed this so pt's next copay will be $0. She was appreciative for the assistance.

## 2020-06-30 NOTE — H&P (View-Only) (Signed)
PCP: [name], MD Primary Cardiologist: Dr Acie Fredrickson Primary EP: Dr Rayann Heman  Shannon Cummings is a 75 y.o. female who presents today for routine electrophysiology followup.  Since last being seen in our clinic, the patient reports doing very well.  Today, she denies symptoms of palpitations, chest pain, shortness of breath,  lower extremity edema, dizziness, presyncope, or syncope.  The patient is otherwise without complaint today.   Past Medical History:  Diagnosis Date  . Arthritis   . CAD (coronary artery disease)    a. prior stenting history. b. NSTEMI s/p DES to prox LAD with EF 45% by cath 08/14/12. c. Mild trop elevation shortly after cath ?mild plaque embolization - patent stent on relook. // d. Myoview 8/18: EF 60, normal perfusion; Low Risk. e. NSTEMI 06/2018- patent prior LAD stent, 80% OM1 s/p DES, normal LVEDP, EF 45-50%, moderate residual disease treated medically.  . Fatty infiltration of liver   . GERD (gastroesophageal reflux disease)   . Hyperlipidemia   . Hypertension   . Hypokalemia   . Hypotension   . Myocardial infarction ([place]) 2014  . OSA on CPAP   . Osteoporosis   . PAF (paroxysmal atrial fibrillation) ([place])    On rythmol previously  . Pulmonary nodule    a. 64mm subpleural nodular density CT 08/2012, instructed to f/u pulm MD.  . Sinus bradycardia   . Tubular adenoma of colon 2007   Past Surgical History:  Procedure Laterality Date  . CORONARY ANGIOPLASTY WITH STENT PLACEMENT  08/14/2012   LAD  Dr [name]  . CORONARY STENT INTERVENTION N/A 06/14/2018   Procedure: CORONARY STENT INTERVENTION;  Surgeon: Jettie Booze, MD;  Location: [facility];  Service: Cardiovascular;  Laterality: N/A;  . CORONARY STENT PLACEMENT  2000  . hair restoration  02/2017  . LEFT HEART CATH AND CORONARY ANGIOGRAPHY N/A 06/14/2018   Procedure: LEFT HEART CATH AND CORONARY ANGIOGRAPHY;  Surgeon: Jettie Booze, MD;  Location: [facility];   Service: Cardiovascular;  Laterality: N/A;  . LEFT HEART CATH AND CORONARY ANGIOGRAPHY N/A 05/14/2019   Procedure: LEFT HEART CATH AND CORONARY ANGIOGRAPHY;  Surgeon: [name], MD;  Location: [facility];  Service: Cardiovascular;  Laterality: N/A;  . LEFT HEART CATHETERIZATION WITH CORONARY ANGIOGRAM N/A 08/14/2012   Procedure: LEFT HEART CATHETERIZATION WITH CORONARY ANGIOGRAM;  Surgeon: [name], MD;  Location: [facility];  Service: Cardiovascular;  Laterality: N/A;  . LEFT HEART CATHETERIZATION WITH CORONARY ANGIOGRAM N/A 08/19/2012   Procedure: LEFT HEART CATHETERIZATION WITH CORONARY ANGIOGRAM;  Surgeon: [name], MD;  [facility];  Service: Cardiovascular;  Laterality: N/A;  . ROTATOR CUFF REPAIR    . TONSILLECTOMY    . TOTAL ABDOMINAL HYSTERECTOMY      ROS- all systems are reviewed and negatives except as per HPI above  Current Outpatient Medications  Medication Sig Dispense Refill  . acetaminophen (TYLENOL) 650 MG CR tablet Take 650 mg by mouth every 8 (eight) hours as needed for pain.    . Alirocumab (PRALUENT) 75 MG/ML SOAJ Inject 75 mg into the skin every 14 (fourteen) days. 2 pen 11  . amiodarone (PACERONE) 100 MG tablet Take 1 tablet (100 mg total) by mouth daily. 90 tablet 3  . Bempedoic Acid (NEXLETOL) 180 MG TABS Take 1 tablet by mouth daily. 90 tablet 1  . Cholecalciferol (VITAMIN D3) 25 MCG (1000 UT) CAPS Take 1,000 Units by mouth daily.    . clopidogrel (  PLAVIX) 75 MG tablet TAKE 1 TABLET(75 MG) BY MOUTH DAILY 90 tablet 3  . Cyanocobalamin (B-12) 1000 MCG LOZG Take 1,000 mcg by mouth daily.     Marland Kitchen dicyclomine (BENTYL) 20 MG tablet TID prn abdominal pain/spasm 60 tablet 0  . ELIQUIS 5 MG TABS tablet TAKE 1 TABLET(5 MG) BY MOUTH TWICE DAILY 180 tablet 1  . fluticasone (FLONASE) 50 MCG/ACT nasal spray Place 2 sprays into both nostrils daily as needed for allergies or rhinitis. 48 g 1  . losartan (COZAAR) 25 MG tablet TAKE 2 TABLETS(50 MG) BY  MOUTH DAILY 180 tablet 3  . nitroGLYCERIN (NITROSTAT) 0.4 MG SL tablet DISSOLVE ONE TABLET UNDER TONGUE AS NEEDED FOR CHEST PAIN FOR UP TO 3 DOSES 25 tablet 3  . ondansetron (ZOFRAN ODT) 4 MG disintegrating tablet Take 1 tablet (4 mg total) by mouth every 8 (eight) hours as needed. 20 tablet 0  . pantoprazole (PROTONIX) 40 MG tablet TAKE 1 TABLET(40 MG) BY MOUTH DAILY 90 tablet 1  . potassium chloride (KLOR-CON) 10 MEQ tablet TAKE 1 TABLET(10 MEQ) BY MOUTH DAILY 90 tablet 3  . propranolol (INDERAL) 10 MG tablet Take 10 mg by mouth 4 (four) times daily as needed (for A Fib Attacks).    . saccharomyces boulardii (FLORASTOR) 250 MG capsule Take 1 capsule (250 mg total) by mouth 2 (two) times daily. 90 capsule 0  . spironolactone (ALDACTONE) 25 MG tablet TAKE 1/2 TABLET(12.5 MG) BY MOUTH DAILY 90 tablet 1  . amLODipine (NORVASC) 2.5 MG tablet Take 1 tablet (2.5 mg total) by mouth daily. 30 tablet 11   No current facility-administered medications for this visit.    Physical Exam: Vitals:   06/30/20 1014  BP: 138/74  Pulse: 63  SpO2: 99%  Weight: 140 lb 3.2 oz (63.6 kg)  Height: 5' (1.524 m)    GEN- The patient is well appearing, alert and oriented x 3 today.   Head- normocephalic, atraumatic Eyes-  Sclera clear, conjunctiva pink Ears- hearing intact Oropharynx- clear Lungs- Clear to ausculation bilaterally, normal work of breathing Heart- Regular rate and rhythm, no murmurs, rubs or gallops, PMI not laterally displaced GI- soft, NT, ND, + BS Extremities- no clubbing, cyanosis, or edema  Wt Readings from Last 3 Encounters:  06/30/20 140 lb 3.2 oz (63.6 kg)  05/20/20 141 lb 8 oz (64.2 kg)  05/03/20 139 lb 6.4 oz (63.2 kg)   Cath 05/14/19- reviewed Echo 06/14/18- EF 40%  EKG tracing ordered today is personally reviewed and shows sinus  Assessment and Plan:  1. Paroxysmal atrial fibrillation The patient has symptomatic, recurrent paroxysmal atrial fibrillation. she has failed  medical therapy with rhythmol.  Now on amiodarone.  She did have afib while on amiodarone in December. Chads2vasc score is 5.  she is anticoagulated with eliquis . Therapeutic strategies for afib including medicine and ablation were discussed in detail with the patient today. Risk, benefits, and alternatives to EP study and radiofrequency ablation for afib were also discussed in detail today. These risks include but are not limited to stroke, bleeding, vascular damage, tamponade, perforation, damage to the esophagus, lungs, and other structures, pulmonary vein stenosis, worsening renal function, and death. The patient understands these risk and wishes to proceed.  We will therefore proceed with catheter ablation at the next available time.  Carto, ICE, anesthesia are requested for the procedure.  Will also obtain cardiac CT prior to the procedure to exclude LAA thrombus and further evaluate atrial anatomy.  2. CAD S/p  prior PCI As per Dr Antionette Char cath note from 2020, we will stop plavix at this time.  3. HL Stable No change required today  4. Ischemic CM Ef previously 45% Would consider repeating echo after medicine optimization  Risks, benefits and potential toxicities for medications prescribed and/or refilled reviewed with patient today.   Thompson Grayer MD, [facility] 06/30/2020 10:23 AM

## 2020-06-30 NOTE — Progress Notes (Signed)
 PCP: Tabori, Katherine E, MD Primary Cardiologist: Dr Nahser Primary EP: Dr Dacen Frayre  Shannon Cummings is a 75 y.o. female who presents today for routine electrophysiology followup.  Since last being seen in our clinic, the patient reports doing very well.  Today, she denies symptoms of palpitations, chest pain, shortness of breath,  lower extremity edema, dizziness, presyncope, or syncope.  The patient is otherwise without complaint today.   Past Medical History:  Diagnosis Date  . Arthritis   . CAD (coronary artery disease)    a. prior stenting history. b. NSTEMI s/p DES to prox LAD with EF 45% by cath 08/14/12. c. Mild trop elevation shortly after cath ?mild plaque embolization - patent stent on relook. // d. Myoview 8/18: EF 60, normal perfusion; Low Risk. e. NSTEMI 06/2018- patent prior LAD stent, 80% OM1 s/p DES, normal LVEDP, EF 45-50%, moderate residual disease treated medically.  . Fatty infiltration of liver   . GERD (gastroesophageal reflux disease)   . Hyperlipidemia   . Hypertension   . Hypokalemia   . Hypotension   . Myocardial infarction ([facility]) 2014  . OSA on CPAP   . Osteoporosis   . PAF (paroxysmal atrial fibrillation) ([facility])    On rythmol previously  . Pulmonary nodule    a. 8mm subpleural nodular density CT 08/2012, instructed to f/u pulm MD.  . Sinus bradycardia   . Tubular adenoma of colon 2007   Past Surgical History:  Procedure Laterality Date  . CORONARY ANGIOPLASTY WITH STENT PLACEMENT  08/14/2012   LAD  Dr Michael Cooper  . CORONARY STENT INTERVENTION N/A 06/14/2018   Procedure: CORONARY STENT INTERVENTION;  Surgeon: Varanasi, Jayadeep S, MD;  Location: [facility];  Service: Cardiovascular;  Laterality: N/A;  . CORONARY STENT PLACEMENT  2000  . hair restoration  02/2017  . LEFT HEART CATH AND CORONARY ANGIOGRAPHY N/A 06/14/2018   Procedure: LEFT HEART CATH AND CORONARY ANGIOGRAPHY;  Surgeon: Varanasi, Jayadeep S, MD;  Location: [facility];   Service: Cardiovascular;  Laterality: N/A;  . LEFT HEART CATH AND CORONARY ANGIOGRAPHY N/A 05/14/2019   Procedure: LEFT HEART CATH AND CORONARY ANGIOGRAPHY;  Surgeon: Cooper, Michael, MD;  Location: [facility];  Service: Cardiovascular;  Laterality: N/A;  . LEFT HEART CATHETERIZATION WITH CORONARY ANGIOGRAM N/A 08/14/2012   Procedure: LEFT HEART CATHETERIZATION WITH CORONARY ANGIOGRAM;  Surgeon: Michael Cooper, MD;  Location: [facility];  Service: Cardiovascular;  Laterality: N/A;  . LEFT HEART CATHETERIZATION WITH CORONARY ANGIOGRAM N/A 08/19/2012   Procedure: LEFT HEART CATHETERIZATION WITH CORONARY ANGIOGRAM;  Surgeon: Michael Cooper, MD;  Location: [facility];  Service: Cardiovascular;  Laterality: N/A;  . ROTATOR CUFF REPAIR    . TONSILLECTOMY    . TOTAL ABDOMINAL HYSTERECTOMY      ROS- all systems are reviewed and negatives except as per HPI above  Current Outpatient Medications  Medication Sig Dispense Refill  . acetaminophen (TYLENOL) 650 MG CR tablet Take 650 mg by mouth every 8 (eight) hours as needed for pain.    . Alirocumab (PRALUENT) 75 MG/ML SOAJ Inject 75 mg into the skin every 14 (fourteen) days. 2 pen 11  . amiodarone (PACERONE) 100 MG tablet Take 1 tablet (100 mg total) by mouth daily. 90 tablet 3  . Bempedoic Acid (NEXLETOL) 180 MG TABS Take 1 tablet by mouth daily. 90 tablet 1  . Cholecalciferol (VITAMIN D3) 25 MCG (1000 UT) CAPS Take 1,000 Units by mouth daily.    . clopidogrel (  PLAVIX) 75 MG tablet TAKE 1 TABLET(75 MG) BY MOUTH DAILY 90 tablet 3  . Cyanocobalamin (B-12) 1000 MCG LOZG Take 1,000 mcg by mouth daily.     Marland Kitchen dicyclomine (BENTYL) 20 MG tablet TID prn abdominal pain/spasm 60 tablet 0  . ELIQUIS 5 MG TABS tablet TAKE 1 TABLET(5 MG) BY MOUTH TWICE DAILY 180 tablet 1  . fluticasone (FLONASE) 50 MCG/ACT nasal spray Place 2 sprays into both nostrils daily as needed for allergies or rhinitis. 48 g 1  . losartan (COZAAR) 25 MG tablet TAKE 2 TABLETS(50 MG) BY  MOUTH DAILY 180 tablet 3  . nitroGLYCERIN (NITROSTAT) 0.4 MG SL tablet DISSOLVE ONE TABLET UNDER TONGUE AS NEEDED FOR CHEST PAIN FOR UP TO 3 DOSES 25 tablet 3  . ondansetron (ZOFRAN ODT) 4 MG disintegrating tablet Take 1 tablet (4 mg total) by mouth every 8 (eight) hours as needed. 20 tablet 0  . pantoprazole (PROTONIX) 40 MG tablet TAKE 1 TABLET(40 MG) BY MOUTH DAILY 90 tablet 1  . potassium chloride (KLOR-CON) 10 MEQ tablet TAKE 1 TABLET(10 MEQ) BY MOUTH DAILY 90 tablet 3  . propranolol (INDERAL) 10 MG tablet Take 10 mg by mouth 4 (four) times daily as needed (for A Fib Attacks).    . saccharomyces boulardii (FLORASTOR) 250 MG capsule Take 1 capsule (250 mg total) by mouth 2 (two) times daily. 90 capsule 0  . spironolactone (ALDACTONE) 25 MG tablet TAKE 1/2 TABLET(12.5 MG) BY MOUTH DAILY 90 tablet 1  . amLODipine (NORVASC) 2.5 MG tablet Take 1 tablet (2.5 mg total) by mouth daily. 30 tablet 11   No current facility-administered medications for this visit.    Physical Exam: Vitals:   06/30/20 1014  BP: 138/74  Pulse: 63  SpO2: 99%  Weight: 140 lb 3.2 oz (63.6 kg)  Height: 5' (1.524 m)    GEN- The patient is well appearing, alert and oriented x 3 today.   Head- normocephalic, atraumatic Eyes-  Sclera clear, conjunctiva pink Ears- hearing intact Oropharynx- clear Lungs- Clear to ausculation bilaterally, normal work of breathing Heart- Regular rate and rhythm, no murmurs, rubs or gallops, PMI not laterally displaced GI- soft, NT, ND, + BS Extremities- no clubbing, cyanosis, or edema  Wt Readings from Last 3 Encounters:  06/30/20 140 lb 3.2 oz (63.6 kg)  05/20/20 141 lb 8 oz (64.2 kg)  05/03/20 139 lb 6.4 oz (63.2 kg)   Cath 05/14/19- reviewed Echo 06/14/18- EF 40%  EKG tracing ordered today is personally reviewed and shows sinus  Assessment and Plan:  1. Paroxysmal atrial fibrillation The patient has symptomatic, recurrent paroxysmal atrial fibrillation. she has failed  medical therapy with rhythmol.  Now on amiodarone.  She did have afib while on amiodarone in December. Chads2vasc score is 5.  she is anticoagulated with eliquis . Therapeutic strategies for afib including medicine and ablation were discussed in detail with the patient today. Risk, benefits, and alternatives to EP study and radiofrequency ablation for afib were also discussed in detail today. These risks include but are not limited to stroke, bleeding, vascular damage, tamponade, perforation, damage to the esophagus, lungs, and other structures, pulmonary vein stenosis, worsening renal function, and death. The patient understands these risk and wishes to proceed.  We will therefore proceed with catheter ablation at the next available time.  Carto, ICE, anesthesia are requested for the procedure.  Will also obtain cardiac CT prior to the procedure to exclude LAA thrombus and further evaluate atrial anatomy.  2. CAD S/p  prior PCI As per Dr Antionette Char cath note from 2020, we will stop plavix at this time.  3. HL Stable No change required today  4. Ischemic CM Ef previously 45% Would consider repeating echo after medicine optimization  Risks, benefits and potential toxicities for medications prescribed and/or refilled reviewed with patient today.   Thompson Grayer MD, [facility] 06/30/2020 10:23 AM

## 2020-07-01 LAB — BASIC METABOLIC PANEL
BUN/Creatinine Ratio: 16 (ref 12–28)
BUN: 13 mg/dL (ref 8–27)
CO2: 23 mmol/L (ref 20–29)
Calcium: 9.6 mg/dL (ref 8.7–10.3)
Chloride: 95 mmol/L — ABNORMAL LOW (ref 96–106)
Creatinine, Ser: 0.81 mg/dL (ref 0.57–1.00)
GFR calc Af Amer: 83 mL/min/{1.73_m2} (ref >59)
GFR calc non Af Amer: 72 mL/min/{1.73_m2} (ref >59)
Glucose: 91 mg/dL (ref 65–99)
Potassium: 4.8 mmol/L (ref 3.5–5.2)
Sodium: 134 mmol/L (ref 134–144)

## 2020-07-01 LAB — CBC WITH DIFFERENTIAL/PLATELET
Basophils Absolute: 0 10*3/uL (ref 0.0–0.2)
Basos: 1 %
EOS (ABSOLUTE): 0.1 10*3/uL (ref 0.0–0.4)
Eos: 1 %
Hematocrit: 39.7 % (ref 34.0–46.6)
Hemoglobin: 13.9 g/dL (ref 11.1–15.9)
Immature Grans (Abs): 0 10*3/uL (ref 0.0–0.1)
Immature Granulocytes: 1 %
Lymphocytes Absolute: 2.8 10*3/uL (ref 0.7–3.1)
Lymphs: 44 %
MCH: 30.5 pg (ref 26.6–33.0)
MCHC: 35 g/dL (ref 31.5–35.7)
MCV: 87 fL (ref 79–97)
Monocytes Absolute: 0.5 10*3/uL (ref 0.1–0.9)
Monocytes: 8 %
Neutrophils Absolute: 2.8 10*3/uL (ref 1.4–7.0)
Neutrophils: 45 %
Platelets: 230 10*3/uL (ref 150–450)
RBC: 4.56 x10E6/uL (ref 3.77–5.28)
RDW: 12.6 % (ref 11.7–15.4)
WBC: 6.3 10*3/uL (ref 3.4–10.8)

## 2020-07-12 DIAGNOSIS — R350 Frequency of micturition: Secondary | ICD-10-CM | POA: Diagnosis not present

## 2020-07-16 ENCOUNTER — Telehealth (HOSPITAL_COMMUNITY): Payer: Self-pay | Admitting: Emergency Medicine

## 2020-07-16 NOTE — Telephone Encounter (Signed)
Reaching out to patient to offer assistance regarding upcoming cardiac imaging study; pt verbalizes understanding of appt date/time, parking situation and where to check in, pre-test NPO status and medications ordered, and verified current allergies; name and call back number provided for further questions should they arise Marchia Bond RN [facility] [phone] office [phone] cell   Holding flonase and spironolactone Clarise Cruz

## 2020-07-20 ENCOUNTER — Ambulatory Visit (HOSPITAL_COMMUNITY)
Admission: RE | Admit: 2020-07-20 | Discharge: 2020-07-20 | Disposition: A | Discharge status: Home / Self Care | Payer: Medicare Other | Source: Ambulatory Visit | Attending: Internal Medicine | Admitting: Internal Medicine

## 2020-07-20 ENCOUNTER — Other Ambulatory Visit: Payer: Self-pay

## 2020-07-20 DIAGNOSIS — I4891 Unspecified atrial fibrillation: Secondary | ICD-10-CM | POA: Insufficient documentation

## 2020-07-20 MED ORDER — IOHEXOL 350 MG/ML SOLN
80.0000 mL | Freq: Once | INTRAVENOUS | Status: AC | PRN
  Administered 2020-07-20: 80 mL via INTRAVENOUS

## 2020-07-26 ENCOUNTER — Telehealth: Payer: Self-pay | Admitting: *Deleted

## 2020-07-26 ENCOUNTER — Other Ambulatory Visit (HOSPITAL_COMMUNITY)
Admission: RE | Admit: 2020-07-26 | Discharge: 2020-07-26 | Disposition: A | Discharge status: Home / Self Care | Payer: Medicare Other | Source: Ambulatory Visit | Attending: Internal Medicine | Admitting: Internal Medicine

## 2020-07-26 DIAGNOSIS — Z20822 Contact with and (suspected) exposure to covid-19: Secondary | ICD-10-CM | POA: Diagnosis not present

## 2020-07-26 DIAGNOSIS — Z01812 Encounter for preprocedural laboratory examination: Secondary | ICD-10-CM | POA: Insufficient documentation

## 2020-07-26 LAB — SARS CORONAVIRUS 2 (TAT 6-24 HRS): SARS Coronavirus 2: NEGATIVE

## 2020-07-26 NOTE — Progress Notes (Signed)
Instructed patient on the following items: Arrival time 0530 Nothing to eat or drink after midnight No meds AM of procedure Responsible person to drive you home and stay with you for 24 hrs  Have you missed any doses of anti-coagulant Eliquis- hasn't missed any doses    

## 2020-07-26 NOTE — Telephone Encounter (Signed)
When calling subject to remind them of their CLEAR visit T20, M54, Shannon Cummings has decided to stop coming in for appointments since she's no longer on IP. However, she did agree to remain in study on telephone only for the remainder of the study. All concomitant medications have been reviewed and updated if applicable. There are no new AE's or SAE's to report to sponsor at this time. Next telephone call will be in approximately 3 months.

## 2020-07-27 ENCOUNTER — Ambulatory Visit (HOSPITAL_COMMUNITY): Payer: Medicare Other | Admitting: Anesthesiology

## 2020-07-27 ENCOUNTER — Encounter (HOSPITAL_COMMUNITY): Payer: Self-pay | Admitting: Internal Medicine

## 2020-07-27 ENCOUNTER — Ambulatory Visit (HOSPITAL_COMMUNITY)
Admission: RE | Admit: 2020-07-27 | Discharge: 2020-07-27 | Disposition: A | Discharge status: Home / Self Care | Payer: Medicare Other | Attending: Internal Medicine | Admitting: Internal Medicine

## 2020-07-27 ENCOUNTER — Other Ambulatory Visit: Payer: Self-pay

## 2020-07-27 ENCOUNTER — Encounter (HOSPITAL_COMMUNITY)
Admission: RE | Disposition: A | Discharge status: Home / Self Care | Payer: Self-pay | Source: Home / Self Care | Attending: Internal Medicine

## 2020-07-27 DIAGNOSIS — Z955 Presence of coronary angioplasty implant and graft: Secondary | ICD-10-CM | POA: Insufficient documentation

## 2020-07-27 DIAGNOSIS — Z7901 Long term (current) use of anticoagulants: Secondary | ICD-10-CM | POA: Insufficient documentation

## 2020-07-27 DIAGNOSIS — Z86010 Personal history of colonic polyps: Secondary | ICD-10-CM | POA: Insufficient documentation

## 2020-07-27 DIAGNOSIS — I255 Ischemic cardiomyopathy: Secondary | ICD-10-CM | POA: Diagnosis not present

## 2020-07-27 DIAGNOSIS — I252 Old myocardial infarction: Secondary | ICD-10-CM | POA: Insufficient documentation

## 2020-07-27 DIAGNOSIS — Z7902 Long term (current) use of antithrombotics/antiplatelets: Secondary | ICD-10-CM | POA: Insufficient documentation

## 2020-07-27 DIAGNOSIS — Z79899 Other long term (current) drug therapy: Secondary | ICD-10-CM | POA: Diagnosis not present

## 2020-07-27 DIAGNOSIS — Z90710 Acquired absence of both cervix and uterus: Secondary | ICD-10-CM | POA: Insufficient documentation

## 2020-07-27 DIAGNOSIS — K760 Fatty (change of) liver, not elsewhere classified: Secondary | ICD-10-CM | POA: Diagnosis not present

## 2020-07-27 DIAGNOSIS — E785 Hyperlipidemia, unspecified: Secondary | ICD-10-CM | POA: Diagnosis not present

## 2020-07-27 DIAGNOSIS — I10 Essential (primary) hypertension: Secondary | ICD-10-CM | POA: Insufficient documentation

## 2020-07-27 DIAGNOSIS — I2510 Atherosclerotic heart disease of native coronary artery without angina pectoris: Secondary | ICD-10-CM | POA: Insufficient documentation

## 2020-07-27 DIAGNOSIS — G4733 Obstructive sleep apnea (adult) (pediatric): Secondary | ICD-10-CM | POA: Diagnosis not present

## 2020-07-27 DIAGNOSIS — I480 Paroxysmal atrial fibrillation: Secondary | ICD-10-CM | POA: Insufficient documentation

## 2020-07-27 DIAGNOSIS — I25110 Atherosclerotic heart disease of native coronary artery with unstable angina pectoris: Secondary | ICD-10-CM | POA: Diagnosis not present

## 2020-07-27 HISTORY — PX: ATRIAL FIBRILLATION ABLATION: EP1191

## 2020-07-27 LAB — POCT ACTIVATED CLOTTING TIME: Activated Clotting Time: 517 seconds

## 2020-07-27 SURGERY — ATRIAL FIBRILLATION ABLATION
Anesthesia: General

## 2020-07-27 MED ORDER — PHENYLEPHRINE 40 MCG/ML (10ML) SYRINGE FOR IV PUSH (FOR BLOOD PRESSURE SUPPORT)
PREFILLED_SYRINGE | INTRAVENOUS | Status: DC | PRN
  Administered 2020-07-27: 80 ug via INTRAVENOUS

## 2020-07-27 MED ORDER — ISOPROTERENOL HCL 0.2 MG/ML IJ SOLN
INTRAVENOUS | Status: DC | PRN
  Administered 2020-07-27: 20 ug/min via INTRAVENOUS

## 2020-07-27 MED ORDER — ISOPROTERENOL HCL 0.2 MG/ML IJ SOLN
INTRAMUSCULAR | Status: AC
  Filled 2020-07-27: qty 5

## 2020-07-27 MED ORDER — APIXABAN 5 MG PO TABS
5.0000 mg | ORAL_TABLET | ORAL | Status: AC
  Administered 2020-07-27: 5 mg via ORAL
  Filled 2020-07-27: qty 1

## 2020-07-27 MED ORDER — PHENYLEPHRINE HCL-NACL 10-0.9 MG/250ML-% IV SOLN
INTRAVENOUS | Status: DC | PRN
  Administered 2020-07-27: 40 ug/min via INTRAVENOUS

## 2020-07-27 MED ORDER — DIPHENHYDRAMINE HCL 50 MG/ML IJ SOLN
6.2500 mg | Freq: Once | INTRAMUSCULAR | Status: AC
  Administered 2020-07-27: 6.5 mg via INTRAVENOUS

## 2020-07-27 MED ORDER — PROTAMINE SULFATE 10 MG/ML IV SOLN
INTRAVENOUS | Status: DC | PRN
  Administered 2020-07-27: 40 mg via INTRAVENOUS

## 2020-07-27 MED ORDER — PROPOFOL 10 MG/ML IV BOLUS
INTRAVENOUS | Status: DC | PRN
  Administered 2020-07-27: 80 mg via INTRAVENOUS

## 2020-07-27 MED ORDER — HEPARIN (PORCINE) IN NACL 1000-0.9 UT/500ML-% IV SOLN
INTRAVENOUS | Status: DC | PRN
  Administered 2020-07-27: 500 mL

## 2020-07-27 MED ORDER — SODIUM CHLORIDE 0.9% FLUSH: 3.0000 mL | Freq: Two times a day (BID) | INTRAVENOUS | Status: DC

## 2020-07-27 MED ORDER — DIPHENHYDRAMINE HCL 50 MG/ML IJ SOLN
INTRAMUSCULAR | Status: AC
  Filled 2020-07-27: qty 1

## 2020-07-27 MED ORDER — ONDANSETRON HCL 4 MG/2ML IJ SOLN: 4.0000 mg | Freq: Four times a day (QID) | INTRAMUSCULAR | Status: DC | PRN

## 2020-07-27 MED ORDER — DEXAMETHASONE SODIUM PHOSPHATE 10 MG/ML IJ SOLN
INTRAMUSCULAR | Status: DC | PRN
  Administered 2020-07-27: 5 mg via INTRAVENOUS

## 2020-07-27 MED ORDER — LIDOCAINE 2% (20 MG/ML) 5 ML SYRINGE
INTRAMUSCULAR | Status: DC | PRN
  Administered 2020-07-27: 40 mg via INTRAVENOUS

## 2020-07-27 MED ORDER — ACETAMINOPHEN 325 MG PO TABS: 650.0000 mg | ORAL_TABLET | ORAL | Status: DC | PRN

## 2020-07-27 MED ORDER — METHYLPREDNISOLONE SODIUM SUCC 125 MG IJ SOLR
INTRAMUSCULAR | Status: AC
  Filled 2020-07-27: qty 2

## 2020-07-27 MED ORDER — ROCURONIUM BROMIDE 10 MG/ML (PF) SYRINGE
PREFILLED_SYRINGE | INTRAVENOUS | Status: DC | PRN
  Administered 2020-07-27: 60 mg via INTRAVENOUS

## 2020-07-27 MED ORDER — SODIUM CHLORIDE 0.9 % IV SOLN: INTRAVENOUS | Status: DC

## 2020-07-27 MED ORDER — HEPARIN SODIUM (PORCINE) 1000 UNIT/ML IJ SOLN
INTRAMUSCULAR | Status: AC
  Filled 2020-07-27: qty 2

## 2020-07-27 MED ORDER — HEPARIN SODIUM (PORCINE) 1000 UNIT/ML IJ SOLN
INTRAMUSCULAR | Status: DC | PRN
  Administered 2020-07-27: 15000 [IU] via INTRAVENOUS
  Administered 2020-07-27: 1000 [IU] via INTRAVENOUS

## 2020-07-27 MED ORDER — SUGAMMADEX SODIUM 200 MG/2ML IV SOLN
INTRAVENOUS | Status: DC | PRN
  Administered 2020-07-27: 200 mg via INTRAVENOUS

## 2020-07-27 MED ORDER — SODIUM CHLORIDE 0.9% FLUSH: 3.0000 mL | INTRAVENOUS | Status: DC | PRN

## 2020-07-27 MED ORDER — HYDROCODONE-ACETAMINOPHEN 5-325 MG PO TABS: 1.0000 | ORAL_TABLET | ORAL | Status: DC | PRN

## 2020-07-27 MED ORDER — SODIUM CHLORIDE 0.9 % IV SOLN: 250.0000 mL | INTRAVENOUS | Status: DC | PRN

## 2020-07-27 MED ORDER — HEPARIN (PORCINE) IN NACL 1000-0.9 UT/500ML-% IV SOLN
INTRAVENOUS | Status: AC
  Filled 2020-07-27: qty 500

## 2020-07-27 MED ORDER — FENTANYL CITRATE (PF) 250 MCG/5ML IJ SOLN
INTRAMUSCULAR | Status: DC | PRN
  Administered 2020-07-27: 50 ug via INTRAVENOUS

## 2020-07-27 MED ORDER — ONDANSETRON HCL 4 MG/2ML IJ SOLN
INTRAMUSCULAR | Status: DC | PRN
  Administered 2020-07-27: 4 mg via INTRAVENOUS

## 2020-07-27 SURGICAL SUPPLY — 19 items
BLANKET WARM UNDERBOD FULL ACC (MISCELLANEOUS) ×3 IMPLANT
CATH MAPPNG PENTARAY F 2-6-2MM (CATHETERS) IMPLANT
CATH SMTCH THERMOCOOL SF DF (CATHETERS) ×1 IMPLANT
CATH SOUNDSTAR ECO 8FR (CATHETERS) ×1 IMPLANT
CATH WEBSTER BI DIR CS D-F CRV (CATHETERS) ×1 IMPLANT
CLOSURE PERCLOSE PROSTYLE (VASCULAR PRODUCTS) ×2 IMPLANT
COVER SWIFTLINK CONNECTOR (BAG) ×2 IMPLANT
NDL BAYLIS TRANSSEPTAL 71CM (NEEDLE) IMPLANT
NEEDLE BAYLIS TRANSSEPTAL 71CM (NEEDLE) ×2 IMPLANT
PACK EP LATEX FREE (CUSTOM PROCEDURE TRAY) ×2
PACK EP LF (CUSTOM PROCEDURE TRAY) ×1 IMPLANT
PAD PRO RADIOLUCENT 2001M-C (PAD) ×2 IMPLANT
PATCH CARTO3 (PAD) ×1 IMPLANT
PENTARAY F 2-6-2MM (CATHETERS) ×2
SHEATH PINNACLE 7F 10CM (SHEATH) ×2 IMPLANT
SHEATH PINNACLE 9F 10CM (SHEATH) ×1 IMPLANT
SHEATH PROBE COVER 6X72 (BAG) ×1 IMPLANT
SHEATH SWARTZ TS SL2 63CM 8.5F (SHEATH) ×1 IMPLANT
TUBING SMART ABLATE COOLFLOW (TUBING) ×1 IMPLANT

## 2020-07-27 NOTE — Discharge Instructions (Signed)
Cardiac Ablation, Care After This sheet gives you information about how to care for yourself after your procedure. Your health care provider may also give you more specific instructions. If you have problems or questions, contact your health care provider. What can I expect after the procedure? After the procedure, it is common to have:  Bruising around the insertion site.  Tenderness around the insertion site.  Skipped heartbeats.  Tiredness (fatigue). Follow these instructions at home: Insertion site care  Follow instructions from your health care provider about how to take care of your insertion site. Make sure you: ? Wash your hands with soap and water for at least 20 seconds before and after you change your bandage (dressing). If soap and water are not available, use hand sanitizer. ? Change your dressing as told by your health care provider. ? Leave stitches (sutures), skin glue, or adhesive strips in place. These skin closures may need to stay in place for up to 2 weeks. If adhesive strip edges start to loosen and curl up, you may trim the loose edges. Do not remove adhesive strips completely unless your health care provider tells you to do that.  Check your insertion site every day for signs of infection. Check for: ? More redness, swelling, or pain. ? Fluid or blood. ? Warmth. ? Pus or a bad smell.  If your insertion site starts to bleed, lie down on your back, apply firm pressure to the area, and contact your health care provider.   Driving  If you were given a sedative during the procedure, it can affect you for several hours. Do not drive or operate machinery until your health care provider says that it is safe.  Ask your health care provider when it is safe for you to drive again after the procedure.   Activity  Avoid activities that take a lot of effort for at least 3 days after your procedure.  Do not lift anything that is heavier than 10 lb (4.5 kg), or the limit  that you are told, until your health care provider says that it is safe.  Return to your normal activities as told by your health care provider. Ask your health care provider what activities are safe for you.   General instructions  Take over-the-counter and prescription medicines only as told by your health care provider.  Do not use any products that contain nicotine or tobacco, such as cigarettes, e-cigarettes, and chewing tobacco. If you need help quitting, ask your health care provider.  Do not take baths, swim, or use a hot tub until your health care provider approves. Ask your health care provider if you may take showers. You may only be allowed to take sponge baths.  Do not drink alcohol for 24 hours after your procedure.  Keep all follow-up visits as told by your health care provider. This is important. Contact a health care provider if you:  Notice these things around the catheter insertion site: ? More redness, swelling, or pain. ? Fluid or blood that stops after applying firm pressure to the area. ? Warmth when you touch the area. ? Pus or a bad smell.  Have a fever.  Are sweating a lot.  Feel nauseous.  Have pain or numbness in the arm or leg closest to your insertion site. Get help right away if:  Your insertion site suddenly swells.  Your insertion site is bleeding and the bleeding does not stop after applying firm pressure to the area.  You  have chest pain or discomfort that spreads to your neck, jaw, or arm.  You have a fast or irregular heartbeat.  You have shortness of breath.  You are dizzy or light-headed and feel the need to lie down. These symptoms may represent a serious problem that is an emergency. Do not wait to see if the symptoms will go away. Get medical help right away. Call your local emergency services (911 in the [place] Do not drive yourself to the hospital. Summary  After the procedure, it is common to have bruising and tenderness at the  insertion site in your groin or your neck.  Check your insertion site every day for more redness, swelling, or pain. These are signs of infection.  Contact a health care provider if you notice any signs of infection. Also, contact a health care provider if you start sweating a lot, feel nauseous, or have pain or numbness in the arm or leg closest to your insertion site.  Get help right away if your puncture site is bleeding and the bleeding does not stop after applying firm pressure to the area. This is a medical emergency. This information is not intended to replace advice given to you by your health care provider. Make sure you discuss any questions you have with your health care provider. Document Revised: 03/31/2019 Document Reviewed: 03/31/2019 Elsevier Patient Education  [place].

## 2020-07-27 NOTE — Progress Notes (Signed)
Welts fading

## 2020-07-27 NOTE — Progress Notes (Signed)
Dr. Rayann Heman in to see pt. Okay to DC home. Pt ambulated to bathroom, standby assist. Voided and ambulated without difficulty.

## 2020-07-27 NOTE — Anesthesia Preprocedure Evaluation (Addendum)
Anesthesia Evaluation  Patient identified by MRN, date of birth, ID band Patient awake    Reviewed: Allergy & Precautions, NPO status , Patient's Chart, lab work & pertinent test results  Airway Mallampati: II  TM Distance: >3 FB Neck ROM: Full    Dental  (+) Teeth Intact, Dental Advisory Given   Pulmonary sleep apnea and Continuous Positive Airway Pressure Ventilation ,    Pulmonary exam normal        Cardiovascular hypertension, Pt. on medications + CAD and + Cardiac Stents  + dysrhythmias Atrial Fibrillation  Rhythm:Irregular Rate:Normal     Neuro/Psych negative neurological ROS  negative psych ROS   GI/Hepatic Neg liver ROS, GERD  ,  Endo/Other  negative endocrine ROS  Renal/GU negative Renal ROS     Musculoskeletal  (+) Arthritis ,   Abdominal Normal abdominal exam  (+)   Peds  Hematology negative hematology ROS (+)   Anesthesia Other Findings   Reproductive/Obstetrics                            Anesthesia Physical Anesthesia Plan  ASA: III  Anesthesia Plan: General   Post-op Pain Management:    Induction: Intravenous  PONV Risk Score and Plan: Ondansetron, Dexamethasone and Treatment may vary due to age or medical condition  Airway Management Planned: Oral ETT  Additional Equipment: None  Intra-op Plan:   Post-operative Plan: Extubation in OR  Informed Consent: I have reviewed the patients History and Physical, chart, labs and discussed the procedure including the risks, benefits and alternatives for the proposed anesthesia with the patient or authorized representative who has indicated his/her understanding and acceptance.     Dental advisory given  Plan Discussed with: CRNA  Anesthesia Plan Comments:        Anesthesia Quick Evaluation

## 2020-07-27 NOTE — Anesthesia Postprocedure Evaluation (Signed)
Anesthesia Post Note  Patient: Shannon Cummings  Procedure(s) Performed: ATRIAL FIBRILLATION ABLATION (N/A )     Patient location during evaluation: PACU Anesthesia Type: General Level of consciousness: awake and alert Pain management: pain level controlled Vital Signs Assessment: post-procedure vital signs reviewed and stable Respiratory status: spontaneous breathing, nonlabored ventilation, respiratory function stable and patient connected to nasal cannula oxygen Cardiovascular status: blood pressure returned to baseline and stable Postop Assessment: no apparent nausea or vomiting Anesthetic complications: no Comments: Red raised skin markings noted at the end of the procedure (face, chest). IV Benadryl administered in recovery. Resolution noted prior to discharge.   No complications documented.  Last Vitals:  Vitals:   07/27/20 1059 07/27/20 1105  BP:  136/75  Pulse:  62  Resp:  11  Temp: 36.5 C   SpO2:  98%    Last Pain:  Vitals:   07/27/20 1059  TempSrc: Temporal  PainSc: [name]

## 2020-07-27 NOTE — Anesthesia Procedure Notes (Signed)
Procedure Name: Intubation Date/Time: 07/27/2020 7:42 AM Performed by: Kyung Rudd, CRNA Pre-anesthesia Checklist: Patient identified, Emergency Drugs available, Suction available and Patient being monitored Patient Re-evaluated:Patient Re-evaluated prior to induction Oxygen Delivery Method: Circle system utilized Preoxygenation: Pre-oxygenation with 100% oxygen Induction Type: IV induction Ventilation: Mask ventilation without difficulty Laryngoscope Size: Mac and 3 Grade View: Grade II Tube type: Oral Tube size: 7.0 mm Number of attempts: 1 Airway Equipment and Method: Stylet Placement Confirmation: ETT inserted through vocal cords under direct vision,  positive ETCO2 and breath sounds checked- equal and bilateral Secured at: 21 cm Tube secured with: Tape Dental Injury: Teeth and Oropharynx as per pre-operative assessment

## 2020-07-27 NOTE — Interval H&P Note (Signed)
History and Physical Interval Note:  07/27/2020 7:23 AM  Shannon Cummings  has presented today for surgery, with the diagnosis of afib.  The various methods of treatment have been discussed with the patient and family. After consideration of risks, benefits and other options for treatment, the patient has consented to  Procedure(s): ATRIAL FIBRILLATION ABLATION (N/A) as a surgical intervention.  The patient's history has been reviewed, patient examined, no change in status, stable for surgery.  I have reviewed the patient's chart and labs.  Questions were answered to the patient's satisfaction.    She reports compliance with eliquis without interruption.  Cardiac CT reviewed with her today. Risk, benefits, and alternatives to EP study and radiofrequency ablation for afib were also discussed in detail today. These risks include but are not limited to stroke, bleeding, vascular damage, tamponade, perforation, damage to the esophagus, lungs, and other structures, pulmonary vein stenosis, worsening renal function, and death. The patient understands these risk and wishes to proceed.     Thompson Grayer

## 2020-07-27 NOTE — Progress Notes (Signed)
On arrival to holding, CRNA noted 4 welts on patient: above rt eye, left upper forehead, rt lower jaw and rt upper chest; each approx size of a nickel  to a quarter. No swelling of lips nor tongue. No respiratory distress. Dr. Smith Robert in and ordered Benadryl which was given.

## 2020-07-27 NOTE — Transfer of Care (Signed)
Immediate Anesthesia Transfer of Care Note  Patient: Shannon Cummings  Procedure(s) Performed: ATRIAL FIBRILLATION ABLATION (N/A )  Patient Location: Cath Lab  Anesthesia Type:General  Level of Consciousness: awake, alert  and oriented  Airway & Oxygen Therapy: Patient Spontanous Breathing and Patient connected to nasal cannula oxygen  Post-op Assessment: Report given to RN, Post -op Vital signs reviewed and stable and Patient moving all extremities  Post vital signs: Reviewed and stable  Last Vitals:  Vitals Value Taken Time  BP 136/82 07/27/20 1025  Temp 36.5 C 07/27/20 1018  Pulse 65 07/27/20 1027  Resp 20 07/27/20 1027  SpO2 100 % 07/27/20 1027  Vitals shown include unvalidated device data.  Last Pain:  Vitals:   07/27/20 1018  TempSrc: Temporal  PainSc:          Complications: No complications documented.

## 2020-07-28 ENCOUNTER — Encounter (HOSPITAL_COMMUNITY): Payer: Self-pay | Admitting: Internal Medicine

## 2020-07-28 LAB — POCT ACTIVATED CLOTTING TIME: Activated Clotting Time: 630 seconds

## 2020-08-02 ENCOUNTER — Telehealth: Payer: Self-pay | Admitting: Pharmacist

## 2020-08-02 MED ORDER — PRALUENT 75 MG/ML ~~LOC~~ SOAJ: 75.0000 mg | SUBCUTANEOUS | 3 refills | Status: DC

## 2020-08-02 MED ORDER — NEXLETOL 180 MG PO TABS: 1.0000 | ORAL_TABLET | Freq: Every day | ORAL | 1 refills | Status: DC

## 2020-08-02 NOTE — Telephone Encounter (Signed)
Called pt and successfully reactivated [employer]. Sent updated ID to pt via MyChart message. Sent in refills on Praluent and Nexletol as well.

## 2020-08-03 ENCOUNTER — Ambulatory Visit (HOSPITAL_COMMUNITY)
Admission: RE | Admit: 2020-08-03 | Discharge: 2020-08-03 | Disposition: A | Discharge status: Home / Self Care | Payer: Medicare Other | Source: Ambulatory Visit | Attending: Nurse Practitioner | Admitting: Nurse Practitioner

## 2020-08-03 ENCOUNTER — Ambulatory Visit (HOSPITAL_BASED_OUTPATIENT_CLINIC_OR_DEPARTMENT_OTHER)
Admission: RE | Admit: 2020-08-03 | Discharge: 2020-08-03 | Disposition: A | Discharge status: Home / Self Care | Payer: Medicare Other | Source: Ambulatory Visit | Attending: Nurse Practitioner | Admitting: Nurse Practitioner

## 2020-08-03 ENCOUNTER — Other Ambulatory Visit: Payer: Self-pay

## 2020-08-03 ENCOUNTER — Telehealth: Payer: Self-pay | Admitting: Internal Medicine

## 2020-08-03 DIAGNOSIS — R1031 Right lower quadrant pain: Secondary | ICD-10-CM | POA: Insufficient documentation

## 2020-08-03 NOTE — Progress Notes (Signed)
Pt in for rt groin check. She had felt  discomfort in the area since the ablation 07/27/20, which has not improved, she become concerned when this am when she felt a  "knot" rt groin with the pain that has not improved. She describes this as a sharp sticking pain that she feels with sitting and walking. The knot is the closure device, that appears to be normal in size and there is some old residual bruising that is better since the procedure and is not worsening. No bruit noted and pulse in rt foot is normal,  but with persistent discomfort will order an echo of the rt groin.  Echo tech  called to inform me that u/s of the rt groin did not show a problem, normal groin. Pt reassured and d/c home.

## 2020-08-03 NOTE — Telephone Encounter (Signed)
New Message:     Pt said she had an Ablation on last Tuesday(07-27-20). She says she now have a big knot which seems to be getting bigger on her incision. She says it is very painful,feels like it is pinching something. She says she would like to be seen by somebody today please.

## 2020-08-03 NOTE — Telephone Encounter (Signed)
The patient states her pain has not gotten any worse since the procedure but has not gotten any better. Today when she woke up she tried to stretch and because of the pain it caused her to feel the area and it felt larger to her than yesterday, what she is describing as a "knot".  Patient states there is a lot of bruising as well. Appointment set up for today at 2:30 pm with Roderic Palau in the [facility].  The patient agreeable and verbalized understanding. Given parking code

## 2020-08-03 NOTE — Progress Notes (Signed)
Pseudo check has been completed.   Preliminary results in CV Proc.   Abram Sander 08/03/2020 2:54 PM

## 2020-08-13 ENCOUNTER — Telehealth: Payer: Self-pay | Admitting: Internal Medicine

## 2020-08-13 NOTE — Telephone Encounter (Signed)
Called pharmacy and gave them the [employer] card info. Went through for no cost. Called pt to let her know. She was appreciative of the help.

## 2020-08-13 NOTE — Telephone Encounter (Signed)
Pt c/o medication issue:  1. Name of Medication:  Alirocumab (PRALUENT) 75 MG/ML SOAJ Bempedoic Acid (NEXLETOL) 180 MG TABS  2. How are you currently taking this medication (dosage and times per day)? praluent inject every 14 days, nexletol 1 tablet daily  3. Are you having a reaction (difficulty breathing--STAT)? no  4. What is your medication issue? Patient states she hasn't been paying for the medication and the program she is in was supposed to be renewed. She states she was given a card for the medications, but it did not work.

## 2020-08-25 ENCOUNTER — Other Ambulatory Visit: Payer: Self-pay

## 2020-08-25 ENCOUNTER — Ambulatory Visit (HOSPITAL_COMMUNITY)
Admission: RE | Admit: 2020-08-25 | Discharge: 2020-08-25 | Disposition: A | Discharge status: Home / Self Care | Payer: Medicare Other | Source: Ambulatory Visit | Attending: Nurse Practitioner | Admitting: Nurse Practitioner

## 2020-08-25 ENCOUNTER — Encounter (HOSPITAL_COMMUNITY): Payer: Self-pay | Admitting: Nurse Practitioner

## 2020-08-25 VITALS — BP 124/80 | HR 64 | Ht 60.0 in | Wt 138.4 lb

## 2020-08-25 DIAGNOSIS — Z888 Allergy status to other drugs, medicaments and biological substances status: Secondary | ICD-10-CM | POA: Diagnosis not present

## 2020-08-25 DIAGNOSIS — Z955 Presence of coronary angioplasty implant and graft: Secondary | ICD-10-CM | POA: Insufficient documentation

## 2020-08-25 DIAGNOSIS — I10 Essential (primary) hypertension: Secondary | ICD-10-CM | POA: Insufficient documentation

## 2020-08-25 DIAGNOSIS — I2510 Atherosclerotic heart disease of native coronary artery without angina pectoris: Secondary | ICD-10-CM | POA: Diagnosis not present

## 2020-08-25 DIAGNOSIS — D6869 Other thrombophilia: Secondary | ICD-10-CM

## 2020-08-25 DIAGNOSIS — Z79899 Other long term (current) drug therapy: Secondary | ICD-10-CM | POA: Diagnosis not present

## 2020-08-25 DIAGNOSIS — Z7901 Long term (current) use of anticoagulants: Secondary | ICD-10-CM | POA: Diagnosis not present

## 2020-08-25 DIAGNOSIS — I480 Paroxysmal atrial fibrillation: Secondary | ICD-10-CM | POA: Insufficient documentation

## 2020-08-25 NOTE — Progress Notes (Signed)
Primary Care Physician: [name], MD Referring Physician: Dr. Helayne Seminole is a 75 y.o. female with a h/o CAD, HTN, paroxysmal afib, that is in the afib clinic for one month f/u afib ablation. She had rt  groin concerns at first, but this has resolved.  No swallowing issues. Has felt a few flutters but no sustained afib.   Today, she denies symptoms of palpitations, chest pain, shortness of breath, orthopnea, PND, lower extremity edema, dizziness, presyncope, syncope, or neurologic sequela. The patient is tolerating medications without difficulties and is otherwise without complaint today.   Past Medical History:  Diagnosis Date  . Arthritis   . CAD (coronary artery disease)    a. prior stenting history. b. NSTEMI s/p DES to prox LAD with EF 45% by cath 08/14/12. c. Mild trop elevation shortly after cath ?mild plaque embolization - patent stent on relook. // d. Myoview 8/18: EF 60, normal perfusion; Low Risk. e. NSTEMI 06/2018- patent prior LAD stent, 80% OM1 s/p DES, normal LVEDP, EF 45-50%, moderate residual disease treated medically.  . Fatty infiltration of liver   . GERD (gastroesophageal reflux disease)   . Hyperlipidemia   . Hypertension   . Hypokalemia   . Hypotension   . Myocardial infarction ([place]) 2014  . OSA on CPAP   . Osteoporosis   . PAF (paroxysmal atrial fibrillation) ([place])    On rythmol previously  . Pulmonary nodule    a. 55mm subpleural nodular density CT 08/2012, instructed to f/u pulm MD.  . Sinus bradycardia   . Tubular adenoma of colon 2007   Past Surgical History:  Procedure Laterality Date  . ATRIAL FIBRILLATION ABLATION N/A 07/27/2020   Procedure: ATRIAL FIBRILLATION ABLATION;  Surgeon: Thompson Grayer, MD;  Location: [place] [facility];  Service: Cardiovascular;  Laterality: N/A;  . CORONARY ANGIOPLASTY WITH STENT PLACEMENT  08/14/2012   LAD  Dr [name]  . CORONARY STENT INTERVENTION N/A 06/14/2018   Procedure: CORONARY STENT  INTERVENTION;  Surgeon: Jettie Booze, MD;  Location: [place] [facility];  Service: Cardiovascular;  Laterality: N/A;  . CORONARY STENT PLACEMENT  2000  . hair restoration  02/2017  . LEFT HEART CATH AND CORONARY ANGIOGRAPHY N/A 06/14/2018   Procedure: LEFT HEART CATH AND CORONARY ANGIOGRAPHY;  Surgeon: Jettie Booze, MD;  Location: [place] [facility];  Service: Cardiovascular;  Laterality: N/A;  . LEFT HEART CATH AND CORONARY ANGIOGRAPHY N/A 05/14/2019   Procedure: LEFT HEART CATH AND CORONARY ANGIOGRAPHY;  Surgeon: [name], MD;  Location: [place] [facility];  Service: Cardiovascular;  Laterality: N/A;  . LEFT HEART CATHETERIZATION WITH CORONARY ANGIOGRAM N/A 08/14/2012   Procedure: LEFT HEART CATHETERIZATION WITH CORONARY ANGIOGRAM;  Surgeon: [name], MD;  Location: [facility] CATH LAB;  Service: Cardiovascular;  Laterality: N/A;  . LEFT HEART CATHETERIZATION WITH CORONARY ANGIOGRAM N/A 08/19/2012   Procedure: LEFT HEART CATHETERIZATION WITH CORONARY ANGIOGRAM;  Surgeon: [name], MD;  Location: [facility] C[facility] LAB;  Service: Cardiovascular;  Laterality: N/A;  . ROTATOR CUFF REPAIR    . TONSILLECTOMY    . TOTAL ABDOMINAL HYSTERECTOMY      Current Outpatient Medications  Medication Sig Dispense Refill  . acetaminophen (TYLENOL) 650 MG CR tablet Take 650 mg by mouth every 8 (eight) hours as needed for pain.    . Alirocumab (PRALUENT) 75 MG/ML SOAJ Inject 75 mg into the skin every 14 (fourteen) days. 6 mL 3  . amLODipine (NORVASC) 2.5 MG tablet  Take 1 tablet (2.5 mg total) by mouth daily. 30 tablet 11  . Bempedoic Acid (NEXLETOL) 180 MG TABS Take 1 tablet by mouth daily. 90 tablet 1  . Cholecalciferol (VITAMIN D3) 50 MCG (2000 UT) capsule Take 2,000 Units by mouth daily.    Marland Kitchen dicyclomine (BENTYL) 20 MG tablet TID prn abdominal pain/spasm 60 tablet 0  . ELIQUIS 5 MG TABS tablet TAKE 1 TABLET(5 MG) BY MOUTH TWICE DAILY 180 tablet 1  . fluticasone (FLONASE) 50 MCG/ACT nasal spray  Place 2 sprays into both nostrils daily as needed for allergies or rhinitis. 48 g 1  . losartan (COZAAR) 25 MG tablet TAKE 2 TABLETS(50 MG) BY MOUTH DAILY 180 tablet 3  . nitroGLYCERIN (NITROSTAT) 0.4 MG SL tablet DISSOLVE ONE TABLET UNDER TONGUE AS NEEDED FOR CHEST PAIN FOR UP TO 3 DOSES 25 tablet 3  . pantoprazole (PROTONIX) 40 MG tablet TAKE 1 TABLET(40 MG) BY MOUTH DAILY 90 tablet 1  . Polyethyl Glycol-Propyl Glycol (SYSTANE) 0.4-0.3 % SOLN Place 1 drop into both eyes 3 (three) times daily.    . potassium chloride (KLOR-CON) 10 MEQ tablet TAKE 1 TABLET(10 MEQ) BY MOUTH DAILY 90 tablet 3  . propranolol (INDERAL) 10 MG tablet Take 10 mg by mouth 4 (four) times daily as needed (for A Fib Attacks).    . saccharomyces boulardii (FLORASTOR) 250 MG capsule Take 1 capsule (250 mg total) by mouth 2 (two) times daily. 90 capsule 0  . spironolactone (ALDACTONE) 25 MG tablet TAKE 1/2 TABLET(12.5 MG) BY MOUTH DAILY 90 tablet 1   No current facility-administered medications for this encounter.    Allergies  Allergen Reactions  . Effexor [Venlafaxine] Other (See Comments)    CAUSED TOO MUCH SEDATION; CANNOT TOLERATE  . Flomax [Tamsulosin] Other (See Comments)    Caused rapid HYPOTENSION- patient came very close to fainting (also caused weakness and vertigo)  . Isosorbide     Headache  . Macrobid [Nitrofurantoin] Diarrhea, Nausea Only and Other (See Comments)    Severe abd pain   . Other Anxiety and Other (See Comments)    Intolerance to strong pain medications=  Make her feel anxious and feel like coming out of her skin  . Statins Other (See Comments)    Restless legs, bad feeling.  This has occurred with Crestor 5 mg once weekly, Lipitor 10 mg twice weekly, and Simvastatin daily  . Zetia [Ezetimibe] Other (See Comments)    Made the legs ache    Social History   Socioeconomic History  . Marital status: Married    Spouse name: Not on file  . Number of children: 2  . Years of education: Not  on file  . Highest education level: Not on file  Occupational History  . 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 Smoking status: Never Smoker  . Smokeless tobacco: Never Used  Vaping Use  . Vaping Use: Never used  Substance and Sexual Activity  . Alcohol use: Yes    Alcohol/week: 1.0 standard drink    Types: 1 Glasses of wine per week    Comment: 2 drinks daily   . Drug use: No  . Sexual activity: Not Currently  Other Topics Concern  . Not on file  Social History Narrative   Daily caffeine    Lives in [place]   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[employer]  Financial Resource Strain: Low Risk   . Difficulty of Paying Living Expenses: Not hard at all  Food Insecurity: No Food Insecurity  . Worried About Charity fundraiser in the Last Year: Never true  . Ran Out of Food in the Last Year: Never true  Transportation Needs: No Transportation Needs  . Lack of Transportation (Medical): No  . Lack of Transportation (Non-Medical): No  Physical Activity: Sufficiently Active  . Days of Exercise per Week: 4 days  . Minutes of Exercise per Session: 60 min  Stress: No Stress Concern Present  . Feeling of Stress : Not at all  Social Connections: Moderately Isolated  . Frequency of Communication with Friends and Family: More than three times a week  . Frequency of Social Gatherings with Friends and Family: More than three times a week  . Attends Religious Services: Never  . Active Member of Clubs or Organizations: No  . Attends Archivist Meetings: Never  . Marital Status: Married  Human resources officer Violence: Not At Risk  . Fear of Current or Ex-Partner: No  . Emotionally Abused: No  . Physically Abused: No  . Sexually Abused: No    Family History  Problem Relation Age of Onset  . Heart disease Father   . Alzheimer's disease Mother   . Breast cancer Maternal Aunt   .  Colon cancer Neg Hx     ROS- All systems are reviewed and negative except as per the HPI above  Physical Exam: Vitals:   08/25/20 1148  BP: 124/80  Pulse: 64  Weight: 62.8 kg  Height: 5' (1.524 m)   Wt Readings from Last 3 Encounters:  08/25/20 62.8 kg  07/27/20 62.6 kg  06/30/20 63.6 kg    Labs: Lab Results  Component Value Date   NA 134 06/30/2020   K 4.8 06/30/2020   CL 95 (L) 06/30/2020   CO2 23 06/30/2020   GLUCOSE 91 06/30/2020   BUN 13 06/30/2020   CREATININE 0.81 06/30/2020   CALCIUM 9.6 06/30/2020   MG 2.0 08/17/2012   Lab Results  Component Value Date   INR 1.13 06/13/2018   Lab Results  Component Value Date   CHOL 151 04/26/2020   HDL 46 04/26/2020   LDLCALC 77 04/26/2020   TRIG 163 (H) 04/26/2020     GEN- The patient is well appearing, alert and oriented x 3 today.   Head- normocephalic, atraumatic Eyes-  Sclera clear, conjunctiva pink Ears- hearing intact Oropharynx- clear Neck- supple, no JVP Lymph- no cervical lymphadenopathy Lungs- Clear to ausculation bilaterally, normal work of breathing Heart- Regular rate and rhythm, no murmurs, rubs or gallops, PMI not laterally displaced GI- soft, NT, ND, + BS Extremities- no clubbing, cyanosis, or edema MS- no significant deformity or atrophy Skin- no rash or lesion Psych- euthymic mood, full affect Neuro- strength and sensation are intact  EKG-NSR at 64 bpm, pr int 200 ms, qrs int 82 ms, qtc 435 ms     Assessment and Plan: 1. afib S/p ablation  In SR today  Continue propanolol prn as needed for  afib  2. CHA2DS2VASc score of 4 Continue eliquis 5 mg bid   3. HTN Stable   4. CAD No anginal symptoms   F/u Dr. Rayann Heman 10/27/20  Geroge Baseman. Lorena Clearman, [facility] [address] [place], [place] [address] [phone]

## 2020-08-27 DIAGNOSIS — B351 Tinea unguium: Secondary | ICD-10-CM | POA: Diagnosis not present

## 2020-08-27 DIAGNOSIS — L820 Inflamed seborrheic keratosis: Secondary | ICD-10-CM | POA: Diagnosis not present

## 2020-09-16 ENCOUNTER — Other Ambulatory Visit: Payer: Self-pay | Admitting: Internal Medicine

## 2020-09-16 NOTE — Telephone Encounter (Signed)
Prescription refill request for Eliquis received. Indication:afib Last office visit:08/25/20 Scr:0.81 (06/30/20) Age:75  Weight:62kg

## 2020-10-06 ENCOUNTER — Telehealth: Payer: Self-pay | Admitting: *Deleted

## 2020-10-06 NOTE — Telephone Encounter (Signed)
Completed telephone call/medical record review for visit T21,M57 for the CLEAR research trial. Subject is currently only telephone calls only and does not receive any IP. All concomitant medications have been reviewed and updated if applicable. There are no new AE's or SAE's to report to sponsor at this time. Patient will remain telephone call only for the end of study call which will take place in June 2022.

## 2020-10-19 DIAGNOSIS — H16223 Keratoconjunctivitis sicca, not specified as Sjogren's, bilateral: Secondary | ICD-10-CM | POA: Diagnosis not present

## 2020-10-25 ENCOUNTER — Other Ambulatory Visit: Payer: Self-pay

## 2020-10-25 ENCOUNTER — Ambulatory Visit (INDEPENDENT_AMBULATORY_CARE_PROVIDER_SITE_OTHER): Payer: Medicare Other | Admitting: Family Medicine

## 2020-10-25 ENCOUNTER — Encounter: Payer: Self-pay | Admitting: Family Medicine

## 2020-10-25 VITALS — BP 116/80 | HR 60 | Temp 96.8°F | Resp 16 | Ht 61.0 in | Wt 138.2 lb

## 2020-10-25 DIAGNOSIS — R140 Abdominal distension (gaseous): Secondary | ICD-10-CM | POA: Diagnosis not present

## 2020-10-25 DIAGNOSIS — K5900 Constipation, unspecified: Secondary | ICD-10-CM

## 2020-10-25 DIAGNOSIS — I25119 Atherosclerotic heart disease of native coronary artery with unspecified angina pectoris: Secondary | ICD-10-CM | POA: Diagnosis not present

## 2020-10-25 DIAGNOSIS — Z1211 Encounter for screening for malignant neoplasm of colon: Secondary | ICD-10-CM | POA: Diagnosis not present

## 2020-10-25 DIAGNOSIS — M545 Low back pain, unspecified: Secondary | ICD-10-CM

## 2020-10-25 MED ORDER — TIZANIDINE HCL 4 MG PO TABS: 4.0000 mg | ORAL_TABLET | Freq: Three times a day (TID) | ORAL | 0 refills | Status: DC | PRN

## 2020-10-25 NOTE — Progress Notes (Signed)
   Subjective:    Patient ID: Shannon Cummings, female    DOB: 1946/03/24, 74 y.o.   MRN: [id number]  HPI Back pain- pt reports lower back pain.  'occurs every so often'.  Sxs started ~1 week ago.  States things are worst first things in the morning and she has to 'roll out of bed'.  Has had 2 massages w/ mild relief.  Abdominal bloating- pt reports she will feel bloated and distended, particularly after eating.  No nausea.  No cramping.  Pt has had normal BMs for the last few days but prior to that was having hard pellets.  Bloating has mildly improved w/ improved BMs.  Does have some RLQ pain that will wax and wane.  Pt reports she is 2 yrs past due for colonoscopy [facility] Plan).   Review of Systems For ROS see HPI   This visit occurred during the SARS-CoV-2 public health emergency.  Safety protocols were in place, including screening questions prior to the visit, additional usage of staff PPE, and extensive cleaning of exam room while observing appropriate contact time as indicated for disinfecting solutions.       Objective:   Physical Exam Vitals reviewed.  Constitutional:      General: She is not in acute distress.    Appearance: Normal appearance. She is not ill-appearing.  HENT:     Head: Normocephalic and atraumatic.  Abdominal:     General: Abdomen is flat. There is no distension.     Palpations: Abdomen is soft.     Tenderness: There is no abdominal tenderness. There is no guarding or rebound.  Musculoskeletal:        General: Tenderness (TTP over lumbar paraspinal muscles bilaterally) present. No deformity.  Skin:    General: Skin is warm and dry.  Neurological:     General: No focal deficit present.     Mental Status: She is alert and oriented to person, place, and time.  Psychiatric:        Mood and Affect: Mood normal.        Behavior: Behavior normal.        Thought Content: Thought content normal.           Assessment & Plan:  Back pain- new.  Pt w/ TTP over  bilateral lumbar paraspinal muscles and there is palpable spasm.  Agree w/ massage.  Start muscle relaxers to improve sxs.  Reviewed supportive care and red flags that should prompt return.  Pt expressed understanding and is in agreement w/ plan.   Abd bloating- new.  Pt w/ abd pain and bloating after eating.  Suspect this is due to constipation as the pain that she has is where the colon turns sharply.  Will start Miralax as needed w/ goal of regular BMs (not pellets).  She is overdue for colonoscopy so will refer back to GI.  Reviewed supportive care and red flags that should prompt return.  Pt expressed understanding and is in agreement w/ plan.

## 2020-10-25 NOTE — Patient Instructions (Addendum)
Follow up as needed or as scheduled START the Tizanidine as needed for spasm/back pain Use a heating pad as needed Make sure you are drinking water throughout the day to help soften the stools Consider a probiotic to keep things moving Miralax as needed for regular bowel movements- you can play with this as appropriate Call with any questions or concerns Stay Safe!  Stay Healthy!

## 2020-10-27 ENCOUNTER — Ambulatory Visit: Payer: Medicare Other | Admitting: Internal Medicine

## 2020-10-29 ENCOUNTER — Emergency Department (HOSPITAL_BASED_OUTPATIENT_CLINIC_OR_DEPARTMENT_OTHER)
Admission: EM | Admit: 2020-10-29 | Discharge: 2020-10-30 | Disposition: A | Discharge status: Home / Self Care | Payer: Medicare Other | Attending: Emergency Medicine | Admitting: Emergency Medicine

## 2020-10-29 ENCOUNTER — Telehealth: Payer: Self-pay

## 2020-10-29 ENCOUNTER — Other Ambulatory Visit: Payer: Self-pay

## 2020-10-29 ENCOUNTER — Emergency Department (HOSPITAL_BASED_OUTPATIENT_CLINIC_OR_DEPARTMENT_OTHER): Payer: Medicare Other

## 2020-10-29 ENCOUNTER — Encounter (HOSPITAL_BASED_OUTPATIENT_CLINIC_OR_DEPARTMENT_OTHER): Payer: Self-pay | Admitting: *Deleted

## 2020-10-29 DIAGNOSIS — R1031 Right lower quadrant pain: Secondary | ICD-10-CM | POA: Insufficient documentation

## 2020-10-29 DIAGNOSIS — Z79899 Other long term (current) drug therapy: Secondary | ICD-10-CM | POA: Insufficient documentation

## 2020-10-29 DIAGNOSIS — Z7901 Long term (current) use of anticoagulants: Secondary | ICD-10-CM | POA: Insufficient documentation

## 2020-10-29 DIAGNOSIS — R110 Nausea: Secondary | ICD-10-CM | POA: Diagnosis not present

## 2020-10-29 DIAGNOSIS — Z955 Presence of coronary angioplasty implant and graft: Secondary | ICD-10-CM | POA: Insufficient documentation

## 2020-10-29 DIAGNOSIS — I10 Essential (primary) hypertension: Secondary | ICD-10-CM | POA: Insufficient documentation

## 2020-10-29 DIAGNOSIS — I2510 Atherosclerotic heart disease of native coronary artery without angina pectoris: Secondary | ICD-10-CM | POA: Insufficient documentation

## 2020-10-29 DIAGNOSIS — K5900 Constipation, unspecified: Secondary | ICD-10-CM | POA: Diagnosis not present

## 2020-10-29 DIAGNOSIS — Z8616 Personal history of COVID-19: Secondary | ICD-10-CM | POA: Diagnosis not present

## 2020-10-29 DIAGNOSIS — M549 Dorsalgia, unspecified: Secondary | ICD-10-CM | POA: Diagnosis not present

## 2020-10-29 LAB — COMPREHENSIVE METABOLIC PANEL
ALT: 17 U/L (ref 0–44)
AST: 23 U/L (ref 15–41)
Albumin: 4.7 g/dL (ref 3.5–5.0)
Alkaline Phosphatase: 63 U/L (ref 38–126)
Anion gap: 10 (ref 5–15)
BUN: 18 mg/dL (ref 8–23)
CO2: 28 mmol/L (ref 22–32)
Calcium: 9.1 mg/dL (ref 8.9–10.3)
Chloride: 97 mmol/L — ABNORMAL LOW (ref 98–111)
Creatinine, Ser: 0.83 mg/dL (ref 0.44–1.00)
GFR, Estimated: >60 mL/min (ref >60)
Glucose, Bld: 85 mg/dL (ref 70–99)
Potassium: 3.8 mmol/L (ref 3.5–5.1)
Sodium: 135 mmol/L (ref 135–145)
Total Bilirubin: 0.6 mg/dL (ref 0.3–1.2)
Total Protein: 7.5 g/dL (ref 6.5–8.1)

## 2020-10-29 LAB — CBC
HCT: 42.5 % (ref 36.0–46.0)
Hemoglobin: 14.1 g/dL (ref 12.0–15.0)
MCH: 29.9 pg (ref 26.0–34.0)
MCHC: 33.2 g/dL (ref 30.0–36.0)
MCV: 90 fL (ref 80.0–100.0)
Platelets: 225 10*3/uL (ref 150–400)
RBC: 4.72 MIL/uL (ref 3.87–5.11)
RDW: 12.8 % (ref 11.5–15.5)
WBC: 6.7 10*3/uL (ref 4.0–10.5)
nRBC: 0 % (ref 0.0–0.2)

## 2020-10-29 LAB — URINALYSIS, ROUTINE W REFLEX MICROSCOPIC
Bilirubin Urine: NEGATIVE
Glucose, UA: NEGATIVE mg/dL
Hgb urine dipstick: NEGATIVE
Ketones, ur: NEGATIVE mg/dL
Leukocytes,Ua: NEGATIVE
Nitrite: NEGATIVE
Protein, ur: NEGATIVE mg/dL
Specific Gravity, Urine: 1.011 (ref 1.005–1.030)
pH: 7 (ref 5.0–8.0)

## 2020-10-29 LAB — LIPASE, BLOOD: Lipase: 35 U/L (ref 11–51)

## 2020-10-29 MED ORDER — MORPHINE SULFATE (PF) 4 MG/ML IV SOLN
4.0000 mg | Freq: Once | INTRAVENOUS | Status: AC
  Administered 2020-10-29: 4 mg via INTRAVENOUS
  Filled 2020-10-29: qty 1

## 2020-10-29 MED ORDER — SODIUM CHLORIDE 0.9 % IV BOLUS
1000.0000 mL | Freq: Once | INTRAVENOUS | Status: AC
  Administered 2020-10-29: 1000 mL via INTRAVENOUS

## 2020-10-29 MED ORDER — ONDANSETRON HCL 4 MG/2ML IJ SOLN
4.0000 mg | Freq: Once | INTRAMUSCULAR | Status: AC
  Administered 2020-10-29: 4 mg via INTRAVENOUS
  Filled 2020-10-29: qty 2

## 2020-10-29 NOTE — Telephone Encounter (Signed)
Called patient for more details. She states the pain is located in her right lower quadrant of her abdomen that radiates to the back. Pain is constant since this morning. Patient reports pain as throbbing. Stomach pain more intense today. No fever, no blood in stools. Pain less intense when at rest.

## 2020-10-29 NOTE — Telephone Encounter (Signed)
R lower quadrant pain in concerning for possible appendicitis- especially since it has localized to that particular area, is now constant and more severe.  If it hurts to push on that area, if it hurts to jump up and down those would also be concerning signs and I would want her to go to UC ASAP or see if there are any available openings in [place] for this afternoon

## 2020-10-29 NOTE — Telephone Encounter (Signed)
Encounter opened in error. Msg sent to pcp

## 2020-10-29 NOTE — Telephone Encounter (Signed)
Spoke with patient and patient stated that her stomach is still in pain following her visit from Monday of this week. I informed her as well as triage that you did not have any openings today and that if the pain got any worse then go to the ER. Patient understood.

## 2020-10-29 NOTE — ED Triage Notes (Signed)
Pt states she has been having abd pain more in rLQ for over 2 weeks, saw PCP Monday, who thought it was constipation, has been taking stool softeners, given muscle relaxer for back. Pt states she feels bloated, with some chills this afternoon.

## 2020-10-29 NOTE — Telephone Encounter (Signed)
Is it her stomach pain or back pain?  When she was here, the stomach was more distended/bloated than painful.  So I need more information about the pain she is experiencing

## 2020-10-29 NOTE — ED Provider Notes (Signed)
[facility] EMERGENCY DEPT Provider Note   CSN: [id number] Arrival date & time: 10/29/20  1606     History Chief Complaint  Patient presents with  . Abdominal Pain    Shannon Cummings is a 75 y.o. female.  75 yo F with a chief complaints of right-sided abdominal pain.  Going on for a couple weeks.  Pain seems to have gotten progressively worse.  Worse over the past 24 to 48 hours.  Right-sided has been having pain in her back as well.  Has had some nausea.  Denies fevers or chills denies urinary symptoms.  Denies prior abdominal surgery.  The history is provided by the patient.  Abdominal Pain Pain location:  RLQ Pain quality: aching, cramping and sharp   Pain radiates to:  Back Pain severity:  Moderate Onset quality:  Gradual Duration:  2 weeks Timing:  Constant Progression:  Worsening Chronicity:  New Relieved by:  Nothing Worsened by:  Nothing Ineffective treatments:  None tried Associated symptoms: no chest pain, no chills, no dysuria, no fever, no nausea, no shortness of breath and no vomiting        Past Medical History:  Diagnosis Date  . Arthritis   . CAD (coronary artery disease)    a. prior stenting history. b. NSTEMI s/p DES to prox LAD with EF 45% by cath 08/14/12. c. Mild trop elevation shortly after cath ?mild plaque embolization - patent stent on relook. // d. Myoview 8/18: EF 60, normal perfusion; Low Risk. e. NSTEMI 06/2018- patent prior LAD stent, 80% OM1 s/p DES, normal LVEDP, EF 45-50%, moderate residual disease treated medically.  . Fatty infiltration of liver   . GERD (gastroesophageal reflux disease)   . Hyperlipidemia   . Hypertension   . Hypokalemia   . Hypotension   . Myocardial infarction ([place]) 2014  . OSA on CPAP   . Osteoporosis   . PAF (paroxysmal atrial fibrillation) ([place])    On rythmol previously  . Pulmonary nodule    a. 51mm subpleural nodular density CT 08/2012, instructed to f/u pulm MD.  . Sinus bradycardia   .  Tubular adenoma of colon 2007    Patient Active Problem List   Diagnosis Date Noted  . H/O Clostridium difficile infection 05/20/2020  . Diarrhea of infectious origin 04/09/2020  . Abdominal pain 04/09/2020  . Nausea 04/09/2020  . Enteritis due to Clostridium difficile 04/09/2020  . COVID-19 05/13/2019  . Unstable angina ([place]) 05/13/2019  . Sinus bradycardia 06/15/2018  . Ischemic cardiomyopathy 06/15/2018  . NSTEMI (non-ST elevated myocardial infarction) ([place]) 06/13/2018  . GERD (gastroesophageal reflux disease) 07/10/2016  . Hx of colonic polyp 02/25/2016  . Antiplatelet or antithrombotic long-term use 02/25/2016  . Nodule of right lung 10/13/2012  . History of non-ST elevation myocardial infarction (NSTEMI) 08/14/2012  . Hepatic steatosis 12/03/2011  . Paroxysmal atrial fibrillation ([place]) 12/22/2010  . CAD (coronary artery disease) 12/22/2010  . INSOMNIA 04/13/2010  . Hyperlipidemia 03/19/2010  . Essential hypertension 03/19/2010  . Obstructive sleep apnea 03/17/2010    Past Surgical History:  Procedure Laterality Date  . ATRIAL FIBRILLATION ABLATION N/A 07/27/2020   Procedure: ATRIAL FIBRILLATION ABLATION;  Surgeon: Thompson Grayer, MD;  Location: [facility];  Service: Cardiovascular;  Laterality: N/A;  . CORONARY ANGIOPLASTY WITH STENT PLACEMENT  08/14/2012   LAD  Dr [name]  . CORONARY STENT INTERVENTION N/A 06/14/2018   Procedure: CORONARY STENT INTERVENTION;  Surgeon: Jettie Booze, MD;  Location: [facility];  Service: Cardiovascular;  Laterality: N/A;  . CORONARY STENT PLACEMENT  2000  . hair restoration  02/2017  . LEFT HEART CATH AND CORONARY ANGIOGRAPHY N/A 06/14/2018   Procedure: LEFT HEART CATH AND CORONARY ANGIOGRAPHY;  Surgeon: Jettie Booze, MD;  Location: [facility];  Service: Cardiovascular;  Laterality: N/A;  . LEFT HEART CATH AND CORONARY ANGIOGRAPHY N/A 05/14/2019   Procedure: LEFT HEART CATH AND CORONARY  ANGIOGRAPHY;  Surgeon: [name], MD;  Location: [facility];  Service: Cardiovascular;  Laterality: N/A;  . LEFT HEART CATHETERIZATION WITH CORONARY ANGIOGRAM N/A 08/14/2012   Procedure: LEFT HEART CATHETERIZATION WITH CORONARY ANGIOGRAM;  Surgeon: [name], MD;  Location: [facility] CATH LAB;  Service: Cardiovascular;  Laterality: N/A;  . LEFT HEART CATHETERIZATION WITH CORONARY ANGIOGRAM N/A 08/19/2012   Procedure: LEFT HEART CATHETERIZATION WITH CORONARY ANGIOGRAM;  Surgeon: [name], MD;  Location: [facility] CATH LAB;  Service: Cardiovascular;  Laterality: N/A;  . ROTATOR CUFF REPAIR    . TONSILLECTOMY    . TOTAL ABDOMINAL HYSTERECTOMY       OB History   No obstetric history on file.     Family History  Problem Relation Age of Onset  . Heart disease Father   . Alzheimer's disease Mother   . Breast cancer Maternal Aunt   . Colon cancer Neg Hx     Social History   Tobacco Use  . Smoking status: Never Smoker  . Smokeless tobacco: Never Used  Vaping Use  . Vaping Use: Never used  Substance Use Topics  . Alcohol use: Yes    Alcohol/week: 1.0 standard drink    Types: 1 Glasses of wine per week    Comment: 2 drinks daily   . Drug use: No    Home Medications Prior to Admission medications   Medication Sig Start Date End Date Taking? Authorizing Provider  acetaminophen (TYLENOL) 650 MG CR tablet Take 650 mg by mouth every 8 (eight) hours as needed for pain.   Yes [provider]  Alirocumab (PRALUENT) 75 MG/ML SOAJ Inject 75 mg into the skin every 14 (fourteen) days. 08/02/20  Yes Nahser, Wonda Cheng, MD  Bempedoic Acid (NEXLETOL) 180 MG TABS Take 1 tablet by mouth daily. 08/02/20  Yes Nahser, Wonda Cheng, MD  Cholecalciferol (VITAMIN D3) 50 MCG (2000 UT) capsule Take 2,000 Units by mouth daily.   Yes [provider]  ELIQUIS 5 MG TABS tablet TAKE 1 TABLET(5 MG) BY MOUTH TWICE DAILY 09/16/20  Yes Nahser, Wonda Cheng, MD  losartan (COZAAR) 25 MG tablet TAKE 2  TABLETS(50 MG) BY MOUTH DAILY 06/03/20  Yes Nahser, Wonda Cheng, MD  pantoprazole (PROTONIX) 40 MG tablet TAKE 1 TABLET(40 MG) BY MOUTH DAILY 06/18/20  Yes [name], MD  Polyethyl Glycol-Propyl Glycol (SYSTANE) 0.4-0.3 % SOLN Place 1 drop into both eyes 3 (three) times daily.   Yes [provider]  potassium chloride (KLOR-CON) 10 MEQ tablet TAKE 1 TABLET(10 MEQ) BY MOUTH DAILY 06/11/20  Yes Nahser, Wonda Cheng, MD  saccharomyces boulardii (FLORASTOR) 250 MG capsule Take 1 capsule (250 mg total) by mouth 2 (two) times daily. 04/08/20  Yes [name], MD  spironolactone (ALDACTONE) 25 MG tablet TAKE 1/2 TABLET(12.5 MG) BY MOUTH DAILY 03/22/20  Yes Nahser, Wonda Cheng, MD  tiZANidine (ZANAFLEX) 4 MG tablet Take 1 tablet (4 mg total) by mouth every 8 (eight) hours as needed for muscle spasms. 10/25/20  Yes [name], MD  amLODipine (NORVASC) 2.5 MG tablet  Take 1 tablet (2.5 mg total) by mouth daily. 05/14/19 05/13/20  Kroeger, Lorelee Cover., PA-C  dicyclomine (BENTYL) 20 MG tablet TID prn abdominal pain/spasm 04/08/20   [name], MD  fluticasone [facility]) 50 MCG/ACT nasal spray Place 2 sprays into both nostrils daily as needed for allergies or rhinitis. 01/08/20   [name], MD  nitroGLYCERIN (NITROSTAT) 0.4 MG SL tablet DISSOLVE ONE TABLET UNDER TONGUE AS NEEDED FOR CHEST PAIN FOR UP TO 3 DOSES 05/31/20   Kroeger, Lorelee Cover., PA-C  propranolol (INDERAL) 10 MG tablet Take 10 mg by mouth 4 (four) times daily as needed (for A Fib Attacks).    [provider]    Allergies    Effexor [venlafaxine], Flomax [tamsulosin], Isosorbide, Macrobid [nitrofurantoin], Other, Statins, and Zetia [ezetimibe]  Review of Systems   Review of Systems  Constitutional: Negative for chills and fever.  HENT: Negative for congestion and rhinorrhea.   Eyes: Negative for redness and visual disturbance.  Respiratory: Negative for shortness of breath and wheezing.   Cardiovascular:  Negative for chest pain and palpitations.  Gastrointestinal: Positive for abdominal pain. Negative for nausea and vomiting.  Genitourinary: Negative for dysuria and urgency.  Musculoskeletal: Negative for arthralgias and myalgias.  Skin: Negative for pallor and wound.  Neurological: Negative for dizziness and headaches.    Physical Exam Updated Vital Signs BP (!) 145/85 (BP Location: Right Arm)   Pulse (!) 58   Temp 97.8 F (36.6 C) (Oral)   Resp 16   Ht 5' 0.5" (1.537 m)   Wt 63 kg   SpO2 100%   BMI 26.70 kg/m   Physical Exam Vitals and nursing note reviewed.  Constitutional:      General: She is not in acute distress.    Appearance: She is well-developed. She is not diaphoretic.  HENT:     Head: Normocephalic and atraumatic.  Eyes:     Pupils: Pupils are equal, round, and reactive to light.  Cardiovascular:     Rate and Rhythm: Normal rate and regular rhythm.     Heart sounds: No murmur heard. No friction rub. No gallop.   Pulmonary:     Effort: Pulmonary effort is normal.     Breath sounds: No wheezing or rales.  Abdominal:     General: There is no distension.     Palpations: Abdomen is soft.     Tenderness: There is abdominal tenderness in the right lower quadrant.     Comments: Pain worst to the RLQ, mild guarding  Musculoskeletal:        General: No tenderness.     Cervical back: Normal range of motion and neck supple.  Skin:    General: Skin is warm and dry.  Neurological:     Mental Status: She is alert and oriented to person, place, and time.  Psychiatric:        Behavior: Behavior normal.     ED Results / Procedures / Treatments   Labs (all labs ordered are listed, but only abnormal results are displayed) Labs Reviewed  COMPREHENSIVE METABOLIC PANEL - Abnormal; Notable for the following components:      Result Value   Chloride 97 (*)    All other components within normal limits  URINALYSIS, ROUTINE W REFLEX MICROSCOPIC - Abnormal; Notable for  the following components:   Color, Urine COLORLESS (*)    All other components within normal limits  LIPASE, BLOOD  CBC    EKG None  Radiology No results found.  Procedures Procedures   Medications Ordered in ED Medications  morphine 4 MG/ML injection 4 mg (4 mg Intravenous Given 10/29/20 2334)  ondansetron (ZOFRAN) injection 4 mg (4 mg Intravenous Given 10/29/20 2334)  sodium chloride 0.9 % bolus 1,000 mL (1,000 mLs Intravenous New Bag/Given 10/29/20 2334)    ED Course  I have reviewed the triage vital signs and the nursing notes.  Pertinent labs & imaging results that were available during my care of the patient were reviewed by me and considered in my medical decision making (see chart for details).    MDM Rules/Calculators/A&P                          75 yo F with a chief complaints of right lower quadrant abdominal pain.  Going on for couple weeks.  Worsening over the past 24 to 48 hours.  She does have focal tenderness to the right lower quadrant.  Will obtain a CT scan.  Blood work without significant anemia no leukocytosis no significant electrolyte abnormality.  UA is negative for infection.  LFTs and lipase are unremarkable.  CT scan without obvious intra-abdominal pathology.  Patient has been being treated for possible constipation we will have her do a trial of MiraLAX.  PCP follow-up.  12:54 AM:  I have discussed the diagnosis/risks/treatment options with the patient and believe the pt to be eligible for discharge home to follow-up with PCP. We also discussed returning to the ED immediately if new or worsening sx occur. We discussed the sx which are most concerning (e.g., sudden worsening pain, fever, inability to tolerate by mouth) that necessitate immediate return. Medications administered to the patient during their visit and any new prescriptions provided to the patient are listed below.  Medications given during this visit Medications  morphine 4 MG/ML injection  4 mg (4 mg Intravenous Given 10/29/20 2334)  ondansetron (ZOFRAN) injection 4 mg (4 mg Intravenous Given 10/29/20 2334)  sodium chloride 0.9 % bolus 1,000 mL (0 mLs Intravenous Stopped 10/30/20 0029)     The patient appears reasonably screen and/or stabilized for discharge and I doubt any other medical condition or other [facility] requiring further screening, evaluation, or treatment in the ED at this time prior to discharge.   Final Clinical Impression(s) / ED Diagnoses Final diagnoses:  None    Rx / DC Orders ED Discharge Orders    None       Deno Etienne, DO 10/30/20 4034

## 2020-10-29 NOTE — Telephone Encounter (Signed)
Called patient with pcp recommendations. She voiced understanding. She states that if she decides to go anywhere, it will probably by the new [facility] location.

## 2020-10-29 NOTE — ED Notes (Signed)
BLUE top and Urine culture sent to lab

## 2020-10-30 NOTE — ED Notes (Signed)
Pt verbalizes understanding of discharge instructions. Opportunity for questioning and answers were provided. Armand removed by staff, pt discharged from ED to home. Instructed to come back if anything is worse.

## 2020-10-30 NOTE — Discharge Instructions (Signed)
Take 8 scoops of miralax in 32oz of whatever you would like to drink.(Gatorade comes in this size) You can also use a fleets enema which you can buy over the counter at the pharmacy.  Return for worsening abdominal pain, vomiting or fever. ? ?

## 2020-11-03 ENCOUNTER — Telehealth: Payer: Self-pay | Admitting: *Deleted

## 2020-11-03 NOTE — Telephone Encounter (Signed)
Just completed the end of study telephone call/medical chart review for the CLEAR research trial. Subject has not been on IP for quite some time. All concomitant medications were reviewed and updated in the [facility] if applicable. AE for "right lower quadrant abdominal pain" was reported to [facility] but no new SAE's to report at this time. The subject was reminded that they'll receive their post end of study telephone call in approximately 30 days from today.

## 2020-11-04 DIAGNOSIS — Z23 Encounter for immunization: Secondary | ICD-10-CM | POA: Diagnosis not present

## 2020-11-04 IMAGING — CT CT ANGIO CHEST
2 of 6 series · 19 of 46 positions shown · IV contrast (omnipaque)
Comparison: 01/05/2017

CLINICAL DATA: Complex chest pain

EXAM:
CT ANGIOGRAPHY CHEST WITH CONTRAST
TECHNIQUE: Multidetector CT imaging of the chest was performed using the
standard protocol during bolus administration of intravenous
contrast. Multiplanar CT image reconstructions and MIPs were
obtained to evaluate the vascular anatomy.
CONTRAST:  60mL OMNIPAQUE IOHEXOL 350 MG/ML SOLN

[Series 7: thins · axial · 0.65mm/px · z∈[+1057,+1294]mm · 16 of 261 slices shown]
[im 12/261  lung]
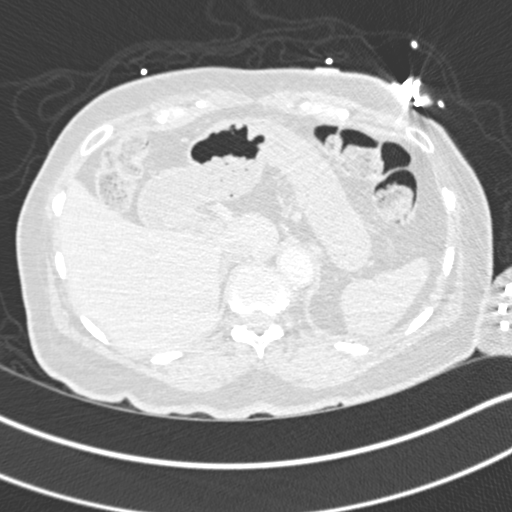
[im 34/261  soft-tissue]
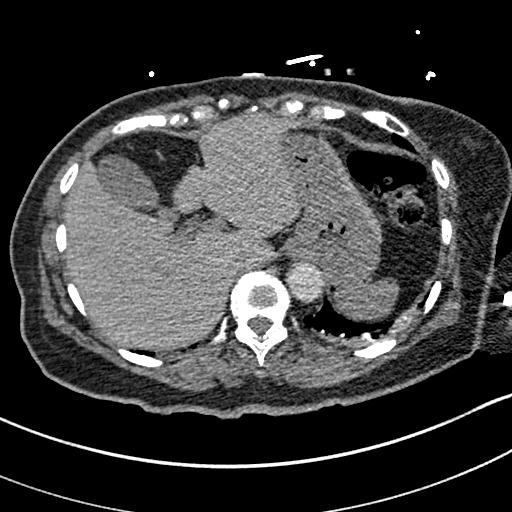
[im 46/261  lung]
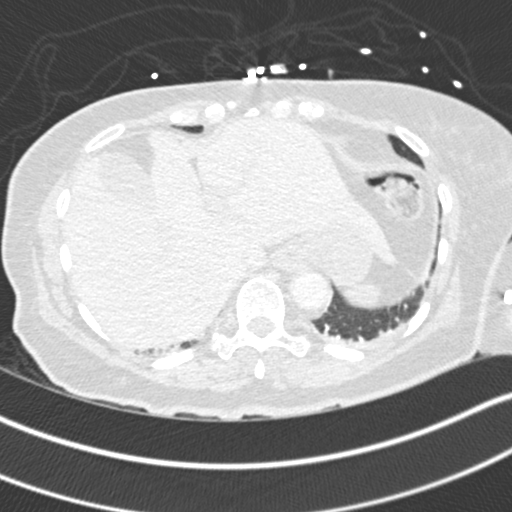
[im 57/261  soft-tissue]
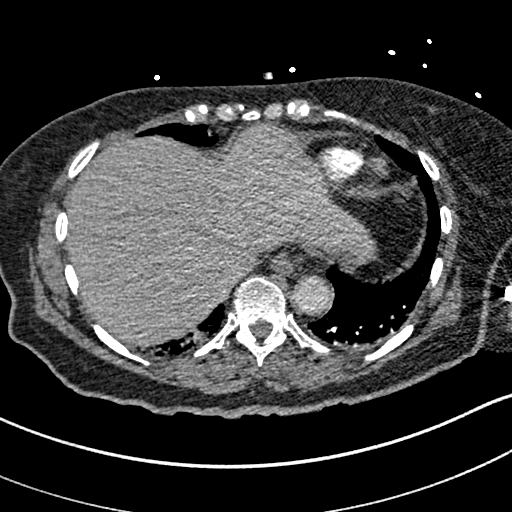
[im 80/261  lung]
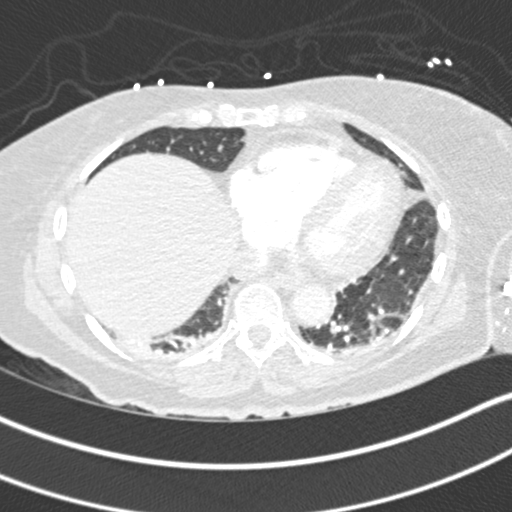
[im 91/261  soft-tissue]
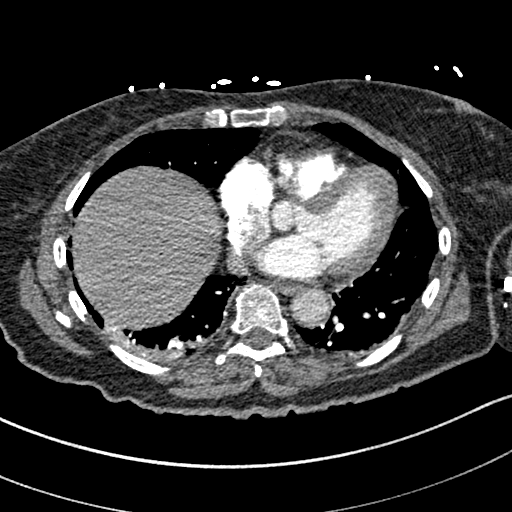
[im 102/261  lung]
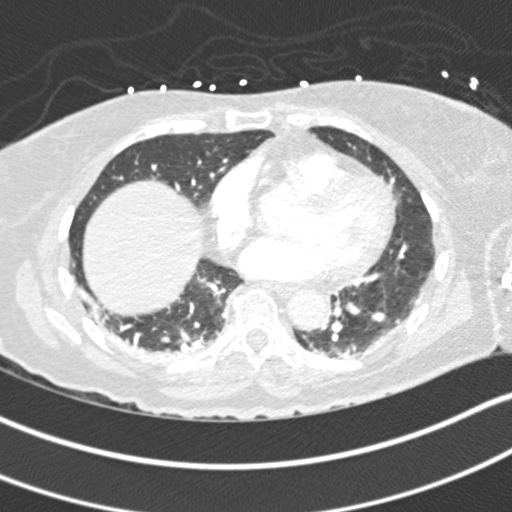
[im 125/261  soft-tissue]
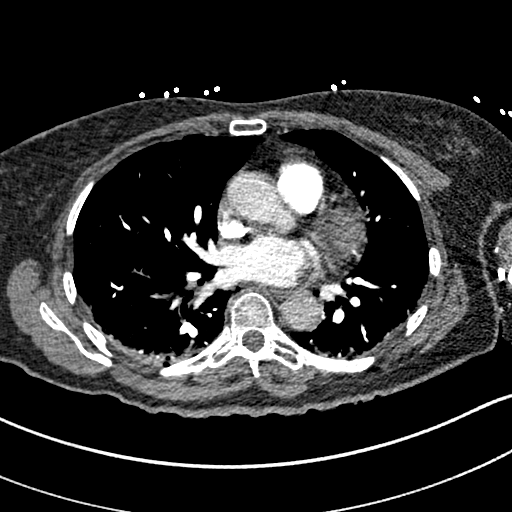
[im 136/261  lung]
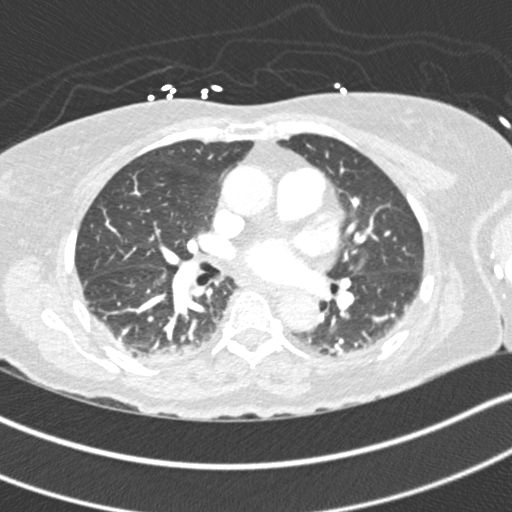
[im 159/261  soft-tissue]
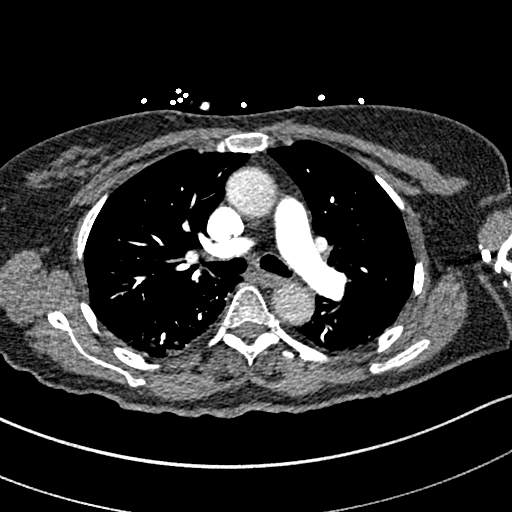
[im 170/261  lung]
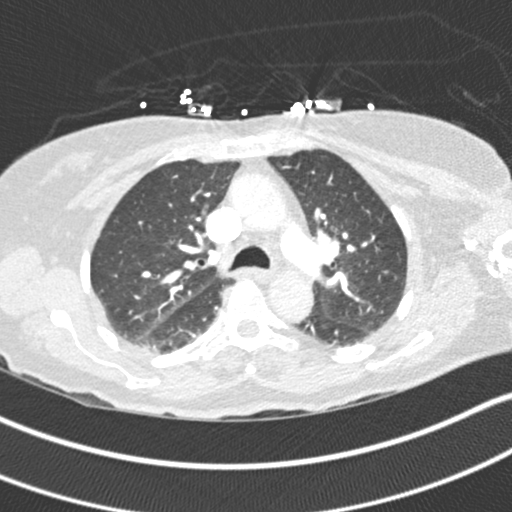
[im 181/261  soft-tissue]
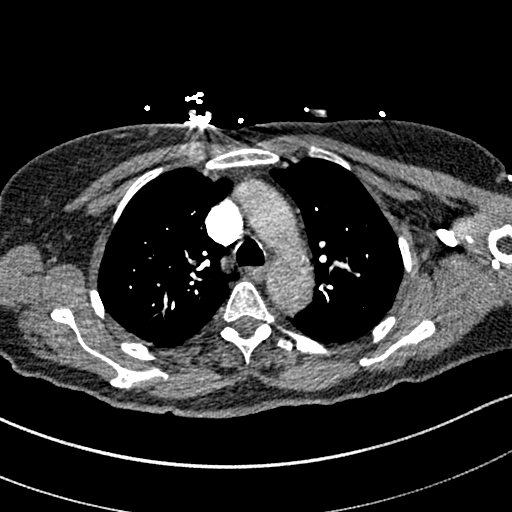
[im 204/261  lung]
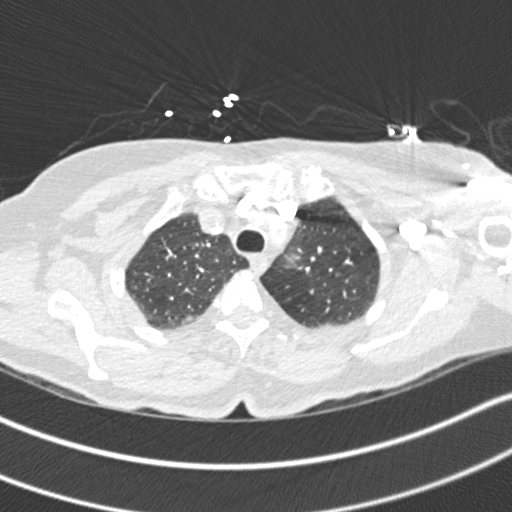
[im 215/261  soft-tissue]
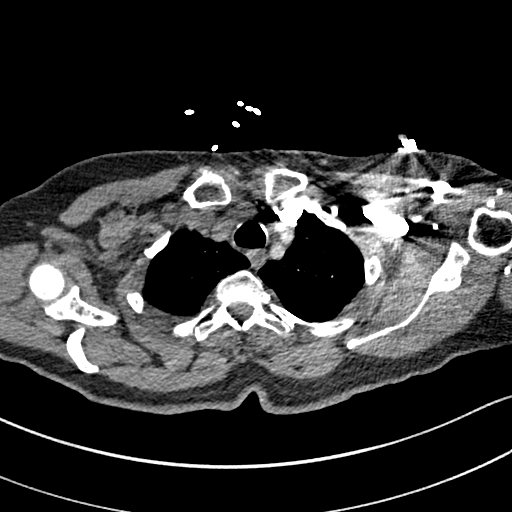
[im 227/261  lung]
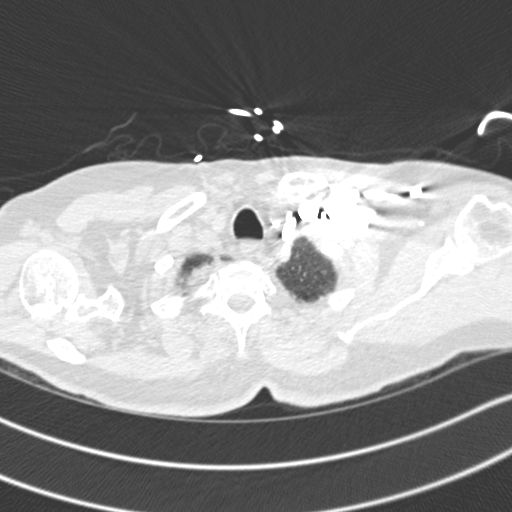
[im 249/261  soft-tissue]
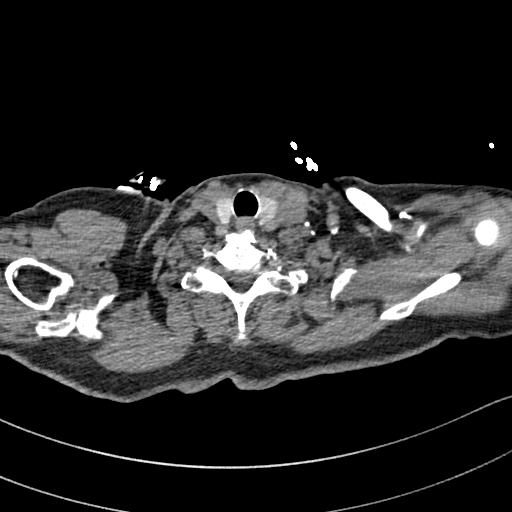

[Series 8: coronal mpr · coronal · 0.52mm/px · 3 of 110 slices shown]
[im 28/110  soft-tissue]
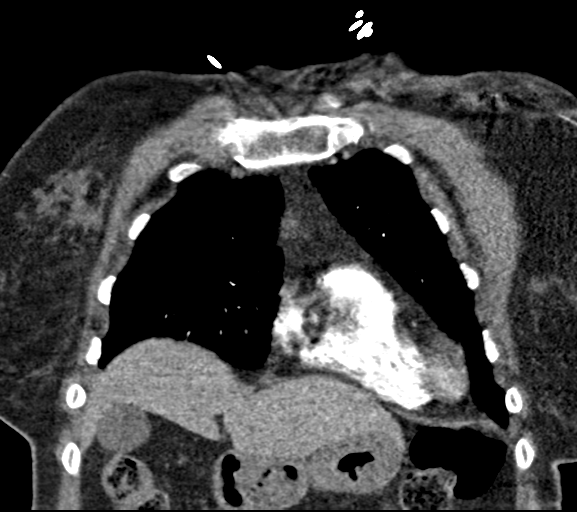
[im 55/110  soft-tissue]
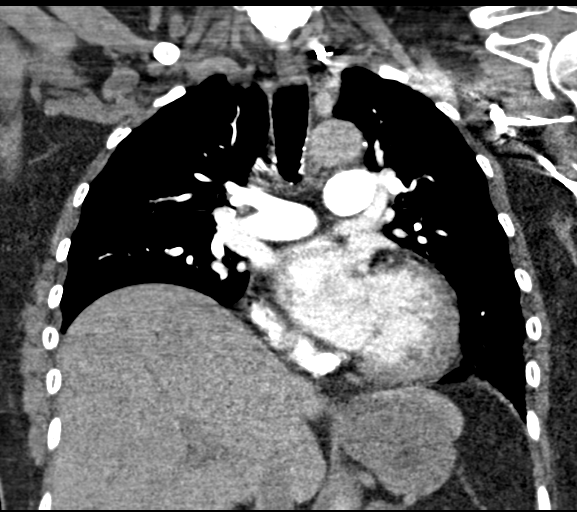
[im 82/110  soft-tissue]
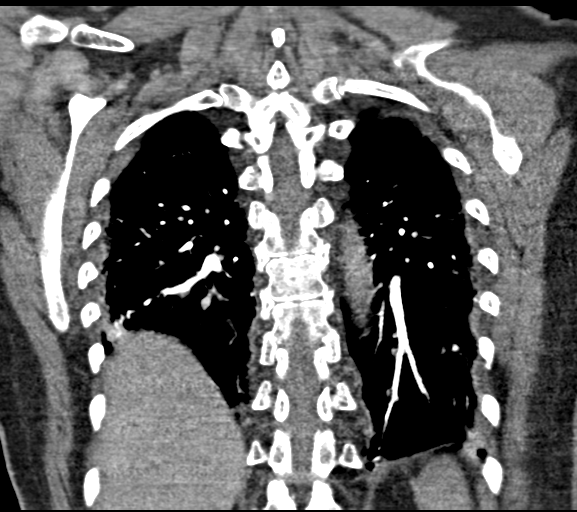

[19 of 46 positions shown; findings below may reference images not displayed]

FINDINGS: Cardiovascular: Normal heart size. No significant pericardial
effusion. Lad coronary stenting. Essentially non-opacified aorta. No
pulmonary artery filling defect

Mediastinum/Nodes: Negative for adenopathy or mass

Lungs/Pleura: Mild dependent atelectasis. There is no edema,
consolidation, effusion, or pneumothorax.

Upper Abdomen: Negative

Musculoskeletal: No acute finding. Chronic Schmorl's node at the T11
superior endplate

Review of the MIP images confirms the above findings.
IMPRESSION: 1. Negative for pulmonary embolism.
2. Dependent atelectasis.

## 2020-11-04 IMAGING — DX DG CHEST 1V PORT
1 series · 1 of 1 positions shown · non-contrast
Comparison: Radiograph 06/17/2018

CLINICAL DATA: Awoke from sleep with chest pain.

EXAM:
PORTABLE CHEST 1 VIEW

[chest ap]
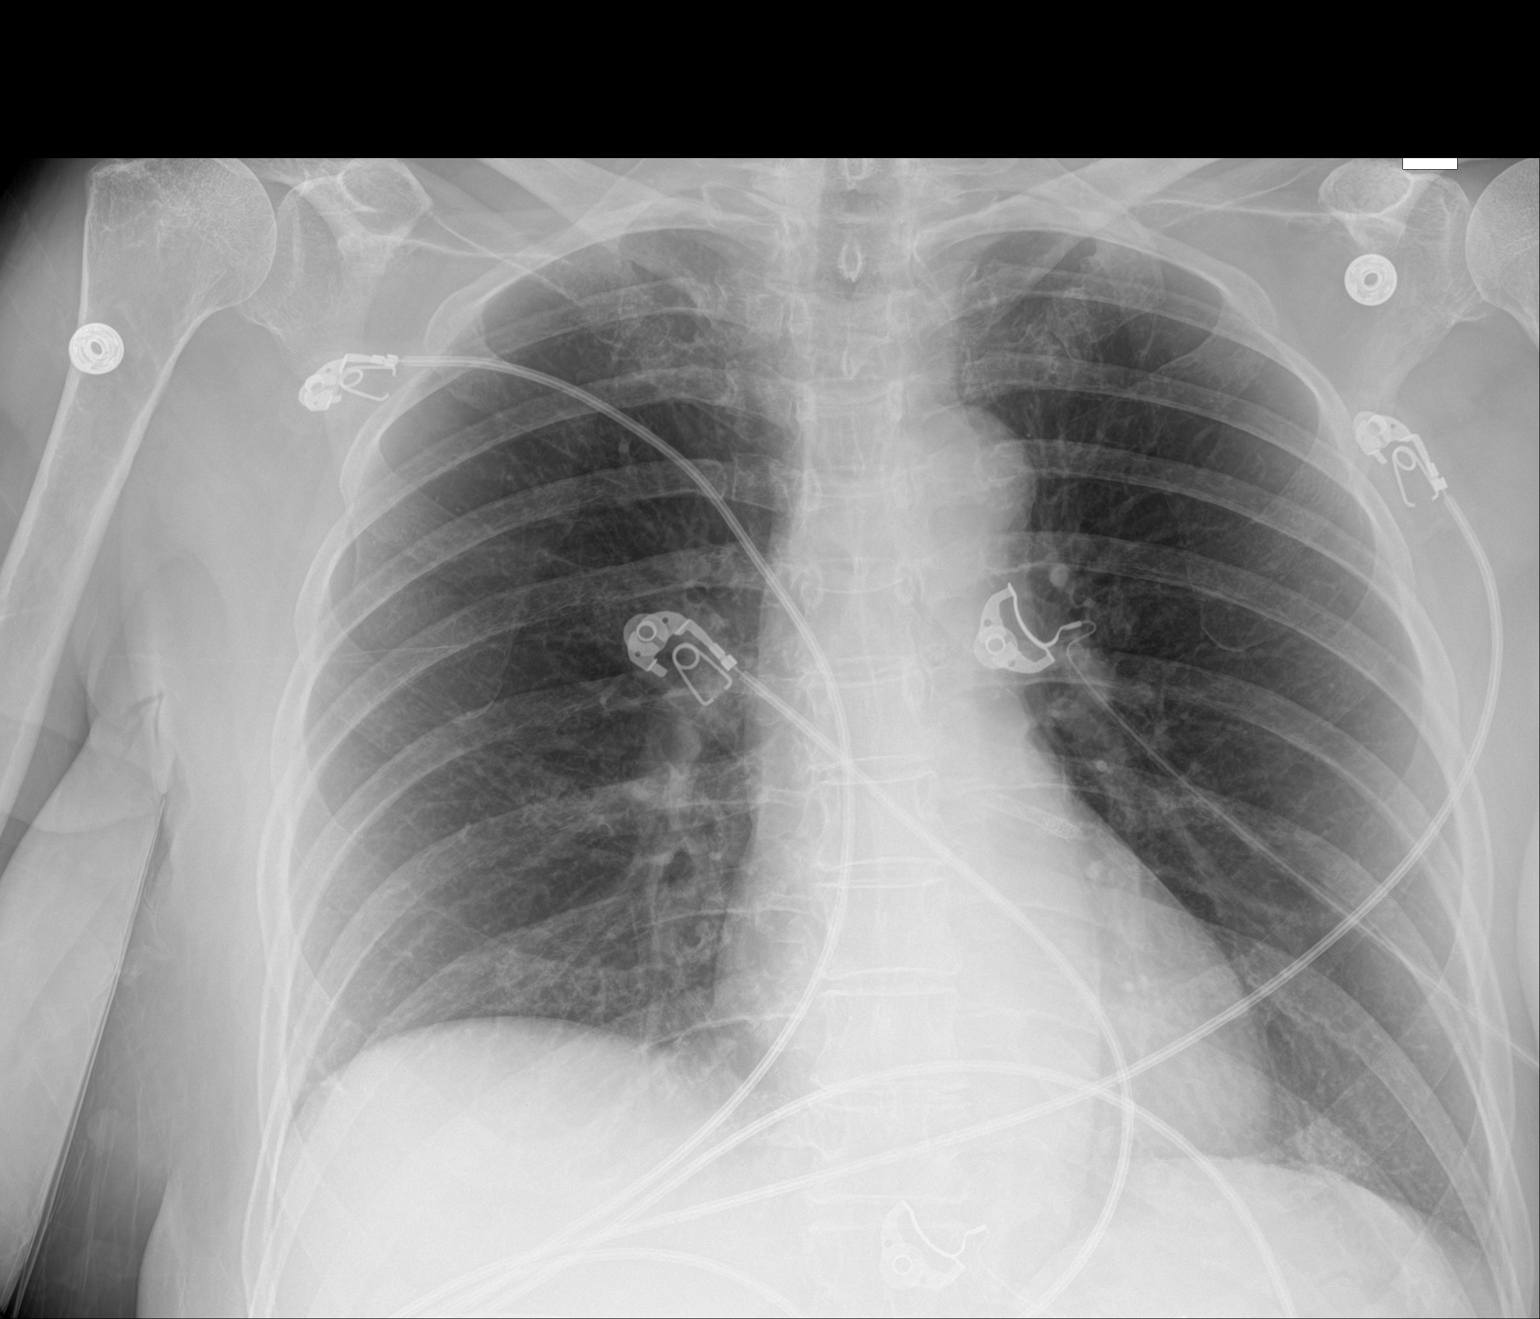

[1 of 1 positions shown; findings below may reference images not displayed]

FINDINGS: Mild chronic elevation of right hemidiaphragm.The cardiomediastinal
contours are normal. The lungs are clear. Pulmonary vasculature is
normal. No consolidation, pleural effusion, or pneumothorax. No
acute osseous abnormalities are seen.
IMPRESSION: No acute chest finding.

## 2020-11-15 ENCOUNTER — Encounter: Payer: Self-pay | Admitting: Internal Medicine

## 2020-11-15 ENCOUNTER — Other Ambulatory Visit: Payer: Self-pay

## 2020-11-15 ENCOUNTER — Ambulatory Visit (INDEPENDENT_AMBULATORY_CARE_PROVIDER_SITE_OTHER): Payer: Medicare Other | Admitting: Internal Medicine

## 2020-11-15 VITALS — BP 124/84 | HR 70 | Ht 61.5 in | Wt 137.2 lb

## 2020-11-15 DIAGNOSIS — I25119 Atherosclerotic heart disease of native coronary artery with unspecified angina pectoris: Secondary | ICD-10-CM | POA: Diagnosis not present

## 2020-11-15 DIAGNOSIS — I255 Ischemic cardiomyopathy: Secondary | ICD-10-CM | POA: Diagnosis not present

## 2020-11-15 DIAGNOSIS — I480 Paroxysmal atrial fibrillation: Secondary | ICD-10-CM

## 2020-11-15 DIAGNOSIS — I10 Essential (primary) hypertension: Secondary | ICD-10-CM | POA: Diagnosis not present

## 2020-11-15 NOTE — Patient Instructions (Addendum)
Medication Instructions:  Your physician recommends that you continue on your current medications as directed. Please refer to the Current Medication list given to you today.  Labwork: None ordered.  Testing/Procedures: Echo in 3 months Your physician has requested that you have an echocardiogram. Echocardiography is a painless test that uses sound waves to create images of your heart. It provides your doctor with information about the size and shape of your heart and how well your heart's chambers and valves are working. This procedure takes approximately one hour. There are no restrictions for this procedure.   Follow-Up: Your physician wants you to follow-up in: 3 months with Thompson Grayer, MD    Any Other Special Instructions Will Be Listed Below (If Applicable).  If you need a refill on your cardiac medications before your next appointment, please call your pharmacy.

## 2020-11-15 NOTE — Progress Notes (Signed)
PCP: Shannon Minium, MD Primary Cardiologist: Dr Shannon Cummings is a 75 y.o. female who presents today for routine electrophysiology followup.  Since his recent afib ablation, the patient reports doing very well.  she denies procedure related complications and is pleased with the results of the procedure.  Today, she denies symptoms of palpitations, chest pain, shortness of breath,  lower extremity edema, dizziness, presyncope, or syncope.  The patient is otherwise without complaint today.   Past Medical History:  Diagnosis Date   Arthritis    CAD (coronary artery disease)    a. prior stenting history. b. NSTEMI s/p DES to prox LAD with EF 45% by cath 08/14/12. c. Mild trop elevation shortly after cath ?mild plaque embolization - patent stent on relook. // d. Myoview 8/18: EF 60, normal perfusion; Low Risk. e. NSTEMI 06/2018- patent prior LAD stent, 80% OM1 s/p DES, normal LVEDP, EF 45-50%, moderate residual disease treated medically.   Fatty infiltration of liver    GERD (gastroesophageal reflux disease)    Hyperlipidemia    Hypertension    Hypokalemia    Hypotension    Myocardial infarction ([place]) 2014   OSA on CPAP    Osteoporosis    PAF (paroxysmal atrial fibrillation) ([place])    On rythmol previously   Pulmonary nodule    a. 56mm subpleural nodular density CT 08/2012, instructed to f/u pulm MD.   Sinus bradycardia    Tubular adenoma of colon 2007   Past Surgical History:  Procedure Laterality Date   ATRIAL FIBRILLATION ABLATION N/A 07/27/2020   Procedure: ATRIAL FIBRILLATION ABLATION;  Surgeon: Shannon Grayer, MD;  Location: [place] [facility] LAB;  Service: Cardiovascular;  Laterality: N/A;   CORONARY ANGIOPLASTY WITH STENT PLACEMENT  08/14/2012   LAD  Dr Shannon Cummings   CORONARY STENT INTERVENTION N/A 06/14/2018   Procedure: CORONARY STENT INTERVENTION;  Surgeon: Shannon Booze, MD;  Location: [place] [facility] LAB;  Service: Cardiovascular;  Laterality: N/A;    CORONARY STENT PLACEMENT  2000   hair restoration  02/2017   LEFT HEART CATH AND CORONARY ANGIOGRAPHY N/A 06/14/2018   Procedure: LEFT HEART CATH AND CORONARY ANGIOGRAPHY;  Surgeon: Shannon Booze, MD;  Location: [place] CV LAB;  Service: Cardiovascular;  Laterality: N/A;   LEFT HEART CATH AND CORONARY ANGIOGRAPHY N/A 05/14/2019   Procedure: LEFT HEART CATH AND CORONARY ANGIOGRAPHY;  Surgeon: Shannon Mocha, MD;  Location: [place] CV LAB;  Service: Cardiovascular;  Laterality: N/A;   LEFT HEART CATHETERIZATION WITH CORONARY ANGIOGRAM N/A 08/14/2012   Procedure: LEFT HEART CATHETERIZATION WITH CORONARY ANGIOGRAM;  Surgeon: Shannon Mocha, MD;  Location: [facility] CATH LAB;  Service: Cardiovascular;  Laterality: N/A;   LEFT HEART CATHETERIZATION WITH CORONARY ANGIOGRAM N/A 08/19/2012   Procedure: LEFT HEART CATHETERIZATION WITH CORONARY ANGIOGRAM;  Surgeon: Shannon Mocha, MD;  Location: [facility] CATH LAB;  Service: Cardiovascular;  Laterality: N/A;   ROTATOR CUFF REPAIR     TONSILLECTOMY     TOTAL ABDOMINAL HYSTERECTOMY      ROS- all systems are personally reviewed and negatives except as per HPI above  Current Outpatient Medications  Medication Sig Dispense Refill   acetaminophen (TYLENOL) 650 MG CR tablet Take 650 mg by mouth every 8 (eight) hours as needed for pain.     Alirocumab (PRALUENT) 75 MG/ML SOAJ Inject 75 mg into the skin every 14 (fourteen) days. 6 mL 3   Bempedoic Acid (NEXLETOL) 180 MG TABS Take 1 tablet by mouth daily. 90 tablet  1   Cholecalciferol (VITAMIN D3) 50 MCG (2000 UT) capsule Take 2,000 Units by mouth daily.     dicyclomine (BENTYL) 20 MG tablet TID prn abdominal pain/spasm 60 tablet 0   ELIQUIS 5 MG TABS tablet TAKE 1 TABLET(5 MG) BY MOUTH TWICE DAILY 180 tablet 1   fluticasone (FLONASE) 50 MCG/ACT nasal spray Place 2 sprays into both nostrils daily as needed for allergies or rhinitis. 48 g 1   losartan (COZAAR) 25 MG tablet TAKE 2 TABLETS(50 MG) BY MOUTH DAILY 180  tablet 3   nitroGLYCERIN (NITROSTAT) 0.4 MG SL tablet DISSOLVE ONE TABLET UNDER TONGUE AS NEEDED FOR CHEST PAIN FOR UP TO 3 DOSES 25 tablet 3   pantoprazole (PROTONIX) 40 MG tablet TAKE 1 TABLET(40 MG) BY MOUTH DAILY 90 tablet 1   Polyethyl Glycol-Propyl Glycol (SYSTANE) 0.4-0.3 % SOLN Place 1 drop into both eyes 3 (three) times daily.     potassium chloride (KLOR-CON) 10 MEQ tablet TAKE 1 TABLET(10 MEQ) BY MOUTH DAILY 90 tablet 3   propranolol (INDERAL) 10 MG tablet Take 10 mg by mouth 4 (four) times daily as needed (for A Fib Attacks).     saccharomyces boulardii (FLORASTOR) 250 MG capsule Take 1 capsule (250 mg total) by mouth 2 (two) times daily. 90 capsule 0   spironolactone (ALDACTONE) 25 MG tablet TAKE 1/2 TABLET(12.5 MG) BY MOUTH DAILY 90 tablet 1   tiZANidine (ZANAFLEX) 4 MG tablet Take 1 tablet (4 mg total) by mouth every 8 (eight) hours as needed for muscle spasms. 30 tablet 0   amLODipine (NORVASC) 2.5 MG tablet Take 1 tablet (2.5 mg total) by mouth daily. 30 tablet 11   No current facility-administered medications for this visit.    Physical Exam: Vitals:   11/15/20 1233  BP: 124/84  Pulse: 70  SpO2: 96%  Weight: 137 lb 3.2 oz (62.2 kg)  Height: 5' 1.5" (1.562 m)    GEN- The patient is well appearing, alert and oriented x 3 today.   Head- normocephalic, atraumatic Eyes-  Sclera clear, conjunctiva pink Ears- hearing intact Oropharynx- clear Lungs- Clear to ausculation bilaterally, normal work of breathing Heart- Regular rate and rhythm, no murmurs, rubs or gallops, PMI not laterally displaced GI- soft, NT, ND, + BS Extremities- no clubbing, cyanosis, or edema  EKG tracing ordered today is personally reviewed and shows sinus rhythm  Assessment and Plan:  1. Paroxysmal atrial fibrillation Doing well s/p ablation off amiodarone  chads2vasc score is 5.  She is on eliquis  2. Ischemic CM/ CAD S/p prior PCI Doing well, without ischemic symptoms Repeat echo in 3  months to assess improvement with sinus rhythm  3. HL Stable No change required today   Return to see me in 3 months  Shannon Grayer MD, [facility] 11/15/2020 12:48 PM

## 2020-11-27 ENCOUNTER — Other Ambulatory Visit: Payer: Self-pay | Admitting: Family Medicine

## 2020-11-29 ENCOUNTER — Telehealth: Payer: Self-pay | Admitting: *Deleted

## 2020-11-29 DIAGNOSIS — Z006 Encounter for examination for normal comparison and control in clinical research program: Secondary | ICD-10-CM

## 2020-11-29 NOTE — Telephone Encounter (Signed)
I callled patient for 30-day post visit for Clear Study. I left message for patient to call me.

## 2020-11-30 ENCOUNTER — Telehealth: Payer: Self-pay | Admitting: *Deleted

## 2020-11-30 DIAGNOSIS — Z006 Encounter for examination for normal comparison and control in clinical research program: Secondary | ICD-10-CM

## 2020-11-30 NOTE — Telephone Encounter (Signed)
I called patient for 30-day post study visit for Clear. I left message for patient to call me.

## 2020-11-30 NOTE — Telephone Encounter (Signed)
Patient called me back for 30-day post visit Clear Study. Patient is doing well. I reminded her to follow-up with cardiologist for management of cholesterol. I reminded her to eat heart healthy diet and maintain regular exercise program. I thanked her for doing the Clear Study.

## 2020-12-01 ENCOUNTER — Encounter: Payer: Self-pay | Admitting: *Deleted

## 2021-01-17 ENCOUNTER — Other Ambulatory Visit: Payer: Self-pay | Admitting: *Deleted

## 2021-01-17 MED ORDER — NEXLETOL 180 MG PO TABS: 1.0000 | ORAL_TABLET | Freq: Every day | ORAL | 2 refills | Status: DC

## 2021-02-21 ENCOUNTER — Ambulatory Visit (HOSPITAL_COMMUNITY): Payer: Medicare Other | Attending: Cardiology

## 2021-02-21 ENCOUNTER — Other Ambulatory Visit: Payer: Self-pay

## 2021-02-21 DIAGNOSIS — I255 Ischemic cardiomyopathy: Secondary | ICD-10-CM | POA: Insufficient documentation

## 2021-02-21 DIAGNOSIS — I10 Essential (primary) hypertension: Secondary | ICD-10-CM | POA: Insufficient documentation

## 2021-02-21 DIAGNOSIS — I25119 Atherosclerotic heart disease of native coronary artery with unspecified angina pectoris: Secondary | ICD-10-CM | POA: Diagnosis not present

## 2021-02-21 DIAGNOSIS — I480 Paroxysmal atrial fibrillation: Secondary | ICD-10-CM | POA: Diagnosis not present

## 2021-02-21 LAB — ECHOCARDIOGRAM COMPLETE
Area-P 1/2: 3.37 cm2
S' Lateral: 2.6 cm

## 2021-02-23 ENCOUNTER — Ambulatory Visit: Payer: Medicare Other | Admitting: Internal Medicine

## 2021-02-23 DIAGNOSIS — I25119 Atherosclerotic heart disease of native coronary artery with unspecified angina pectoris: Secondary | ICD-10-CM

## 2021-02-23 DIAGNOSIS — I480 Paroxysmal atrial fibrillation: Secondary | ICD-10-CM

## 2021-02-23 DIAGNOSIS — I255 Ischemic cardiomyopathy: Secondary | ICD-10-CM

## 2021-02-23 DIAGNOSIS — E7800 Pure hypercholesterolemia, unspecified: Secondary | ICD-10-CM

## 2021-02-25 ENCOUNTER — Other Ambulatory Visit: Payer: Self-pay | Admitting: Cardiovascular Disease

## 2021-02-25 NOTE — Telephone Encounter (Signed)
Prescription refill request for Eliquis received. Indication:Afib  Last office visit: 11/15/20 ([place]) Scr: 0.83 (10/29/20) Age: 75 Weight: 62.2kg  Appropriate dose and refill sent to requested pharmacy.

## 2021-03-09 ENCOUNTER — Ambulatory Visit (INDEPENDENT_AMBULATORY_CARE_PROVIDER_SITE_OTHER): Payer: Medicare Other | Admitting: Internal Medicine

## 2021-03-09 ENCOUNTER — Other Ambulatory Visit: Payer: Self-pay

## 2021-03-09 VITALS — BP 142/80 | HR 62 | Ht 61.5 in | Wt 135.4 lb

## 2021-03-09 DIAGNOSIS — I25119 Atherosclerotic heart disease of native coronary artery with unspecified angina pectoris: Secondary | ICD-10-CM

## 2021-03-09 DIAGNOSIS — I480 Paroxysmal atrial fibrillation: Secondary | ICD-10-CM

## 2021-03-09 DIAGNOSIS — E7800 Pure hypercholesterolemia, unspecified: Secondary | ICD-10-CM

## 2021-03-09 DIAGNOSIS — I255 Ischemic cardiomyopathy: Secondary | ICD-10-CM | POA: Diagnosis not present

## 2021-03-09 NOTE — Progress Notes (Signed)
PCP: [name], MD Primary Cardiologist: Dr Acie Fredrickson Primary EP: Dr Rayann Heman  Shannon Cummings is a 75 y.o. female who presents today for routine electrophysiology followup.  Since last being seen in our clinic, the patient reports doing very well.  Today, she denies symptoms of palpitations, chest pain, shortness of breath,  lower extremity edema, dizziness, presyncope, or syncope.  The patient is otherwise without complaint today.   Past Medical History:  Diagnosis Date   Arthritis    CAD (coronary artery disease)    a. prior stenting history. b. NSTEMI s/p DES to prox LAD with EF 45% by cath 08/14/12. c. Mild trop elevation shortly after cath ?mild plaque embolization - patent stent on relook. // d. Myoview 8/18: EF 60, normal perfusion; Low Risk. e. NSTEMI 06/2018- patent prior LAD stent, 80% OM1 s/p DES, normal LVEDP, EF 45-50%, moderate residual disease treated medically.   Fatty infiltration of liver    GERD (gastroesophageal reflux disease)    Hyperlipidemia    Hypertension    Hypokalemia    Hypotension    Myocardial infarction ([place]) 2014   OSA on CPAP    Osteoporosis    PAF (paroxysmal atrial fibrillation) ([place])    On rythmol previously   Pulmonary nodule    a. 50mm subpleural nodular density CT 08/2012, instructed to f/u pulm MD.   Sinus bradycardia    Tubular adenoma of colon 2007   Past Surgical History:  Procedure Laterality Date   ATRIAL FIBRILLATION ABLATION N/A 07/27/2020   Procedure: ATRIAL FIBRILLATION ABLATION;  Surgeon: Thompson Grayer, MD;  Location: [facility];  Service: Cardiovascular;  Laterality: N/A;   CORONARY ANGIOPLASTY WITH STENT PLACEMENT  08/14/2012   LAD  Dr [name]   CORONARY STENT INTERVENTION N/A 06/14/2018   Procedure: CORONARY STENT INTERVENTION;  Surgeon: Jettie Booze, MD;  Location: [facility];  Service: Cardiovascular;  Laterality: N/A;   CORONARY STENT PLACEMENT  2000   hair restoration  02/2017   LEFT  HEART CATH AND CORONARY ANGIOGRAPHY N/A 06/14/2018   Procedure: LEFT HEART CATH AND CORONARY ANGIOGRAPHY;  Surgeon: Jettie Booze, MD;  Location: [facility];  Service: Cardiovascular;  Laterality: N/A;   LEFT HEART CATH AND CORONARY ANGIOGRAPHY N/A 05/14/2019   Procedure: LEFT HEART CATH AND CORONARY ANGIOGRAPHY;  Surgeon: [name], MD;  Location: [facility];  Service: Cardiovascular;  Laterality: N/A;   LEFT HEART CATHETERIZATION WITH CORONARY ANGIOGRAM N/A 08/14/2012   Procedure: LEFT HEART CATHETERIZATION WITH CORONARY ANGIOGRAM;  Surgeon: [name], MD;  Location: [facility] CATH LAB;  Service: Cardiovascular;  Laterality: N/A;   LEFT HEART CATHETERIZATION WITH CORONARY ANGIOGRAM N/A 08/19/2012   Procedure: LEFT HEART CATHETERIZATION WITH CORONARY ANGIOGRAM;  Surgeon: [name], MD;  Location: [facility] CATH LAB;  Service: Cardiovascular;  Laterality: N/A;   ROTATOR CUFF REPAIR     TONSILLECTOMY     TOTAL ABDOMINAL HYSTERECTOMY      ROS- all systems are reviewed and negatives except as per HPI above  Current Outpatient Medications  Medication Sig Dispense Refill   acetaminophen (TYLENOL) 650 MG CR tablet Take 650 mg by mouth every 8 (eight) hours as needed for pain.     Alirocumab (PRALUENT) 75 MG/ML SOAJ Inject 75 mg into the skin every 14 (fourteen) days. 6 mL 3   Bempedoic Acid (NEXLETOL) 180 MG TABS Take 1 tablet by mouth daily. 90 tablet 2   Cholecalciferol (VITAMIN D3) 50 MCG (2000  UT) capsule Take 2,000 Units by mouth daily.     dicyclomine (BENTYL) 20 MG tablet TID prn abdominal pain/spasm 60 tablet 0   ELIQUIS 5 MG TABS tablet TAKE 1 TABLET(5 MG) BY MOUTH TWICE DAILY 180 tablet 1   fluticasone (FLONASE) 50 MCG/ACT nasal spray Place 2 sprays into both nostrils daily as needed for allergies or rhinitis. 48 g 1   losartan (COZAAR) 25 MG tablet TAKE 2 TABLETS(50 MG) BY MOUTH DAILY 180 tablet 0   nitroGLYCERIN (NITROSTAT) 0.4 MG SL tablet DISSOLVE ONE TABLET  UNDER TONGUE AS NEEDED FOR CHEST PAIN FOR UP TO 3 DOSES 25 tablet 3   pantoprazole (PROTONIX) 40 MG tablet TAKE 1 TABLET(40 MG) BY MOUTH DAILY 90 tablet 1   Polyethyl Glycol-Propyl Glycol (SYSTANE) 0.4-0.3 % SOLN Place 1 drop into both eyes 3 (three) times daily.     potassium chloride (KLOR-CON) 10 MEQ tablet TAKE 1 TABLET(10 MEQ) BY MOUTH DAILY 90 tablet 3   propranolol (INDERAL) 10 MG tablet Take 10 mg by mouth 4 (four) times daily as needed (for A Fib Attacks).     saccharomyces boulardii (FLORASTOR) 250 MG capsule Take 1 capsule (250 mg total) by mouth 2 (two) times daily. 90 capsule 0   spironolactone (ALDACTONE) 25 MG tablet TAKE 1/2 TABLET(12.5 MG) BY MOUTH DAILY 90 tablet 1   tiZANidine (ZANAFLEX) 4 MG tablet Take 1 tablet (4 mg total) by mouth every 8 (eight) hours as needed for muscle spasms. 30 tablet 0   No current facility-administered medications for this visit.    Physical Exam: Vitals:   03/09/21 1511  BP: (!) 142/80  Pulse: 62  SpO2: 96%  Weight: 135 lb 6.4 oz (61.4 kg)  Height: 5' 1.5" (1.562 m)    GEN- The patient is well appearing, alert and oriented x 3 today.   Head- normocephalic, atraumatic Eyes-  Sclera clear, conjunctiva pink Ears- hearing intact Oropharynx- clear Lungs- Clear to ausculation bilaterally, normal work of breathing Heart- Regular rate and rhythm, no murmurs, rubs or gallops, PMI not laterally displaced GI- soft, NT, ND, + BS Extremities- no clubbing, cyanosis, or edema  Wt Readings from Last 3 Encounters:  03/09/21 135 lb 6.4 oz (61.4 kg)  11/15/20 137 lb 3.2 oz (62.2 kg)  10/29/20 139 lb (63 kg)    EKG tracing ordered today is personally reviewed and shows sinus rhythm  Assessment and Plan:  Paroxysmal atrial fibrillation Well controlled post ablation off AAD therapy Chads2vasc score is 5.  Continue eliquis  2. Tachycardia mediated CM Echo 02/21/21 is reviewed and reveals EF 55% EF has recovered with sinus!  3. CAD No  ischemic symptoms S/p prior PCI  4. HTN Continue cozaar 25mg  daily  Risks, benefits and potential toxicities for medications prescribed and/or refilled reviewed with patient today.   Dr Acie Fredrickson in 3 months Return to AF clinic in 6 months  Thompson Grayer MD, [facility] 03/09/2021 3:29 PM

## 2021-03-09 NOTE — Patient Instructions (Signed)
Medication Instructions:  Your physician recommends that you continue on your current medications as directed. Please refer to the Current Medication list given to you today. *If you need a refill on your cardiac medications before your next appointment, please call your pharmacy*  Lab Work: None. If you have labs (blood work) drawn today and your tests are completely normal, you will receive your results only by: [place] (if you have MyChart) OR A paper copy in the mail If you have any lab test that is abnormal or we need to change your treatment, we will call you to review the results.  Testing/Procedures: None.  Follow-Up: At [facility], [facility], [facility], you and your health needs are our priority.  As part of our continuing mission to provide you with exceptional heart care, we have created designated Provider Care Teams.  These Care Teams include your primary Cardiologist (physician) and Advanced Practice Providers (APPs -  Physician Assistants and Nurse Practitioners) who all work together to provide you with the care you need, when you need it.  Your physician wants you to follow-up in: 3 months with Dr. Acie Fredrickson.  6 months with the [facility] Clinic. They will contact you to schedule   We recommend signing up for the patient portal called "MyChart".  Sign up information is provided on this After Visit Summary.  MyChart is used to connect with patients for Virtual Visits (Telemedicine).  Patients are able to view lab/test results, encounter notes, upcoming appointments, etc.  Non-urgent messages can be sent to your provider as well.   To learn more about what you can do with MyChart, go to [email or web address].    Any Other Special Instructions Will Be Listed Below (If Applicable).

## 2021-03-17 DIAGNOSIS — L718 Other rosacea: Secondary | ICD-10-CM | POA: Diagnosis not present

## 2021-03-17 DIAGNOSIS — Z789 Other specified health status: Secondary | ICD-10-CM | POA: Diagnosis not present

## 2021-03-17 DIAGNOSIS — L538 Other specified erythematous conditions: Secondary | ICD-10-CM | POA: Diagnosis not present

## 2021-03-17 DIAGNOSIS — L821 Other seborrheic keratosis: Secondary | ICD-10-CM | POA: Diagnosis not present

## 2021-03-17 DIAGNOSIS — L820 Inflamed seborrheic keratosis: Secondary | ICD-10-CM | POA: Diagnosis not present

## 2021-03-17 DIAGNOSIS — R208 Other disturbances of skin sensation: Secondary | ICD-10-CM | POA: Diagnosis not present

## 2021-03-31 DIAGNOSIS — Z23 Encounter for immunization: Secondary | ICD-10-CM | POA: Diagnosis not present

## 2021-04-08 ENCOUNTER — Ambulatory Visit: Payer: Medicare Other | Admitting: Gastroenterology

## 2021-04-18 DIAGNOSIS — L718 Other rosacea: Secondary | ICD-10-CM | POA: Diagnosis not present

## 2021-05-10 ENCOUNTER — Encounter: Payer: Self-pay | Admitting: Gastroenterology

## 2021-05-10 ENCOUNTER — Telehealth: Payer: Self-pay

## 2021-05-10 ENCOUNTER — Ambulatory Visit (INDEPENDENT_AMBULATORY_CARE_PROVIDER_SITE_OTHER): Payer: Medicare Other | Admitting: Gastroenterology

## 2021-05-10 VITALS — BP 120/80 | HR 76 | Ht 60.0 in | Wt 133.0 lb

## 2021-05-10 DIAGNOSIS — Z86010 Personal history of colonic polyps: Secondary | ICD-10-CM

## 2021-05-10 DIAGNOSIS — Z7901 Long term (current) use of anticoagulants: Secondary | ICD-10-CM | POA: Diagnosis not present

## 2021-05-10 DIAGNOSIS — I255 Ischemic cardiomyopathy: Secondary | ICD-10-CM

## 2021-05-10 MED ORDER — PLENVU 140 G PO SOLR: 1.0000 | Freq: Once | ORAL | 0 refills | Status: AC

## 2021-05-10 NOTE — Patient Instructions (Signed)
You have been scheduled for a colonoscopy. Please follow written instructions given to you at your visit today.  Please pick up your prep supplies at the pharmacy within the next 1-3 days. If you use inhalers (even only as needed), please bring them with you on the day of your procedure.  The [facility] providers would like to encourage you to use MYCHART to communicate with providers for non-urgent requests or questions.  Due to long hold times on the telephone, sending your provider a message by MYCHART may be a faster and more efficient way to get a response.  Please allow 48 business hours for a response.  Please remember that this is for non-urgent requests.   Due to recent changes in healthcare laws, you may see the results of your imaging and laboratory studies on MyChart before your provider has had a chance to review them.  We understand that in some cases there may be results that are confusing or concerning to you. Not all laboratory results come back in the same time frame and the provider may be waiting for multiple results in order to interpret others.  Please give us 48 hours in order for your provider to thoroughly review all the results before contacting the office for clarification of your results.   Thank you for choosing me and [facility].  Malcolm T. Stark, Jr., MD., FACG  

## 2021-05-10 NOTE — Telephone Encounter (Signed)
[facility] Pre-operative Risk Assessment     Request for surgical clearance:     Endoscopy Procedure  What type of surgery is being performed?     Colonoscopy  When is this surgery scheduled?     07/04/21  What type of clearance is required ?   Pharmacy  Are there any medications that need to be held prior to surgery and how long? Eliquis x 2 days  Practice name and name of physician performing surgery?      [facility]  What is your office phone and fax number?      Phone- [phone]  [phone]  Anesthesia type (None, local, MAC, general) ?       MAC

## 2021-05-10 NOTE — Progress Notes (Signed)
    History of Present Illness: This is a 74 year old female with history of adenomatous colon polyps.  She returns today for consideration of surveillance colonoscopy.  Three adenomatous polyps including an 11 mm tubular adenoma were removed in October 2017 and a 3-year interval surveillance colonoscopy was recommended.  She did not have that performed in 2020 due to COVID and a cardiac intervention with stents placed in January 2020. She had a repeat cath in December 2020 without intervention.  She was maintained on Plavix and Eliquis.  Plavix has been discontinued and she remains on Eliquis. She was treated for C. difficile in 2021.  Since recovering from she has noted of intermittent loose stools.  She presumes they are dietary intolerances.  She continues taking Florastor daily per her choice.  Denies weight loss, abdominal pain, constipation, change in stool caliber, melena, hematochezia, nausea, vomiting, dysphagia, reflux symptoms, chest pain.   Current Medications, Allergies, Past Medical History, Past Surgical History, Family History and Social History were reviewed in [employer] record.   Physical Exam: General: Well developed, well nourished, no acute distress Head: Normocephalic and atraumatic Eyes: Sclerae anicteric, EOMI Ears: Normal auditory acuity Mouth: Not examined, mask on during Covid-19 pandemic Lungs: Clear throughout to auscultation Heart: Regular rate and rhythm; no murmurs, rubs or bruits Abdomen: Soft, non tender and non distended. No masses, hepatosplenomegaly or hernias noted. Normal Bowel sounds Rectal: Deferred to colonoscopy  Musculoskeletal: Symmetrical with no gross deformities  Pulses:  Normal pulses noted Extremities: No clubbing, cyanosis, edema or deformities noted Neurological: Alert oriented x 4, grossly nonfocal Psychological:  Alert and cooperative. Normal mood and affect   Assessment and Recommendations:  Personal history of  3 adenomatous colon polyps removed in October 2017.  She is overdue for surveillance colonoscopy.  Schedule colonoscopy. The risks (including bleeding, perforation, infection, missed lesions, medication reactions and possible hospitalization or surgery if complications occur), benefits, and alternatives to colonoscopy with possible biopsy and possible polypectomy were discussed with the patient and they consent to proceed.   Paroxysmal A. fib.  Hold Eliquis 2 days before procedure - will instruct when and how to resume after procedure. Low but real risk of cardiovascular event such as heart attack, stroke, embolism, thrombosis or ischemia/infarct of other organs off Eliquis explained and need to seek urgent help if this occurs. The patient consents to proceed. Will communicate by phone or EMR with patient's prescribing provider to confirm that holding Eliquis is reasonable in this case.

## 2021-05-11 NOTE — Telephone Encounter (Signed)
Patient with diagnosis of afib on Eliquis for anticoagulation.    Procedure: colonoscopy Date of procedure: 07/04/21  CHA2DS2-VASc Score = 4  This indicates a 4.8% annual risk of stroke. The patient's score is based upon: CHF History: 0 HTN History: 1 Diabetes History: 0 Stroke History: 0 Vascular Disease History: 1 Age Score: 1 Gender Score: 1   CHADS2VASC score will increase to 5 when she has procedure done as she will turn 76 before then.  CrCl 26mL/min using adjusted body weight Platelet count 225K  Per office protocol, patient can hold Eliquis for 2 days prior to procedure as requested.

## 2021-05-11 NOTE — Telephone Encounter (Signed)
Name: Shannon Cummings  DOB: [birth year]  MRN: [id number]   Primary Cardiologist: Kristeen Miss, MD  Chart reviewed as part of pre-operative protocol coverage.   We have been asked for guidance to hold eliquis for colonoscopy.   Per our clinical pharmacist: Per office protocol, patient can hold Eliquis for 2 days prior to procedure as requested   I will route this recommendation to the requesting party via Epic fax function and remove from pre-op pool. Please call with questions.  Roe Rutherford Brnadon Eoff, PA 05/11/2021, 3:55 PM

## 2021-05-12 DIAGNOSIS — L718 Other rosacea: Secondary | ICD-10-CM | POA: Diagnosis not present

## 2021-05-13 NOTE — Telephone Encounter (Signed)
Informed patient she can hold Eliquis 2 days prior to her procedure. Patient verbalized understanding. 

## 2021-05-26 ENCOUNTER — Other Ambulatory Visit: Payer: Self-pay | Admitting: Cardiovascular Disease

## 2021-05-26 ENCOUNTER — Other Ambulatory Visit: Payer: Self-pay | Admitting: Family Medicine

## 2021-05-31 ENCOUNTER — Telehealth: Payer: Self-pay | Admitting: Family Medicine

## 2021-05-31 ENCOUNTER — Other Ambulatory Visit: Payer: Self-pay

## 2021-05-31 MED ORDER — SPIRONOLACTONE 25 MG PO TABS: ORAL_TABLET | ORAL | 0 refills | Status: DC

## 2021-05-31 NOTE — Telephone Encounter (Signed)
Left message for patient to call back and schedule Medicare Annual Wellness Visit (AWV) in office.  ° °If not able to come in office, please offer to do virtually or by telephone.  Left office number and my jabber #[phone]. ° °Last AWV:05/03/2020 ° °Please schedule at anytime with Nurse Health Advisor. °  °

## 2021-06-02 ENCOUNTER — Telehealth: Payer: Self-pay | Admitting: Family Medicine

## 2021-06-02 NOTE — Telephone Encounter (Signed)
Left message for patient to call back and schedule Medicare Annual Wellness Visit (AWV) in office.  ° °If not able to come in office, please offer to do virtually or by telephone.  Left office number and my jabber #[phone]. ° °Last AWV:05/03/2020 ° °Please schedule at anytime with Nurse Health Advisor. °  °

## 2021-06-12 ENCOUNTER — Encounter: Payer: Self-pay | Admitting: Cardiovascular Disease

## 2021-06-12 NOTE — Progress Notes (Signed)
Cardiology Office Note   Date:  06/13/2021   ID:  Shannon Cummings, Shannon Cummings 09-04-1945, MRN [id number]  PCP:  [name], MD  Cardiologist:   [name], MD   Chief Complaint  Patient presents with   Congestive Heart Failure        Hypertension        1. Coronary artery disease-status post stenting in the past.  She status post stenting of her proximal LAD  08/14/2012 2. Intermittent atrial fibrillation 3.  hyperlipidemia-she has been generally intolerant to most statins   Pt is doing well.  No cardiac complaints.     She complains of some tingling and numbness associated with the Crestor.  She has discontinued the Crestor because of that reason.  She has had reactions to most statins that we have tried.  October 08-2011  She has done well.  She has not had any angina or eposides of atrial fib.  She has retired and is traveling quite a bit.  Her husband has also retired.  She has not had any problems.  October 01, 2012:  She is having some bruising ( due to [place]).  She is enjoying the cardiac rehab.  She has noticed some low BP readings at the end of cardiac rehab.  She does not feel bad.  These low readings are typically asymptomatic.    No angina.  No arrhythmias.   August 20, 2013:  Shannon Cummings is doing ok.  She was admitted to the hospital with some CP recently.  Rule out for MI.    She has been doing [place].  Has gained a bit of weight.   She is not able to exercise because  Of left ankle problems.    August 19, 2014:   Shannon Cummings is a 76 y.o. female who presents for follow up of her atrial fib. Has gained some weight.  She is in the study for lipid management Has had leg cramps since last fall. Off and on  Has had more paroxysmal atrial fib.  Lasts about a minute. Seem to be occuring more frequently.   Jan. 18, 2017:  Shannon Cummings presents for follow up of her CAD and atrial fib.  Doing well.   Has visited [place] recently .   No CP or dyspnea.    Tolerates the Rythmol.  Talked about her daughter who lives in [place].  Is not having any significant atrial fib issues.   Has been on lipid lowering study drug at [facility]  ( injectable cholesterol medication )   Jan.   25, 2018:  Doing well.   Feeling well Has had a stomach bug last week and now has a URI  - not the flu.   Has maintained NSR .  Is having hair loss.  Her research shows that amlodipine and atenolol can cause this   Aug. 23, 2018:  Shannon Cummings is seen today for eval of some elevated BP Has been having pain in her back, tightness in her chest  Went to ER.  Has had elevated BP for a week,   Went back to ER  Doubled her metoprolol XL .  Saw Richardson Dopp on Aug. 6. 2018 Was given Imdur - developed a headache that lasted 3 days  Lexiscan myoview was low risk.   Her back pain / chest tightness has resolved.   May 08, 2017: Shannon Cummings seen today for follow-up of her hypertension, coronary artery disease, paroxysmal atrial fibrillation. Is feeling well  Is  taking the Toprol XL just once a day - not BID as listed in MAR .   August 15, 2017   Shannon Cummings is doing well BP is much better.  Is exercising and watching her diet.   Is on a study drug for cholesterol   June 18, 2018: He was admitted to the hospital recently with a non-ST segment elevation myocardial infarction. The previously placed LAD stent was widely patent.  She had a tight stenosis in her first obtuse marginal artery that was stented. Function is mildly reduced with an EF of 45 to 50%.  She returned to the ER the day after she went home with an episode of rapid AF and CP .    Troponin was  0.18  Her rhythmol has been stopped . Saw Roderic Palau and was started on Amiodarone . The plan is for short term Amio and then consider AF ablation . They also discussed Tikosyn .  She tolerates her AF poorly.  We discussed taking short acting beta blockers if she has AF with CP  Had been walking ,   Has  been weak since the MI.    Feb. 26, 2020  Shannon Cummings is seen for follow up of her CAD and PAF Has had some shortness of breath . BP is a bit elevated today  amio has been reduced to 200 mg a day  Has an appt with Dr. Rayann Heman on March 23 to discuss ablation  Is in a lipid study   Nov. 22, 2021: Shannon Cummings is seen for follow up of her CAD, PAF . She had a NSTEMI in Dec.   Propafaone was stopped after that hospitalization.  Was started on Amiodarone  Doing well from a cardiac standpoint.  Is on lots abx.  Has not been exercising .    Will check lipids, liver enz, bmp today  Is being treated for C.diff colitis   Jan. 9, 2023 Shannon Cummings is seen for follow up of her CAD, PAF She had previously been on profafanone until a NSTEMI several years ago.   Was started on amio.   We referred her for AF ablation given the side effects of amio.   Was seen by [place].  She has had an atrial fib ablation since I last saw her. Continues with Eliquis.   No cp, Not as much exercise as she should be getting  Has lost some weight  - wt is 133 lbs     Past Medical History:  Diagnosis Date   Arthritis    CAD (coronary artery disease)    a. prior stenting history. b. NSTEMI s/p DES to prox LAD with EF 45% by cath 08/14/12. c. Mild trop elevation shortly after cath ?mild plaque embolization - patent stent on relook. // d. Myoview 8/18: EF 60, normal perfusion; Low Risk. e. NSTEMI 06/2018- patent prior LAD stent, 80% OM1 s/p DES, normal LVEDP, EF 45-50%, moderate residual disease treated medically.   Fatty infiltration of liver    GERD (gastroesophageal reflux disease)    Hyperlipidemia    Hypertension    Hypokalemia    Hypotension    Myocardial infarction ([place]) 2014   OSA on CPAP    Osteoporosis    PAF (paroxysmal atrial fibrillation) ([place])    On rythmol previously   Pulmonary nodule    a. 53mm subpleural nodular density CT 08/2012, instructed to f/u pulm MD.   Sinus bradycardia    Tubular adenoma of colon  2007    Past Surgical  History:  Procedure Laterality Date   ATRIAL FIBRILLATION ABLATION N/A 07/27/2020   Procedure: ATRIAL FIBRILLATION ABLATION;  Surgeon: Thompson Grayer, MD;  Location: [place] [facility];  Service: Cardiovascular;  Laterality: N/A;   CORONARY ANGIOPLASTY WITH STENT PLACEMENT  08/14/2012   LAD  Dr [name]   CORONARY STENT INTERVENTION N/A 06/14/2018   Procedure: CORONARY STENT INTERVENTION;  Surgeon: Jettie Booze, MD;  Location: [place] [facility];  Service: Cardiovascular;  Laterality: N/A;   CORONARY STENT PLACEMENT  2000   hair restoration  02/2017   LEFT HEART CATH AND CORONARY ANGIOGRAPHY N/A 06/14/2018   Procedure: LEFT HEART CATH AND CORONARY ANGIOGRAPHY;  Surgeon: Jettie Booze, MD;  Location: [place] [facility];  Service: Cardiovascular;  Laterality: N/A;   LEFT HEART CATH AND CORONARY ANGIOGRAPHY N/A 05/14/2019   Procedure: LEFT HEART CATH AND CORONARY ANGIOGRAPHY;  Surgeon: [name], MD;  Location: [place] [facility];  Service: Cardiovascular;  Laterality: N/A;   LEFT HEART CATHETERIZATION WITH CORONARY ANGIOGRAM N/A 08/14/2012   Procedure: LEFT HEART CATHETERIZATION WITH CORONARY ANGIOGRAM;  Surgeon: [name], MD;  Location: [facility] CATH LAB;  Service: Cardiovascular;  Laterality: N/A;   LEFT HEART CATHETERIZATION WITH CORONARY ANGIOGRAM N/A 08/19/2012   Procedure: LEFT HEART CATHETERIZATION WITH CORONARY ANGIOGRAM;  Surgeon: [name], MD;  Location: [facility] CATH LAB;  Service: Cardiovascular;  Laterality: N/A;   ROTATOR CUFF REPAIR     TONSILLECTOMY     TOTAL ABDOMINAL HYSTERECTOMY       Current Outpatient Medications  Medication Sig Dispense Refill   acetaminophen (TYLENOL) 650 MG CR tablet Take 650 mg by mouth every 8 (eight) hours as needed for pain.     Alirocumab (PRALUENT) 75 MG/ML SOAJ Inject 75 mg into the skin every 14 (fourteen) days. 6 mL 3   Azelaic Acid 15 % gel Apply topically daily.     Bempedoic Acid (NEXLETOL) 180  MG TABS Take 1 tablet by mouth daily. 90 tablet 2   Cholecalciferol (VITAMIN D3) 50 MCG (2000 UT) capsule Take 2,000 Units by mouth daily.     dicyclomine (BENTYL) 20 MG tablet TID prn abdominal pain/spasm 60 tablet 0   doxycycline (MONODOX) 50 MG capsule Take 50 mg by mouth 2 (two) times daily.     ELIQUIS 5 MG TABS tablet TAKE 1 TABLET(5 MG) BY MOUTH TWICE DAILY 180 tablet 1   fluticasone (FLONASE) 50 MCG/ACT nasal spray Place 2 sprays into both nostrils daily as needed for allergies or rhinitis. 48 g 1   losartan (COZAAR) 25 MG tablet TAKE 2 TABLETS(50 MG) BY MOUTH DAILY 180 tablet 0   nitroGLYCERIN (NITROSTAT) 0.4 MG SL tablet DISSOLVE ONE TABLET UNDER TONGUE AS NEEDED FOR CHEST PAIN FOR UP TO 3 DOSES 25 tablet 3   pantoprazole (PROTONIX) 40 MG tablet TAKE 1 TABLET(40 MG) BY MOUTH DAILY 90 tablet 1   Polyethyl Glycol-Propyl Glycol (SYSTANE) 0.4-0.3 % SOLN Place 1 drop into both eyes 3 (three) times daily.     potassium chloride (KLOR-CON) 10 MEQ tablet TAKE 1 TABLET(10 MEQ) BY MOUTH DAILY 90 tablet 3   saccharomyces boulardii (FLORASTOR) 250 MG capsule Take 1 capsule (250 mg total) by mouth 2 (two) times daily. 90 capsule 0   tiZANidine (ZANAFLEX) 4 MG tablet Take 1 tablet (4 mg total) by mouth every 8 (eight) hours as needed for muscle spasms. 30 tablet 0   propranolol (INDERAL) 10 MG tablet Take 1 tablet (10 mg total) by mouth 4 (four)  times daily as needed (for A Fib Attacks). 360 tablet 3   spironolactone (ALDACTONE) 25 MG tablet TAKE 1/2 TABLET(12.5 MG) BY MOUTH DAILY. Please make overdue appt with Dr. Acie Fredrickson before anymore refills. Thank you 2nd attempt 7 tablet 0   No current facility-administered medications for this visit.    Allergies:   Effexor [venlafaxine], Flomax [tamsulosin], Isosorbide, Macrobid [nitrofurantoin], Other, Statins, and Zetia [ezetimibe]    Social History:  The patient  reports that she has never smoked. She has never used smokeless tobacco. She reports current  alcohol use of about 1.0 standard drink per week. She reports that she does not use drugs.   Family History:  The patient's family history includes Alzheimer's disease in her mother; Breast cancer in her maternal aunt; Heart disease in her father.    ROS: Noted in current history.  All other systems are negative.   Physical Exam: Blood pressure 138/84, pulse 64, height 5' (1.524 m), weight 133 lb 3.2 oz (60.4 kg), SpO2 96 %.  GEN:  Well nourished, well developed in no acute distress HEENT: Normal NECK: No JVD; No carotid bruits LYMPHATICS: No lymphadenopathy CARDIAC: RRR , no murmurs, rubs, gallops RESPIRATORY:  Clear to auscultation without rales, wheezing or rhonchi  ABDOMEN: Soft, non-tender, non-distended MUSCULOSKELETAL:  No edema; No deformity  SKIN: Warm and dry NEUROLOGIC:  Alert and oriented x 3     EKG:   .    Recent Labs: 10/29/2020: Hemoglobin 14.1; Platelets 225 06/13/2021: ALT 18; BUN 12; Creatinine, Ser 0.83; Potassium 4.0; Sodium 135    Lipid Panel    Component Value Date/Time   CHOL 178 06/13/2021 1058   TRIG 183 (H) 06/13/2021 1058   HDL 51 06/13/2021 1058   CHOLHDL 3.5 06/13/2021 1058   CHOLHDL 3.2 05/14/2019 0730   VLDL 21 05/14/2019 0730   LDLCALC 96 06/13/2021 1058   LDLDIRECT 111.0 04/28/2019 1138      Wt Readings from Last 3 Encounters:  06/13/21 133 lb 3.2 oz (60.4 kg)  05/10/21 133 lb (60.3 kg)  03/09/21 135 lb 6.4 oz (61.4 kg)      Other studies Reviewed: Additional studies/ records that were reviewed today include: . Review of the above records demonstrates:    ASSESSMENT AND PLAN:  1. Coronary artery disease-Shannon Cummings is doing well.  She is not had any episodes of chest pain or shortness of breath.  Continue current medications.   2. Intermittent atrial fibrillation -she status post A. fib ablation.  She seems to be doing very well.   3.  hyperlipidemia-she is on Praluent and Nexletol.   Check labs in 6 months.  4.  Essential HTN:     Blood pressure is well controlled.  Continue current medications.    Current medicines are reviewed at length with the patient today.  The patient does not have concerns regarding medicines.  The following changes have been made:  no change  Labs/ tests ordered today include:   Orders Placed This Encounter  Procedures   Lipid Profile   ALT   Basic Metabolic Panel (BMET)      Signed, [name], MD  06/13/2021 5:58 PM    [address], [place], [place]  [address] Phone: [phone]; Fax: [phone]

## 2021-06-13 ENCOUNTER — Other Ambulatory Visit: Payer: Self-pay

## 2021-06-13 ENCOUNTER — Encounter: Payer: Self-pay | Admitting: Cardiovascular Disease

## 2021-06-13 ENCOUNTER — Ambulatory Visit (INDEPENDENT_AMBULATORY_CARE_PROVIDER_SITE_OTHER): Payer: Medicare Other | Admitting: Cardiovascular Disease

## 2021-06-13 VITALS — BP 138/84 | HR 64 | Ht 60.0 in | Wt 133.2 lb

## 2021-06-13 DIAGNOSIS — I480 Paroxysmal atrial fibrillation: Secondary | ICD-10-CM

## 2021-06-13 DIAGNOSIS — I25119 Atherosclerotic heart disease of native coronary artery with unspecified angina pectoris: Secondary | ICD-10-CM | POA: Diagnosis not present

## 2021-06-13 DIAGNOSIS — E7800 Pure hypercholesterolemia, unspecified: Secondary | ICD-10-CM | POA: Diagnosis not present

## 2021-06-13 DIAGNOSIS — Z79899 Other long term (current) drug therapy: Secondary | ICD-10-CM | POA: Diagnosis not present

## 2021-06-13 LAB — BASIC METABOLIC PANEL
BUN/Creatinine Ratio: 14 (ref 12–28)
BUN: 12 mg/dL (ref 8–27)
CO2: 24 mmol/L (ref 20–29)
Calcium: 9.1 mg/dL (ref 8.7–10.3)
Chloride: 97 mmol/L (ref 96–106)
Creatinine, Ser: 0.83 mg/dL (ref 0.57–1.00)
Glucose: 91 mg/dL (ref 70–99)
Potassium: 4 mmol/L (ref 3.5–5.2)
Sodium: 135 mmol/L (ref 134–144)
eGFR: 74 mL/min/{1.73_m2} (ref >59)

## 2021-06-13 LAB — LIPID PANEL
Chol/HDL Ratio: 3.5 ratio (ref 0.0–4.4)
Cholesterol, Total: 178 mg/dL (ref 100–199)
HDL: 51 mg/dL (ref >39)
LDL Chol Calc (NIH): 96 mg/dL (ref 0–99)
Triglycerides: 183 mg/dL — ABNORMAL HIGH (ref 0–149)
VLDL Cholesterol Cal: 31 mg/dL (ref 5–40)

## 2021-06-13 LAB — ALT: ALT: 18 IU/L (ref 0–32)

## 2021-06-13 MED ORDER — SPIRONOLACTONE 25 MG PO TABS: ORAL_TABLET | ORAL | 0 refills | Status: DC

## 2021-06-13 MED ORDER — PROPRANOLOL HCL 10 MG PO TABS: 10.0000 mg | ORAL_TABLET | Freq: Four times a day (QID) | ORAL | 3 refills | Status: DC | PRN

## 2021-06-13 NOTE — Patient Instructions (Signed)
Medication Instructions:  Your physician recommends that you continue on your current medications as directed. Please refer to the Current Medication list given to you today.  *If you need a refill on your cardiac medications before your next appointment, please call your pharmacy*   Lab Work: LIPID, ALT, BMET  If you have labs (blood work) drawn today and your tests are completely normal, you will receive your results only by: [place] (if you have MyChart) OR A paper copy in the mail If you have any lab test that is abnormal or we need to change your treatment, we will call you to review the results.   Testing/Procedures: NONE   Follow-Up: At [facility], you and your health needs are our priority.  As part of our continuing mission to provide you with exceptional heart care, we have created designated Provider Care Teams.  These Care Teams include your primary Cardiologist (physician) and Advanced Practice Providers (APPs -  Physician Assistants and Nurse Practitioners) who all work together to provide you with the care you need, when you need it.  We recommend signing up for the patient portal called "MyChart".  Sign up information is provided on this After Visit Summary.  MyChart is used to connect with patients for Virtual Visits (Telemedicine).  Patients are able to view lab/test results, encounter notes, upcoming appointments, etc.  Non-urgent messages can be sent to your provider as well.   To learn more about what you can do with MyChart, go to [email or web address].    Your next appointment:   1 year(s)  The format for your next appointment:   In Person  Provider:   [name], MD OR app     Other Instructions

## 2021-06-17 DIAGNOSIS — Z1231 Encounter for screening mammogram for malignant neoplasm of breast: Secondary | ICD-10-CM | POA: Diagnosis not present

## 2021-07-04 ENCOUNTER — Ambulatory Visit (AMBULATORY_SURGERY_CENTER): Payer: Medicare Other | Admitting: Gastroenterology

## 2021-07-04 ENCOUNTER — Encounter: Payer: Self-pay | Admitting: Gastroenterology

## 2021-07-04 VITALS — BP 135/77 | HR 55 | Temp 96.6°F | Resp 13 | Ht 60.0 in | Wt 133.0 lb

## 2021-07-04 DIAGNOSIS — D123 Benign neoplasm of transverse colon: Secondary | ICD-10-CM

## 2021-07-04 DIAGNOSIS — D122 Benign neoplasm of ascending colon: Secondary | ICD-10-CM

## 2021-07-04 DIAGNOSIS — D120 Benign neoplasm of cecum: Secondary | ICD-10-CM | POA: Diagnosis not present

## 2021-07-04 DIAGNOSIS — Z86010 Personal history of colonic polyps: Secondary | ICD-10-CM | POA: Diagnosis not present

## 2021-07-04 HISTORY — PX: COLONOSCOPY WITH PROPOFOL: SHX5780

## 2021-07-04 MED ORDER — SODIUM CHLORIDE 0.9 % IV SOLN: 500.0000 mL | Freq: Once | INTRAVENOUS | Status: DC

## 2021-07-04 NOTE — Progress Notes (Signed)
Pt's states no medical or surgical changes since previsit or office visit.  VS CW  

## 2021-07-04 NOTE — Progress Notes (Signed)
See 06/13/2021 H&P, no changes.

## 2021-07-04 NOTE — Op Note (Signed)
[facility] Patient Name: Shannon Cummings Procedure Date: 07/04/2021 11:51 AM MRN: [id number] Endoscopist: Ladene Artist , MD Age: 76 Referring MD:  Date of Birth: 1946-02-10 Gender: Female Account #: [id number] Procedure:                Colonoscopy Indications:              Surveillance: Personal history of adenomatous                            polyps on last colonoscopy > 5 years ago Medicines:                Monitored Anesthesia Care Procedure:                Pre-Anesthesia Assessment:                           - Prior to the procedure, a History and Physical                            was performed, and patient medications and                            allergies were reviewed. The patient's tolerance of                            previous anesthesia was also reviewed. The risks                            and benefits of the procedure and the sedation                            options and risks were discussed with the patient.                            All questions were answered, and informed consent                            was obtained. Prior Anticoagulants: The patient has                            taken Eliquis (apixaban), last dose was 2 days                            prior to procedure. ASA Grade Assessment: III - A                            patient with severe systemic disease. After                            reviewing the risks and benefits, the patient was                            deemed in satisfactory condition to undergo the  procedure.                           After obtaining informed consent, the colonoscope                            was passed under direct vision. Throughout the                            procedure, the patient's blood pressure, pulse, and                            oxygen saturations were monitored continuously. The                            CF [id number] #[id number] was introduced through the anus                             and advanced to the the cecum, identified by                            appendiceal orifice and ileocecal valve. The                            ileocecal valve, appendiceal orifice, and rectum                            were photographed. The quality of the bowel                            preparation was excellent. The colonoscopy was                            performed without difficulty. The patient tolerated                            the procedure well. Scope In: 12:01:29 PM Scope Out: 12:16:45 PM Scope Withdrawal Time: 0 hours 12 minutes 32 seconds  Total Procedure Duration: 0 hours 15 minutes 16 seconds  Findings:                 The perianal and digital rectal examinations were                            normal.                           A 4 mm polyp was found in the cecum. The polyp was                            sessile. The polyp was removed with a cold biopsy                            forceps. Resection and retrieval were complete.  Four sessile polyps were found in the transverse                            colon (3) and ascending colon (1). The polyps were                            5 to 8 mm in size. These polyps were removed with a                            cold snare. Resection and retrieval were complete.                           The ileocecal valve was moderately lipomatous.                           A few small-mouthed diverticula were found in the                            sigmoid colon, descending colon and ascending                            colon. There was no evidence of diverticular                            bleeding.                           Internal hemorrhoids were found during                            retroflexion. The hemorrhoids were moderate and                            Grade I (internal hemorrhoids that do not prolapse).                           The exam was otherwise without abnormality on                             direct and retroflexion views. Complications:            No immediate complications. Estimated blood loss:                            None. Estimated Blood Loss:     Estimated blood loss: none. Impression:               - One 4 mm polyp in the cecum, removed with a cold                            biopsy forceps. Resected and retrieved.                           - Four 5 to 8 mm polyps in the transverse colon and  in the ascending colon, removed with a cold snare.                            Resected and retrieved.                           - Lipomatous IC valve.                           - Mild diverticulosis in the sigmoid colon, in the                            descending colon and in the ascending colon.                           - Internal hemorrhoids.                           - The examination was otherwise normal on direct                            and retroflexion views. Recommendation:           - Consider repeat colonoscopy vs no colonoscopy due                            to age after studies are complete for surveillance                            based on pathology results.                           - Patient has a contact number available for                            emergencies. The signs and symptoms of potential                            delayed complications were discussed with the                            patient. Return to normal activities tomorrow.                            Written discharge instructions were provided to the                            patient.                           - High fiber diet.                           - Continue present medications.                           - Await  pathology results.                           - Resume Eliquis (apixaban) at prior dose in 2                            days. Refer to managing physician for further                            adjustment of  therapy. Ladene Artist, MD 07/04/2021 12:21:46 PM This report has been signed electronically.

## 2021-07-04 NOTE — Progress Notes (Signed)
Called to room to assist during endoscopic procedure.  Patient ID and intended procedure confirmed with present staff. Received instructions for my participation in the procedure from the performing physician.  

## 2021-07-04 NOTE — Patient Instructions (Signed)

## 2021-07-04 NOTE — Progress Notes (Signed)
To pacu, VSS. Report to rn.tb °

## 2021-07-06 ENCOUNTER — Telehealth: Payer: Self-pay

## 2021-07-06 NOTE — Telephone Encounter (Signed)
°  Follow up Call-  Call back number 07/04/2021  Post procedure Call Back phone  # [phone]  Permission to leave phone message Yes  Some recent data might be hidden     Patient questions:  Do you have a fever, pain , or abdominal swelling? No. Pain Score  0 *  Have you tolerated food without any problems? Yes.    Have you been able to return to your normal activities? Yes.    Do you have any questions about your discharge instructions: Diet   No. Medications  No. Follow up visit  No.  Do you have questions or concerns about your Care? No.  Actions: * If pain score is 4 or above: No action needed, pain <4.

## 2021-07-13 DIAGNOSIS — L718 Other rosacea: Secondary | ICD-10-CM | POA: Diagnosis not present

## 2021-07-13 DIAGNOSIS — L570 Actinic keratosis: Secondary | ICD-10-CM | POA: Diagnosis not present

## 2021-07-15 ENCOUNTER — Encounter: Payer: Self-pay | Admitting: Gastroenterology

## 2021-07-15 DIAGNOSIS — Z20822 Contact with and (suspected) exposure to covid-19: Secondary | ICD-10-CM | POA: Diagnosis not present

## 2021-07-18 ENCOUNTER — Encounter: Payer: Self-pay | Admitting: Cardiovascular Disease

## 2021-07-19 MED ORDER — SPIRONOLACTONE 25 MG PO TABS: ORAL_TABLET | ORAL | 3 refills | Status: DC

## 2021-07-29 ENCOUNTER — Other Ambulatory Visit: Payer: Self-pay | Admitting: Pharmacist

## 2021-07-29 MED ORDER — PRALUENT 75 MG/ML ~~LOC~~ SOAJ: 75.0000 mg | SUBCUTANEOUS | 3 refills | Status: DC

## 2021-08-04 ENCOUNTER — Telehealth: Payer: Self-pay | Admitting: *Deleted

## 2021-08-04 NOTE — Chronic Care Management (AMB) (Signed)
?  Chronic Care Management  ? ?Outreach Note ? ?08/04/2021 ?Name: Shannon Cummings MRN: [id number] DOB: Oct 14, 1945 ? ?Shannon Cummings is a 76 y.o. year old female who is a primary care patient of Birdie Riddle, Aundra Millet, MD. I reached out to Shannon Cummings by phone today in response to a referral sent by [name]'s primary care provider. ? ?An unsuccessful telephone outreach was attempted today. The patient was referred to the case management team for assistance with care management and care coordination.  ? ?Follow Up Plan: A HIPAA compliant phone message was left for the patient providing contact information and requesting a return call.  ?The care management team will reach out to the patient again over the next 7 days.  ?If patient returns call to provider office, please advise to call [facility]* at [phone].* ? ?[name]  ?Care Guide, Embedded Care Coordination ?[facility]  Care Management  ?Direct Dial: [phone] ? ?

## 2021-08-05 NOTE — Chronic Care Management (AMB) (Signed)
?  Chronic Care Management  ? ?Note ? ?08/05/2021 ?Name: Shannon Cummings MRN: [id number] DOB: 09-22-1945 ? ?Shannon Cummings is a 76 y.o. year old female who is a primary care patient of Birdie Riddle, Aundra Millet, MD. I reached out to [name] by phone today in response to a referral sent by [name] PCP. ? ?[name] was given information about Chronic Care Management services today including:  ?CCM service includes personalized support from designated clinical staff supervised by her physician, including individualized plan of care and coordination with other care providers ?24/7 contact phone numbers for assistance for urgent and routine care needs. ?Service will only be billed when office clinical staff spend 20 minutes or more in a month to coordinate care. ?Only one practitioner may furnish and bill the service in a calendar month. ?The patient may stop CCM services at any time (effective at the end of the month) by phone call to the office staff. ?The patient is responsible for co-pay (up to 20% after annual deductible is met) if co-pay is required by the individual health plan.  ? ?Patient agreed to services and verbal consent obtained.  ? ?Follow up plan: ?Telephone appointment with care management team member scheduled for:08/17/21 ? ?[name]  ?Care Guide, Embedded Care Coordination ?[facility]  Care Management  ?Direct Dial: [phone] ? ?

## 2021-08-17 ENCOUNTER — Telehealth: Payer: Medicare Other

## 2021-08-24 ENCOUNTER — Other Ambulatory Visit: Payer: Self-pay | Admitting: Internal Medicine

## 2021-08-24 ENCOUNTER — Other Ambulatory Visit: Payer: Self-pay | Admitting: Cardiovascular Disease

## 2021-08-24 DIAGNOSIS — I480 Paroxysmal atrial fibrillation: Secondary | ICD-10-CM

## 2021-08-24 NOTE — Telephone Encounter (Signed)
Prescription refill request for Eliquis received. ?Indication: Afib  ?Last office visit: 06/13/21 ([place]) ?Scr: 0.83 (06/13/21) ?Age: 76 ?Weight: 60.3kg ? ?Appropriate dose and refill sent to requested pharmacy.  ?

## 2021-09-07 ENCOUNTER — Encounter (HOSPITAL_COMMUNITY): Payer: Self-pay | Admitting: Nurse Practitioner

## 2021-09-07 ENCOUNTER — Ambulatory Visit (HOSPITAL_COMMUNITY)
Admission: RE | Admit: 2021-09-07 | Discharge: 2021-09-07 | Disposition: A | Discharge status: Home / Self Care | Payer: Medicare Other | Source: Ambulatory Visit | Attending: Nurse Practitioner | Admitting: Nurse Practitioner

## 2021-09-07 VITALS — BP 150/102 | HR 63 | Ht 60.0 in | Wt 134.2 lb

## 2021-09-07 DIAGNOSIS — D6869 Other thrombophilia: Secondary | ICD-10-CM

## 2021-09-07 DIAGNOSIS — I480 Paroxysmal atrial fibrillation: Secondary | ICD-10-CM

## 2021-09-07 DIAGNOSIS — I10 Essential (primary) hypertension: Secondary | ICD-10-CM | POA: Insufficient documentation

## 2021-09-07 DIAGNOSIS — I2510 Atherosclerotic heart disease of native coronary artery without angina pectoris: Secondary | ICD-10-CM | POA: Insufficient documentation

## 2021-09-07 DIAGNOSIS — Z7901 Long term (current) use of anticoagulants: Secondary | ICD-10-CM | POA: Insufficient documentation

## 2021-09-07 DIAGNOSIS — Z79899 Other long term (current) drug therapy: Secondary | ICD-10-CM | POA: Diagnosis not present

## 2021-09-07 DIAGNOSIS — I4891 Unspecified atrial fibrillation: Secondary | ICD-10-CM | POA: Insufficient documentation

## 2021-09-07 MED ORDER — SPIRONOLACTONE 25 MG PO TABS: ORAL_TABLET | ORAL | Status: DC

## 2021-09-07 NOTE — Progress Notes (Signed)
? ?Primary Care Physician: [name], MD ?Referring Physician: Dr. Rayann Heman  ? ? ?Shannon Cummings is a 76 y.o. female with a h/o CAD, HTN, paroxysmal afib, that is in the afib clinic for f/u ablation in 07/2020.  Has felt a few flutters but no sustained afib. Enjoying SR. No issues with eliquis with a CHA2DS2VASc of 4.  ? ?Today, she denies symptoms of palpitations, chest pain, shortness of breath, orthopnea, PND, lower extremity edema, dizziness, presyncope, syncope, or neurologic sequela. The patient is tolerating medications without difficulties and is otherwise without complaint today.  ? ?Past Medical History:  ?Diagnosis Date  ? Arthritis   ? CAD (coronary artery disease)   ? a. prior stenting history. b. NSTEMI s/p DES to prox LAD with EF 45% by cath 08/14/12. c. Mild trop elevation shortly after cath ?mild plaque embolization - patent stent on relook. // d. Myoview 8/18: EF 60, normal perfusion; Low Risk. e. NSTEMI 06/2018- patent prior LAD stent, 80% OM1 s/p DES, normal LVEDP, EF 45-50%, moderate residual disease treated medically.  ? Fatty infiltration of liver   ? GERD (gastroesophageal reflux disease)   ? Hyperlipidemia   ? Hypertension   ? Hypokalemia   ? Hypotension   ? Myocardial infarction [facility]) 2014  ? OSA on CPAP   ? Osteoporosis   ? PAF (paroxysmal atrial fibrillation) ([place])   ? On rythmol previously  ? Pulmonary nodule   ? a. 5m subpleural nodular density CT 08/2012, instructed to f/u pulm MD.  ? Sinus bradycardia   ? Tubular adenoma of colon 2007  ? ?Past Surgical History:  ?Procedure Laterality Date  ? ATRIAL FIBRILLATION ABLATION N/A 07/27/2020  ? Procedure: ATRIAL FIBRILLATION ABLATION;  Surgeon: AThompson Grayer MD;  [facility];  Service: Cardiovascular;  Laterality: N/A;  ? COLONOSCOPY  03/07/2016  ? COLONOSCOPY WITH PROPOFOL  07/04/2021  ? CORONARY ANGIOPLASTY WITH STENT PLACEMENT  08/14/2012  ? LAD  Dr MSherren Mocha ? CORONARY STENT INTERVENTION N/A 06/14/2018  ?  Procedure: CORONARY STENT INTERVENTION;  Surgeon: VJettie Booze MD;  [facility];  Service: Cardiovascular;  Laterality: N/A;  ? CORONARY STENT PLACEMENT  2000  ? hair restoration  02/2017  ? LEFT HEART CATH AND CORONARY ANGIOGRAPHY N/A 06/14/2018  ? Procedure: LEFT HEART CATH AND CORONARY ANGIOGRAPHY;  Surgeon: VJettie Booze MD;  [facility];  Service: Cardiovascular;  Laterality: N/A;  ? LEFT HEART CATH AND CORONARY ANGIOGRAPHY N/A 05/14/2019  ? Procedure: LEFT HEART CATH AND CORONARY ANGIOGRAPHY;  Surgeon: CSherren Mocha MD;  [facility];  Service: Cardiovascular;  Laterality: N/A;  ? LEFT HEART CATHETERIZATION WITH CORONARY ANGIOGRAM N/A 08/14/2012  ? Procedure: LEFT HEART CATHETERIZATION WITH CORONARY ANGIOGRAM;  Surgeon: MSherren Mocha MD;  [facility];  Service: Cardiovascular;  Laterality: N/A;  ? LEFT HEART CATHETERIZATION WITH CORONARY ANGIOGRAM N/A 08/19/2012  ? Procedure: LEFT HEART CATHETERIZATION WITH CORONARY ANGIOGRAM;  Surgeon: MSherren Mocha MD;  [facility];  Service: Cardiovascular;  Laterality: N/A;  ? ROTATOR CUFF REPAIR    ? TONSILLECTOMY    ? TOTAL ABDOMINAL HYSTERECTOMY    ? ? ?Current Outpatient Medications  ?Medication Sig Dispense Refill  ? acetaminophen (TYLENOL) 650 MG CR tablet Take 650 mg by mouth every 8 (eight) hours as needed for pain.    ? Alirocumab (PRALUENT) 75 MG/ML SOAJ Inject 75 mg into the skin every 14 (fourteen) days. 6 mL 3  ?  Azelaic Acid 15 % gel Apply topically daily.    ? Bempedoic Acid (NEXLETOL) 180 MG TABS Take 1 tablet by mouth daily. 90 tablet 2  ? Cholecalciferol (VITAMIN D3) 50 MCG (2000 UT) capsule Take 2,000 Units by mouth daily.    ? ELIQUIS 5 MG TABS tablet TAKE 1 TABLET(5 MG) BY MOUTH TWICE DAILY 180 tablet 1  ? fluticasone (FLONASE) 50 MCG/ACT nasal spray Place 2 sprays into both nostrils daily as needed for allergies or rhinitis. 48 g 1  ? losartan (COZAAR) 25 MG  tablet TAKE 2 TABLETS(50 MG) BY MOUTH DAILY 180 tablet 3  ? nitroGLYCERIN (NITROSTAT) 0.4 MG SL tablet DISSOLVE ONE TABLET UNDER TONGUE AS NEEDED FOR CHEST PAIN FOR UP TO 3 DOSES 25 tablet 3  ? pantoprazole (PROTONIX) 40 MG tablet TAKE 1 TABLET(40 MG) BY MOUTH DAILY 90 tablet 1  ? Polyethyl Glycol-Propyl Glycol (SYSTANE) 0.4-0.3 % SOLN Place 1 drop into both eyes 3 (three) times daily.    ? potassium chloride (KLOR-CON) 10 MEQ tablet TAKE 1 TABLET(10 MEQ) BY MOUTH DAILY 90 tablet 3  ? propranolol (INDERAL) 10 MG tablet Take 1 tablet (10 mg total) by mouth 4 (four) times daily as needed (for A Fib Attacks). 360 tablet 3  ? saccharomyces boulardii (FLORASTOR) 250 MG capsule Take 1 capsule (250 mg total) by mouth 2 (two) times daily. (Patient taking differently: Take 250 mg by mouth every other day.) 90 capsule 0  ? spironolactone (ALDACTONE) 25 MG tablet TAKE 1 TABLET BY MOUTH EVERY OTHER DAY    ? ?No current facility-administered medications for this encounter.  ? ? ?Allergies  ?Allergen Reactions  ? Effexor [Venlafaxine] Other (See Comments)  ?  CAUSED TOO MUCH SEDATION; CANNOT TOLERATE  ? Flomax [Tamsulosin] Other (See Comments)  ?  Caused rapid HYPOTENSION- patient came very close to fainting (also caused weakness and vertigo)  ? Isosorbide   ?  Headache  ? Macrobid [Nitrofurantoin] Diarrhea, Nausea Only and Other (See Comments)  ?  Severe abd pain   ? Other Anxiety and Other (See Comments)  ?  Intolerance to strong pain medications=  Make her feel anxious and feel like coming out of her skin  ? Statins Other (See Comments)  ?  Restless legs, bad feeling.  This has occurred with Crestor 5 mg once weekly, Lipitor 10 mg twice weekly, and Simvastatin daily  ? Zetia [Ezetimibe] Other (See Comments)  ?  Made the legs ache  ? ? ?Social History  ? ?Socioeconomic History  ? Marital status: Married  ?  Spouse name: Not on file  ? Number of children: 2  ? Years of education: Not on file  ? Highest education level: Not on  file  ?Occupational History  ? Occupation: Retired  ?  Employer: [employer]   ?  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 Smoking status: Never  ? Smokeless tobacco: Never  ?Vaping Use  ? Vaping Use: Never used  ?Substance and Sexual Activity  ? Alcohol use: Yes  ?  Alcohol/week: 1.0 standard drink  ?  Types: 1 Glasses of wine per week  ?  Comment: 2 drinks daily   ? Drug use: No  ? Sexual activity: Not Currently  ?Other Topics Concern  ? Not on file  ?Social History Narrative  ? Daily caffeine   ? Lives in [place]  ? 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 ?Social Determinants of Health  ? ?Financial  Resource Strain: Not on file  ?Food Insecurity: Not on file  ?Transportation Needs: Not on file  ?Physical Activity: Not on file  ?Stress: Not on file  ?Social Connections: Not on file  ?Intimate Partner Violence: Not on file  ? ? ?Family History  ?Problem Relation Age of Onset  ? Heart disease Father   ? Alzheimer's disease Mother   ? Breast cancer Maternal Aunt   ? Colon cancer Neg Hx   ? ? ?ROS- All systems are reviewed and negative except as per the HPI above ? ?Physical Exam: ?Vitals:  ? 09/07/21 1010  ?BP: (!) 150/102  ?Pulse: 63  ?Weight: 60.9 kg  ?Height: 5' (1.524 m)  ? ?Wt Readings from Last 3 Encounters:  ?09/07/21 60.9 kg  ?07/04/21 60.3 kg  ?06/13/21 60.4 kg  ? ? ?Labs: ?Lab Results  ?Component Value Date  ? NA 135 06/13/2021  ? K 4.0 06/13/2021  ? CL 97 06/13/2021  ? CO2 24 06/13/2021  ? GLUCOSE 91 06/13/2021  ? BUN 12 06/13/2021  ? CREATININE 0.83 06/13/2021  ? CALCIUM 9.1 06/13/2021  ? MG 2.0 08/17/2012  ? ?Lab Results  ?Component Value Date  ? INR 1.13 06/13/2018  ? ?Lab Results  ?Component Value Date  ? CHOL 178 06/13/2021  ? HDL 51 06/13/2021  ? [place] 96 06/13/2021  ? TRIG 183 (H) 06/13/2021  ? ? ? ?GEN- The patient is well appearing, alert and oriented x 3 today.   ?Head- normocephalic, atraumatic ?Eyes-  Sclera clear, conjunctiva pink ?Ears- hearing intact ?Oropharynx-  clear ?Neck- supple, no JVP ?Lymph- no cervical lymphadenopathy ?Lungs- Clear to ausculation bilaterally, normal work of breathing ?Heart- Regular rate and rhythm, no murmurs, rubs or gallops, PMI not l

## 2021-10-18 ENCOUNTER — Telehealth: Payer: Self-pay

## 2021-10-18 ENCOUNTER — Ambulatory Visit (INDEPENDENT_AMBULATORY_CARE_PROVIDER_SITE_OTHER): Payer: Medicare Other | Admitting: Family Medicine

## 2021-10-18 ENCOUNTER — Encounter: Payer: Self-pay | Admitting: Family Medicine

## 2021-10-18 VITALS — BP 124/80 | HR 62 | Temp 97.1°F | Resp 16 | Ht 61.0 in | Wt 133.8 lb

## 2021-10-18 DIAGNOSIS — D649 Anemia, unspecified: Secondary | ICD-10-CM | POA: Diagnosis not present

## 2021-10-18 DIAGNOSIS — I25119 Atherosclerotic heart disease of native coronary artery with unspecified angina pectoris: Secondary | ICD-10-CM

## 2021-10-18 DIAGNOSIS — R29898 Other symptoms and signs involving the musculoskeletal system: Secondary | ICD-10-CM

## 2021-10-18 DIAGNOSIS — R202 Paresthesia of skin: Secondary | ICD-10-CM | POA: Diagnosis not present

## 2021-10-18 DIAGNOSIS — M65331 Trigger finger, right middle finger: Secondary | ICD-10-CM

## 2021-10-18 DIAGNOSIS — R200 Anesthesia of skin: Secondary | ICD-10-CM

## 2021-10-18 DIAGNOSIS — G629 Polyneuropathy, unspecified: Secondary | ICD-10-CM | POA: Diagnosis not present

## 2021-10-18 LAB — CBC WITH DIFFERENTIAL/PLATELET
Basophils Absolute: 0 10*3/uL (ref 0.0–0.1)
Basophils Relative: 0.8 % (ref 0.0–3.0)
Eosinophils Absolute: 0.1 10*3/uL (ref 0.0–0.7)
Eosinophils Relative: 1.8 % (ref 0.0–5.0)
HCT: 41.2 % (ref 36.0–46.0)
Hemoglobin: 13.6 g/dL (ref 12.0–15.0)
Lymphocytes Relative: 41.3 % (ref 12.0–46.0)
Lymphs Abs: 1.9 10*3/uL (ref 0.7–4.0)
MCHC: 33.1 g/dL (ref 30.0–36.0)
MCV: 90.4 fl (ref 78.0–100.0)
Monocytes Absolute: 0.4 10*3/uL (ref 0.1–1.0)
Monocytes Relative: 7.9 % (ref 3.0–12.0)
Neutro Abs: 2.2 10*3/uL (ref 1.4–7.7)
Neutrophils Relative %: 48.2 % (ref 43.0–77.0)
Platelets: 212 10*3/uL (ref 150.0–400.0)
RBC: 4.56 Mil/uL (ref 3.87–5.11)
RDW: 13.6 % (ref 11.5–15.5)
WBC: 4.6 10*3/uL (ref 4.0–10.5)

## 2021-10-18 LAB — BASIC METABOLIC PANEL
BUN: 17 mg/dL (ref 6–23)
CO2: 27 mEq/L (ref 19–32)
Calcium: 9.3 mg/dL (ref 8.4–10.5)
Chloride: 98 mEq/L (ref 96–112)
Creatinine, Ser: 0.83 mg/dL (ref 0.40–1.20)
GFR: 69.02 mL/min (ref >60.00)
Glucose, Bld: 80 mg/dL (ref 70–99)
Potassium: 4.3 mEq/L (ref 3.5–5.1)
Sodium: 135 mEq/L (ref 135–145)

## 2021-10-18 LAB — TSH: TSH: 0.43 u[IU]/mL (ref 0.35–5.50)

## 2021-10-18 LAB — HEPATIC FUNCTION PANEL
ALT: 19 U/L (ref 0–35)
AST: 24 U/L (ref 0–37)
Albumin: 4.5 g/dL (ref 3.5–5.2)
Alkaline Phosphatase: 81 U/L (ref 39–117)
Bilirubin, Direct: 0.1 mg/dL (ref 0.0–0.3)
Total Bilirubin: 0.5 mg/dL (ref 0.2–1.2)
Total Protein: 6.9 g/dL (ref 6.0–8.3)

## 2021-10-18 LAB — B12 AND FOLATE PANEL
Folate: 7.8 ng/mL (ref >5.9)
Vitamin B-12: 210 pg/mL — ABNORMAL LOW (ref 211–911)

## 2021-10-18 NOTE — Telephone Encounter (Signed)
Patient is scheduled for today.  ? ?Nurse Assessment ?Nurse: Lavina Hamman, RN, Thomasena Edis Date/Time Eilene Ghazi Time): 10/17/2021 3:38:49 PM ?Confirm and document reason for call. If symptomatic, describe symptoms. ?---Caller states numbness in both feet and both hands started a few months ago. ? ?Does the patient have any new or worsening symptoms? ---Yes ?Will a triage be completed? ---Yes ?Related visit to physician within the last 2 weeks? ---No ?Does the PT have any chronic conditions? (i.e. diabetes, asthma, this includes High risk factors for pregnancy, etc.) ---Yes ?List chronic conditions. ---HTN, Previous MI ?Is this a behavioral health or substance abuse call? ---No ? ?Neurologic Deficit ?[1] Numbness or tingling in one or both hands  ?[2] is a chronic symptom (recurrent or ongoing AND present > 4 weeks) ?Lavina Hamman, RN, Ethan 10/17/2021 3:39:09 PM ? ?10/17/2021 3:45:10 PM SEE PCP WITHIN 3 DAYS Yes Lavina Hamman, RN, Thomasena Edis ?

## 2021-10-18 NOTE — Patient Instructions (Signed)
Follow up as needed or as scheduled ?We'll notify you of your lab results and make any changes if needed ?We'll call you to schedule your Neuro and Hand appts ?Try Tylenol Arthritis for hand pain/stiffness ?Call with any questions or concerns ?Hang in there!!! ?

## 2021-10-18 NOTE — Progress Notes (Signed)
? ?  Subjective:  ? ? Patient ID: Shannon Cummings, female    DOB: Aug 05, 1945, 76 y.o.   MRN: [id number] ? ?HPI ?Numbness- pt reports sxs started 'several months' ago.  Occurs in both hands but R>L.  Numbness is mostly in the finger tips.  Sxs are more pronounced when reading or on her phone.  Sxs occur in fingers more than thumb.  Also has a trigger finger on R (middle finger).  Pt has decreased ROM and grip strength in R hand.   Now having sxs in feet as well- from ball of foot to toes.  Toes will start tingling if sitting in a chair or with legs crossed.  Occurs w/ walking ? ? ?Review of Systems ?For ROS see HPI  ?   ?Objective:  ? Physical Exam ?Vitals reviewed.  ?Constitutional:   ?   General: She is not in acute distress. ?   Appearance: Normal appearance. She is not ill-appearing.  ?HENT:  ?   Head: Normocephalic and atraumatic.  ?Eyes:  ?   Extraocular Movements: Extraocular movements intact.  ?   Conjunctiva/sclera: Conjunctivae normal.  ?   Pupils: Pupils are equal, round, and reactive to light.  ?Musculoskeletal:  ?   Cervical back: Neck supple. No rigidity.  ?   Right lower leg: No edema.  ?   Left lower leg: No edema.  ?   Comments: Decreased finger flexion of hands bilaterally.  Triggering of R middle finger  ?Skin: ?   General: Skin is warm and dry.  ?Neurological:  ?   General: No focal deficit present.  ?   Mental Status: She is alert and oriented to person, place, and time.  ?   Cranial Nerves: No cranial nerve deficit.  ?   Motor: No weakness.  ?   Gait: Gait normal.  ?   Deep Tendon Reflexes: Reflexes normal.  ?Psychiatric:     ?   Mood and Affect: Mood normal.     ?   Behavior: Behavior normal.     ?   Thought Content: Thought content normal.  ? ? ? ? ? ?   ?Assessment & Plan:  ? ?Numbness/tingling- new.  Occurring in bilateral hands and feet.  Sxs started several months ago but have been becoming more bothersome.  Hand sxs are more prominent in certain positions- reading in bed or on her phone.   R>L.  Tinging in feet can be positional- sitting in a particular chair or crossing legs, but can also occur w/ walking.  Will check labs to r/o underlying metabolic cause for numbness/tingling such as B12 deficiency, anemia, thyroid.  Refer to Neurology for complete evaluation.  Pt expressed understanding and is in agreement w/ plan.  ? ?Trigger finger- new.  R middle finger.  Refer to hand specialist ? ?Decreased grip strength of R hand- new.  Suspect this is due to impaired ROM caused by arthritic change.  Refer to hand specialist. ?

## 2021-10-19 ENCOUNTER — Telehealth: Payer: Self-pay | Admitting: Pharmacist

## 2021-10-19 ENCOUNTER — Telehealth: Payer: Self-pay

## 2021-10-19 LAB — IRON,TIBC AND FERRITIN PANEL
%SAT: 20 % (calc) (ref 16–45)
Ferritin: 24 ng/mL (ref 16–288)
Iron: 87 ug/dL (ref 45–160)
TIBC: 435 mcg/dL (calc) (ref 250–450)

## 2021-10-19 NOTE — Telephone Encounter (Signed)
Spoke w/ pt and advised pt of lab results  

## 2021-10-19 NOTE — Telephone Encounter (Signed)
-----   Message from [name], MD sent at 10/18/2021  6:13 PM EDT ----- ?Labs look great w/ exception of low B12.  I would recommend weekly B12 injections x4 weeks and then monthly x6 months.  This may be the cause of your numbness and tingling. ?

## 2021-10-19 NOTE — Telephone Encounter (Signed)
Pt called clinic asking to reactivate her [employer] for her Praluent and Nexletol. Advised her the grand is currently closed for funding, but will let her know once it's reopened and can reapply then. ?

## 2021-10-20 ENCOUNTER — Ambulatory Visit (INDEPENDENT_AMBULATORY_CARE_PROVIDER_SITE_OTHER): Payer: Medicare Other | Admitting: Orthopedic Surgery

## 2021-10-20 ENCOUNTER — Encounter: Payer: Self-pay | Admitting: Orthopedic Surgery

## 2021-10-20 DIAGNOSIS — M65331 Trigger finger, right middle finger: Secondary | ICD-10-CM | POA: Diagnosis not present

## 2021-10-20 MED ORDER — BETAMETHASONE SOD PHOS & ACET 6 (3-3) MG/ML IJ SUSP
6.0000 mg | INTRAMUSCULAR | Status: AC | PRN
  Administered 2021-10-20: 6 mg via INTRA_ARTICULAR

## 2021-10-20 MED ORDER — LIDOCAINE HCL 1 % IJ SOLN
1.0000 mL | INTRAMUSCULAR | Status: AC | PRN
  Administered 2021-10-20: 1 mL

## 2021-10-20 NOTE — Progress Notes (Signed)
Office Visit Note   Patient: Shannon Cummings           Date of Birth: 16-Jan-1946           MRN: [id number] Visit Date: 10/20/2021              Requested by: [name], MD [address] [place],  [place] [address] PCP: [name], MD   Assessment & Plan: Visit Diagnoses:  1. Trigger finger, right middle finger     Plan: Patient has a trigger finger involving the right middle finger.  She also has numbness and tingling involving bilateral hands and bilateral feet.  She has been seen for this issue by her primary care provider who diagnosed her with a B12 deficiency.  She is starting B12 replacement therapy in the near future.  Discussed with patient that if her hand numbness and paresthesias fail to improve following B12 therapy then we will further investigate for possible nerve entrapment syndrome.  We reviewed the nature of trigger finger as well as its diagnosis, prognosis, and both conservative and surgical treatment options.  After our discussion, she would like to proceed with a corticosteroid injection into the right middle finger A1 pulley.  I can see her back in 6 to 8 weeks if she remains symptomatic.  Follow-Up Instructions: No follow-ups on file.   Orders:  No orders of the defined types were placed in this encounter.  No orders of the defined types were placed in this encounter.     Procedures: Hand/UE Inj: R long A1 for trigger finger on 10/20/2021 1:59 PM Indications: tendon swelling and therapeutic Details: 25 G needle, volar approach Medications: 1 mL lidocaine 1 %; 6 mg betamethasone acetate-betamethasone sodium phosphate 6 (3-3) MG/ML Procedure, treatment alternatives, risks and benefits explained, specific risks discussed. Consent was given by the patient. Immediately prior to procedure a time out was called to verify the correct patient, procedure, equipment, support staff and site/side marked as required. Patient was prepped and draped in  the usual sterile fashion.      Clinical Data: No additional findings.   Subjective: Chief Complaint  Patient presents with   Right Middle Finger - New Patient (Initial Visit)    Is a 76 year old right-hand-dominant female presents with painful triggering of the right middle finger.  This been going on for for 5 months.  He notes that the middle finger will become locked in a flexed position and require her to use her contralateral hand to unlock it.  This has become quite bothersome for her.  She denies triggering of other fingers.  She also describes numbness and tingling involving both hands and both feet.  She is been seen by her primary care provider who is concerned about a B12 deficiency.  She is starting B12 replacement therapy in the near future.   Review of Systems   Objective: Vital Signs: There were no vitals taken for this visit.  Physical Exam Constitutional:      Appearance: Normal appearance.  Cardiovascular:     Rate and Rhythm: Normal rate.     Pulses: Normal pulses.  Pulmonary:     Effort: Pulmonary effort is normal.  Skin:    General: Skin is warm and dry.     Capillary Refill: Capillary refill takes less than 2 seconds.  Neurological:     Mental Status: She is alert.    Right Hand Exam   Tenderness  Right hand tenderness location:  TTP over A1 pulley w/ mild swelling.  Other  Erythema: absent Sensation: normal Pulse: present  Comments:  Palpable and visible triggering of the middle finger.      Specialty Comments:  No specialty comments available.  Imaging: No results found.   PMFS History: Patient Active Problem List   Diagnosis Date Noted   Trigger finger, right middle finger 10/20/2021   H/O Clostridium difficile infection 05/20/2020   Diarrhea of infectious origin 04/09/2020   Abdominal pain 04/09/2020   Nausea 04/09/2020   Enteritis due to Clostridium difficile 04/09/2020   COVID-19 05/13/2019   Unstable angina (HCC)  05/13/2019   Sinus bradycardia 06/15/2018   Ischemic cardiomyopathy 06/15/2018   NSTEMI (non-ST elevated myocardial infarction) ([place]) 06/13/2018   GERD (gastroesophageal reflux disease) 07/10/2016   Hx of colonic polyp 02/25/2016   Antiplatelet or antithrombotic long-term use 02/25/2016   Nodule of right lung 10/13/2012   History of non-ST elevation myocardial infarction (NSTEMI) 08/14/2012   Hepatic steatosis 12/03/2011   Paroxysmal atrial fibrillation ([place]) 12/22/2010   CAD (coronary artery disease) 12/22/2010   INSOMNIA 04/13/2010   Hyperlipidemia 03/19/2010   Essential hypertension 03/19/2010   Obstructive sleep apnea 03/17/2010   Past Medical History:  Diagnosis Date   Arthritis    CAD (coronary artery disease)    a. prior stenting history. b. NSTEMI s/p DES to prox LAD with EF 45% by cath 08/14/12. c. Mild trop elevation shortly after cath ?mild plaque embolization - patent stent on relook. // d. Myoview 8/18: EF 60, normal perfusion; Low Risk. e. NSTEMI 06/2018- patent prior LAD stent, 80% OM1 s/p DES, normal LVEDP, EF 45-50%, moderate residual disease treated medically.   Fatty infiltration of liver    GERD (gastroesophageal reflux disease)    Hyperlipidemia    Hypertension    Hypokalemia    Hypotension    Myocardial infarction ([place]) 2014   OSA on CPAP    Osteoporosis    PAF (paroxysmal atrial fibrillation) ([place])    On rythmol previously   Pulmonary nodule    a. 48m subpleural nodular density CT 08/2012, instructed to f/u pulm MD.   Sinus bradycardia    Tubular adenoma of colon 2007    Family History  Problem Relation Age of Onset   Heart disease Father    Alzheimer's disease Mother    Breast cancer Maternal Aunt    Colon cancer Neg Hx     Past Surgical History:  Procedure Laterality Date   ATRIAL FIBRILLATION ABLATION N/A 07/27/2020   Procedure: ATRIAL FIBRILLATION ABLATION;  Surgeon: AThompson Grayer MD;  [facility];  Service: Cardiovascular;   Laterality: N/A;   COLONOSCOPY  03/07/2016   COLONOSCOPY WITH PROPOFOL  07/04/2021   CORONARY ANGIOPLASTY WITH STENT PLACEMENT  08/14/2012   LAD  Dr MSherren Mocha  CORONARY STENT INTERVENTION N/A 06/14/2018   Procedure: CORONARY STENT INTERVENTION;  Surgeon: VJettie Booze MD;  [facility];  Service: Cardiovascular;  Laterality: N/A;   CORONARY STENT PLACEMENT  2000   hair restoration  02/2017   LEFT HEART CATH AND CORONARY ANGIOGRAPHY N/A 06/14/2018   Procedure: LEFT HEART CATH AND CORONARY ANGIOGRAPHY;  Surgeon: VJettie Booze MD;  [facility];  Service: Cardiovascular;  Laterality: N/A;   LEFT HEART CATH AND CORONARY ANGIOGRAPHY N/A 05/14/2019   Procedure: LEFT HEART CATH AND CORONARY ANGIOGRAPHY;  Surgeon: CSherren Mocha MD;  [facility];  Service: Cardiovascular;  Laterality: N/A;  LEFT HEART CATHETERIZATION WITH CORONARY ANGIOGRAM N/A 08/14/2012   Procedure: LEFT HEART CATHETERIZATION WITH CORONARY ANGIOGRAM;  Surgeon: [name], MD;  Location: [facility];  Service: Cardiovascular;  Laterality: N/A;   LEFT HEART CATHETERIZATION WITH CORONARY ANGIOGRAM N/A 08/19/2012   Procedure: LEFT HEART CATHETERIZATION WITH CORONARY ANGIOGRAM;  Surgeon: [name], MD;  [facility];  Service: Cardiovascular;  Laterality: N/A;   ROTATOR CUFF REPAIR     TONSILLECTOMY     TOTAL ABDOMINAL HYSTERECTOMY     Social History   Occupational History   Occupation: Retired    Fish farm manager: [employer]     Comment: 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  Tobacco Use   Smoking status: Never   Smokeless tobacco: Never  Vaping Use   Vaping Use: Never used  Substance and Sexual Activity   Alcohol use: Yes    Alcohol/week: 1.0 standard drink    Types: 1 Glasses of wine per week    Comment: 2 drinks daily    Drug use: No   Sexual activity: Not Currently

## 2021-10-24 ENCOUNTER — Ambulatory Visit (INDEPENDENT_AMBULATORY_CARE_PROVIDER_SITE_OTHER): Payer: Medicare Other | Admitting: Family Medicine

## 2021-10-24 DIAGNOSIS — E538 Deficiency of other specified B group vitamins: Secondary | ICD-10-CM

## 2021-10-24 MED ORDER — CYANOCOBALAMIN 1000 MCG/ML IJ SOLN
1000.0000 ug | Freq: Once | INTRAMUSCULAR | Status: AC
  Administered 2021-10-24: 1000 ug via INTRAMUSCULAR

## 2021-10-24 NOTE — Progress Notes (Signed)
Patient was in office to receive her B12 shot in left deltoid. Patient tolerated the medication well.

## 2021-11-01 ENCOUNTER — Ambulatory Visit (INDEPENDENT_AMBULATORY_CARE_PROVIDER_SITE_OTHER): Payer: Medicare Other | Admitting: Family Medicine

## 2021-11-01 DIAGNOSIS — E538 Deficiency of other specified B group vitamins: Secondary | ICD-10-CM

## 2021-11-01 MED ORDER — CYANOCOBALAMIN 1000 MCG/ML IJ SOLN
1000.0000 ug | Freq: Once | INTRAMUSCULAR | Status: AC
  Administered 2021-11-01: 1000 ug via INTRAMUSCULAR

## 2021-11-01 NOTE — Progress Notes (Signed)
Shannon Cummings is a 76 y.o. female presents to the office today for b12 injections, per physician's orders.  [name]

## 2021-11-04 ENCOUNTER — Telehealth: Payer: Self-pay | Admitting: Family Medicine

## 2021-11-04 NOTE — Telephone Encounter (Signed)
Pt came in stating that she is having a reaction from the B-12 shot. She states she got it on 11/02/21, she states that she had dizziness, headache, stomach pain, and fatigue. She states that she just started feeling up getting up and moving around today. She wanted to know if this is normal? Should she continue the shots?    Please advise   Pt can be reached at the cell #

## 2021-11-04 NOTE — Telephone Encounter (Signed)
Pt stated she has had some possible side effects from B12 -please advise should she continue receiving shots or discontinue?

## 2021-11-04 NOTE — Telephone Encounter (Signed)
This is a very unusual reaction to B12 shots.  She can try OTC B12 supplements and see if this is better for her.  We can hold on the shots for now.

## 2021-11-07 NOTE — Telephone Encounter (Signed)
Spoke w/ pt and advised her that she can try the OTC supplements and see how that works . We can hold the injections for now . Pt expressed verbal understanding

## 2021-11-08 ENCOUNTER — Ambulatory Visit: Payer: Medicare Other

## 2021-11-16 ENCOUNTER — Ambulatory Visit: Payer: Medicare Other

## 2021-11-22 ENCOUNTER — Other Ambulatory Visit: Payer: Self-pay

## 2021-11-22 DIAGNOSIS — K219 Gastro-esophageal reflux disease without esophagitis: Secondary | ICD-10-CM

## 2021-11-22 MED ORDER — PANTOPRAZOLE SODIUM 40 MG PO TBEC: DELAYED_RELEASE_TABLET | ORAL | 1 refills | Status: DC

## 2021-12-08 ENCOUNTER — Encounter: Payer: Self-pay | Admitting: Neurology

## 2021-12-08 ENCOUNTER — Ambulatory Visit (INDEPENDENT_AMBULATORY_CARE_PROVIDER_SITE_OTHER): Payer: Medicare Other | Admitting: Neurology

## 2021-12-08 VITALS — BP 134/86 | HR 63 | Ht 60.5 in | Wt 134.0 lb

## 2021-12-08 DIAGNOSIS — R202 Paresthesia of skin: Secondary | ICD-10-CM | POA: Diagnosis not present

## 2021-12-08 DIAGNOSIS — I25119 Atherosclerotic heart disease of native coronary artery with unspecified angina pectoris: Secondary | ICD-10-CM | POA: Diagnosis not present

## 2021-12-08 DIAGNOSIS — E538 Deficiency of other specified B group vitamins: Secondary | ICD-10-CM | POA: Insufficient documentation

## 2021-12-08 NOTE — Progress Notes (Signed)
Chief Complaint  Patient presents with   New Patient (Initial Visit)    Rm 17. Alone. NP/Internal referral for bilateral hand and foot numbness.      ASSESSMENT AND PLAN  KELYSE PASK is a 76 y.o. female   Intermittent bilateral lower extremity and hands paresthesia  Fairly normal neurological examinations,  In the setting of B12 deficiency,  Laboratory evaluation to rule out other treatable etiology for peripheral neuropathy  Bilateral hands paresthesia can be due to superficial nerve such as median or ulnar nerve compression with elbow and wrist flexion, suggested ACE bandage as needed, avoiding excessive prolonged flexion of the wrist and elbow    DIAGNOSTIC DATA (LABS, IMAGING, TESTING) - I reviewed patient records, labs, notes, testing and imaging myself where available.   MEDICAL HISTORY:  Shannon Cummings is a 76 year old female, seen in request by her primary care doctor [name], for evaluation of bilateral feet paresthesia, initial evaluation was on December 08, 2021.  I reviewed and summarized the referring note.  Past medical history Hypertension Coronary artery disease Paroxysmal atrial fibrillation on Eliquis GERD Obstructive sleep apnea on CPAP  She does tend to sleep folding her hands, since 2022, she noticed intermittent bilateral hands numbness, has to shake her hands to make sensation come back  In addition, she began to notice bilateral foot discomfort, denies significant pain, but if she stands on her feet for too long, such as walking for 2 miles, she felt foot swelling, aching sensation, has to take off her shoes, if she is resting for a while, symptoms would go away, she denies significant gait abnormality, no persistent sensory loss  She was found to have decreased B12 level at the beginning of 2023, reported level was 210, received the B12 intramuscular shot, complains of headache, now taking p.o. supplement, she has been on Protonix for  many years, denies significant acid reflux now  She has intermittent low back pain, denies gait abnormality, denies bowel or bladder incontinence  PHYSICAL EXAM:   Vitals:   12/08/21 1002  BP: 134/86  Pulse: 63  Weight: 134 lb (60.8 kg)  Height: 5' 0.5" (1.537 m)   Not recorded     Body mass index is 25.74 kg/m.  PHYSICAL EXAMNIATION:  Gen: NAD, conversant, well nourised, well groomed                     Cardiovascular: Regular rate rhythm, no peripheral edema, warm, nontender. Eyes: Conjunctivae clear without exudates or hemorrhage Neck: Supple, no carotid bruits. Pulmonary: Clear to auscultation bilaterally   NEUROLOGICAL EXAM:  MENTAL STATUS: Speech/cognition: Awake, alert, oriented to history taking and casual conversation CRANIAL NERVES: CN II: Visual fields are full to confrontation. Pupils are round equal and briskly reactive to light. CN III, IV, VI: extraocular movement are normal. No ptosis. CN V: Facial sensation is intact to light touch CN VII: Face is symmetric with normal eye closure  CN VIII: Hearing is normal to causal conversation. CN IX, X: Phonation is normal. CN XI: Head turning and shoulder shrug are intact  MOTOR: There is no pronator drift of out-stretched arms. Muscle bulk and tone are normal. Muscle strength is normal.  REFLEXES: Reflexes are 2+ and symmetric at the biceps, triceps, knees, and trace at ankles. Plantar responses are flexor.  SENSORY: Intact to light touch, pinprick and vibratory sensation are intact in fingers and toes.  COORDINATION: There is no trunk or limb dysmetria noted.  GAIT/STANCE: Posture  is normal. Gait is steady   REVIEW OF SYSTEMS:  Full 14 system review of systems performed and notable only for as above All other review of systems were negative.   ALLERGIES: Allergies  Allergen Reactions   Effexor [Venlafaxine] Other (See Comments)    CAUSED TOO MUCH SEDATION; CANNOT TOLERATE   Flomax  [Tamsulosin] Other (See Comments)    Caused rapid HYPOTENSION- patient came very close to fainting (also caused weakness and vertigo)   Isosorbide     Headache   Macrobid [Nitrofurantoin] Diarrhea, Nausea Only and Other (See Comments)    Severe abd pain    Other Anxiety and Other (See Comments)    Intolerance to strong pain medications=  Make her feel anxious and feel like coming out of her skin   B-12 Compliance Injection [Cyanocobalamin] Other (See Comments)    headache   Statins Other (See Comments)    Restless legs, bad feeling.  This has occurred with Crestor 5 mg once weekly, Lipitor 10 mg twice weekly, and Simvastatin daily   Zetia [Ezetimibe] Other (See Comments)    Made the legs ache    HOME MEDICATIONS: Current Outpatient Medications  Medication Sig Dispense Refill   acetaminophen (TYLENOL) 650 MG CR tablet Take 650 mg by mouth every 8 (eight) hours as needed for pain.     Alirocumab (PRALUENT) 75 MG/ML SOAJ Inject 75 mg into the skin every 14 (fourteen) days. 6 mL 3   Azelaic Acid 15 % gel Apply topically daily.     Bempedoic Acid (NEXLETOL) 180 MG TABS Take 1 tablet by mouth daily. 90 tablet 2   Cholecalciferol (VITAMIN D3) 50 MCG (2000 UT) capsule Take 2,000 Units by mouth daily.     ELIQUIS 5 MG TABS tablet TAKE 1 TABLET(5 MG) BY MOUTH TWICE DAILY 180 tablet 1   fluticasone (FLONASE) 50 MCG/ACT nasal spray Place 2 sprays into both nostrils daily as needed for allergies or rhinitis. 48 g 1   losartan (COZAAR) 25 MG tablet TAKE 2 TABLETS(50 MG) BY MOUTH DAILY 180 tablet 3   nitroGLYCERIN (NITROSTAT) 0.4 MG SL tablet DISSOLVE ONE TABLET UNDER TONGUE AS NEEDED FOR CHEST PAIN FOR UP TO 3 DOSES 25 tablet 3   pantoprazole (PROTONIX) 40 MG tablet Take one tablet daily 90 tablet 1   Polyethyl Glycol-Propyl Glycol (SYSTANE) 0.4-0.3 % SOLN Place 1 drop into both eyes 3 (three) times daily.     potassium chloride (KLOR-CON) 10 MEQ tablet TAKE 1 TABLET(10 MEQ) BY MOUTH DAILY 90 tablet  3   propranolol (INDERAL) 10 MG tablet Take 1 tablet (10 mg total) by mouth 4 (four) times daily as needed (for A Fib Attacks). 360 tablet 3   saccharomyces boulardii (FLORASTOR) 250 MG capsule Take 1 capsule (250 mg total) by mouth 2 (two) times daily. (Patient taking differently: Take 250 mg by mouth every other day.) 90 capsule 0   spironolactone (ALDACTONE) 25 MG tablet TAKE 1 TABLET BY MOUTH EVERY OTHER DAY     No current facility-administered medications for this visit.    PAST MEDICAL HISTORY: Past Medical History:  Diagnosis Date   Arthritis    CAD (coronary artery disease)    a. prior stenting history. b. NSTEMI s/p DES to prox LAD with EF 45% by cath 08/14/12. c. Mild trop elevation shortly after cath ?mild plaque embolization - patent stent on relook. // d. Myoview 8/18: EF 60, normal perfusion; Low Risk. e. NSTEMI 06/2018- patent prior LAD stent, 80% OM1 s/p DES,  normal LVEDP, EF 45-50%, moderate residual disease treated medically.   Fatty infiltration of liver    GERD (gastroesophageal reflux disease)    Hyperlipidemia    Hypertension    Hypokalemia    Hypotension    Myocardial infarction ([place]) 2014   OSA on CPAP    Osteoporosis    PAF (paroxysmal atrial fibrillation) ([place])    On rythmol previously   Pulmonary nodule    a. 14m subpleural nodular density CT 08/2012, instructed to f/u pulm MD.   Sinus bradycardia    Tubular adenoma of colon 2007    PAST SURGICAL HISTORY: Past Surgical History:  Procedure Laterality Date   ATRIAL FIBRILLATION ABLATION N/A 07/27/2020   Procedure: ATRIAL FIBRILLATION ABLATION;  Surgeon: AThompson Grayer MD;  [facility];  Service: Cardiovascular;  Laterality: N/A;   COLONOSCOPY  03/07/2016   COLONOSCOPY WITH PROPOFOL  07/04/2021   CORONARY ANGIOPLASTY WITH STENT PLACEMENT  08/14/2012   LAD  Dr MSherren Mocha  CORONARY STENT INTERVENTION N/A 06/14/2018   Procedure: CORONARY STENT INTERVENTION;  Surgeon: VJettie Booze MD;  [facility];  Service: Cardiovascular;  Laterality: N/A;   CORONARY STENT PLACEMENT  2000   hair restoration  02/2017   LEFT HEART CATH AND CORONARY ANGIOGRAPHY N/A 06/14/2018   Procedure: LEFT HEART CATH AND CORONARY ANGIOGRAPHY;  Surgeon: VJettie Booze MD;  [facility];  Service: Cardiovascular;  Laterality: N/A;   LEFT HEART CATH AND CORONARY ANGIOGRAPHY N/A 05/14/2019   Procedure: LEFT HEART CATH AND CORONARY ANGIOGRAPHY;  Surgeon: CSherren Mocha MD;  [facility];  Service: Cardiovascular;  Laterality: N/A;   LEFT HEART CATHETERIZATION WITH CORONARY ANGIOGRAM N/A 08/14/2012   Procedure: LEFT HEART CATHETERIZATION WITH CORONARY ANGIOGRAM;  Surgeon: MSherren Mocha MD;  [facility];  Service: Cardiovascular;  Laterality: N/A;   LEFT HEART CATHETERIZATION WITH CORONARY ANGIOGRAM N/A 08/19/2012   Procedure: LEFT HEART CATHETERIZATION WITH CORONARY ANGIOGRAM;  Surgeon: MSherren Mocha MD;  [facility];  Service: Cardiovascular;  Laterality: N/A;   ROTATOR CUFF REPAIR     TONSILLECTOMY     TOTAL ABDOMINAL HYSTERECTOMY      FAMILY HISTORY: Family History  Problem Relation Age of Onset   Heart disease Father    Alzheimer's disease Mother    Breast cancer Maternal Aunt    Colon cancer Neg Hx     SOCIAL HISTORY: Social History   Socioeconomic History   Marital status: Married    Spouse name: Not on file   Number of children: 2   Years of education: Not on file   Highest education level: Not on file  Occupational History   Occupation: Retired    EFish farm manager [employer]     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  Tobacco Use   Smoking status: Never   Smokeless tobacco: Never  Vaping Use   Vaping Use: Never used  Substance and Sexual Activity   Alcohol use: Yes    Alcohol/week: 1.0 standard drink of alcohol    Types: 1 Glasses of wine per week    Comment: 2 drinks daily    Drug use: No    Sexual activity: Not Currently  Other Topics Concern   Not on file  Social History Narrative   Daily caffeine    [place]  Retired dSocial workerfor an aSomervilleStrain:  Low Risk  (05/03/2020)   Overall Financial Resource Strain (CARDIA)    Difficulty of Paying Living Expenses: Not hard at all  Food Insecurity: No Food Insecurity (05/03/2020)   Hunger Vital Sign    Worried About Running Out of Food in the Last Year: Never true    Ran Out of Food in the Last Year: Never true  Transportation Needs: No Transportation Needs (05/03/2020)   PRAPARE - Hydrologist (Medical): No    Lack of Transportation (Non-Medical): No  Physical Activity: Sufficiently Active (05/03/2020)   Exercise Vital Sign    Days of Exercise per Week: 4 days    Minutes of Exercise per Session: 60 min  Stress: No Stress Concern Present (05/03/2020)   [place]    Feeling of Stress : Not at all  Social Connections: Moderately Isolated (05/03/2020)   Social Connection and Isolation Panel [NHANES]    Frequency of Communication with Friends and Family: More than three times a week    Frequency of Social Gatherings with Friends and Family: More than three times a week    Attends Religious Services: Never    Marine scientist or Organizations: No    Attends Archivist Meetings: Never    Marital Status: Married  Human resources officer Violence: Not At Risk (05/03/2020)   Humiliation, Afraid, Rape, and Kick questionnaire    Fear of Current or Ex-Partner: No    Emotionally Abused: No    Physically Abused: No    Sexually Abused: No      Marcial Pacas, M.D. Ph.D.  [facility] [address] [place], [place] [address] Ph: [phone] Fax: [phone]  CC:  [name], MD [address] [place],    [id number]  [name], MD \

## 2021-12-14 LAB — MULTIPLE MYELOMA PANEL, SERUM
Albumin SerPl Elph-Mcnc: 4 g/dL (ref 2.9–4.4)
Albumin/Glob SerPl: 1.4 (ref 0.7–1.7)
Alpha 1: 0.2 g/dL (ref 0.0–0.4)
Alpha2 Glob SerPl Elph-Mcnc: 0.9 g/dL (ref 0.4–1.0)
B-Globulin SerPl Elph-Mcnc: 1 g/dL (ref 0.7–1.3)
Gamma Glob SerPl Elph-Mcnc: 0.9 g/dL (ref 0.4–1.8)
Globulin, Total: 3 g/dL (ref 2.2–3.9)
IgA/Immunoglobulin A, Serum: 182 mg/dL (ref 64–422)
IgG (Immunoglobin G), Serum: 836 mg/dL (ref 586–1602)
IgM (Immunoglobulin M), Srm: 103 mg/dL (ref 26–217)
Total Protein: 7 g/dL (ref 6.0–8.5)

## 2021-12-14 LAB — HGB A1C W/O EAG: Hgb A1c MFr Bld: 5.6 % (ref 4.8–5.6)

## 2021-12-14 LAB — SEDIMENTATION RATE: Sed Rate: 3 mm/hr (ref 0–40)

## 2021-12-14 LAB — VITAMIN B12: Vitamin B-12: 726 pg/mL (ref 232–1245)

## 2021-12-14 LAB — METHYLMALONIC ACID, SERUM: Methylmalonic Acid: 112 nmol/L (ref 0–378)

## 2021-12-14 LAB — C-REACTIVE PROTEIN: CRP: <1 mg/L (ref 0–10)

## 2021-12-14 LAB — RPR: RPR Ser Ql: NONREACTIVE

## 2021-12-14 LAB — ANA W/REFLEX: Anti Nuclear Antibody (ANA): NEGATIVE

## 2021-12-30 ENCOUNTER — Telehealth: Payer: Self-pay | Admitting: Family Medicine

## 2021-12-30 NOTE — Telephone Encounter (Signed)
Left message for patient to call back and schedule Medicare Annual Wellness Visit (AWV).   Please offer to do virtually or by telephone.  Left office number and my jabber #[phone].  Last AWV:05/03/2020  Please schedule at anytime with Nurse Health Advisor.   

## 2022-01-16 DIAGNOSIS — L821 Other seborrheic keratosis: Secondary | ICD-10-CM | POA: Diagnosis not present

## 2022-01-16 DIAGNOSIS — L718 Other rosacea: Secondary | ICD-10-CM | POA: Diagnosis not present

## 2022-01-16 DIAGNOSIS — D1801 Hemangioma of skin and subcutaneous tissue: Secondary | ICD-10-CM | POA: Diagnosis not present

## 2022-01-16 DIAGNOSIS — L298 Other pruritus: Secondary | ICD-10-CM | POA: Diagnosis not present

## 2022-01-16 DIAGNOSIS — H43813 Vitreous degeneration, bilateral: Secondary | ICD-10-CM | POA: Diagnosis not present

## 2022-01-16 DIAGNOSIS — L570 Actinic keratosis: Secondary | ICD-10-CM | POA: Diagnosis not present

## 2022-01-20 ENCOUNTER — Telehealth: Payer: Self-pay | Admitting: Family Medicine

## 2022-01-20 NOTE — Telephone Encounter (Signed)
Left message for patient to call back and schedule Medicare Annual Wellness Visit (AWV).   Please offer to do virtually or by telephone.  Left office number and my jabber #[phone].  Last AWV:05/03/2020  Please schedule at anytime with Nurse Health Advisor.   

## 2022-02-07 ENCOUNTER — Other Ambulatory Visit: Payer: Self-pay

## 2022-02-07 MED ORDER — NEXLETOL 180 MG PO TABS: 1.0000 | ORAL_TABLET | Freq: Every day | ORAL | 1 refills | Status: DC

## 2022-02-14 ENCOUNTER — Telehealth (INDEPENDENT_AMBULATORY_CARE_PROVIDER_SITE_OTHER): Payer: Medicare Other | Admitting: Family Medicine

## 2022-02-14 ENCOUNTER — Encounter: Payer: Self-pay | Admitting: Family Medicine

## 2022-02-14 VITALS — HR 60 | Ht 60.5 in | Wt 129.0 lb

## 2022-02-14 DIAGNOSIS — R059 Cough, unspecified: Secondary | ICD-10-CM

## 2022-02-14 DIAGNOSIS — I25119 Atherosclerotic heart disease of native coronary artery with unspecified angina pectoris: Secondary | ICD-10-CM | POA: Diagnosis not present

## 2022-02-14 MED ORDER — BENZONATATE 100 MG PO CAPS: ORAL_CAPSULE | ORAL | 0 refills | Status: DC

## 2022-02-14 MED ORDER — DOXYCYCLINE HYCLATE 100 MG PO TABS: 100.0000 mg | ORAL_TABLET | Freq: Two times a day (BID) | ORAL | 0 refills | Status: DC

## 2022-02-14 NOTE — Patient Instructions (Signed)
-  I sent the medication(s) we discussed to your pharmacy: Meds ordered this encounter  Medications   doxycycline (VIBRA-TABS) 100 MG tablet    Sig: Take 1 tablet (100 mg total) by mouth 2 (two) times daily.    Dispense:  14 tablet    Refill:  0   benzonatate (TESSALON PERLES) 100 MG capsule    Sig: 1-2 capsules up to twice daily as needed for cough    Dispense:  30 capsule    Refill:  0   I sent the message to the [place] office to request an appointment per your request.  I hope you are feeling better soon!  Seek in person care promptly if your symptoms worsen, new concerns arise or you are not improving with treatment.  It was nice to meet you today. I help [facility] out with telemedicine visits on Tuesdays and Thursdays and am happy to help if you need a virtual follow up visit on those days. Otherwise, if you have any concerns or questions following this visit please schedule a follow up visit with your Primary Care office or seek care at a local urgent care clinic to avoid delays in care. If you are having severe or life threatening symptoms please call 911 and/or go to the nearest emergency room.

## 2022-02-14 NOTE — Progress Notes (Signed)
Pt is scheduled °

## 2022-02-14 NOTE — Progress Notes (Signed)
Virtual Visit via Video Note  I connected with Airiel  on 02/14/22 at 12:40 PM EDT by a video enabled telemedicine application and verified that I am speaking with the correct person using two identifiers.  Location patient: [place] Location provider:work or home office Persons participating in the virtual visit: patient, provider  I discussed the limitations and requested verbal permission for telemedicine visit. The patient expressed understanding and agreed to proceed.   HPI:  Acute telemedicine visit for cough and congestion: -Onset: 1.5 weeks ago, getting worse -Symptoms include: congestion, cough, drainage, hoarseness, feels tired and drained, some chest tightness at times -sister was sick with the same and went to the doctor and tested neg x1 for covid, patient did not have a covid test -Denies:fever -Has tried:zycam, robitussin -Pertinent past medical history: see below -Pertinent medication allergies: Allergies  Allergen Reactions   Effexor [Venlafaxine] Other (See Comments)    CAUSED TOO MUCH SEDATION; CANNOT TOLERATE   Flomax [Tamsulosin] Other (See Comments)    Caused rapid HYPOTENSION- patient came very close to fainting (also caused weakness and vertigo)   Isosorbide     Headache   Macrobid [Nitrofurantoin] Diarrhea, Nausea Only and Other (See Comments)    Severe abd pain    Other Anxiety and Other (See Comments)    Intolerance to strong pain medications=  Make her feel anxious and feel like coming out of her skin   B-12 Compliance Injection [Cyanocobalamin] Other (See Comments)    headache   Statins Other (See Comments)    Restless legs, bad feeling.  This has occurred with Crestor 5 mg once weekly, Lipitor 10 mg twice weekly, and Simvastatin daily   Zetia [Ezetimibe] Other (See Comments)    Made the legs ache  -COVID-19 vaccine status:  Immunization History  Administered Date(s) Administered   Fluad Quad(high Dose 65+) 03/04/2019, 05/03/2020   Influenza Split  03/06/2011, 03/05/2012   Influenza Whole 04/06/2010   Influenza, High Dose Seasonal PF 01/04/2016, 04/10/2018   Influenza,inj,Quad PF,6+ Mos 03/26/2017   PFIZER Comirnaty(Gray Top)Covid-19 Tri-Sucrose Vaccine 11/04/2020   PFIZER(Purple Top)SARS-COV-2 Vaccination 07/15/2019, 08/12/2019   Pneumococcal Conjugate-13 07/10/2016   Pneumococcal Polysaccharide-23 08/15/2012   Zoster Recombinat (Shingrix) 07/13/2020, 07/14/2020   Zoster, Live 07/10/2012     ROS: See pertinent positives and negatives per HPI.  Past Medical History:  Diagnosis Date   Arthritis    CAD (coronary artery disease)    a. prior stenting history. b. NSTEMI s/p DES to prox LAD with EF 45% by cath 08/14/12. c. Mild trop elevation shortly after cath ?mild plaque embolization - patent stent on relook. // d. Myoview 8/18: EF 60, normal perfusion; Low Risk. e. NSTEMI 06/2018- patent prior LAD stent, 80% OM1 s/p DES, normal LVEDP, EF 45-50%, moderate residual disease treated medically.   Fatty infiltration of liver    GERD (gastroesophageal reflux disease)    Hyperlipidemia    Hypertension    Hypokalemia    Hypotension    Myocardial infarction ([place]) 2014   OSA on CPAP    Osteoporosis    PAF (paroxysmal atrial fibrillation) ([place])    On rythmol previously   Pulmonary nodule    a. 63m subpleural nodular density CT 08/2012, instructed to f/u pulm MD.   Sinus bradycardia    Tubular adenoma of colon 2007    Past Surgical History:  Procedure Laterality Date   ATRIAL FIBRILLATION ABLATION N/A 07/27/2020   Procedure: ATRIAL FIBRILLATION ABLATION;  Surgeon: AThompson Grayer MD;  [facility];  Service: Cardiovascular;  Laterality: N/A;   COLONOSCOPY  03/07/2016   COLONOSCOPY WITH PROPOFOL  07/04/2021   CORONARY ANGIOPLASTY WITH STENT PLACEMENT  08/14/2012   LAD  Dr [name]   CORONARY STENT INTERVENTION N/A 06/14/2018   Procedure: CORONARY STENT INTERVENTION;  Surgeon: Jettie Booze, MD;  Location:  [facility];  Service: Cardiovascular;  Laterality: N/A;   CORONARY STENT PLACEMENT  2000   hair restoration  02/2017   LEFT HEART CATH AND CORONARY ANGIOGRAPHY N/A 06/14/2018   Procedure: LEFT HEART CATH AND CORONARY ANGIOGRAPHY;  Surgeon: Jettie Booze, MD;  Location: [facility];  Service: Cardiovascular;  Laterality: N/A;   LEFT HEART CATH AND CORONARY ANGIOGRAPHY N/A 05/14/2019   Procedure: LEFT HEART CATH AND CORONARY ANGIOGRAPHY;  Surgeon: [name], MD;  Location: [facility];  Service: Cardiovascular;  Laterality: N/A;   LEFT HEART CATHETERIZATION WITH CORONARY ANGIOGRAM N/A 08/14/2012   Procedure: LEFT HEART CATHETERIZATION WITH CORONARY ANGIOGRAM;  Surgeon: [name], MD;  [facility]: [facility];  Service: Cardiovascular;  Laterality: N/A;   LEFT HEART CATHETERIZATION WITH CORONARY ANGIOGRAM N/A 08/19/2012   Procedure: LEFT HEART CATHETERIZATION WITH CORONARY ANGIOGRAM;  Surgeon: [name], MD;  Location: [facility];  Service: Cardiovascular;  Laterality: N/A;   ROTATOR CUFF REPAIR     TONSILLECTOMY     TOTAL ABDOMINAL HYSTERECTOMY       Current Outpatient Medications:    acetaminophen (TYLENOL) 650 MG CR tablet, Take 650 mg by mouth every 8 (eight) hours as needed for pain., Disp: , Rfl:    Alirocumab (PRALUENT) 75 MG/ML SOAJ, Inject 75 mg into the skin every 14 (fourteen) days., Disp: 6 mL, Rfl: 3   Azelaic Acid 15 % gel, Apply topically daily., Disp: , Rfl:    Bempedoic Acid (NEXLETOL) 180 MG TABS, Take 1 tablet by mouth daily., Disp: 90 tablet, Rfl: 1   benzonatate (TESSALON PERLES) 100 MG capsule, 1-2 capsules up to twice daily as needed for cough, Disp: 30 capsule, Rfl: 0   Cholecalciferol (VITAMIN D3) 50 MCG (2000 UT) capsule, Take 2,000 Units by mouth daily., Disp: , Rfl:    doxycycline (VIBRA-TABS) 100 MG tablet, Take 1 tablet (100 mg total) by mouth 2 (two) times daily., Disp: 14 tablet, Rfl: 0   ELIQUIS 5 MG TABS tablet, TAKE  1 TABLET(5 MG) BY MOUTH TWICE DAILY, Disp: 180 tablet, Rfl: 1   fluticasone (FLONASE) 50 MCG/ACT nasal spray, Place 2 sprays into both nostrils daily as needed for allergies or rhinitis., Disp: 48 g, Rfl: 1   losartan (COZAAR) 25 MG tablet, TAKE 2 TABLETS(50 MG) BY MOUTH DAILY, Disp: 180 tablet, Rfl: 3   nitroGLYCERIN (NITROSTAT) 0.4 MG SL tablet, DISSOLVE ONE TABLET UNDER TONGUE AS NEEDED FOR CHEST PAIN FOR UP TO 3 DOSES, Disp: 25 tablet, Rfl: 3   Polyethyl Glycol-Propyl Glycol (SYSTANE) 0.4-0.3 % SOLN, Place 1 drop into both eyes 3 (three) times daily., Disp: , Rfl:    potassium chloride (KLOR-CON) 10 MEQ tablet, TAKE 1 TABLET(10 MEQ) BY MOUTH DAILY, Disp: 90 tablet, Rfl: 3   propranolol (INDERAL) 10 MG tablet, Take 1 tablet (10 mg total) by mouth 4 (four) times daily as needed (for A Fib Attacks)., Disp: 360 tablet, Rfl: 3   spironolactone (ALDACTONE) 25 MG tablet, TAKE 1 TABLET BY MOUTH EVERY OTHER DAY, Disp: , Rfl:    saccharomyces boulardii (FLORASTOR) 250 MG capsule, Take 1 capsule (250 mg total) by mouth 2 (two) times  daily. (Patient not taking: Reported on 02/14/2022), Disp: 90 capsule, Rfl: 0  EXAM:  VITALS per patient if applicable: O2 76%, P 60 - she checks theses while I am on the visit with her  GENERAL: alert, oriented, appears well and in no acute distress  HEENT: atraumatic, conjunttiva clear, no obvious abnormalities on inspection of external nose and ears  NECK: normal movements of the head and neck  LUNGS: on inspection no signs of respiratory distress, breathing rate appears normal, no obvious gross SOB, gasping or wheezing, occasional coughing during visit  CV: no obvious cyanosis  MS: moves all visible extremities without noticeable abnormality  PSYCH/NEURO: pleasant and cooperative, no obvious depression or anxiety, speech and thought processing grossly intact  ASSESSMENT AND PLAN:  Discussed the following assessment and plan:  Cough, unspecified type  -we  discussed possible serious and likely etiologies, options for evaluation and workup, limitations of telemedicine visit vs in person visit, treatment, treatment risks and precautions. Pt is agreeable to treatment via telemedicine at this moment. Discussed possibility viral resp  illness, covid, CAP or 2ndary bact resp infectionvs other. She would prefer to be seen inperson in the next few days for follow up if possible for lung exam and possible xray but she is concerned can't get visit with her PCP office and prefers not to go to urgent care.  I offered to send message to [place] to see what is available - she prefers her office, [facility] or [place]. Also sent tessalon for cough and doxy for in the interim if any worsening or not improving.  Scheduled follow up with PCP offered: Sent message to schedulers to assist and advised patient to contact PCP office to schedule if does not receive call back in next 24 hours. Advised to seek prompt virtual visit or in person care if worsening, new symptoms arise, or if is not improving with treatment as expected per our conversation of expected course. Discussed options for follow up care. Did let this patient know that I do telemedicine on Tuesdays and Thursdays for [place] and those are the days I am logged into the system. Advised to schedule follow up visit with PCP, [place] virtual visits or [facility] if any further questions or concerns to avoid delays in care.   I discussed the assessment and treatment plan with the patient. The patient was provided an opportunity to ask questions and all were answered. The patient agreed with the plan and demonstrated an understanding of the instructions.     Lucretia Kern, DO

## 2022-02-15 DIAGNOSIS — H04123 Dry eye syndrome of bilateral lacrimal glands: Secondary | ICD-10-CM | POA: Diagnosis not present

## 2022-02-15 DIAGNOSIS — H43811 Vitreous degeneration, right eye: Secondary | ICD-10-CM | POA: Diagnosis not present

## 2022-02-15 DIAGNOSIS — H4423 Degenerative myopia, bilateral: Secondary | ICD-10-CM | POA: Diagnosis not present

## 2022-02-15 DIAGNOSIS — H43812 Vitreous degeneration, left eye: Secondary | ICD-10-CM | POA: Diagnosis not present

## 2022-02-16 ENCOUNTER — Encounter: Payer: Self-pay | Admitting: Family Medicine

## 2022-02-16 ENCOUNTER — Ambulatory Visit (INDEPENDENT_AMBULATORY_CARE_PROVIDER_SITE_OTHER): Payer: Medicare Other | Admitting: Family Medicine

## 2022-02-16 VITALS — BP 126/80 | HR 55 | Temp 97.9°F | Resp 16 | Ht 60.5 in | Wt 131.4 lb

## 2022-02-16 DIAGNOSIS — R051 Acute cough: Secondary | ICD-10-CM | POA: Diagnosis not present

## 2022-02-16 DIAGNOSIS — I25119 Atherosclerotic heart disease of native coronary artery with unspecified angina pectoris: Secondary | ICD-10-CM

## 2022-02-16 LAB — POC COVID19 BINAXNOW: SARS Coronavirus 2 Ag: NEGATIVE

## 2022-02-16 NOTE — Patient Instructions (Signed)
Follow up as needed or as scheduled Thankfully the COVID test is negative Continue the Doxycycline twice daily- take w/ food Cough meds as needed Drink LOTS of fluids and get LOTS of rest! Call with any questions or concerns Feel Better!!!

## 2022-02-16 NOTE — Progress Notes (Signed)
   Subjective:    Patient ID: Shannon Cummings, female    DOB: 13-Apr-1946, 76 y.o.   MRN: [id number]  HPI Cough- pt had video visit on 9/12 and was started on Doxycycline and Tessalon.  Pt reports sxs started ~2 weeks ago while visiting sister in [place].  Sister also got sick.  Pt reports lingering cough and ongoing fatigue.  'it feels just like when I had COVID in 2020'.  Has not had COVID test.  SOB is improving.  No fevers.  Denies body aches.  + nasal congestion, denies sinus pain.  Cough is intermittently productive of 'sorta green' sputum.     Review of Systems For ROS see HPI     Objective:   Physical Exam Vitals reviewed.  Constitutional:      General: She is not in acute distress.    Appearance: Normal appearance. She is well-developed. She is not ill-appearing.  HENT:     Head: Normocephalic and atraumatic.     Nose: No congestion or rhinorrhea.     Comments: No TTP over frontal or maxillary sinuses Eyes:     Conjunctiva/sclera: Conjunctivae normal.     Pupils: Pupils are equal, round, and reactive to light.  Cardiovascular:     Rate and Rhythm: Normal rate and regular rhythm.     Heart sounds: Normal heart sounds. No murmur heard. Pulmonary:     Effort: Pulmonary effort is normal. No respiratory distress.     Breath sounds: Normal breath sounds. No wheezing.  Musculoskeletal:     Cervical back: Normal range of motion and neck supple.  Lymphadenopathy:     Cervical: No cervical adenopathy.  Skin:    General: Skin is warm and dry.  Neurological:     General: No focal deficit present.     Mental Status: She is alert and oriented to person, place, and time.  Psychiatric:        Mood and Affect: Mood normal.        Behavior: Behavior normal.        Thought Content: Thought content normal.           Assessment & Plan:   Cough- new.  Pt reports sxs are improving but she continues to have lingering cough and fatigue.  She has only been on abx x24 hrs.  COVID test  (-).  Encouraged rest, fluids, and taking Doxy as directed.  Reviewed supportive care and red flags that should prompt return.  Pt expressed understanding and is in agreement w/ plan.

## 2022-02-20 ENCOUNTER — Other Ambulatory Visit: Payer: Self-pay

## 2022-02-20 DIAGNOSIS — I480 Paroxysmal atrial fibrillation: Secondary | ICD-10-CM

## 2022-02-20 MED ORDER — APIXABAN 5 MG PO TABS: ORAL_TABLET | ORAL | 1 refills | Status: DC

## 2022-02-20 NOTE — Telephone Encounter (Signed)
Prescription refill request for Eliquis received. Indication:Afib Last office visit:4/23 Scr:0.8 Age: 76 Weight:59.6 kg  Prescription refilled

## 2022-03-15 ENCOUNTER — Ambulatory Visit (INDEPENDENT_AMBULATORY_CARE_PROVIDER_SITE_OTHER): Payer: Medicare Other

## 2022-03-15 VITALS — Ht 61.0 in | Wt 130.0 lb

## 2022-03-15 DIAGNOSIS — Z0000 Encounter for general adult medical examination without abnormal findings: Secondary | ICD-10-CM | POA: Diagnosis not present

## 2022-03-15 DIAGNOSIS — Z780 Asymptomatic menopausal state: Secondary | ICD-10-CM | POA: Diagnosis not present

## 2022-03-15 NOTE — Progress Notes (Signed)
Subjective:   Shannon Cummings is a 76 y.o. female who presents for Medicare Annual (Subsequent) preventive examination. Virtual Visit via Telephone Note  I connected with  Shannon Cummings on 03/15/22 at 11:30 AM EDT by telephone and verified that I am speaking with the correct person using two identifiers.  Location: Patient: home  Provider: [place]  Persons participating in the virtual visit: patient/Nurse Health Advisor   I discussed the limitations, risks, security and privacy concerns of performing an evaluation and management service by telephone and the availability of in person appointments. The patient expressed understanding and agreed to proceed.  Interactive audio and video telecommunications were attempted between this nurse and patient, however failed, due to patient having technical difficulties OR patient did not have access to video capability.  We continued and completed visit with audio only.  Some vital signs may be absent or patient reported.   Daphane Shepherd, LPN  Review of Systems     Cardiac Risk Factors include: advanced age (>53mn, >>46women);hypertension     Objective:    Today's Vitals   03/15/22 1123  Weight: 130 lb (59 kg)  Height: '5\' 1"'$  (1.549 m)   Body mass index is 24.56 kg/m.     03/15/2022   11:27 AM 10/29/2020    4:48 PM 07/27/2020    5:58 AM 05/03/2020   11:09 AM 05/13/2019    8:49 AM 03/26/2019    5:17 PM 03/26/2019    5:05 PM  Advanced Directives  Does Patient Have a Medical Advance Directive? Yes Yes Yes Yes No Yes Yes  Type of AParamedicof [place]West [place]Living will HNorth [place]Living will [place]uck[place]Living will [place]Living will  HGagetownLiving will H[place]baLiving will  Does patient want to make changes to medical advance directive?   No - Patient declined      Copy of [place]  Chart? No - copy requested  No - copy requested No - copy requested  No - copy requested   Would patient like information on creating a medical advance directive?     No - Patient declined      Current Medications (verified) Outpatient Encounter Medications as of 03/15/2022  Medication Sig   acetaminophen (TYLENOL) 650 MG CR tablet Take 650 mg by mouth every 8 (eight) hours as needed for pain.   Alirocumab (PRALUENT) 75 MG/ML SOAJ Inject 75 mg into the skin every 14 (fourteen) days.   apixaban (ELIQUIS) 5 MG TABS tablet TAKE 1 TABLET(5 MG) BY MOUTH TWICE DAILY   Azelaic Acid 15 % gel Apply topically daily.   Bempedoic Acid (NEXLETOL) 180 MG TABS Take 1 tablet by mouth daily.   benzonatate (TESSALON PERLES) 100 MG capsule 1-2 capsules up to twice daily as needed for cough   Cholecalciferol (VITAMIN D3) 50 MCG (2000 UT) capsule Take 2,000 Units by mouth daily.   doxycycline (VIBRA-TABS) 100 MG tablet Take 1 tablet (100 mg total) by mouth 2 (two) times daily.   fluticasone (FLONASE) 50 MCG/ACT nasal spray Place 2 sprays into both nostrils daily as needed for allergies or rhinitis.   losartan (COZAAR) 25 MG tablet TAKE 2 TABLETS(50 MG) BY MOUTH DAILY   nitroGLYCERIN (NITROSTAT) 0.4 MG SL tablet DISSOLVE ONE TABLET UNDER TONGUE AS NEEDED FOR CHEST PAIN FOR UP TO 3 DOSES   Polyethyl Glycol-Propyl Glycol (SYSTANE) 0.4-0.3 % SOLN Place 1 drop into both eyes  3 (three) times daily.   potassium chloride (KLOR-CON) 10 MEQ tablet TAKE 1 TABLET(10 MEQ) BY MOUTH DAILY   propranolol (INDERAL) 10 MG tablet Take 1 tablet (10 mg total) by mouth 4 (four) times daily as needed (for A Fib Attacks).   saccharomyces boulardii (FLORASTOR) 250 MG capsule Take 1 capsule (250 mg total) by mouth 2 (two) times daily.   spironolactone (ALDACTONE) 25 MG tablet TAKE 1 TABLET BY MOUTH EVERY OTHER DAY   No facility-administered encounter medications on file as of 03/15/2022.    Allergies (verified) Effexor [venlafaxine],  Flomax [tamsulosin], Isosorbide, Macrobid [nitrofurantoin], Other, B-12 compliance injection [cyanocobalamin], Statins, and Zetia [ezetimibe]   History: Past Medical History:  Diagnosis Date   Arthritis    CAD (coronary artery disease)    a. prior stenting history. b. NSTEMI s/p DES to prox LAD with EF 45% by cath 08/14/12. c. Mild trop elevation shortly after cath ?mild plaque embolization - patent stent on relook. // d. Myoview 8/18: EF 60, normal perfusion; Low Risk. e. NSTEMI 06/2018- patent prior LAD stent, 80% OM1 s/p DES, normal LVEDP, EF 45-50%, moderate residual disease treated medically.   Fatty infiltration of liver    GERD (gastroesophageal reflux disease)    Hyperlipidemia    Hypertension    Hypokalemia    Hypotension    Myocardial infarction ([place]) 2014   OSA on CPAP    Osteoporosis    PAF (paroxysmal atrial fibrillation) ([place])    On rythmol previously   Pulmonary nodule    a. 40m subpleural nodular density CT 08/2012, instructed to f/u pulm MD.   Sinus bradycardia    Tubular adenoma of colon 2007   Past Surgical History:  Procedure Laterality Date   ATRIAL FIBRILLATION ABLATION N/A 07/27/2020   Procedure: ATRIAL FIBRILLATION ABLATION;  Surgeon: AThompson Grayer MD;  [facility];  Service: Cardiovascular;  Laterality: N/A;   COLONOSCOPY  03/07/2016   COLONOSCOPY WITH PROPOFOL  07/04/2021   CORONARY ANGIOPLASTY WITH STENT PLACEMENT  08/14/2012   LAD  Dr MSherren Mocha  CORONARY STENT INTERVENTION N/A 06/14/2018   Procedure: CORONARY STENT INTERVENTION;  Surgeon: VJettie Booze MD;  [facility];  Service: Cardiovascular;  Laterality: N/A;   CORONARY STENT PLACEMENT  2000   hair restoration  02/2017   LEFT HEART CATH AND CORONARY ANGIOGRAPHY N/A 06/14/2018   Procedure: LEFT HEART CATH AND CORONARY ANGIOGRAPHY;  Surgeon: VJettie Booze MD;  [facility];  Service: Cardiovascular;  Laterality: N/A;   LEFT HEART  CATH AND CORONARY ANGIOGRAPHY N/A 05/14/2019   Procedure: LEFT HEART CATH AND CORONARY ANGIOGRAPHY;  Surgeon: CSherren Mocha MD;  [facility];  Service: Cardiovascular;  Laterality: N/A;   LEFT HEART CATHETERIZATION WITH CORONARY ANGIOGRAM N/A 08/14/2012   Procedure: LEFT HEART CATHETERIZATION WITH CORONARY ANGIOGRAM;  Surgeon: MSherren Mocha MD;  [facility];  Service: Cardiovascular;  Laterality: N/A;   LEFT HEART CATHETERIZATION WITH CORONARY ANGIOGRAM N/A 08/19/2012   Procedure: LEFT HEART CATHETERIZATION WITH CORONARY ANGIOGRAM;  Surgeon: MSherren Mocha MD;  [facility];  Service: Cardiovascular;  Laterality: N/A;   ROTATOR CUFF REPAIR     TONSILLECTOMY     TOTAL ABDOMINAL HYSTERECTOMY     Family History  Problem Relation Age of Onset   Heart disease Father    Alzheimer's disease Mother    Breast cancer Maternal Aunt    Colon cancer Neg Hx    Social History  Socioeconomic History   Marital status: Married    Spouse name: Not on file   Number of children: 2   Years of education: Not on file   Highest education level: Not on file  Occupational History   Occupation: Retired    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  Tobacco Use   Smoking status: Never   Smokeless tobacco: Never  Vaping Use   Vaping Use: Never used  Substance and Sexual Activity   Alcohol use: Yes    Alcohol/week: 1.0 standard drink of alcohol    Types: 1 Glasses of wine per week    Comment: 2 drinks daily    Drug use: No   Sexual activity: Not Currently  Other Topics Concern   Not on file  Social History Narrative   Daily caffeine    Lives in [place]   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[place] Strain: [place]  (03/15/2022)   Overall Financial Resource Strain (CARDIA)    Difficulty of Paying Living Expenses: Not hard at all  Food Insecurity: No Food Insecurity  (03/15/2022)   Hunger Vital Sign    Worried About Running Out of Food in the Last Year: Never true    [place] in the Last Year: Never true  Transportation Needs: No Transportation Needs (03/15/2022)   PRAPARE - Hydrologist (Medical): No    Lack of Transportation (Non-Medical): No  Physical Activity: Insufficiently Active (03/15/2022)   Exercise Vital Sign    Days of Exercise per Week: 3 days    Minutes of Exercise per Session: 30 min  Stress: No Stress Concern Present (03/15/2022)   [place]    Feeling of Stress : Not at all  Social Connections: Moderately Isolated (03/15/2022)   Social Connection and Isolation Panel [NHANES]    Frequency of Communication with Friends and Family: More than three times a week    Frequency of Social Gatherings with Friends and Family: More than three times a week    Attends Religious Services: Never    Marine scientist or Organizations: No    Attends Music therapist: Never    Marital Status: Married    Tobacco Counseling Counseling given: Not Answered   Clinical Intake:  Pre-visit preparation completed: Yes  Pain : No/denies pain     Nutritional Risks: None Diabetes: No  How often do you need to have someone help you when you read instructions, pamphlets, or other written materials from your doctor or pharmacy?: 1 - Never  Diabetic?no   Interpreter Needed?: No  Information entered by :: Jadene Pierini, LPN   Activities of Daily Living    03/15/2022   11:27 AM  In your present state of health, do you have any difficulty performing the following activities:  Hearing? 0  Vision? 0  Difficulty concentrating or making decisions? 0  Walking or climbing stairs? 0  Dressing or bathing? 0  Doing errands, shopping? 0  Preparing Food and eating ? N  Using the Toilet? N  In the past six months, have you accidently  leaked urine? N  Do you have problems with loss of bowel control? N  Managing your Medications? N  Managing your Finances? N  Housekeeping or managing your Housekeeping? N    Patient Care Team: [name],  MD as PCP - General (Family Medicine) Nahser, Wonda Cheng, MD as PCP - Cardiology (Cardiology) Thompson Grayer, MD as PCP - Electrophysiology (Cardiology) Key, Nelia Shi, NP as Nurse Practitioner (Gynecology) Oscar La, grant (Dermatology) Renda Rolls, Jennefer Bravo, MD as Referring Physician (Dermatology) Nahser, Wonda Cheng, MD as Consulting Physician (Cardiology) Bjorn Loser, MD as Consulting Physician (Urology) Ladene Artist, MD as Consulting Physician (Gastroenterology)  Indicate any recent Medical Services you may have received from other than [facility] providers in the past year (date may be approximate).     Assessment:   This is a routine wellness examination for [place].  Hearing/Vision screen Vision Screening - Comments:: Annual eye exam wear glasses   Dietary issues and exercise activities discussed: Current Exercise Habits: Home exercise routine, Type of exercise: walking, Time (Minutes): 30, Frequency (Times/Week): 3, Weekly Exercise (Minutes/Week): 90, Intensity: Mild, Exercise limited by: None identified   Goals Addressed             This Visit's Progress    Patient Stated   On track    Eat healthier & increase activity       Depression Screen    03/15/2022   11:26 AM 10/18/2021   10:12 AM 10/25/2020   11:31 AM 05/03/2020   11:11 AM 04/08/2020   11:31 AM 08/11/2019    2:57 PM 04/28/2019   11:04 AM  PHQ 2/9 Scores  PHQ - 2 Score 0 0 0 1 0 0 0  PHQ- 9 Score  3 0  0 0 0    Fall Risk    03/15/2022   11:24 AM 10/18/2021   10:13 AM 10/25/2020   11:31 AM 05/03/2020   11:10 AM 04/08/2020   11:31 AM  Fall Risk   Falls in the past year? 0 0 0 1 0  Number falls in past yr: 0 0 0 1 0  Injury with Fall? 0 0 0 0 0  Risk for fall due to : No Fall Risks No  Fall Risks No Fall Risks  No Fall Risks  Follow up Falls prevention discussed Falls evaluation completed  Falls prevention discussed     FALL RISK PREVENTION PERTAINING TO THE HOME:  Any stairs in or around the home? Yes  If so, are there any without handrails? No  Home free of loose throw rugs in walkways, pet beds, electrical cords, etc? Yes  Adequate lighting in your home to reduce risk of falls? Yes   ASSISTIVE DEVICES UTILIZED TO PREVENT FALLS:  Life alert? No  Use of a cane, walker or w/c? No  Grab bars in the bathroom? No  Shower chair or bench in shower? No  Elevated toilet seat or a handicapped toilet? No       05/02/2017    2:19 PM  MMSE - Mini Mental State Exam  Orientation to time 5  Orientation to Place 5  Registration 2  Attention/ Calculation 5  Recall 3  Language- name 2 objects 2  Language- repeat 1  Language- follow 3 step command 3  Language- read & follow direction 1  Write a sentence 1  Copy design 1  Total score 29        03/15/2022   11:29 AM 05/03/2020   11:27 AM  6CIT Screen  What Year? 0 points 0 points  What month? 0 points 0 points  What time? 0 points 0 points  Count back from 20 0 points 0 points  Months in reverse 0 points 0 points  Repeat  phrase 0 points 0 points  Total Score 0 points 0 points    Immunizations Immunization History  Administered Date(s) Administered   Fluad Quad(high Dose 65+) 03/04/2019, 05/03/2020   Influenza Split 03/06/2011, 03/05/2012   Influenza Whole 04/06/2010   Influenza, High Dose Seasonal PF 01/04/2016, 04/10/2018   Influenza,inj,Quad PF,6+ Mos 03/26/2017   PFIZER Comirnaty(Gray Top)Covid-19 Tri-Sucrose Vaccine 11/04/2020   PFIZER(Purple Top)SARS-COV-2 Vaccination 07/15/2019, 08/12/2019   Pneumococcal Conjugate-13 07/10/2016   Pneumococcal Polysaccharide-23 08/15/2012   Zoster Recombinat (Shingrix) 07/13/2020, 07/14/2020   Zoster, Live 07/10/2012    TDAP status: Due, Education has been  provided regarding the importance of this vaccine. Advised may receive this vaccine at local pharmacy or Health Dept. Aware to provide a copy of the vaccination record if obtained from local pharmacy or Health Dept. Verbalized acceptance and understanding.  Flu Vaccine status: Due, Education has been provided regarding the importance of this vaccine. Advised may receive this vaccine at local pharmacy or Health Dept. Aware to provide a copy of the vaccination record if obtained from local pharmacy or Health Dept. Verbalized acceptance and understanding.  Pneumococcal vaccine status: Up to date  Covid-19 vaccine status: Completed vaccines  Qualifies for Shingles Vaccine? Yes   Zostavax completed No   Shingrix Completed?: No.    Education has been provided regarding the importance of this vaccine. Patient has been advised to call insurance company to determine out of pocket expense if they have not yet received this vaccine. Advised may also receive vaccine at local pharmacy or Health Dept. Verbalized acceptance and understanding.  Screening Tests Health Maintenance  Topic Date Due   Hepatitis C Screening  Never done   INFLUENZA VACCINE  09/03/2022 (Originally 01/03/2022)   TETANUS/TDAP  10/19/2022 (Originally 06/28/1964)   COLONOSCOPY (Pts 45-43yr Insurance coverage will need to be confirmed)  07/04/2024   Pneumonia Vaccine 75 Years old  Completed   HPV VACCINES  Aged Out   DEXA SCAN  Discontinued   COVID-19 Vaccine  Discontinued   Zoster Vaccines- Shingrix  Discontinued    Health Maintenance  Health Maintenance Due  Topic Date Due   Hepatitis C Screening  Never done    Colorectal cancer screening: No longer required.   Mammogram status: No longer required due to age.  Bone Density status: Ordered 03/15/2022. Pt provided with contact info and advised to call to schedule appt.  Lung Cancer Screening: (Low Dose CT Chest recommended if Age 75-80years, 30 pack-year currently smoking  OR have quit w/in 15years.) does not qualify.   Lung Cancer Screening Referral: n/a  Additional Screening:  Hepatitis C Screening: does not qualify;   Vision Screening: Recommended annual ophthalmology exams for early detection of glaucoma and other disorders of the eye. Is the patient up to date with their annual eye exam?  Yes  Who is the provider or what is the name of the office in which the patient attends annual eye exams? Dr.Barts  If pt is not established with a provider, would they like to be referred to a provider to establish care? No .   Dental Screening: Recommended annual dental exams for proper oral hygiene  Community Resource Referral / Chronic Care Management: CRR required this visit?  No   CCM required this visit?  No      Plan:     I have personally reviewed and noted the following in the patient's chart:   Medical and social history Use of alcohol, tobacco or illicit drugs  Current medications and  supplements including opioid prescriptions. Patient is not currently taking opioid prescriptions. Functional ability and status Nutritional status Physical activity Advanced directives List of other physicians Hospitalizations, surgeries, and ER visits in previous 12 months Vitals Screenings to include cognitive, depression, and falls Referrals and appointments  In addition, I have reviewed and discussed with patient certain preventive protocols, quality metrics, and best practice recommendations. A written personalized care plan for preventive services as well as general preventive health recommendations were provided to patient.     Daphane Shepherd, LPN   [id number]   Nurse Notes: Due Flu /TDAP Vaccine

## 2022-03-15 NOTE — Patient Instructions (Signed)
[name] , Thank you for taking time to come for your Medicare Wellness Visit. I appreciate your ongoing commitment to your health goals. Please review the following plan we discussed and let me know if I can assist you in the future.   These are the goals we discussed:  Goals      LDL CALC < 70     Patient Stated     Eat healthier & increase activity        This is a list of the screening recommended for you and due dates:  Health Maintenance  Topic Date Due   Hepatitis C Screening: USPSTF Recommendation to screen - Ages 12-79 yo.  Never done   Flu Shot  09/03/2022*   Tetanus Vaccine  10/19/2022*   Colon Cancer Screening  07/04/2024   Pneumonia Vaccine  Completed   HPV Vaccine  Aged Out   DEXA scan (bone density measurement)  Discontinued   COVID-19 Vaccine  Discontinued   Zoster (Shingles) Vaccine  Discontinued  *Topic was postponed. The date shown is not the original due date.    Advanced directives: Please bring a copy of your health care power of attorney and living will to the office to be added to your chart at your convenience.   Conditions/risks identified: Aim for 30 minutes of exercise or brisk walking, 6-8 glasses of water, and 5 servings of fruits and vegetables each day.   Next appointment: Follow up in one year for your annual wellness visit    Preventive Care 65 Years and Older, Female Preventive care refers to lifestyle choices and visits with your health care provider that can promote health and wellness. What does preventive care include? A yearly physical exam. This is also called an annual well check. Dental exams once or twice a year. Routine eye exams. Ask your health care provider how often you should have your eyes checked. Personal lifestyle choices, including: Daily care of your teeth and gums. Regular physical activity. Eating a healthy diet. Avoiding tobacco and drug use. Limiting alcohol use. Practicing safe sex. Taking low-dose aspirin  every day. Taking vitamin and mineral supplements as recommended by your health care provider. What happens during an annual well check? The services and screenings done by your health care provider during your annual well check will depend on your age, overall health, lifestyle risk factors, and family history of disease. Counseling  Your health care provider may ask you questions about your: Alcohol use. Tobacco use. Drug use. Emotional well-being. Home and relationship well-being. Sexual activity. Eating habits. History of falls. Memory and ability to understand (cognition). Work and work Statistician. Reproductive health. Screening  You may have the following tests or measurements: Height, weight, and BMI. Blood pressure. Lipid and cholesterol levels. These may be checked every 5 years, or more frequently if you are over 9 years old. Skin check. Lung cancer screening. You may have this screening every year starting at age 48 if you have a 30-pack-year history of smoking and currently smoke or have quit within the past 15 years. Fecal occult blood test (FOBT) of the stool. You may have this test every year starting at age 6. Flexible sigmoidoscopy or colonoscopy. You may have a sigmoidoscopy every 5 years or a colonoscopy every 10 years starting at age 92. Hepatitis C blood test. Hepatitis B blood test. Sexually transmitted disease (STD) testing. Diabetes screening. This is done by checking your blood sugar (glucose) after you have not eaten for a while (fasting).  You may have this done every 1-3 years. Bone density scan. This is done to screen for osteoporosis. You may have this done starting at age 29. Mammogram. This may be done every 1-2 years. Talk to your health care provider about how often you should have regular mammograms. Talk with your health care provider about your test results, treatment options, and if necessary, the need for more tests. Vaccines  Your health  care provider may recommend certain vaccines, such as: Influenza vaccine. This is recommended every year. Tetanus, diphtheria, and acellular pertussis (Tdap, Td) vaccine. You may need a Td booster every 10 years. Zoster vaccine. You may need this after age 87. Pneumococcal 13-valent conjugate (PCV13) vaccine. One dose is recommended after age 43. Pneumococcal polysaccharide (PPSV23) vaccine. One dose is recommended after age 14. Talk to your health care provider about which screenings and vaccines you need and how often you need them. This information is not intended to replace advice given to you by your health care provider. Make sure you discuss any questions you have with your health care provider. Document Released: 06/18/2015 Document Revised: 02/09/2016 Document Reviewed: 03/23/2015 Elsevier Interactive Patient Education  2017 [employer] Prevention in the Home Falls can cause injuries. They can happen to people of all ages. There are many things you can do to make your home safe and to help prevent falls. What can I do on the outside of my home? Regularly fix the edges of walkways and driveways and fix any cracks. Remove anything that might make you trip as you walk through a door, such as a raised step or threshold. Trim any bushes or trees on the path to your home. Use bright outdoor lighting. Clear any walking paths of anything that might make someone trip, such as rocks or tools. Regularly check to see if handrails are loose or broken. Make sure that both sides of any steps have handrails. Any raised decks and porches should have guardrails on the edges. Have any leaves, snow, or ice cleared regularly. Use sand or salt on walking paths during winter. Clean up any spills in your garage right away. This includes oil or grease spills. What can I do in the bathroom? Use night lights. Install grab bars by the toilet and in the tub and shower. Do not use towel bars as grab  bars. Use non-skid mats or decals in the tub or shower. If you need to sit down in the shower, use a plastic, non-slip stool. Keep the floor dry. Clean up any water that spills on the floor as soon as it happens. Remove soap buildup in the tub or shower regularly. Attach bath mats securely with double-sided non-slip rug tape. Do not have throw rugs and other things on the floor that can make you trip. What can I do in the bedroom? Use night lights. Make sure that you have a light by your bed that is easy to reach. Do not use any sheets or blankets that are too big for your bed. They should not hang down onto the floor. Have a firm chair that has side arms. You can use this for support while you get dressed. Do not have throw rugs and other things on the floor that can make you trip. What can I do in the kitchen? Clean up any spills right away. Avoid walking on wet floors. Keep items that you use a lot in easy-to-reach places. If you need to reach something above you, use a  strong step stool that has a grab bar. Keep electrical cords out of the way. Do not use floor polish or wax that makes floors slippery. If you must use wax, use non-skid floor wax. Do not have throw rugs and other things on the floor that can make you trip. What can I do with my stairs? Do not leave any items on the stairs. Make sure that there are handrails on both sides of the stairs and use them. Fix handrails that are broken or loose. Make sure that handrails are as long as the stairways. Check any carpeting to make sure that it is firmly attached to the stairs. Fix any carpet that is loose or worn. Avoid having throw rugs at the top or bottom of the stairs. If you do have throw rugs, attach them to the floor with carpet tape. Make sure that you have a light switch at the top of the stairs and the bottom of the stairs. If you do not have them, ask someone to add them for you. What else can I do to help prevent  falls? Wear shoes that: Do not have high heels. Have rubber bottoms. Are comfortable and fit you well. Are closed at the toe. Do not wear sandals. If you use a stepladder: Make sure that it is fully opened. Do not climb a closed stepladder. Make sure that both sides of the stepladder are locked into place. Ask someone to hold it for you, if possible. Clearly mark and make sure that you can see: Any grab bars or handrails. First and last steps. Where the edge of each step is. Use tools that help you move around (mobility aids) if they are needed. These include: Canes. Walkers. Scooters. Crutches. Turn on the lights when you go into a dark area. Replace any light bulbs as soon as they burn out. Set up your furniture so you have a clear path. Avoid moving your furniture around. If any of your floors are uneven, fix them. If there are any pets around you, be aware of where they are. Review your medicines with your doctor. Some medicines can make you feel dizzy. This can increase your chance of falling. Ask your doctor what other things that you can do to help prevent falls. This information is not intended to replace advice given to you by your health care provider. Make sure you discuss any questions you have with your health care provider. Document Released: 03/18/2009 Document Revised: 10/28/2015 Document Reviewed: 06/26/2014 Elsevier Interactive Patient Education  2017 [employer].

## 2022-03-28 DIAGNOSIS — H43812 Vitreous degeneration, left eye: Secondary | ICD-10-CM | POA: Diagnosis not present

## 2022-03-28 DIAGNOSIS — H33002 Unspecified retinal detachment with retinal break, left eye: Secondary | ICD-10-CM | POA: Diagnosis not present

## 2022-04-05 DIAGNOSIS — H43812 Vitreous degeneration, left eye: Secondary | ICD-10-CM | POA: Diagnosis not present

## 2022-04-17 DIAGNOSIS — Z23 Encounter for immunization: Secondary | ICD-10-CM | POA: Diagnosis not present

## 2022-05-04 ENCOUNTER — Other Ambulatory Visit: Payer: Self-pay | Admitting: Pharmacist

## 2022-05-04 MED ORDER — PRALUENT 75 MG/ML ~~LOC~~ SOAJ: 75.0000 mg | SUBCUTANEOUS | 3 refills | Status: DC

## 2022-05-17 DIAGNOSIS — H43812 Vitreous degeneration, left eye: Secondary | ICD-10-CM | POA: Diagnosis not present

## 2022-05-17 DIAGNOSIS — Z09 Encounter for follow-up examination after completed treatment for conditions other than malignant neoplasm: Secondary | ICD-10-CM | POA: Diagnosis not present

## 2022-05-20 ENCOUNTER — Other Ambulatory Visit: Payer: Self-pay | Admitting: Medical

## 2022-05-21 ENCOUNTER — Other Ambulatory Visit: Payer: Self-pay | Admitting: Family Medicine

## 2022-05-21 DIAGNOSIS — K219 Gastro-esophageal reflux disease without esophagitis: Secondary | ICD-10-CM

## 2022-05-22 ENCOUNTER — Other Ambulatory Visit: Payer: Self-pay

## 2022-05-22 MED ORDER — POTASSIUM CHLORIDE ER 10 MEQ PO TBCR: 10.0000 meq | EXTENDED_RELEASE_TABLET | Freq: Every day | ORAL | 0 refills | Status: DC

## 2022-05-22 NOTE — Telephone Encounter (Signed)
Rx refill sent to pharmacy. 

## 2022-06-02 ENCOUNTER — Emergency Department (HOSPITAL_BASED_OUTPATIENT_CLINIC_OR_DEPARTMENT_OTHER): Payer: Medicare Other | Admitting: Radiology

## 2022-06-02 ENCOUNTER — Other Ambulatory Visit: Payer: Self-pay

## 2022-06-02 ENCOUNTER — Emergency Department (HOSPITAL_BASED_OUTPATIENT_CLINIC_OR_DEPARTMENT_OTHER)
Admission: EM | Admit: 2022-06-02 | Discharge: 2022-06-02 | Disposition: A | Discharge status: Home / Self Care | Payer: Medicare Other | Attending: Emergency Medicine | Admitting: Emergency Medicine

## 2022-06-02 ENCOUNTER — Encounter (HOSPITAL_BASED_OUTPATIENT_CLINIC_OR_DEPARTMENT_OTHER): Payer: Self-pay | Admitting: Emergency Medicine

## 2022-06-02 DIAGNOSIS — I2510 Atherosclerotic heart disease of native coronary artery without angina pectoris: Secondary | ICD-10-CM | POA: Insufficient documentation

## 2022-06-02 DIAGNOSIS — R0789 Other chest pain: Secondary | ICD-10-CM | POA: Diagnosis not present

## 2022-06-02 DIAGNOSIS — R079 Chest pain, unspecified: Secondary | ICD-10-CM | POA: Diagnosis not present

## 2022-06-02 DIAGNOSIS — Z7901 Long term (current) use of anticoagulants: Secondary | ICD-10-CM | POA: Insufficient documentation

## 2022-06-02 LAB — BASIC METABOLIC PANEL
Anion gap: 15 (ref 5–15)
BUN: 15 mg/dL (ref 8–23)
CO2: 23 mmol/L (ref 22–32)
Calcium: 9.6 mg/dL (ref 8.9–10.3)
Chloride: 96 mmol/L — ABNORMAL LOW (ref 98–111)
Creatinine, Ser: 0.96 mg/dL (ref 0.44–1.00)
GFR, Estimated: >60 mL/min (ref >60)
Glucose, Bld: 100 mg/dL — ABNORMAL HIGH (ref 70–99)
Potassium: 4 mmol/L (ref 3.5–5.1)
Sodium: 134 mmol/L — ABNORMAL LOW (ref 135–145)

## 2022-06-02 LAB — CBC
HCT: 41.2 % (ref 36.0–46.0)
Hemoglobin: 13.8 g/dL (ref 12.0–15.0)
MCH: 30.5 pg (ref 26.0–34.0)
MCHC: 33.5 g/dL (ref 30.0–36.0)
MCV: 90.9 fL (ref 80.0–100.0)
Platelets: 246 10*3/uL (ref 150–400)
RBC: 4.53 MIL/uL (ref 3.87–5.11)
RDW: 13.1 % (ref 11.5–15.5)
WBC: 7.8 10*3/uL (ref 4.0–10.5)
nRBC: 0 % (ref 0.0–0.2)

## 2022-06-02 LAB — TROPONIN I (HIGH SENSITIVITY)
Troponin I (High Sensitivity): 4 ng/L (ref <18)
Troponin I (High Sensitivity): 3 ng/L (ref <18)

## 2022-06-02 MED ORDER — ACETAMINOPHEN 325 MG PO TABS
650.0000 mg | ORAL_TABLET | Freq: Once | ORAL | Status: AC
  Administered 2022-06-02: 650 mg via ORAL
  Filled 2022-06-02: qty 2

## 2022-06-02 MED ORDER — SODIUM CHLORIDE 0.9 % IV BOLUS
500.0000 mL | Freq: Once | INTRAVENOUS | Status: AC
  Administered 2022-06-02: 500 mL via INTRAVENOUS

## 2022-06-02 MED ORDER — NITROGLYCERIN 0.4 MG SL SUBL
0.4000 mg | SUBLINGUAL_TABLET | SUBLINGUAL | Status: DC | PRN
  Administered 2022-06-02 (×2): 0.4 mg via SUBLINGUAL
  Filled 2022-06-02: qty 1

## 2022-06-02 MED ORDER — ASPIRIN 81 MG PO CHEW
324.0000 mg | CHEWABLE_TABLET | Freq: Once | ORAL | Status: AC
  Administered 2022-06-02: 324 mg via ORAL
  Filled 2022-06-02: qty 4

## 2022-06-02 NOTE — ED Notes (Signed)
Patient given nitro SL  Chest pain was 7/10 and after two nitro states pain is about a 1/10.  Will continue to monitor

## 2022-06-02 NOTE — Discharge Instructions (Signed)
Your workup today does not show a heart attack. However, that does not mean there is nothing wrong with your heart. Follow up closely with your cardiologist.  If you develop recurrent chest pain, shortness of breath, or any other new/concerning symptoms then return to the ER or call 911.

## 2022-06-02 NOTE — ED Provider Notes (Signed)
[facility] DE[facility] Provider Note   CSN: [id number] Arrival date & time: 06/02/22  1202     History  Chief Complaint  Patient presents with   Chest Pain    Shannon Cummings is a 76 y.o. female.  HPI 76 year old female with a history of cardiac disease status post stents presents with chest pain.  Started around 11 AM this morning.  She was not doing anything specific when it started.  It is a deep heavy chest pressure across her entire chest.  When she got here she started to feel like it was going into her left arm and her left arm got numb and she got lightheaded but did not pass out.  There is no back or abdominal pain.  She does feel short of breath.  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  She took 2 nitroglycerin without any relief.   Home Medications Prior to Admission medications   Medication Sig Start Date End Date Taking? Authorizing Provider  acetaminophen (TYLENOL) 650 MG CR tablet Take 650 mg by mouth every 8 (eight) hours as needed for pain.    [provider]  Alirocumab (PRALUENT) 75 MG/ML SOAJ Inject 75 mg into the skin every 14 (fourteen) days. 05/04/22   Nahser, Wonda Cheng, MD  apixaban (ELIQUIS) 5 MG TABS tablet TAKE 1 TABLET(5 MG) BY MOUTH TWICE DAILY 02/20/22   Nahser, Wonda Cheng, MD  Azelaic Acid 15 % gel Apply topically daily. 05/24/21   [provider]  Bempedoic Acid (NEXLETOL) 180 MG TABS Take 1 tablet by mouth daily. 02/07/22   Nahser, Wonda Cheng, MD  benzonatate (TESSALON PERLES) 100 MG capsule 1-2 capsules up to twice daily as needed for cough 02/14/22   Lucretia Kern, DO  Cholecalciferol (VITAMIN D3) 50 MCG (2000 UT) capsule Take 2,000 Units by mouth daily.    [provider]  doxycycline (VIBRA-TABS) 100 MG tablet Take 1 tablet (100 mg total) by mouth 2 (two) times daily. 02/14/22   Lucretia Kern, DO  fluticasone (FLONASE) 50 MCG/ACT nasal spray Place 2 sprays into both nostrils daily as needed for  allergies or rhinitis. 01/08/20   [name], MD  losartan (COZAAR) 25 MG tablet TAKE 2 TABLETS(50 MG) BY MOUTH DAILY 08/24/21   Nahser, Wonda Cheng, MD  nitroGLYCERIN (NITROSTAT) 0.4 MG SL tablet Place 1 tablet (0.4 mg total) under the tongue every 5 (five) minutes as needed for chest pain. 05/22/22   Nahser, Wonda Cheng, MD  Polyethyl Glycol-Propyl Glycol (SYSTANE) 0.4-0.3 % SOLN Place 1 drop into both eyes 3 (three) times daily.    [provider]  potassium chloride (KLOR-CON) 10 MEQ tablet Take 1 tablet (10 mEq total) by mouth daily. 05/22/22   Nahser, Wonda Cheng, MD  propranolol (INDERAL) 10 MG tablet Take 1 tablet (10 mg total) by mouth 4 (four) times daily as needed (for A Fib Attacks). 06/13/21   Nahser, Wonda Cheng, MD  saccharomyces boulardii (FLORASTOR) 250 MG capsule Take 1 capsule (250 mg total) by mouth 2 (two) times daily. 04/08/20   [name], MD  spironolactone (ALDACTONE) 25 MG tablet TAKE 1 TABLET BY MOUTH EVERY OTHER DAY 09/07/21   Sherran Needs, NP      Allergies    Effexor [venlafaxine], Flomax [tamsulosin], Isosorbide, Macrobid [nitrofurantoin], Other, B-12 compliance injection [cyanocobalamin], Statins, and Zetia [ezetimibe]    Review of Systems   Review of Systems  Constitutional:  Negative for diaphoresis.  Respiratory:  Positive for shortness of breath.   Cardiovascular:  Positive for chest pain. Negative for leg swelling.  Gastrointestinal:  Negative for vomiting.    Physical Exam Updated Vital Signs BP 136/72   Pulse (!) 52   Temp 97.8 F (36.6 C)   Resp 14   Ht 5' (1.524 m)   Wt 59 kg   SpO2 99%   BMI 25.39 kg/m  Physical Exam Vitals and nursing note reviewed.  Constitutional:      Appearance: She is well-developed. She is not diaphoretic.  HENT:     Head: Normocephalic and atraumatic.  Cardiovascular:     Rate and Rhythm: Normal rate and regular rhythm.     Pulses:          Radial pulses are 2+ on the right side and 2+ on the left  side.     Heart sounds: Normal heart sounds.  Pulmonary:     Effort: Pulmonary effort is normal.     Breath sounds: Normal breath sounds.  Abdominal:     Palpations: Abdomen is soft.     Tenderness: There is no abdominal tenderness.  Skin:    General: Skin is warm and dry.  Neurological:     Mental Status: She is alert.     ED Results / Procedures / Treatments   Labs (all labs ordered are listed, but only abnormal results are displayed) Labs Reviewed  BASIC METABOLIC PANEL - Abnormal; Notable for the following components:      Result Value   Sodium 134 (*)    Chloride 96 (*)    Glucose, Bld 100 (*)    All other components within normal limits  CBC  TROPONIN I (HIGH SENSITIVITY)  TROPONIN I (HIGH SENSITIVITY)    EKG EKG Interpretation  Date/Time:  Friday June 02 2022 13:07:30 EST Ventricular Rate:  63 PR Interval:  205 QRS Duration: 97 QT Interval:  421 QTC Calculation: 431 R Axis:   34 Text Interpretation: Sinus rhythm Low voltage, precordial leads Confirmed by Sherwood Gambler [id number]) on 06/02/2022 1:26:52 PM  Radiology DG Chest 2 View  Result Date: 06/02/2022 CLINICAL DATA:  Chest pain EXAM: CHEST - 2 VIEW COMPARISON:  Chest radiograph May 13, 2019. FINDINGS: The heart size and mediastinal contours are within normal limits. Aortic atherosclerosis. Coronary stents. No focal consolidation. No pleural effusion. No pneumothorax. The visualized skeletal structures are unremarkable. IMPRESSION: No acute cardiopulmonary disease. Electronically Signed   By: Dahlia Bailiff M.D.   On: 06/02/2022 12:50    Procedures Procedures    Medications Ordered in ED Medications  nitroGLYCERIN (NITROSTAT) SL tablet 0.4 mg (0.4 mg Sublingual Given 06/02/22 1318)  aspirin chewable tablet 324 mg (324 mg Oral Given 06/02/22 1310)  sodium chloride 0.9 % bolus 500 mL (500 mLs Intravenous New Bag/Given 06/02/22 1355)  acetaminophen (TYLENOL) tablet 650 mg (650 mg Oral Given  06/02/22 1349)    ED Course/ Medical Decision Making/ A&P                           Medical Decision Making Amount and/or Complexity of Data Reviewed Labs: ordered.    Details: Normal Troponins x 2 Radiology: ordered and independent interpretation performed.    Details: No CHF ECG/medicine tests: independent interpretation performed.    Details: No obvious ischemia  Risk OTC drugs. Prescription drug management.   Patient presents with chest pressure.  Known history of CAD, most recent heart cath in 2020.  She was given aspirin and 2 nitroglycerin and now has resolution of her pain though starting to get a little bit of a headache.  Given Tylenol and fluids.  For troponin is normal.  I discussed case with cardiology on-call, Dr. Marisue Ivan who reviewed the chart and presentation.  He recommends 2nd troponin, and if negative can go home on imdur with close cardiology follow up.  Second troponin is negative and she has remained pain-free.  However, she reports an intolerance to Imdur and does not want to start this.  I think this is reasonable and so we will discharge her home to follow-up with her cardiologist.  However we did discuss that while no MI is apparent today, she should return if she develops recurrent chest pain and/or call 911.  I doubt PE or dissection.        Final Clinical Impression(s) / ED Diagnoses Final diagnoses:  Chest pain, unspecified type    Rx / DC Orders ED Discharge Orders          Ordered    Ambulatory referral to Cardiology       Comments: If you have not heard from the Cardiology office within the next 72 hours please call [phone].   06/02/22 [id number]              Sherwood Gambler, MD 06/02/22 [phone]

## 2022-06-02 NOTE — ED Triage Notes (Addendum)
Pt arrives to ED with c/o chest pain that started 40 minutes ago. The CP is generalized with left arm numbness. The pain is described as a heaviness. The CP started suddenly while pt was at rest. Hx MI. X2 Nitro w/o relief at home.

## 2022-07-13 ENCOUNTER — Encounter (HOSPITAL_COMMUNITY): Payer: Self-pay | Admitting: *Deleted

## 2022-07-24 ENCOUNTER — Other Ambulatory Visit: Payer: Self-pay

## 2022-07-24 ENCOUNTER — Other Ambulatory Visit: Payer: Self-pay | Admitting: Cardiovascular Disease

## 2022-07-24 MED ORDER — SPIRONOLACTONE 25 MG PO TABS: ORAL_TABLET | ORAL | 0 refills | Status: DC

## 2022-08-01 ENCOUNTER — Encounter: Payer: Self-pay | Admitting: Cardiovascular Disease

## 2022-08-01 NOTE — Progress Notes (Unsigned)
Cardiology Office Note   Date:  08/02/2022   ID:  Shannon Cummings, Shannon Cummings 08-21-1945, MRN [id number]  PCP:  [name], MD  Cardiologist:   [name], MD   Chief Complaint  Patient presents with   Coronary Artery Disease        Congestive Heart Failure        Atrial Fibrillation        1. Coronary artery disease-status post stenting in the past.  She status post stenting of her proximal LAD  08/14/2012 2. Intermittent atrial fibrillation 3.  hyperlipidemia-she has been generally intolerant to most statins   Pt is doing well.  No cardiac complaints.     She complains of some tingling and numbness associated with the Crestor.  She has discontinued the Crestor because of that reason.  She has had reactions to most statins that we have tried.  October 08-2011  She has done well.  She has not had any angina or eposides of atrial fib.  She has retired and is traveling quite a bit.  Her husband has also retired.  She has not had any problems.  October 01, 2012:  She is having some bruising ( due to [place]).  She is enjoying the cardiac rehab.  She has noticed some low BP readings at the end of cardiac rehab.  She does not feel bad.  These low readings are typically asymptomatic.    No angina.  No arrhythmias.   August 20, 2013:  Shannon Cummings is doing ok.  She was admitted to the hospital with some CP recently.  Rule out for MI.    She has been doing [place].  Has gained a bit of weight.   She is not able to exercise because  Of left ankle problems.    August 19, 2014:   Shannon Cummings is a 77 y.o. female who presents for follow up of her atrial fib. Has gained some weight.  She is in the study for lipid management Has had leg cramps since last fall. Off and on  Has had more paroxysmal atrial fib.  Lasts about a minute. Seem to be occuring more frequently.   Jan. 18, 2017:  Shannon Cummings presents for follow up of her CAD and atrial fib.  Doing well.   Has visited  [place] recently .   No CP or dyspnea.   Tolerates the Rythmol.  Talked about her daughter who lives in [place].  Is not having any significant atrial fib issues.   Has been on lipid lowering study drug at [facility]  ( injectable cholesterol medication )   Jan.   25, 2018:  Doing well.   Feeling well Has had a stomach bug last week and now has a URI  - not the flu.   Has maintained NSR .  Is having hair loss.  Her research shows that amlodipine and atenolol can cause this   Aug. 23, 2018:  Shannon Cummings is seen today for eval of some elevated BP Has been having pain in her back, tightness in her chest  Went to ER.  Has had elevated BP for a week,   Went back to ER  Doubled her metoprolol XL .  Saw Richardson Dopp on Aug. 6. 2018 Was given Imdur - developed a headache that lasted 3 days  Lexiscan myoview was low risk.   Her back pain / chest tightness has resolved.   May 08, 2017: Shannon Cummings seen today for follow-up of her hypertension,  coronary artery disease, paroxysmal atrial fibrillation. Is feeling well  Is taking the Toprol XL just once a day - not BID as listed in MAR .   August 15, 2017   Shannon Cummings is doing well BP is much better.  Is exercising and watching her diet.   Is on a study drug for cholesterol   June 18, 2018: He was admitted to the hospital recently with a non-ST segment elevation myocardial infarction. The previously placed LAD stent was widely patent.  She had a tight stenosis in her first obtuse marginal artery that was stented. Function is mildly reduced with an EF of 45 to 50%.  She returned to the ER the day after she went home with an episode of rapid AF and CP .    Troponin was  0.18  Her rhythmol has been stopped . Saw Roderic Palau and was started on Amiodarone . The plan is for short term Amio and then consider AF ablation . They also discussed Tikosyn .  She tolerates her AF poorly.  We discussed taking short acting beta blockers if she  has AF with CP  Had been walking ,   Has been weak since the MI.    Feb. 26, 2020  Shannon Cummings is seen for follow up of her CAD and PAF Has had some shortness of breath . BP is a bit elevated today  amio has been reduced to 200 mg a day  Has an appt with Dr. Rayann Heman on March 23 to discuss ablation  Is in a lipid study   Nov. 22, 2021: Shannon Cummings is seen for follow up of her CAD, PAF . She had a NSTEMI in Dec.   Propafaone was stopped after that hospitalization.  Was started on Amiodarone  Doing well from a cardiac standpoint.  Is on lots abx.  Has not been exercising .    Will check lipids, liver enz, bmp today  Is being treated for C.diff colitis   Jan. 9, 2023 Shannon Cummings is seen for follow up of her CAD, PAF She had previously been on profafanone until a NSTEMI several years ago.   Was started on amio.   We referred her for AF ablation given the side effects of amio.   Was seen by [place].  She has had an atrial fib ablation since I last saw her. Continues with Eliquis.   No cp, Not as much exercise as she should be getting  Has lost some weight  - wt is 133 lbs    Feb. 28, 2024 Shannon Cummings is seen for follow up of her CAD, PAF.  S/p afib ablation She was seen in the hospital on June 02, 2022 with episodes of chest pain.  Felt like she was going to pass out .  She had taken 2 nitroglycerin without any relief. Troponins x 2 were normal (3, 4).  No further episodes of CP .   She is going back to [place] this spring .  Going to [place]  She has been on [employer] and bempedoic acid.  Despite our best efforts her LDL is still 96.  She started about 200.   Past Medical History:  Diagnosis Date   Arthritis    CAD (coronary artery disease)    a. prior stenting history. b. NSTEMI s/p DES to prox LAD with EF 45% by cath 08/14/12. c. Mild trop elevation shortly after cath ?mild plaque embolization - patent stent on relook. // d. Myoview 8/18: EF 60, normal perfusion; Low Risk.  e. NSTEMI  06/2018- patent prior LAD stent, 80% OM1 s/p DES, normal LVEDP, EF 45-50%, moderate residual disease treated medically.   Fatty infiltration of liver    GERD (gastroesophageal reflux disease)    Hyperlipidemia    Hypertension    Hypokalemia    Hypotension    Myocardial infarction ([place]) 2014   OSA on CPAP    Osteoporosis    PAF (paroxysmal atrial fibrillation) ([place])    On rythmol previously   Pulmonary nodule    a. 74m subpleural nodular density CT 08/2012, instructed to f/u pulm MD.   Sinus bradycardia    Tubular adenoma of colon 2007    Past Surgical History:  Procedure Laterality Date   ATRIAL FIBRILLATION ABLATION N/A 07/27/2020   Procedure: ATRIAL FIBRILLATION ABLATION;  Surgeon: AThompson Grayer MD;  [facility];  Service: Cardiovascular;  Laterality: N/A;   COLONOSCOPY  03/07/2016   COLONOSCOPY WITH PROPOFOL  07/04/2021   CORONARY ANGIOPLASTY WITH STENT PLACEMENT  08/14/2012   LAD  Dr MSherren Mocha  CORONARY STENT INTERVENTION N/A 06/14/2018   Procedure: CORONARY STENT INTERVENTION;  Surgeon: VJettie Booze MD;  [facility];  Service: Cardiovascular;  Laterality: N/A;   CORONARY STENT PLACEMENT  2000   hair restoration  02/2017   LEFT HEART CATH AND CORONARY ANGIOGRAPHY N/A 06/14/2018   Procedure: LEFT HEART CATH AND CORONARY ANGIOGRAPHY;  Surgeon: VJettie Booze MD;  [facility];  Service: Cardiovascular;  Laterality: N/A;   LEFT HEART CATH AND CORONARY ANGIOGRAPHY N/A 05/14/2019   Procedure: LEFT HEART CATH AND CORONARY ANGIOGRAPHY;  Surgeon: CSherren Mocha MD;  [facility];  Service: Cardiovascular;  Laterality: N/A;   LEFT HEART CATHETERIZATION WITH CORONARY ANGIOGRAM N/A 08/14/2012   Procedure: LEFT HEART CATHETERIZATION WITH CORONARY ANGIOGRAM;  Surgeon: MSherren Mocha MD;  [facility];  Service: Cardiovascular;  Laterality: N/A;   LEFT HEART CATHETERIZATION WITH CORONARY  ANGIOGRAM N/A 08/19/2012   Procedure: LEFT HEART CATHETERIZATION WITH CORONARY ANGIOGRAM;  Surgeon: MSherren Mocha MD;  [facility];  Service: Cardiovascular;  Laterality: N/A;   ROTATOR CUFF REPAIR     TONSILLECTOMY     TOTAL ABDOMINAL HYSTERECTOMY       Current Outpatient Medications  Medication Sig Dispense Refill   acetaminophen (TYLENOL) 650 MG CR tablet Take 650 mg by mouth every 8 (eight) hours as needed for pain.     Alirocumab (PRALUENT) 75 MG/ML SOAJ Inject 75 mg into the skin every 14 (fourteen) days. 6 mL 3   apixaban (ELIQUIS) 5 MG TABS tablet TAKE 1 TABLET(5 MG) BY MOUTH TWICE DAILY 180 tablet 1   Azelaic Acid 15 % gel Apply topically daily.     Bempedoic Acid (NEXLETOL) 180 MG TABS Take 1 tablet by mouth daily. 90 tablet 1   Cholecalciferol (VITAMIN D3) 50 MCG (2000 UT) capsule Take 2,000 Units by mouth daily.     fluticasone (FLONASE) 50 MCG/ACT nasal spray Place 2 sprays into both nostrils daily as needed for allergies or rhinitis. 48 g 1   losartan (COZAAR) 25 MG tablet TAKE 2 TABLETS(50 MG) BY MOUTH DAILY 180 tablet 3   nitroGLYCERIN (NITROSTAT) 0.4 MG SL tablet Place 1 tablet (0.4 mg total) under the tongue every 5 (five) minutes as needed for chest pain. 25 tablet 3   Polyethyl Glycol-Propyl Glycol (SYSTANE) 0.4-0.3 % SOLN Place 1 drop into both eyes 3 (three) times daily.  potassium chloride (KLOR-CON) 10 MEQ tablet Take 1 tablet (10 mEq total) by mouth daily. 90 tablet 0   propranolol (INDERAL) 10 MG tablet Take 1 tablet (10 mg total) by mouth 4 (four) times daily as needed (for A Fib Attacks). 360 tablet 3   spironolactone (ALDACTONE) 25 MG tablet TAKE 1 TABLET BY MOUTH EVERY OTHER DAY 15 tablet 0   No current facility-administered medications for this visit.    Allergies:   Effexor [venlafaxine], Flomax [tamsulosin], Isosorbide, Macrobid [nitrofurantoin], Other, B-12 compliance injection [cyanocobalamin], Statins, and Zetia [ezetimibe]    Social  History:  The patient  reports that she has never smoked. She has never used smokeless tobacco. She reports current alcohol use of about 1.0 standard drink of alcohol per week. She reports that she does not use drugs.   Family History:  The patient's family history includes Alzheimer's disease in her mother; Breast cancer in her maternal aunt; Heart disease in her father.    ROS: Noted in current history.  All other systems are negative.    Physical Exam: Blood pressure 128/70, pulse 76, height 5' (1.524 m), weight 134 lb 6.4 oz (61 kg), SpO2 95 %.       GEN:  Well nourished, well developed in no acute distress HEENT: Normal NECK: No JVD; No carotid bruits LYMPHATICS: No lymphadenopathy CARDIAC: RRR , no murmurs, rubs, gallops RESPIRATORY:  Clear to auscultation without rales, wheezing or rhonchi  ABDOMEN: Soft, non-tender, non-distended MUSCULOSKELETAL:  No edema; No deformity  SKIN: Warm and dry NEUROLOGIC:  Alert and oriented x 3    EKG:   .    Recent Labs: 10/18/2021: ALT 19; TSH 0.43 06/02/2022: BUN 15; Creatinine, Ser 0.96; Hemoglobin 13.8; Platelets 246; Potassium 4.0; Sodium 134    Lipid Panel    Component Value Date/Time   CHOL 178 06/13/2021 1058   TRIG 183 (H) 06/13/2021 1058   HDL 51 06/13/2021 1058   CHOLHDL 3.5 06/13/2021 1058   CHOLHDL 3.2 05/14/2019 0730   VLDL 21 05/14/2019 0730   LDLCALC 96 06/13/2021 1058   LDLDIRECT 111.0 04/28/2019 1138      Wt Readings from Last 3 Encounters:  08/02/22 134 lb 6.4 oz (61 kg)  06/02/22 130 lb (59 kg)  03/15/22 130 lb (59 kg)      Other studies Reviewed: Additional studies/ records that were reviewed today include: . Review of the above records demonstrates:    ASSESSMENT AND PLAN:  1. Coronary artery disease-she has known coronary artery disease.  She has not had any recent episodes of chest discomfort.  Continue current medications.   2. Intermittent atrial fibrillation -she is status post A-fib  ablation.  Continue Eliquis.  She remains in sinus rhythm.    3.  hyperlipidemia-she remains on Praluent and bempedoic acid.  Her LDL was 96 which is dramatically improved from her pretreatment time.  She eats a low fat low-cholesterol diet.  She will continue to see the lipid clinic intermittently.  Will check levels today.   4. Essential HTN:    Blood pressure is well-controlled today.     Current medicines are reviewed at length with the patient today.  The patient does not have concerns regarding medicines.  The following changes have been made:  no change  Labs/ tests ordered today include:   Orders Placed This Encounter  Procedures   Lipid Profile   ALT   Basic metabolic panel      Signed, [name], MD  08/02/2022 10:04  AM    [facility] [address], [place], [place]  [address] Phone: [phone]; Fax: [phone]

## 2022-08-02 ENCOUNTER — Ambulatory Visit: Payer: Medicare Other | Attending: Cardiovascular Disease | Admitting: Cardiovascular Disease

## 2022-08-02 ENCOUNTER — Telehealth: Payer: Self-pay | Admitting: Pharmacist

## 2022-08-02 ENCOUNTER — Encounter: Payer: Self-pay | Admitting: Cardiovascular Disease

## 2022-08-02 VITALS — BP 128/70 | HR 76 | Ht 60.0 in | Wt 134.4 lb

## 2022-08-02 DIAGNOSIS — E7800 Pure hypercholesterolemia, unspecified: Secondary | ICD-10-CM | POA: Insufficient documentation

## 2022-08-02 DIAGNOSIS — Z79899 Other long term (current) drug therapy: Secondary | ICD-10-CM | POA: Diagnosis not present

## 2022-08-02 DIAGNOSIS — H43812 Vitreous degeneration, left eye: Secondary | ICD-10-CM | POA: Diagnosis not present

## 2022-08-02 DIAGNOSIS — H43811 Vitreous degeneration, right eye: Secondary | ICD-10-CM | POA: Diagnosis not present

## 2022-08-02 DIAGNOSIS — H04123 Dry eye syndrome of bilateral lacrimal glands: Secondary | ICD-10-CM | POA: Diagnosis not present

## 2022-08-02 DIAGNOSIS — I25119 Atherosclerotic heart disease of native coronary artery with unspecified angina pectoris: Secondary | ICD-10-CM | POA: Diagnosis not present

## 2022-08-02 DIAGNOSIS — H4423 Degenerative myopia, bilateral: Secondary | ICD-10-CM | POA: Diagnosis not present

## 2022-08-02 NOTE — Telephone Encounter (Signed)
Re-enrolled in [place] ID [id number]

## 2022-08-02 NOTE — Patient Instructions (Signed)
Medication Instructions:   Your physician recommends that you continue on your current medications as directed. Please refer to the Current Medication list given to you today.  *If you need a refill on your cardiac medications before your next appointment, please call your pharmacy*   Lab Work:  TODAY--BMET, ALT, AND LIPIDS  If you have labs (blood work) drawn today and your tests are completely normal, you will receive your results only by: [place] (if you have MyChart) OR A paper copy in the mail If you have any lab test that is abnormal or we need to change your treatment, we will call you to review the results.    Follow-Up: At [facility], you and your health needs are our priority.  As part of our continuing mission to provide you with exceptional heart care, we have created designated Provider Care Teams.  These Care Teams include your primary Cardiologist (physician) and Advanced Practice Providers (APPs -  Physician Assistants and Nurse Practitioners) who all work together to provide you with the care you need, when you need it.  We recommend signing up for the patient portal called "MyChart".  Sign up information is provided on this After Visit Summary.  MyChart is used to connect with patients for Virtual Visits (Telemedicine).  Patients are able to view lab/test results, encounter notes, upcoming appointments, etc.  Non-urgent messages can be sent to your provider as well.   To learn more about what you can do with MyChart, go to [email or web address].    Your next appointment:   1 year(s)  Provider:   [name], MD

## 2022-08-03 LAB — BASIC METABOLIC PANEL
BUN/Creatinine Ratio: 18 (ref 12–28)
BUN: 17 mg/dL (ref 8–27)
CO2: 23 mmol/L (ref 20–29)
Calcium: 9.6 mg/dL (ref 8.7–10.3)
Chloride: 98 mmol/L (ref 96–106)
Creatinine, Ser: 0.92 mg/dL (ref 0.57–1.00)
Glucose: 80 mg/dL (ref 70–99)
Potassium: 4.5 mmol/L (ref 3.5–5.2)
Sodium: 137 mmol/L (ref 134–144)
eGFR: 65 mL/min/{1.73_m2} (ref >59)

## 2022-08-03 LAB — LIPID PANEL
Chol/HDL Ratio: 3.6 ratio (ref 0.0–4.4)
Cholesterol, Total: 176 mg/dL (ref 100–199)
HDL: 49 mg/dL (ref >39)
LDL Chol Calc (NIH): 94 mg/dL (ref 0–99)
Triglycerides: 194 mg/dL — ABNORMAL HIGH (ref 0–149)
VLDL Cholesterol Cal: 33 mg/dL (ref 5–40)

## 2022-08-03 LAB — ALT: ALT: 27 IU/L (ref 0–32)

## 2022-08-09 ENCOUNTER — Other Ambulatory Visit: Payer: Self-pay | Admitting: Cardiovascular Disease

## 2022-08-15 ENCOUNTER — Other Ambulatory Visit: Payer: Self-pay | Admitting: Cardiovascular Disease

## 2022-09-01 ENCOUNTER — Other Ambulatory Visit: Payer: Self-pay | Admitting: Cardiovascular Disease

## 2022-09-27 ENCOUNTER — Encounter: Payer: Self-pay | Admitting: Cardiovascular Disease

## 2022-10-09 ENCOUNTER — Other Ambulatory Visit: Payer: Self-pay | Admitting: Cardiovascular Disease

## 2022-10-26 ENCOUNTER — Other Ambulatory Visit: Payer: Self-pay | Admitting: Pharmacist

## 2022-10-26 DIAGNOSIS — I480 Paroxysmal atrial fibrillation: Secondary | ICD-10-CM

## 2022-10-26 MED ORDER — APIXABAN 5 MG PO TABS: ORAL_TABLET | ORAL | 1 refills | Status: DC

## 2022-10-26 NOTE — Telephone Encounter (Signed)
Prescription refill request for Eliquis received. Indication: PAF Last office visit: 08/02/22 Scr: 0.92 epic 08/02/22 Age: 77 Weight: 61kg

## 2022-11-23 ENCOUNTER — Ambulatory Visit (INDEPENDENT_AMBULATORY_CARE_PROVIDER_SITE_OTHER): Payer: Medicare Other | Admitting: Family Medicine

## 2022-11-23 VITALS — BP 122/72 | HR 69 | Temp 98.9°F | Ht 61.0 in | Wt 133.4 lb

## 2022-11-23 DIAGNOSIS — H9203 Otalgia, bilateral: Secondary | ICD-10-CM | POA: Diagnosis not present

## 2022-11-23 MED ORDER — FLUTICASONE PROPIONATE 50 MCG/ACT NA SUSP: 2.0000 | Freq: Every day | NASAL | 1 refills | Status: AC | PRN

## 2022-11-23 NOTE — Progress Notes (Signed)
Acute Office Visit   Subjective:  Patient ID: Shannon Cummings, female    DOB: [birth year], 77 y.o.   MRN: [id number]  Chief Complaint  Patient presents with   ear fluid    Pt reports she had a flight and ear fluid has built up Pt reports rt ear is more fluid Pt reports he head feels stuffy.    HPI Patient is 77 year old female that complains of fluid in her ears, more noticeable in the right ear, and head feels stuffy. Decreased hearing in right ear. Dry cough. She reports she had a flight on 06/07.   Denies any dizziness or lightheaded; fever, sore throat, and no current nasal drainage or congestion. Denies blurry vision.   Negative home covid test-last Friday.   She reports Zicam while on vacation. Antihistamine-last week. Both medications seemed to help.   Denies anybody that she knows that has been ill. Spouse does not have symptoms.  ROS See HPI above      Objective:   BP 122/72   Pulse 69   Temp 98.9 F (37.2 C)   Ht 5\' 1"  (1.549 m)   Wt 133 lb 6 oz (60.5 kg)   SpO2 99%   BMI 25.20 kg/m    Physical Exam Vitals reviewed.  Constitutional:      General: She is not in acute distress.    Appearance: Normal appearance. She is not ill-appearing, toxic-appearing or diaphoretic.  HENT:     Head: Normocephalic and atraumatic.     Right Ear: There is no impacted cerumen.     Left Ear: Tympanic membrane and external ear normal. There is no impacted cerumen.     Ears:     Comments: Right TM: Clear fluid behind TM with mild bulge.  Left ear canal: mild cerumen in canal.     Nose:     Right Sinus: No maxillary sinus tenderness or frontal sinus tenderness.     Left Sinus: No maxillary sinus tenderness or frontal sinus tenderness.     Mouth/Throat:     Pharynx: Oropharynx is clear. Uvula midline. No pharyngeal swelling, oropharyngeal exudate, posterior oropharyngeal erythema or uvula swelling.  Eyes:     General:        Right eye: No discharge.        Left eye: No  discharge.     Conjunctiva/sclera: Conjunctivae normal.  Cardiovascular:     Rate and Rhythm: Normal rate and regular rhythm.     Heart sounds: Normal heart sounds. No murmur heard.    No friction rub. No gallop.  Pulmonary:     Effort: Pulmonary effort is normal. No respiratory distress.     Breath sounds: Normal breath sounds.  Musculoskeletal:        General: Normal range of motion.  Lymphadenopathy:     Head:     Right side of head: No submental or submandibular adenopathy.     Left side of head: No submental or submandibular adenopathy.  Skin:    General: Skin is warm and dry.  Neurological:     General: No focal deficit present.     Mental Status: She is alert and oriented to person, place, and time. Mental status is at baseline.  Psychiatric:        Mood and Affect: Mood normal.        Behavior: Behavior normal.        Thought Content: Thought content normal.  Judgment: Judgment normal.       Assessment & Plan:  Otalgia of both ears -     Fluticasone Propionate; Place 2 sprays into both nostrils daily as needed for allergies or rhinitis.  Dispense: 48 g; Refill: 1  -Refilled Fluticasone Propionate-2 sprays into both nostrils every day for the few days. Recommend to do this in the morning.  -If not improved by Monday or Tuesday, please call back to the office. Then, will send in a steroid.  -She reports she has an appointment with ENT in July that she made for these symptoms. Advised to keep the appointment incase we can not get this improved.   Zandra Abts, NP

## 2022-11-23 NOTE — Patient Instructions (Signed)
It was a pleasure to care for you today. -Refilled Flonase that I would like to try every day for the next few days to see if this helps. No infection identified on physical exam.  -If not improved by Monday or Tuesday, please call back the office. Will then send in a steroid.

## 2022-12-01 ENCOUNTER — Encounter: Payer: Self-pay | Admitting: Family Medicine

## 2022-12-01 ENCOUNTER — Ambulatory Visit (INDEPENDENT_AMBULATORY_CARE_PROVIDER_SITE_OTHER): Payer: Medicare Other | Admitting: Family Medicine

## 2022-12-01 VITALS — BP 114/68 | HR 63 | Temp 97.6°F | Resp 17 | Ht 61.0 in | Wt 133.0 lb

## 2022-12-01 DIAGNOSIS — J329 Chronic sinusitis, unspecified: Secondary | ICD-10-CM | POA: Diagnosis not present

## 2022-12-01 DIAGNOSIS — B9689 Other specified bacterial agents as the cause of diseases classified elsewhere: Secondary | ICD-10-CM | POA: Diagnosis not present

## 2022-12-01 MED ORDER — AMOXICILLIN 875 MG PO TABS: 875.0000 mg | ORAL_TABLET | Freq: Two times a day (BID) | ORAL | 0 refills | Status: AC

## 2022-12-01 NOTE — Progress Notes (Signed)
   Subjective:    Patient ID: Shannon Cummings, female    DOB: [birth year], 77 y.o.   MRN: [id number]  HPI Ear pain- pt was seen 6/20 for bilateral ear pain.  This started after a 14 hr plane ride earlier in the month.  Has been using Flonase.  Yesterday developed sore throat, felt that glands were swollen.  Head feels full.  + PND.  + maxillary facial pain.  + upper tooth pain.  No fever.  Denies body aches.   Review of Systems For ROS see HPI     Objective:   Physical Exam Vitals reviewed.  Constitutional:      General: She is not in acute distress.    Appearance: Normal appearance. She is well-developed.  HENT:     Head: Normocephalic and atraumatic.     Right Ear: Tympanic membrane is retracted.     Left Ear: Tympanic membrane normal.     Nose: Mucosal edema present. No rhinorrhea.     Right Sinus: Maxillary sinus tenderness present. No frontal sinus tenderness.     Left Sinus: Maxillary sinus tenderness present. No frontal sinus tenderness.     Mouth/Throat:     Pharynx: Uvula midline. Posterior oropharyngeal erythema present. No oropharyngeal exudate.  Eyes:     Conjunctiva/sclera: Conjunctivae normal.     Pupils: Pupils are equal, round, and reactive to light.  Cardiovascular:     Rate and Rhythm: Normal rate and regular rhythm.     Heart sounds: Normal heart sounds.  Pulmonary:     Effort: Pulmonary effort is normal. No respiratory distress.     Breath sounds: Normal breath sounds. No wheezing.  Musculoskeletal:     Cervical back: Normal range of motion and neck supple.  Lymphadenopathy:     Cervical: No cervical adenopathy.  Skin:    General: Skin is warm and dry.  Neurological:     General: No focal deficit present.     Mental Status: She is alert and oriented to person, place, and time.     Cranial Nerves: No cranial nerve deficit.     Motor: No weakness.     Coordination: Coordination normal.  Psychiatric:        Mood and Affect: Mood normal.         Behavior: Behavior normal.        Thought Content: Thought content normal.           Assessment & Plan:   Bacterial sinusitis- new.  Pt's ongoing ear fullness was likely initially due to travel or viral illness but now w/ facial pain, tooth pain.  Start Amoxicillin 875mg  BID x10 days.  Reviewed supportive care and red flags that should prompt return.  Pt expressed understanding and is in agreement w/ plan.

## 2022-12-01 NOTE — Patient Instructions (Signed)
Follow up as needed or as scheduled START the Amoxicillin twice daily- take w/ food Drink LOTS of fluids Continue the nasal spray Call with any questions or concerns Stay Safe!  Stay Healthy! Hang in there!!!

## 2022-12-06 DIAGNOSIS — H838X3 Other specified diseases of inner ear, bilateral: Secondary | ICD-10-CM | POA: Diagnosis not present

## 2022-12-06 DIAGNOSIS — J310 Chronic rhinitis: Secondary | ICD-10-CM | POA: Diagnosis not present

## 2022-12-06 DIAGNOSIS — H903 Sensorineural hearing loss, bilateral: Secondary | ICD-10-CM | POA: Diagnosis not present

## 2022-12-06 DIAGNOSIS — J343 Hypertrophy of nasal turbinates: Secondary | ICD-10-CM | POA: Diagnosis not present

## 2022-12-06 DIAGNOSIS — J342 Deviated nasal septum: Secondary | ICD-10-CM | POA: Diagnosis not present

## 2022-12-06 DIAGNOSIS — H6983 Other specified disorders of Eustachian tube, bilateral: Secondary | ICD-10-CM | POA: Diagnosis not present

## 2022-12-22 ENCOUNTER — Encounter: Payer: Self-pay | Admitting: Cardiovascular Disease

## 2023-01-22 ENCOUNTER — Encounter (HOSPITAL_COMMUNITY): Payer: Self-pay | Admitting: Cardiology

## 2023-01-22 ENCOUNTER — Other Ambulatory Visit: Payer: Self-pay

## 2023-01-22 ENCOUNTER — Other Ambulatory Visit (HOSPITAL_COMMUNITY): Payer: Medicare Other

## 2023-01-22 ENCOUNTER — Inpatient Hospital Stay (HOSPITAL_COMMUNITY)
Admission: EM | Admit: 2023-01-22 | Discharge: 2023-01-24 | DRG: 281 | Disposition: A | Discharge status: Home / Self Care | Payer: Medicare Other | Attending: Internal Medicine | Admitting: Internal Medicine

## 2023-01-22 ENCOUNTER — Emergency Department (HOSPITAL_COMMUNITY): Payer: Medicare Other

## 2023-01-22 DIAGNOSIS — Z955 Presence of coronary angioplasty implant and graft: Secondary | ICD-10-CM

## 2023-01-22 DIAGNOSIS — Z820 Family history of epilepsy and other diseases of the nervous system: Secondary | ICD-10-CM | POA: Diagnosis not present

## 2023-01-22 DIAGNOSIS — I480 Paroxysmal atrial fibrillation: Secondary | ICD-10-CM | POA: Diagnosis present

## 2023-01-22 DIAGNOSIS — K219 Gastro-esophageal reflux disease without esophagitis: Secondary | ICD-10-CM | POA: Diagnosis not present

## 2023-01-22 DIAGNOSIS — E871 Hypo-osmolality and hyponatremia: Secondary | ICD-10-CM | POA: Diagnosis not present

## 2023-01-22 DIAGNOSIS — G4733 Obstructive sleep apnea (adult) (pediatric): Secondary | ICD-10-CM | POA: Diagnosis not present

## 2023-01-22 DIAGNOSIS — Z888 Allergy status to other drugs, medicaments and biological substances status: Secondary | ICD-10-CM | POA: Diagnosis not present

## 2023-01-22 DIAGNOSIS — E785 Hyperlipidemia, unspecified: Secondary | ICD-10-CM | POA: Diagnosis present

## 2023-01-22 DIAGNOSIS — I200 Unstable angina: Secondary | ICD-10-CM | POA: Diagnosis present

## 2023-01-22 DIAGNOSIS — R0989 Other specified symptoms and signs involving the circulatory and respiratory systems: Secondary | ICD-10-CM | POA: Diagnosis present

## 2023-01-22 DIAGNOSIS — I214 Non-ST elevation (NSTEMI) myocardial infarction: Secondary | ICD-10-CM | POA: Diagnosis not present

## 2023-01-22 DIAGNOSIS — R55 Syncope and collapse: Secondary | ICD-10-CM

## 2023-01-22 DIAGNOSIS — I493 Ventricular premature depolarization: Secondary | ICD-10-CM | POA: Diagnosis not present

## 2023-01-22 DIAGNOSIS — Z8249 Family history of ischemic heart disease and other diseases of the circulatory system: Secondary | ICD-10-CM | POA: Diagnosis not present

## 2023-01-22 DIAGNOSIS — I255 Ischemic cardiomyopathy: Secondary | ICD-10-CM | POA: Diagnosis present

## 2023-01-22 DIAGNOSIS — R918 Other nonspecific abnormal finding of lung field: Secondary | ICD-10-CM | POA: Diagnosis not present

## 2023-01-22 DIAGNOSIS — I252 Old myocardial infarction: Secondary | ICD-10-CM | POA: Diagnosis not present

## 2023-01-22 DIAGNOSIS — I10 Essential (primary) hypertension: Secondary | ICD-10-CM | POA: Diagnosis present

## 2023-01-22 DIAGNOSIS — I2510 Atherosclerotic heart disease of native coronary artery without angina pectoris: Secondary | ICD-10-CM | POA: Diagnosis present

## 2023-01-22 DIAGNOSIS — I472 Ventricular tachycardia, unspecified: Secondary | ICD-10-CM | POA: Diagnosis present

## 2023-01-22 DIAGNOSIS — R0789 Other chest pain: Principal | ICD-10-CM

## 2023-01-22 DIAGNOSIS — Z7901 Long term (current) use of anticoagulants: Secondary | ICD-10-CM | POA: Diagnosis not present

## 2023-01-22 DIAGNOSIS — Z79899 Other long term (current) drug therapy: Secondary | ICD-10-CM | POA: Diagnosis not present

## 2023-01-22 DIAGNOSIS — R001 Bradycardia, unspecified: Secondary | ICD-10-CM | POA: Diagnosis present

## 2023-01-22 DIAGNOSIS — R079 Chest pain, unspecified: Secondary | ICD-10-CM | POA: Diagnosis not present

## 2023-01-22 LAB — COMPREHENSIVE METABOLIC PANEL
ALT: 25 U/L (ref 0–44)
AST: 41 U/L (ref 15–41)
Albumin: 4 g/dL (ref 3.5–5.0)
Alkaline Phosphatase: 57 U/L (ref 38–126)
Anion gap: 13 (ref 5–15)
BUN: 17 mg/dL (ref 8–23)
CO2: 21 mmol/L — ABNORMAL LOW (ref 22–32)
Calcium: 8.9 mg/dL (ref 8.9–10.3)
Chloride: 98 mmol/L (ref 98–111)
Creatinine, Ser: 0.87 mg/dL (ref 0.44–1.00)
GFR, Estimated: >60 mL/min (ref >60)
Glucose, Bld: 120 mg/dL — ABNORMAL HIGH (ref 70–99)
Potassium: 3.7 mmol/L (ref 3.5–5.1)
Sodium: 132 mmol/L — ABNORMAL LOW (ref 135–145)
Total Bilirubin: 1.2 mg/dL (ref 0.3–1.2)
Total Protein: 7.1 g/dL (ref 6.5–8.1)

## 2023-01-22 LAB — CBC WITH DIFFERENTIAL/PLATELET
Abs Immature Granulocytes: 0.03 10*3/uL (ref 0.00–0.07)
Basophils Absolute: 0 10*3/uL (ref 0.0–0.1)
Basophils Relative: 0 %
Eosinophils Absolute: 0.1 10*3/uL (ref 0.0–0.5)
Eosinophils Relative: 1 %
HCT: 42.4 % (ref 36.0–46.0)
Hemoglobin: 14.4 g/dL (ref 12.0–15.0)
Immature Granulocytes: 0 %
Lymphocytes Relative: 48 %
Lymphs Abs: 4.3 10*3/uL — ABNORMAL HIGH (ref 0.7–4.0)
MCH: 30.1 pg (ref 26.0–34.0)
MCHC: 34 g/dL (ref 30.0–36.0)
MCV: 88.7 fL (ref 80.0–100.0)
Monocytes Absolute: 0.6 10*3/uL (ref 0.1–1.0)
Monocytes Relative: 7 %
Neutro Abs: 4 10*3/uL (ref 1.7–7.7)
Neutrophils Relative %: 44 %
Platelets: 299 10*3/uL (ref 150–400)
RBC: 4.78 MIL/uL (ref 3.87–5.11)
RDW: 13.2 % (ref 11.5–15.5)
WBC: 9.1 10*3/uL (ref 4.0–10.5)
nRBC: 0 % (ref 0.0–0.2)

## 2023-01-22 LAB — HEMOGLOBIN A1C
Hgb A1c MFr Bld: 5.6 % (ref 4.8–5.6)
Mean Plasma Glucose: 114.02 mg/dL

## 2023-01-22 LAB — TROPONIN I (HIGH SENSITIVITY)
Troponin I (High Sensitivity): 22 ng/L — ABNORMAL HIGH (ref <18)
Troponin I (High Sensitivity): 6 ng/L (ref <18)
Troponin I (High Sensitivity): 1995 ng/L (ref <18)
Troponin I (High Sensitivity): 1771 ng/L (ref <18)

## 2023-01-22 LAB — CBG MONITORING, ED: Glucose-Capillary: 119 mg/dL — ABNORMAL HIGH (ref 70–99)

## 2023-01-22 LAB — MAGNESIUM: Magnesium: 1.8 mg/dL (ref 1.7–2.4)

## 2023-01-22 LAB — TSH: TSH: 9.927 u[IU]/mL — ABNORMAL HIGH (ref 0.350–4.500)

## 2023-01-22 LAB — HEPARIN LEVEL (UNFRACTIONATED): Heparin Unfractionated: >1.1 [IU]/mL — ABNORMAL HIGH (ref 0.30–0.70)

## 2023-01-22 LAB — APTT: aPTT: 33 seconds (ref 24–36)

## 2023-01-22 MED ORDER — ASPIRIN 81 MG PO TBEC
81.0000 mg | DELAYED_RELEASE_TABLET | Freq: Every day | ORAL | Status: DC
  Administered 2023-01-23 – 2023-01-24 (×2): 81 mg via ORAL
  Filled 2023-01-22 (×2): qty 1

## 2023-01-22 MED ORDER — HYDROMORPHONE HCL 1 MG/ML IJ SOLN
0.5000 mg | Freq: Once | INTRAMUSCULAR | Status: AC
  Administered 2023-01-22: 0.5 mg via INTRAVENOUS
  Filled 2023-01-22: qty 1

## 2023-01-22 MED ORDER — METOCLOPRAMIDE HCL 5 MG/ML IJ SOLN
5.0000 mg | Freq: Once | INTRAMUSCULAR | Status: AC
  Administered 2023-01-22: 5 mg via INTRAVENOUS
  Filled 2023-01-22: qty 2

## 2023-01-22 MED ORDER — ASPIRIN 300 MG RE SUPP: 300.0000 mg | RECTAL | Status: AC

## 2023-01-22 MED ORDER — MORPHINE SULFATE (PF) 4 MG/ML IV SOLN
4.0000 mg | Freq: Once | INTRAVENOUS | Status: AC
  Administered 2023-01-22: 4 mg via INTRAVENOUS
  Filled 2023-01-22: qty 1

## 2023-01-22 MED ORDER — NITROGLYCERIN 0.4 MG SL SUBL: 0.4000 mg | SUBLINGUAL_TABLET | SUBLINGUAL | Status: DC | PRN

## 2023-01-22 MED ORDER — SODIUM CHLORIDE 0.9 % IV SOLN: INTRAVENOUS | Status: DC

## 2023-01-22 MED ORDER — NITROGLYCERIN IN D5W 200-5 MCG/ML-% IV SOLN
2.0000 ug/min | INTRAVENOUS | Status: DC
  Administered 2023-01-22: 5 ug/min via INTRAVENOUS
  Filled 2023-01-22: qty 250

## 2023-01-22 MED ORDER — NITROGLYCERIN 0.4 MG SL SUBL
0.4000 mg | SUBLINGUAL_TABLET | SUBLINGUAL | Status: DC | PRN
  Administered 2023-01-22: 0.4 mg via SUBLINGUAL
  Filled 2023-01-22: qty 1

## 2023-01-22 MED ORDER — HEPARIN (PORCINE) 25000 UT/250ML-% IV SOLN
900.0000 [IU]/h | INTRAVENOUS | Status: DC
  Administered 2023-01-22: 750 [IU]/h via INTRAVENOUS
  Filled 2023-01-22: qty 250

## 2023-01-22 MED ORDER — KETOROLAC TROMETHAMINE 15 MG/ML IJ SOLN
15.0000 mg | Freq: Once | INTRAMUSCULAR | Status: AC
  Administered 2023-01-22: 15 mg via INTRAVENOUS
  Filled 2023-01-22: qty 1

## 2023-01-22 MED ORDER — METOPROLOL TARTRATE 25 MG PO TABS
25.0000 mg | ORAL_TABLET | Freq: Two times a day (BID) | ORAL | Status: DC
  Administered 2023-01-22 – 2023-01-23 (×2): 25 mg via ORAL
  Filled 2023-01-22 (×3): qty 1

## 2023-01-22 MED ORDER — ASPIRIN 81 MG PO CHEW
324.0000 mg | CHEWABLE_TABLET | ORAL | Status: AC
  Administered 2023-01-22: 324 mg via ORAL
  Filled 2023-01-22: qty 4

## 2023-01-22 MED ORDER — ACETAMINOPHEN 325 MG PO TABS: 650.0000 mg | ORAL_TABLET | ORAL | Status: DC | PRN

## 2023-01-22 MED ORDER — NITROGLYCERIN 2 % TD OINT
1.0000 [in_us] | TOPICAL_OINTMENT | Freq: Three times a day (TID) | TRANSDERMAL | Status: DC
  Administered 2023-01-22: 1 [in_us] via TOPICAL
  Filled 2023-01-22: qty 1

## 2023-01-22 MED ORDER — ONDANSETRON HCL 4 MG/2ML IJ SOLN
4.0000 mg | Freq: Four times a day (QID) | INTRAMUSCULAR | Status: DC | PRN
  Administered 2023-01-22: 4 mg via INTRAVENOUS
  Filled 2023-01-22: qty 2

## 2023-01-22 NOTE — ED Notes (Signed)
Cards notified of patient c/o chest pain via phone

## 2023-01-22 NOTE — ED Notes (Signed)
Patient continues to be resting, no s/s of any distress. VSS

## 2023-01-22 NOTE — ED Provider Notes (Signed)
[place] EMERGENCY DEPARTMENT AT [facility] Provider Note   CSN: [id number] Arrival date & time: 01/22/23  1222     History {Add pertinent medical, surgical, social history, OB history to HPI:1} Chief Complaint  Patient presents with   Chest Pain    Shannon Cummings is a 77 y.o. female.  Patient complains of severe chest discomfort.  She has a history of coronary artery disease   Chest Pain      Home Medications Prior to Admission medications   Medication Sig Start Date End Date Taking? Authorizing Provider  acetaminophen (TYLENOL) 650 MG CR tablet Take 650 mg by mouth every 8 (eight) hours as needed for pain.    [provider]  Alirocumab (PRALUENT) 75 MG/ML SOAJ Inject 75 mg into the skin every 14 (fourteen) days. 05/04/22   Nahser, Deloris Ping, MD  apixaban (ELIQUIS) 5 MG TABS tablet TAKE 1 TABLET(5 MG) BY MOUTH TWICE DAILY 10/26/22   Nahser, Deloris Ping, MD  Azelaic Acid 15 % gel Apply topically daily. 05/24/21   [provider]  Bempedoic Acid (NEXLETOL) 180 MG TABS TAKE 1 TABLET BY MOUTH DAILY 08/09/22   Nahser, Deloris Ping, MD  Cholecalciferol (VITAMIN D3) 50 MCG (2000 UT) capsule Take 2,000 Units by mouth daily.    [provider]  fluticasone (FLONASE) 50 MCG/ACT nasal spray Place 2 sprays into both nostrils daily as needed for allergies or rhinitis. 11/23/22   Alveria Apley, NP  losartan (COZAAR) 25 MG tablet TAKE 2 TABLETS(50 MG) BY MOUTH DAILY 08/15/22   Nahser, Deloris Ping, MD  nitroGLYCERIN (NITROSTAT) 0.4 MG SL tablet Place 1 tablet (0.4 mg total) under the tongue every 5 (five) minutes as needed for chest pain. 05/22/22   Nahser, Deloris Ping, MD  Polyethyl Glycol-Propyl Glycol (SYSTANE) 0.4-0.3 % SOLN Place 1 drop into both eyes 3 (three) times daily.    [provider]  potassium chloride (KLOR-CON) 10 MEQ tablet TAKE 1 TABLET(10 MEQ) BY MOUTH DAILY 08/15/22   Nahser, Deloris Ping, MD  propranolol (INDERAL) 10 MG tablet TAKE 1  TABLET BY MOUTH 4 TIMES DAILY AS NEEDED FOR A FIB ATTACKS 10/09/22   Nahser, Deloris Ping, MD  spironolactone (ALDACTONE) 25 MG tablet TAKE 1 TABLET BY MOUTH EVERY OTHER DAY 09/01/22   Nahser, Deloris Ping, MD      Allergies    Effexor [venlafaxine], Flomax [tamsulosin], Isosorbide, Macrobid [nitrofurantoin], Other, B-12 compliance injection [cyanocobalamin], Statins, and Zetia [ezetimibe]    Review of Systems   Review of Systems  Cardiovascular:  Positive for chest pain.    Physical Exam Updated Vital Signs BP (!) 145/89   Pulse 65   Temp 97.8 F (36.6 C)   Resp (!) 22   SpO2 98%  Physical Exam  ED Results / Procedures / Treatments   Labs (all labs ordered are listed, but only abnormal results are displayed) Labs Reviewed  CBC WITH DIFFERENTIAL/PLATELET - Abnormal; Notable for the following components:      Result Value   Lymphs Abs 4.3 (*)    All other components within normal limits  CBG MONITORING, ED - Abnormal; Notable for the following components:   Glucose-Capillary 119 (*)    All other components within normal limits  TROPONIN I (HIGH SENSITIVITY) - Abnormal; Notable for the following components:   Troponin I (High Sensitivity) 22 (*)    All other components within normal limits  COMPREHENSIVE METABOLIC PANEL  MAGNESIUM  TSH  HEMOGLOBIN A1C  TROPONIN I (  HIGH SENSITIVITY)    EKG None  Radiology DG Chest Port 1 View  Result Date: 01/22/2023 CLINICAL DATA:  77 year old female with chest pain EXAM: PORTABLE CHEST 1 VIEW COMPARISON:  06/02/2022 FINDINGS: Cardiomediastinal silhouette unchanged in size and contour. No evidence of central vascular congestion. No interlobular septal thickening. Low lung volumes. Defibrillator pads on the left chest. Reticular opacities at the left lung base. No pneumothorax or pleural effusion. Coarsened interstitial markings, with no confluent airspace disease. No acute displaced fracture. IMPRESSION: Reticular opacities at the left lung base,  potentially atelectasis/scarring versus atypical infection. Defibrillator pads project over the left chest Electronically Signed   By: Gilmer Mor D.O.   On: 01/22/2023 15:31    Procedures Procedures  {Document cardiac monitor, telemetry assessment procedure when appropriate:1}  Medications Ordered in ED Medications  nitroGLYCERIN (NITROSTAT) SL tablet 0.4 mg (0.4 mg Sublingual Given 01/22/23 1416)  ketorolac (TORADOL) 15 MG/ML injection 15 mg (15 mg Intravenous Given 01/22/23 1304)  morphine (PF) 4 MG/ML injection 4 mg (4 mg Intravenous Given 01/22/23 1416)    ED Course/ Medical Decision Making/ A&P   {   Click here for ABCD2, HEART and other calculatorsREFRESH Note before signing :1}                              Medical Decision Making Amount and/or Complexity of Data Reviewed Labs: ordered. Radiology: ordered. ECG/medicine tests: ordered.  Risk Prescription drug management. Decision regarding hospitalization.   Patient with mildly elevated troponin and chest pain.  She is admitted to cardiology  {Document critical care time when appropriate:1} {Document review of labs and clinical decision tools ie heart score, Chads2Vasc2 etc:1}  {Document your independent review of radiology images, and any outside records:1} {Document your discussion with family members, caretakers, and with consultants:1} {Document social determinants of health affecting pt's care:1} {Document your decision making why or why not admission, treatments were needed:1} Final Clinical Impression(s) / ED Diagnoses Final diagnoses:  Atypical chest pain    Rx / DC Orders ED Discharge Orders     None

## 2023-01-22 NOTE — ED Notes (Signed)
Patient cont. To c/o nausea without vomiting. MD made aware

## 2023-01-22 NOTE — ED Triage Notes (Signed)
Pt c/o midsternal chest pain that radiates to upper back and SOB. Pt went unresponsive as we were asking pt questions, but never lost pulse.

## 2023-01-22 NOTE — ED Notes (Signed)
Suleiman MD made aware of Trop 1,995

## 2023-01-22 NOTE — ED Notes (Signed)
Patient in bed resting with eyes closed, no s/s of any distress. No verbal c/o chest pain or discomfort. VSS.

## 2023-01-22 NOTE — H&P (Signed)
Cardiology Consultation   Patient ID: Shannon Cummings MRN: [id number]; DOB: May 11, 1946  Admit date: 01/22/2023 Date of Consult: 01/22/2023  PCP:  Sheliah Hatch, MD   [facility] Providers Cardiologist:  Kristeen Miss, MD  Electrophysiologist:  Hillis Range, MD (Inactive)      CC:  chest pain  syncope  Patient Profile:   Shannon Cummings is a 77 y.o. female with a hx of CAD with prior stenting, PAF, HLD. HTN, HLD  who is being seen 01/22/2023 for the evaluation of chest pain at the request of Dr. Estell Cummings.  History of Present Illness:   Shannon Cummings with hx as above with stent to pLAD in 2014 and NSTEMI 2020 with stent to 1st OM and prior open stent to LAD. Last cath 05/2019 with stable stents.  EF 45-50%.   She had atrial fib ablation 07/2020 for PAF. She is on eliquis with CHA2DS2VASc of 4.  She has propranolol prn.    Was in ER 08/02/22 with chest pain, feelings of near syncope and troponins normal.  She is on Praluent and bempedoic acid for HLD follows with lipid clinic (intolerant to statins and zetia).   Recently her BP has been <100 systolic at home so she stopped her losartan 50 mg daily.  She has kept eye on BP and has been controlled.  Today it was elevated on arrival.    Today pt presented to ER with midsternal chest pain with radiation to upper back and SOB. Some nausea, some SOB no nausea.   She became unresponsive while being checked in.  Continues with chest pain. Pt's pulse with syncope was palpable and steady.   Pain was 9-10/10 now 2-3/10 the morphine has helped but the NTG has not.  She did take 10 mg of propranolol prior to arrival.   Pain came on quickly today- she has been stressed with her husband who had CVA and CEA recently.    BP 168/140 on arrival >>167/93>>145/89>>131/84  she continued with chest pain.     She has rec'd toradol and morphine and NTG but after the syncope   CBG 119  WBC 9.1 Hgb 14.4 plts 299  Hs troponin 6  second pending CMP  pending PCXR Reticular opacities at the left lung base, potentially atelectasis/scarring versus atypical infection.     EKG:  The EKG was personally reviewed and demonstrates:  first SR 80 no acute ST changes.  Second EKG with SR and bigeminy PVCs.  No ST changes  Third SR with some PVCs  fourth SR with early transition,   Telemetry:  Telemetry was personally reviewed and demonstrates:  SR     Past Medical History:  Diagnosis Date   Arthritis    CAD (coronary artery disease)    a. prior stenting history. b. NSTEMI s/p DES to prox LAD with EF 45% by cath 08/14/12. c. Mild trop elevation shortly after cath ?mild plaque embolization - patent stent on relook. // d. Myoview 8/18: EF 60, normal perfusion; Low Risk. e. NSTEMI 06/2018- patent prior LAD stent, 80% OM1 s/p DES, normal LVEDP, EF 45-50%, moderate residual disease treated medically.   Fatty infiltration of liver    GERD (gastroesophageal reflux disease)    Hyperlipidemia    Hypertension    Hypokalemia    Hypotension    Myocardial infarction (HCC) 2014   OSA on CPAP    Osteoporosis    PAF (paroxysmal atrial fibrillation) (HCC)    On rythmol previously  Pulmonary nodule    a. 8mm subpleural nodular density CT 08/2012, instructed to f/u pulm MD.   Sinus bradycardia    Tubular adenoma of colon 2007    Past Surgical History:  Procedure Laterality Date   ATRIAL FIBRILLATION ABLATION N/A 07/27/2020   Procedure: ATRIAL FIBRILLATION ABLATION;  Surgeon: Hillis Range, MD;  Location: [facility] INVASIVE CV LAB;  Service: Cardiovascular;  Laterality: N/A;   COLONOSCOPY  03/07/2016   COLONOSCOPY WITH PROPOFOL  07/04/2021   CORONARY ANGIOPLASTY WITH STENT PLACEMENT  08/14/2012   LAD  Dr Tonny Bollman   CORONARY STENT INTERVENTION N/A 06/14/2018   Procedure: CORONARY STENT INTERVENTION;  Surgeon: Corky Crafts, MD;  Location: [facility] INVASIVE CV LAB;  Service: Cardiovascular;  Laterality: N/A;   CORONARY STENT PLACEMENT  2000   hair  restoration  02/2017   LEFT HEART CATH AND CORONARY ANGIOGRAPHY N/A 06/14/2018   Procedure: LEFT HEART CATH AND CORONARY ANGIOGRAPHY;  Surgeon: Corky Crafts, MD;  Location: [facility] INVASIVE CV LAB;  Service: Cardiovascular;  Laterality: N/A;   LEFT HEART CATH AND CORONARY ANGIOGRAPHY N/A 05/14/2019   Procedure: LEFT HEART CATH AND CORONARY ANGIOGRAPHY;  Surgeon: Tonny Bollman, MD;  Location: [facility] INVASIVE CV LAB;  Service: Cardiovascular;  Laterality: N/A;   LEFT HEART CATHETERIZATION WITH CORONARY ANGIOGRAM N/A 08/14/2012   Procedure: LEFT HEART CATHETERIZATION WITH CORONARY ANGIOGRAM;  Surgeon: Tonny Bollman, MD;  Location: [facility] CATH LAB;  Service: Cardiovascular;  Laterality: N/A;   LEFT HEART CATHETERIZATION WITH CORONARY ANGIOGRAM N/A 08/19/2012   Procedure: LEFT HEART CATHETERIZATION WITH CORONARY ANGIOGRAM;  Surgeon: Tonny Bollman, MD;  Location: [facility] CATH LAB;  Service: Cardiovascular;  Laterality: N/A;   ROTATOR CUFF REPAIR     TONSILLECTOMY     TOTAL ABDOMINAL HYSTERECTOMY       Home Medications:  Prior to Admission medications   Medication Sig Start Date End Date Taking? Authorizing Provider  acetaminophen (TYLENOL) 650 MG CR tablet Take 650 mg by mouth every 8 (eight) hours as needed for pain.    [provider]  Alirocumab (PRALUENT) 75 MG/ML SOAJ Inject 75 mg into the skin every 14 (fourteen) days. 05/04/22   Nahser, Deloris Ping, MD  apixaban (ELIQUIS) 5 MG TABS tablet TAKE 1 TABLET(5 MG) BY MOUTH TWICE DAILY 10/26/22   Nahser, Deloris Ping, MD  Azelaic Acid 15 % gel Apply topically daily. 05/24/21   [provider]  Bempedoic Acid (NEXLETOL) 180 MG TABS TAKE 1 TABLET BY MOUTH DAILY 08/09/22   Nahser, Deloris Ping, MD  Cholecalciferol (VITAMIN D3) 50 MCG (2000 UT) capsule Take 2,000 Units by mouth daily.    [provider]  fluticasone (FLONASE) 50 MCG/ACT nasal spray Place 2 sprays into both nostrils daily as needed for allergies or rhinitis. 11/23/22   Alveria Apley, NP  losartan (COZAAR) 25 MG tablet TAKE 2 TABLETS(50 MG) BY MOUTH DAILY 08/15/22   Nahser, Deloris Ping, MD  nitroGLYCERIN (NITROSTAT) 0.4 MG SL tablet Place 1 tablet (0.4 mg total) under the tongue every 5 (five) minutes as needed for chest pain. 05/22/22   Nahser, Deloris Ping, MD  Polyethyl Glycol-Propyl Glycol (SYSTANE) 0.4-0.3 % SOLN Place 1 drop into both eyes 3 (three) times daily.    [provider]  potassium chloride (KLOR-CON) 10 MEQ tablet TAKE 1 TABLET(10 MEQ) BY MOUTH DAILY 08/15/22   Nahser, Deloris Ping, MD  propranolol (INDERAL) 10 MG tablet TAKE 1 TABLET BY MOUTH 4 TIMES DAILY AS NEEDED FOR A FIB ATTACKS 10/09/22  Nahser, Deloris Ping, MD  spironolactone (ALDACTONE) 25 MG tablet TAKE 1 TABLET BY MOUTH EVERY OTHER DAY 09/01/22   Nahser, Deloris Ping, MD    Inpatient Medications: Scheduled Meds:  Continuous Infusions:  PRN Meds: nitroGLYCERIN  Allergies:    Allergies  Allergen Reactions   Effexor [Venlafaxine] Other (See Comments)    CAUSED TOO MUCH SEDATION; CANNOT TOLERATE   Flomax [Tamsulosin] Other (See Comments)    Caused rapid HYPOTENSION- patient came very close to fainting (also caused weakness and vertigo)   Isosorbide     Headache   Macrobid [Nitrofurantoin] Diarrhea, Nausea Only and Other (See Comments)    Severe abd pain    Other Anxiety and Other (See Comments)    Intolerance to strong pain medications=  Make her feel anxious and feel like coming out of her skin   B-12 Compliance Injection [Cyanocobalamin] Other (See Comments)    headache   Statins Other (See Comments)    Restless legs, bad feeling.  This has occurred with Crestor 5 mg once weekly, Lipitor 10 mg twice weekly, and Simvastatin daily   Zetia [Ezetimibe] Other (See Comments)    Made the legs ache    Social History:   Social History   Socioeconomic History   Marital status: Married    Spouse name: Not on file   Number of children: 2   Years of education: Not on file   Highest education  level: Not on file  Occupational History   Occupation: Retired    Associate Professor: [employer]     Comment: works part time in [employer] store  Tobacco Use   Smoking status: Never   Smokeless tobacco: Never  Vaping Use   Vaping status: Never Used  Substance and Sexual Activity   Alcohol use: Yes    Alcohol/week: 1.0 standard drink of alcohol    Types: 1 Glasses of wine per week    Comment: 2 drinks daily    Drug use: No   Sexual activity: Not Currently  Other Topics Concern   Not on file  Social History Narrative   Daily caffeine    Lives in [place]   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   Social Determinants of Health   Financial Resource Strain: Low Risk  (03/15/2022)   Overall Financial Resource Strain (CARDIA)    Difficulty of Paying Living Expenses: Not hard at all  Food Insecurity: No Food Insecurity (03/15/2022)   Hunger Vital Sign    Worried About Running Out of Food in the Last Year: Never true    Ran Out of Food in the Last Year: Never true  Transportation Needs: No Transportation Needs (03/15/2022)   PRAPARE - Administrator, Civil Service (Medical): No    Lack of Transportation (Non-Medical): No  Physical Activity: Insufficiently Active (03/15/2022)   Exercise Vital Sign    Days of Exercise per Week: 3 days    Minutes of Exercise per Session: 30 min  Stress: No Stress Concern Present (03/15/2022)   [employer] of Occupational Health - Occupational Stress Questionnaire    Feeling of Stress : Not at all  Social Connections: Moderately Isolated (03/15/2022)   Social Connection and Isolation Panel [NHANES]    Frequency of Communication with Friends and Family: More than three times a week    Frequency of Social Gatherings with Friends and Family: More than three times a week    Attends Religious Services: Never    Database administrator or Organizations:  No    Attends Banker Meetings: Never    Marital Status: Married  Careers information officer Violence: Not At Risk (03/15/2022)   Humiliation, Afraid, Rape, and Kick questionnaire    Fear of Current or Ex-Partner: No    Emotionally Abused: No    Physically Abused: No    Sexually Abused: No    Family History:    Family History  Problem Relation Age of Onset   Heart disease Father    Alzheimer's disease Mother    Breast cancer Maternal Aunt    Colon cancer Neg Hx      ROS:  Please see the history of present illness.  General:no colds or fevers, no weight changes Skin:no rashes or ulcers HEENT:no blurred vision, no congestion CV:see HPI PUL:see HPI GI:no diarrhea constipation or melena, no indigestion hx GERD GU:no hematuria, no dysuria MS:no joint pain, no claudication Neuro:no syncope, no lightheadedness Endo:no diabetes, no thyroid disease Sleep apnea,   All other ROS reviewed and negative.     Physical Exam/Data:   Vitals:   01/22/23 1345 01/22/23 1400 01/22/23 1415 01/22/23 1430  BP: (!) 177/100 (!) 167/93 (!) 151/101 (!) 145/89  Pulse: 61 65 62 65  Resp:  13 (!) 22   Temp:      SpO2: 100% 100% 100% 98%   No intake or output data in the 24 hours ending 01/22/23 1539    12/01/2022    8:56 AM 11/23/2022   10:35 AM 08/02/2022    9:34 AM  Last 3 Weights  Weight (lbs) 133 lb 133 lb 6 oz 134 lb 6.4 oz  Weight (kg) 60.328 kg 60.499 kg 60.963 kg     There is no height or weight on file to calculate BMI.  General:  Well nourished, well developed, in no acute distress chest pain does continue HEENT: normal Neck: no JVD Vascular: Lt carotid bruits; Distal pulses 2+ bilaterally upper ext, lower ext Rt>Lt pedal pulses Cardiac:  normal S1, S2; RRR; no murmur gallup rub or click Lungs:  clear to auscultation bilaterally, no wheezing, rhonchi or rales  Abd: soft, nontender, no hepatomegaly  Ext: no edema Musculoskeletal:  No deformities, BUE and BLE strength normal and equal Skin: warm and dry  Neuro:  alert and oriented X 3 MAE follows commands, no  focal abnormalities noted Psych:  Normal affect   Relevant CV Studies: 05/14/23 cardiac cath  Non-stenotic Prox LAD lesion was previously treated. Ost 1st Diag lesion is 70% stenosed. Ost 1st Mrg lesion is 25% stenosed. Previously placed 1st Mrg drug eluting stent is widely patent. Balloon angioplasty was performed. RPDA lesion is 50% stenosed. The left ventricular ejection fraction is 45-50% by visual estimate. LV end diastolic pressure is normal.   1.  Short left main with no stenosis 2.  Patent LAD with continued patency of the stented segment in the proximal vessel, moderate stenosis at the ostium of the diagonal branch unchanged from previous studies 3.  Continued patency of the circumflex and OM stent 4.  Patent RCA with no obstructive disease, moderate small vessel stenosis in the right PDA 5.  Mild segmental LV dysfunction with anterolateral hypokinesis, consistent with previous studies   Recommendations: No change in coronary anatomy from previous heart catheterization study in January 2020.  Continue medical therapy.  Anticipate discharge later today.  Will add low-dose amlodipine.  Resume apixaban tomorrow morning at normal dosing schedule.  Discontinue clopidogrel in January 2021 when she is 12 months out from last  PCI procedure as previously planned.   Diagnostic Dominance: Right    ECHO 06/14/2018 Study Conclusions   - Left ventricle: The cavity size was normal. Systolic function was    mildly to moderately reduced. The estimated ejection fraction was    in the range of 40% to 45%. Hypokinesis of the    basal-midanterolateral and inferolateral myocardium; consistent    with ischemia or infarction in the distribution of the left    circumflex coronary artery. Doppler parameters are consistent    with abnormal left ventricular relaxation (grade 1 diastolic    dysfunction). Doppler parameters are consistent with elevated    mean left atrial filling pressure.  -  Pericardium, extracardiac: There was a left pleural effusion.   Laboratory Data:  High Sensitivity Troponin:   Recent Labs  Lab 01/22/23 1246 01/22/23 1323  TROPONINIHS 6 22*     ChemistryNo results for input(s): "NA", "K", "CL", "CO2", "GLUCOSE", "BUN", "CREATININE", "CALCIUM", "MG", "GFRNONAA", "GFRAA", "ANIONGAP" in the last 168 hours.  No results for input(s): "PROT", "ALBUMIN", "AST", "ALT", "ALKPHOS", "BILITOT" in the last 168 hours. Lipids No results for input(s): "CHOL", "TRIG", "HDL", "LABVLDL", "LDLCALC", "CHOLHDL" in the last 168 hours.  Hematology Recent Labs  Lab 01/22/23 1246  WBC 9.1  RBC 4.78  HGB 14.4  HCT 42.4  MCV 88.7  MCH 30.1  MCHC 34.0  RDW 13.2  PLT 299   Thyroid No results for input(s): "TSH", "FREET4" in the last 168 hours.  BNPNo results for input(s): "BNP", "PROBNP" in the last 168 hours.  DDimer No results for input(s): "DDIMER" in the last 168 hours.   Radiology/Studies:  DG Chest Port 1 View  Result Date: 01/22/2023 CLINICAL DATA:  77 year old female with chest pain EXAM: PORTABLE CHEST 1 VIEW COMPARISON:  06/02/2022 FINDINGS: Cardiomediastinal silhouette unchanged in size and contour. No evidence of central vascular congestion. No interlobular septal thickening. Low lung volumes. Defibrillator pads on the left chest. Reticular opacities at the left lung base. No pneumothorax or pleural effusion. Coarsened interstitial markings, with no confluent airspace disease. No acute displaced fracture. IMPRESSION: Reticular opacities at the left lung base, potentially atelectasis/scarring versus atypical infection. Defibrillator pads project over the left chest Electronically Signed   By: Gilmer Mor D.O.   On: 01/22/2023 15:31     Assessment and Plan:   Chest pain with neg troponin currently 2nd one 22  will repeat in 6 hours - EKG with no acute ST changes hx of MI, NSTEMI and stents to pLAD and 1st OM stable in 2020.  This pain is worse than with her  prior MIs.   With elevated BP consider chest CTA but may need cardiac cath tomorrow possibly with eliquis this AM--add ASA 81 mg, lopressor 25 BID, hold arb for now.  Add IV NTG or paste will defer to Dr. Rennis Golden.  Admit to progressive care.  Check Mg+ A1C,  Syncope EKG about the time with bigeminy PVCs waiting on CMP none now. 4 beats of NSVT, on tele awaiting CMP  CAD as above HLD on pravulent in tolerant to statin and zetia.  PAF with a fib ablation in 2022 and has maintained SR.  She is on eliquis for CHA2DS2VASc of 5--Hold eliquis last dose was this AM and add IV heparin if CT chest ok  Carotid bruit, will need dopplers once acute pain evaluated.    Risk Assessment/Risk Scores:     TIMI Risk Score for Unstable Angina or Non-ST Elevation MI:   The  patient's TIMI risk score is 5, which indicates a 26% risk of all cause mortality, new or recurrent myocardial infarction or need for urgent revascularization in the next 14 days.    CHA2DS2-VASc Score = 5   This indicates a 7.2% annual risk of stroke. The patient's score is based upon: CHF History: 0 HTN History: 1 Diabetes History: 0 Stroke History: 0 Vascular Disease History: 1 Age Score: 2 Gender Score: 1         For questions or updates, please contact  [facility] Please consult [email or web address] for contact info under    Signed, Nada Boozer, NP  01/22/2023 3:39 PM

## 2023-01-22 NOTE — ED Notes (Signed)
Patient now opening eyes and respond to staff when name is called. Patient able to follow verbal commands. Continues to c/o generalized chest pain and upper back pain. 8/10 pain

## 2023-01-22 NOTE — ED Notes (Signed)
Pt called out reporting crushing CP across entire chest for past 60-90 minutes. Writer pulled fresh EKG and gave to ED provider for review. Writer also messaged Attending physician Rennis Golden, MD) informing of situation and requesting pain medication.

## 2023-01-22 NOTE — ED Notes (Signed)
MD paged due to patient c/o chest pain 5/10.

## 2023-01-22 NOTE — Progress Notes (Addendum)
ANTICOAGULATION CONSULT NOTE - Initial Consult  Pharmacy Consult for heparin Indication: chest pain/ACS  Allergies  Allergen Reactions   Effexor [Venlafaxine] Other (See Comments)    CAUSED TOO MUCH SEDATION; CANNOT TOLERATE   Flomax [Tamsulosin] Other (See Comments)    Caused rapid HYPOTENSION- patient came very close to fainting (also caused weakness and vertigo)   Isosorbide     Headache   Macrobid [Nitrofurantoin] Diarrhea, Nausea Only and Other (See Comments)    Severe abd pain    Other Anxiety and Other (See Comments)    Intolerance to strong pain medications=  Make her feel anxious and feel like coming out of her skin   B-12 Compliance Injection [Cyanocobalamin] Other (See Comments)    headache   Statins Other (See Comments)    Restless legs, bad feeling.  This has occurred with Crestor 5 mg once weekly, Lipitor 10 mg twice weekly, and Simvastatin daily   Zetia [Ezetimibe] Other (See Comments)    Made the legs ache    Patient Measurements:   Heparin Dosing Weight: 60 kg  Vital Signs: Temp: 97.8 F (36.6 C) (08/19 1225) BP: 145/89 (08/19 1430) Pulse Rate: 65 (08/19 1430)  Labs: Recent Labs    01/22/23 1246 01/22/23 1323  HGB 14.4  --   HCT 42.4  --   PLT 299  --   TROPONINIHS 6 22*    CrCl cannot be calculated (Patient's most recent lab result is older than the maximum 21 days allowed.).   Medical History: Past Medical History:  Diagnosis Date   Arthritis    CAD (coronary artery disease)    a. prior stenting history. b. NSTEMI s/p DES to prox LAD with EF 45% by cath 08/14/12. c. Mild trop elevation shortly after cath ?mild plaque embolization - patent stent on relook. // d. Myoview 8/18: EF 60, normal perfusion; Low Risk. e. NSTEMI 06/2018- patent prior LAD stent, 80% OM1 s/p DES, normal LVEDP, EF 45-50%, moderate residual disease treated medically.   Fatty infiltration of liver    GERD (gastroesophageal reflux disease)    Hyperlipidemia    Hypertension     Hypokalemia    Hypotension    Myocardial infarction (HCC) 2014   OSA on CPAP    Osteoporosis    PAF (paroxysmal atrial fibrillation) (HCC)    On rythmol previously   Pulmonary nodule    a. 8mm subpleural nodular density CT 08/2012, instructed to f/u pulm MD.   Sinus bradycardia    Tubular adenoma of colon 2007    Medications:  (Not in a hospital admission)  Scheduled:   aspirin  324 mg Oral NOW   Or   aspirin  300 mg Rectal NOW   [START ON 01/23/2023] aspirin EC  81 mg Oral Daily   metoprolol tartrate  25 mg Oral BID   nitroGLYCERIN  1 inch Topical Q8H   Infusions:   sodium chloride      Assessment: 77 yo female presenting with chest pain. May require cardiac cath tomorrow. Hgb/plt wnl, troponins 22. No signs of bleeding noted. Pt on Eliquis PTA, last dose this AM around 1100. Baseline anti-Xa level and aPTT ordered. Pharmacy consulted to dose heparin for ACS.   Goal of Therapy:  Heparin level 0.3-0.7 units/ml aPTT 66-102 seconds Monitor platelets by anticoagulation protocol: Yes   Plan:  Start heparin 750 units/hr at 2300 8 hour anti-Xa level and aPTT  Daily anti-Xa level and aPTT until levels correlate Monitor CBC, anti-Xa level, and s/sx of  bleeding    Burak Dakin 01/22/2023,4:40 PM

## 2023-01-23 ENCOUNTER — Inpatient Hospital Stay (HOSPITAL_COMMUNITY): Payer: Medicare Other

## 2023-01-23 ENCOUNTER — Encounter (HOSPITAL_COMMUNITY): Payer: Self-pay | Admitting: Internal Medicine

## 2023-01-23 ENCOUNTER — Encounter (HOSPITAL_COMMUNITY)
Admission: EM | Disposition: A | Discharge status: Home / Self Care | Payer: Self-pay | Source: Home / Self Care | Attending: Internal Medicine

## 2023-01-23 DIAGNOSIS — I2510 Atherosclerotic heart disease of native coronary artery without angina pectoris: Secondary | ICD-10-CM | POA: Diagnosis not present

## 2023-01-23 DIAGNOSIS — R0989 Other specified symptoms and signs involving the circulatory and respiratory systems: Secondary | ICD-10-CM | POA: Diagnosis not present

## 2023-01-23 DIAGNOSIS — R079 Chest pain, unspecified: Secondary | ICD-10-CM

## 2023-01-23 DIAGNOSIS — I214 Non-ST elevation (NSTEMI) myocardial infarction: Secondary | ICD-10-CM | POA: Diagnosis not present

## 2023-01-23 HISTORY — PX: LEFT HEART CATH AND CORONARY ANGIOGRAPHY: CATH118249

## 2023-01-23 LAB — LIPID PANEL
Cholesterol: 150 mg/dL (ref 0–200)
HDL: 52 mg/dL (ref >40)
LDL Cholesterol: 83 mg/dL (ref 0–99)
Total CHOL/HDL Ratio: 2.9 ratio
Triglycerides: 77 mg/dL (ref <150)
VLDL: 15 mg/dL (ref 0–40)

## 2023-01-23 LAB — BASIC METABOLIC PANEL
Anion gap: 12 (ref 5–15)
BUN: 19 mg/dL (ref 8–23)
CO2: 22 mmol/L (ref 22–32)
Calcium: 8.5 mg/dL — ABNORMAL LOW (ref 8.9–10.3)
Chloride: 97 mmol/L — ABNORMAL LOW (ref 98–111)
Creatinine, Ser: 0.89 mg/dL (ref 0.44–1.00)
GFR, Estimated: >60 mL/min (ref >60)
Glucose, Bld: 117 mg/dL — ABNORMAL HIGH (ref 70–99)
Potassium: 4.1 mmol/L (ref 3.5–5.1)
Sodium: 131 mmol/L — ABNORMAL LOW (ref 135–145)

## 2023-01-23 LAB — ECHOCARDIOGRAM COMPLETE
AR max vel: 2.39 cm2
AV Area VTI: 2.77 cm2
AV Area mean vel: 2.6 cm2
AV Mean grad: 1 mmHg
AV Peak grad: 3.3 mmHg
Ao pk vel: 0.91 m/s
Area-P 1/2: 1.64 cm2
S' Lateral: 3.9 cm
Single Plane A4C EF: 58.3 %
Weight: 2116.42 [oz_av]

## 2023-01-23 LAB — APTT
aPTT: 44 s — ABNORMAL HIGH (ref 24–36)
aPTT: 28 seconds (ref 24–36)

## 2023-01-23 LAB — CBC
HCT: 39.5 % (ref 36.0–46.0)
Hemoglobin: 13.2 g/dL (ref 12.0–15.0)
MCH: 30.5 pg (ref 26.0–34.0)
MCHC: 33.4 g/dL (ref 30.0–36.0)
MCV: 91.2 fL (ref 80.0–100.0)
Platelets: 231 10*3/uL (ref 150–400)
RBC: 4.33 MIL/uL (ref 3.87–5.11)
RDW: 13 % (ref 11.5–15.5)
WBC: 6.2 10*3/uL (ref 4.0–10.5)
nRBC: 0 % (ref 0.0–0.2)

## 2023-01-23 LAB — HEPARIN LEVEL (UNFRACTIONATED): Heparin Unfractionated: >1.1 [IU]/mL — ABNORMAL HIGH (ref 0.30–0.70)

## 2023-01-23 SURGERY — LEFT HEART CATH AND CORONARY ANGIOGRAPHY
Anesthesia: LOCAL

## 2023-01-23 MED ORDER — SODIUM CHLORIDE 0.9 % IV SOLN: 250.0000 mL | INTRAVENOUS | Status: DC | PRN

## 2023-01-23 MED ORDER — ONDANSETRON HCL 4 MG/2ML IJ SOLN: 4.0000 mg | Freq: Four times a day (QID) | INTRAMUSCULAR | Status: DC | PRN

## 2023-01-23 MED ORDER — VERAPAMIL HCL 2.5 MG/ML IV SOLN
INTRAVENOUS | Status: AC
  Filled 2023-01-23: qty 2

## 2023-01-23 MED ORDER — HEPARIN (PORCINE) IN NACL 1000-0.9 UT/500ML-% IV SOLN
INTRAVENOUS | Status: DC | PRN
  Administered 2023-01-23 (×2): 500 mL

## 2023-01-23 MED ORDER — SODIUM CHLORIDE 0.9% FLUSH: 3.0000 mL | INTRAVENOUS | Status: DC | PRN

## 2023-01-23 MED ORDER — HEPARIN (PORCINE) 25000 UT/250ML-% IV SOLN
900.0000 [IU]/h | INTRAVENOUS | Status: DC
  Administered 2023-01-23: 900 [IU]/h via INTRAVENOUS
  Filled 2023-01-23: qty 250

## 2023-01-23 MED ORDER — SODIUM CHLORIDE 0.9 % WEIGHT BASED INFUSION: 3.0000 mL/kg/h | INTRAVENOUS | Status: DC

## 2023-01-23 MED ORDER — SODIUM CHLORIDE 0.9 % WEIGHT BASED INFUSION: 1.0000 mL/kg/h | INTRAVENOUS | Status: DC

## 2023-01-23 MED ORDER — LIDOCAINE HCL (PF) 1 % IJ SOLN
INTRAMUSCULAR | Status: DC | PRN
  Administered 2023-01-23: 2 mL

## 2023-01-23 MED ORDER — MIDAZOLAM HCL 2 MG/2ML IJ SOLN
INTRAMUSCULAR | Status: AC
  Filled 2023-01-23: qty 2

## 2023-01-23 MED ORDER — HYDRALAZINE HCL 20 MG/ML IJ SOLN: 10.0000 mg | INTRAMUSCULAR | Status: AC | PRN

## 2023-01-23 MED ORDER — LABETALOL HCL 5 MG/ML IV SOLN: 10.0000 mg | INTRAVENOUS | Status: AC | PRN

## 2023-01-23 MED ORDER — MAGNESIUM SULFATE 2 GM/50ML IV SOLN
2.0000 g | Freq: Once | INTRAVENOUS | Status: AC
  Administered 2023-01-23: 2 g via INTRAVENOUS
  Filled 2023-01-23: qty 50

## 2023-01-23 MED ORDER — ASPIRIN 81 MG PO CHEW: 81.0000 mg | CHEWABLE_TABLET | ORAL | Status: DC

## 2023-01-23 MED ORDER — POTASSIUM CHLORIDE CRYS ER 10 MEQ PO TBCR
10.0000 meq | EXTENDED_RELEASE_TABLET | Freq: Every day | ORAL | Status: DC
  Administered 2023-01-23 – 2023-01-24 (×2): 10 meq via ORAL
  Filled 2023-01-23 (×3): qty 1

## 2023-01-23 MED ORDER — SODIUM CHLORIDE 0.9 % IV SOLN: INTRAVENOUS | Status: AC

## 2023-01-23 MED ORDER — IOHEXOL 350 MG/ML SOLN
INTRAVENOUS | Status: DC | PRN
  Administered 2023-01-23: 55 mL

## 2023-01-23 MED ORDER — HEPARIN SODIUM (PORCINE) 1000 UNIT/ML IJ SOLN
INTRAMUSCULAR | Status: DC | PRN
  Administered 2023-01-23: 3000 [IU] via INTRAVENOUS

## 2023-01-23 MED ORDER — BEMPEDOIC ACID 180 MG PO TABS: 1.0000 | ORAL_TABLET | Freq: Every day | ORAL | Status: DC

## 2023-01-23 MED ORDER — ACETAMINOPHEN 325 MG PO TABS: 650.0000 mg | ORAL_TABLET | Freq: Three times a day (TID) | ORAL | Status: DC | PRN

## 2023-01-23 MED ORDER — LIDOCAINE HCL (PF) 1 % IJ SOLN
INTRAMUSCULAR | Status: AC
  Filled 2023-01-23: qty 30

## 2023-01-23 MED ORDER — FENTANYL CITRATE (PF) 100 MCG/2ML IJ SOLN
INTRAMUSCULAR | Status: DC | PRN
  Administered 2023-01-23: 25 ug via INTRAVENOUS

## 2023-01-23 MED ORDER — VITAMIN D3 25 MCG (1000 UNIT) PO TABS
2000.0000 [IU] | ORAL_TABLET | Freq: Every day | ORAL | Status: DC
  Administered 2023-01-23 – 2023-01-24 (×2): 2000 [IU] via ORAL
  Filled 2023-01-23 (×3): qty 2

## 2023-01-23 MED ORDER — SODIUM CHLORIDE 0.9% FLUSH
3.0000 mL | Freq: Two times a day (BID) | INTRAVENOUS | Status: DC
  Administered 2023-01-23 – 2023-01-24 (×2): 3 mL via INTRAVENOUS

## 2023-01-23 MED ORDER — PERFLUTREN LIPID MICROSPHERE
1.0000 mL | INTRAVENOUS | Status: DC | PRN
  Administered 2023-01-23: 2 mL via INTRAVENOUS

## 2023-01-23 MED ORDER — HEPARIN SODIUM (PORCINE) 1000 UNIT/ML IJ SOLN
INTRAMUSCULAR | Status: AC
  Filled 2023-01-23: qty 10

## 2023-01-23 MED ORDER — FENTANYL CITRATE (PF) 100 MCG/2ML IJ SOLN
INTRAMUSCULAR | Status: AC
  Filled 2023-01-23: qty 2

## 2023-01-23 MED ORDER — ACETAMINOPHEN 325 MG PO TABS: 650.0000 mg | ORAL_TABLET | ORAL | Status: DC | PRN

## 2023-01-23 MED ORDER — MIDAZOLAM HCL 2 MG/2ML IJ SOLN
INTRAMUSCULAR | Status: DC | PRN
  Administered 2023-01-23: 2 mg via INTRAVENOUS

## 2023-01-23 MED ORDER — VERAPAMIL HCL 2.5 MG/ML IV SOLN
INTRAVENOUS | Status: DC | PRN
  Administered 2023-01-23: 10 mL via INTRA_ARTERIAL

## 2023-01-23 MED ORDER — POLYVINYL ALCOHOL 1.4 % OP SOLN
1.0000 [drp] | Freq: Three times a day (TID) | OPHTHALMIC | Status: DC
  Administered 2023-01-23 – 2023-01-24 (×3): 1 [drp] via OPHTHALMIC
  Filled 2023-01-23 (×2): qty 15

## 2023-01-23 MED ORDER — ALUM & MAG HYDROXIDE-SIMETH 200-200-20 MG/5ML PO SUSP
15.0000 mL | ORAL | Status: DC | PRN
  Administered 2023-01-23: 15 mL via ORAL
  Filled 2023-01-23: qty 30

## 2023-01-23 MED ORDER — SPIRONOLACTONE 25 MG PO TABS
25.0000 mg | ORAL_TABLET | Freq: Every day | ORAL | Status: DC
  Administered 2023-01-23 – 2023-01-24 (×2): 25 mg via ORAL
  Filled 2023-01-23 (×2): qty 1

## 2023-01-23 SURGICAL SUPPLY — 10 items
CATH 5FR JL3.5 JR4 ANG PIG MP (CATHETERS) ×1
CATH INFINITI 5 FR AR1 MOD (CATHETERS) ×1
DEVICE RAD COMP TR BAND LRG (VASCULAR PRODUCTS) ×1
ELECT DEFIB PAD ADLT CADENCE (PAD) ×1
GLIDESHEATH SLEND SS 6F .021 (SHEATH) ×1
INQWIRE 1.5J .035X260CM (WIRE) ×1
PACK CARDIAC CATHETERIZATION (CUSTOM PROCEDURE TRAY) ×1
SET ATX-X65L (MISCELLANEOUS) ×1
SHEATH PROBE COVER 6X72 (BAG) ×1
TRANSDUCER W/STOPCOCK (MISCELLANEOUS) ×1

## 2023-01-23 NOTE — Plan of Care (Signed)

## 2023-01-23 NOTE — Progress Notes (Signed)
ANTICOAGULATION CONSULT NOTE  Pharmacy Consult for heparin Indication: chest pain/ACS  Allergies  Allergen Reactions   Effexor [Venlafaxine] Other (See Comments)    Sedation    Flomax [Tamsulosin] Other (See Comments)    Hypotension Near syncope Weakness Vertigo   Macrobid [Nitrofurantoin] Diarrhea, Nausea Only and Other (See Comments)    Severe abdominal pain   Other Anxiety and Other (See Comments)    Intolerance to strong pain medications=  Make her feel anxious and feel like coming out of her skin   B-12 Compliance Injection [Cyanocobalamin] Other (See Comments)    Headaches    Imdur [Isosorbide Nitrate] Other (See Comments)    Headaches    Statins Other (See Comments)    Restless legs Bad feeling  Symptoms occurred while taking Crestor 5mg  (once weekly), Lipitor 10mg  (twice weekly) and Simvastatin  (daily).   Zetia [Ezetimibe] Other (See Comments)    Myalgias    Patient Measurements: Weight: 60 kg (132 lb 4.4 oz) Heparin Dosing Weight: 60 kg  Vital Signs: BP: 105/64 (08/20 1430) Pulse Rate: 46 (08/20 1430)  Labs: Recent Labs    01/22/23 1246 01/22/23 1323 01/22/23 1925 01/22/23 2045 01/23/23 0624  HGB 14.4  --   --   --  13.2  HCT 42.4  --   --   --  39.5  PLT 299  --   --   --  231  APTT  --   --  33  --  44*  HEPARINUNFRC  --   --  >1.10*  --  >1.10*  CREATININE  --   --  0.87  --  0.89  TROPONINIHS 6 22* 1,995* 1,771*  --     Estimated Creatinine Clearance: 44.7 mL/min (by C-G formula based on SCr of 0.89 mg/dL).   Medical History: Past Medical History:  Diagnosis Date   Arthritis    CAD (coronary artery disease)    a. prior stenting history. b. NSTEMI s/p DES to prox LAD with EF 45% by cath 08/14/12. c. Mild trop elevation shortly after cath ?mild plaque embolization - patent stent on relook. // d. Myoview 8/18: EF 60, normal perfusion; Low Risk. e. NSTEMI 06/2018- patent prior LAD stent, 80% OM1 s/p DES, normal LVEDP, EF 45-50%, moderate residual  disease treated medically.   Fatty infiltration of liver    GERD (gastroesophageal reflux disease)    Hyperlipidemia    Hypertension    Hypokalemia    Hypotension    Myocardial infarction (HCC) 2014   OSA on CPAP    Osteoporosis    PAF (paroxysmal atrial fibrillation) (HCC)    On rythmol previously   Pulmonary nodule    a. 8mm subpleural nodular density CT 08/2012, instructed to f/u pulm MD.   Sinus bradycardia    Tubular adenoma of colon 2007    Medications:  Medications Prior to Admission  Medication Sig Dispense Refill Last Dose   acetaminophen (TYLENOL) 500 MG tablet Take 500 mg by mouth daily as needed for moderate pain, fever or headache.   01/21/2023   Alirocumab (PRALUENT) 75 MG/ML SOAJ Inject 75 mg into the skin every 14 (fourteen) days. 6 mL 3 Past Month   apixaban (ELIQUIS) 5 MG TABS tablet TAKE 1 TABLET(5 MG) BY MOUTH TWICE DAILY 180 tablet 1 01/22/2023 at 1045   Bempedoic Acid (NEXLETOL) 180 MG TABS TAKE 1 TABLET BY MOUTH DAILY 90 tablet 3 01/22/2023   Cholecalciferol (VITAMIN D-3 PO) Take 1 capsule by mouth daily.   01/22/2023  doxycycline (MONODOX) 50 MG capsule Take 50 mg by mouth every Monday, Wednesday, and Friday.   01/22/2023   fluticasone (FLONASE) 50 MCG/ACT nasal spray Place 2 sprays into both nostrils daily as needed for allergies or rhinitis. 48 g 1 01/21/2023   Multiple Vitamins-Minerals (MULTIVITAMIN WOMEN 50+) TABS Take 1 tablet by mouth daily.   01/21/2023   nitroGLYCERIN (NITROSTAT) 0.4 MG SL tablet Place 1 tablet (0.4 mg total) under the tongue every 5 (five) minutes as needed for chest pain. 25 tablet 3 01/22/2023   potassium chloride (KLOR-CON) 10 MEQ tablet TAKE 1 TABLET(10 MEQ) BY MOUTH DAILY 90 tablet 3 01/22/2023   propranolol (INDERAL) 10 MG tablet TAKE 1 TABLET BY MOUTH 4 TIMES DAILY AS NEEDED FOR A FIB ATTACKS 360 tablet 3 01/22/2023   Propylene Glycol (SYSTANE COMPLETE) 0.6 % SOLN Place 1 drop into both eyes 3 (three) times daily.   01/22/2023    spironolactone (ALDACTONE) 25 MG tablet TAKE 1 TABLET BY MOUTH EVERY OTHER DAY 90 tablet 3 01/22/2023   losartan (COZAAR) 25 MG tablet TAKE 2 TABLETS(50 MG) BY MOUTH DAILY (Patient not taking: Reported on 01/22/2023) 180 tablet 3 Not Taking   Scheduled:   [START ON 01/24/2023] aspirin  81 mg Oral Pre-Cath   [MAR Hold] aspirin EC  81 mg Oral Daily   [MAR Hold] metoprolol tartrate  25 mg Oral BID   Infusions:   sodium chloride Stopped (01/23/23 1137)   sodium chloride 75 mL/hr at 01/23/23 1338   sodium chloride     heparin Stopped (01/23/23 1228)   [MAR Hold] nitroGLYCERIN Stopped (01/22/23 2339)    Assessment: 77 YOF presenting with chest pain and elevated troponins. Cardiology consulted with plans for potential cath. On Eliquis PTA.  Heparin level >1.1 units/mL as expected given recent Eliquis use. aPTT 44 (subtherapeutic) on heparin 750 units/hr.  8/20 PM: s/p cath, heparin to resume 2hr after TR band removal (removed at 17:00 per RN).  Goal of Therapy:  Heparin level 0.3-0.7 units/ml aPTT 66-102 seconds Monitor platelets by anticoagulation protocol: Yes   Plan:  At 19:00. resume heparin drip at 900 units/hr Check aPTT in 8 hours Daily CBC, aPTT and heparin level daily  F/u resuming Eliquis - likely 8/21 per Cards  Loralee Pacas, PharmD, BCPS 01/23/2023, 2:51 PM  Please check AMION for all [facility] phone numbers After 10:00 PM, call Main Pharmacy [phone]

## 2023-01-23 NOTE — Plan of Care (Signed)
Problem: Education: Goal: Understanding of cardiac disease, CV risk reduction, and recovery process will improve 01/23/2023 2233 by Royetta Crochet, RN Outcome: Progressing 01/23/2023 2232 by Royetta Crochet, RN Outcome: Progressing Goal: Individualized Educational Video(s) 01/23/2023 2233 by Royetta Crochet, RN Outcome: Progressing 01/23/2023 2232 by Royetta Crochet, RN Outcome: Progressing   Problem: Activity: Goal: Ability to tolerate increased activity will improve 01/23/2023 2233 by Royetta Crochet, RN Outcome: Progressing 01/23/2023 2232 by Royetta Crochet, RN Outcome: Progressing   Problem: Cardiac: Goal: Ability to achieve and maintain adequate cardiovascular perfusion will improve 01/23/2023 2233 by Royetta Crochet, RN Outcome: Progressing 01/23/2023 2232 by Royetta Crochet, RN Outcome: Progressing   Problem: Health Behavior/Discharge Planning: Goal: Ability to safely manage health-related needs after discharge will improve 01/23/2023 2233 by Royetta Crochet, RN Outcome: Progressing 01/23/2023 2232 by Royetta Crochet, RN Outcome: Progressing   Problem: Education: Goal: Understanding of CV disease, CV risk reduction, and recovery process will improve 01/23/2023 2233 by Royetta Crochet, RN Outcome: Progressing 01/23/2023 2232 by Royetta Crochet, RN Outcome: Progressing Goal: Individualized Educational Video(s) 01/23/2023 2233 by Royetta Crochet, RN Outcome: Progressing 01/23/2023 2232 by Royetta Crochet, RN Outcome: Progressing   Problem: Activity: Goal: Ability to return to baseline activity level will improve 01/23/2023 2233 by Royetta Crochet, RN Outcome: Progressing 01/23/2023 2232 by Royetta Crochet, RN Outcome: Progressing   Problem: Cardiovascular: Goal: Ability to achieve and maintain adequate cardiovascular perfusion will improve 01/23/2023 2233 by Royetta Crochet, RN Outcome: Progressing 01/23/2023 2232 by Royetta Crochet, RN Outcome:  Progressing Goal: Vascular access site(s) Level 0-1 will be maintained 01/23/2023 2233 by Royetta Crochet, RN Outcome: Progressing 01/23/2023 2232 by Royetta Crochet, RN Outcome: Progressing   Problem: Health Behavior/Discharge Planning: Goal: Ability to safely manage health-related needs after discharge will improve 01/23/2023 2233 by Royetta Crochet, RN Outcome: Progressing 01/23/2023 2232 by Royetta Crochet, RN Outcome: Progressing   Problem: Education: Goal: Knowledge of General Education information will improve Description: Including pain rating scale, medication(s)/side effects and non-pharmacologic comfort measures 01/23/2023 2233 by Royetta Crochet, RN Outcome: Progressing 01/23/2023 2232 by Royetta Crochet, RN Outcome: Progressing   Problem: Health Behavior/Discharge Planning: Goal: Ability to manage health-related needs will improve 01/23/2023 2233 by Royetta Crochet, RN Outcome: Progressing 01/23/2023 2232 by Royetta Crochet, RN Outcome: Progressing   Problem: Clinical Measurements: Goal: Ability to maintain clinical measurements within normal limits will improve 01/23/2023 2233 by Royetta Crochet, RN Outcome: Progressing 01/23/2023 2232 by Royetta Crochet, RN Outcome: Progressing Goal: Will remain free from infection 01/23/2023 2233 by Royetta Crochet, RN Outcome: Progressing 01/23/2023 2232 by Royetta Crochet, RN Outcome: Progressing Goal: Diagnostic test results will improve 01/23/2023 2233 by Royetta Crochet, RN Outcome: Progressing 01/23/2023 2232 by Royetta Crochet, RN Outcome: Progressing Goal: Respiratory complications will improve 01/23/2023 2233 by Royetta Crochet, RN Outcome: Progressing 01/23/2023 2232 by Royetta Crochet, RN Outcome: Progressing Goal: Cardiovascular complication will be avoided 01/23/2023 2233 by Royetta Crochet, RN Outcome: Progressing 01/23/2023 2232 by Royetta Crochet, RN Outcome: Progressing   Problem:  Activity: Goal: Risk for activity intolerance will decrease 01/23/2023 2233 by Royetta Crochet, RN Outcome: Progressing 01/23/2023 2232 by Royetta Crochet, RN Outcome: Progressing   Problem: Nutrition: Goal: Adequate nutrition will be maintained 01/23/2023 2233 by Royetta Crochet, RN Outcome: Progressing 01/23/2023 2232 by Royetta Crochet, RN Outcome: Progressing  Problem: Coping: Goal: Level of anxiety will decrease 01/23/2023 2233 by Royetta Crochet, RN Outcome: Progressing 01/23/2023 2232 by Royetta Crochet, RN Outcome: Progressing   Problem: Elimination: Goal: Will not experience complications related to bowel motility 01/23/2023 2233 by Royetta Crochet, RN Outcome: Progressing 01/23/2023 2232 by Royetta Crochet, RN Outcome: Progressing Goal: Will not experience complications related to urinary retention 01/23/2023 2233 by Royetta Crochet, RN Outcome: Progressing 01/23/2023 2232 by Royetta Crochet, RN Outcome: Progressing   Problem: Pain Managment: Goal: General experience of comfort will improve 01/23/2023 2233 by Royetta Crochet, RN Outcome: Progressing 01/23/2023 2232 by Royetta Crochet, RN Outcome: Progressing   Problem: Safety: Goal: Ability to remain free from injury will improve 01/23/2023 2233 by Royetta Crochet, RN Outcome: Progressing 01/23/2023 2232 by Royetta Crochet, RN Outcome: Progressing   Problem: Skin Integrity: Goal: Risk for impaired skin integrity will decrease 01/23/2023 2233 by Royetta Crochet, RN Outcome: Progressing 01/23/2023 2232 by Royetta Crochet, RN Outcome: Progressing

## 2023-01-23 NOTE — Progress Notes (Addendum)
DAILY PROGRESS NOTE   Patient Name: Shannon Cummings Date of Encounter: 01/23/2023 Cardiologist: Kristeen Miss, MD  Chief Complaint   Chest pain early last evening  Patient Profile   Shannon Cummings is a 77 y.o. female with a hx of CAD with prior stenting, PAF, HLD. HTN, HLD  who is being seen 01/22/2023 for the evaluation of chest pain at the request of Dr. Estell Harpin.   Subjective   Shannon Cummings apparently had worsening chest pain last evening. This morning I noted multiple secure chat messages sent to me after I left the hospital. It does not appear the nursing staff attempted to contact the late rounder, evening call doc or overnight fellow until around 8:45. At that time, the fellow was notified of a significant troponin increase to 1995 (From 22) - no fellow notes are in the chart. Apparently nitropaste was stopped and IV nitroglycerin gtts was started, but it was discontinued this morning. Subsequent troponin decreased to 1771. The patient was upset as she says that  no one came in her room for several hours and she even called the hospital ER to ask them to send someone in. She is not reporting chest pain this morning. Plan was for LHC this afternoon after Eliquis washout. Labs today show TC 150, TG 77, HDL 52, LDL 83, LP(a) in process. BMET and CBC ok for cath, although there is a mild hyponatremia - sodium 131 - she is being pre-hydrated which should correct this. Magnesium was 1.8.   Objective   Vitals:   01/23/23 0445 01/23/23 0500 01/23/23 0545 01/23/23 0630  BP: 106/77 116/79 129/79 126/78  Pulse: (!) 49 (!) 48 (!) 49 (!) 50  Resp: 13 13 15 17   Temp:      TempSrc:      SpO2: 98% 99% 100% 100%    Intake/Output Summary (Last 24 hours) at 01/23/2023 0850 Last data filed at 01/22/2023 2341 Gross per 24 hour  Intake 3.25 ml  Output --  Net 3.25 ml   There were no vitals filed for this visit.  Physical Exam   General appearance: alert and no distress Neck: no carotid  bruit, no JVD, and thyroid not enlarged, symmetric, no tenderness/mass/nodules Lungs: clear to auscultation bilaterally Heart: regular rate and rhythm Abdomen: soft, non-tender; bowel sounds normal; no masses,  no organomegaly Extremities: extremities normal, atraumatic, no cyanosis or edema Pulses: 2+ and symmetric Skin: Skin color, texture, turgor normal. No rashes or lesions Neurologic: Grossly normal Psych: Pleasant  Inpatient Medications    Scheduled Meds:  [START ON 01/24/2023] aspirin  81 mg Oral Pre-Cath   aspirin EC  81 mg Oral Daily   metoprolol tartrate  25 mg Oral BID    Continuous Infusions:  sodium chloride 10 mL/hr at 01/22/23 2343   [START ON 01/24/2023] sodium chloride     Followed by   Melene Muller ON 01/24/2023] sodium chloride     heparin 900 Units/hr (01/23/23 0825)   magnesium sulfate bolus IVPB     nitroGLYCERIN Stopped (01/22/23 2339)    PRN Meds: acetaminophen, nitroGLYCERIN, ondansetron (ZOFRAN) IV   Labs   Results for orders placed or performed during the hospital encounter of 01/22/23 (from the past 48 hour(s))  CBG monitoring, ED     Status: Abnormal   Collection Time: 01/22/23 12:38 PM  Result Value Ref Range   Glucose-Capillary 119 (H) 70 - 99 mg/dL    Comment: Glucose reference range applies only to samples taken after fasting  for at least 8 hours.  CBC with Differential     Status: Abnormal   Collection Time: 01/22/23 12:46 PM  Result Value Ref Range   WBC 9.1 4.0 - 10.5 K/uL   RBC 4.78 3.87 - 5.11 MIL/uL   Hemoglobin 14.4 12.0 - 15.0 g/dL   HCT 16.1 09.6 - 04.5 %   MCV 88.7 80.0 - 100.0 fL   MCH 30.1 26.0 - 34.0 pg   MCHC 34.0 30.0 - 36.0 g/dL   RDW 40.9 81.1 - 91.4 %   Platelets 299 150 - 400 K/uL   nRBC 0.0 0.0 - 0.2 %   Neutrophils Relative % 44 %   Neutro Abs 4.0 1.7 - 7.7 K/uL   Lymphocytes Relative 48 %   Lymphs Abs 4.3 (H) 0.7 - 4.0 K/uL   Monocytes Relative 7 %   Monocytes Absolute 0.6 0.1 - 1.0 K/uL   Eosinophils Relative 1 %    Eosinophils Absolute 0.1 0.0 - 0.5 K/uL   Basophils Relative 0 %   Basophils Absolute 0.0 0.0 - 0.1 K/uL   Immature Granulocytes 0 %   Abs Immature Granulocytes 0.03 0.00 - 0.07 K/uL    Comment: Performed at [facility], [address] [place], [place] [address]  Troponin I (High Sensitivity)     Status: None   Collection Time: 01/22/23 12:46 PM  Result Value Ref Range   Troponin I (High Sensitivity) 6 <18 ng/L    Comment: (NOTE) Elevated high sensitivity troponin I (hsTnI) values and significant  changes across serial measurements may suggest ACS but many other  chronic and acute conditions are known to elevate hsTnI results.  Refer to the "Links" section for chest pain algorithms and additional  guidance. Performed at [facility], [address] [place], [place] [address]   Troponin I (High Sensitivity)     Status: Abnormal   Collection Time: 01/22/23  1:23 PM  Result Value Ref Range   Troponin I (High Sensitivity) 22 (H) <18 ng/L    Comment: (NOTE) Elevated high sensitivity troponin I (hsTnI) values and significant  changes across serial measurements may suggest ACS but many other  chronic and acute conditions are known to elevate hsTnI results.  Refer to the "Links" section for chest pain algorithms and additional  guidance. Performed at [facility], [address] [place], [place] [address]   Comprehensive metabolic panel     Status: Abnormal   Collection Time: 01/22/23  7:25 PM  Result Value Ref Range   Sodium 132 (L) 135 - 145 mmol/L   Potassium 3.7 3.5 - 5.1 mmol/L   Chloride 98 98 - 111 mmol/L   CO2 21 (L) 22 - 32 mmol/L   Glucose, Bld 120 (H) 70 - 99 mg/dL    Comment: Glucose reference range applies only to samples taken after fasting for at least 8 hours.   BUN 17 8 - 23 mg/dL   Creatinine, Ser 4.69 0.44 - 1.00 mg/dL   Calcium 8.9 8.9 - 62.9 mg/dL   Total Protein 7.1 6.5 - 8.1 g/dL   Albumin 4.0 3.5 - 5.0 g/dL   AST 41 15 - 41 U/L    ALT 25 0 - 44 U/L   Alkaline Phosphatase 57 38 - 126 U/L   Total Bilirubin 1.2 0.3 - 1.2 mg/dL   GFR, Estimated >52 >84 mL/min    Comment: (NOTE) Calculated using the CKD-EPI Creatinine Equation (2021)    Anion gap 13  5 - 15    Comment: Performed at [facility], [address], [place], [place] [address]  Magnesium     Status: None   Collection Time: 01/22/23  7:25 PM  Result Value Ref Range   Magnesium 1.8 1.7 - 2.4 mg/dL    Comment: Performed at [facility], [address], , [place] [address]  TSH     Status: Abnormal   Collection Time: 01/22/23  7:25 PM  Result Value Ref Range   TSH 9.927 (H) 0.350 - 4.500 uIU/mL    Comment: Performed by a 3rd Generation assay with a functional sensitivity of <=0.01 uIU/mL. Performed at [facility], [address] [place], [place] [address]   Hemoglobin A1c     Status: None   Collection Time: 01/22/23  7:25 PM  Result Value Ref Range   Hgb A1c MFr Bld 5.6 4.8 - 5.6 %    Comment: (NOTE) Pre diabetes:          5.7%-6.4%  Diabetes:              >6.4%  Glycemic control for   <7.0% adults with diabetes    Mean Plasma Glucose 114.02 mg/dL    Comment: Performed at [facility], [address] [place], [place] [address]  Heparin level (unfractionated)     Status: Abnormal   Collection Time: 01/22/23  7:25 PM  Result Value Ref Range   Heparin Unfractionated >1.10 (H) 0.30 - 0.70 IU/mL    Comment: (NOTE) The clinical reportable range upper limit is being lowered to >1.10 to align with the FDA approved guidance for the current laboratory assay.  If heparin results are below expected values, and patient dosage has  been confirmed, suggest follow up testing of antithrombin III levels. Performed at [facility], [address] [place], [place] [address]   APTT     Status: None   Collection Time: 01/22/23  7:25 PM  Result Value Ref Range   aPTT 33 24 - 36 seconds    Comment: Performed at [facility], [address], [place], [place] [address]  Troponin I (High Sensitivity)     Status: Abnormal   Collection Time: 01/22/23  7:25 PM  Result Value Ref Range   Troponin I (High Sensitivity) 1,995 (HH) <18 ng/L    Comment: CRITICAL RESULT CALLED TO, READ BACK BY AND VERIFIED WITH L. MYLES RN 01/22/23 @2107  BY J. WHITE (NOTE) Elevated high sensitivity troponin I (hsTnI) values and significant  changes across serial measurements may suggest ACS but many other  chronic and acute conditions are known to elevate hsTnI results.  Refer to the "Links" section for chest pain algorithms and additional  guidance. Performed at [facility], [address], [place], [place] [address]   Troponin I (High Sensitivity)     Status: Abnormal   Collection Time: 01/22/23  8:45 PM  Result Value Ref Range   Troponin I (High Sensitivity) 1,771 (HH) <18 ng/L    Comment: CRITICAL VALUE NOTED. VALUE IS CONSISTENT WITH PREVIOUSLY REPORTED/CALLED VALUE (NOTE) Elevated high sensitivity troponin I (hsTnI) values and significant  changes across serial measurements may suggest ACS but many other  chronic and acute conditions are known to elevate hsTnI results.  Refer to the "Links" section for chest pain algorithms and additional  guidance. Performed at [facility], [address], [place], [place] [address]   Lipid panel  Status: None   Collection Time: 01/23/23  4:31 AM  Result Value Ref Range   Cholesterol 150 0 - 200 mg/dL   Triglycerides 77 <098 mg/dL   HDL 52 >11 mg/dL   Total CHOL/HDL Ratio 2.9 RATIO   VLDL 15 0 - 40 mg/dL   LDL Cholesterol 83 0 - 99 mg/dL    Comment:        Total Cholesterol/HDL:CHD Risk Coronary Heart Disease Risk Table                     Men   Women  1/2 Average Risk   3.4   3.3  Average Risk       5.0   4.4  2 X Average Risk   9.6   7.1  3 X Average Risk  23.4   11.0        Use the calculated Patient Ratio above and the CHD Risk Table to determine the  patient's CHD Risk.        ATP III CLASSIFICATION (LDL):  <100     mg/dL   Optimal  914-782  mg/dL   Near or Above                    Optimal  130-159  mg/dL   Borderline  956-213  mg/dL   High  >086     mg/dL   Very High Performed at [facility], [address] [place], [place] [address]   Basic metabolic panel     Status: Abnormal   Collection Time: 01/23/23  6:24 AM  Result Value Ref Range   Sodium 131 (L) 135 - 145 mmol/L   Potassium 4.1 3.5 - 5.1 mmol/L   Chloride 97 (L) 98 - 111 mmol/L   CO2 22 22 - 32 mmol/L   Glucose, Bld 117 (H) 70 - 99 mg/dL    Comment: Glucose reference range applies only to samples taken after fasting for at least 8 hours.   BUN 19 8 - 23 mg/dL   Creatinine, Ser 9.62 0.44 - 1.00 mg/dL   Calcium 8.5 (L) 8.9 - 10.3 mg/dL   GFR, Estimated >95 >28 mL/min    Comment: (NOTE) Calculated using the CKD-EPI Creatinine Equation (2021)    Anion gap 12 5 - 15    Comment: Performed at [facility], [address] [place], [place] [address]  CBC     Status: None   Collection Time: 01/23/23  6:24 AM  Result Value Ref Range   WBC 6.2 4.0 - 10.5 K/uL   RBC 4.33 3.87 - 5.11 MIL/uL   Hemoglobin 13.2 12.0 - 15.0 g/dL   HCT 40.1 02.7 - 25.3 %   MCV 91.2 80.0 - 100.0 fL   MCH 30.5 26.0 - 34.0 pg   MCHC 33.4 30.0 - 36.0 g/dL   RDW 66.4 40.3 - 47.4 %   Platelets 231 150 - 400 K/uL   nRBC 0.0 0.0 - 0.2 %    Comment: Performed at [facility], [address], [place], [place] [address]  Heparin level (unfractionated)     Status: Abnormal   Collection Time: 01/23/23  6:24 AM  Result Value Ref Range   Heparin Unfractionated >1.10 (H) 0.30 - 0.70 IU/mL    Comment: (NOTE) The clinical reportable range upper limit is being lowered to >1.10 to align with the FDA approved guidance for the current laboratory assay.  If heparin results  are below expected values, and patient dosage has  been confirmed, suggest follow up testing of antithrombin III  levels. Performed at [facility], [address], [place], [place] [address]   APTT     Status: Abnormal   Collection Time: 01/23/23  6:24 AM  Result Value Ref Range   aPTT 44 (H) 24 - 36 seconds    Comment:        IF BASELINE aPTT IS ELEVATED, SUGGEST PATIENT RISK ASSESSMENT BE USED TO DETERMINE APPROPRIATE ANTICOAGULANT THERAPY. Performed at [facility], [address], [place], [place] [address]     ECG   N/A  Telemetry   Sinus bradycardia - Personally Reviewed  Radiology    DG Chest Port 1 View  Result Date: 01/22/2023 CLINICAL DATA:  77 year old female with chest pain EXAM: PORTABLE CHEST 1 VIEW COMPARISON:  06/02/2022 FINDINGS: Cardiomediastinal silhouette unchanged in size and contour. No evidence of central vascular congestion. No interlobular septal thickening. Low lung volumes. Defibrillator pads on the left chest. Reticular opacities at the left lung base. No pneumothorax or pleural effusion. Coarsened interstitial markings, with no confluent airspace disease. No acute displaced fracture. IMPRESSION: Reticular opacities at the left lung base, potentially atelectasis/scarring versus atypical infection. Defibrillator pads project over the left chest Electronically Signed   By: Gilmer Mor D.O.   On: 01/22/2023 15:31    Cardiac Studies   N/A  Assessment   Principal Problem:   Non-ST elevation (NSTEMI) myocardial infarction [facility]) Active Problems:   Essential hypertension   Sinus bradycardia   Ischemic cardiomyopathy   Plan   Currently chest pain free, but NSTEMI, probably completed last evening - troponin peaked and declined.  Labs ok for cath today - being prehydrated - plan was this afternoon d/t Eliquis yesterday morning, but may go earlier at provider discretion. Will file a safety zone portal as this case should go to peer review. With ongoing chest pain last night and elevated troponins, would have been appropriate in my opinion to go to the  cath lab at that time.  CARDIAC CATHETERIZATION CONSENT  Performing MD:  Dr. Eldridge Dace  Procedure:  Left heart catheterization, possible percutaneous intevention  The procedure with Risks/Benefits/Alternatives and Indications was reviewed with the patient  and her husband.  All questions were answered.    Risks / Complications include, but not limited to: Death, MI, CVA/TIA, VF/VT (with defibrillation), Bradycardia (need for temporary pacer placement), contrast induced nephropathy, bleeding / bruising / hematoma / pseudoaneurysm, vascular or coronary injury (with possible emergent CT or Vascular Surgery), adverse medication reactions, infection.    The patient (and family) voice understanding and agree to proceed.   I have signed the consent form and placed it on the chart for patient signature and RN witness.    Chrystie Nose, MD, [facility], FACP  [place]  [facility]  Medical Director of the Advanced Lipid Disorders &  Cardiovascular Risk Reduction Clinic Diplomate of the American Board of Clinical Lipidology Attending Cardiologist  Direct Dial: [phone]  Fax: [phone]  Website:  [email or web address]  Chrystie Nose 01/23/2023, 8:54 AM     Time Spent Directly with Patient:  I have spent a total of 35 minutes with the patient reviewing hospital notes, telemetry, EKGs, labs and examining the patient as well as establishing an assessment and plan that was discussed personally with the patient.  > 50% of time was spent in direct patient care.  Length of Stay:  LOS: 1 day  Chrystie Nose, MD, [facility], FACP    [facility]  Medical Director of the Advanced Lipid Disorders &  Cardiovascular Risk Reduction Clinic Diplomate of the American Board of Clinical Lipidology Attending Cardiologist  Direct Dial: [phone]  Fax: [phone]  Website:  [email or web address] Nicely Bradlee Bridgers 01/23/2023, 8:50 AM

## 2023-01-23 NOTE — Progress Notes (Signed)
ANTICOAGULATION CONSULT NOTE  Pharmacy Consult for heparin Indication: chest pain/ACS  Allergies  Allergen Reactions   Effexor [Venlafaxine] Other (See Comments)    Sedation    Flomax [Tamsulosin] Other (See Comments)    Hypotension Near syncope Weakness Vertigo   Macrobid [Nitrofurantoin] Diarrhea, Nausea Only and Other (See Comments)    Severe abdominal pain   Other Anxiety and Other (See Comments)    Intolerance to strong pain medications=  Make her feel anxious and feel like coming out of her skin   B-12 Compliance Injection [Cyanocobalamin] Other (See Comments)    Headaches    Imdur [Isosorbide Nitrate] Other (See Comments)    Headaches    Statins Other (See Comments)    Restless legs Bad feeling  Symptoms occurred while taking Crestor 5mg  (once weekly), Lipitor 10mg  (twice weekly) and Simvastatin  (daily).   Zetia [Ezetimibe] Other (See Comments)    Myalgias    Patient Measurements:   Heparin Dosing Weight: 60 kg  Vital Signs: Temp: 97.6 F (36.4 C) (08/19 2345) Temp Source: Oral (08/19 2345) BP: 126/78 (08/20 0630) Pulse Rate: 50 (08/20 0630)  Labs: Recent Labs    01/22/23 1246 01/22/23 1323 01/22/23 1925 01/22/23 2045 01/23/23 0624  HGB 14.4  --   --   --  13.2  HCT 42.4  --   --   --  39.5  PLT 299  --   --   --  231  APTT  --   --  33  --  44*  HEPARINUNFRC  --   --  >1.10*  --  >1.10*  CREATININE  --   --  0.87  --  0.89  TROPONINIHS 6 22* 1,995* 1,771*  --     CrCl cannot be calculated (Unknown ideal weight.).   Medical History: Past Medical History:  Diagnosis Date   Arthritis    CAD (coronary artery disease)    a. prior stenting history. b. NSTEMI s/p DES to prox LAD with EF 45% by cath 08/14/12. c. Mild trop elevation shortly after cath ?mild plaque embolization - patent stent on relook. // d. Myoview 8/18: EF 60, normal perfusion; Low Risk. e. NSTEMI 06/2018- patent prior LAD stent, 80% OM1 s/p DES, normal LVEDP, EF 45-50%, moderate  residual disease treated medically.   Fatty infiltration of liver    GERD (gastroesophageal reflux disease)    Hyperlipidemia    Hypertension    Hypokalemia    Hypotension    Myocardial infarction (HCC) 2014   OSA on CPAP    Osteoporosis    PAF (paroxysmal atrial fibrillation) (HCC)    On rythmol previously   Pulmonary nodule    a. 8mm subpleural nodular density CT 08/2012, instructed to f/u pulm MD.   Sinus bradycardia    Tubular adenoma of colon 2007    Medications:  (Not in a hospital admission)  Scheduled:   aspirin EC  81 mg Oral Daily   metoprolol tartrate  25 mg Oral BID   Infusions:   sodium chloride 10 mL/hr at 01/22/23 2343   heparin 750 Units/hr (01/22/23 2233)   nitroGLYCERIN Stopped (01/22/23 2339)    Assessment: 77 YOF presenting with chest pain and elevated troponins. Cardiology consulted with plans for potential cath. On Eliquis PTA.  Heparin level >1.1 units/mL as expected given recent Eliquis use. aPTT 44 (subtherapeutic) on heparin 750 units/hr.  Goal of Therapy:  Heparin level 0.3-0.7 units/ml aPTT 66-102 seconds Monitor platelets by anticoagulation protocol: Yes   Plan:  Increase heparin to 900 units/hr Check aPTT in 8 hours Daily CBC and heparin level daily   Eldridge Scot, PharmD, BCCCP Clinical Pharmacist 01/23/2023, 8:16 AM

## 2023-01-23 NOTE — ED Notes (Signed)
Patient was placed on the bedpan to void. Patient is now resting in bed.

## 2023-01-23 NOTE — Interval H&P Note (Signed)
Cath Lab Visit (complete for each Cath Lab visit)  Clinical Evaluation Leading to the Procedure:   ACS: Yes.    Non-ACS:    Anginal Classification: CCS IV  Anti-ischemic medical therapy: Minimal Therapy (1 class of medications)  Non-Invasive Test Results: No non-invasive testing performed  Prior CABG: No previous CABG      History and Physical Interval Note:  01/23/2023 12:47 PM  Shannon Cummings  has presented today for surgery, with the diagnosis of nstemi.  The various methods of treatment have been discussed with the patient and family. After consideration of risks, benefits and other options for treatment, the patient has consented to  Procedure(s): LEFT HEART CATH AND CORONARY ANGIOGRAPHY (N/A) as a surgical intervention.  The patient's history has been reviewed, patient examined, no change in status, stable for surgery.  I have reviewed the patient's chart and labs.  Questions were answered to the patient's satisfaction.     Lance Muss

## 2023-01-23 NOTE — H&P (View-Only) (Signed)
DAILY PROGRESS NOTE   Patient Name: Shannon Cummings Date of Encounter: 01/23/2023 Cardiologist: Kristeen Miss, MD  Chief Complaint   Chest pain early last evening  Patient Profile   Shannon Cummings is a 77 y.o. female with a hx of CAD with prior stenting, PAF, HLD. HTN, HLD  who is being seen 01/22/2023 for the evaluation of chest pain at the request of Dr. Estell Harpin.   Subjective   [name] apparently had worsening chest pain last evening. This morning I noted multiple secure chat messages sent to me after I left the hospital. It does not appear the nursing staff attempted to contact the late rounder, evening call doc or overnight fellow until around 8:45. At that time, the fellow was notified of a significant troponin increase to 1995 (From 22) - no fellow notes are in the chart. Apparently nitropaste was stopped and IV nitroglycerin gtts was started, but it was discontinued this morning. Subsequent troponin decreased to 1771. The patient was upset as she says that  no one came in her room for several hours and she even called the hospital ER to ask them to send someone in. She is not reporting chest pain this morning. Plan was for LHC this afternoon after Eliquis washout. Labs today show TC 150, TG 77, HDL 52, LDL 83, LP(a) in process. BMET and CBC ok for cath, although there is a mild hyponatremia - sodium 131 - she is being pre-hydrated which should correct this. Magnesium was 1.8.   Objective   Vitals:   01/23/23 0445 01/23/23 0500 01/23/23 0545 01/23/23 0630  BP: 106/77 116/79 129/79 126/78  Pulse: (!) 49 (!) 48 (!) 49 (!) 50  Resp: 13 13 15 17   Temp:      TempSrc:      SpO2: 98% 99% 100% 100%    Intake/Output Summary (Last 24 hours) at 01/23/2023 0850 Last data filed at 01/22/2023 2341 Gross per 24 hour  Intake 3.25 ml  Output --  Net 3.25 ml   There were no vitals filed for this visit.  Physical Exam   General appearance: alert and no distress Neck: no carotid  bruit, no JVD, and thyroid not enlarged, symmetric, no tenderness/mass/nodules Lungs: clear to auscultation bilaterally Heart: regular rate and rhythm Abdomen: soft, non-tender; bowel sounds normal; no masses,  no organomegaly Extremities: extremities normal, atraumatic, no cyanosis or edema Pulses: 2+ and symmetric Skin: Skin color, texture, turgor normal. No rashes or lesions Neurologic: Grossly normal Psych: Pleasant  Inpatient Medications    Scheduled Meds:  [START ON 01/24/2023] aspirin  81 mg Oral Pre-Cath   aspirin EC  81 mg Oral Daily   metoprolol tartrate  25 mg Oral BID    Continuous Infusions:  sodium chloride 10 mL/hr at 01/22/23 2343   [START ON 01/24/2023] sodium chloride     Followed by   Melene Muller ON 01/24/2023] sodium chloride     heparin 900 Units/hr (01/23/23 0825)   magnesium sulfate bolus IVPB     nitroGLYCERIN Stopped (01/22/23 2339)    PRN Meds: acetaminophen, nitroGLYCERIN, ondansetron (ZOFRAN) IV   Labs   Results for orders placed or performed during the hospital encounter of 01/22/23 (from the past 48 hour(s))  CBG monitoring, ED     Status: Abnormal   Collection Time: 01/22/23 12:38 PM  Result Value Ref Range   Glucose-Capillary 119 (H) 70 - 99 mg/dL    Comment: Glucose reference range applies only to samples taken after fasting  for at least 8 hours.  CBC with Differential     Status: Abnormal   Collection Time: 01/22/23 12:46 PM  Result Value Ref Range   WBC 9.1 4.0 - 10.5 K/uL   RBC 4.78 3.87 - 5.11 MIL/uL   Hemoglobin 14.4 12.0 - 15.0 g/dL   HCT 16.1 09.6 - 04.5 %   MCV 88.7 80.0 - 100.0 fL   MCH 30.1 26.0 - 34.0 pg   MCHC 34.0 30.0 - 36.0 g/dL   RDW 40.9 81.1 - 91.4 %   Platelets 299 150 - 400 K/uL   nRBC 0.0 0.0 - 0.2 %   Neutrophils Relative % 44 %   Neutro Abs 4.0 1.7 - 7.7 K/uL   Lymphocytes Relative 48 %   Lymphs Abs 4.3 (H) 0.7 - 4.0 K/uL   Monocytes Relative 7 %   Monocytes Absolute 0.6 0.1 - 1.0 K/uL   Eosinophils Relative 1 %    Eosinophils Absolute 0.1 0.0 - 0.5 K/uL   Basophils Relative 0 %   Basophils Absolute 0.0 0.0 - 0.1 K/uL   Immature Granulocytes 0 %   Abs Immature Granulocytes 0.03 0.00 - 0.07 K/uL    Comment: Performed at [facility], [address] [place], [place] [address]  Troponin I (High Sensitivity)     Status: None   Collection Time: 01/22/23 12:46 PM  Result Value Ref Range   Troponin I (High Sensitivity) 6 <18 ng/L    Comment: (NOTE) Elevated high sensitivity troponin I (hsTnI) values and significant  changes across serial measurements may suggest ACS but many other  chronic and acute conditions are known to elevate hsTnI results.  Refer to the "Links" section for chest pain algorithms and additional  guidance. Performed at [facility], [address] [place], [place] [address]   Troponin I (High Sensitivity)     Status: Abnormal   Collection Time: 01/22/23  1:23 PM  Result Value Ref Range   Troponin I (High Sensitivity) 22 (H) <18 ng/L    Comment: (NOTE) Elevated high sensitivity troponin I (hsTnI) values and significant  changes across serial measurements may suggest ACS but many other  chronic and acute conditions are known to elevate hsTnI results.  Refer to the "Links" section for chest pain algorithms and additional  guidance. Performed at [facility], [address] [place], [place] [address]   Comprehensive metabolic panel     Status: Abnormal   Collection Time: 01/22/23  7:25 PM  Result Value Ref Range   Sodium 132 (L) 135 - 145 mmol/L   Potassium 3.7 3.5 - 5.1 mmol/L   Chloride 98 98 - 111 mmol/L   CO2 21 (L) 22 - 32 mmol/L   Glucose, Bld 120 (H) 70 - 99 mg/dL    Comment: Glucose reference range applies only to samples taken after fasting for at least 8 hours.   BUN 17 8 - 23 mg/dL   Creatinine, Ser 4.69 0.44 - 1.00 mg/dL   Calcium 8.9 8.9 - 62.9 mg/dL   Total Protein 7.1 6.5 - 8.1 g/dL   Albumin 4.0 3.5 - 5.0 g/dL   AST 41 15 - 41 U/L    ALT 25 0 - 44 U/L   Alkaline Phosphatase 57 38 - 126 U/L   Total Bilirubin 1.2 0.3 - 1.2 mg/dL   GFR, Estimated >52 >84 mL/min    Comment: (NOTE) Calculated using the CKD-EPI Creatinine Equation (2021)    Anion gap 13  5 - 15    Comment: Performed at [facility], [address], [place], [place] [address]  Magnesium     Status: None   Collection Time: 01/22/23  7:25 PM  Result Value Ref Range   Magnesium 1.8 1.7 - 2.4 mg/dL    Comment: Performed at [facility], [address], , [place] [address]  TSH     Status: Abnormal   Collection Time: 01/22/23  7:25 PM  Result Value Ref Range   TSH 9.927 (H) 0.350 - 4.500 uIU/mL    Comment: Performed by a 3rd Generation assay with a functional sensitivity of <=0.01 uIU/mL. Performed at [facility], [address] [place], [place] [address]   Hemoglobin A1c     Status: None   Collection Time: 01/22/23  7:25 PM  Result Value Ref Range   Hgb A1c MFr Bld 5.6 4.8 - 5.6 %    Comment: (NOTE) Pre diabetes:          5.7%-6.4%  Diabetes:              >6.4%  Glycemic control for   <7.0% adults with diabetes    Mean Plasma Glucose 114.02 mg/dL    Comment: Performed at [facility], [address] [place], [place] [address]  Heparin level (unfractionated)     Status: Abnormal   Collection Time: 01/22/23  7:25 PM  Result Value Ref Range   Heparin Unfractionated >1.10 (H) 0.30 - 0.70 IU/mL    Comment: (NOTE) The clinical reportable range upper limit is being lowered to >1.10 to align with the FDA approved guidance for the current laboratory assay.  If heparin results are below expected values, and patient dosage has  been confirmed, suggest follow up testing of antithrombin III levels. Performed at [facility], [address] [place], [place] [address]   APTT     Status: None   Collection Time: 01/22/23  7:25 PM  Result Value Ref Range   aPTT 33 24 - 36 seconds    Comment: Performed at [facility], [address], [place], [place] [address]  Troponin I (High Sensitivity)     Status: Abnormal   Collection Time: 01/22/23  7:25 PM  Result Value Ref Range   Troponin I (High Sensitivity) 1,995 (HH) <18 ng/L    Comment: CRITICAL RESULT CALLED TO, READ BACK BY AND VERIFIED WITH L. MYLES RN 01/22/23 @2107  BY J. WHITE (NOTE) Elevated high sensitivity troponin I (hsTnI) values and significant  changes across serial measurements may suggest ACS but many other  chronic and acute conditions are known to elevate hsTnI results.  Refer to the "Links" section for chest pain algorithms and additional  guidance. Performed at [facility], [address], [place], [place] [address]   Troponin I (High Sensitivity)     Status: Abnormal   Collection Time: 01/22/23  8:45 PM  Result Value Ref Range   Troponin I (High Sensitivity) 1,771 (HH) <18 ng/L    Comment: CRITICAL VALUE NOTED. VALUE IS CONSISTENT WITH PREVIOUSLY REPORTED/CALLED VALUE (NOTE) Elevated high sensitivity troponin I (hsTnI) values and significant  changes across serial measurements may suggest ACS but many other  chronic and acute conditions are known to elevate hsTnI results.  Refer to the "Links" section for chest pain algorithms and additional  guidance. Performed at [facility], [address], [place], [place] [address]   Lipid panel  Status: None   Collection Time: 01/23/23  4:31 AM  Result Value Ref Range   Cholesterol 150 0 - 200 mg/dL   Triglycerides 77 <098 mg/dL   HDL 52 >11 mg/dL   Total CHOL/HDL Ratio 2.9 RATIO   VLDL 15 0 - 40 mg/dL   LDL Cholesterol 83 0 - 99 mg/dL    Comment:        Total Cholesterol/HDL:CHD Risk Coronary Heart Disease Risk Table                     Men   Women  1/2 Average Risk   3.4   3.3  Average Risk       5.0   4.4  2 X Average Risk   9.6   7.1  3 X Average Risk  23.4   11.0        Use the calculated Patient Ratio above and the CHD Risk Table to determine the  patient's CHD Risk.        ATP III CLASSIFICATION (LDL):  <100     mg/dL   Optimal  914-782  mg/dL   Near or Above                    Optimal  130-159  mg/dL   Borderline  956-213  mg/dL   High  >086     mg/dL   Very High Performed at [facility], [address] [place], [place] [address]   Basic metabolic panel     Status: Abnormal   Collection Time: 01/23/23  6:24 AM  Result Value Ref Range   Sodium 131 (L) 135 - 145 mmol/L   Potassium 4.1 3.5 - 5.1 mmol/L   Chloride 97 (L) 98 - 111 mmol/L   CO2 22 22 - 32 mmol/L   Glucose, Bld 117 (H) 70 - 99 mg/dL    Comment: Glucose reference range applies only to samples taken after fasting for at least 8 hours.   BUN 19 8 - 23 mg/dL   Creatinine, Ser 9.62 0.44 - 1.00 mg/dL   Calcium 8.5 (L) 8.9 - 10.3 mg/dL   GFR, Estimated >95 >28 mL/min    Comment: (NOTE) Calculated using the CKD-EPI Creatinine Equation (2021)    Anion gap 12 5 - 15    Comment: Performed at [facility], [address] [place], [place] [address]  CBC     Status: None   Collection Time: 01/23/23  6:24 AM  Result Value Ref Range   WBC 6.2 4.0 - 10.5 K/uL   RBC 4.33 3.87 - 5.11 MIL/uL   Hemoglobin 13.2 12.0 - 15.0 g/dL   HCT 40.1 02.7 - 25.3 %   MCV 91.2 80.0 - 100.0 fL   MCH 30.5 26.0 - 34.0 pg   MCHC 33.4 30.0 - 36.0 g/dL   RDW 66.4 40.3 - 47.4 %   Platelets 231 150 - 400 K/uL   nRBC 0.0 0.0 - 0.2 %    Comment: Performed at [facility], [address], [place], [place] [address]  Heparin level (unfractionated)     Status: Abnormal   Collection Time: 01/23/23  6:24 AM  Result Value Ref Range   Heparin Unfractionated >1.10 (H) 0.30 - 0.70 IU/mL    Comment: (NOTE) The clinical reportable range upper limit is being lowered to >1.10 to align with the FDA approved guidance for the current laboratory assay.  If heparin results  are below expected values, and patient dosage has  been confirmed, suggest follow up testing of antithrombin III  levels. Performed at [facility], [address], [place], [place] [address]   APTT     Status: Abnormal   Collection Time: 01/23/23  6:24 AM  Result Value Ref Range   aPTT 44 (H) 24 - 36 seconds    Comment:        IF BASELINE aPTT IS ELEVATED, SUGGEST PATIENT RISK ASSESSMENT BE USED TO DETERMINE APPROPRIATE ANTICOAGULANT THERAPY. Performed at [facility], [address], [place], [place] [address]     ECG   N/A  Telemetry   Sinus bradycardia - Personally Reviewed  Radiology    DG Chest Port 1 View  Result Date: 01/22/2023 CLINICAL DATA:  77 year old female with chest pain EXAM: PORTABLE CHEST 1 VIEW COMPARISON:  06/02/2022 FINDINGS: Cardiomediastinal silhouette unchanged in size and contour. No evidence of central vascular congestion. No interlobular septal thickening. Low lung volumes. Defibrillator pads on the left chest. Reticular opacities at the left lung base. No pneumothorax or pleural effusion. Coarsened interstitial markings, with no confluent airspace disease. No acute displaced fracture. IMPRESSION: Reticular opacities at the left lung base, potentially atelectasis/scarring versus atypical infection. Defibrillator pads project over the left chest Electronically Signed   By: Gilmer Mor D.O.   On: 01/22/2023 15:31    Cardiac Studies   N/A  Assessment   Principal Problem:   Non-ST elevation (NSTEMI) myocardial infarction [facility]) Active Problems:   Essential hypertension   Sinus bradycardia   Ischemic cardiomyopathy   Plan   Currently chest pain free, but NSTEMI, probably completed last evening - troponin peaked and declined.  Labs ok for cath today - being prehydrated - plan was this afternoon d/t Eliquis yesterday morning, but may go earlier at provider discretion. Will file a safety zone portal as this case should go to peer review. With ongoing chest pain last night and elevated troponins, would have been appropriate in my opinion to go to the  cath lab at that time.  CARDIAC CATHETERIZATION CONSENT  Performing MD:  Dr. Eldridge Dace  Procedure:  Left heart catheterization, possible percutaneous intevention  The procedure with Risks/Benefits/Alternatives and Indications was reviewed with the patient  and her husband.  All questions were answered.    Risks / Complications include, but not limited to: Death, MI, CVA/TIA, VF/VT (with defibrillation), Bradycardia (need for temporary pacer placement), contrast induced nephropathy, bleeding / bruising / hematoma / pseudoaneurysm, vascular or coronary injury (with possible emergent CT or Vascular Surgery), adverse medication reactions, infection.    The patient (and family) voice understanding and agree to proceed.   I have signed the consent form and placed it on the chart for patient signature and RN witness.    Chrystie Nose, MD, [facility], FACP  [place]  [facility]  Medical Director of the Advanced Lipid Disorders &  Cardiovascular Risk Reduction Clinic Diplomate of the American Board of Clinical Lipidology Attending Cardiologist  Direct Dial: [phone]  Fax: [phone]  Website:  [email or web address]  Chrystie Nose 01/23/2023, 8:54 AM     Time Spent Directly with Patient:  I have spent a total of 35 minutes with the patient reviewing hospital notes, telemetry, EKGs, labs and examining the patient as well as establishing an assessment and plan that was discussed personally with the patient.  > 50% of time was spent in direct patient care.  Length of Stay:  LOS: 1 day  Chrystie Nose, MD, [facility], FACP    [facility]  Medical Director of the Advanced Lipid Disorders &  Cardiovascular Risk Reduction Clinic Diplomate of the American Board of Clinical Lipidology Attending Cardiologist  Direct Dial: [phone]  Fax: [phone]  Website:  [email or web address] Nicely Bradlee Bridgers 01/23/2023, 8:50 AM

## 2023-01-23 NOTE — ED Notes (Signed)
Assumed care of patient.

## 2023-01-24 ENCOUNTER — Encounter (HOSPITAL_COMMUNITY): Payer: Self-pay | Admitting: Interventional Cardiology

## 2023-01-24 ENCOUNTER — Other Ambulatory Visit (HOSPITAL_COMMUNITY): Payer: Self-pay

## 2023-01-24 DIAGNOSIS — R55 Syncope and collapse: Secondary | ICD-10-CM | POA: Diagnosis not present

## 2023-01-24 DIAGNOSIS — I493 Ventricular premature depolarization: Secondary | ICD-10-CM

## 2023-01-24 DIAGNOSIS — I214 Non-ST elevation (NSTEMI) myocardial infarction: Secondary | ICD-10-CM | POA: Diagnosis not present

## 2023-01-24 LAB — LIPOPROTEIN A (LPA): Lipoprotein (a): 67.7 nmol/L — ABNORMAL HIGH (ref <75.0)

## 2023-01-24 MED ORDER — SPIRONOLACTONE 12.5 MG HALF TABLET: 12.5000 mg | ORAL_TABLET | Freq: Every day | ORAL | Status: DC

## 2023-01-24 MED ORDER — METOPROLOL SUCCINATE ER 25 MG PO TB24: 12.5000 mg | ORAL_TABLET | Freq: Every day | ORAL | Status: DC

## 2023-01-24 MED ORDER — ASPIRIN 81 MG PO TBEC: 81.0000 mg | DELAYED_RELEASE_TABLET | Freq: Every day | ORAL | 12 refills | Status: DC

## 2023-01-24 MED ORDER — VITAMIN D3 50 MCG (2000 UT) PO CAPS: 2000.0000 [IU] | ORAL_CAPSULE | Freq: Every day | ORAL | Status: AC

## 2023-01-24 MED ORDER — APIXABAN 5 MG PO TABS
5.0000 mg | ORAL_TABLET | Freq: Two times a day (BID) | ORAL | Status: DC
  Administered 2023-01-24: 5 mg via ORAL
  Filled 2023-01-24: qty 1

## 2023-01-24 MED ORDER — METOPROLOL SUCCINATE ER 25 MG PO TB24: 12.5000 mg | ORAL_TABLET | Freq: Every day | ORAL | 4 refills | Status: DC

## 2023-01-24 MED ORDER — SYSTANE 0.4-0.3 % OP SOLN: 1.0000 [drp] | Freq: Three times a day (TID) | OPHTHALMIC | Status: DC

## 2023-01-24 MED ORDER — SPIRONOLACTONE 25 MG PO TABS: 12.5000 mg | ORAL_TABLET | Freq: Every day | ORAL | 6 refills | Status: DC

## 2023-01-24 MED ORDER — METOPROLOL TARTRATE 12.5 MG HALF TABLET
12.5000 mg | ORAL_TABLET | Freq: Two times a day (BID) | ORAL | Status: DC
  Filled 2023-01-24: qty 1

## 2023-01-24 NOTE — Progress Notes (Addendum)
Patient Name: Shannon Cummings Date of Encounter: 01/24/2023 [facility] Cardiologist: Kristeen Miss, MD   Interval Summary  .    Underwent cardiac catheterization yesterday that did not show any signs of culprit lesions.  There is a high suspicion of vasospasm currently.  Patient without any significant complaints of chest pain however did have minor discomfort yesterday.  Feels good today and thinks that she could be discharged.  Vital Signs .    Vitals:   01/23/23 2307 01/24/23 0000 01/24/23 0335 01/24/23 0400  BP: (!) 100/57  109/72   Pulse: (!) 51  (!) 49   Resp: 18  18   Temp: 97.8 F (36.6 C)  97.8 F (36.6 C)   TempSrc: Oral  Oral   SpO2: 94% 96% 97% 97%  Weight:        Intake/Output Summary (Last 24 hours) at 01/24/2023 0801 Last data filed at 01/24/2023 0422 Gross per 24 hour  Intake 548.92 ml  Output 950 ml  Net -401.08 ml      01/23/2023    9:08 AM 12/01/2022    8:56 AM 11/23/2022   10:35 AM  Last 3 Weights  Weight (lbs) 132 lb 4.4 oz 133 lb 133 lb 6 oz  Weight (kg) 60 kg 60.328 kg 60.499 kg      Telemetry/ECG    Sinus bradycardia heart rates 50s- Personally Reviewed  Physical Exam .   GEN: No acute distress.   Neck: No JVD Cardiac: RRR, no murmurs, rubs, or gallops.  Respiratory: Clear to auscultation bilaterally. GI: Soft, nontender, non-distended  MS: No edema  Patient Profile    Shannon Cummings is a 77 y.o. female admitted for evaluation of chest pain.  Initial troponins were negative, then turned positive and peaking up to 1995-1771.  In the emergency room she did have a syncopal episode.  Serial EKGs were taken.  Overall they were unremarkable noting only PVCs and sinus rhythm.  Assessment & Plan .     NSTEMI CAD status post proximal LAD and first OM (stable on cath in 2020) EKG was negative for acute ischemic changes.  Delayed rise in troponins as above.  She was taken to the cardiac Cath Lab with no culprit lesions noted.   She had nonstenotic LAD, first marginal lesion that were previously stented.  Considering this, there is a higher suspicion for vasospasm. Continue aspirin, transitioning heparin to Eliquis today. Will reduce BB dose to 12.5mg  BID. HR 40-50s however has been chronically brady and tolerated well. Also did show indications for PVCs, vasospasm.  Continue to monitor symptoms.  Syncope Carotid bruit Unclear of etiology however given significant pain during episode, may be attributed to a vagal event.  EKG showing normal sinus rhythm with PVCs and a very brief run of 4 beats of NSVT.  Carotid bruits also heard on physical exam, but overall minimal disease noted on dopplers.  Will discuss with MD about possibly discharging on cardiac monitor.  Hyperlipidemia Intolerant to statin and Zetia.  On Praluent PTA.  LP(a) 67.  LDL 83.  Atrial fibrillation Had an ablation in 2022 and has maintained normal sinus rhythm since then.  Hypertension Patient has been also noting decreased blood pressures and has not been taking her home losartan. Continue spiro 25mg , may need to hold as BP has been soft this morning.   Hyperlipidemia Continue Praluent   For questions or updates, please contact [facility] Please consult [email or web address] for contact info under  Signed, Abagail Kitchens, PA-C

## 2023-01-24 NOTE — Progress Notes (Signed)
   01/24/23 1139  TOC Brief Assessment  Insurance and Status Reviewed  Patient has primary care physician Yes  Home environment has been reviewed single family home  Prior level of function: self care  Prior/Current Home Services No current home services  Social Determinants of Health Reivew SDOH reviewed no interventions necessary  Readmission risk has been reviewed Yes  Transition of care needs no transition of care needs at this time

## 2023-01-24 NOTE — Plan of Care (Signed)
Problem: Education: Goal: Understanding of cardiac disease, CV risk reduction, and recovery process will improve 01/24/2023 1153 by Eugene Garnet, RN Outcome: Progressing 01/24/2023 0741 by Eugene Garnet, RN Outcome: Progressing Goal: Individualized Educational Video(s) 01/24/2023 1153 by Eugene Garnet, RN Outcome: Progressing 01/24/2023 0741 by Eugene Garnet, RN Outcome: Progressing   Problem: Activity: Goal: Ability to tolerate increased activity will improve 01/24/2023 1153 by Eugene Garnet, RN Outcome: Progressing 01/24/2023 0741 by Eugene Garnet, RN Outcome: Progressing   Problem: Cardiac: Goal: Ability to achieve and maintain adequate cardiovascular perfusion will improve 01/24/2023 1153 by Eugene Garnet, RN Outcome: Progressing 01/24/2023 0741 by Eugene Garnet, RN Outcome: Progressing   Problem: Health Behavior/Discharge Planning: Goal: Ability to safely manage health-related needs after discharge will improve 01/24/2023 1153 by Eugene Garnet, RN Outcome: Progressing 01/24/2023 0741 by Eugene Garnet, RN Outcome: Progressing   Problem: Education: Goal: Understanding of CV disease, CV risk reduction, and recovery process will improve 01/24/2023 1153 by Eugene Garnet, RN Outcome: Progressing 01/24/2023 0741 by Eugene Garnet, RN Outcome: Progressing Goal: Individualized Educational Video(s) 01/24/2023 1153 by Eugene Garnet, RN Outcome: Progressing 01/24/2023 0741 by Eugene Garnet, RN Outcome: Progressing   Problem: Activity: Goal: Ability to return to baseline activity level will improve 01/24/2023 1153 by Eugene Garnet, RN Outcome: Progressing 01/24/2023 0741 by Eugene Garnet, RN Outcome: Progressing   Problem: Cardiovascular: Goal: Ability to achieve and maintain adequate cardiovascular perfusion will improve 01/24/2023 1153 by Eugene Garnet, RN Outcome: Progressing 01/24/2023 0741 by  Eugene Garnet, RN Outcome: Progressing Goal: Vascular access site(s) Level 0-1 will be maintained 01/24/2023 1153 by Eugene Garnet, RN Outcome: Progressing 01/24/2023 0741 by Eugene Garnet, RN Outcome: Progressing   Problem: Health Behavior/Discharge Planning: Goal: Ability to safely manage health-related needs after discharge will improve 01/24/2023 1153 by Eugene Garnet, RN Outcome: Progressing 01/24/2023 0741 by Eugene Garnet, RN Outcome: Progressing   Problem: Education: Goal: Knowledge of General Education information will improve Description: Including pain rating scale, medication(s)/side effects and non-pharmacologic comfort measures 01/24/2023 1153 by Eugene Garnet, RN Outcome: Progressing 01/24/2023 0741 by Eugene Garnet, RN Outcome: Progressing   Problem: Health Behavior/Discharge Planning: Goal: Ability to manage health-related needs will improve 01/24/2023 1153 by Eugene Garnet, RN Outcome: Progressing 01/24/2023 0741 by Eugene Garnet, RN Outcome: Progressing   Problem: Clinical Measurements: Goal: Ability to maintain clinical measurements within normal limits will improve 01/24/2023 1153 by Eugene Garnet, RN Outcome: Progressing 01/24/2023 0741 by Eugene Garnet, RN Outcome: Progressing Goal: Will remain free from infection 01/24/2023 1153 by Eugene Garnet, RN Outcome: Progressing 01/24/2023 0741 by Eugene Garnet, RN Outcome: Progressing Goal: Diagnostic test results will improve 01/24/2023 1153 by Eugene Garnet, RN Outcome: Progressing 01/24/2023 0741 by Eugene Garnet, RN Outcome: Progressing Goal: Respiratory complications will improve 01/24/2023 1153 by Eugene Garnet, RN Outcome: Progressing 01/24/2023 0741 by Eugene Garnet, RN Outcome: Progressing Goal: Cardiovascular complication will be avoided 01/24/2023 1153 by Eugene Garnet, RN Outcome: Progressing 01/24/2023 0741 by  Eugene Garnet, RN Outcome: Progressing   Problem: Activity: Goal: Risk for activity intolerance will decrease 01/24/2023 1153 by Eugene Garnet, RN Outcome: Progressing 01/24/2023 0741 by Eugene Garnet, RN Outcome: Progressing   Problem: Nutrition: Goal: Adequate nutrition will be maintained 01/24/2023 1153 by Eugene Garnet, RN Outcome: Progressing 01/24/2023 0741 by Eugene Garnet, RN Outcome: Progressing   Problem: Coping: Goal: Level of anxiety will decrease 01/24/2023 1153 by Eugene Garnet, RN Outcome: Progressing 01/24/2023 0741 by Eugene Garnet, RN Outcome: Progressing   Problem: Elimination: Goal: Will not experience complications related to bowel motility 01/24/2023 1153 by  Eugene Garnet, RN Outcome: Progressing 01/24/2023 0741 by Eugene Garnet, RN Outcome: Progressing Goal: Will not experience complications related to urinary retention 01/24/2023 1153 by Eugene Garnet, RN Outcome: Progressing 01/24/2023 0741 by Eugene Garnet, RN Outcome: Progressing   Problem: Pain Managment: Goal: General experience of comfort will improve 01/24/2023 1153 by Eugene Garnet, RN Outcome: Progressing 01/24/2023 0741 by Eugene Garnet, RN Outcome: Progressing   Problem: Safety: Goal: Ability to remain free from injury will improve 01/24/2023 1153 by Eugene Garnet, RN Outcome: Progressing 01/24/2023 0741 by Eugene Garnet, RN Outcome: Progressing   Problem: Skin Integrity: Goal: Risk for impaired skin integrity will decrease 01/24/2023 1153 by Eugene Garnet, RN Outcome: Progressing 01/24/2023 0741 by Eugene Garnet, RN Outcome: Progressing

## 2023-01-24 NOTE — Plan of Care (Signed)

## 2023-01-24 NOTE — TOC Benefit Eligibility Note (Signed)
Patient Product/process development scientist completed.    The patient is insured through [facility]. Patient has Medicare and is not eligible for a copay card, but may be able to apply for patient assistance, if available.    Ran test claim for Eliquis 5 mg and the current 30 day co-pay is $139.63 due to being in Coverage Gap (donut hole).   This test claim was processed through [facility]- copay amounts may vary at other pharmacies due to pharmacy/plan contracts, or as the patient moves through the different stages of their insurance plan.     Roland Earl, CPHT Pharmacy Technician III Certified Patient Advocate [facility] Patient Advocate Team Direct Number: [phone]  Fax: [phone]

## 2023-01-24 NOTE — Discharge Summary (Addendum)
Discharge Summary    Patient ID: Shannon Cummings MRN: [id number]; DOB: 1946/02/11  Admit date: 01/22/2023 Discharge date: 01/24/2023  PCP:  Sheliah Hatch, MD   [facility] Providers Cardiologist:  Kristeen Miss, MD  Electrophysiologist:  Hillis Range, MD (Inactive)  {   Discharge Diagnoses    Principal Problem:   Non-ST elevation (NSTEMI) myocardial infarction [facility]) Active Problems:   Hyperlipidemia   Essential hypertension   Paroxysmal atrial fibrillation (HCC)   CAD (coronary artery disease)   Sinus bradycardia   PVC's (premature ventricular contractions)   Syncope and collapse    Diagnostic Studies/Procedures    Left heart catheterization 01/23/2023 Ost 1st Diag lesion is 70% stenosed.  Similar to prior.   Ost 1st Mrg lesion is 25% stenosed.   RPDA lesion is 50% stenosed.   Dist Cx lesion is 50% stenosed.   Non-stenotic Prox LAD lesion was previously treated.   Non-stenotic 1st Mrg lesion was previously treated.   The left ventricular systolic function is normal.   LV end diastolic pressure is normal.   The left ventricular ejection fraction is 55-65% by visual estimate.   There is no aortic valve stenosis.   In the absence of any other complications or medical issues, we expect the patient to be ready for discharge from a cath perspective.   Recommend to resume Apixaban, at currently prescribed dose and frequency on 01/24/2023.   MINOCA.  No culprit for elevated troponin.  Consider vasospasm.  Medical therapy.  Could restart Eliquis tomorrow.   IV heparin to start this evening.   Echocardiogram 01/23/2023 1. Left ventricular ejection fraction, by estimation, is 60 to 65%. The  left ventricle has normal function. The left ventricle has no regional  wall motion abnormalities. Left ventricular diastolic parameters are  indeterminate.   2. Right ventricular systolic function is mildly reduced. The right  ventricular size is normal. There is normal  pulmonary artery systolic  pressure. The estimated right ventricular systolic pressure is 25.8 mmHg.   3. The mitral valve is normal in structure. Trivial mitral valve  regurgitation. No evidence of mitral stenosis.   4. The aortic valve is normal in structure. Aortic valve regurgitation is  not visualized. No aortic stenosis is present.   5. The inferior vena cava is normal in size with greater than 50%  respiratory variability, suggesting right atrial pressure of 3 mmHg.  _____________   History of Present Illness     Shannon Cummings is a 77 y.o. female with CAD with prior stenting in 2014 and 2020 with stents placed to the proximal LAD and first OM 1, PAF status post ablation in 2022, HLD, HTN, HLD.  She was admitted for NSTEMI.  Patient has history as above.  She had cardiac catheterization in 2020 that noted stable stents.  Also has history of paroxysmal atrial fibrillation status post ablation in 2022 mass maintaining normal sinus rhythm since then.  Patient had been complaining of midsternal chest pain with radiation to her upper back, shortness of breath, nausea.  In the emergency room she became unresponsive on arrival however with palpable pulses.  Patient states that she just felt dizzy and did not have any significant preceding symptoms evidence.  She was started on morphine for pain and nitroglycerin.  Of note she did take 10 mg of metoprolol prior to arrival.  She had admitted to being stressed lately with her husband having recent CVA and CEA.  EKG without any acute ischemic changes.  Did note some PVCs.  Troponins were negative initially however a second set were obtained that showed troponins elevated at almost 2000 and then downtrending to 1700.  Patient was taken for cardiac catheterization. Hospital Course     Consultants:    NSTEMI CAD status post proximal LAD and first OM (stable on cath in 2020) EKG was negative for acute ischemic changes.  Delayed rise in troponins as  above.  She was taken to the cardiac Cath Lab with no culprit lesions noted.  She had nonstenotic LAD, first marginal lesion that were previously stented.  Considering this, there is a higher suspicion for vasospasm. She has not had recurrence of this chest pain since cath.  Continue aspirin Will reduce BB dose to 12.5mg  BID. HR 40-50s however has been chronically brady and tolerated well. Also did show indications for PVCs, vasospasm.  Continue to monitor symptoms. Echocardiogram this admission shows EF 60 to 65% with no regional wall motion abnormalities.  There is mildly reduced RV function.  Syncope Carotid bruit Unclear of etiology however given significant pain during episode, may be attributed to a vagal event.  EKG showing normal sinus rhythm with PVCs and a very brief run of 4 beats of NSVT.  Carotid bruits also heard on physical exam, but overall minimal disease noted on dopplers.  No need for cardiac monitor at this time per MD.   Hyperlipidemia Intolerant to statin and Zetia.  On Praluent PTA.  LP(a) 67.  LDL 83.   Atrial fibrillation Had an ablation in 2022 and has maintained normal sinus rhythm since then.  Continue Eliquis and beta-blocker as above.   Hypertension Patient has been also noting decreased blood pressures and has not been taking her home losartan. We have discontinued her losartan at home.  Also decreasing spironolactone to 12.5 mg for soft blood pressure here.   Hyperlipidemia Continue Praluent  Abnormal TSH TSH 9.9.  Will need to T4/T3 outpatient.  I discussed this with patient and she will follow-up outpatient with her primary care provider.  Right radial cath site free of acute complications.  Follow-up has been made.  Medications will be sent to her home pharmacy.  Patient also has directions to follow-up with primary care provider due to abnormal TSH as above.  She has been seen and evaluated by myself and Dr. Rennis Golden and deemed stable for discharge.  Did  the patient have an acute coronary syndrome (MI, NSTEMI, STEMI, etc) this admission?:  Yes                               AHA/ACC Clinical Performance & Quality Measures: Aspirin prescribed? - Yes ADP Receptor Inhibitor (Plavix/Clopidogrel, Brilinta/Ticagrelor or Effient/Prasugrel) prescribed (includes medically managed patients)? - No - vasospasm Beta Blocker prescribed? - Yes High Intensity Statin (Lipitor 40-80mg  or Crestor 20-40mg ) prescribed? - No - on praluent  EF assessed during THIS hospitalization? - Yes For EF <40%, was ACEI/ARB prescribed? - Not Applicable (EF >/= 40%) For EF <40%, Aldosterone Antagonist (Spironolactone or Eplerenone) prescribed? - Not Applicable (EF >/= 40%) Cardiac Rehab Phase II ordered (including medically managed patients)? - Yes       The patient will be scheduled for a TOC follow up appointment in 14 days.  A message has been sent to the [facility] and Scheduling Pool at the office where the patient should be seen for follow up.  _____________  Discharge Vitals Blood pressure 96/60, pulse Marland Kitchen)  56, temperature 98.3 F (36.8 C), temperature source Oral, resp. rate 17, weight 60 kg, SpO2 96%.  Filed Weights   01/23/23 0908  Weight: 60 kg    Labs & Radiologic Studies    CBC Recent Labs    01/22/23 1246 01/23/23 0624  WBC 9.1 6.2  NEUTROABS 4.0  --   HGB 14.4 13.2  HCT 42.4 39.5  MCV 88.7 91.2  PLT 299 231   Basic Metabolic Panel Recent Labs    16/10/96 1925 01/23/23 0624  NA 132* 131*  K 3.7 4.1  CL 98 97*  CO2 21* 22  GLUCOSE 120* 117*  BUN 17 19  CREATININE 0.87 0.89  CALCIUM 8.9 8.5*  MG 1.8  --    Liver Function Tests Recent Labs    01/22/23 1925  AST 41  ALT 25  ALKPHOS 57  BILITOT 1.2  PROT 7.1  ALBUMIN 4.0   No results for input(s): "LIPASE", "AMYLASE" in the last 72 hours. High Sensitivity Troponin:   Recent Labs  Lab 01/22/23 1246 01/22/23 1323 01/22/23 1925 01/22/23 2045  TROPONINIHS 6 22* 1,995* 1,771*     BNP Invalid input(s): "POCBNP" D-Dimer No results for input(s): "DDIMER" in the last 72 hours. Hemoglobin A1C Recent Labs    01/22/23 1925  HGBA1C 5.6   Fasting Lipid Panel Recent Labs    01/23/23 0431  CHOL 150  HDL 52  LDLCALC 83  TRIG 77  CHOLHDL 2.9   Thyroid Function Tests Recent Labs    01/22/23 1925  TSH 9.927*   _____________  CARDIAC CATHETERIZATION  Result Date: 01/23/2023   Ost 1st Diag lesion is 70% stenosed.  Similar to prior.   Ost 1st Mrg lesion is 25% stenosed.   RPDA lesion is 50% stenosed.   Dist Cx lesion is 50% stenosed.   Non-stenotic Prox LAD lesion was previously treated.   Non-stenotic 1st Mrg lesion was previously treated.   The left ventricular systolic function is normal.   LV end diastolic pressure is normal.   The left ventricular ejection fraction is 55-65% by visual estimate.   There is no aortic valve stenosis.   In the absence of any other complications or medical issues, we expect the patient to be ready for discharge from a cath perspective.   Recommend to resume Apixaban, at currently prescribed dose and frequency on 01/24/2023. MINOCA.  No culprit for elevated troponin.  Consider vasospasm.  Medical therapy.  Could restart Eliquis tomorrow.  IV heparin to start this evening.   ECHOCARDIOGRAM COMPLETE  Result Date: 01/23/2023    ECHOCARDIOGRAM REPORT   Patient Name:   Shannon Cummings Date of Exam: 01/23/2023 Medical Rec #:  [id number]         Height:       61.0 in Accession #:    [id number]        Weight:       132.3 lb Date of Birth:  01-05-1946         BSA:          1.584 m Patient Age:    77 years          BP:           126/78 mmHg Patient Gender: F                 HR:           49 bpm. Exam Location:  Inpatient Procedure: 2D Echo, Cardiac Doppler, Color Doppler and Intracardiac  Opacification Agent Indications:    Chest Pain  History:        Patient has prior history of Echocardiogram examinations, most                 recent  02/21/2021. CAD and Previous Myocardial Infarction; Risk                 Factors:Hypertension and Dyslipidemia.  Sonographer:    Meagan Baucom RDCS, FE, PE Referring Phys: [id number] LAURA R INGOLD IMPRESSIONS  1. Left ventricular ejection fraction, by estimation, is 60 to 65%. The left ventricle has normal function. The left ventricle has no regional wall motion abnormalities. Left ventricular diastolic parameters are indeterminate.  2. Right ventricular systolic function is mildly reduced. The right ventricular size is normal. There is normal pulmonary artery systolic pressure. The estimated right ventricular systolic pressure is 25.8 mmHg.  3. The mitral valve is normal in structure. Trivial mitral valve regurgitation. No evidence of mitral stenosis.  4. The aortic valve is normal in structure. Aortic valve regurgitation is not visualized. No aortic stenosis is present.  5. The inferior vena cava is normal in size with greater than 50% respiratory variability, suggesting right atrial pressure of 3 mmHg. FINDINGS  Left Ventricle: Left ventricular ejection fraction, by estimation, is 60 to 65%. The left ventricle has normal function. The left ventricle has no regional wall motion abnormalities. The left ventricular internal cavity size was normal in size. There is  no left ventricular hypertrophy. Left ventricular diastolic parameters are indeterminate. Normal left ventricular filling pressure. Right Ventricle: The right ventricular size is normal. No increase in right ventricular wall thickness. Right ventricular systolic function is mildly reduced. There is normal pulmonary artery systolic pressure. The tricuspid regurgitant velocity is 2.39 m/s, and with an assumed right atrial pressure of 3 mmHg, the estimated right ventricular systolic pressure is 25.8 mmHg. Left Atrium: Left atrial size was normal in size. Right Atrium: Right atrial size was normal in size. Pericardium: There is no evidence of pericardial effusion.  Mitral Valve: The mitral valve is normal in structure. Trivial mitral valve regurgitation. No evidence of mitral valve stenosis. Tricuspid Valve: The tricuspid valve is normal in structure. Tricuspid valve regurgitation is trivial. No evidence of tricuspid stenosis. Aortic Valve: The aortic valve is normal in structure. Aortic valve regurgitation is not visualized. No aortic stenosis is present. Aortic valve mean gradient measures 1.0 mmHg. Aortic valve peak gradient measures 3.3 mmHg. Aortic valve area, by VTI measures 2.77 cm. Pulmonic Valve: The pulmonic valve was normal in structure. Pulmonic valve regurgitation is trivial. No evidence of pulmonic stenosis. Aorta: The aortic root is normal in size and structure. Venous: The inferior vena cava is normal in size with greater than 50% respiratory variability, suggesting right atrial pressure of 3 mmHg. IAS/Shunts: No atrial level shunt detected by color flow Doppler.  LEFT VENTRICLE PLAX 2D LVIDd:         4.50 cm     Diastology LVIDs:         3.90 cm     LV e' medial:    4.82 cm/s LV PW:         0.90 cm     LV E/e' medial:  12.9 LV IVS:        0.90 cm     LV e' lateral:   5.75 cm/s LVOT diam:     2.00 cm     LV E/e' lateral: 10.8 LV SV:  51 LV SV Index:   32 LVOT Area:     3.14 cm  LV Volumes (MOD) LV vol d, MOD A4C: 68.9 ml LV vol s, MOD A4C: 28.7 ml LV SV MOD A4C:     68.9 ml RIGHT VENTRICLE RV S prime:     9.17 cm/s TAPSE (M-mode): 1.6 cm LEFT ATRIUM             Index        RIGHT ATRIUM          Index LA diam:        4.00 cm 2.52 cm/m   RA Area:     9.64 cm LA Vol (A2C):   32.2 ml 20.32 ml/m  RA Volume:   15.40 ml 9.72 ml/m LA Vol (A4C):   30.8 ml 19.44 ml/m LA Biplane Vol: 32.1 ml 20.26 ml/m  AORTIC VALVE AV Area (Vmax):    2.39 cm AV Area (Vmean):   2.60 cm AV Area (VTI):     2.77 cm AV Vmax:           91.10 cm/s AV Vmean:          56.000 cm/s AV VTI:            0.185 m AV Peak Grad:      3.3 mmHg AV Mean Grad:      1.0 mmHg LVOT Vmax:          69.20 cm/s LVOT Vmean:        46.400 cm/s LVOT VTI:          0.163 m LVOT/AV VTI ratio: 0.88  AORTA Ao Root diam: 3.30 cm Ao Asc diam:  3.30 cm MITRAL VALVE               TRICUSPID VALVE MV Area (PHT): 1.64 cm    TR Peak grad:   22.8 mmHg MV Decel Time: 463 msec    TR Vmax:        239.00 cm/s MV E velocity: 62.10 cm/s MV A velocity: 54.40 cm/s  SHUNTS MV E/A ratio:  1.14        Systemic VTI:  0.16 m                            Systemic Diam: 2.00 cm Armanda Magic MD Electronically signed by Armanda Magic MD Signature Date/Time: 01/23/2023/12:01:14 PM    Final    VAS US CAROTID  Result Date: 01/23/2023 Carotid Arterial Duplex Study Patient Name:  Shannon Cummings  Date of Exam:   01/23/2023 Medical Rec #: [id number]          Accession #:    [id number] Date of Birth: 1945/07/13          Patient Gender: F Patient Age:   23 years Exam Location:  [facility] Procedure:      VAS US CAROTID Referring Phys: Nada Boozer --------------------------------------------------------------------------------  Indications:       Bilateral bruits. Risk Factors:      Hypertension, hyperlipidemia, no history of smoking, prior                    MI, coronary artery disease. Other Factors:     Afib. Comparison Study:  No previous exams Performing Technologist: Jody Hill RVT, RDMS  Examination Guidelines: A complete evaluation includes B-mode imaging, spectral Doppler, color Doppler, and power Doppler as needed of all accessible portions of each vessel.  Bilateral testing is considered an integral part of a complete examination. Limited examinations for reoccurring indications may be performed as noted.  Right Carotid Findings: +----------+--------+--------+--------+------------------+------------------+           PSV cm/sEDV cm/sStenosisPlaque DescriptionComments           +----------+--------+--------+--------+------------------+------------------+ CCA Prox  61      16                                                    +----------+--------+--------+--------+------------------+------------------+ CCA Distal47      17                                intimal thickening +----------+--------+--------+--------+------------------+------------------+ ICA Prox  46      19                                                   +----------+--------+--------+--------+------------------+------------------+ ICA Distal41      17                                                   +----------+--------+--------+--------+------------------+------------------+ ECA       50      12                                                   +----------+--------+--------+--------+------------------+------------------+ +----------+--------+-------+----------------+-------------------+           PSV cm/sEDV cmsDescribe        Arm Pressure (mmHG) +----------+--------+-------+----------------+-------------------+ [id number]             Multiphasic, WNL                    +----------+--------+-------+----------------+-------------------+ +---------+--------+--+--------+-+---------+ VertebralPSV cm/s28EDV cm/s8Antegrade +---------+--------+--+--------+-+---------+  Left Carotid Findings: +----------+--------+--------+--------+------------------+------------------+           PSV cm/sEDV cm/sStenosisPlaque DescriptionComments           +----------+--------+--------+--------+------------------+------------------+ CCA Prox  67      19                                                   +----------+--------+--------+--------+------------------+------------------+ CCA Distal57      20                                intimal thickening +----------+--------+--------+--------+------------------+------------------+ ICA Prox  54      20                                                   +----------+--------+--------+--------+------------------+------------------+ ICA  Distal49      15                                                    +----------+--------+--------+--------+------------------+------------------+ ECA       44      8                                                    +----------+--------+--------+--------+------------------+------------------+ +----------+--------+--------+----------------+-------------------+           PSV cm/sEDV cm/sDescribe        Arm Pressure (mmHG) +----------+--------+--------+----------------+-------------------+ [id number]              Multiphasic, WNL                    +----------+--------+--------+----------------+-------------------+ +---------+--------+--+--------+--+---------+ VertebralPSV cm/s34EDV cm/s11Antegrade +---------+--------+--+--------+--+---------+   Summary: Right Carotid: The extracranial vessels were near-normal with only minimal wall                thickening or plaque. Left Carotid: The extracranial vessels were near-normal with only minimal wall               thickening or plaque. Vertebrals:  Bilateral vertebral arteries demonstrate antegrade flow. Subclavians: Normal flow hemodynamics were seen in bilateral subclavian              arteries. *See table(s) above for measurements and observations.  Electronically signed by Waverly Ferrari MD on 01/23/2023 at 11:00:19 AM.    Final    DG Chest Port 1 View  Result Date: 01/22/2023 CLINICAL DATA:  77 year old female with chest pain EXAM: PORTABLE CHEST 1 VIEW COMPARISON:  06/02/2022 FINDINGS: Cardiomediastinal silhouette unchanged in size and contour. No evidence of central vascular congestion. No interlobular septal thickening. Low lung volumes. Defibrillator pads on the left chest. Reticular opacities at the left lung base. No pneumothorax or pleural effusion. Coarsened interstitial markings, with no confluent airspace disease. No acute displaced fracture. IMPRESSION: Reticular opacities at the left lung base, potentially atelectasis/scarring versus atypical infection.  Defibrillator pads project over the left chest Electronically Signed   By: Gilmer Mor D.O.   On: 01/22/2023 15:31   Disposition   Pt is being discharged home today in good condition.  Follow-up Plans & Appointments     Follow-up Information     Sharlene Dory, PA-C Follow up.   Specialty: Cardiology Why: Wednesday Feb 07, 2023 Appt at 8:25 AM (25 min) Contact information: [address] [place] [address] [phone]                Discharge Instructions     Amb Referral to Cardiac Rehabilitation   Complete by: As directed    Diagnosis: NSTEMI   After initial evaluation and assessments completed: Virtual Based Care may be provided alone or in conjunction with Phase 2 Cardiac Rehab based on patient barriers.: Yes   Intensive Cardiac Rehabilitation (ICR) [facility] location only OR Traditional Cardiac Rehabilitation (TCR) *If criteria for ICR are not met will enroll in TCR [facility] only): Yes   Diet - low sodium heart healthy   Complete by: As directed    Discharge instructions   Complete by: As directed  Radial Site Care Refer to this sheet in the next few weeks. These instructions provide you with information on caring for yourself after your procedure. Your caregiver may also give you more specific instructions. Your treatment has been planned according to current medical practices, but problems sometimes occur. Call your caregiver if you have any problems or questions after your procedure. HOME CARE INSTRUCTIONS You may shower the day after the procedure. Remove the bandage (dressing) and gently wash the site with plain soap and water. Gently pat the site dry.  Do not apply powder or lotion to the site.  Do not submerge the affected site in water for 3 to 5 days.  Inspect the site at least twice daily.  Do not flex or bend the affected arm for 24 hours.  No lifting over 5 pounds (2.3 kg) for 5 days after your procedure.  Do not drive home if you are discharged  the same day of the procedure. Have someone else drive you.  You may drive 24 hours after the procedure unless otherwise instructed by your caregiver.  What to expect: Any bruising will usually fade within 1 to 2 weeks.  Blood that collects in the tissue (hematoma) may be painful to the touch. It should usually decrease in size and tenderness within 1 to 2 weeks.  SEEK IMMEDIATE MEDICAL CARE IF: You have unusual pain at the radial site.  You have redness, warmth, swelling, or pain at the radial site.  You have drainage (other than a small amount of blood on the dressing).  You have chills.  You have a fever or persistent symptoms for more than 72 hours.  You have a fever and your symptoms suddenly get worse.  Your arm becomes pale, cool, tingly, or numb.  You have heavy bleeding from the site. Hold pressure on the site.    You did have an abnormal thyroid function test that needs follow-up with your primary care provider.  There is no acute concerns but needs to be addressed at upcoming appointment.   Increase activity slowly   Complete by: As directed         Discharge Medications   Allergies as of 01/24/2023       Reactions   Effexor [venlafaxine] Other (See Comments)   Sedation    Flomax [tamsulosin] Other (See Comments)   Hypotension Near syncope Weakness Vertigo   Macrobid [nitrofurantoin] Diarrhea, Nausea Only, Other (See Comments)   Severe abdominal pain   Other Anxiety, Other (See Comments)   Intolerance to strong pain medications=  Make her feel anxious and feel like coming out of her skin   B-12 Compliance Injection [cyanocobalamin] Other (See Comments)   Headaches    Imdur [isosorbide Nitrate] Other (See Comments)   Headaches    Statins Other (See Comments)   Restless legs Bad feeling  Symptoms occurred while taking Crestor 5mg  (once weekly), Lipitor 10mg  (twice weekly) and Simvastatin  (daily).   Zetia [ezetimibe] Other (See Comments)   Myalgias         Medication List     STOP taking these medications    losartan 25 MG tablet Commonly known as: COZAAR   propranolol 10 MG tablet Commonly known as: INDERAL       TAKE these medications    acetaminophen 500 MG tablet Commonly known as: TYLENOL Take 500 mg by mouth daily as needed for moderate pain, fever or headache.   apixaban 5 MG Tabs tablet Commonly known as: Eliquis TAKE 1  TABLET(5 MG) BY MOUTH TWICE DAILY   aspirin EC 81 MG tablet Take 1 tablet (81 mg total) by mouth daily. Swallow whole. Start taking on: January 25, 2023   doxycycline 50 MG capsule Commonly known as: MONODOX Take 50 mg by mouth every Monday, Wednesday, and Friday.   fluticasone 50 MCG/ACT nasal spray Commonly known as: FLONASE Place 2 sprays into both nostrils daily as needed for allergies or rhinitis.   metoprolol succinate 25 MG 24 hr tablet Commonly known as: TOPROL-XL Take 0.5 tablets (12.5 mg total) by mouth at bedtime.   Multivitamin Women 50+ Tabs Take 1 tablet by mouth daily.   Nexletol 180 MG Tabs Generic drug: Bempedoic Acid TAKE 1 TABLET BY MOUTH DAILY   nitroGLYCERIN 0.4 MG SL tablet Commonly known as: NITROSTAT Place 1 tablet (0.4 mg total) under the tongue every 5 (five) minutes as needed for chest pain.   potassium chloride 10 MEQ tablet Commonly known as: KLOR-CON TAKE 1 TABLET(10 MEQ) BY MOUTH DAILY   Praluent 75 MG/ML Soaj Generic drug: Alirocumab Inject 75 mg into the skin every 14 (fourteen) days.   spironolactone 25 MG tablet Commonly known as: ALDACTONE Take 0.5 tablets (12.5 mg total) by mouth daily. Start taking on: January 25, 2023 What changed:  how much to take when to take this   Systane 0.4-0.3 % Soln Generic drug: Polyethyl Glycol-Propyl Glycol Place 1 drop into both eyes 3 (three) times daily.   Systane Complete 0.6 % Soln Generic drug: Propylene Glycol Place 1 drop into both eyes 3 (three) times daily.   VITAMIN D-3 PO Take 1 capsule by  mouth daily.   Vitamin D3 50 MCG (2000 UT) capsule Take 1 capsule (2,000 Units total) by mouth daily.           Outstanding Labs/Studies   Duration of Discharge Encounter   Greater than 30 minutes including physician time.  Signed, Abagail Kitchens, PA-C 01/24/2023, 11:25 AM

## 2023-01-24 NOTE — Progress Notes (Signed)
CARDIAC REHAB PHASE I   Pt ambulating independently in room, tolerating with no CP, SOB or dizziness. Post MI education including site care, restrictions, risk factors, MI booklet, exercise guidelines, NTG use, heart healthy diet and CRP2 reviewed. All questions and concerns addressed. Will refer to [facility] for CRP2. Plan for possible discharge later today.   [id number]  Woodroe Chen, RN BSN 01/24/2023 9:28 AM

## 2023-01-25 ENCOUNTER — Telehealth: Payer: Self-pay

## 2023-01-25 NOTE — Transitions of Care (Post Inpatient/ED Visit) (Signed)
01/25/2023  Name: Shannon Cummings MRN: [id number] DOB: 1945-10-16  Today's TOC FU Call Status: Today's TOC FU Call Status:: Successful TOC FU Call Completed TOC FU Call Complete Date: 01/25/23  Transition Care Management Follow-up Telephone Call Date of Discharge: 01/24/23 Discharge Facility: [facility]) Type of Discharge: Inpatient Admission Primary Inpatient Discharge Diagnosis:: "atypical CP" How have you been since you were released from the hospital?: Same (pt states "still having some dizziness like in the hospital'-BP 131/84, HR-58(pt reports normal for her)-she has not taken meds for today, she just ate first meal for today and is lying down. Cath site looks good-no s/s infection-she showered today) Any questions or concerns?: No  Items Reviewed: Did you receive and understand the discharge instructions provided?: Yes Medications obtained,verified, and reconciled?: Yes (Medications Reviewed) Any new allergies since your discharge?: No Dietary orders reviewed?: Yes Type of Diet Ordered:: low salt/heart healthy Do you have support at home?: Yes People in Home: spouse Name of Support/Comfort Primary Source: Charlie  Medications Reviewed Today: Medications Reviewed Today     Reviewed by Charlyn Minerva, RN (Registered Nurse) on 01/25/23 at 1243  Med List Status: <None>   Medication Order Taking? Sig Documenting Provider Last Dose Status Informant  acetaminophen (TYLENOL) 500 MG tablet [id number] Yes Take 500 mg by mouth daily as needed for moderate pain, fever or headache. [provider] Taking Active Self  Alirocumab (PRALUENT) 75 MG/ML SOAJ [id number] Yes Inject 75 mg into the skin every 14 (fourteen) days. Nahser, Deloris Ping, MD Taking Active Self, Pharmacy Records  apixaban [facility]) 5 MG TABS tablet [id number] Yes TAKE 1 TABLET(5 MG) BY MOUTH TWICE DAILY Nahser, Deloris Ping, MD Taking Active Self, Pharmacy Records  aspirin EC 81 MG tablet [id number] Yes  Take 1 tablet (81 mg total) by mouth daily. Swallow whole. Abagail Kitchens, PA-C Taking Active   Bempedoic Acid (NEXLETOL) 180 MG TABS [id number] Yes TAKE 1 TABLET BY MOUTH DAILY Nahser, Deloris Ping, MD Taking Active Self, Pharmacy Records  Cholecalciferol (VITAMIN D-3 PO) [id number] Yes Take 1 capsule by mouth daily. [provider] Taking Active Self  Cholecalciferol (VITAMIN D3) 50 MCG (2000 UT) capsule [id number] Yes Take 1 capsule (2,000 Units total) by mouth daily. Abagail Kitchens, PA-C Taking Active   doxycycline (MONODOX) 50 MG capsule [id number] Yes Take 50 mg by mouth every Monday, Wednesday, and Friday. [provider] Taking Active Self, Pharmacy Records           Med Note (COFFELL, Foye Spurling Jan 22, 2023  6:29 PM) Per pt, takes continuously for rosacea  fluticasone (FLONASE) 50 MCG/ACT nasal spray [id number] Yes Place 2 sprays into both nostrils daily as needed for allergies or rhinitis. Alveria Apley, NP Taking Active Self, Pharmacy Records  metoprolol succinate (TOPROL-XL) 25 MG 24 hr tablet [id number] Yes Take 0.5 tablets (12.5 mg total) by mouth at bedtime. Abagail Kitchens, PA-C Taking Active   Multiple Vitamins-Minerals (MULTIVITAMIN WOMEN 50+) TABS [id number] Yes Take 1 tablet by mouth daily. [provider] Taking Active Self  nitroGLYCERIN (NITROSTAT) 0.4 MG SL tablet [id number] Yes Place 1 tablet (0.4 mg total) under the tongue every 5 (five) minutes as needed for chest pain. Nahser, Deloris Ping, MD Taking Active Self, Pharmacy Records  Polyethyl Glycol-Propyl Glycol (SYSTANE) 0.4-0.3 % SOLN [id number] Yes Place 1 drop into both eyes 3 (three) times daily. Abagail Kitchens, PA-C Taking Active   potassium chloride (KLOR-CON) 10 MEQ tablet [id number] Yes TAKE  1 TABLET(10 MEQ) BY MOUTH DAILY Nahser, Deloris Ping, MD Taking Active Self, Pharmacy Records  Propylene Glycol (SYSTANE COMPLETE) 0.6 % SOLN [id number] Yes Place 1 drop into both eyes 3 (three) times daily.  [provider] Taking Active Self, Pharmacy Records  spironolactone (ALDACTONE) 25 MG tablet [id number] Yes Take 0.5 tablets (12.5 mg total) by mouth daily. Abagail Kitchens, PA-C Taking Active             Home Care and Equipment/Supplies: Were Home Health Services Ordered?: NA Any new equipment or medical supplies ordered?: NA  Functional Questionnaire: Do you need assistance with bathing/showering or dressing?: No Do you need assistance with meal preparation?: No Do you need assistance with eating?: No Do you have difficulty maintaining continence: No Do you need assistance with getting out of bed/getting out of a chair/moving?: No Do you have difficulty managing or taking your medications?: No  Follow up appointments reviewed: PCP Follow-up appointment confirmed?: No (offered to asssit pt wth making PCP appt-Pt declined) MD Provider Line Number:[phone] Given: No Specialist Hospital Follow-up appointment confirmed?: Yes Date of Specialist follow-up appointment?: 02/07/23 Follow-Up Specialty Provider:: Colin Broach Do you need transportation to your follow-up appointment?: No (pt confirms spouse will take her to appts) Do you understand care options if your condition(s) worsen?: Yes-patient verbalized understanding  SDOH Interventions Today    Flowsheet Row Most Recent Value  SDOH Interventions   Food Insecurity Interventions Intervention Not Indicated  Transportation Interventions Intervention Not Indicated      TOC Interventions Today    Flowsheet Row Most Recent Value  TOC Interventions   TOC Interventions Discussed/Reviewed TOC Interventions Discussed  [pt aware to seek medical care/attention if sxs do not improve and/or worsen]      Interventions Today    Flowsheet Row Most Recent Value  General Interventions   General Interventions Discussed/Reviewed General Interventions Discussed, Doctor Visits  Doctor Visits Discussed/Reviewed Doctor Visits  Discussed, PCP, Specialist  PCP/Specialist Visits Compliance with follow-up visit  Education Interventions   Education Provided Provided Education  Provided Verbal Education On Nutrition, When to see the doctor, Other  [sx mgmt]  Nutrition Interventions   Nutrition Discussed/Reviewed Nutrition Discussed  Pharmacy Interventions   Pharmacy Dicussed/Reviewed Pharmacy Topics Discussed, Medications and their functions  Safety Interventions   Safety Discussed/Reviewed Safety Discussed, Home Safety, Fall Risk       Alessandra Grout [facility] Care Management Care Management Community Coordinator Direct Phone: [phone] Toll Free: [phone] Fax: [phone]

## 2023-01-30 ENCOUNTER — Telehealth (HOSPITAL_COMMUNITY): Payer: Self-pay

## 2023-01-30 NOTE — Telephone Encounter (Signed)
Pt insurance is active and benefits verified through Medicare A/B. Co-pay $0.00, DED $240.00/$240.00 met, out of pocket $0.00/$0.00 met, co-insurance 20%. No pre-authorization required. Passport, 01/30/23 @ 3:08PM, REF#[id number]   How many CR sessions are covered? (36 visits for TCR, 72 visits for ICR)72 Is this a lifetime maximum or an annual maximum? [employer] Has the member used any of these services to date? 41 Is there a time limit (weeks/months) on start of program and/or program completion? No   2ndary insurance is active and benefits verified through [place]. Co-pay $0.00, DED $0.00/$0.00 met, out of pocket $0.00/$0.00 met, co-insurance 0%. No pre-authorization required. Passport, 01/30/23 @ 3:13PM, REF#[id number]      Will contact patient to see if she is interested in the Cardiac Rehab Program. If interested, patient will need to complete follow up appt. Once completed, patient will be contacted for scheduling upon review by the RN Navigator.

## 2023-01-30 NOTE — Telephone Encounter (Signed)
Called patient to see if she is interested in the Cardiac Rehab Program. Patient expressed interest. Explained scheduling process and went over insurance, patient verbalized understanding. Will contact patient for scheduling once f/u has been completed. 

## 2023-01-31 NOTE — Progress Notes (Unsigned)
Cardiology Office Note   Date:  02/01/2023   ID:  [name], [name] [birth year], MRN [id number]  PCP:  Sheliah Hatch, MD  Cardiologist:   Kristeen Miss, MD   Chief Complaint  Patient presents with   Coronary Artery Disease   Congestive Heart Failure        Atrial Fibrillation        1. Coronary artery disease-status post stenting in the past.  She status post stenting of her proximal LAD  08/14/2012 2. Intermittent atrial fibrillation 3.  hyperlipidemia-she has been generally intolerant to most statins   Pt is doing well.  No cardiac complaints.     She complains of some tingling and numbness associated with the Crestor.  She has discontinued the Crestor because of that reason.  She has had reactions to most statins that we have tried.  October 08-2011  She has done well.  She has not had any angina or eposides of atrial fib.  She has retired and is traveling quite a bit.  Her husband has also retired.  She has not had any problems.  October 01, 2012:  She is having some bruising ( due to [place]).  She is enjoying the cardiac rehab.  She has noticed some low BP readings at the end of cardiac rehab.  She does not feel bad.  These low readings are typically asymptomatic.    No angina.  No arrhythmias.   August 20, 2013:  Shannon Cummings is doing ok.  She was admitted to the hospital with some CP recently.  Rule out for MI.    She has been doing Ok.  Has gained a bit of weight.   She is not able to exercise because  Of left ankle problems.    August 19, 2014:   Shannon Cummings is a 77 y.o. female who presents for follow up of her atrial fib. Has gained some weight.  She is in the study for lipid management Has had leg cramps since last fall. Off and on  Has had more paroxysmal atrial fib.  Lasts about a minute. Seem to be occuring more frequently.   Jan. 18, 2017:  Shannon Cummings presents for follow up of her CAD and atrial fib.  Doing well.   Has visited [place]  recently .   No CP or dyspnea.   Tolerates the Rythmol.  Talked about her daughter who lives in [place].  Is not having any significant atrial fib issues.   Has been on lipid lowering study drug at [facility]  ( injectable cholesterol medication )   Jan.   25, 2018:  Doing well.   Feeling well Has had a stomach bug last week and now has a URI  - not the flu.   Has maintained NSR .  Is having hair loss.  Her research shows that amlodipine and atenolol can cause this   Aug. 23, 2018:  Shannon Cummings is seen today for eval of some elevated BP Has been having pain in her back, tightness in her chest  Went to ER.  Has had elevated BP for a week,   Went back to ER  Doubled her metoprolol XL .  Saw Tereso Newcomer on Aug. 6. 2018 Was given Imdur - developed a headache that lasted 3 days  Lexiscan myoview was low risk.   Her back pain / chest tightness has resolved.   May 08, 2017: Shannon Cummings seen today for follow-up of her hypertension, coronary artery disease, paroxysmal atrial  fibrillation. Is feeling well  Is taking the Toprol XL just once a day - not BID as listed in MAR .   August 15, 2017   Shannon Cummings is doing well BP is much better.  Is exercising and watching her diet.   Is on a study drug for cholesterol   June 18, 2018: He was admitted to the hospital recently with a non-ST segment elevation myocardial infarction. The previously placed LAD stent was widely patent.  She had a tight stenosis in her first obtuse marginal artery that was stented. Function is mildly reduced with an EF of 45 to 50%.  She returned to the ER the day after she went home with an episode of rapid AF and CP .    Troponin was  0.18  Her rhythmol has been stopped . Saw [name] and was started on Amiodarone . The plan is for short term Amio and then consider AF ablation . They also discussed Tikosyn .  She tolerates her AF poorly.  We discussed taking short acting beta blockers if she has AF  with CP  Had been walking ,   Has been weak since the MI.    Feb. 26, 2020  [name] is seen for follow up of her CAD and PAF Has had some shortness of breath . BP is a bit elevated today  amio has been reduced to 200 mg a day  Has an appt with Dr. Johney Frame on March 23 to discuss ablation  Is in a lipid study   Nov. 22, 2021: Shannon Cummings is seen for follow up of her CAD, PAF . She had a NSTEMI in Dec.   Propafaone was stopped after that hospitalization.  Was started on Amiodarone  Doing well from a cardiac standpoint.  Is on lots abx.  Has not been exercising .    Will check lipids, liver enz, bmp today  Is being treated for C.diff colitis   Jan. 9, 2023 Shannon Cummings is seen for follow up of her CAD, PAF She had previously been on profafanone until a NSTEMI several years ago.   Was started on amio.   We referred her for AF ablation given the side effects of amio.   Was seen by [facility]red.  She has had an atrial fib ablation since I last saw her. Continues with Eliquis.   No cp, Not as much exercise as she should be getting  Has lost some weight  - wt is 133 lbs    Feb. 28, 2024 Shannon Cummings is seen for follow up of her CAD, PAF.  S/p afib ablation She was seen in the hospital on June 02, 2022 with episodes of chest pain.  Felt like she was going to pass out .  She had taken 2 nitroglycerin without any relief. Troponins x 2 were normal (3, 4).  No further episodes of CP .   She is going back to [place] this spring .  Going to [place]  She has been on [employer] and bempedoic acid.  Despite our best efforts her LDL is still 96.  She started about 200.   Feb 01, 2023  Shannon Cummings is seen for follow up of her CAD, PAF, afib ablation Her husband had a stroke at the end of July, Had carotid surgery   She had angina , was admitted to [facility]  Had a very tough time while in the ER ,  Is very dissatisfied with her care  - trouble with her nurse call button.  She called the operator from her ER   room Called a total of 5 times to the operator   Troponins were 1995, 1771  Cath showed her CAD as stable.  Troponin elevation was thought to be due to spasm  Mild anterolateral hypokinesis  Echo shows normal LV EF  60-65%  RV function is mildly reduced  Has not taken her Losartan in weeks .  Has been DC'd.  Was started on metoprolol following hospitalization   No further episodes of CP  Is having orthostatic hypotension     Past Medical History:  Diagnosis Date   Arthritis    CAD (coronary artery disease)    a. prior stenting history. b. NSTEMI s/p DES to prox LAD with EF 45% by cath 08/14/12. c. Mild trop elevation shortly after cath ?mild plaque embolization - patent stent on relook. // d. Myoview 8/18: EF 60, normal perfusion; Low Risk. e. NSTEMI 06/2018- patent prior LAD stent, 80% OM1 s/p DES, normal LVEDP, EF 45-50%, moderate residual disease treated medically.   Fatty infiltration of liver    GERD (gastroesophageal reflux disease)    Hyperlipidemia    Hypertension    Hypokalemia    Hypotension    Myocardial infarction (HCC) 2014   OSA on CPAP    Osteoporosis    PAF (paroxysmal atrial fibrillation) (HCC)    On rythmol previously   Pulmonary nodule    a. 8mm subpleural nodular density CT 08/2012, instructed to f/u pulm MD.   Sinus bradycardia    Tubular adenoma of colon 2007    Past Surgical History:  Procedure Laterality Date   ATRIAL FIBRILLATION ABLATION N/A 07/27/2020   Procedure: ATRIAL FIBRILLATION ABLATION;  Surgeon: Hillis Range, MD;  Location: [facility] INVASIVE CV LAB;  Service: Cardiovascular;  Laterality: N/A;   COLONOSCOPY  03/07/2016   COLONOSCOPY WITH PROPOFOL  07/04/2021   CORONARY ANGIOPLASTY WITH STENT PLACEMENT  08/14/2012   LAD  Dr Tonny Bollman   CORONARY STENT INTERVENTION N/A 06/14/2018   Procedure: CORONARY STENT INTERVENTION;  Surgeon: Corky Crafts, MD;  Location: [facility] INVASIVE CV LAB;  Service: Cardiovascular;  Laterality: N/A;    CORONARY STENT PLACEMENT  2000   hair restoration  02/2017   LEFT HEART CATH AND CORONARY ANGIOGRAPHY N/A 06/14/2018   Procedure: LEFT HEART CATH AND CORONARY ANGIOGRAPHY;  Surgeon: Corky Crafts, MD;  Location: [facility] INVASIVE CV LAB;  Service: Cardiovascular;  Laterality: N/A;   LEFT HEART CATH AND CORONARY ANGIOGRAPHY N/A 05/14/2019   Procedure: LEFT HEART CATH AND CORONARY ANGIOGRAPHY;  Surgeon: Tonny Bollman, MD;  Location: [facility] INVASIVE CV LAB;  Service: Cardiovascular;  Laterality: N/A;   LEFT HEART CATH AND CORONARY ANGIOGRAPHY N/A 01/23/2023   Procedure: LEFT HEART CATH AND CORONARY ANGIOGRAPHY;  Surgeon: Corky Crafts, MD;  Location: [facility] INVASIVE CV LAB;  Service: Cardiovascular;  Laterality: N/A;   LEFT HEART CATHETERIZATION WITH CORONARY ANGIOGRAM N/A 08/14/2012   Procedure: LEFT HEART CATHETERIZATION WITH CORONARY ANGIOGRAM;  Surgeon: Tonny Bollman, MD;  Location: [facility] CATH LAB;  Service: Cardiovascular;  Laterality: N/A;   LEFT HEART CATHETERIZATION WITH CORONARY ANGIOGRAM N/A 08/19/2012   Procedure: LEFT HEART CATHETERIZATION WITH CORONARY ANGIOGRAM;  Surgeon: Tonny Bollman, MD;  Location: [facility] CATH LAB;  Service: Cardiovascular;  Laterality: N/A;   ROTATOR CUFF REPAIR     TONSILLECTOMY     TOTAL ABDOMINAL HYSTERECTOMY       Current Outpatient Medications  Medication Sig Dispense Refill   acetaminophen (TYLENOL) 500 MG tablet Take 500  mg by mouth daily as needed for moderate pain, fever or headache.     Alirocumab (PRALUENT) 75 MG/ML SOAJ Inject 75 mg into the skin every 14 (fourteen) days. 6 mL 3   apixaban (ELIQUIS) 5 MG TABS tablet TAKE 1 TABLET(5 MG) BY MOUTH TWICE DAILY 180 tablet 1   Bempedoic Acid (NEXLETOL) 180 MG TABS TAKE 1 TABLET BY MOUTH DAILY 90 tablet 3   Cholecalciferol (VITAMIN D-3 PO) Take 1 capsule by mouth daily.     Cholecalciferol (VITAMIN D3) 50 MCG (2000 UT) capsule Take 1 capsule (2,000 Units total) by mouth daily.     clopidogrel (PLAVIX) 75 MG  tablet Take 2 tablets (150 mg total) by mouth on day one only, then decrease to taking 1 tablet (75 mg total) by mouth daily thereafter. 92 tablet 1   doxycycline (MONODOX) 50 MG capsule Take 50 mg by mouth every Monday, Wednesday, and Friday.     fluticasone (FLONASE) 50 MCG/ACT nasal spray Place 2 sprays into both nostrils daily as needed for allergies or rhinitis. 48 g 1   metoprolol succinate (TOPROL-XL) 25 MG 24 hr tablet Take 0.5 tablets (12.5 mg total) by mouth at bedtime. 30 tablet 4   Multiple Vitamins-Minerals (MULTIVITAMIN WOMEN 50+) TABS Take 1 tablet by mouth daily.     nitroGLYCERIN (NITROSTAT) 0.4 MG SL tablet Place 1 tablet (0.4 mg total) under the tongue every 5 (five) minutes as needed for chest pain. 25 tablet 3   potassium chloride (KLOR-CON) 10 MEQ tablet TAKE 1 TABLET(10 MEQ) BY MOUTH DAILY 90 tablet 3   Propylene Glycol (SYSTANE COMPLETE) 0.6 % SOLN Place 1 drop into both eyes 3 (three) times daily.     spironolactone (ALDACTONE) 25 MG tablet Take 0.5 tablets (12.5 mg total) by mouth every other day. 30 tablet 3   No current facility-administered medications for this visit.    Allergies:   Effexor [venlafaxine], Flomax [tamsulosin], Macrobid [nitrofurantoin], Other, B-12 compliance injection [cyanocobalamin], Imdur [isosorbide nitrate], Statins, and Zetia [ezetimibe]    Social History:  The patient  reports that she has never smoked. She has never used smokeless tobacco. She reports current alcohol use of about 1.0 standard drink of alcohol per week. She reports that she does not use drugs.   Family History:  The patient's family history includes Alzheimer's disease in her mother; Breast cancer in her maternal aunt; Heart disease in her father.    ROS: Noted in current history.  All other systems are negative.    Physical Exam: Blood pressure 122/78, pulse (!) 48, height 5' (1.524 m), weight 131 lb 12.8 oz (59.8 kg), SpO2 98%.     GEN:  Well nourished, well  developed in no acute distress HEENT: Normal NECK: No JVD; No carotid bruits LYMPHATICS: No lymphadenopathy CARDIAC: RRR , no murmurs, rubs, gallops RESPIRATORY:  Clear to auscultation without rales, wheezing or rhonchi  ABDOMEN: Soft, non-tender, non-distended MUSCULOSKELETAL:  No edema; No deformity  SKIN: Warm and dry NEUROLOGIC:  Alert and oriented x 3      EKG:   .       Recent Labs: 01/22/2023: ALT 25; Magnesium 1.8; TSH 9.927 01/23/2023: BUN 19; Creatinine, Ser 0.89; Hemoglobin 13.2; Platelets 231; Potassium 4.1; Sodium 131    Lipid Panel    Component Value Date/Time   CHOL 150 01/23/2023 0431   CHOL 176 08/02/2022 1013   TRIG 77 01/23/2023 0431   HDL 52 01/23/2023 0431   HDL 49 08/02/2022 1013   CHOLHDL  2.9 01/23/2023 0431   VLDL 15 01/23/2023 0431   LDLCALC 83 01/23/2023 0431   LDLCALC 94 08/02/2022 1013   LDLDIRECT 111.0 04/28/2019 1138      Wt Readings from Last 3 Encounters:  02/01/23 131 lb 12.8 oz (59.8 kg)  01/23/23 132 lb 4.4 oz (60 kg)  12/01/22 133 lb (60.3 kg)      Other studies Reviewed: Additional studies/ records that were reviewed today include: . Review of the above records demonstrates:    ASSESSMENT AND PLAN:  1. Coronary artery disease- Had a NSTEMI several weeks ago Due to severely disease D1 vs. Spasm Will DC ASA Restart Plavix 75 Cont eliquis   She had a very rough hospitalization.  She was ignored by the ER nurses for many hours while she was in chest pain.  She plans on speaking with the hospital patient care representative.    2. Intermittent atrial fibrillation - Continue Eliquis.    3.  hyperlipidemia-she remains on Praluent and bempedoic acid.  Her LDL was 96 which is dramatically improved from her pretreatment time.   Overall lipids look good.    4. Essential HTN:       Current medicines are reviewed at length with the patient today.  The patient does not have concerns regarding medicines.  The  following changes have been made:  no change  Labs/ tests ordered today include:   Orders Placed This Encounter  Procedures   CBC w/Diff      Signed, Kristeen Miss, MD  02/01/2023 5:25 PM    [facility] [address] [place], [place], [place]  [address] Phone: [phone]; Fax: [phone]

## 2023-02-01 ENCOUNTER — Ambulatory Visit: Payer: Medicare Other | Attending: Physician Assistant | Admitting: Cardiovascular Disease

## 2023-02-01 ENCOUNTER — Encounter: Payer: Self-pay | Admitting: Cardiovascular Disease

## 2023-02-01 VITALS — BP 122/78 | HR 48 | Ht 60.0 in | Wt 131.8 lb

## 2023-02-01 DIAGNOSIS — I480 Paroxysmal atrial fibrillation: Secondary | ICD-10-CM

## 2023-02-01 DIAGNOSIS — E7800 Pure hypercholesterolemia, unspecified: Secondary | ICD-10-CM | POA: Insufficient documentation

## 2023-02-01 DIAGNOSIS — Z79899 Other long term (current) drug therapy: Secondary | ICD-10-CM | POA: Diagnosis not present

## 2023-02-01 DIAGNOSIS — I25119 Atherosclerotic heart disease of native coronary artery with unspecified angina pectoris: Secondary | ICD-10-CM | POA: Diagnosis not present

## 2023-02-01 MED ORDER — CLOPIDOGREL BISULFATE 75 MG PO TABS
ORAL_TABLET | ORAL | 1 refills | Status: DC
Start: 2023-02-01 — End: 2023-08-13

## 2023-02-01 MED ORDER — SPIRONOLACTONE 25 MG PO TABS
12.5000 mg | ORAL_TABLET | ORAL | 3 refills | Status: DC
Start: 2023-02-01 — End: 2023-02-20

## 2023-02-01 NOTE — Patient Instructions (Addendum)
Medication Instructions:   STOP TAKING ASPIRIN NOW  START TAKING PLAVIX 75 MG TABLETS--TAKE 2 TABLETS (150 MG TOTAL) THE FIRST DAY ONLY, THEN DECREASE TO TAKING 1 TABLET (75 MG TOTAL) BY MOUTH DAILY THEREAFTER  DECREASE YOUR SPIRONOLACTONE TO 12.5 MG BY MOUTH EVERY OTHER DAY  *If you need a refill on your cardiac medications before your next appointment, please call your pharmacy*   Lab Work:  IN ONE MONTH HERE IN THE OFFICE--CBC W DIFF  If you have labs (blood work) drawn today and your tests are completely normal, you will receive your results only by: MyChart Message (if you have MyChart) OR A paper copy in the mail If you have any lab test that is abnormal or we need to change your treatment, we will call you to review the results.    Follow-Up:  2-3 MONTHS IN THE OFFICE WITH DR. Elease Hashimoto

## 2023-02-02 ENCOUNTER — Telehealth (HOSPITAL_COMMUNITY): Payer: Self-pay

## 2023-02-02 NOTE — Telephone Encounter (Signed)
Called pt to schedule Cardiac Rehab. Pt stated that her husband has had a stoke earlier this year and he is suppose to be scheduled for another surgery coming up soon. She should know more about that when she gets the call to schedule his surgery. She also is battling dizziness. She has talked to her Dr about it and they have changed her meds so she is wanting to wait to see if that helps as well. I did advise pt that I will leave the referral open for 2 weeks then close it. As well as telling her the referral can be reopened and use prior to August of 2025. She understood and stated she would call back within 2 weeks.

## 2023-02-07 ENCOUNTER — Ambulatory Visit: Payer: Medicare Other | Admitting: Physician Assistant

## 2023-02-08 DIAGNOSIS — J342 Deviated nasal septum: Secondary | ICD-10-CM | POA: Diagnosis not present

## 2023-02-08 DIAGNOSIS — H903 Sensorineural hearing loss, bilateral: Secondary | ICD-10-CM | POA: Diagnosis not present

## 2023-02-08 DIAGNOSIS — J310 Chronic rhinitis: Secondary | ICD-10-CM | POA: Diagnosis not present

## 2023-02-08 DIAGNOSIS — J343 Hypertrophy of nasal turbinates: Secondary | ICD-10-CM | POA: Diagnosis not present

## 2023-02-08 DIAGNOSIS — H6983 Other specified disorders of Eustachian tube, bilateral: Secondary | ICD-10-CM | POA: Diagnosis not present

## 2023-02-09 ENCOUNTER — Encounter: Payer: Self-pay | Admitting: Cardiovascular Disease

## 2023-02-12 NOTE — Telephone Encounter (Signed)
Called and spoke with patient. She states yesterday she wasn't dizzy, today she is, so she doesn't feel it's the spironolactone. BP this morning (no spiro) 129/108, HR 57 @11am    Repeat check 132/78, HR 49@12 :40 Didn't check BP Saturday because she was out of town, but was very dizzy to the point she had to hang onto walls and objects to walk around. She reports Friday night she had an episode around 10pm that lasted about an hour and half. She reports chest pressure and a feeling of "slipping" in her chest. Has A-fib so unsure if this was just tthat or something else. BP was 169/99. She took two nitroglycerin and reports that after about an hour, her BP did drop to 130/78. She got relief from the other symptoms rather quickly.  Recently seen by ENT who questioned her on when the dizziness occurs "is the room spinning?" She replied "no," so he states he felt it was stemming from a medication and not an ENT issue. Says that every time she feels the dizziness, she feels as if her body is wanting to fall to the left side. Advised her to try Meclizine OTC to see if this helps her dizziness at all. If not, I recommended she keep an activity log for a few days to see if the dizziness could be orthostasis. She will call us back after both of these, and if still having symptoms, concede to heart monitor.

## 2023-02-16 ENCOUNTER — Ambulatory Visit (INDEPENDENT_AMBULATORY_CARE_PROVIDER_SITE_OTHER): Payer: Medicare Other | Admitting: Family Medicine

## 2023-02-16 ENCOUNTER — Encounter: Payer: Self-pay | Admitting: Family Medicine

## 2023-02-16 ENCOUNTER — Telehealth: Payer: Self-pay

## 2023-02-16 VITALS — BP 98/60 | HR 56 | Temp 98.4°F | Resp 16 | Ht 60.0 in | Wt 133.4 lb

## 2023-02-16 DIAGNOSIS — R7989 Other specified abnormal findings of blood chemistry: Secondary | ICD-10-CM | POA: Diagnosis not present

## 2023-02-16 DIAGNOSIS — Z23 Encounter for immunization: Secondary | ICD-10-CM

## 2023-02-16 DIAGNOSIS — R2689 Other abnormalities of gait and mobility: Secondary | ICD-10-CM | POA: Diagnosis not present

## 2023-02-16 DIAGNOSIS — R001 Bradycardia, unspecified: Secondary | ICD-10-CM | POA: Diagnosis not present

## 2023-02-16 LAB — TSH: TSH: 3.88 u[IU]/mL (ref 0.35–5.50)

## 2023-02-16 NOTE — Assessment & Plan Note (Signed)
Ongoing issue for pt.  She is currently on Metoprolol 12.5mg  daily.  This was just added in August after MI.  She was dizzy prior to starting medication and states HR has been low for 'years'.

## 2023-02-16 NOTE — Patient Instructions (Signed)
Follow up as needed or as scheduled We'll notify you of your lab results and make any changes if needed Drink LOTS of water to try and keep that blood pressure up in the normal range We'll call you with your Neuro appt IF your dizziness changes or worsens, do not hesitate to go to the ER Call with any questions or concerns Hang in there!!

## 2023-02-16 NOTE — Telephone Encounter (Signed)
-----   Message from Neena Rhymes sent at 02/16/2023  1:52 PM EDT ----- TSH is now normal so there is no issue with your thyroid.  This is great news!

## 2023-02-16 NOTE — Progress Notes (Signed)
Subjective:    Patient ID: Shannon Cummings, female    DOB: November 27, 1945, 77 y.o.   MRN: [id number]  HPI Dizziness- pt reports dizziness 'for over a month'.  Pt reports feeling 'off balance' and 'usually to the L'.  Saw Dr Elease Hashimoto who wasn't sure what is causing her dizziness.  He decreased Spironolactone to 1/2 tab and then stopped it entirely.  Pt reports dizziness will 'come and go'.  Pt reports BP is now low after taking meds 'for years'.  Pulse is also low.  Currently on Metoprolol XL 12.5mg  daily- was dizzy prior to Metoprolol.  Denies dizziness when sitting.  Dizziness occurs upon standing and with walking- resolves w/ sitting or lying.  Room is not spinning.    Elevated TSH- pt's TSH during her hospital admission for MI was 9.927   Review of Systems For ROS see HPI     Objective:   Physical Exam Vitals reviewed.  Constitutional:      General: She is not in acute distress.    Appearance: Normal appearance. She is not ill-appearing.  HENT:     Head: Normocephalic and atraumatic.     Right Ear: Tympanic membrane and ear canal normal.     Left Ear: Tympanic membrane and ear canal normal.  Eyes:     Extraocular Movements: Extraocular movements intact.     Conjunctiva/sclera: Conjunctivae normal.     Pupils: Pupils are equal, round, and reactive to light.  Cardiovascular:     Rate and Rhythm: Regular rhythm. Bradycardia present.  Pulmonary:     Effort: Pulmonary effort is normal. No respiratory distress.     Breath sounds: No wheezing or rhonchi.  Skin:    General: Skin is warm and dry.  Neurological:     Mental Status: She is alert and oriented to person, place, and time.     Cranial Nerves: No cranial nerve deficit.     Motor: No weakness.     Coordination: Coordination normal.     Gait: Gait normal.     Deep Tendon Reflexes: Reflexes normal.  Psychiatric:        Mood and Affect: Mood normal.        Behavior: Behavior normal.        Thought Content: Thought content  normal.           Assessment & Plan:  Balance Problem- new.  Pt reports this has been going on for 4-6 weeks.  Started prior to MI in August.  Is not consistent w/ vertigo or orthostatic hypotension (even though she does have low BP and HR).  She reports feeling off balance and listing to the L when she walks.  Sxs will come and go, but present more often than not.  Asymptomatic when sitting or lying down.  Some concern for a CVA.  Pt needs MRI but suspect that Neuro will be able to get this approved more easily.  Referral placed.  Elevated TSH- new.  Noted during hospitalization.  May have had sick thyroid syndrome so will recheck today to see if back in normal range.  Will tx prn.

## 2023-02-20 ENCOUNTER — Ambulatory Visit (INDEPENDENT_AMBULATORY_CARE_PROVIDER_SITE_OTHER): Payer: Medicare Other | Admitting: Neurology

## 2023-02-20 ENCOUNTER — Encounter: Payer: Self-pay | Admitting: Neurology

## 2023-02-20 ENCOUNTER — Telehealth: Payer: Self-pay | Admitting: Neurology

## 2023-02-20 VITALS — BP 144/80 | HR 83 | Ht 60.0 in | Wt 135.0 lb

## 2023-02-20 DIAGNOSIS — R269 Unspecified abnormalities of gait and mobility: Secondary | ICD-10-CM | POA: Insufficient documentation

## 2023-02-20 DIAGNOSIS — R42 Dizziness and giddiness: Secondary | ICD-10-CM | POA: Diagnosis not present

## 2023-02-20 NOTE — Progress Notes (Signed)
Chief Complaint  Patient presents with   New Patient (Initial Visit)    Rm12, alone, internal referral for balance problem, dizziness:ongoing since 2 months ago      ASSESSMENT AND PLAN  Shannon Cummings is a 77 y.o. female   Dizziness, unsteady gait,  Multiple coronary artery disease, hyperlipidemia, high risk for cerebrovascular event  MRI of the brain with without contrast  Keep Eliquis 5 mg twice a day, also on Plavix 75 mg daily   DIAGNOSTIC DATA (LABS, IMAGING, TESTING) - I reviewed patient records, labs, notes, testing and imaging myself where available.   MEDICAL HISTORY:  Shannon Cummings is a 77 year old female, seen in request by her primary care doctor Neena Rhymes for eval relation of unsteady gait  I reviewed and summarized the referring note. PMHX CAD A fib HLD  Around July 2024, after she recovered from middle ear infection, she noticed gradual onset dizziness, no room spinning, off-balance sensation, tends to lean towards the left side, some days worse than the others, symptoms has been persistent since its onset, yesterday when she looked up and, she felt so unsteady, had to hold onto things, denied lateralized motor or sensory deficit  Most recent heart attack was on August 19, echocardiogram showed no significant abnormality  Cardiac catheterization  Ost 1st Diag lesion is 70% stenosed.  Similar to prior.   Ost 1st Mrg lesion is 25% stenosed.   RPDA lesion is 50% stenosed.   Dist Cx lesion is 50% stenosed.   Non-stenotic Prox LAD lesion was previously treated.   Non-stenotic 1st Mrg lesion was previously treated.  She is now taking Eliquis, and Plavix 75 mg daily, LDL 83, lipoprotein 67.6, normal TSH, A1c 5.6, mild low sodium 131 chloride 97, calcium 8.5  Carotid artery showed no large vessel disease  PHYSICAL EXAM:   Vitals:   02/20/23 1331  BP: (!) 144/80  Pulse: 83  Weight: 135 lb (61.2 kg)  Height: 5' (1.524 m)   Sitting down  135/89, HR 58 Standing up 119/70, HR 60 Standing up one minute 122/78, HR 62.  Body mass index is 26.37 kg/m.  PHYSICAL EXAMNIATION:  Gen: NAD, conversant, well nourised, well groomed                     Cardiovascular: Regular rate rhythm, no peripheral edema, warm, nontender. Eyes: Conjunctivae clear without exudates or hemorrhage Neck: Supple, no carotid bruits. Pulmonary: Clear to auscultation bilaterally   NEUROLOGICAL EXAM:  MENTAL STATUS: Speech/cognition: Awake, alert, oriented to history taking and casual conversation CRANIAL NERVES: CN II: Visual fields are full to confrontation. Pupils are round equal and briskly reactive to light. CN III, IV, VI: extraocular movement are normal. No ptosis. CN V: Facial sensation is intact to light touch CN VII: Face is symmetric with normal eye closure  CN VIII: Hearing is normal to causal conversation. CN IX, X: Phonation is normal. CN XI: Head turning and shoulder shrug are intact  MOTOR: There is no pronator drift of out-stretched arms. Muscle bulk and tone are normal. Muscle strength is normal.  REFLEXES: Reflexes are 1 and symmetric at the biceps, triceps, knees, and ankles. Plantar responses are flexor.  SENSORY: Intact to light touch, pinprick and vibratory sensation are intact in fingers and toes.  COORDINATION: There is no trunk or limb dysmetria noted.  GAIT/STANCE: Able to get up arm crossed, cautious, mildly unsteady gait difficulty perform tandem walking tends to lean towards the left side  REVIEW OF SYSTEMS:  Full 14 system review of systems performed and notable only for as above All other review of systems were negative.   ALLERGIES: Allergies  Allergen Reactions   Effexor [Venlafaxine] Other (See Comments)    Sedation    Flomax [Tamsulosin] Other (See Comments)    Hypotension Near syncope Weakness Vertigo   Macrobid [Nitrofurantoin] Diarrhea, Nausea Only and Other (See Comments)    Severe  abdominal pain   Other Anxiety and Other (See Comments)    Intolerance to strong pain medications=  Make her feel anxious and feel like coming out of her skin   B-12 Compliance Injection [Cyanocobalamin] Other (See Comments)    Headaches    Imdur [Isosorbide Nitrate] Other (See Comments)    Headaches    Statins Other (See Comments)    Restless legs Bad feeling  Symptoms occurred while taking Crestor 5mg  (once weekly), Lipitor 10mg  (twice weekly) and Simvastatin  (daily).   Zetia [Ezetimibe] Other (See Comments)    Myalgias    HOME MEDICATIONS: Current Outpatient Medications  Medication Sig Dispense Refill   acetaminophen (TYLENOL) 500 MG tablet Take 500 mg by mouth daily as needed for moderate pain, fever or headache.     Alirocumab (PRALUENT) 75 MG/ML SOAJ Inject 75 mg into the skin every 14 (fourteen) days. 6 mL 3   apixaban (ELIQUIS) 5 MG TABS tablet TAKE 1 TABLET(5 MG) BY MOUTH TWICE DAILY 180 tablet 1   Bempedoic Acid (NEXLETOL) 180 MG TABS TAKE 1 TABLET BY MOUTH DAILY 90 tablet 3   Cholecalciferol (VITAMIN D3) 50 MCG (2000 UT) capsule Take 1 capsule (2,000 Units total) by mouth daily.     clopidogrel (PLAVIX) 75 MG tablet Take 2 tablets (150 mg total) by mouth on day one only, then decrease to taking 1 tablet (75 mg total) by mouth daily thereafter. 92 tablet 1   doxycycline (MONODOX) 50 MG capsule Take 50 mg by mouth every Monday, Wednesday, and Friday.     fluticasone (FLONASE) 50 MCG/ACT nasal spray Place 2 sprays into both nostrils daily as needed for allergies or rhinitis. 48 g 1   metoprolol succinate (TOPROL-XL) 25 MG 24 hr tablet Take 0.5 tablets (12.5 mg total) by mouth at bedtime. 30 tablet 4   Multiple Vitamins-Minerals (MULTIVITAMIN WOMEN 50+) TABS Take 1 tablet by mouth daily.     nitroGLYCERIN (NITROSTAT) 0.4 MG SL tablet Place 1 tablet (0.4 mg total) under the tongue every 5 (five) minutes as needed for chest pain. 25 tablet 3   potassium chloride (KLOR-CON) 10 MEQ  tablet TAKE 1 TABLET(10 MEQ) BY MOUTH DAILY 90 tablet 3   Propylene Glycol (SYSTANE COMPLETE) 0.6 % SOLN Place 1 drop into both eyes 3 (three) times daily.     No current facility-administered medications for this visit.    PAST MEDICAL HISTORY: Past Medical History:  Diagnosis Date   Arthritis    CAD (coronary artery disease)    a. prior stenting history. b. NSTEMI s/p DES to prox LAD with EF 45% by cath 08/14/12. c. Mild trop elevation shortly after cath ?mild plaque embolization - patent stent on relook. // d. Myoview 8/18: EF 60, normal perfusion; Low Risk. e. NSTEMI 06/2018- patent prior LAD stent, 80% OM1 s/p DES, normal LVEDP, EF 45-50%, moderate residual disease treated medically.   Fatty infiltration of liver    GERD (gastroesophageal reflux disease)    Hyperlipidemia    Hypertension    Hypokalemia    Hypotension  Myocardial infarction (HCC) 2014   OSA on CPAP    Osteoporosis    PAF (paroxysmal atrial fibrillation) (HCC)    On rythmol previously   Pulmonary nodule    a. 8mm subpleural nodular density CT 08/2012, instructed to f/u pulm MD.   Sinus bradycardia    Tubular adenoma of colon 2007    PAST SURGICAL HISTORY: Past Surgical History:  Procedure Laterality Date   ATRIAL FIBRILLATION ABLATION N/A 07/27/2020   Procedure: ATRIAL FIBRILLATION ABLATION;  Surgeon: Hillis Range, MD;  Location: [facility] INVASIVE CV LAB;  Service: Cardiovascular;  Laterality: N/A;   COLONOSCOPY  03/07/2016   COLONOSCOPY WITH PROPOFOL  07/04/2021   CORONARY ANGIOPLASTY WITH STENT PLACEMENT  08/14/2012   LAD  Dr Tonny Bollman   CORONARY STENT INTERVENTION N/A 06/14/2018   Procedure: CORONARY STENT INTERVENTION;  Surgeon: Corky Crafts, MD;  Location: [facility] INVASIVE CV LAB;  Service: Cardiovascular;  Laterality: N/A;   CORONARY STENT PLACEMENT  2000   hair restoration  02/2017   LEFT HEART CATH AND CORONARY ANGIOGRAPHY N/A 06/14/2018   Procedure: LEFT HEART CATH AND CORONARY ANGIOGRAPHY;   Surgeon: Corky Crafts, MD;  Location: [facility] INVASIVE CV LAB;  Service: Cardiovascular;  Laterality: N/A;   LEFT HEART CATH AND CORONARY ANGIOGRAPHY N/A 05/14/2019   Procedure: LEFT HEART CATH AND CORONARY ANGIOGRAPHY;  Surgeon: Tonny Bollman, MD;  Location: [facility] INVASIVE CV LAB;  Service: Cardiovascular;  Laterality: N/A;   LEFT HEART CATH AND CORONARY ANGIOGRAPHY N/A 01/23/2023   Procedure: LEFT HEART CATH AND CORONARY ANGIOGRAPHY;  Surgeon: Corky Crafts, MD;  Location: [facility] INVASIVE CV LAB;  Service: Cardiovascular;  Laterality: N/A;   LEFT HEART CATHETERIZATION WITH CORONARY ANGIOGRAM N/A 08/14/2012   Procedure: LEFT HEART CATHETERIZATION WITH CORONARY ANGIOGRAM;  Surgeon: Tonny Bollman, MD;  Location[facility] C[facility] LAB;  Service: Cardiovascular;  Laterality: N/A;   LEFT HEART CATHETERIZATION WITH CORONARY ANGIOGRAM N/A 08/19/2012   Procedure: LEFT HEART CATHETERIZATION WITH CORONARY ANGIOGRAM;  Surgeon: Tonny Bollman, MD;  Location: [facility] CATH LAB;  Service: Cardiovascular;  Laterality: N/A;   ROTATOR CUFF REPAIR     TONSILLECTOMY     TOTAL ABDOMINAL HYSTERECTOMY      FAMILY HISTORY: Family History  Problem Relation Age of Onset   Heart disease Father    Alzheimer's disease Mother    Breast cancer Maternal Aunt    Colon cancer Neg Hx     SOCIAL HISTORY: Social History   Socioeconomic History   Marital status: Married    Spouse name: Not on file   Number of children: 2   Years of education: Not on file   Highest education level: Not on file  Occupational History   Occupation: Retired    Associate Professor: [employer]     Comment: works part time in [employer] store  Tobacco Use   Smoking status: Never   Smokeless tobacco: Never  Vaping Use   Vaping status: Never Used  Substance and Sexual Activity   Alcohol use: Yes    Alcohol/week: 1.0 standard drink of alcohol    Types: 1 Glasses of wine per week    Comment: 2 drinks daily    Drug use: No   Sexual activity: Not Currently   Other Topics Concern   Not on file  Social History Narrative   Daily caffeine    Lives in [place]   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   Social Determinants of Health   Financial Resource Strain: Low Risk  (02/16/2023)  Overall Financial Resource Strain (CARDIA)    Difficulty of Paying Living Expenses: Not hard at all  Food Insecurity: No Food Insecurity (02/16/2023)   Hunger Vital Sign    Worried About Running Out of Food in the Last Year: Never true    Ran Out of Food in the Last Year: Never true  Transportation Needs: No Transportation Needs (02/16/2023)   PRAPARE - Administrator, Civil Service (Medical): No    Lack of Transportation (Non-Medical): No  Physical Activity: Sufficiently Active (02/16/2023)   Exercise Vital Sign    Days of Exercise per Week: 3 days    Minutes of Exercise per Session: 50 min  Stress: No Stress Concern Present (02/16/2023)   [employer] of Occupational Health - Occupational Stress Questionnaire    Feeling of Stress : Not at all  Social Connections: Unknown (02/16/2023)   Social Connection and Isolation Panel [NHANES]    Frequency of Communication with Friends and Family: More than three times a week    Frequency of Social Gatherings with Friends and Family: Once a week    Attends Religious Services: Patient declined    Database administrator or Organizations: No    Attends Banker Meetings: Never    Marital Status: Married  Catering manager Violence: Not At Risk (01/23/2023)   Humiliation, Afraid, Rape, and Kick questionnaire    Fear of Current or Ex-Partner: No    Emotionally Abused: No    Physically Abused: No    Sexually Abused: No      Levert Feinstein, M.D. Ph.D.  [facility] [address] [place], [place] [address] Ph: [phone] Fax: [phone]  CC:  Sheliah Hatch, MD [address] [place],  [place] [address]  Sheliah Hatch, MD

## 2023-02-20 NOTE — Telephone Encounter (Signed)
medicare/[employer]ARP NPR sent to [facility] [id number]

## 2023-02-28 ENCOUNTER — Ambulatory Visit
Admission: RE | Admit: 2023-02-28 | Discharge: 2023-02-28 | Disposition: A | Discharge status: Home / Self Care | Payer: Medicare Other | Source: Ambulatory Visit | Attending: Neurology | Admitting: Neurology

## 2023-02-28 DIAGNOSIS — R269 Unspecified abnormalities of gait and mobility: Secondary | ICD-10-CM

## 2023-02-28 DIAGNOSIS — R2689 Other abnormalities of gait and mobility: Secondary | ICD-10-CM | POA: Diagnosis not present

## 2023-02-28 DIAGNOSIS — R42 Dizziness and giddiness: Secondary | ICD-10-CM | POA: Diagnosis not present

## 2023-02-28 DIAGNOSIS — I6782 Cerebral ischemia: Secondary | ICD-10-CM | POA: Diagnosis not present

## 2023-02-28 MED ORDER — GADOPICLENOL 0.5 MMOL/ML IV SOLN
6.0000 mL | Freq: Once | INTRAVENOUS | Status: AC | PRN
  Administered 2023-02-28: 6 mL via INTRAVENOUS

## 2023-03-02 ENCOUNTER — Ambulatory Visit (INDEPENDENT_AMBULATORY_CARE_PROVIDER_SITE_OTHER): Payer: Medicare Other | Admitting: Family Medicine

## 2023-03-02 ENCOUNTER — Encounter: Payer: Self-pay | Admitting: Family Medicine

## 2023-03-02 VITALS — BP 120/78 | HR 60 | Temp 97.9°F | Wt 134.4 lb

## 2023-03-02 DIAGNOSIS — H00014 Hordeolum externum left upper eyelid: Secondary | ICD-10-CM

## 2023-03-02 DIAGNOSIS — J0100 Acute maxillary sinusitis, unspecified: Secondary | ICD-10-CM

## 2023-03-02 MED ORDER — AMOXICILLIN-POT CLAVULANATE 875-125 MG PO TABS: 1.0000 | ORAL_TABLET | Freq: Two times a day (BID) | ORAL | 0 refills | Status: AC

## 2023-03-02 NOTE — Patient Instructions (Signed)
Follow up as needed or as scheduled START the Augmentin- take w/ food CONTINUE the hot compresses If no better early next week- call your eye doctor Call with any questions or concerns Hang in there!!!

## 2023-03-02 NOTE — Progress Notes (Unsigned)
   Subjective:    Patient ID: Shannon Cummings, female    DOB: June 09, 1945, 77 y.o.   MRN: [id number]  HPI Stye- pt reports she has done hot compresses and baby shampoo and area has seemingly come to a head.  Now having pain and pressure in sinuses on L side (same side as stye).  Using flonase regularly.  Stye is tender but now itching.  No drainage.     Review of Systems For ROS see HPI     Objective:   Physical Exam Vitals reviewed.  Constitutional:      General: She is not in acute distress.    Appearance: Normal appearance. She is not ill-appearing.  HENT:     Head: Normocephalic and atraumatic.     Right Ear: Tympanic membrane and ear canal normal.     Left Ear: Tympanic membrane and ear canal normal.     Nose: No congestion.     Right Sinus: No maxillary sinus tenderness or frontal sinus tenderness.     Left Sinus: Maxillary sinus tenderness present. No frontal sinus tenderness.     Mouth/Throat:     Mouth: Mucous membranes are moist.     Pharynx: No oropharyngeal exudate.  Eyes:     General: Vision grossly intact.        Right eye: No discharge or hordeolum.        Left eye: Hordeolum (upper lid, minimally TTP) present.No discharge.     Extraocular Movements: Extraocular movements intact.     Conjunctiva/sclera: Conjunctivae normal.  Neurological:     Mental Status: She is alert.           Assessment & Plan:  L maxillary sinusitis- new.  Pt w/ TTP over sinus and some swelling.  Will start Augmentin to cover both sinuses and possible skin infection (stye).  Reviewed supportive care and red flags that should prompt return.  Pt expressed understanding and is in agreement w/ plan.   Stye- ongoing.  Pt wants me to drain area today in office.  Told her I'm not comfortable making an incision on eye lid.  Hot compresses have brought area closer to a head.  Encouraged her to continue.  Also discussed that the abx for her sinusitis will treat any underlying skin infxn.  If no  improvement by early next week, encouraged her to contact eye doctor.  Pt expressed understanding and is in agreement w/ plan.

## 2023-03-06 ENCOUNTER — Ambulatory Visit: Payer: Medicare Other | Attending: Cardiovascular Disease

## 2023-03-06 DIAGNOSIS — I480 Paroxysmal atrial fibrillation: Secondary | ICD-10-CM | POA: Diagnosis not present

## 2023-03-06 DIAGNOSIS — Z79899 Other long term (current) drug therapy: Secondary | ICD-10-CM

## 2023-03-06 DIAGNOSIS — E7800 Pure hypercholesterolemia, unspecified: Secondary | ICD-10-CM

## 2023-03-06 DIAGNOSIS — I25119 Atherosclerotic heart disease of native coronary artery with unspecified angina pectoris: Secondary | ICD-10-CM | POA: Diagnosis not present

## 2023-03-07 LAB — CBC WITH DIFFERENTIAL/PLATELET
Basophils Absolute: 0 10*3/uL (ref 0.0–0.2)
Basos: 1 %
EOS (ABSOLUTE): 0.1 10*3/uL (ref 0.0–0.4)
Eos: 1 %
Hematocrit: 40.1 % (ref 34.0–46.6)
Hemoglobin: 13.4 g/dL (ref 11.1–15.9)
Immature Grans (Abs): 0 10*3/uL (ref 0.0–0.1)
Immature Granulocytes: 0 %
Lymphocytes Absolute: 2 10*3/uL (ref 0.7–3.1)
Lymphs: 38 %
MCH: 30 pg (ref 26.6–33.0)
MCHC: 33.4 g/dL (ref 31.5–35.7)
MCV: 90 fL (ref 79–97)
Monocytes Absolute: 0.4 10*3/uL (ref 0.1–0.9)
Monocytes: 8 %
Neutrophils Absolute: 2.7 10*3/uL (ref 1.4–7.0)
Neutrophils: 52 %
Platelets: 245 10*3/uL (ref 150–450)
RBC: 4.47 x10E6/uL (ref 3.77–5.28)
RDW: 12.6 % (ref 11.7–15.4)
WBC: 5.3 10*3/uL (ref 3.4–10.8)

## 2023-03-13 DIAGNOSIS — L821 Other seborrheic keratosis: Secondary | ICD-10-CM | POA: Diagnosis not present

## 2023-03-13 DIAGNOSIS — L820 Inflamed seborrheic keratosis: Secondary | ICD-10-CM | POA: Diagnosis not present

## 2023-03-13 DIAGNOSIS — L570 Actinic keratosis: Secondary | ICD-10-CM | POA: Diagnosis not present

## 2023-03-21 ENCOUNTER — Ambulatory Visit (INDEPENDENT_AMBULATORY_CARE_PROVIDER_SITE_OTHER): Payer: Medicare Other | Admitting: *Deleted

## 2023-03-21 DIAGNOSIS — Z0000 Encounter for general adult medical examination without abnormal findings: Secondary | ICD-10-CM | POA: Diagnosis not present

## 2023-03-21 NOTE — Progress Notes (Signed)
Subjective:   Shannon Cummings is a 77 y.o. female who presents for Medicare Annual (Subsequent) preventive examination.  Visit Complete: Virtual I connected with  Shannon Cummings on 03/21/23 by a audio enabled telemedicine application and verified that I am speaking with the correct person using two identifiers.  Patient Location: Home  Provider Location: Home Office  I discussed the limitations of evaluation and management by telemedicine. The patient expressed understanding and agreed to proceed.  Vital Signs: Because this visit was a virtual/telehealth visit, some criteria may be missing or patient reported. Any vitals not documented were not able to be obtained and vitals that have been documented are patient reported.  Cardiac Risk Factors include: advanced age (>58men, >61 women);hypertension;family history of premature cardiovascular disease     Objective:    There were no vitals filed for this visit. There is no height or weight on file to calculate BMI.     03/21/2023    1:38 PM 01/23/2023   11:55 AM 06/02/2022   12:11 PM 03/15/2022   11:27 AM 10/29/2020    4:48 PM 07/27/2020    5:58 AM 05/03/2020   11:09 AM  Advanced Directives  Does Patient Have a Medical Advance Directive? Yes Yes Yes Yes Yes Yes Yes  Type of Estate agent of [employer] Power of [place];Living will  Healthcare Power of [place];Living will Healthcare Power of [place];Living will Healthcare Power of [place];Living will Healthcare Power of [place];Living will  Does patient want to make changes to medical advance directive?  No - Patient declined No - Patient declined   No - Patient declined   Copy of Healthcare Power of Attorney in Chart? No - copy requested No - copy requested  No - copy requested  No - copy requested No - copy requested    Current Medications (verified) Outpatient Encounter Medications as of 03/21/2023  Medication Sig   acetaminophen (TYLENOL)  500 MG tablet Take 500 mg by mouth daily as needed for moderate pain, fever or headache.   Alirocumab (PRALUENT) 75 MG/ML SOAJ Inject 75 mg into the skin every 14 (fourteen) days.   apixaban (ELIQUIS) 5 MG TABS tablet TAKE 1 TABLET(5 MG) BY MOUTH TWICE DAILY   Bempedoic Acid (NEXLETOL) 180 MG TABS TAKE 1 TABLET BY MOUTH DAILY   Cholecalciferol (VITAMIN D3) 50 MCG (2000 UT) capsule Take 1 capsule (2,000 Units total) by mouth daily.   clopidogrel (PLAVIX) 75 MG tablet Take 2 tablets (150 mg total) by mouth on day one only, then decrease to taking 1 tablet (75 mg total) by mouth daily thereafter.   doxycycline (MONODOX) 50 MG capsule Take 50 mg by mouth every Monday, Wednesday, and Friday.   fluticasone (FLONASE) 50 MCG/ACT nasal spray Place 2 sprays into both nostrils daily as needed for allergies or rhinitis.   metoprolol succinate (TOPROL-XL) 25 MG 24 hr tablet Take 0.5 tablets (12.5 mg total) by mouth at bedtime.   Multiple Vitamins-Minerals (MULTIVITAMIN WOMEN 50+) TABS Take 1 tablet by mouth daily.   nitroGLYCERIN (NITROSTAT) 0.4 MG SL tablet Place 1 tablet (0.4 mg total) under the tongue every 5 (five) minutes as needed for chest pain.   potassium chloride (KLOR-CON) 10 MEQ tablet TAKE 1 TABLET(10 MEQ) BY MOUTH DAILY   Propylene Glycol (SYSTANE COMPLETE) 0.6 % SOLN Place 1 drop into both eyes 3 (three) times daily.   No facility-administered encounter medications on file as of 03/21/2023.    Allergies (verified) Effexor [venlafaxine], Flomax [tamsulosin], Macrobid [  nitrofurantoin], Other, B-12 compliance injection [cyanocobalamin], Imdur [isosorbide nitrate], Statins, and Zetia [ezetimibe]   History: Past Medical History:  Diagnosis Date   Arthritis    CAD (coronary artery disease)    a. prior stenting history. b. NSTEMI s/p DES to prox LAD with EF 45% by cath 08/14/12. c. Mild trop elevation shortly after cath ?mild plaque embolization - patent stent on relook. // d. Myoview 8/18: EF 60,  normal perfusion; Low Risk. e. NSTEMI 06/2018- patent prior LAD stent, 80% OM1 s/p DES, normal LVEDP, EF 45-50%, moderate residual disease treated medically.   Fatty infiltration of liver    GERD (gastroesophageal reflux disease)    Hyperlipidemia    Hypertension    Hypokalemia    Hypotension    Myocardial infarction (HCC) 2014   OSA on CPAP    Osteoporosis    PAF (paroxysmal atrial fibrillation) (HCC)    On rythmol previously   Pulmonary nodule    a. 8mm subpleural nodular density CT 08/2012, instructed to f/u pulm MD.   Sinus bradycardia    Tubular adenoma of colon 2007   Past Surgical History:  Procedure Laterality Date   ATRIAL FIBRILLATION ABLATION N/A 07/27/2020   Procedure: ATRIAL FIBRILLATION ABLATION;  Surgeon: Hillis Range, MD;  Location: [facility] INVASIVE CV LAB;  Service: Cardiovascular;  Laterality: N/A;   COLONOSCOPY  03/07/2016   COLONOSCOPY WITH PROPOFOL  07/04/2021   CORONARY ANGIOPLASTY WITH STENT PLACEMENT  08/14/2012   LAD  Dr Tonny Bollman   CORONARY STENT INTERVENTION N/A 06/14/2018   Procedure: CORONARY STENT INTERVENTION;  Surgeon: Corky Crafts, MD;  Location: [facility] INVASIVE CV LAB;  Service: Cardiovascular;  Laterality: N/A;   CORONARY STENT PLACEMENT  2000   hair restoration  02/2017   LEFT HEART CATH AND CORONARY ANGIOGRAPHY N/A 06/14/2018   Procedure: LEFT HEART CATH AND CORONARY ANGIOGRAPHY;  Surgeon: Corky Crafts, MD;  Location: [facility] INVASIVE CV LAB;  Service: Cardiovascular;  Laterality: N/A;   LEFT HEART CATH AND CORONARY ANGIOGRAPHY N/A 05/14/2019   Procedure: LEFT HEART CATH AND CORONARY ANGIOGRAPHY;  Surgeon: Tonny Bollman, MD;  Location: [facility] INVASIVE CV LAB;  Service: Cardiovascular;  Laterality: N/A;   LEFT HEART CATH AND CORONARY ANGIOGRAPHY N/A 01/23/2023   Procedure: LEFT HEART CATH AND CORONARY ANGIOGRAPHY;  Surgeon: Corky Crafts, MD;  Location: [facility] INVASIVE CV LAB;  Service: Cardiovascular;  Laterality: N/A;   LEFT HEART  CATHETERIZATION WITH CORONARY ANGIOGRAM N/A 08/14/2012   Procedure: LEFT HEART CATHETERIZATION WITH CORONARY ANGIOGRAM;  Surgeon: Tonny Bollman, MD;  Location: [facility] CATH LAB;  Service: Cardiovascular;  Laterality: N/A;   LEFT HEART CATHETERIZATION WITH CORONARY ANGIOGRAM N/A 08/19/2012   Procedure: LEFT HEART CATHETERIZATION WITH CORONARY ANGIOGRAM;  Surgeon: Tonny Bollman, MD;  Location: [facility] CATH LAB;  Service: Cardiovascular;  Laterality: N/A;   ROTATOR CUFF REPAIR     TONSILLECTOMY     TOTAL ABDOMINAL HYSTERECTOMY     Family History  Problem Relation Age of Onset   Heart disease Father    Alzheimer's disease Mother    Breast cancer Maternal Aunt    Colon cancer Neg Hx    Social History   Socioeconomic History   Marital status: Married    Spouse name: Not on file   Number of children: 2   Years of education: Not on file   Highest education level: Not on file  Occupational History   Occupation: Retired    Associate Professor: [employer]     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  Tobacco Use   Smoking status: Never   Smokeless tobacco: Never  Vaping Use   Vaping status: Never Used  Substance and Sexual Activity   Alcohol use: Yes    Alcohol/week: 1.0 standard drink of alcohol    Types: 1 Glasses of wine per week    Comment: 2 drinks daily    Drug use: No   Sexual activity: Not Currently  Other Topics Concern   Not on file  Social History Narrative   Daily caffeine    Lives in [place]   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   Social Determinants of Health   Financial Resource Strain: Low Risk  (03/21/2023)   Overall Financial Resource Strain (CARDIA)    Difficulty of Paying Living Expenses: Not hard at all  Food Insecurity: No Food Insecurity (03/21/2023)   Hunger Vital Sign    Worried About Running Out of Food in the Last Year: Never true    Ran Out of Food in the Last Year: Never true  Transportation Needs: No Transportation Needs (03/21/2023)   PRAPARE -  Administrator, Civil Service (Medical): No    Lack of Transportation (Non-Medical): No  Physical Activity: Inactive (03/21/2023)   Exercise Vital Sign    Days of Exercise per Week: 0 days    Minutes of Exercise per Session: 0 min  Stress: No Stress Concern Present (03/21/2023)   [employer] of Occupational Health - Occupational Stress Questionnaire    Feeling of Stress : Not at all  Social Connections: Moderately Isolated (03/21/2023)   Social Connection and Isolation Panel [NHANES]    Frequency of Communication with Friends and Family: More than three times a week    Frequency of Social Gatherings with Friends and Family: More than three times a week    Attends Religious Services: Never    Database administrator or Organizations: No    Attends Engineer, structural: Never    Marital Status: Married    Tobacco Counseling Counseling given: Not Answered   Clinical Intake:  Pre-visit preparation completed: Yes  Pain : No/denies pain     Diabetes: No  How often do you need to have someone help you when you read instructions, pamphlets, or other written materials from your doctor or pharmacy?: 1 - Never  Interpreter Needed?: No  Information entered by :: Remi Haggard LPn   Activities of Daily Living    03/21/2023    1:38 PM 01/23/2023   11:48 AM  In your present state of health, do you have any difficulty performing the following activities:  Hearing? 0 0  Vision? 0 0  Difficulty concentrating or making decisions? 0 0  Walking or climbing stairs? 0 1  Dressing or bathing? 0 0  Doing errands, shopping? 0 0  Using the Toilet? N   In the past six months, have you accidently leaked urine? N   Do you have problems with loss of bowel control? N   Managing your Medications? N   Managing your Finances? N   Housekeeping or managing your Housekeeping? N     Patient Care Team: Sheliah Hatch, MD as PCP - General (Family Medicine) Nahser,  Deloris Ping, MD as PCP - Cardiology (Cardiology) Hillis Range, MD (Inactive) as PCP - Electrophysiology (Cardiology) Key, Verita Schneiders, NP as Nurse Practitioner (Gynecology) Jacqualyn Posey, grant (Dermatology) Sharyn Lull, Elvin So, MD as Referring Physician (Dermatology) Nahser, Deloris Ping, MD as Consulting Physician (Cardiology) Alfredo Martinez, MD  as Consulting Physician (Urology) Meryl Dare, MD as Consulting Physician (Gastroenterology)  Indicate any recent Medical Services you may have received from other than [facility] providers in the past year (date may be approximate).     Assessment:   This is a routine wellness examination for Ellysa.  Hearing/Vision screen Hearing Screening - Comments:: Does not wear hearing aids No trouble hearing Vision Screening - Comments:: [facility] Up to date   Goals Addressed             This Visit's Progress    Increase physical activity         Depression Screen    03/21/2023    1:41 PM 02/16/2023   10:45 AM 12/01/2022    8:56 AM 03/15/2022   11:26 AM 10/18/2021   10:12 AM 10/25/2020   11:31 AM 05/03/2020   11:11 AM  PHQ 2/9 Scores  PHQ - 2 Score 0 0 0 0 0 0 1  PHQ- 9 Score 0 0 1  3 0     Fall Risk    03/21/2023    1:35 PM 02/16/2023   10:45 AM 12/01/2022    8:56 AM 11/23/2022   10:34 AM 03/15/2022   11:24 AM  Fall Risk   Falls in the past year? 0 0 0 0 0  Number falls in past yr: 0 0 0 0 0  Injury with Fall? 0 0 0 0 0  Risk for fall due to :  No Fall Risks No Fall Risks No Fall Risks No Fall Risks  Follow up Falls evaluation completed;Education provided;Falls prevention discussed Falls evaluation completed Falls evaluation completed Falls evaluation completed Falls prevention discussed    MEDICARE RISK AT HOME: Medicare Risk at Home Any stairs in or around the home?: Yes If so, are there any without handrails?: No Home free of loose throw rugs in walkways, pet beds, electrical cords, etc?: Yes Adequate lighting in your home  to reduce risk of falls?: Yes Life alert?: No Use of a cane, walker or w/c?: No Grab bars in the bathroom?: Yes Shower chair or bench in shower?: Yes Elevated toilet seat or a handicapped toilet?: Yes  TIMED UP AND GO:  Was the test performed?  No    Cognitive Function:    05/02/2017    2:19 PM  MMSE - Mini Mental State Exam  Orientation to time 5  Orientation to Place 5  Registration 2  Attention/ Calculation 5  Recall 3  Language- name 2 objects 2  Language- repeat 1  Language- follow 3 step command 3  Language- read & follow direction 1  Write a sentence 1  Copy design 1  Total score 29        03/21/2023    1:39 PM 03/15/2022   11:29 AM 05/03/2020   11:27 AM  6CIT Screen  What Year? 0 points 0 points 0 points  What month? 0 points 0 points 0 points  What time? 0 points 0 points 0 points  Count back from 20 0 points 0 points 0 points  Months in reverse 0 points 0 points 0 points  Repeat phrase  0 points 0 points  Total Score  0 points 0 points    Immunizations Immunization History  Administered Date(s) Administered   Fluad Quad(high Dose 65+) 03/04/2019, 05/03/2020   Fluad Trivalent(High Dose 65+) 02/16/2023   Influenza Split 03/06/2011, 03/05/2012   Influenza Whole 04/06/2010   Influenza, High Dose Seasonal PF 01/04/2016, 04/10/2018   Influenza,inj,Quad  PF,6+ Mos 03/26/2017   PFIZER Comirnaty(Gray Top)Covid-19 Tri-Sucrose Vaccine 11/04/2020   PFIZER(Purple Top)SARS-COV-2 Vaccination 07/15/2019, 08/12/2019   Pneumococcal Conjugate-13 07/10/2016   Pneumococcal Polysaccharide-23 08/15/2012   Zoster Recombinant(Shingrix) 07/13/2020, 07/14/2020   Zoster, Live 07/10/2012    TDAP status: Due, Education has been provided regarding the importance of this vaccine. Advised may receive this vaccine at local pharmacy or Health Dept. Aware to provide a copy of the vaccination record if obtained from local pharmacy or Health Dept. Verbalized acceptance and  understanding.  Flu Vaccine status: Up to date  Pneumococcal vaccine status: Up to date  Covid-19 vaccine status: Information provided on how to obtain vaccines.   Qualifies for Shingles Vaccine? Yes   Zostavax completed No   Shingrix Completed?: No.    Education has been provided regarding the importance of this vaccine. Patient has been advised to call insurance company to determine out of pocket expense if they have not yet received this vaccine. Advised may also receive vaccine at local pharmacy or Health Dept. Verbalized acceptance and understanding.  Screening Tests Health Maintenance  Topic Date Due   Medicare Annual Wellness (AWV)  03/20/2024   Colonoscopy  07/04/2024   Pneumonia Vaccine 69+ Years old  Completed   INFLUENZA VACCINE  Completed   HPV VACCINES  Aged Out   DTaP/Tdap/Td  Discontinued   DEXA SCAN  Discontinued   COVID-19 Vaccine  Discontinued   Hepatitis C Screening  Discontinued   Zoster Vaccines- Shingrix  Discontinued    Health Maintenance  There are no preventive care reminders to display for this patient.   Colorectal cancer screening: Type of screening: Colonoscopy. Completed 2023. Repeat every 3 years  Mammogram status: Completed  . Repeat every year    Lung Cancer Screening: (Low Dose CT Chest recommended if Age 9-80 years, 20 pack-year currently smoking OR have quit w/in 15years.) does not qualify.   Lung Cancer Screening Referral:   Additional Screening:  Hepatitis C Screening qualify;   never done  Vision Screening: Recommended annual ophthalmology exams for early detection of glaucoma and other disorders of the eye. Is the patient up to date with their annual eye exam?  no Who is the provider or what is the name of the office in which the patient attends annual eye exams? [facility]    will call to schedule If pt is not established with a provider, would they like to be referred to a provider to establish care? No .   Dental  Screening: Recommended annual dental exams for proper oral hygiene    Community Resource Referral / Chronic Care Management: CRR required this visit?  No   CCM required this visit?  No     Plan:     I have personally reviewed and noted the following in the patient's chart:   Medical and social history Use of alcohol, tobacco or illicit drugs  Current medications and supplements including opioid prescriptions. Patient is not currently taking opioid prescriptions. Functional ability and status Nutritional status Physical activity Advanced directives List of other physicians Hospitalizations, surgeries, and ER visits in previous 12 months Vitals Screenings to include cognitive, depression, and falls Referrals and appointments  In addition, I have reviewed and discussed with patient certain preventive protocols, quality metrics, and best practice recommendations. A written personalized care plan for preventive services as well as general preventive health recommendations were provided to patient.     Remi Haggard, LPN   [id number]   After Visit Summary: (MyChart) Due to  this being a telephonic visit, the after visit summary with patients personalized plan was offered to patient via MyChart   Nurse Notes:

## 2023-03-21 NOTE — Patient Instructions (Signed)
[name] , Thank you for taking time to come for your Medicare Wellness Visit. I appreciate your ongoing commitment to your health goals. Please review the following plan we discussed and let me know if I can assist you in the future.   Screening recommendations/referrals: Colonoscopy: up to date Mammogram: up to date Bone Density: declined Recommended yearly ophthalmology/optometry visit for glaucoma screening and checkup Recommended yearly dental visit for hygiene and checkup  Vaccinations: Influenza vaccine: up to date Pneumococcal vaccine: up to date Tdap vaccine: Education provided Shingles vaccine: Education provided    Advanced directives: yes not on file    Preventive Care 76 Years and Older, Female Preventive care refers to lifestyle choices and visits with your health care provider that can promote health and wellness. What does preventive care include? A yearly physical exam. This is also called an annual well check. Dental exams once or twice a year. Routine eye exams. Ask your health care provider how often you should have your eyes checked. Personal lifestyle choices, including: Daily care of your teeth and gums. Regular physical activity. Eating a healthy diet. Avoiding tobacco and drug use. Limiting alcohol use. Practicing safe sex. Taking low-dose aspirin every day. Taking vitamin and mineral supplements as recommended by your health care provider. What happens during an annual well check? The services and screenings done by your health care provider during your annual well check will depend on your age, overall health, lifestyle risk factors, and family history of disease. Counseling  Your health care provider may ask you questions about your: Alcohol use. Tobacco use. Drug use. Emotional well-being. Home and relationship well-being. Sexual activity. Eating habits. History of falls. Memory and ability to understand (cognition). Work and work  Astronomer. Reproductive health. Screening  You may have the following tests or measurements: Height, weight, and BMI. Blood pressure. Lipid and cholesterol levels. These may be checked every 5 years, or more frequently if you are over 39 years old. Skin check. Lung cancer screening. You may have this screening every year starting at age 48 if you have a 30-pack-year history of smoking and currently smoke or have quit within the past 15 years. Fecal occult blood test (FOBT) of the stool. You may have this test every year starting at age 87. Flexible sigmoidoscopy or colonoscopy. You may have a sigmoidoscopy every 5 years or a colonoscopy every 10 years starting at age 9. Hepatitis C blood test. Hepatitis B blood test. Sexually transmitted disease (STD) testing. Diabetes screening. This is done by checking your blood sugar (glucose) after you have not eaten for a while (fasting). You may have this done every 1-3 years. Bone density scan. This is done to screen for osteoporosis. You may have this done starting at age 89. Mammogram. This may be done every 1-2 years. Talk to your health care provider about how often you should have regular mammograms. Talk with your health care provider about your test results, treatment options, and if necessary, the need for more tests. Vaccines  Your health care provider may recommend certain vaccines, such as: Influenza vaccine. This is recommended every year. Tetanus, diphtheria, and acellular pertussis (Tdap, Td) vaccine. You may need a Td booster every 10 years. Zoster vaccine. You may need this after age 39. Pneumococcal 13-valent conjugate (PCV13) vaccine. One dose is recommended after age 52. Pneumococcal polysaccharide (PPSV23) vaccine. One dose is recommended after age 34. Talk to your health care provider about which screenings and vaccines you need and how often you  need them. This information is not intended to replace advice given to you by  your health care provider. Make sure you discuss any questions you have with your health care provider. Document Released: 06/18/2015 Document Revised: 02/09/2016 Document Reviewed: 03/23/2015 Elsevier Interactive Patient Education  2017 [employer].  Fall Prevention in the Home Falls can cause injuries. They can happen to people of all ages. There are many things you can do to make your home safe and to help prevent falls. What can I do on the outside of my home? Regularly fix the edges of walkways and driveways and fix any cracks. Remove anything that might make you trip as you walk through a door, such as a raised step or threshold. Trim any bushes or trees on the path to your home. Use bright outdoor lighting. Clear any walking paths of anything that might make someone trip, such as rocks or tools. Regularly check to see if handrails are loose or broken. Make sure that both sides of any steps have handrails. Any raised decks and porches should have guardrails on the edges. Have any leaves, snow, or ice cleared regularly. Use sand or salt on walking paths during winter. Clean up any spills in your garage right away. This includes oil or grease spills. What can I do in the bathroom? Use night lights. Install grab bars by the toilet and in the tub and shower. Do not use towel bars as grab bars. Use non-skid mats or decals in the tub or shower. If you need to sit down in the shower, use a plastic, non-slip stool. Keep the floor dry. Clean up any water that spills on the floor as soon as it happens. Remove soap buildup in the tub or shower regularly. Attach bath mats securely with double-sided non-slip rug tape. Do not have throw rugs and other things on the floor that can make you trip. What can I do in the bedroom? Use night lights. Make sure that you have a light by your bed that is easy to reach. Do not use any sheets or blankets that are too big for your bed. They should not hang  down onto the floor. Have a firm chair that has side arms. You can use this for support while you get dressed. Do not have throw rugs and other things on the floor that can make you trip. What can I do in the kitchen? Clean up any spills right away. Avoid walking on wet floors. Keep items that you use a lot in easy-to-reach places. If you need to reach something above you, use a strong step stool that has a grab bar. Keep electrical cords out of the way. Do not use floor polish or wax that makes floors slippery. If you must use wax, use non-skid floor wax. Do not have throw rugs and other things on the floor that can make you trip. What can I do with my stairs? Do not leave any items on the stairs. Make sure that there are handrails on both sides of the stairs and use them. Fix handrails that are broken or loose. Make sure that handrails are as long as the stairways. Check any carpeting to make sure that it is firmly attached to the stairs. Fix any carpet that is loose or worn. Avoid having throw rugs at the top or bottom of the stairs. If you do have throw rugs, attach them to the floor with carpet tape. Make sure that you have a light switch  at the top of the stairs and the bottom of the stairs. If you do not have them, ask someone to add them for you. What else can I do to help prevent falls? Wear shoes that: Do not have high heels. Have rubber bottoms. Are comfortable and fit you well. Are closed at the toe. Do not wear sandals. If you use a stepladder: Make sure that it is fully opened. Do not climb a closed stepladder. Make sure that both sides of the stepladder are locked into place. Ask someone to hold it for you, if possible. Clearly mark and make sure that you can see: Any grab bars or handrails. First and last steps. Where the edge of each step is. Use tools that help you move around (mobility aids) if they are needed. These  include: Canes. Walkers. Scooters. Crutches. Turn on the lights when you go into a dark area. Replace any light bulbs as soon as they burn out. Set up your furniture so you have a clear path. Avoid moving your furniture around. If any of your floors are uneven, fix them. If there are any pets around you, be aware of where they are. Review your medicines with your doctor. Some medicines can make you feel dizzy. This can increase your chance of falling. Ask your doctor what other things that you can do to help prevent falls. This information is not intended to replace advice given to you by your health care provider. Make sure you discuss any questions you have with your health care provider. Document Released: 03/18/2009 Document Revised: 10/28/2015 Document Reviewed: 06/26/2014 Elsevier Interactive Patient Education  2017 [employer].

## 2023-03-29 ENCOUNTER — Telehealth: Payer: Self-pay | Admitting: Cardiovascular Disease

## 2023-03-29 NOTE — Telephone Encounter (Signed)
   Primary Cardiologist: Kristeen Miss, MD  Chart reviewed as part of pre-operative protocol coverage. Simple dental extractions are considered low risk procedures per guidelines and generally do not require any specific cardiac clearance. It is also generally accepted that for simple extractions and dental cleanings, there is no need to interrupt blood thinner therapy.   SBE prophylaxis is not required for the patient.  I will route this recommendation to the requesting party via Epic fax function and remove from pre-op pool.  Please call with questions.  Levi Aland, NP-C  03/29/2023, 10:31 AM [address], [address] Office [phone] Fax [phone]

## 2023-03-29 NOTE — Telephone Encounter (Signed)
   Pre-operative Risk Assessment    Patient Name: Shannon Cummings  DOB: April 19, 1946 MRN: [id number]      Request for Surgical Clearance    Procedure:   Cleaning , x-rays  Date of Surgery:  Clearance 03/29/23                                 Surgeon:  Dr. Damien Fusi Group or Practice Name:  [facility]  Phone number:  [phone] Fax number:  [phone]   Type of Clearance Requested:   - Medical    Type of Anesthesia:  None    Additional requests/questions:   In the chair right now   Signed, Emilie Rutter   03/29/2023, 10:19 AM

## 2023-04-06 ENCOUNTER — Other Ambulatory Visit: Payer: Self-pay | Admitting: Cardiovascular Disease

## 2023-04-06 ENCOUNTER — Other Ambulatory Visit: Payer: Self-pay

## 2023-04-06 DIAGNOSIS — I480 Paroxysmal atrial fibrillation: Secondary | ICD-10-CM

## 2023-04-06 DIAGNOSIS — I25119 Atherosclerotic heart disease of native coronary artery with unspecified angina pectoris: Secondary | ICD-10-CM

## 2023-04-06 DIAGNOSIS — E7800 Pure hypercholesterolemia, unspecified: Secondary | ICD-10-CM

## 2023-04-06 MED ORDER — APIXABAN 5 MG PO TABS
ORAL_TABLET | ORAL | 1 refills | Status: DC
Start: 2023-04-06 — End: 2023-11-09

## 2023-04-06 NOTE — Telephone Encounter (Signed)
Prescription refill request for Eliquis received. Indication: Afib  Last office visit: 02/01/23 ([facility])  Scr: 0.89 (01/23/23)  Age: 77 Weight: 61kg  Appropriate dose. Refill sent.

## 2023-04-22 ENCOUNTER — Encounter: Payer: Self-pay | Admitting: Cardiovascular Disease

## 2023-04-22 NOTE — Progress Notes (Unsigned)
Cardiology Office Note   Date:  04/23/2023   ID:  Shannon, Cummings 1946-04-20, MRN [id number]  PCP:  Sheliah Hatch, MD  Cardiologist:   Kristeen Miss, MD   Chief Complaint  Patient presents with   Coronary Artery Disease        Hyperlipidemia   Atrial Fibrillation        1. Coronary artery disease-status post stenting in the past.  She status post stenting of her proximal LAD  08/14/2012 2. Intermittent atrial fibrillation 3.  hyperlipidemia-she has been generally intolerant to most statins   Pt is doing well.  No cardiac complaints.     She complains of some tingling and numbness associated with the Crestor.  She has discontinued the Crestor because of that reason.  She has had reactions to most statins that we have tried.  October 08-2011  She has done well.  She has not had any angina or eposides of atrial fib.  She has retired and is traveling quite a bit.  Her husband has also retired.  She has not had any problems.  October 01, 2012:  She is having some bruising ( due to [place]).  She is enjoying the cardiac rehab.  She has noticed some low BP readings at the end of cardiac rehab.  She does not feel bad.  These low readings are typically asymptomatic.    No angina.  No arrhythmias.   August 20, 2013:  Takerra is doing ok.  She was admitted to the hospital with some CP recently.  Rule out for MI.    She has been doing Ok.  Has gained a bit of weight.   She is not able to exercise because  Of left ankle problems.    August 19, 2014:   Shannon Cummings is a 77 y.o. female who presents for follow up of her atrial fib. Has gained some weight.  She is in the study for lipid management Has had leg cramps since last fall. Off and on  Has had more paroxysmal atrial fib.  Lasts about a minute. Seem to be occuring more frequently.   Jan. 18, 2017:  Shannon Cummings presents for follow up of her CAD and atrial fib.  Doing well.   Has visited [place] recently .    No CP or dyspnea.   Tolerates the Rythmol.  Talked about her daughter who lives in [place].  Is not having any significant atrial fib issues.   Has been on lipid lowering study drug at [facility]  ( injectable cholesterol medication )   Jan.   25, 2018:  Doing well.   Feeling well Has had a stomach bug last week and now has a URI  - not the flu.   Has maintained NSR .  Is having hair loss.  Her research shows that amlodipine and atenolol can cause this   Aug. 23, 2018:  Shannon Cummings is seen today for eval of some elevated BP Has been having pain in her back, tightness in her chest  Went to ER.  Has had elevated BP for a week,   Went back to ER  Doubled her metoprolol XL .  Saw Tereso Newcomer on Aug. 6. 2018 Was given Imdur - developed a headache that lasted 3 days  Lexiscan myoview was low risk.   Her back pain / chest tightness has resolved.   May 08, 2017: Shannon Cummings seen today for follow-up of her hypertension, coronary artery disease, paroxysmal atrial fibrillation. Is  feeling well  Is taking the Toprol XL just once a day - not BID as listed in MAR .   August 15, 2017   Shannon Cummings is doing well BP is much better.  Is exercising and watching her diet.   Is on a study drug for cholesterol   June 18, 2018: He was admitted to the hospital recently with a non-ST segment elevation myocardial infarction. The previously placed LAD stent was widely patent.  She had a tight stenosis in her first obtuse marginal artery that was stented. Function is mildly reduced with an EF of 45 to 50%.  She returned to the ER the day after she went home with an episode of rapid AF and CP .    Troponin was  0.18  Her rhythmol has been stopped . Saw [name] and was started on Amiodarone . The plan is for short term Amio and then consider AF ablation . They also discussed Tikosyn .  She tolerates her AF poorly.  We discussed taking short acting beta blockers if she has AF with CP  Had  been walking ,   Has been weak since the MI.    Feb. 26, 2020  Shannon Cummings is seen for follow up of her CAD and PAF Has had some shortness of breath . BP is a bit elevated today  amio has been reduced to 200 mg a day  Has an appt with Dr. Johney Frame on March 23 to discuss ablation  Is in a lipid study   Nov. 22, 2021: Shannon Cummings is seen for follow up of her CAD, PAF . She had a NSTEMI in Dec.   Propafaone was stopped after that hospitalization.  Was started on Amiodarone  Doing well from a cardiac standpoint.  Is on lots abx.  Has not been exercising .    Will check lipids, liver enz, bmp today  Is being treated for C.diff colitis   Jan. 9, 2023 Shannon Cummings is seen for follow up of her CAD, PAF She had previously been on profafanone until a NSTEMI several years ago.   Was started on amio.   We referred her for AF ablation given the side effects of amio.   Was seen by [facility].  She has had an atrial fib ablation since I last saw her. Continues with Eliquis.   No cp, Not as much exercise as she should be getting  Has lost some weight  - wt is 133 lbs    Feb. 28, 2024 Shannon Cummings is seen for follow up of her CAD, PAF.  S/p afib ablation She was seen in the hospital on June 02, 2022 with episodes of chest pain.  Felt like she was going to pass out .  She had taken 2 nitroglycerin without any relief. Troponins x 2 were normal (3, 4).  No further episodes of CP .   She is going back to [place] this spring .  Going to [place]  She has been on [employer] and bempedoic acid.  Despite our best efforts her LDL is still 96.  She started about 200.   Feb 01, 2023  Shannon Cummings is seen for follow up of her CAD, PAF, afib ablation Her husband had a stroke at the end of July, Had carotid surgery   She had angina , was admitted to [facility]  Had a very tough time while in the ER ,  Is very dissatisfied with her care  - trouble with her nurse call button.  She called  the operator from her ER  room Called a  total of 5 times to the operator   Troponins were 1995, 1771  Cath showed her CAD as stable.  Troponin elevation was thought to be due to spasm  Mild anterolateral hypokinesis  Echo shows normal LV EF  60-65%  RV function is mildly reduced  Has not taken her Losartan in weeks .  Has been DC'd.  Was started on metoprolol following hospitalization   No further episodes of CP  Is having orthostatic hypotension   Nov. 18, 2024 Shannon Cummings is seen for follow up of her CAD Had a NSTEMI in July  Cath revealed stable,  CP/ troponin elevation was thought to be due to spasm   Still upset about her care during a hospitalization in June  BP has been variable  - up and down  Has been taking Losartan 25-50 mg if her BP runs high Avoids salt for the most part  Only moderate ETOH intake  We discussed my retirement in June. I will have her follow up with Dr. Jacques Navy      Past Medical History:  Diagnosis Date   Arthritis    CAD (coronary artery disease)    a. prior stenting history. b. NSTEMI s/p DES to prox LAD with EF 45% by cath 08/14/12. c. Mild trop elevation shortly after cath ?mild plaque embolization - patent stent on relook. // d. Myoview 8/18: EF 60, normal perfusion; Low Risk. e. NSTEMI 06/2018- patent prior LAD stent, 80% OM1 s/p DES, normal LVEDP, EF 45-50%, moderate residual disease treated medically.   Fatty infiltration of liver    GERD (gastroesophageal reflux disease)    Hyperlipidemia    Hypertension    Hypokalemia    Hypotension    Myocardial infarction (HCC) 2014   OSA on CPAP    Osteoporosis    PAF (paroxysmal atrial fibrillation) (HCC)    On rythmol previously   Pulmonary nodule    a. 8mm subpleural nodular density CT 08/2012, instructed to f/u pulm MD.   Sinus bradycardia    Tubular adenoma of colon 2007    Past Surgical History:  Procedure Laterality Date   ATRIAL FIBRILLATION ABLATION N/A 07/27/2020   Procedure: ATRIAL FIBRILLATION ABLATION;  Surgeon:  Hillis Range, MD;  Location: [facility] INVASIVE CV LAB;  Service: Cardiovascular;  Laterality: N/A;   COLONOSCOPY  03/07/2016   COLONOSCOPY WITH PROPOFOL  07/04/2021   CORONARY ANGIOPLASTY WITH STENT PLACEMENT  08/14/2012   LAD  Dr Tonny Bollman   CORONARY STENT INTERVENTION N/A 06/14/2018   Procedure: CORONARY STENT INTERVENTION;  Surgeon: Corky Crafts, MD;  Location: [facility] INVASIVE CV LAB;  Service: Cardiovascular;  Laterality: N/A;   CORONARY STENT PLACEMENT  2000   hair restoration  02/2017   LEFT HEART CATH AND CORONARY ANGIOGRAPHY N/A 06/14/2018   Procedure: LEFT HEART CATH AND CORONARY ANGIOGRAPHY;  Surgeon: Corky Crafts, MD;  Location: [facility] INVASIVE CV LAB;  Service: Cardiovascular;  Laterality: N/A;   LEFT HEART CATH AND CORONARY ANGIOGRAPHY N/A 05/14/2019   Procedure: LEFT HEART CATH AND CORONARY ANGIOGRAPHY;  Surgeon: Tonny Bollman, MD;  Location: [facility] INVASIVE CV LAB;  Service: Cardiovascular;  Laterality: N/A;   LEFT HEART CATH AND CORONARY ANGIOGRAPHY N/A 01/23/2023   Procedure: LEFT HEART CATH AND CORONARY ANGIOGRAPHY;  Surgeon: Corky Crafts, MD;  Location: [facility] INVASIVE CV LAB;  Service: Cardiovascular;  Laterality: N/A;   LEFT HEART CATHETERIZATION WITH CORONARY ANGIOGRAM N/A 08/14/2012   Procedure: LEFT HEART CATHETERIZATION  WITH CORONARY ANGIOGRAM;  Surgeon: Tonny Bollman, MD;  Location: [facility] CATH LAB;  Service: Cardiovascular;  Laterality: N/A;   LEFT HEART CATHETERIZATION WITH CORONARY ANGIOGRAM N/A 08/19/2012   Procedure: LEFT HEART CATHETERIZATION WITH CORONARY ANGIOGRAM;  Surgeon: Tonny Bollman, MD;  Location: [facility] CATH LAB;  Service: Cardiovascular;  Laterality: N/A;   ROTATOR CUFF REPAIR     TONSILLECTOMY     TOTAL ABDOMINAL HYSTERECTOMY       Current Outpatient Medications  Medication Sig Dispense Refill   acetaminophen (TYLENOL) 500 MG tablet Take 500 mg by mouth daily as needed for moderate pain, fever or headache.     Alirocumab (PRALUENT) 75 MG/ML  SOAJ ADMINISTER 1 ML UNDER THE SKIN EVERY 14 DAYS 6 mL 1   apixaban (ELIQUIS) 5 MG TABS tablet TAKE 1 TABLET(5 MG) BY MOUTH TWICE DAILY 180 tablet 1   Bempedoic Acid (NEXLETOL) 180 MG TABS TAKE 1 TABLET BY MOUTH DAILY 90 tablet 3   Cholecalciferol (VITAMIN D3) 50 MCG (2000 UT) capsule Take 1 capsule (2,000 Units total) by mouth daily.     clopidogrel (PLAVIX) 75 MG tablet Take 2 tablets (150 mg total) by mouth on day one only, then decrease to taking 1 tablet (75 mg total) by mouth daily thereafter. 92 tablet 1   doxycycline (MONODOX) 50 MG capsule Take 50 mg by mouth every Monday, Wednesday, and Friday.     fluticasone (FLONASE) 50 MCG/ACT nasal spray Place 2 sprays into both nostrils daily as needed for allergies or rhinitis. 48 g 1   losartan (COZAAR) 25 MG tablet Take 1 tablet (25 mg total) by mouth daily. 30 tablet 0   metoprolol succinate (TOPROL-XL) 25 MG 24 hr tablet Take 0.5 tablets (12.5 mg total) by mouth at bedtime. 30 tablet 4   Multiple Vitamins-Minerals (MULTIVITAMIN WOMEN 50+) TABS Take 1 tablet by mouth daily.     nitroGLYCERIN (NITROSTAT) 0.4 MG SL tablet Place 1 tablet (0.4 mg total) under the tongue every 5 (five) minutes as needed for chest pain. 25 tablet 3   potassium chloride (KLOR-CON) 10 MEQ tablet TAKE 1 TABLET(10 MEQ) BY MOUTH DAILY 90 tablet 3   Propylene Glycol (SYSTANE COMPLETE) 0.6 % SOLN Place 1 drop into both eyes 3 (three) times daily.     No current facility-administered medications for this visit.    Allergies:   Effexor [venlafaxine], Flomax [tamsulosin], Macrobid [nitrofurantoin], Other, B-12 compliance injection [cyanocobalamin], Imdur [isosorbide nitrate], Statins, and Zetia [ezetimibe]    Social History:  The patient  reports that she has never smoked. She has never used smokeless tobacco. She reports current alcohol use of about 1.0 standard drink of alcohol per week. She reports that she does not use drugs.   Family History:  The patient's family  history includes Alzheimer's disease in her mother; Breast cancer in her maternal aunt; Heart disease in her father.    ROS: Noted in current history.  All other systems are negative.    Physical Exam: Blood pressure 130/78, pulse 61, height 5' (1.524 m), weight 133 lb 9.6 oz (60.6 kg), SpO2 96%.       GEN:  Well nourished, well developed in no acute distress HEENT: Normal NECK: No JVD; No carotid bruits LYMPHATICS: No lymphadenopathy CARDIAC: RRR , no murmurs, rubs, gallops RESPIRATORY:  Clear to auscultation without rales, wheezing or rhonchi  ABDOMEN: Soft, non-tender, non-distended MUSCULOSKELETAL:  No edema; No deformity  SKIN: Warm and dry NEUROLOGIC:  Alert and oriented x 3  EKG:   .       Recent Labs: 01/22/2023: ALT 25; Magnesium 1.8 01/23/2023: BUN 19; Creatinine, Ser 0.89; Potassium 4.1; Sodium 131 02/16/2023: TSH 3.88 03/06/2023: Hemoglobin 13.4; Platelets 245    Lipid Panel    Component Value Date/Time   CHOL 150 01/23/2023 0431   CHOL 176 08/02/2022 1013   TRIG 77 01/23/2023 0431   HDL 52 01/23/2023 0431   HDL 49 08/02/2022 1013   CHOLHDL 2.9 01/23/2023 0431   VLDL 15 01/23/2023 0431   LDLCALC 83 01/23/2023 0431   LDLCALC 94 08/02/2022 1013   LDLDIRECT 111.0 04/28/2019 1138      Wt Readings from Last 3 Encounters:  04/23/23 133 lb 9.6 oz (60.6 kg)  03/02/23 134 lb 6.4 oz (61 kg)  02/20/23 135 lb (61.2 kg)      Other studies Reviewed: Additional studies/ records that were reviewed today include: . Review of the above records demonstrates:    ASSESSMENT AND PLAN:  1. Coronary artery disease-overall she seems to be very stable.  She is not having any further episodes of chest discomfort.    2. Intermittent atrial fibrillation -continue Eliquis.      3.  hyperlipidemia-she remains on Praluent and bempedoic acid.   Continue current medications.     4. Essential HTN:    She continues to have intermittent episodes of elevated  blood pressure.  She was having orthostatic hypotension at her last visit.  I encouraged her to restart her exercise program.  Overall she is fairly careful with her salt although she has salt on rare occasion.  Will allow her to take losartan 25 mg a day on an as-needed basis if her blood pressure levels get too high.   Current medicines are reviewed at length with the patient today.  The patient does not have concerns regarding medicines.  The following changes have been made:  no change  Labs/ tests ordered today include:   No orders of the defined types were placed in this encounter.     Signed, Kristeen Miss, MD  04/23/2023 9:17 AM    [facility] [address] [place], [place], [place]  [address] Phone: [phone]; Fax: [phone]

## 2023-04-23 ENCOUNTER — Ambulatory Visit: Payer: Medicare Other | Attending: Cardiovascular Disease | Admitting: Cardiovascular Disease

## 2023-04-23 ENCOUNTER — Other Ambulatory Visit: Payer: Self-pay

## 2023-04-23 ENCOUNTER — Encounter: Payer: Self-pay | Admitting: Cardiovascular Disease

## 2023-04-23 VITALS — BP 130/78 | HR 61 | Ht 60.0 in | Wt 133.6 lb

## 2023-04-23 DIAGNOSIS — I10 Essential (primary) hypertension: Secondary | ICD-10-CM | POA: Diagnosis not present

## 2023-04-23 MED ORDER — LOSARTAN POTASSIUM 25 MG PO TABS: 25.0000 mg | ORAL_TABLET | Freq: Every day | ORAL | 3 refills | Status: DC

## 2023-04-23 MED ORDER — LOSARTAN POTASSIUM 25 MG PO TABS: 25.0000 mg | ORAL_TABLET | Freq: Every day | ORAL | 0 refills | Status: DC

## 2023-04-23 NOTE — Patient Instructions (Signed)
Medication Instructions:  START Losartan 25mg  daily AS NEEDED *If you need a refill on your cardiac medications before your next appointment, please call your pharmacy*  Follow-Up: At [facility], you and your health needs are our priority.  As part of our continuing mission to provide you with exceptional heart care, we have created designated Provider Care Teams.  These Care Teams include your primary Cardiologist (physician) and Advanced Practice Providers (APPs -  Physician Assistants and Nurse Practitioners) who all work together to provide you with the care you need, when you need it.  Your next appointment:   3 month(s)  Provider:   Kristeen Miss, MD

## 2023-04-30 ENCOUNTER — Emergency Department (HOSPITAL_COMMUNITY): Payer: Medicare Other

## 2023-04-30 ENCOUNTER — Encounter (HOSPITAL_COMMUNITY): Payer: Self-pay

## 2023-04-30 ENCOUNTER — Other Ambulatory Visit: Payer: Self-pay

## 2023-04-30 ENCOUNTER — Emergency Department (HOSPITAL_COMMUNITY)
Admission: EM | Admit: 2023-04-30 | Discharge: 2023-05-01 | Disposition: A | Discharge status: Home / Self Care | Payer: Medicare Other | Attending: Emergency Medicine | Admitting: Emergency Medicine

## 2023-04-30 DIAGNOSIS — R0789 Other chest pain: Secondary | ICD-10-CM

## 2023-04-30 DIAGNOSIS — I480 Paroxysmal atrial fibrillation: Secondary | ICD-10-CM | POA: Diagnosis not present

## 2023-04-30 DIAGNOSIS — I2510 Atherosclerotic heart disease of native coronary artery without angina pectoris: Secondary | ICD-10-CM | POA: Diagnosis not present

## 2023-04-30 DIAGNOSIS — Z7901 Long term (current) use of anticoagulants: Secondary | ICD-10-CM | POA: Insufficient documentation

## 2023-04-30 DIAGNOSIS — I10 Essential (primary) hypertension: Secondary | ICD-10-CM | POA: Insufficient documentation

## 2023-04-30 DIAGNOSIS — R079 Chest pain, unspecified: Secondary | ICD-10-CM | POA: Diagnosis not present

## 2023-04-30 DIAGNOSIS — R030 Elevated blood-pressure reading, without diagnosis of hypertension: Secondary | ICD-10-CM

## 2023-04-30 LAB — BASIC METABOLIC PANEL
Anion gap: 11 (ref 5–15)
BUN: 13 mg/dL (ref 8–23)
CO2: 22 mmol/L (ref 22–32)
Calcium: 9.4 mg/dL (ref 8.9–10.3)
Chloride: 101 mmol/L (ref 98–111)
Creatinine, Ser: 0.91 mg/dL (ref 0.44–1.00)
GFR, Estimated: >60 mL/min (ref >60)
Glucose, Bld: 112 mg/dL — ABNORMAL HIGH (ref 70–99)
Potassium: 3.6 mmol/L (ref 3.5–5.1)
Sodium: 134 mmol/L — ABNORMAL LOW (ref 135–145)

## 2023-04-30 LAB — CBC
HCT: 40.4 % (ref 36.0–46.0)
Hemoglobin: 13.3 g/dL (ref 12.0–15.0)
MCH: 29 pg (ref 26.0–34.0)
MCHC: 32.9 g/dL (ref 30.0–36.0)
MCV: 88.2 fL (ref 80.0–100.0)
Platelets: 213 10*3/uL (ref 150–400)
RBC: 4.58 MIL/uL (ref 3.87–5.11)
RDW: 13.2 % (ref 11.5–15.5)
WBC: 6 10*3/uL (ref 4.0–10.5)
nRBC: 0 % (ref 0.0–0.2)

## 2023-04-30 LAB — TROPONIN I (HIGH SENSITIVITY)
Troponin I (High Sensitivity): 8 ng/L (ref <18)
Troponin I (High Sensitivity): 10 ng/L (ref <18)

## 2023-04-30 NOTE — ED Triage Notes (Addendum)
Pt BIB [facility] from home. Pt has been having chest tightness, non radiating.This episode started about 45 min to 1 hour ago. Initial BP 200/100. Pt took 2 Losartan today at 0830 and another prior to EMS arrival. Pt also took 1 nitroglycerin prior to EMS arrival. EMS gave 2 nitroglycerin . Pt is on blood thinners, eliquis & plavix. Hx of 3 heart attacks   EMS 152/90 BP 99% O2 22R 18 LAC

## 2023-04-30 NOTE — Discharge Instructions (Signed)
Please tachycardia allergist to follow-up on your blood pressure, return to the emergency department if you have new or worsening chest pain, shortness of breath, or your blood pressure continues to be severely elevated with no improvement several hours after taking your blood pressure medication, especially if you are developing headache, chest pain, shortness of breath.

## 2023-04-30 NOTE — ED Provider Notes (Signed)
[facility] EMERGENCY DEPARTMENT AT [facility] Provider Note   CSN: [id number] Arrival date & time: 04/30/23  2029     History  Chief Complaint  Patient presents with   Hypertension   Palpitations    Shannon Cummings is a 77 y.o. female with past medical history seen for hypertension, hyperlipidemia, paroxysmal A-fib, CAD, she had an NSTEMI in August or September of this year she reports, but did not have to have any stenting at that time, she is on Eliquis, Plavix.  Patient reports that today she has been having chest tightness which is nonradiating, starting around 45 minutes to 1 hour ago, felt like heart was racing.  She reports her blood pressure has been elevated today.  She took 2 losartan today at 8:30 AM because of blood pressure systolic greater than 160, and then blood pressure was 199/110 at home and so she took 1 additional losartan.  She also took 1 nitroglycerin prior to EMS arrival.  EMS gave another 2 nitroglycerin.  She reports some shortness of breath at this time but denies any chest pain, nausea, vomiting, radiation to the left arm.   Hypertension  Palpitations      Home Medications Prior to Admission medications   Medication Sig Start Date End Date Taking? Authorizing Provider  acetaminophen (TYLENOL) 500 MG tablet Take 500 mg by mouth daily as needed for moderate pain, fever or headache.    [provider]  Alirocumab (PRALUENT) 75 MG/ML SOAJ ADMINISTER 1 ML UNDER THE SKIN EVERY 14 DAYS 04/06/23   Nahser, Deloris Ping, MD  apixaban (ELIQUIS) 5 MG TABS tablet TAKE 1 TABLET(5 MG) BY MOUTH TWICE DAILY 04/06/23   Nahser, Deloris Ping, MD  Bempedoic Acid (NEXLETOL) 180 MG TABS TAKE 1 TABLET BY MOUTH DAILY 08/09/22   Nahser, Deloris Ping, MD  Cholecalciferol (VITAMIN D3) 50 MCG (2000 UT) capsule Take 1 capsule (2,000 Units total) by mouth daily. 01/24/23   Abagail Kitchens, PA-C  clopidogrel (PLAVIX) 75 MG tablet Take 2 tablets (150 mg total) by mouth on day one  only, then decrease to taking 1 tablet (75 mg total) by mouth daily thereafter. 02/01/23   Nahser, Deloris Ping, MD  doxycycline (MONODOX) 50 MG capsule Take 50 mg by mouth every Monday, Wednesday, and Friday.    [provider]  fluticasone (FLONASE) 50 MCG/ACT nasal spray Place 2 sprays into both nostrils daily as needed for allergies or rhinitis. 11/23/22   Alveria Apley, NP  losartan (COZAAR) 25 MG tablet Take 1 tablet (25 mg total) by mouth daily. 04/23/23   Nahser, Deloris Ping, MD  metoprolol succinate (TOPROL-XL) 25 MG 24 hr tablet Take 0.5 tablets (12.5 mg total) by mouth at bedtime. 01/24/23   Abagail Kitchens, PA-C  Multiple Vitamins-Minerals (MULTIVITAMIN WOMEN 50+) TABS Take 1 tablet by mouth daily.    [provider]  nitroGLYCERIN (NITROSTAT) 0.4 MG SL tablet Place 1 tablet (0.4 mg total) under the tongue every 5 (five) minutes as needed for chest pain. 05/22/22   Nahser, Deloris Ping, MD  potassium chloride (KLOR-CON) 10 MEQ tablet TAKE 1 TABLET(10 MEQ) BY MOUTH DAILY 08/15/22   Nahser, Deloris Ping, MD  Propylene Glycol (SYSTANE COMPLETE) 0.6 % SOLN Place 1 drop into both eyes 3 (three) times daily.    [provider]      Allergies    Effexor [venlafaxine], Flomax [tamsulosin], Macrobid [nitrofurantoin], Other, B-12 compliance injection [cyanocobalamin], Imdur [isosorbide nitrate], Statins, and Zetia [ezetimibe]  Review of Systems   Review of Systems  Cardiovascular:  Positive for palpitations.  All other systems reviewed and are negative.   Physical Exam Updated Vital Signs BP (!) 140/78   Pulse 65   Temp (!) 97.5 F (36.4 C) (Oral)   Resp 13   Ht 5\' 1"  (1.549 m)   Wt 59 kg   SpO2 97%   BMI 24.56 kg/m  Physical Exam Vitals and nursing note reviewed.  Constitutional:      General: She is not in acute distress.    Appearance: Normal appearance.  HENT:     Head: Normocephalic and atraumatic.  Eyes:     General:        Right eye: No discharge.         Left eye: No discharge.  Cardiovascular:     Rate and Rhythm: Normal rate and regular rhythm.     Heart sounds: No murmur heard.    No friction rub. No gallop.  Pulmonary:     Effort: Pulmonary effort is normal.     Breath sounds: Normal breath sounds.  Abdominal:     General: Bowel sounds are normal.     Palpations: Abdomen is soft.  Skin:    General: Skin is warm and dry.     Capillary Refill: Capillary refill takes less than 2 seconds.  Neurological:     Mental Status: She is alert and oriented to person, place, and time.  Psychiatric:        Mood and Affect: Mood normal.        Behavior: Behavior normal.     ED Results / Procedures / Treatments   Labs (all labs ordered are listed, but only abnormal results are displayed) Labs Reviewed  BASIC METABOLIC PANEL - Abnormal; Notable for the following components:      Result Value   Sodium 134 (*)    Glucose, Bld 112 (*)    All other components within normal limits  CBC  TROPONIN I (HIGH SENSITIVITY)  TROPONIN I (HIGH SENSITIVITY)    EKG None  Radiology DG Chest 2 View  Result Date: 04/30/2023 CLINICAL DATA:  Chest pain tightness EXAM: CHEST - 2 VIEW COMPARISON:  01/22/2023 FINDINGS: The heart size and mediastinal contours are within normal limits. Both lungs are clear. The visualized skeletal structures are unremarkable. IMPRESSION: No active cardiopulmonary disease. Electronically Signed   By: Jasmine Pang M.D.   On: 04/30/2023 22:25    Procedures Procedures    Medications Ordered in ED Medications - No data to display  ED Course/ Medical Decision Making/ A&P                                 Medical Decision Making  This patient is a 77 y.o. female  who presents to the ED for concern of chest pain.   Differential diagnoses prior to evaluation: The emergent differential diagnosis includes, but is not limited to,  ACS, AAS, PE, Mallory-Weiss, Boerhaave's, Pneumonia, acute bronchitis, asthma or COPD  exacerbation, anxiety, MSK pain or traumatic injury to the chest, acid reflux versus other . This is not an exhaustive differential.   Past Medical History / Co-morbidities / Social History: Hyperlipidemia, hypertension, CAD, previous MI, previous NSTEMI, paroxysmal A-fib, GERD  Additional history: Chart reviewed. Pertinent results include: Extensively reviewed recent cardiac evaluation, including left heart cath and echo in August at the time of her most recent NSTEMI  Physical Exam: Physical exam performed. The pertinent findings include: No wheezing, rhonchi, stridor, rales, initially somewhat hypertensive, blood pressure 163/89, significantly improved on recheck, blood pressure 140/78 most recently, at 2241.  Other vital signs stable, afebrile, without tachycardic, and 100% oxygen saturation on room air.  Lab Tests/Imaging studies: I personally interpreted labs/imaging and the pertinent results include: CBC unremarkable, BMP with very mild hyponatremia, sodium 134.  Initial troponin 8 with delta of 10.  Plain film chest x-ray shows no evidence of acute intrathoracic abnormality, independently interpreted these images myself. I agree with the radiologist interpretation.  Cardiac monitoring: EKG obtained and interpreted by myself and attending physician which shows: Normal sinus rhythm, no significant change from baseline, no acute ST-T changes   Disposition: After consideration of the diagnostic results and the patients response to treatment, I feel that considered cardiac consultation, admission given her extensive history of ACS, CAD, however her presentation today was atypical, and per patient quite unlike her previous heart attacks or NSTEMI's, she is chest pain-free at this time, her workup is quite reassuring, and patient feels comfortable with plan for discharge..   emergency department workup does not suggest an emergent condition requiring admission or immediate intervention beyond  what has been performed at this time. The plan is: as above. The patient is safe for discharge and has been instructed to return immediately for worsening symptoms, change in symptoms or any other concerns.  Final Clinical Impression(s) / ED Diagnoses Final diagnoses:  Elevated blood pressure reading  Chest tightness    Rx / DC Orders ED Discharge Orders     None         Olene Floss, PA-C 04/30/23 2359    Rozelle Logan, DO 05/07/23 1657

## 2023-05-02 ENCOUNTER — Encounter: Payer: Self-pay | Admitting: Cardiovascular Disease

## 2023-05-23 ENCOUNTER — Other Ambulatory Visit: Payer: Self-pay

## 2023-05-23 ENCOUNTER — Ambulatory Visit: Payer: Self-pay | Admitting: Family Medicine

## 2023-05-23 ENCOUNTER — Emergency Department (HOSPITAL_BASED_OUTPATIENT_CLINIC_OR_DEPARTMENT_OTHER)
Admission: EM | Admit: 2023-05-23 | Discharge: 2023-05-23 | Disposition: A | Discharge status: Home / Self Care | Payer: Medicare Other | Attending: Emergency Medicine | Admitting: Emergency Medicine

## 2023-05-23 ENCOUNTER — Emergency Department (HOSPITAL_BASED_OUTPATIENT_CLINIC_OR_DEPARTMENT_OTHER): Payer: Medicare Other | Admitting: Radiology

## 2023-05-23 DIAGNOSIS — R0789 Other chest pain: Secondary | ICD-10-CM | POA: Diagnosis present

## 2023-05-23 DIAGNOSIS — R079 Chest pain, unspecified: Secondary | ICD-10-CM | POA: Diagnosis not present

## 2023-05-23 DIAGNOSIS — Z7901 Long term (current) use of anticoagulants: Secondary | ICD-10-CM | POA: Diagnosis not present

## 2023-05-23 DIAGNOSIS — I2510 Atherosclerotic heart disease of native coronary artery without angina pectoris: Secondary | ICD-10-CM | POA: Diagnosis not present

## 2023-05-23 DIAGNOSIS — Z79899 Other long term (current) drug therapy: Secondary | ICD-10-CM | POA: Insufficient documentation

## 2023-05-23 DIAGNOSIS — R0602 Shortness of breath: Secondary | ICD-10-CM | POA: Diagnosis not present

## 2023-05-23 DIAGNOSIS — I10 Essential (primary) hypertension: Secondary | ICD-10-CM | POA: Diagnosis not present

## 2023-05-23 DIAGNOSIS — Z955 Presence of coronary angioplasty implant and graft: Secondary | ICD-10-CM | POA: Diagnosis not present

## 2023-05-23 LAB — CBC
HCT: 40.7 % (ref 36.0–46.0)
Hemoglobin: 13.6 g/dL (ref 12.0–15.0)
MCH: 29.4 pg (ref 26.0–34.0)
MCHC: 33.4 g/dL (ref 30.0–36.0)
MCV: 88.1 fL (ref 80.0–100.0)
Platelets: 219 10*3/uL (ref 150–400)
RBC: 4.62 MIL/uL (ref 3.87–5.11)
RDW: 13.5 % (ref 11.5–15.5)
WBC: 7 10*3/uL (ref 4.0–10.5)
nRBC: 0 % (ref 0.0–0.2)

## 2023-05-23 LAB — TROPONIN I (HIGH SENSITIVITY): Troponin I (High Sensitivity): 6 ng/L (ref <18)

## 2023-05-23 LAB — BASIC METABOLIC PANEL
Anion gap: 11 (ref 5–15)
BUN: 16 mg/dL (ref 8–23)
CO2: 24 mmol/L (ref 22–32)
Calcium: 9.7 mg/dL (ref 8.9–10.3)
Chloride: 100 mmol/L (ref 98–111)
Creatinine, Ser: 0.87 mg/dL (ref 0.44–1.00)
GFR, Estimated: >60 mL/min (ref >60)
Glucose, Bld: 89 mg/dL (ref 70–99)
Potassium: 3.4 mmol/L — ABNORMAL LOW (ref 3.5–5.1)
Sodium: 135 mmol/L (ref 135–145)

## 2023-05-23 MED ORDER — LORAZEPAM 1 MG PO TABS
0.5000 mg | ORAL_TABLET | Freq: Once | ORAL | Status: AC
  Administered 2023-05-23: 0.5 mg via ORAL
  Filled 2023-05-23: qty 1

## 2023-05-23 NOTE — Telephone Encounter (Addendum)
  Chief Complaint: Chest and Shoulder Pain Disposition: [x] ED /[] Urgent Care (no appt availability in office) / [] Appointment(In office/virtual)/ []  [facility]/ [] Home Care/ [] Refused Recommended Disposition /[] [place] [facility]/ []  Follow-up with PCP Additional Notes: Shannon Cummings is a 77 year old female being screened for previously reported chest pain and shoulder pain.   Call placed to patient at 14:12, no answer left voicemail. Callback queued.   Call placed to Emergency Contact at 14:18, no answer, left voicemail. Callback queued.   1. ONSET: "When did the pain start?"     Two weeks ago.  2. LOCATION: "Where is the pain located?"     Right, Medial Shoulder blade, radiates to the chest, centered to the right side.  3. PAIN: "How bad is the pain?" (Scale 1-10; or mild, moderate, severe)   - MILD (1-3): doesn't interfere with normal activities   - MODERATE (4-7): interferes with normal activities (e.g., work or school) or awakens from sleep   - SEVERE (8-10): excruciating pain, unable to do any normal activities, unable to move arm at all due to pain     5 4. WORK OR EXERCISE: "Has there been any recent work or exercise that involved this part of the body?"     No 5. CAUSE: "What do you think is causing the shoulder pain?"     Stress, Anxiety 6. OTHER SYMPTOMS: "Do you have any other symptoms?" (e.g., neck pain, swelling, rash, fever, numbness, weakness)     Dizziness 7. PREGNANCY: "Is there any chance you are pregnant?" "When was your last menstrual period?"     NO  Chief Complaint: Chest and Shoulder Pain Symptoms: Dizziness Frequency: Two Weeks Pertinent Negatives: Patient denies Shortness of Breath, Diaphoresis, Dyspnea, or Severe Chest Pain Disposition: [x] ED /[] Urgent Care (no appt availability in office) / [] Appointment(In office/virtual)/ []  [facility]/ [] Home Care/ [] Refused Recommended Disposition /[] [place] [facility]/ []  Follow-up with  PCP Additional Notes:  Shannon Cummings is a 77 year old female triaged for right sided shoulder pain that radiates to her chest on the right side. The patient also reports having chest pressure as well. Reports overall pain as a 5 on a numeric pain scale. Additionally reported symptoms include dizziness and anxiety. The patient reports her history has placed her under great amounts of stress lately, and she suspects this to be the cause of her presenting symptoms. Advised the patient to see a provider within the next 4 hours; however, the patient chose to not see a provider and states she will keep her appointment with Dr. Beverely Low tomorrow, and go to her nearest ER if symptoms worsened. Provided extensive education regarding possible outcomes and warning symptoms. Verbalized understanding.

## 2023-05-23 NOTE — Discharge Instructions (Signed)
Please follow-up with your primary care provider tomorrow along with your cardiologist in regards to recent ER visit.  Today your labs and imaging were all reassuring and it appears there are multiple factors affecting her blood pressure.  Please do the cardiologist about increasing your losartan as this can go up in dosage.  We will speak to your primary care provider about possible anxiety meds you are given 0.5 mg of Ativan today.  If Symptoms change or worsen please return to ER.  Please continue taking your medications as prescribed.

## 2023-05-23 NOTE — Telephone Encounter (Signed)
fyi

## 2023-05-23 NOTE — ED Provider Notes (Signed)
[place] EMERGENCY DEPARTMENT AT [facility] Provider Note   CSN: [id number] Arrival date & time: 05/23/23  1558     History  Chief Complaint  Patient presents with   Chest Pain    MYKIA VANDENBOSCH is a 77 y.o. female history of MI, NSTEMI, CAD, A-fib on Eliquis and Plavix, PVCs presented for chest pain has been present for the past 2 weeks.  Patient notes her blood pressure has been in the 200s as well and thinks all this is related to stress and anxiety.  Patient says she is under a lot of stress as she keeps retaking her blood pressure and keeps going higher and higher.  Patient is currently on 50 mg of losartan that she takes daily.  Patient states that there is a discomfort in her chest that is widespread but denies shortness of breath, left arm pain, left jaw pain, abdominal pain, nauseous vomiting, neck pain, back pain, shortness of breath, vision change, headache.  Patient denies paresthesias, gait abnormalities, weakness.  Patient states this feels different than her heart attacks and thinks this is a panic attack as she is not on any anxiety medications.  Cardiologist: Elease Hashimoto, MD  Last cath: 01/23/2023: possible vasospasm for chest pain, LVEF 55-65%, Ost 1st Diag lesion is 70% stenosed.  Similar to prior.  Ost 1st Mrg lesion is 25% stenosed. RPDA lesion is 50% stenosed. Dist Cx lesion is 50% stenosed.  Home Medications Prior to Admission medications   Medication Sig Start Date End Date Taking? Authorizing Provider  acetaminophen (TYLENOL) 500 MG tablet Take 500 mg by mouth daily as needed for moderate pain, fever or headache.    [provider]  Alirocumab (PRALUENT) 75 MG/ML SOAJ ADMINISTER 1 ML UNDER THE SKIN EVERY 14 DAYS 04/06/23   Nahser, Deloris Ping, MD  apixaban (ELIQUIS) 5 MG TABS tablet TAKE 1 TABLET(5 MG) BY MOUTH TWICE DAILY 04/06/23   Nahser, Deloris Ping, MD  Bempedoic Acid (NEXLETOL) 180 MG TABS TAKE 1 TABLET BY MOUTH DAILY 08/09/22   Nahser, Deloris Ping,  MD  Cholecalciferol (VITAMIN D3) 50 MCG (2000 UT) capsule Take 1 capsule (2,000 Units total) by mouth daily. 01/24/23   Abagail Kitchens, PA-C  clopidogrel (PLAVIX) 75 MG tablet Take 2 tablets (150 mg total) by mouth on day one only, then decrease to taking 1 tablet (75 mg total) by mouth daily thereafter. 02/01/23   Nahser, Deloris Ping, MD  doxycycline (MONODOX) 50 MG capsule Take 50 mg by mouth every Monday, Wednesday, and Friday.    [provider]  fluticasone (FLONASE) 50 MCG/ACT nasal spray Place 2 sprays into both nostrils daily as needed for allergies or rhinitis. 11/23/22   Alveria Apley, NP  losartan (COZAAR) 25 MG tablet Take 1 tablet (25 mg total) by mouth daily. 04/23/23   Nahser, Deloris Ping, MD  metoprolol succinate (TOPROL-XL) 25 MG 24 hr tablet Take 0.5 tablets (12.5 mg total) by mouth at bedtime. 01/24/23   Abagail Kitchens, PA-C  Multiple Vitamins-Minerals (MULTIVITAMIN WOMEN 50+) TABS Take 1 tablet by mouth daily.    [provider]  nitroGLYCERIN (NITROSTAT) 0.4 MG SL tablet Place 1 tablet (0.4 mg total) under the tongue every 5 (five) minutes as needed for chest pain. 05/22/22   Nahser, Deloris Ping, MD  potassium chloride (KLOR-CON) 10 MEQ tablet TAKE 1 TABLET(10 MEQ) BY MOUTH DAILY 08/15/22   Nahser, Deloris Ping, MD  Propylene Glycol (SYSTANE COMPLETE) 0.6 % SOLN Place 1 drop into both eyes  3 (three) times daily.    [provider]      Allergies    Effexor [venlafaxine], Flomax [tamsulosin], Macrobid [nitrofurantoin], Other, B-12 compliance injection [cyanocobalamin], Imdur [isosorbide nitrate], Statins, and Zetia [ezetimibe]    Review of Systems   Review of Systems  Cardiovascular:  Positive for chest pain.    Physical Exam Updated Vital Signs BP (!) 171/108 Comment: after sitting for 10 min in triage.  Pulse 71   Temp 97.9 F (36.6 C)   Resp 20   Ht 5\' 1"  (1.549 m)   Wt 59 kg   SpO2 100%   BMI 24.56 kg/m  Physical Exam Constitutional:       General: She is not in acute distress. Cardiovascular:     Rate and Rhythm: Normal rate and regular rhythm.     Pulses: Normal pulses.     Heart sounds: Normal heart sounds.  Pulmonary:     Effort: Pulmonary effort is normal. No respiratory distress.     Breath sounds: Normal breath sounds.     Comments: Able to speak in full sentences Musculoskeletal:     Right lower leg: No tenderness. No edema.     Left lower leg: No tenderness. No edema.  Skin:    General: Skin is warm and dry.  Neurological:     Mental Status: She is alert.     Sensory: Sensation is intact.     Motor: Motor function is intact.     Coordination: Coordination is intact.     Gait: Gait is intact.     Comments: Cranial nerves III through XII intact Vision grossly intact Sensation tact in all 4 extremities  Psychiatric:        Mood and Affect: Mood normal.     ED Results / Procedures / Treatments   Labs (all labs ordered are listed, but only abnormal results are displayed) Labs Reviewed  BASIC METABOLIC PANEL - Abnormal; Notable for the following components:      Result Value   Potassium 3.4 (*)    All other components within normal limits  CBC  TROPONIN I (HIGH SENSITIVITY)  TROPONIN I (HIGH SENSITIVITY)    EKG None  Radiology DG Chest 2 View Result Date: 05/23/2023 CLINICAL DATA:  Chest pain.  Shortness of breath. EXAM: CHEST - 2 VIEW COMPARISON:  Chest radiograph dated 04/30/2023. FINDINGS: No focal consolidation, pleural effusion, pneumothorax. The cardiac silhouette is within normal limits. Coronary artery stent. No acute osseous pathology. IMPRESSION: No active cardiopulmonary disease. Electronically Signed   By: Elgie Collard M.D.   On: 05/23/2023 17:54    Procedures Procedures    Medications Ordered in ED Medications  LORazepam (ATIVAN) tablet 0.5 mg (0.5 mg Oral Given 05/23/23 1730)    ED Course/ Medical Decision Making/ A&P                                 Medical Decision  Making Amount and/or Complexity of Data Reviewed Labs: ordered. Radiology: ordered.   [name] 77 y.o. presented today for chest pain. Working DDx that I considered at this time includes, but not limited to, ACS, GERD, pulmonary embolism, community-acquired pneumonia, aortic dissection, pneumothorax, underlying bony abnormality, anemia, thyrotoxicosis, esophageal rupture, CHF exacerbation, valvular disorder, myocarditis, pericarditis, endocarditis, pericardial effusion/cardiac tamponade, pulmonary edema, gastritis/PUD, esophagitis.  R/o Dx: ACS, GERD, pulmonary embolism, community-acquired pneumonia, aortic dissection, pneumothorax, underlying bony abnormality, anemia, thyrotoxicosis, esophageal rupture, CHF  exacerbation, valvular disorder, myocarditis, pericarditis, endocarditis, pericardial effusion/cardiac tamponade, pulmonary edema, gastritis/PUD, esophagitis: These are considered less likely due to history of present illness and physical exam findings. Aortic Dissection: less likely based on the location, quality, onset, and severity of symptoms in this case. Patient also has a lack of underlying history of AD or TAA.   Review of prior external notes: 04/30/2023 ED  Unique Tests and My Interpretation:  EKG: Rate, rhythm, axis, intervals all examined and without medically relevant abnormality. ST segments without concerns for elevations Troponin: 6 CXR: no acute findings CBC: unremarkable BMP: unremarkable  Social Determinants of Health: none  Discussion with Independent Historian:  Husband  Discussion of Management of Tests: None  Risk: Medium: prescription drug management  Risk Stratification Score:none  Staffed with Zackowski, MD  Plan: On exam patient was in no acute distress with stable vitals. Patient's physical was unremarkable including neuro exam. Labs and CXR will be ordered. Patient stable at this time.  Labs and imaging came back reassuring.  Patient's  first troponin was negative and since has been on for 2 weeks we only need 1 troponin.  I discussed with the attending we agree that since patient states that she feels she is having a panic attack and is repeatedly checking her blood pressure do feel her hypertension is related to anxiety and will give a small dose of Ativan here and monitor and make sure she is safe to go home.  Patient's husband is here to drive her home.  Patient does have appointment with her primary care provider in the morning and so she does have close follow-up as well.  I will encourage the patient to follow-up with a cardiologist as well due to continued chest pain however do feel this is more related to anxiety versus cardiogenic causes given her reassuring cath back in August and labs and physical exam today.  On recheck patient was doing slightly better and blood pressure was reasonable.  With patient's labs and imaging all being reassuring low suspicion of hypertensive urgency/emergency at this time will not drop her blood pressure acutely to avoid causing stroke.  I spoke to the patient what following up with her primary care provider tomorrow she already has an appointment to call her cardiologist to discuss blood pressure meds that she is only 50 mg of losartan and then this can be increased.  Patient is a pleasant to drive her home.  Patient states she feels safe to be discharged and would like to follow-up in the outpatient setting.  Patient was given return precautions. Patient stable for discharge at this time.  Patient verbalized understanding of plan.  This chart was dictated using voice recognition software.  Despite best efforts to proofread,  errors can occur which can change the documentation meaning.         Final Clinical Impression(s) / ED Diagnoses Final diagnoses:  Hypertension, unspecified type    Rx / DC Orders ED Discharge Orders     None         Remi Deter 05/23/23  1815    Vanetta Mulders, MD 05/25/23 2133

## 2023-05-23 NOTE — Telephone Encounter (Signed)
Reason for Disposition . [1] Shoulder pains with exertion (e.g., walking) AND [2] pain goes away on resting AND [3] not present now  Answer Assessment - Initial Assessment Questions 1. ONSET: "When did the pain start?"     Two weeks ago.  2. LOCATION: "Where is the pain located?"     Right, Medial Shoulder blade, radiates to the chest, centered to the right side.  3. PAIN: "How bad is the pain?" (Scale 1-10; or mild, moderate, severe)   - MILD (1-3): doesn't interfere with normal activities   - MODERATE (4-7): interferes with normal activities (e.g., work or school) or awakens from sleep   - SEVERE (8-10): excruciating pain, unable to do any normal activities, unable to move arm at all due to pain     5 4. WORK OR EXERCISE: "Has there been any recent work or exercise that involved this part of the body?"     No 5. CAUSE: "What do you think is causing the shoulder pain?"     Stress, Anxiety 6. OTHER SYMPTOMS: "Do you have any other symptoms?" (e.g., neck pain, swelling, rash, fever, numbness, weakness)     Dizziness 7. PREGNANCY: "Is there any chance you are pregnant?" "When was your last menstrual period?"     NO  Protocols used: Shoulder Pain-A-AH

## 2023-05-23 NOTE — ED Triage Notes (Signed)
Intermittent CP x2 weeks. Has been hypertensive at home, working with PCP to adjust medications. 2 NTG at home no relief. Trying to lower sodium in diet. CP is across top of chest, both sides, into back, some SOB. Increased anxiety. 200's sysBP in triage. Hx multiple MI's. Last in 8/24.

## 2023-05-24 ENCOUNTER — Ambulatory Visit (INDEPENDENT_AMBULATORY_CARE_PROVIDER_SITE_OTHER): Payer: Medicare Other | Admitting: Family Medicine

## 2023-05-24 VITALS — BP 150/92 | HR 98 | Temp 97.8°F | Ht 61.0 in | Wt 133.2 lb

## 2023-05-24 DIAGNOSIS — I10 Essential (primary) hypertension: Secondary | ICD-10-CM | POA: Diagnosis not present

## 2023-05-24 DIAGNOSIS — F419 Anxiety disorder, unspecified: Secondary | ICD-10-CM | POA: Insufficient documentation

## 2023-05-24 MED ORDER — LOSARTAN POTASSIUM 100 MG PO TABS: 100.0000 mg | ORAL_TABLET | Freq: Every day | ORAL | 3 refills | Status: DC

## 2023-05-24 MED ORDER — SERTRALINE HCL 25 MG PO TABS: 25.0000 mg | ORAL_TABLET | Freq: Every day | ORAL | 3 refills | Status: DC

## 2023-05-24 MED ORDER — CLONAZEPAM 0.5 MG PO TABS: 0.5000 mg | ORAL_TABLET | Freq: Two times a day (BID) | ORAL | 1 refills | Status: DC | PRN

## 2023-05-24 NOTE — Progress Notes (Signed)
   Subjective:    Patient ID: Shannon Cummings, female    DOB: [birth year], 77 y.o.   MRN: [id number]  HPI HTN- pt reports BP has been quite high.  Yesterday in ER BP was 209/111.  Had normal Troponin and CXR.  .  On Losartan 50mg  daily, Metoprolol XL 25mg  daily  Anxiety- ongoing issue.  Sxs are severe but she has always been afraid to take something.  Pt states she is willing to try something at this point b/c 'right now my life is in shambles'.  Was given Lorazepam 0.5mg  in the ER.  Didn't notice significant difference but was able to sleep better last night.    Review of Systems For ROS see HPI     Objective:   Physical Exam Vitals reviewed.  Constitutional:      General: She is not in acute distress.    Appearance: Normal appearance. She is well-developed. She is not ill-appearing.  HENT:     Head: Normocephalic and atraumatic.  Eyes:     Conjunctiva/sclera: Conjunctivae normal.     Pupils: Pupils are equal, round, and reactive to light.  Neck:     Thyroid: No thyromegaly.  Cardiovascular:     Rate and Rhythm: Normal rate and regular rhythm.     Heart sounds: Normal heart sounds. No murmur heard. Pulmonary:     Effort: Pulmonary effort is normal. No respiratory distress.     Breath sounds: Normal breath sounds.  Abdominal:     General: There is no distension.     Palpations: Abdomen is soft.     Tenderness: There is no abdominal tenderness.  Musculoskeletal:     Cervical back: Normal range of motion and neck supple.  Lymphadenopathy:     Cervical: No cervical adenopathy.  Skin:    General: Skin is warm and dry.  Neurological:     General: No focal deficit present.     Mental Status: She is alert and oriented to person, place, and time.  Psychiatric:        Mood and Affect: Mood normal.        Behavior: Behavior normal.        Thought Content: Thought content normal.           Assessment & Plan:

## 2023-05-24 NOTE — Patient Instructions (Addendum)
Follow up in 4 weeks to recheck anxiety START the Sertraline once daily USE the Clonazepam as needed for high stressed/panicked moments INCREASE the Losartan to 100mg  daily- new prescription sent! Call with any questions or concerns Stay Safe!  Stay Healthy! Hang in there!! Happy Holidays!!!

## 2023-05-24 NOTE — Assessment & Plan Note (Signed)
Deteriorated.  Pt's BP has been running higher than usual.  She has been working w/ Cardiology on this.  She feels it's likely stress related.  Currently on Losartan 50mg  daily and Metoprolol XL 25mg  daily.  Will increase Losartan to 100mg  and also work on underlying anxiety.  Pt expressed understanding and is in agreement w/ plan.

## 2023-05-24 NOTE — Assessment & Plan Note (Signed)
New.  Pt reports this is an ongoing issue for her but getting worse.  She has always been fearful to take medication but feels that she is ready now.  Open to idea of daily medication but will also need short term rescue medication while SSRI gets in her system.  Will start Sertraline 25mg  daily and add Clonazepam 0.5mg  prn.  Pt expressed understanding and is in agreement w/ plan.

## 2023-06-07 ENCOUNTER — Encounter (HOSPITAL_COMMUNITY): Payer: Self-pay

## 2023-06-07 ENCOUNTER — Other Ambulatory Visit: Payer: Self-pay

## 2023-06-07 ENCOUNTER — Emergency Department (HOSPITAL_COMMUNITY)
Admission: EM | Admit: 2023-06-07 | Discharge: 2023-06-08 | Disposition: A | Discharge status: Home / Self Care | Payer: Medicare Other | Attending: Emergency Medicine | Admitting: Emergency Medicine

## 2023-06-07 ENCOUNTER — Emergency Department (HOSPITAL_COMMUNITY): Payer: Medicare Other

## 2023-06-07 DIAGNOSIS — R002 Palpitations: Secondary | ICD-10-CM | POA: Diagnosis not present

## 2023-06-07 DIAGNOSIS — Z7902 Long term (current) use of antithrombotics/antiplatelets: Secondary | ICD-10-CM | POA: Diagnosis not present

## 2023-06-07 DIAGNOSIS — I10 Essential (primary) hypertension: Secondary | ICD-10-CM | POA: Insufficient documentation

## 2023-06-07 DIAGNOSIS — Z1152 Encounter for screening for COVID-19: Secondary | ICD-10-CM | POA: Diagnosis not present

## 2023-06-07 DIAGNOSIS — Z7901 Long term (current) use of anticoagulants: Secondary | ICD-10-CM | POA: Insufficient documentation

## 2023-06-07 DIAGNOSIS — R42 Dizziness and giddiness: Secondary | ICD-10-CM | POA: Diagnosis not present

## 2023-06-07 DIAGNOSIS — Z955 Presence of coronary angioplasty implant and graft: Secondary | ICD-10-CM | POA: Diagnosis not present

## 2023-06-07 DIAGNOSIS — R7989 Other specified abnormal findings of blood chemistry: Secondary | ICD-10-CM

## 2023-06-07 DIAGNOSIS — Z79899 Other long term (current) drug therapy: Secondary | ICD-10-CM | POA: Diagnosis not present

## 2023-06-07 DIAGNOSIS — R079 Chest pain, unspecified: Secondary | ICD-10-CM | POA: Diagnosis not present

## 2023-06-07 DIAGNOSIS — I2510 Atherosclerotic heart disease of native coronary artery without angina pectoris: Secondary | ICD-10-CM | POA: Insufficient documentation

## 2023-06-07 DIAGNOSIS — E871 Hypo-osmolality and hyponatremia: Secondary | ICD-10-CM | POA: Diagnosis not present

## 2023-06-07 DIAGNOSIS — R946 Abnormal results of thyroid function studies: Secondary | ICD-10-CM | POA: Insufficient documentation

## 2023-06-07 DIAGNOSIS — I252 Old myocardial infarction: Secondary | ICD-10-CM | POA: Insufficient documentation

## 2023-06-07 DIAGNOSIS — R457 State of emotional shock and stress, unspecified: Secondary | ICD-10-CM | POA: Diagnosis not present

## 2023-06-07 LAB — CBC WITH DIFFERENTIAL/PLATELET
Abs Immature Granulocytes: 0.02 10*3/uL (ref 0.00–0.07)
Basophils Absolute: 0 10*3/uL (ref 0.0–0.1)
Basophils Relative: 1 %
Eosinophils Absolute: 0.1 10*3/uL (ref 0.0–0.5)
Eosinophils Relative: 1 %
HCT: 39.7 % (ref 36.0–46.0)
Hemoglobin: 13.4 g/dL (ref 12.0–15.0)
Immature Granulocytes: 0 %
Lymphocytes Relative: 26 %
Lymphs Abs: 1.5 10*3/uL (ref 0.7–4.0)
MCH: 29.6 pg (ref 26.0–34.0)
MCHC: 33.8 g/dL (ref 30.0–36.0)
MCV: 87.6 fL (ref 80.0–100.0)
Monocytes Absolute: 0.4 10*3/uL (ref 0.1–1.0)
Monocytes Relative: 6 %
Neutro Abs: 3.8 10*3/uL (ref 1.7–7.7)
Neutrophils Relative %: 66 %
Platelets: 270 10*3/uL (ref 150–400)
RBC: 4.53 MIL/uL (ref 3.87–5.11)
RDW: 13.4 % (ref 11.5–15.5)
WBC: 5.8 10*3/uL (ref 4.0–10.5)
nRBC: 0 % (ref 0.0–0.2)

## 2023-06-07 LAB — COMPREHENSIVE METABOLIC PANEL
ALT: 18 U/L (ref 0–44)
AST: 59 U/L — ABNORMAL HIGH (ref 15–41)
Albumin: 4 g/dL (ref 3.5–5.0)
Alkaline Phosphatase: 65 U/L (ref 38–126)
Anion gap: 12 (ref 5–15)
BUN: 16 mg/dL (ref 8–23)
CO2: 19 mmol/L — ABNORMAL LOW (ref 22–32)
Calcium: 9.2 mg/dL (ref 8.9–10.3)
Chloride: 102 mmol/L (ref 98–111)
Creatinine, Ser: 0.96 mg/dL (ref 0.44–1.00)
GFR, Estimated: >60 mL/min (ref >60)
Glucose, Bld: 137 mg/dL — ABNORMAL HIGH (ref 70–99)
Potassium: 4.8 mmol/L (ref 3.5–5.1)
Sodium: 133 mmol/L — ABNORMAL LOW (ref 135–145)
Total Bilirubin: 1.3 mg/dL — ABNORMAL HIGH (ref 0.0–1.2)
Total Protein: 6.6 g/dL (ref 6.5–8.1)

## 2023-06-07 LAB — TROPONIN I (HIGH SENSITIVITY): Troponin I (High Sensitivity): 8 ng/L (ref <18)

## 2023-06-07 NOTE — ED Provider Notes (Signed)
  EMERGENCY DEPARTMENT AT [facility] Provider Note   CSN: [id number] Arrival date & time: 06/07/23  2104     History  Chief Complaint  Patient presents with   Palpitations   Hypertension    Shannon Cummings is a 78 y.o. female presents via EMS with episode of palpitations.  Patient states that she has been having difficulty with fluctuations in her blood pressure.  Has history of atrial fibrillation status post ablation.  States that all day long she has been feeling lightheaded, near syncopal.  When she arrived home this evening after day out, states that she suddenly felt a fluttering in her chest just like she did when she would get A-fib in the past, took an extra dose of her losartan  (typically on 50 mg twice daily, took 25 mg this morning, held off on evening dose secondary to normal BP, however he did end up taking additional 25 milligrams of losartan  this evening during episode of palpitations and elevated blood pressure greater than 200 at home.  At this time feels very fatigued, generally wiped out.  No chest pain at any point.  Still feels lightheaded.  I have reviewed her medical records.  Patient with history of hypertension, CAD, paroxysmal A-fib status post ablation, anticoagulated on Eliquis .  Per chart review patient was admitted to the hospital in August of this year for NSTEMI ejection fraction of 60 to 65% at that time.  No acute obstruction on left heart cath at that time though there was evidence of prior stenting.  Did have abnormal TSH at that time 9.9 Does not appear that patient had any follow-up thyroid  testing in the outpatient setting per chart review.  HPI     Home Medications Prior to Admission medications   Medication Sig Start Date End Date Taking? Authorizing Provider  acetaminophen  (TYLENOL ) 500 MG tablet Take 500 mg by mouth daily as needed for moderate pain, fever or headache.    [provider]  Alirocumab  (PRALUENT ) 75  MG/ML SOAJ ADMINISTER 1 ML UNDER THE SKIN EVERY 14 DAYS 04/06/23   Nahser, Aleene PARAS, MD  apixaban  (ELIQUIS ) 5 MG TABS tablet TAKE 1 TABLET(5 MG) BY MOUTH TWICE DAILY 04/06/23   Nahser, Aleene PARAS, MD  Bempedoic Acid  (NEXLETOL ) 180 MG TABS TAKE 1 TABLET BY MOUTH DAILY 08/09/22   Nahser, Aleene PARAS, MD  Cholecalciferol  (VITAMIN D3) 50 MCG (2000 UT) capsule Take 1 capsule (2,000 Units total) by mouth daily. 01/24/23   Darryle Thom LITTIE, PA-C  clonazePAM  (KLONOPIN ) 0.5 MG tablet Take 1 tablet (0.5 mg total) by mouth 2 (two) times daily as needed for anxiety. 05/24/23   Mahlon Comer BRAVO, MD  clopidogrel  (PLAVIX ) 75 MG tablet Take 2 tablets (150 mg total) by mouth on day one only, then decrease to taking 1 tablet (75 mg total) by mouth daily thereafter. 02/01/23   Nahser, Aleene PARAS, MD  doxycycline  (MONODOX ) 50 MG capsule Take 50 mg by mouth every Monday, Wednesday, and Friday.    [provider]  fluticasone  (FLONASE ) 50 MCG/ACT nasal spray Place 2 sprays into both nostrils daily as needed for allergies or rhinitis. 11/23/22   Billy Philippe SAUNDERS, NP  losartan  (COZAAR ) 100 MG tablet Take 1 tablet (100 mg total) by mouth daily. 05/24/23   Tabori, Katherine E, MD  metoprolol  succinate (TOPROL -XL) 25 MG 24 hr tablet Take 0.5 tablets (12.5 mg total) by mouth at bedtime. 01/24/23   Darryle Thom LITTIE, PA-C  Multiple Vitamins-Minerals (MULTIVITAMIN WOMEN  50+) TABS Take 1 tablet by mouth daily.    [provider]  nitroGLYCERIN  (NITROSTAT ) 0.4 MG SL tablet Place 1 tablet (0.4 mg total) under the tongue every 5 (five) minutes as needed for chest pain. 05/22/22   Nahser, Aleene PARAS, MD  potassium chloride  (KLOR-CON ) 10 MEQ tablet TAKE 1 TABLET(10 MEQ) BY MOUTH DAILY 08/15/22   Nahser, Aleene PARAS, MD  Propylene Glycol (SYSTANE COMPLETE) 0.6 % SOLN Place 1 drop into both eyes 3 (three) times daily.    [provider]  sertraline  (ZOLOFT ) 25 MG tablet Take 1 tablet (25 mg total) by mouth daily. 05/24/23   Tabori,  Katherine E, MD      Allergies    Effexor  [venlafaxine ], Flomax [tamsulosin], Macrobid [nitrofurantoin], Other, B-12 compliance injection [cyanocobalamin ], Imdur  [isosorbide  nitrate], Statins, and Zetia  [ezetimibe ]    Review of Systems   Review of Systems  Constitutional:  Positive for activity change.  HENT: Negative.    Cardiovascular:  Positive for palpitations.  Gastrointestinal: Negative.   Neurological:  Positive for light-headedness.    Physical Exam Updated Vital Signs BP (!) 153/107 (BP Location: Left Arm)   Pulse (!) 59   Temp 98.3 F (36.8 C) (Oral)   Resp 15   Ht 5' 1 (1.549 m)   Wt 59 kg   SpO2 95%   BMI 24.56 kg/m  Physical Exam Vitals and nursing note reviewed.  Constitutional:      Appearance: She is not ill-appearing or toxic-appearing.  HENT:     Head: Normocephalic and atraumatic.     Mouth/Throat:     Mouth: Mucous membranes are moist.     Pharynx: No oropharyngeal exudate or posterior oropharyngeal erythema.  Eyes:     General:        Right eye: No discharge.        Left eye: No discharge.     Extraocular Movements: Extraocular movements intact.     Conjunctiva/sclera: Conjunctivae normal.     Pupils: Pupils are equal, round, and reactive to light.  Cardiovascular:     Rate and Rhythm: Normal rate and regular rhythm.     Pulses: Normal pulses.     Heart sounds: Normal heart sounds.  Pulmonary:     Effort: Pulmonary effort is normal. No respiratory distress.     Breath sounds: Normal breath sounds. No wheezing or rales.  Abdominal:     General: There is no distension.     Palpations: Abdomen is soft.     Tenderness: There is no abdominal tenderness. There is no rebound.  Musculoskeletal:        General: No deformity.     Cervical back: Neck supple.     Right lower leg: No edema.     Left lower leg: No edema.  Skin:    General: Skin is warm and dry.     Capillary Refill: Capillary refill takes less than 2 seconds.  Neurological:      General: No focal deficit present.     Mental Status: She is alert and oriented to person, place, and time. Mental status is at baseline.  Psychiatric:        Mood and Affect: Mood normal.     ED Results / Procedures / Treatments   Labs (all labs ordered are listed, but only abnormal results are displayed) Labs Reviewed  COMPREHENSIVE METABOLIC PANEL - Abnormal; Notable for the following components:      Result Value   Sodium 133 (*)  CO2 19 (*)    Glucose, Bld 137 (*)    AST 59 (*)    Total Bilirubin 1.3 (*)    All other components within normal limits  TSH - Abnormal; Notable for the following components:   TSH 6.405 (*)    All other components within normal limits  RESP PANEL BY RT-PCR (RSV, FLU A&B, COVID)  RVPGX2  CBC WITH DIFFERENTIAL/PLATELET  PROTIME-INR  TROPONIN I (HIGH SENSITIVITY)  TROPONIN I (HIGH SENSITIVITY)    EKG EKG Interpretation Date/Time:  Thursday June 07 2023 22:44:08 EST Ventricular Rate:  63 PR Interval:  203 QRS Duration:  101 QT Interval:  433 QTC Calculation: 444 R Axis:   63  Text Interpretation: Sinus rhythm Confirmed by Cottie Cough [phone]) on 06/07/2023 10:57:39 PM  Radiology DG Chest 2 View Result Date: 06/07/2023 CLINICAL DATA:  Chest pain. EXAM: CHEST - 2 VIEW COMPARISON:  05/23/2023 FINDINGS: The cardiomediastinal contours are normal. Coronary stent visualized. The lungs are clear. Pulmonary vasculature is normal. No consolidation, pleural effusion, or pneumothorax. No acute osseous abnormalities are seen. IMPRESSION: No active cardiopulmonary disease. Electronically Signed   By: Andrea Gasman M.D.   On: 06/07/2023 23:53    Procedures Procedures    Medications Ordered in ED Medications - No data to display  ED Course/ Medical Decision Making/ A&P                                 Medical Decision Making 78 year old female presents with palp, lightheadedness and episode of near syncope with elevated blood  pressures.  Hypertensive on intake, vital signs otherwise reassuring.  Cardiopulmonary exam unremarkable, abdominal exam is benign.  Patient is nonfocal neurologically.  DDx includes not limited to ACS, PE, pleural effusion, dysrhythmia, metabolic derangement, thyroid  anomaly, vasovagal episode.    Amount and/or Complexity of Data Reviewed Labs: ordered.    Details: CBC unremarkable, CMP with mild hyponatremia of 133, mildly elevated AST to 59, mildly elevated total bili to 1.3.  TSH elevated to 6.4.  Previously 9.9, follow-up with 3.8 in the outpatient setting, now back up to 6.4.  INR is normal, troponin is normal x 2 8 and then 11.  Radiology: ordered.    Details:   Chest x-ray visualized this provider negative for acute cardiopulmonary disease ECG/medicine tests:     Details: EKG with normal sinus rhythm without ischemic changes or interval changes   Overall workup is very reassuring.  Doubt ACS.  Question dysrhythmia not witnessed on telemetry in the emergency department versus possible thyroid  anomaly.  Orthostatics reassuring.  Overall patient is very well-appearing, reassuring vitals and telemetry throughout her stay in the emergency department with normal sinus rhythm.  Clinical concern for emergent underlying condition that would warrant further ED workup or inpatient management is exceedingly low.  Recommend close outpatient follow-up with her cardiologist and PCP.  Strict return precautions are given.  Cherre voiced understanding of her medical evaluation and treatment plan. Each of their questions answered to their expressed satisfaction.  Return precautions were given.  Patient is well-appearing, stable, and was discharged in good condition.  This chart was dictated using voice recognition software, Dragon. Despite the best efforts of this provider to proofread and correct errors, errors may still occur which can change documentation meaning.          Final Clinical  Impression(s) / ED Diagnoses Final diagnoses:  Palpitations  TSH elevation    Rx /  DC Orders ED Discharge Orders     None         Camylle Whicker R, PA-C 06/08/23 0230    Mesner, Selinda, MD 06/09/23 [phone]

## 2023-06-07 NOTE — ED Triage Notes (Signed)
 Pt BIB [facility] from home c/o palpitations and HTN. 210/100 was her BP at home. Pt did take a propanolol before coming. Pt feels lightheaded. Pt states her chest is beginning to hurt as well.    180/110 98 324 ASA fire gave  20g LAC

## 2023-06-07 NOTE — ED Provider Triage Note (Signed)
 Emergency Medicine Provider Triage Evaluation Note  Shannon Cummings , a 78 y.o. female  was evaluated in triage.  Pt complains of dizziness and headache.  Patient has acute onset of dizziness earlier today.  Patient's blood pressure apparently was over 200.  Patient states that she does not feel well and feels that she is going to pass out.  Patient is ill-appearing and in triage  Review of Systems  Positive: dizziness Negative:   Physical Exam  BP (!) 181/121 (BP Location: Left Arm)   Pulse 94   Temp 97.8 F (36.6 C) (Oral)   Resp (!) 22   Ht 5' 1 (1.549 m)   Wt 59 kg   SpO2 100%   BMI 24.56 kg/m  Gen:   Awake, uncomfortable, ill appearing Resp:  Normal effort  MSK:   Moves extremities without difficulty  Other:    Medical Decision Making  Medically screening exam initiated at 9:27 PM.  Appropriate orders placed.  Shannon Cummings was informed that the remainder of the evaluation will be completed by another provider, this initial triage assessment does not replace that evaluation, and the importance of remaining in the ED until their evaluation is complete.  Shannon Cummings is a 78 y.o. female here with dizziness and chest pain.  Patient is ill-appearing.  Will move to the main ED and I send off basic labs    Patt Alm Macho, MD 06/07/23 2128

## 2023-06-08 DIAGNOSIS — R002 Palpitations: Secondary | ICD-10-CM | POA: Diagnosis not present

## 2023-06-08 LAB — TROPONIN I (HIGH SENSITIVITY): Troponin I (High Sensitivity): 11 ng/L (ref <18)

## 2023-06-08 LAB — RESP PANEL BY RT-PCR (RSV, FLU A&B, COVID)  RVPGX2
Influenza A by PCR: NEGATIVE
Influenza B by PCR: NEGATIVE
Resp Syncytial Virus by PCR: NEGATIVE
SARS Coronavirus 2 by RT PCR: NEGATIVE

## 2023-06-08 LAB — TSH: TSH: 6.405 u[IU]/mL — ABNORMAL HIGH (ref 0.350–4.500)

## 2023-06-08 LAB — PROTIME-INR
INR: 1.1 (ref 0.8–1.2)
Prothrombin Time: 14.1 s (ref 11.4–15.2)

## 2023-06-08 NOTE — Discharge Instructions (Addendum)
 You were seen in the ER today for your palpitations and lightheadedness. Please follow up with your cardiologist for your elevated thyroid level and your palpitations. Return to the ER with any new severe symptoms.

## 2023-06-15 ENCOUNTER — Ambulatory Visit: Payer: Medicare Other | Admitting: Family Medicine

## 2023-06-15 ENCOUNTER — Encounter: Payer: Self-pay | Admitting: Family Medicine

## 2023-06-15 VITALS — BP 112/72 | HR 69 | Temp 97.8°F | Ht 61.0 in | Wt 131.2 lb

## 2023-06-15 DIAGNOSIS — R946 Abnormal results of thyroid function studies: Secondary | ICD-10-CM | POA: Diagnosis not present

## 2023-06-15 DIAGNOSIS — I10 Essential (primary) hypertension: Secondary | ICD-10-CM

## 2023-06-15 DIAGNOSIS — I480 Paroxysmal atrial fibrillation: Secondary | ICD-10-CM

## 2023-06-15 DIAGNOSIS — R7989 Other specified abnormal findings of blood chemistry: Secondary | ICD-10-CM

## 2023-06-15 DIAGNOSIS — K219 Gastro-esophageal reflux disease without esophagitis: Secondary | ICD-10-CM | POA: Diagnosis not present

## 2023-06-15 DIAGNOSIS — R0989 Other specified symptoms and signs involving the circulatory and respiratory systems: Secondary | ICD-10-CM

## 2023-06-15 LAB — T4, FREE: Free T4: 0.84 ng/dL (ref 0.60–1.60)

## 2023-06-15 LAB — TSH: TSH: 3.76 u[IU]/mL (ref 0.35–5.50)

## 2023-06-15 LAB — T3, FREE: T3, Free: 3.2 pg/mL (ref 2.3–4.2)

## 2023-06-15 MED ORDER — LOSARTAN POTASSIUM 25 MG PO TABS: 50.0000 mg | ORAL_TABLET | Freq: Every day | ORAL | 3 refills | Status: DC

## 2023-06-15 MED ORDER — SERTRALINE HCL 50 MG PO TABS: 50.0000 mg | ORAL_TABLET | Freq: Every day | ORAL | 3 refills | Status: DC

## 2023-06-15 MED ORDER — PANTOPRAZOLE SODIUM 40 MG PO TBEC: 40.0000 mg | DELAYED_RELEASE_TABLET | Freq: Every day | ORAL | 1 refills | Status: DC

## 2023-06-15 NOTE — Progress Notes (Signed)
   Subjective:    Patient ID: Shannon Cummings, female    DOB: 1945-11-27, 78 y.o.   MRN: [id number]  HPI ER f/u- pt has been to the ER on 11/25, 12/18, and 1/2 for elevated BP and palpitations.  After the 11/25 and 12/18 visits she had an appt w/ me on 12/19 and we increased her Losartan  to 100mg  daily.  She was previously on Metoprolol  but reports Dr Alveta told her to stop this.  At that time she also felt that a lot of her sxs were anxiety related.  Based on this, we started Sertraline  25mg  daily.    Pt reports increasing the Losartan  caused her BP to drop too low so she went back to using the Losartan  25mg  based on her BP readings.  Some days taking 2 tabs, others taking none.  Does have Propranolol  available at home to take as needed if Afib develops.  She notes some improvement in anxiety since starting Sertraline .  Has some nausea.  Eating improves nausea.  Has been taking Pantoprazole  daily but has run out.    During her last ER visit on 1/2, her TSH was noted to be mildly elevated at 6.4   Review of Systems For ROS see HPI     Objective:   Physical Exam Vitals reviewed.  Constitutional:      General: She is not in acute distress.    Appearance: Normal appearance. She is well-developed. She is not ill-appearing.  HENT:     Head: Normocephalic and atraumatic.  Eyes:     Conjunctiva/sclera: Conjunctivae normal.     Pupils: Pupils are equal, round, and reactive to light.  Neck:     Thyroid : No thyromegaly.  Cardiovascular:     Rate and Rhythm: Normal rate and regular rhythm.     Pulses: Normal pulses.     Heart sounds: Normal heart sounds. No murmur heard. Pulmonary:     Effort: Pulmonary effort is normal. No respiratory distress.     Breath sounds: Normal breath sounds.  Abdominal:     General: There is no distension.     Palpations: Abdomen is soft.     Tenderness: There is no abdominal tenderness.  Musculoskeletal:     Cervical back: Normal range of motion and neck  supple.  Lymphadenopathy:     Cervical: No cervical adenopathy.  Skin:    General: Skin is warm and dry.  Neurological:     Mental Status: She is alert and oriented to person, place, and time.  Psychiatric:        Behavior: Behavior normal.           Assessment & Plan:  Abnormal TSH- during hospitalization in August, TSH was elevated in the 9 range.  Subsequent testing in September showed that this returned to normal range.  ER visit on 1/2 again showed mildly elevated TSH- around 6.  Doubt this is causing her labile BP or afib but Shannon repeat TSH and check free T3 and free T4 to r/o underlying thyroid  cause.

## 2023-06-15 NOTE — Patient Instructions (Signed)
 Follow up in 3-4 weeks to recheck mood and BP We'll notify you of your lab results and make any changes if needed INCREASE the Sertraline  to 50mg  daily- 2 of what you have at home and 1 of the new prescription TAKE the Losartan  as needed based on your blood pressure readings- check once in the morning and 1 other time throughout the day RESTART the Pantoprazole  for the reflux USE the Propranolol  as needed for palpitations Try and limit caffeine as this can worsen palpitations Call with any questions or concerns Stay Safe!  Stay Healthy! Hang in there!

## 2023-06-16 NOTE — Assessment & Plan Note (Signed)
 Deteriorated.  Pt has recently had labile BP- ranging from 200 systolic on medication to 103 systolic without medication.  She had to stop daily Losartan  due to hypotension and is now checking home BP 2-3x/day and taking meds prn.  Home cuff does correlate w/ office readings.  She states that until recently, BP has been well controlled and she hasn't had any issues.  She reports she has been to Cardiology and they haven't been able to explain the recent changes.  Although unlikely, will get 24 hr urine to assess for possible Pheo- given her labile BP, recurrence of Afib, pre-syncopal sensations.  Will continue to use Losartan  25mg  PRN- new prescription sent.  Pt expressed understanding and is in agreement w/ plan.

## 2023-06-16 NOTE — Assessment & Plan Note (Signed)
 Deteriorated.  She had been taking Pantoprazole from her husband but she has run out of this.  Prescription sent to pharmacy.

## 2023-06-16 NOTE — Assessment & Plan Note (Signed)
 Deteriorated.  Pt reports she has not had an episode since she had her ablation.  On 1/2 she reports she knows she was in Afib but this had resolved by the time she got to the ER.  She has propanolol to take PRN if she feels her HR speeding up and she reports up until this most recent episode, that has been effective at preventing an Afib episode.  She says she has enough propranolol  at home but wants to know why she is having this- along w/ her labile BP.  Will get urine metanephrines to assess for possible Pheo.

## 2023-06-18 ENCOUNTER — Telehealth: Payer: Self-pay

## 2023-06-18 NOTE — Telephone Encounter (Signed)
-----   Message from Neena Rhymes sent at 06/18/2023  4:11 PM EST ----- TSH is normal and so are T3 and T4.  This is great news!!

## 2023-06-18 NOTE — Telephone Encounter (Signed)
 Pt has been notified.

## 2023-06-19 ENCOUNTER — Other Ambulatory Visit: Payer: Medicare Other

## 2023-06-19 DIAGNOSIS — R0989 Other specified symptoms and signs involving the circulatory and respiratory systems: Secondary | ICD-10-CM | POA: Diagnosis not present

## 2023-06-19 DIAGNOSIS — I10 Essential (primary) hypertension: Secondary | ICD-10-CM | POA: Diagnosis not present

## 2023-06-19 DIAGNOSIS — I480 Paroxysmal atrial fibrillation: Secondary | ICD-10-CM | POA: Diagnosis not present

## 2023-06-19 NOTE — Progress Notes (Signed)
 Pt dropped off 24 hour urine sample, sent out to [facility] per order sheet

## 2023-06-24 LAB — METANEPHRINES, URINE, 24 HOUR
METANEPHRINE: 26 ug/(24.h) — ABNORMAL LOW (ref 90–315)
METANEPHRINES, TOTAL: 214 ug/(24.h) — ABNORMAL LOW (ref 224–832)
NORMETANEPHRINE: 188 ug/(24.h) (ref 122–676)
Total Volume: 750 mL

## 2023-06-25 ENCOUNTER — Telehealth: Payer: Self-pay

## 2023-06-25 ENCOUNTER — Encounter: Payer: Self-pay | Admitting: Family Medicine

## 2023-06-25 NOTE — Telephone Encounter (Signed)
Pt has reviewed labs via MyChart

## 2023-06-25 NOTE — Telephone Encounter (Signed)
-----   Message from Neena Rhymes sent at 06/25/2023  7:39 AM EST ----- Your 24 hr urine shows [place] that your metanephrines are actually low rather than high.  This means there is no concern for an adrenaline secreting tumor- which is good news.  Please schedule a follow up in the blood pressures continue to fluctuate or your symptoms continue.

## 2023-07-04 ENCOUNTER — Telehealth: Payer: Self-pay | Admitting: Cardiovascular Disease

## 2023-07-04 NOTE — Telephone Encounter (Signed)
Pt c/o medication issue:  1. Name of Medication:   Alirocumab (PRALUENT) 75 MG/ML SOAJ   2. How are you currently taking this medication (dosage and times per day)?   As prescribed  3. Are you having a reaction (difficulty breathing--STAT)?  No  4. What is your medication issue?   Patient stated she has been on a program to get this medication.  Patient stated she wants to re-apply for the [employer] program as the medication is too expensive.

## 2023-07-05 ENCOUNTER — Encounter: Payer: Self-pay | Admitting: Pharmacist

## 2023-07-05 NOTE — Telephone Encounter (Signed)
[employer] Grant renewed. Info set via mychart    CARD NO.?[id number]   CARD STATUS?Active   NFA?[id number]   PCN?[id number]   PC [id number]

## 2023-07-30 ENCOUNTER — Encounter: Payer: Self-pay | Admitting: Cardiovascular Disease

## 2023-07-30 NOTE — Progress Notes (Unsigned)
 Cardiology Office Note   Date:  07/31/2023   ID:  Shannon Cummings, Shannon Cummings 1945-09-28, MRN [id number]  PCP:  Sheliah Hatch, MD  Cardiologist:   Kristeen Miss, MD   Chief Complaint  Patient presents with   Coronary Artery Disease        Atrial Fibrillation   Congestive Heart Failure        1. Coronary artery disease-status post stenting in the past.  She status post stenting of her proximal LAD  08/14/2012 2. Intermittent atrial fibrillation 3.  hyperlipidemia-she has been generally intolerant to most statins   Pt is doing well.  No cardiac complaints.     She complains of some tingling and numbness associated with the Crestor.  She has discontinued the Crestor because of that reason.  She has had reactions to most statins that we have tried.  October 08-2011  She has done well.  She has not had any angina or eposides of atrial fib.  She has retired and is traveling quite a bit.  Her husband has also retired.  She has not had any problems.  October 01, 2012:  She is having some bruising ( due to [place]).  She is enjoying the cardiac rehab.  She has noticed some low BP readings at the end of cardiac rehab.  She does not feel bad.  These low readings are typically asymptomatic.    No angina.  No arrhythmias.   August 20, 2013:  Shannon Cummings is doing ok.  She was admitted to the hospital with some CP recently.  Rule out for MI.    She has been doing Ok.  Has gained a bit of weight.   She is not able to exercise because  Of left ankle problems.    August 19, 2014:   Shannon Cummings is a 78 y.o. female who presents for follow up of her atrial fib. Has gained some weight.  She is in the study for lipid management Has had leg cramps since last fall. Off and on  Has had more paroxysmal atrial fib.  Lasts about a minute. Seem to be occuring more frequently.   Jan. 18, 2017:  Shannon Cummings presents for follow up of her CAD and atrial fib.  Doing well.   Has visited [place]  recently .   No CP or dyspnea.   Tolerates the Rythmol.  Talked about her daughter who lives in [place].  Is not having any significant atrial fib issues.   Has been on lipid lowering study drug at [facility]  ( injectable cholesterol medication )   Jan.   25, 2018:  Doing well.   Feeling well Has had a stomach bug last week and now has a URI  - not the flu.   Has maintained NSR .  Is having hair loss.  Her research shows that amlodipine and atenolol can cause this   Aug. 23, 2018:  Shannon Cummings is seen today for eval of some elevated BP Has been having pain in her back, tightness in her chest  Went to ER.  Has had elevated BP for a week,   Went back to ER  Doubled her metoprolol XL .  Saw Tereso Newcomer on Aug. 6. 2018 Was given Imdur - developed a headache that lasted 3 days  Lexiscan myoview was low risk.   Her back pain / chest tightness has resolved.   May 08, 2017: Shannon Cummings seen today for follow-up of her hypertension, coronary artery disease, paroxysmal atrial  fibrillation. Is feeling well  Is taking the Toprol XL just once a day - not BID as listed in MAR .   August 15, 2017   Shannon Cummings is doing well BP is much better.  Is exercising and watching her diet.   Is on a study drug for cholesterol   June 18, 2018: He was admitted to the hospital recently with a non-ST segment elevation myocardial infarction. The previously placed LAD stent was widely patent.  She had a tight stenosis in her first obtuse marginal artery that was stented. Function is mildly reduced with an EF of 45 to 50%.  She returned to the ER the day after she went home with an episode of rapid AF and CP .    Troponin was  0.18  Her rhythmol has been stopped . Saw [name] and was started on Amiodarone . The plan is for short term Amio and then consider AF ablation . They also discussed Tikosyn .  She tolerates her AF poorly.  We discussed taking short acting beta blockers if she has AF  with CP  Had been walking ,   Has been weak since the MI.    Feb. 26, 2020  Shannon Cummings is seen for follow up of her CAD and PAF Has had some shortness of breath . BP is a bit elevated today  amio has been reduced to 200 mg a day  Has an appt with Dr. Johney Frame on March 23 to discuss ablation  Is in a lipid study   Nov. 22, 2021: Shannon Cummings is seen for follow up of her CAD, PAF . She had a NSTEMI in Dec.   Propafaone was stopped after that hospitalization.  Was started on Amiodarone  Doing well from a cardiac standpoint.  Is on lots abx.  Has not been exercising .    Will check lipids, liver enz, bmp today  Is being treated for C.diff colitis   Jan. 9, 2023 Shannon Cummings is seen for follow up of her CAD, PAF She had previously been on profafanone until a NSTEMI several years ago.   Was started on amio.   We referred her for AF ablation given the side effects of amio.   Was seen by [facility].  She has had an atrial fib ablation since I last saw her. Continues with Eliquis.   No cp, Not as much exercise as she should be getting  Has lost some weight  - wt is 133 lbs    Feb. 28, 2024 Shannon Cummings is seen for follow up of her CAD, PAF.  S/p afib ablation She was seen in the hospital on June 02, 2022 with episodes of chest pain.  Felt like she was going to pass out .  She had taken 2 nitroglycerin without any relief. Troponins x 2 were normal (3, 4).  No further episodes of CP .   She is going back to [place] this spring .  Going to [place]  She has been on [employer] and bempedoic acid.  Despite our best efforts her LDL is still 96.  She started about 200.   Feb 01, 2023  Shannon Cummings is seen for follow up of her CAD, PAF, afib ablation Her husband had a stroke at the end of July, Had carotid surgery   She had angina , was admitted to [facility]  Had a very tough time while in the ER ,  Is very dissatisfied with her care  - trouble with her nurse call button.  She called the operator from her ER   room Called a total of 5 times to the operator   Troponins were 1995, 1771  Cath showed her CAD as stable.  Troponin elevation was thought to be due to spasm  Mild anterolateral hypokinesis  Echo shows normal LV EF  60-65%  RV function is mildly reduced  Has not taken her Losartan in weeks .  Has been DC'd.  Was started on metoprolol following hospitalization   No further episodes of CP  Is having orthostatic hypotension   Nov. 18, 2024 Shannon Cummings is seen for follow up of her CAD Had a NSTEMI in July  Cath revealed stable,  CP/ troponin elevation was thought to be due to spasm   Still upset about her care during a hospitalization in June  BP has been variable  - up and down  Has been taking Losartan 25-50 mg if her BP runs high Avoids salt for the most part  Only moderate ETOH intake  We discussed my retirement in June. I will have her follow up with Dr. Jacques Navy   Feb. 25, 2025  Shannon Cummings is seen for follow up of her HTN, orthostatic hypotension BP has been labile recently  She was seen in the ER for palpitations on Jan. 2, 2025 ECG showed NSR   BP was very elevated a month ago   She knows that she is overly stressed  Saw Dr. Beverely Low  - was started on Zoloft 25,  was increased to 50   BP has been well controlled .  No CP      Past Medical History:  Diagnosis Date   Arthritis    CAD (coronary artery disease)    a. prior stenting history. b. NSTEMI s/p DES to prox LAD with EF 45% by cath 08/14/12. c. Mild trop elevation shortly after cath ?mild plaque embolization - patent stent on relook. // d. Myoview 8/18: EF 60, normal perfusion; Low Risk. e. NSTEMI 06/2018- patent prior LAD stent, 80% OM1 s/p DES, normal LVEDP, EF 45-50%, moderate residual disease treated medically.   Fatty infiltration of liver    GERD (gastroesophageal reflux disease)    Hyperlipidemia    Hypertension    Hypokalemia    Hypotension    Myocardial infarction (HCC) 2014   OSA on CPAP     Osteoporosis    PAF (paroxysmal atrial fibrillation) (HCC)    On rythmol previously   Pulmonary nodule    a. 8mm subpleural nodular density CT 08/2012, instructed to f/u pulm MD.   Sinus bradycardia    Tubular adenoma of colon 2007    Past Surgical History:  Procedure Laterality Date   ATRIAL FIBRILLATION ABLATION N/A 07/27/2020   Procedure: ATRIAL FIBRILLATION ABLATION;  Surgeon: Hillis Range, MD;  Location: [facility] INVASIVE CV LAB;  Service: Cardiovascular;  Laterality: N/A;   COLONOSCOPY  03/07/2016   COLONOSCOPY WITH PROPOFOL  07/04/2021   CORONARY ANGIOPLASTY WITH STENT PLACEMENT  08/14/2012   LAD  Dr Tonny Bollman   CORONARY STENT INTERVENTION N/A 06/14/2018   Procedure: CORONARY STENT INTERVENTION;  Surgeon: Corky Crafts, MD;  Location: [facility] INVASIVE CV LAB;  Service: Cardiovascular;  Laterality: N/A;   CORONARY STENT PLACEMENT  2000   hair restoration  02/2017   LEFT HEART CATH AND CORONARY ANGIOGRAPHY N/A 06/14/2018   Procedure: LEFT HEART CATH AND CORONARY ANGIOGRAPHY;  Surgeon: Corky Crafts, MD;  Location: [facility] INVASIVE CV LAB;  Service: Cardiovascular;  Laterality: N/A;   LEFT HEART  CATH AND CORONARY ANGIOGRAPHY N/A 05/14/2019   Procedure: LEFT HEART CATH AND CORONARY ANGIOGRAPHY;  Surgeon: Tonny Bollman, MD;  Location: [facility] INVASIVE CV LAB;  Service: Cardiovascular;  Laterality: N/A;   LEFT HEART CATH AND CORONARY ANGIOGRAPHY N/A 01/23/2023   Procedure: LEFT HEART CATH AND CORONARY ANGIOGRAPHY;  Surgeon: Corky Crafts, MD;  Location: [facility] INVASIVE CV LAB;  Service: Cardiovascular;  Laterality: N/A;   LEFT HEART CATHETERIZATION WITH CORONARY ANGIOGRAM N/A 08/14/2012   Procedure: LEFT HEART CATHETERIZATION WITH CORONARY ANGIOGRAM;  Surgeon: Tonny Bollman, MD;  Location: [facility] CATH LAB;  Service: Cardiovascular;  Laterality: N/A;   LEFT HEART CATHETERIZATION WITH CORONARY ANGIOGRAM N/A 08/19/2012   Procedure: LEFT HEART CATHETERIZATION WITH CORONARY ANGIOGRAM;   Surgeon: Tonny Bollman, MD;  Location: [facility] CATH LAB;  Service: Cardiovascular;  Laterality: N/A;   ROTATOR CUFF REPAIR     TONSILLECTOMY     TOTAL ABDOMINAL HYSTERECTOMY       Current Outpatient Medications  Medication Sig Dispense Refill   acetaminophen (TYLENOL) 500 MG tablet Take 500 mg by mouth daily as needed for moderate pain, fever or headache.     Alirocumab (PRALUENT) 75 MG/ML SOAJ ADMINISTER 1 ML UNDER THE SKIN EVERY 14 DAYS 6 mL 1   apixaban (ELIQUIS) 5 MG TABS tablet TAKE 1 TABLET(5 MG) BY MOUTH TWICE DAILY 180 tablet 1   Bempedoic Acid (NEXLETOL) 180 MG TABS TAKE 1 TABLET BY MOUTH DAILY 90 tablet 3   Cholecalciferol (VITAMIN D3) 50 MCG (2000 UT) capsule Take 1 capsule (2,000 Units total) by mouth daily.     clonazePAM (KLONOPIN) 0.5 MG tablet Take 1 tablet (0.5 mg total) by mouth 2 (two) times daily as needed for anxiety. 20 tablet 1   clopidogrel (PLAVIX) 75 MG tablet Take 2 tablets (150 mg total) by mouth on day one only, then decrease to taking 1 tablet (75 mg total) by mouth daily thereafter. 92 tablet 1   doxycycline (MONODOX) 50 MG capsule Take 50 mg by mouth every Monday, Wednesday, and Friday.     fluticasone (FLONASE) 50 MCG/ACT nasal spray Place 2 sprays into both nostrils daily as needed for allergies or rhinitis. 48 g 1   losartan (COZAAR) 25 MG tablet Take 2 tablets (50 mg total) by mouth daily. 180 tablet 3   Multiple Vitamins-Minerals (MULTIVITAMIN WOMEN 50+) TABS Take 1 tablet by mouth daily.     nitroGLYCERIN (NITROSTAT) 0.4 MG SL tablet Place 1 tablet (0.4 mg total) under the tongue every 5 (five) minutes as needed for chest pain. 25 tablet 3   pantoprazole (PROTONIX) 40 MG tablet Take 1 tablet (40 mg total) by mouth daily. 90 tablet 1   potassium chloride (KLOR-CON) 10 MEQ tablet TAKE 1 TABLET(10 MEQ) BY MOUTH DAILY 90 tablet 3   Propylene Glycol (SYSTANE COMPLETE) 0.6 % SOLN Place 1 drop into both eyes 3 (three) times daily.     sertraline (ZOLOFT) 50 MG  tablet Take 1 tablet (50 mg total) by mouth daily. 30 tablet 3   No current facility-administered medications for this visit.    Allergies:   Effexor [venlafaxine], Flomax [tamsulosin], Macrobid [nitrofurantoin], Other, B-12 compliance injection [cyanocobalamin], Imdur [isosorbide nitrate], Statins, and Zetia [ezetimibe]    Social History:  The patient  reports that she has never smoked. She has never used smokeless tobacco. She reports current alcohol use of about 1.0 standard drink of alcohol per week. She reports that she does not use drugs.   Family History:  The patient's family  history includes Alzheimer's disease in her mother; Breast cancer in her maternal aunt; Heart disease in her father.    ROS: Noted in current history.  All other systems are negative.     Physical Exam: Blood pressure 136/74, pulse 65, height 5\' 1"  (1.549 m), weight 131 lb 6.4 oz (59.6 kg), SpO2 95%.       GEN:  Well nourished, well developed in no acute distress HEENT: Normal NECK: No JVD; No carotid bruits LYMPHATICS: No lymphadenopathy CARDIAC: RRR , no murmurs, rubs, gallops RESPIRATORY:  Clear to auscultation without rales, wheezing or rhonchi  ABDOMEN: Soft, non-tender, non-distended MUSCULOSKELETAL:  No edema; No deformity  SKIN: Warm and dry NEUROLOGIC:  Alert and oriented x 3      EKG:   .       Recent Labs: 01/22/2023: Magnesium 1.8 06/07/2023: ALT 18; BUN 16; Creatinine, Ser 0.96; Hemoglobin 13.4; Platelets 270; Potassium 4.8; Sodium 133 06/15/2023: TSH 3.76    Lipid Panel    Component Value Date/Time   CHOL 150 01/23/2023 0431   CHOL 176 08/02/2022 1013   TRIG 77 01/23/2023 0431   HDL 52 01/23/2023 0431   HDL 49 08/02/2022 1013   CHOLHDL 2.9 01/23/2023 0431   VLDL 15 01/23/2023 0431   LDLCALC 83 01/23/2023 0431   LDLCALC 94 08/02/2022 1013   LDLDIRECT 111.0 04/28/2019 1138      Wt Readings from Last 3 Encounters:  07/31/23 131 lb 6.4 oz (59.6 kg)  06/15/23 131 lb  4 oz (59.5 kg)  06/07/23 130 lb (59 kg)      Other studies Reviewed: Additional studies/ records that were reviewed today include: . Review of the above records demonstrates:    ASSESSMENT AND PLAN:  1. Coronary artery disease-  She is not having any episodes of angina.  Continue current medications    2. Intermittent atrial fibrillation -  Remains in sinus rhythm.      3.  hyperlipidemia-she remains on Praluent and bempedoic acid.         4. Essential HTN:     Blood pressure is well-controlled.  Continue current medications.   Current medicines are reviewed at length with the patient today.  The patient does not have concerns regarding medicines.  The following changes have been made:  no change  Labs/ tests ordered today include:   No orders of the defined types were placed in this encounter.     Signed, Kristeen Miss, MD  07/31/2023 4:11 PM    [facility] [address] [place], [place], [place]  [address] Phone: [phone]; Fax: [phone]

## 2023-07-31 ENCOUNTER — Ambulatory Visit: Payer: Medicare Other | Attending: Cardiovascular Disease | Admitting: Cardiovascular Disease

## 2023-07-31 ENCOUNTER — Encounter: Payer: Self-pay | Admitting: Cardiovascular Disease

## 2023-07-31 VITALS — BP 136/74 | HR 65 | Ht 61.0 in | Wt 131.4 lb

## 2023-07-31 DIAGNOSIS — I2510 Atherosclerotic heart disease of native coronary artery without angina pectoris: Secondary | ICD-10-CM | POA: Diagnosis not present

## 2023-07-31 DIAGNOSIS — I480 Paroxysmal atrial fibrillation: Secondary | ICD-10-CM | POA: Diagnosis not present

## 2023-07-31 NOTE — Patient Instructions (Signed)
 Follow-Up: At [facility], you and your health needs are our priority.  As part of our continuing mission to provide you with exceptional heart care, we have created designated Provider Care Teams.  These Care Teams include your primary Cardiologist (physician) and Advanced Practice Providers (APPs -  Physician Assistants and Nurse Practitioners) who all work together to provide you with the care you need, when you need it.  Your next appointment:   6 month(s)  Provider:   Minette Headland, MD     1st Floor: - Lobby - Registration  - Pharmacy  - Lab - Cafe  2nd Floor: - PV Lab - Diagnostic Testing (echo, CT, nuclear med)  3rd Floor: - Vacant  4th Floor: - TCTS (cardiothoracic surgery) - AFib Clinic - Structural Heart Clinic - Vascular Surgery  - Vascular Ultrasound  5th Floor: - HeartCare Cardiology (general and EP) - Clinical Pharmacy for coumadin, hypertension, lipid, weight-loss medications, and med management appointments    Valet parking services will be available as well.

## 2023-08-03 ENCOUNTER — Other Ambulatory Visit: Payer: Self-pay | Admitting: Cardiovascular Disease

## 2023-08-12 ENCOUNTER — Other Ambulatory Visit: Payer: Self-pay | Admitting: Cardiovascular Disease

## 2023-08-12 DIAGNOSIS — I25119 Atherosclerotic heart disease of native coronary artery with unspecified angina pectoris: Secondary | ICD-10-CM

## 2023-08-12 DIAGNOSIS — E7800 Pure hypercholesterolemia, unspecified: Secondary | ICD-10-CM

## 2023-08-12 DIAGNOSIS — I480 Paroxysmal atrial fibrillation: Secondary | ICD-10-CM

## 2023-08-12 DIAGNOSIS — Z79899 Other long term (current) drug therapy: Secondary | ICD-10-CM

## 2023-08-20 ENCOUNTER — Ambulatory Visit: Payer: Medicare Other | Admitting: Neurology

## 2023-09-18 DIAGNOSIS — Z1231 Encounter for screening mammogram for malignant neoplasm of breast: Secondary | ICD-10-CM | POA: Diagnosis not present

## 2023-09-23 ENCOUNTER — Other Ambulatory Visit: Payer: Self-pay | Admitting: Cardiovascular Disease

## 2023-09-23 DIAGNOSIS — E7800 Pure hypercholesterolemia, unspecified: Secondary | ICD-10-CM

## 2023-09-23 DIAGNOSIS — I25119 Atherosclerotic heart disease of native coronary artery with unspecified angina pectoris: Secondary | ICD-10-CM

## 2023-09-24 ENCOUNTER — Telehealth: Payer: Self-pay

## 2023-09-24 NOTE — Telephone Encounter (Signed)
 Order placed in folder at nurse station

## 2023-09-25 DIAGNOSIS — R92321 Mammographic fibroglandular density, right breast: Secondary | ICD-10-CM | POA: Diagnosis not present

## 2023-09-25 DIAGNOSIS — N6313 Unspecified lump in the right breast, lower outer quadrant: Secondary | ICD-10-CM | POA: Diagnosis not present

## 2023-09-27 NOTE — Telephone Encounter (Signed)
 Faxed and placed in scan bin

## 2023-09-27 NOTE — Telephone Encounter (Signed)
 Forms signed and returned to [place]

## 2023-10-03 ENCOUNTER — Other Ambulatory Visit: Payer: Self-pay | Admitting: Cardiovascular Disease

## 2023-10-08 ENCOUNTER — Other Ambulatory Visit: Payer: Self-pay

## 2023-10-08 MED ORDER — SERTRALINE HCL 50 MG PO TABS: 50.0000 mg | ORAL_TABLET | Freq: Every day | ORAL | 3 refills | Status: DC

## 2023-10-10 ENCOUNTER — Ambulatory Visit: Admitting: Family Medicine

## 2023-10-10 ENCOUNTER — Encounter: Payer: Self-pay | Admitting: Family Medicine

## 2023-10-10 VITALS — BP 104/64 | HR 89 | Temp 98.0°F | Ht 61.0 in | Wt 125.2 lb

## 2023-10-10 DIAGNOSIS — F419 Anxiety disorder, unspecified: Secondary | ICD-10-CM

## 2023-10-10 DIAGNOSIS — T887XXA Unspecified adverse effect of drug or medicament, initial encounter: Secondary | ICD-10-CM | POA: Diagnosis not present

## 2023-10-10 MED ORDER — BUSPIRONE HCL 10 MG PO TABS: 10.0000 mg | ORAL_TABLET | Freq: Two times a day (BID) | ORAL | 3 refills | Status: DC

## 2023-10-10 NOTE — Patient Instructions (Signed)
 Follow up in 4-6 weeks to recheck anxiety STOP the Sertraline  START the Buspar twice daily Continue to monitor your blood pressure periodically- it looks great today!!! Call with any questions or concerns Stay Safe!  Stay Healthy! Enjoy your trip!!!

## 2023-10-10 NOTE — Progress Notes (Signed)
   Subjective:    Patient ID: Shannon Cummings, female    DOB: 1945/06/23, 78 y.o.   MRN: [id number]  HPI Medication side effect- pt reports that her Zoloft  50mg  was working 'fabulously' but then noticed her legs would 'go crazy at night'.  Then noted that BP was elevated- 150s/100s.  Had palpitations.  Decreased the sertraline  from 50 --> 25mg  daily.  Stopped medication entirely 2 days ago.  Pt reports she feels much better today than she has the last few days but notes her anxiety is returning.     Review of Systems For ROS see HPI     Objective:   Physical Exam Vitals reviewed.  Constitutional:      General: She is not in acute distress.    Appearance: Normal appearance. She is not ill-appearing.  HENT:     Head: Normocephalic and atraumatic.  Eyes:     Extraocular Movements: Extraocular movements intact.     Conjunctiva/sclera: Conjunctivae normal.  Cardiovascular:     Rate and Rhythm: Normal rate and regular rhythm.  Pulmonary:     Effort: Pulmonary effort is normal. No respiratory distress.  Skin:    General: Skin is warm and dry.  Neurological:     General: No focal deficit present.     Mental Status: She is alert and oriented to person, place, and time.  Psychiatric:        Mood and Affect: Mood normal.        Behavior: Behavior normal.        Thought Content: Thought content normal.           Assessment & Plan:

## 2023-11-03 NOTE — Assessment & Plan Note (Signed)
 Deteriorated.  Pt reports Sertraline  was working very well but started having elevated BP, RLS, and palpitations.  She weaned herself off medication but anxiety is returning.  Would like to treat the anxiety but is worried about side effects.  Will start Buspar  BID and follow closely.  Pt expressed understanding and is in agreement w/ plan.

## 2023-11-09 ENCOUNTER — Other Ambulatory Visit: Payer: Self-pay | Admitting: *Deleted

## 2023-11-09 DIAGNOSIS — I480 Paroxysmal atrial fibrillation: Secondary | ICD-10-CM

## 2023-11-09 MED ORDER — APIXABAN 5 MG PO TABS: ORAL_TABLET | ORAL | 1 refills | Status: DC

## 2023-11-09 NOTE — Telephone Encounter (Signed)
 Eliquis  5mg  refill request received. Patient is 78 years old, weight-56.8kg, Crea-0.96 on 06/07/23, Diagnosis-Afib, and last seen by Dr. Alroy Aspen on 07/31/23. Dose is appropriate based on dosing criteria. Will send in refill to requested pharmacy.

## 2023-11-19 ENCOUNTER — Telehealth: Payer: Self-pay | Admitting: Cardiovascular Disease

## 2023-11-19 NOTE — Telephone Encounter (Signed)
 Patient c/o Palpitations: STAT if patient c/o lightheadedness, shortness of breath, or chest pain  How long have you had palpitations/irregular HR/ Afib? Are you having the symptoms now?  Palpitations on and off for about 1 month. No symptoms currently.  Are you currently experiencing lightheadedness, SOB or CP?  No   Do you have a history of afib (atrial fibrillation) or irregular heart rhythm?  Yes, hx afib + ablation  Have you checked your BP or HR? (document readings if available):  Systolic in the 120's, 110's. She says it isn't usually that low, so she wonders if it's causing the dizziness she's been having. HR is normally in the 50's but it's been in the 60-70's recently.  Are you experiencing any other symptoms?  SOB, Weakness, dizziness mainly when standing for the past months

## 2023-11-19 NOTE — Telephone Encounter (Signed)
 Spoke with pt, she is new to dr Chancy Comber but wanted me to send this message to her. She continues to have dizziness when standing from sitting. Her blood pressure will be 110-118-124/70-80 and she will not take the losartan . Then her blood pressure will be 135-150 but she will still have dizziness. She feels the plavix  maybe causing her problem. She reports she felt fine until that medication was started. She would like to know what dr Chancy Comber thinks. Aware since she has not seen acharya, she may not be able to answer her questions until see. No openings at this time. Will forward to dr Chancy Comber.

## 2023-11-20 NOTE — Telephone Encounter (Signed)
 Appointment scheduled with provider. Patient is aware

## 2023-11-21 ENCOUNTER — Encounter: Payer: Self-pay | Admitting: Family Medicine

## 2023-11-21 ENCOUNTER — Ambulatory Visit (INDEPENDENT_AMBULATORY_CARE_PROVIDER_SITE_OTHER): Admitting: Family Medicine

## 2023-11-21 VITALS — BP 102/72 | HR 68 | Temp 98.0°F | Ht 61.0 in | Wt 124.4 lb

## 2023-11-21 DIAGNOSIS — F419 Anxiety disorder, unspecified: Secondary | ICD-10-CM

## 2023-11-21 DIAGNOSIS — E7800 Pure hypercholesterolemia, unspecified: Secondary | ICD-10-CM

## 2023-11-21 LAB — HEPATIC FUNCTION PANEL
ALT: 15 U/L (ref 0–35)
AST: 25 U/L (ref 0–37)
Albumin: 4.7 g/dL (ref 3.5–5.2)
Alkaline Phosphatase: 56 U/L (ref 39–117)
Bilirubin, Direct: 0.1 mg/dL (ref 0.0–0.3)
Total Bilirubin: 0.7 mg/dL (ref 0.2–1.2)
Total Protein: 7.4 g/dL (ref 6.0–8.3)

## 2023-11-21 LAB — CBC WITH DIFFERENTIAL/PLATELET
Basophils Absolute: 0 10*3/uL (ref 0.0–0.1)
Basophils Relative: 0.3 % (ref 0.0–3.0)
Eosinophils Absolute: 0.1 10*3/uL (ref 0.0–0.7)
Eosinophils Relative: 1.2 % (ref 0.0–5.0)
HCT: 41.2 % (ref 36.0–46.0)
Hemoglobin: 13.8 g/dL (ref 12.0–15.0)
Lymphocytes Relative: 30.2 % (ref 12.0–46.0)
Lymphs Abs: 1.8 10*3/uL (ref 0.7–4.0)
MCHC: 33.5 g/dL (ref 30.0–36.0)
MCV: 87.4 fl (ref 78.0–100.0)
Monocytes Absolute: 0.4 10*3/uL (ref 0.1–1.0)
Monocytes Relative: 6.6 % (ref 3.0–12.0)
Neutro Abs: 3.7 10*3/uL (ref 1.4–7.7)
Neutrophils Relative %: 61.7 % (ref 43.0–77.0)
Platelets: 205 10*3/uL (ref 150.0–400.0)
RBC: 4.71 Mil/uL (ref 3.87–5.11)
RDW: 14.2 % (ref 11.5–15.5)
WBC: 6 10*3/uL (ref 4.0–10.5)

## 2023-11-21 LAB — LIPID PANEL
Cholesterol: 171 mg/dL (ref 0–200)
HDL: 57.2 mg/dL (ref >39.00)
LDL Cholesterol: 76 mg/dL (ref 0–99)
NonHDL: 113.78
Total CHOL/HDL Ratio: 3
Triglycerides: 187 mg/dL — ABNORMAL HIGH (ref 0.0–149.0)
VLDL: 37.4 mg/dL (ref 0.0–40.0)

## 2023-11-21 LAB — TSH: TSH: 3.84 u[IU]/mL (ref 0.35–5.50)

## 2023-11-21 LAB — BASIC METABOLIC PANEL WITH GFR
BUN: 16 mg/dL (ref 6–23)
CO2: 30 meq/L (ref 19–32)
Calcium: 9.4 mg/dL (ref 8.4–10.5)
Chloride: 96 meq/L (ref 96–112)
Creatinine, Ser: 0.82 mg/dL (ref 0.40–1.20)
GFR: 69.01 mL/min (ref >60.00)
Glucose, Bld: 75 mg/dL (ref 70–99)
Potassium: 4 meq/L (ref 3.5–5.1)
Sodium: 133 meq/L — ABNORMAL LOW (ref 135–145)

## 2023-11-21 MED ORDER — SERTRALINE HCL 25 MG PO TABS: 25.0000 mg | ORAL_TABLET | Freq: Every day | ORAL | 3 refills | Status: DC

## 2023-11-21 NOTE — Assessment & Plan Note (Signed)
 Chronic problem.  She sees cardiology next week and wants to have results available to discuss.  She is hoping she doesn't need to be on both the Praluent  and Nexletol  since she has lost weight and continues to work on diet and exercise.  After her discussion w/ cardiology, she would like me to assume the prescribing role for her cholesterol meds.

## 2023-11-21 NOTE — Patient Instructions (Addendum)
 Follow up in 6 weeks to recheck anxiety We'll notify you of your lab results and make any changes if needed RESTART the Sertraline  25mg  daily (the lower dose) Call with any questions or concerns Stay Safe!  Stay Healthy! Hang in there!!!

## 2023-11-21 NOTE — Progress Notes (Signed)
   Subjective:    Patient ID: Shannon Cummings, female    DOB: 12-04-1945, 78 y.o.   MRN: [id number]  HPI Anxiety- at last visit we started Buspar  for anxiety.  She stopped the Sertraline  prior to this b/c she was having RLS.  She stopped the Buspar  b/c she was having increased dizziness- pt is not sure if this was related to Buspar  or her ongoing BP and blood thinner use.    Hyperlipidemia- chronic problem.  On Nexletol  180mg  daily and Praluent  75mg  q14.  She is curious if she needs to be on both meds due to recent weight loss.   Review of Systems For ROS see HPI     Objective:   Physical Exam Vitals reviewed.  Constitutional:      General: She is not in acute distress.    Appearance: Normal appearance. She is not ill-appearing.  HENT:     Head: Normocephalic and atraumatic.   Eyes:     Extraocular Movements: Extraocular movements intact.     Conjunctiva/sclera: Conjunctivae normal.    Cardiovascular:     Rate and Rhythm: Normal rate and regular rhythm.  Pulmonary:     Effort: Pulmonary effort is normal. No respiratory distress.     Breath sounds: No wheezing.   Musculoskeletal:     Cervical back: Neck supple.   Skin:    General: Skin is warm and dry.   Neurological:     General: No focal deficit present.     Mental Status: She is alert and oriented to person, place, and time.   Psychiatric:        Mood and Affect: Mood normal.        Behavior: Behavior normal.        Thought Content: Thought content normal.           Assessment & Plan:

## 2023-11-21 NOTE — Assessment & Plan Note (Signed)
 Deteriorated.  Pt was doing well on Sertraline  but this triggered her RLS.  We started Buspar  at last visit but pt didn't feel that it was impacting mood and she felt dizzy so she stopped it.  Feels that dizziness may be related to taking Plavix  and plans to talk to Cardiology next week about whether she needs to continue medication.  Will restart Sertraline  but at a lower dose and see if she is able to tolerate w/o triggering RLS sxs.  Pt expressed understanding and is in agreement w/ plan.

## 2023-11-22 ENCOUNTER — Ambulatory Visit: Payer: Self-pay | Admitting: Family Medicine

## 2023-11-22 NOTE — Progress Notes (Unsigned)
 Cardiology Office Note   Date:  11/22/2023   ID:  Betsaida, [place] 1946-06-01, MRN [id number]  PCP:  Jess Morita, MD  Cardiologist:   Ahmad Alert, MD   Chief Complaint  Patient presents with   Coronary Artery Disease        Atrial Fibrillation   Hypertension        1. Coronary artery disease-status post stenting in the past.  She status post stenting of her proximal LAD  08/14/2012 2. Intermittent atrial fibrillation 3.  hyperlipidemia-she has been generally intolerant to most statins   Pt is doing well.  No cardiac complaints.     She complains of some tingling and numbness associated with the Crestor .  She has discontinued the Crestor  because of that reason.  She has had reactions to most statins that we have tried.  October 08-2011  She has done well.  She has not had any angina or eposides of atrial fib.  She has retired and is traveling quite a bit.  Her husband has also retired.  She has not had any problems.  October 01, 2012:  She is having some bruising ( due to Brilinta ).  She is enjoying the cardiac rehab.  She has noticed some low BP readings at the end of cardiac rehab.  She does not feel bad.  These low readings are typically asymptomatic.    No angina.  No arrhythmias.   August 20, 2013:  Shannon Cummings is doing ok.  She was admitted to the hospital with some CP recently.  Rule out for MI.    She has been doing Ok.  Has gained a bit of weight.   She is not able to exercise because  Of left ankle problems.    August 19, 2014:   Shannon Cummings is a 78 y.o. female who presents for follow up of her atrial fib. Has gained some weight.  She is in the study for lipid management Has had leg cramps since last fall. Off and on  Has had more paroxysmal atrial fib.  Lasts about a minute. Seem to be occuring more frequently.   Jan. 18, 2017:  Shannon Cummings presents for follow up of her CAD and atrial fib.  Doing well.   Has visited [place]  recently .   No  CP or dyspnea.   Tolerates the Rythmol .  Talked about her daughter who lives in [place].  Is not having any significant atrial fib issues.   Has been on lipid lowering study drug at [facility]  ( injectable cholesterol medication )   Jan.   25, 2018:  Doing well.   Feeling well Has had a stomach bug last week and now has a URI  - not the flu.   Has maintained NSR .  Is having hair loss.  Her research shows that amlodipine  and atenolol  can cause this   Aug. 23, 2018:  Shannon Cummings is seen today for eval of some elevated BP Has been having pain in her back, tightness in her chest  Went to ER.  Has had elevated BP for a week,   Went back to ER  Doubled her metoprolol  XL .  Saw Marlyse Single on Aug. 6. 2018 Was given Imdur  - developed a headache that lasted 3 days  Lexiscan  myoview  was low risk.   Her back pain / chest tightness has resolved.   May 08, 2017: Shannon Cummings seen today for follow-up of her hypertension, coronary artery disease, paroxysmal atrial fibrillation. Is  feeling well  Is taking the Toprol  XL just once a day - not BID as listed in MAR .   August 15, 2017   Shannon Cummings is doing well BP is much better.  Is exercising and watching her diet.   Is on a study drug for cholesterol   June 18, 2018: He was admitted to the hospital recently with a non-ST segment elevation myocardial infarction. The previously placed LAD stent was widely patent.  She had a tight stenosis in her first obtuse marginal artery that was stented. Function is mildly reduced with an EF of 45 to 50%.  She returned to the ER the day after she went home with an episode of rapid AF and CP .    Troponin was  0.18  Her rhythmol has been stopped . Saw Tera Fellows and was started on Amiodarone  . The plan is for short term Amio and then consider AF ablation . They also discussed Tikosyn .  She tolerates her AF poorly.  We discussed taking short acting beta blockers if she has AF with CP  Had been  walking ,   Has been weak since the MI.    Feb. 26, 2020  Shannon Cummings is seen for follow up of her CAD and PAF Has had some shortness of breath . BP is a bit elevated today  amio has been reduced to 200 mg a day  Has an appt with Dr. Nunzio Belch on March 23 to discuss ablation  Is in a lipid study   Nov. 22, 2021: Shannon Cummings is seen for follow up of her CAD, PAF . She had a NSTEMI in Dec.   Propafaone was stopped after that hospitalization.  Was started on Amiodarone   Doing well from a cardiac standpoint.  Is on lots abx.  Has not been exercising .    Will check lipids, liver enz, bmp today  Is being treated for C.diff colitis   Jan. 9, 2023 Shannon Cummings is seen for follow up of her CAD, PAF She had previously been on profafanone until a NSTEMI several years ago.   Was started on amio.   We referred her for AF ablation given the side effects of amio.   Was seen by Allred.  She has had an atrial fib ablation since I last saw her. Continues with Eliquis .   No cp, Not as much exercise as she should be getting  Has lost some weight  - wt is 133 lbs    Feb. 28, 2024 Shannon Cummings is seen for follow up of her CAD, PAF.  S/p afib ablation She was seen in the hospital on June 02, 2022 with episodes of chest pain.  Felt like she was going to pass out .  She had taken 2 nitroglycerin  without any relief. Troponins x 2 were normal (3, 4).  No further episodes of CP .   She is going back to [place] this spring .  Going to [place]  She has been on Praluent  and bempedoic acid .  Despite our best efforts her LDL is still 96.  She started about 200.   Feb 01, 2023  Shannon Cummings is seen for follow up of her CAD, PAF, afib ablation Her husband had a stroke at the end of July, Had carotid surgery   She had angina , was admitted to [facility]  Had a very tough time while in the ER ,  Is very dissatisfied with her care  - trouble with her nurse call button.  She called  the operator from her ER  room Called a total of  5 times to the operator   Troponins were 1995, 1771  Cath showed her CAD as stable.  Troponin elevation was thought to be due to spasm  Mild anterolateral hypokinesis  Echo shows normal LV EF  60-65%  RV function is mildly reduced  Has not taken her Losartan  in weeks .  Has been DC'd.  Was started on metoprolol  following hospitalization   No further episodes of CP  Is having orthostatic hypotension   Nov. 18, 2024 Shannon Cummings is seen for follow up of her CAD Had a NSTEMI in July  Cath revealed stable,  CP/ troponin elevation was thought to be due to spasm   Still upset about her care during a hospitalization in June  BP has been variable  - up and down  Has been taking Losartan  25-50 mg if her BP runs high Avoids salt for the most part  Only moderate ETOH intake  We discussed my retirement in June. I will have her follow up with Dr. Chancy Comber   Feb. 25, 2025  Shannon Cummings is seen for follow up of her HTN, orthostatic hypotension BP has been labile recently  She was seen in the ER for palpitations on Jan. 2, 2025 ECG showed NSR   BP was very elevated a month ago   She knows that she is overly stressed  Saw Dr. Paulla Bossier  - was started on Zoloft  25,  was increased to 50   BP has been well controlled .  No CP   November 26, 2023 Shannon Cummings is seen for follow up visit   Hx of HTN, orthostatic hypotension, CAD , atrial fib    Past Medical History:  Diagnosis Date   Arthritis    CAD (coronary artery disease)    a. prior stenting history. b. NSTEMI s/p DES to prox LAD with EF 45% by cath 08/14/12. c. Mild trop elevation shortly after cath ?mild plaque embolization - patent stent on relook. // d. Myoview  8/18: EF 60, normal perfusion; Low Risk. e. NSTEMI 06/2018- patent prior LAD stent, 80% OM1 s/p DES, normal LVEDP, EF 45-50%, moderate residual disease treated medically.   Fatty infiltration of liver    GERD (gastroesophageal reflux disease)    Hyperlipidemia    Hypertension     Hypokalemia    Hypotension    Myocardial infarction (HCC) 2014   OSA on CPAP    Osteoporosis    PAF (paroxysmal atrial fibrillation) (HCC)    On rythmol  previously   Pulmonary nodule    a. 8mm subpleural nodular density CT 08/2012, instructed to f/u pulm MD.   Sinus bradycardia    Tubular adenoma of colon 2007    Past Surgical History:  Procedure Laterality Date   ATRIAL FIBRILLATION ABLATION N/A 07/27/2020   Procedure: ATRIAL FIBRILLATION ABLATION;  Surgeon: Jolly Needle, MD;  Location: [facility] INVASIVE CV LAB;  Service: Cardiovascular;  Laterality: N/A;   COLONOSCOPY  03/07/2016   COLONOSCOPY WITH PROPOFOL   07/04/2021   CORONARY ANGIOPLASTY WITH STENT PLACEMENT  08/14/2012   LAD  Dr Arnoldo Lapping   CORONARY STENT INTERVENTION N/A 06/14/2018   Procedure: CORONARY STENT INTERVENTION;  Surgeon: Lucendia Rusk, MD;  Location: [facility] INVASIVE CV LAB;  Service: Cardiovascular;  Laterality: N/A;   CORONARY STENT PLACEMENT  2000   hair restoration  02/2017   LEFT HEART CATH AND CORONARY ANGIOGRAPHY N/A 06/14/2018   Procedure: LEFT HEART CATH AND CORONARY ANGIOGRAPHY;  Surgeon: Jacquelynn Matter,  Anner Kill, MD;  Location: [facility] INVASIVE CV LAB;  Service: Cardiovascular;  Laterality: N/A;   LEFT HEART CATH AND CORONARY ANGIOGRAPHY N/A 05/14/2019   Procedure: LEFT HEART CATH AND CORONARY ANGIOGRAPHY;  Surgeon: Arnoldo Lapping, MD;  Location: [facility] INVASIVE CV LAB;  Service: Cardiovascular;  Laterality: N/A;   LEFT HEART CATH AND CORONARY ANGIOGRAPHY N/A 01/23/2023   Procedure: LEFT HEART CATH AND CORONARY ANGIOGRAPHY;  Surgeon: Lucendia Rusk, MD;  Location: [facility] INVASIVE CV LAB;  Service: Cardiovascular;  Laterality: N/A;   LEFT HEART CATHETERIZATION WITH CORONARY ANGIOGRAM N/A 08/14/2012   Procedure: LEFT HEART CATHETERIZATION WITH CORONARY ANGIOGRAM;  Surgeon: Arnoldo Lapping, MD;  Location[facility] CATH LAB;  Service: Cardiovascular;  Laterality: N/A;   LEFT HEART CATHETERIZATION WITH CORONARY ANGIOGRAM N/A  08/19/2012   Procedure: LEFT HEART CATHETERIZATION WITH CORONARY ANGIOGRAM;  Surgeon: Arnoldo Lapping, MD;  Location: [facility] CATH LAB;  Service: Cardiovascular;  Laterality: N/A;   ROTATOR CUFF REPAIR     TONSILLECTOMY     TOTAL ABDOMINAL HYSTERECTOMY       Current Outpatient Medications  Medication Sig Dispense Refill   acetaminophen  (TYLENOL ) 500 MG tablet Take 500 mg by mouth daily as needed for moderate pain, fever or headache.     Alirocumab  (PRALUENT ) 75 MG/ML SOAJ ADMINISTER 1 ML UNDER THE SKIN EVERY 14 DAYS 6 mL 3   apixaban  (ELIQUIS ) 5 MG TABS tablet TAKE 1 TABLET(5 MG) BY MOUTH TWICE DAILY 180 tablet 1   Cholecalciferol  (VITAMIN D3) 50 MCG (2000 UT) capsule Take 1 capsule (2,000 Units total) by mouth daily.     clopidogrel  (PLAVIX ) 75 MG tablet Take 1 tablet (75 mg total) by mouth daily. 90 tablet 3   doxycycline  (MONODOX ) 50 MG capsule Take 50 mg by mouth every Monday, Wednesday, and Friday.     fluticasone  (FLONASE ) 50 MCG/ACT nasal spray Place 2 sprays into both nostrils daily as needed for allergies or rhinitis. 48 g 1   losartan  (COZAAR ) 25 MG tablet Take 2 tablets (50 mg total) by mouth daily. 180 tablet 3   Multiple Vitamins-Minerals (MULTIVITAMIN WOMEN 50+) TABS Take 1 tablet by mouth daily.     NEXLETOL  180 MG TABS TAKE 1 TABLET BY MOUTH DAILY 90 tablet 3   nitroGLYCERIN  (NITROSTAT ) 0.4 MG SL tablet Place 1 tablet (0.4 mg total) under the tongue every 5 (five) minutes as needed for chest pain. 25 tablet 3   pantoprazole  (PROTONIX ) 40 MG tablet Take 1 tablet (40 mg total) by mouth daily. 90 tablet 1   potassium chloride  (KLOR-CON ) 10 MEQ tablet TAKE 1 TABLET(10 MEQ) BY MOUTH DAILY 90 tablet 3   Propylene Glycol (SYSTANE COMPLETE) 0.6 % SOLN Place 1 drop into both eyes 3 (three) times daily.     sertraline  (ZOLOFT ) 25 MG tablet Take 1 tablet (25 mg total) by mouth daily. 30 tablet 3   No current facility-administered medications for this visit.    Allergies:   Effexor   [venlafaxine ], Flomax [tamsulosin], Macrobid [nitrofurantoin], Other, B-12 compliance injection [cyanocobalamin ], Imdur  [isosorbide  nitrate], Statins, and Zetia  [ezetimibe ]    Social History:  The patient  reports that she has never smoked. She has never used smokeless tobacco. She reports current alcohol  use of about 1.0 standard drink of alcohol  per week. She reports that she does not use drugs.   Family History:  The patient's family history includes Alzheimer's disease in her mother; Breast cancer in her maternal aunt; Heart disease in her father.    ROS: Noted in current  history.  All other systems are negative.      Physical Exam: There were no vitals taken for this visit.       GEN:  Well nourished, well developed in no acute distress HEENT: Normal NECK: No JVD; No carotid bruits LYMPHATICS: No lymphadenopathy CARDIAC: RRR ***, no murmurs, rubs, gallops RESPIRATORY:  Clear to auscultation without rales, wheezing or rhonchi  ABDOMEN: Soft, non-tender, non-distended MUSCULOSKELETAL:  No edema; No deformity  SKIN: Warm and dry NEUROLOGIC:  Alert and oriented x 3      EKG:   .       Recent Labs: 01/22/2023: Magnesium  1.8 11/21/2023: ALT 15; BUN 16; Creatinine, Ser 0.82; Hemoglobin 13.8; Platelets 205.0; Potassium 4.0; Sodium 133; TSH 3.84    Lipid Panel    Component Value Date/Time   CHOL 171 11/21/2023 1127   CHOL 176 08/02/2022 1013   TRIG 187.0 (H) 11/21/2023 1127   HDL 57.20 11/21/2023 1127   HDL 49 08/02/2022 1013   CHOLHDL 3 11/21/2023 1127   VLDL 37.4 11/21/2023 1127   LDLCALC 76 11/21/2023 1127   LDLCALC 94 08/02/2022 1013   LDLDIRECT 111.0 04/28/2019 1138      Wt Readings from Last 3 Encounters:  11/21/23 124 lb 6 oz (56.4 kg)  10/10/23 125 lb 4 oz (56.8 kg)  07/31/23 131 lb 6.4 oz (59.6 kg)      Other studies Reviewed: Additional studies/ records that were reviewed today include: . Review of the above records demonstrates:     ASSESSMENT AND PLAN:  1. Coronary artery disease-       2. Intermittent atrial fibrillation -      3.  hyperlipidemia-she remains on Praluent  and bempedoic acid .         4. Essential HTN:        Current medicines are reviewed at length with the patient today.  The patient does not have concerns regarding medicines.  The following changes have been made:  no change  Labs/ tests ordered today include:   No orders of the defined types were placed in this encounter.     Signed, Ahmad Alert, MD  11/22/2023 7:14 PM    [facility] [address] [place], [place], [place]  [address] Phone: [phone]; Fax: [phone]

## 2023-11-26 ENCOUNTER — Ambulatory Visit: Attending: Cardiovascular Disease | Admitting: Cardiovascular Disease

## 2023-11-26 ENCOUNTER — Encounter: Payer: Self-pay | Admitting: Cardiovascular Disease

## 2023-11-26 ENCOUNTER — Ambulatory Visit

## 2023-11-26 VITALS — BP 138/84 | HR 63 | Ht 61.0 in | Wt 123.6 lb

## 2023-11-26 DIAGNOSIS — I480 Paroxysmal atrial fibrillation: Secondary | ICD-10-CM | POA: Insufficient documentation

## 2023-11-26 DIAGNOSIS — E7800 Pure hypercholesterolemia, unspecified: Secondary | ICD-10-CM | POA: Insufficient documentation

## 2023-11-26 DIAGNOSIS — I2510 Atherosclerotic heart disease of native coronary artery without angina pectoris: Secondary | ICD-10-CM

## 2023-11-26 DIAGNOSIS — Z79899 Other long term (current) drug therapy: Secondary | ICD-10-CM | POA: Insufficient documentation

## 2023-11-26 MED ORDER — FENOFIBRATE 134 MG PO CAPS: 134.0000 mg | ORAL_CAPSULE | Freq: Every day | ORAL | 3 refills | Status: AC

## 2023-11-26 NOTE — Progress Notes (Unsigned)
Enrolled patient for a 14 day ZIo XT monitor to be mailed to patients home  °

## 2023-11-26 NOTE — Patient Instructions (Signed)
 Medication Instructions:  START Fenofibrate 135 mg daily  *If you need a refill on your cardiac medications before your next appointment, please call your pharmacy*  Lab Work in 3 months: Lipids ALT BMP  If you have labs (blood work) drawn today and your tests are completely normal, you will receive your results only by: MyChart Message (if you have MyChart) OR A paper copy in the mail If you have any lab test that is abnormal or we need to change your treatment, we will call you to review the results.  Testing/Procedures: Zio 14 day heart monitor  Your physician has recommended that you wear an event monitor. Event monitors are medical devices that record the heart's electrical activity. Doctors most often us  these monitors to diagnose arrhythmias. Arrhythmias are problems with the speed or rhythm of the heartbeat. The monitor is a small, portable device. You can wear one while you do your normal daily activities. This is usually used to diagnose what is causing palpitations/syncope (passing out).   Follow-Up: At [facility], you and your health needs are our priority.  As part of our continuing mission to provide you with exceptional heart care, our providers are all part of one team.  This team includes your primary Cardiologist (physician) and Advanced Practice Providers or APPs (Physician Assistants and Nurse Practitioners) who all work together to provide you with the care you need, when you need it.  Your next appointment:   6 month(s)  Provider:   Selma Merck, MD  Other Instructions [employer] Lite salt ( 50% sodium, 50 % potassium )   ZIO XT- Long Term Monitor Instructions  Your physician has requested you wear a ZIO patch monitor for 14 days.  This is a single patch monitor. [employer] supplies one patch monitor per enrollment. Additional stickers are not available. Please do not apply patch if you will be having a Nuclear Stress Test,  Echocardiogram, Cardiac CT,  MRI, or Chest Xray during the period you would be wearing the  monitor. The patch cannot be worn during these tests. You cannot remove and re-apply the  ZIO XT patch monitor.  Your ZIO patch monitor will be mailed 3 day USPS to your address on file. It may take 3-5 days  to receive your monitor after you have been enrolled.  Once you have received your monitor, please review the enclosed instructions. Your monitor  has already been registered assigning a specific monitor serial # to you.  Billing and Patient Assistance Program Information  We have supplied [employer] with any of your insurance information on file for billing purposes. [employer] offers a sliding scale Patient Assistance Program for patients that do not have  insurance, or whose insurance does not completely cover the cost of the ZIO monitor.  You must apply for the Patient Assistance Program to qualify for this discounted rate.  To apply, please call [employer] at [phone], select option 4, select option 2, ask to apply for  Patient Assistance Program. Meredeth will ask your household income, and how many people  are in your household. They will quote your out-of-pocket cost based on that information.  [employer] will also be able to set up a 16-month, interest-free payment plan if needed.  Applying the monitor   Shave hair from upper left chest.  Hold abrader disc by orange tab. Rub abrader in 40 strokes over the upper left chest as  indicated in your monitor instructions.  Clean area with 4 enclosed alcohol  pads. Let dry.  Apply patch as indicated in monitor instructions. Patch will be placed under collarbone on left  side of chest with arrow pointing upward.  Rub patch adhesive wings for 2 minutes. Remove white label marked 1. Remove the white  label marked 2. Rub patch adhesive wings for 2 additional minutes.  While looking in a mirror, press and release button in center of patch. A small green light will  flash 3-4  times. This will be your only indicator that the monitor has been turned on.  Do not shower for the first 24 hours. You may shower after the first 24 hours.  Press the button if you feel a symptom. You will hear a small click. Record Date, Time and  Symptom in the Patient Logbook.  When you are ready to remove the patch, follow instructions on the last 2 pages of Patient  Logbook. Stick patch monitor onto the last page of Patient Logbook.  Place Patient Logbook in the blue and white box. Use locking tab on box and tape box closed  securely. The blue and white box has prepaid postage on it. Please place it in the mailbox as  soon as possible. Your physician should have your test results approximately 7 days after the  monitor has been mailed back to [facility].  Call [facility] Customer Care at [phone] if you have questions regarding  your ZIO XT patch monitor. Call them immediately if you see an orange light blinking on your  monitor.  If your monitor falls off in less than 4 days, contact our Monitor department at [phone].  If your monitor becomes loose or falls off after 4 days call [employer] at [phone] for  suggestions on securing your monitor

## 2023-12-03 DIAGNOSIS — L6611 Classic lichen planopilaris: Secondary | ICD-10-CM | POA: Diagnosis not present

## 2023-12-03 DIAGNOSIS — L649 Androgenic alopecia, unspecified: Secondary | ICD-10-CM | POA: Diagnosis not present

## 2023-12-03 DIAGNOSIS — L821 Other seborrheic keratosis: Secondary | ICD-10-CM | POA: Diagnosis not present

## 2023-12-03 DIAGNOSIS — L820 Inflamed seborrheic keratosis: Secondary | ICD-10-CM | POA: Diagnosis not present

## 2023-12-22 DIAGNOSIS — I2510 Atherosclerotic heart disease of native coronary artery without angina pectoris: Secondary | ICD-10-CM | POA: Diagnosis not present

## 2023-12-22 DIAGNOSIS — I480 Paroxysmal atrial fibrillation: Secondary | ICD-10-CM | POA: Diagnosis not present

## 2024-01-01 DIAGNOSIS — I480 Paroxysmal atrial fibrillation: Secondary | ICD-10-CM | POA: Diagnosis not present

## 2024-01-02 ENCOUNTER — Ambulatory Visit: Payer: Self-pay | Admitting: Internal Medicine

## 2024-01-10 ENCOUNTER — Other Ambulatory Visit: Payer: Self-pay

## 2024-01-10 MED ORDER — PANTOPRAZOLE SODIUM 40 MG PO TBEC: 40.0000 mg | DELAYED_RELEASE_TABLET | Freq: Every day | ORAL | 1 refills | Status: AC

## 2024-03-12 ENCOUNTER — Ambulatory Visit (INDEPENDENT_AMBULATORY_CARE_PROVIDER_SITE_OTHER): Admitting: Physician Assistant

## 2024-03-12 ENCOUNTER — Other Ambulatory Visit: Payer: Self-pay

## 2024-03-12 ENCOUNTER — Encounter: Payer: Self-pay | Admitting: Physician Assistant

## 2024-03-12 DIAGNOSIS — G8929 Other chronic pain: Secondary | ICD-10-CM

## 2024-03-12 DIAGNOSIS — M5442 Lumbago with sciatica, left side: Secondary | ICD-10-CM

## 2024-03-12 MED ORDER — METHYLPREDNISOLONE 4 MG PO TABS: ORAL_TABLET | ORAL | 0 refills | Status: AC

## 2024-03-12 NOTE — Progress Notes (Signed)
 Office Visit Note   Patient: Shannon Cummings           Date of Birth: 1946-05-28           MRN: [id number] Visit Date: 03/12/2024              Requested by: Mahlon Comer BRAVO, MD [id number] [place],  [place] [address] PCP: Mahlon Comer BRAVO, MD   Assessment & Plan: Visit Diagnoses:  1. Chronic left-sided low back pain with left-sided sciatica     Plan: Will send her to formal therapy at [facility] for core strengthening, back exercises, home exercise program, and modalities.  Placed on Medrol  Dosepak.  She will follow-up with us  in 6 weeks to check her progress.  If her symptoms worsen in any way she will follow-up with us  sooner.  Questions were encouraged and answered.  Follow-Up Instructions: No follow-ups on file.   Orders:  Orders Placed This Encounter  Procedures   XR Lumbar Spine 2-3 Views   Meds ordered this encounter  Medications   methylPREDNISolone  (MEDROL ) 4 MG tablet    Sig: Day 1 take 3 tablets in the morning 3 tablets in the evening, day 2 take 3 tablets in the morning 2 tablets in the evening, day 3 take 2 tablets in the morning 2 tablets in the evening day 4 take 2 tablets in the morning 1 tablet in the evening, day 5 take 1 tablet in the morning 1 tablet in the evening, day 6 take 1 tablet in the morning    Dispense:  21 tablet    Refill:  0      Procedures: No procedures performed   Clinical Data: No additional findings.   Subjective: Chief Complaint  Patient presents with   Lower Back - Pain    HPI Mrs. Shannon Cummings 78 year old female were seen for the first time for low back pain.  Pain radiates down her left hip stops for the knee.  Pain is mostly in the lateral aspect of the buttocks region.  Described as a throbbing achy sensation does awaken her at night.  No bowel or bladder dysfunction, no saddle anesthesia like symptoms no fevers or chills.  Does report weight changes and 20 pounds over the last 4 months she has changed her diet.   She is unable to take NSAIDs due to the pain she is on Eliquis  and Plavix .  She has been taking Tylenol  due to the back pain is really not helping.  She states she has had off and on back pain for years but is became worse over the last several months.  No known injury.  She does go to exercise class where they do step ups and stretching exercises.  Denies any groin pain.  Review of Systems See HPI  Objective: Vital Signs: There were no vitals taken for this visit.  Physical Exam Constitutional:      Appearance: She is normal weight. She is not ill-appearing or diaphoretic.  Cardiovascular:     Pulses: Normal pulses.  Pulmonary:     Effort: Pulmonary effort is normal.  Neurological:     Mental Status: She is alert and oriented to person, place, and time.  Psychiatric:        Mood and Affect: Mood normal.     Ortho Exam Bilateral hips: Excellent range of motion both hips without pain.  Tenderness over the left hip trochanteric region only. Lower extremity/lumbar spine: 5 out of 5 strength throughout  the lower extremities except for the left leg with 4 out of 5 strength with extension.  Also extension of the left great toe against resistance is 4 out of 5.  Straight leg raise positive on the left negative on the right.  She is able to walk on her tiptoes and heels.  She has full forward flexion lumbar spine and normal extension lumbar spine with minimal discomfort.  Specialty Comments:  No specialty comments available.  Imaging: XR Lumbar Spine 2-3 Views Result Date: 03/12/2024 Lumbar spine 2 views: Slight scoliosis.  No acute fractures acute injuries.  Degenerative disc disease at L3-4 and L4-5.  Anterior vertebral spurring throughout.  No spondylolisthesis.  Loss of lordotic curvature.    PMFS History: Patient Active Problem List   Diagnosis Date Noted   Anxiety 05/24/2023   Gait abnormality 02/20/2023   Dizziness 02/20/2023   PVC's (premature ventricular contractions)  01/24/2023   Syncope and collapse 01/24/2023   Paresthesia 12/08/2021   Non-ST elevation (NSTEMI) myocardial infarction (HCC) 05/13/2019   Sinus bradycardia 06/15/2018   Ischemic cardiomyopathy 06/15/2018   NSTEMI (non-ST elevated myocardial infarction) (HCC) 06/13/2018   GERD (gastroesophageal reflux disease) 07/10/2016   Hx of colonic polyp 02/25/2016   Antiplatelet or antithrombotic long-term use 02/25/2016   Nodule of right lung 10/13/2012   History of non-ST elevation myocardial infarction (NSTEMI) 08/14/2012   Hepatic steatosis 12/03/2011   Paroxysmal atrial fibrillation (HCC) 12/22/2010   CAD (coronary artery disease) 12/22/2010   INSOMNIA 04/13/2010   Hyperlipidemia 03/19/2010   Essential hypertension 03/19/2010   Obstructive sleep apnea 03/17/2010   Past Medical History:  Diagnosis Date   Arthritis    CAD (coronary artery disease)    a. prior stenting history. b. NSTEMI s/p DES to prox LAD with EF 45% by cath 08/14/12. c. Mild trop elevation shortly after cath ?mild plaque embolization - patent stent on relook. // d. Myoview  8/18: EF 60, normal perfusion; Low Risk. e. NSTEMI 06/2018- patent prior LAD stent, 80% OM1 s/p DES, normal LVEDP, EF 45-50%, moderate residual disease treated medically.   Fatty infiltration of liver    GERD (gastroesophageal reflux disease)    Hyperlipidemia    Hypertension    Hypokalemia    Hypotension    Myocardial infarction (HCC) 2014   OSA on CPAP    Osteoporosis    PAF (paroxysmal atrial fibrillation) (HCC)    On rythmol  previously   Pulmonary nodule    a. 8mm subpleural nodular density CT 08/2012, instructed to f/u pulm MD.   Sinus bradycardia    Tubular adenoma of colon 2007    Family History  Problem Relation Age of Onset   Heart disease Father    Alzheimer's disease Mother    Breast cancer Maternal Aunt    Colon cancer Neg Hx     Past Surgical History:  Procedure Laterality Date   ATRIAL FIBRILLATION ABLATION N/A 07/27/2020    Procedure: ATRIAL FIBRILLATION ABLATION;  Surgeon: Kelsie Agent, MD;  Location: [facility] INVASIVE CV LAB;  Service: Cardiovascular;  Laterality: N/A;   COLONOSCOPY  03/07/2016   COLONOSCOPY WITH PROPOFOL   07/04/2021   CORONARY ANGIOPLASTY WITH STENT PLACEMENT  08/14/2012   LAD  Dr Ozell Fell   CORONARY STENT INTERVENTION N/A 06/14/2018   Procedure: CORONARY STENT INTERVENTION;  Surgeon: Dann Candyce RAMAN, MD;  Location: [facility] INVASIVE CV LAB;  Service: Cardiovascular;  Laterality: N/A;   CORONARY STENT PLACEMENT  2000   hair restoration  02/2017   LEFT HEART CATH AND  CORONARY ANGIOGRAPHY N/A 06/14/2018   Procedure: LEFT HEART CATH AND CORONARY ANGIOGRAPHY;  Surgeon: Dann Candyce RAMAN, MD;  Location: [facility] INVASIVE CV LAB;  Service: Cardiovascular;  Laterality: N/A;   LEFT HEART CATH AND CORONARY ANGIOGRAPHY N/A 05/14/2019   Procedure: LEFT HEART CATH AND CORONARY ANGIOGRAPHY;  Surgeon: Wonda Sharper, MD;  Location: [facility] INVASIVE CV LAB;  Service: Cardiovascular;  Laterality: N/A;   LEFT HEART CATH AND CORONARY ANGIOGRAPHY N/A 01/23/2023   Procedure: LEFT HEART CATH AND CORONARY ANGIOGRAPHY;  Surgeon: Dann Candyce RAMAN, MD;  Location: [facility] INVASIVE CV LAB;  Service: Cardiovascular;  Laterality: N/A;   LEFT HEART CATHETERIZATION WITH CORONARY ANGIOGRAM N/A 08/14/2012   Procedure: LEFT HEART CATHETERIZATION WITH CORONARY ANGIOGRAM;  Surgeon: Sharper Wonda, MD;  Location: [facility] CATH LAB;  Service: Cardiovascular;  Laterality: N/A;   LEFT HEART CATHETERIZATION WITH CORONARY ANGIOGRAM N/A 08/19/2012   Procedure: LEFT HEART CATHETERIZATION WITH CORONARY ANGIOGRAM;  Surgeon: Sharper Wonda, MD;  Location: [facility] CATH LAB;  Service: Cardiovascular;  Laterality: N/A;   ROTATOR CUFF REPAIR     TONSILLECTOMY     TOTAL ABDOMINAL HYSTERECTOMY     Social History   Occupational History   Occupation: Retired    Associate Professor: [employer]     Comment: works part time in [employer]  Tobacco Use   Smoking status:  Never   Smokeless tobacco: Never  Vaping Use   Vaping status: Never Used  Substance and Sexual Activity   Alcohol  use: Yes    Alcohol /week: 1.0 standard drink of alcohol     Types: 1 Glasses of wine per week    Comment: 2 drinks daily    Drug use: No   Sexual activity: Not Currently

## 2024-03-12 NOTE — Addendum Note (Signed)
 Addended by: PETER FRIEZE B on: 03/12/2024 11:08 AM   Modules accepted: Orders

## 2024-03-17 ENCOUNTER — Other Ambulatory Visit: Payer: Self-pay | Admitting: *Deleted

## 2024-03-17 DIAGNOSIS — I480 Paroxysmal atrial fibrillation: Secondary | ICD-10-CM

## 2024-03-17 MED ORDER — APIXABAN 5 MG PO TABS: ORAL_TABLET | ORAL | 1 refills | Status: AC

## 2024-03-17 NOTE — Telephone Encounter (Signed)
 Eliquis  5mg  refill request received. Patient is 78 years old, weight-56.1kg, Crea-0.82 on 11/21/23, Diagnosis-Afib, and last seen by Dr. Alveta on 11/26/23. Dose is appropriate based on dosing criteria. Will send in refill to requested pharmacy.

## 2024-03-20 ENCOUNTER — Ambulatory Visit: Admitting: Physical Therapy

## 2024-03-25 DIAGNOSIS — Z23 Encounter for immunization: Secondary | ICD-10-CM | POA: Diagnosis not present

## 2024-03-27 ENCOUNTER — Ambulatory Visit: Payer: Self-pay

## 2024-03-27 DIAGNOSIS — Z23 Encounter for immunization: Secondary | ICD-10-CM | POA: Diagnosis not present

## 2024-03-27 NOTE — Telephone Encounter (Signed)
 Noted

## 2024-03-27 NOTE — Telephone Encounter (Signed)
 Pt has an appt with Dr Levora 03/28/2024

## 2024-03-27 NOTE — Telephone Encounter (Signed)
 FYI Only or Action Required?: FYI only for provider.  Patient was last seen in primary care on 11/21/2023 by Mahlon Comer BRAVO, MD.  Called Nurse Triage reporting Urinary Frequency.  Symptoms began today.  Interventions attempted: Nothing.  Symptoms are: gradually worsening.  Triage Disposition: See Physician Within 24 Hours  Patient/caregiver understands and will follow disposition?: Yes     Copied from CRM [id number]. Topic: Clinical - Red Word Triage >> Mar 27, 2024 11:18 AM Mercedes MATSU wrote: Red Word that prompted transfer to Nurse Triage: Pa=tient called in saying she may have a really bad uti, burning while urinating and difficulty. Reason for Disposition  Age > 50 years  Answer Assessment - Initial Assessment Questions 1. SEVERITY: How bad is the pain?  (e.g., Scale 1-10; mild, moderate, or severe)     9/10  2. PATTERN: Is pain present every time you urinate or just sometimes?      Yes  3. ONSET: When did the painful urination start?      This [place]  4. FEVER: Do you have a fever? If Yes, ask: What is your temperature, how was it measured, and when did it start?     Unsure, unable to check temperature 5. PAST UTI: Have you had a urine infection before? If Yes, ask: When was the last time? and What happened that time?      Yes  6. CAUSE: What do you think is causing the painful urination?  (e.g., UTI, scratch, Herpes sore)     UTI  7. OTHER SYMPTOMS: Do you have any other symptoms? (e.g., blood in urine, flank pain, genital sores, urgency, vaginal discharge)     Chills  Protocols used: Urination Pain - Female-A-AH

## 2024-03-28 ENCOUNTER — Encounter: Payer: Self-pay | Admitting: Family Medicine

## 2024-03-28 ENCOUNTER — Ambulatory Visit: Admitting: Family Medicine

## 2024-03-28 VITALS — BP 98/60 | HR 60 | Temp 97.8°F | Resp 17 | Ht 61.0 in | Wt 121.2 lb

## 2024-03-28 DIAGNOSIS — N390 Urinary tract infection, site not specified: Secondary | ICD-10-CM

## 2024-03-28 DIAGNOSIS — R350 Frequency of micturition: Secondary | ICD-10-CM | POA: Diagnosis not present

## 2024-03-28 DIAGNOSIS — R319 Hematuria, unspecified: Secondary | ICD-10-CM | POA: Diagnosis not present

## 2024-03-28 LAB — POCT URINALYSIS DIPSTICK
Bilirubin, UA: 4
Blood, UA: 200
Glucose, UA: NEGATIVE
Ketones, UA: 5
Nitrite, UA: POSITIVE
Protein, UA: POSITIVE — AB
Spec Grav, UA: 1.02 (ref 1.010–1.025)
Urobilinogen, UA: 4 U/dL — AB
pH, UA: 6 (ref 5.0–8.0)

## 2024-03-28 MED ORDER — CEPHALEXIN 500 MG PO CAPS: 500.0000 mg | ORAL_CAPSULE | Freq: Two times a day (BID) | ORAL | 0 refills | Status: DC

## 2024-03-28 NOTE — Patient Instructions (Signed)
 Based on your symptoms and testing today I do think you have a urinary tract infection.  Although no blood has been seen, there was a sign of possible blood in the urine today, so I did place an order for a repeat urine test in 1 month to make sure that clears.  Start antibiotic cephalexin  twice per day for the next 5 days.  I suspect your symptoms will improve very quickly but you can take the Azo over-the-counter temporary as needed as well as drinking plenty of fluids.  See other information below.  If any concerns on the urine culture I will let you know.  I hope you feel better soon and thank you for coming in today.  Urinary Tract Infection, Female A urinary tract infection (UTI) is an infection in your urinary tract. The urinary tract is made up of organs that make, store, and get rid of pee (urine) in your body. These organs include: The kidneys. The ureters. The bladder. The urethra. What are the causes? Most UTIs are caused by germs called bacteria. They may be in or near your genitals. These germs grow and cause swelling in your urinary tract. What increases the risk? You're more likely to get a UTI if: You're a female. The urethra is shorter in females than in males. You have a soft tube called a catheter that drains your pee. You can't control when you pee or poop. You have trouble peeing because of: A kidney stone. A urinary blockage. A nerve condition that affects your bladder. Not getting enough to drink. You're sexually active. You use a birth control inside your vagina, like spermicide. You're pregnant. You have low levels of the hormone estrogen in your body. You're an older adult. You're also more likely to get a UTI if you have other health problems. These may include: Diabetes. A weak immune system. Your immune system is your body's defense system. Sickle cell disease. Injury of the spine. What are the signs or symptoms? Symptoms may include: Needing to pee right  away. Peeing small amounts often. Pain or burning when you pee. Blood in your pee. Pee that smells bad or odd. Pain in your belly or lower back. You may also: Feel confused. This may be the first symptom in older adults. Vomit. Not feel hungry. Feel tired or easily annoyed. Have a fever or chills. How is this diagnosed? A UTI is diagnosed based on your medical history and an exam. You may also have other tests. These may include: Pee tests. Blood tests. Tests for sexually transmitted infections (STIs). If you've had more than one UTI, you may need to have imaging studies done to find out why you keep getting them. How is this treated? A UTI can be treated by: Taking antibiotics or other medicines. Drinking enough fluid to keep your pee pale yellow. In rare cases, a UTI can cause a very bad condition called sepsis. Sepsis may be treated in the hospital. Follow these instructions at home: Medicines Take your medicines only as told by your health care provider. If you were given antibiotics, take them as told by your provider. Do not stop taking them even if you start to feel better. General instructions Make sure you: Pee often and fully. Do not hold your pee for a long time. Wipe from front to back after you pee or poop. Use each tissue only once when you wipe. Pee after you have sex. Do not douche or use sprays or powders in your  genital area. Contact a health care provider if: Your symptoms don't get better after 1-2 days of taking antibiotics. Your symptoms go away and then come back. You have a fever or chills. You vomit or feel like you may vomit. Get help right away if: You have very bad pain in your back or lower belly. You faint. This information is not intended to replace advice given to you by your health care provider. Make sure you discuss any questions you have with your health care provider. Document Revised: 05/02/2023 Document Reviewed: 08/25/2022 Elsevier  Patient Education  2025 [employer].

## 2024-03-28 NOTE — Progress Notes (Signed)
 Subjective:  Patient ID: Shannon Cummings, female    DOB: [birth year]  Age: 78 y.o. MRN: [id number]  CC:  Chief Complaint  Patient presents with   Urinary Tract Infection    Sx started yesterday. Took AZO. Burning. Pain. Going to the restroom often    HPI Shannon Cummings presents for acute visit for above, PCP is Dr. Mahlon.  Dysuria Initial symptoms started yesterday morning. Burning with urination, no hematuria.  Frequent urination yesterday and through night. Slight chills with soreness, no fever. No back/flank pain.   Tx: Azo overnight - once - helped with symptoms.  Increased fluids.   Hx of UTI. Not recent.   Has been on prednisone  for back pain - finished this am.  On probiotic since this am.   History Patient Active Problem List   Diagnosis Date Noted   Anxiety 05/24/2023   Gait abnormality 02/20/2023   Dizziness 02/20/2023   PVC's (premature ventricular contractions) 01/24/2023   Syncope and collapse 01/24/2023   Paresthesia 12/08/2021   Non-ST elevation (NSTEMI) myocardial infarction (HCC) 05/13/2019   Sinus bradycardia 06/15/2018   Ischemic cardiomyopathy 06/15/2018   NSTEMI (non-ST elevated myocardial infarction) (HCC) 06/13/2018   GERD (gastroesophageal reflux disease) 07/10/2016   Hx of colonic polyp 02/25/2016   Antiplatelet or antithrombotic long-term use 02/25/2016   Nodule of right lung 10/13/2012   History of non-ST elevation myocardial infarction (NSTEMI) 08/14/2012   Hepatic steatosis 12/03/2011   Paroxysmal atrial fibrillation (HCC) 12/22/2010   CAD (coronary artery disease) 12/22/2010   INSOMNIA 04/13/2010   Hyperlipidemia 03/19/2010   Essential hypertension 03/19/2010   Obstructive sleep apnea 03/17/2010   Past Medical History:  Diagnosis Date   Anxiety    Arthritis    CAD (coronary artery disease)    a. prior stenting history. b. NSTEMI s/p DES to prox LAD with EF 45% by cath 08/14/12. c. Mild trop elevation shortly after cath ?mild  plaque embolization - patent stent on relook. // d. Myoview  8/18: EF 60, normal perfusion; Low Risk. e. NSTEMI 06/2018- patent prior LAD stent, 80% OM1 s/p DES, normal LVEDP, EF 45-50%, moderate residual disease treated medically.   Fatty infiltration of liver    GERD (gastroesophageal reflux disease)    Hyperlipidemia    Hypertension    Hypokalemia    Hypotension    Myocardial infarction (HCC) 2014   OSA on CPAP    Osteoporosis    PAF (paroxysmal atrial fibrillation) (HCC)    On rythmol  previously   Pulmonary nodule    a. 8mm subpleural nodular density CT 08/2012, instructed to f/u pulm MD.   Sinus bradycardia    Sleep apnea    Tubular adenoma of colon 2007   Past Surgical History:  Procedure Laterality Date   ATRIAL FIBRILLATION ABLATION N/A 07/27/2020   Procedure: ATRIAL FIBRILLATION ABLATION;  Surgeon: Kelsie Agent, MD;  Location: [facility] INVASIVE CV LAB;  Service: Cardiovascular;  Laterality: N/A;   COLONOSCOPY  03/07/2016   COLONOSCOPY WITH PROPOFOL   07/04/2021   CORONARY ANGIOPLASTY WITH STENT PLACEMENT  08/14/2012   LAD  Dr Ozell Fell   CORONARY STENT INTERVENTION N/A 06/14/2018   Procedure: CORONARY STENT INTERVENTION;  Surgeon: Dann Candyce RAMAN, MD;  Location: [facility] INVASIVE CV LAB;  Service: Cardiovascular;  Laterality: N/A;   CORONARY STENT PLACEMENT  2000   COSMETIC SURGERY     EYE SURGERY     hair restoration  02/2017   LEFT HEART CATH AND CORONARY ANGIOGRAPHY N/A 06/14/2018   Procedure:  LEFT HEART CATH AND CORONARY ANGIOGRAPHY;  Surgeon: Dann Candyce RAMAN, MD;  Location: [facility] INVASIVE CV LAB;  Service: Cardiovascular;  Laterality: N/A;   LEFT HEART CATH AND CORONARY ANGIOGRAPHY N/A 05/14/2019   Procedure: LEFT HEART CATH AND CORONARY ANGIOGRAPHY;  Surgeon: Wonda Sharper, MD;  Location: [facility] INVASIVE CV LAB;  Service: Cardiovascular;  Laterality: N/A;   LEFT HEART CATH AND CORONARY ANGIOGRAPHY N/A 01/23/2023   Procedure: LEFT HEART CATH AND CORONARY ANGIOGRAPHY;   Surgeon: Dann Candyce RAMAN, MD;  Location: [facility] INVASIVE CV LAB;  Service: Cardiovascular;  Laterality: N/A;   LEFT HEART CATHETERIZATION WITH CORONARY ANGIOGRAM N/A 08/14/2012   Procedure: LEFT HEART CATHETERIZATION WITH CORONARY ANGIOGRAM;  Surgeon: Sharper Wonda, MD;  Location: [facility] CATH LAB;  Service: Cardiovascular;  Laterality: N/A;   LEFT HEART CATHETERIZATION WITH CORONARY ANGIOGRAM N/A 08/19/2012   Procedure: LEFT HEART CATHETERIZATION WITH CORONARY ANGIOGRAM;  Surgeon: Sharper Wonda, MD;  Location: [facility] CATH LAB;  Service: Cardiovascular;  Laterality: N/A;   ROTATOR CUFF REPAIR     TONSILLECTOMY     TOTAL ABDOMINAL HYSTERECTOMY     Allergies  Allergen Reactions   Effexor  [Venlafaxine ] Other (See Comments)    Sedation    Flomax [Tamsulosin] Other (See Comments)    Hypotension Near syncope Weakness Vertigo   Macrobid [Nitrofurantoin] Diarrhea, Nausea Only and Other (See Comments)    Severe abdominal pain   Other Anxiety and Other (See Comments)    Intolerance to strong pain medications=  Make her feel anxious and feel like coming out of her skin   B-12 Compliance Injection [Cyanocobalamin ] Other (See Comments)    Headaches    Imdur  [Isosorbide  Nitrate] Other (See Comments)    Headaches    Statins Other (See Comments)    Restless legs Bad feeling  Symptoms occurred while taking Crestor  5mg  (once weekly), Lipitor 10mg  (twice weekly) and Simvastatin  (daily).   Zetia  [Ezetimibe ] Other (See Comments)    Myalgias   Prior to Admission medications   Medication Sig Start Date End Date Taking? Authorizing Provider  acetaminophen  (TYLENOL ) 500 MG tablet Take 500 mg by mouth daily as needed for moderate pain, fever or headache.   Yes [provider]  Alirocumab  (PRALUENT ) 75 MG/ML SOAJ ADMINISTER 1 ML UNDER THE SKIN EVERY 14 DAYS 09/25/23  Yes Nahser, Aleene PARAS, MD  apixaban  (ELIQUIS ) 5 MG TABS tablet TAKE 1 TABLET(5 MG) BY MOUTH TWICE DAILY 03/17/24  Yes Acharya, Gayatri A,  MD  Cholecalciferol  (VITAMIN D3) 50 MCG (2000 UT) capsule Take 1 capsule (2,000 Units total) by mouth daily. 01/24/23  Yes Darryle Currier L, PA-C  clopidogrel  (PLAVIX ) 75 MG tablet Take 1 tablet (75 mg total) by mouth daily. 08/13/23  Yes Nahser, Aleene PARAS, MD  doxycycline  (MONODOX ) 50 MG capsule Take 50 mg by mouth every Monday, Wednesday, and Friday.   Yes [provider]  fenofibrate  micronized (LOFIBRA) 134 MG capsule Take 1 capsule (134 mg total) by mouth daily before breakfast. 11/26/23  Yes Nahser, Aleene PARAS, MD  fluticasone  (FLONASE ) 50 MCG/ACT nasal spray Place 2 sprays into both nostrils daily as needed for allergies or rhinitis. 11/23/22  Yes Billy Philippe SAUNDERS, NP  losartan  (COZAAR ) 25 MG tablet Take 2 tablets (50 mg total) by mouth daily. 06/15/23  Yes Mahlon Comer BRAVO, MD  NEXLETOL  180 MG TABS TAKE 1 TABLET BY MOUTH DAILY 08/03/23  Yes Verlin Lonni BIRCH, MD  nitroGLYCERIN  (NITROSTAT ) 0.4 MG SL tablet Place 1 tablet (0.4 mg total) under the tongue every  5 (five) minutes as needed for chest pain. 05/22/22  Yes Nahser, Aleene PARAS, MD  pantoprazole  (PROTONIX ) 40 MG tablet Take 1 tablet (40 mg total) by mouth daily. 01/10/24  Yes Tabori, Katherine E, MD  potassium chloride  (KLOR-CON ) 10 MEQ tablet TAKE 1 TABLET(10 MEQ) BY MOUTH DAILY 10/05/23  Yes Nahser, Aleene PARAS, MD  Propylene Glycol (SYSTANE COMPLETE) 0.6 % SOLN Place 1 drop into both eyes 3 (three) times daily.   Yes [provider]  sertraline  (ZOLOFT ) 25 MG tablet Take 1 tablet (25 mg total) by mouth daily. 11/21/23  Yes Mahlon Comer BRAVO, MD  methylPREDNISolone  (MEDROL ) 4 MG tablet Day 1 take 3 tablets in the morning 3 tablets in the evening, day 2 take 3 tablets in the morning 2 tablets in the evening, day 3 take 2 tablets in the morning 2 tablets in the evening day 4 take 2 tablets in the morning 1 tablet in the evening, day 5 take 1 tablet in the morning 1 tablet in the evening, day 6 take 1 tablet in the  morning Patient not taking: Reported on 03/28/2024 03/12/24   Gretta Bertrum ORN, PA-C   Social History   Socioeconomic History   Marital status: Married    Spouse name: Not on file   Number of children: 2   Years of education: Not on file   Highest education level: Some college, no degree  Occupational History   Occupation: Retired    Associate Professor: [employer]     Comment: works part time in [employer]  Tobacco Use   Smoking status: Never   Smokeless tobacco: Never  Vaping Use   Vaping status: Never Used  Substance and Sexual Activity   Alcohol  use: Yes    Alcohol /week: 1.0 standard drink of alcohol     Types: 1 Glasses of wine per week    Comment: 2 drinks daily    Drug use: No   Sexual activity: Not Currently  Other Topics Concern   Not on file  Social History Narrative   Daily caffeine    Lives in [place]   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   Social Drivers of Health   Financial Resource Strain: Patient Declined (03/28/2024)   Overall Financial Resource Strain (CARDIA)    Difficulty of Paying Living Expenses: Patient declined  Food Insecurity: Patient Declined (03/28/2024)   Hunger Vital Sign    Worried About Running Out of Food in the Last Year: Patient declined    Ran Out of Food in the Last Year: Patient declined  Transportation Needs: Unknown (03/28/2024)   PRAPARE - Transportation    Lack of Transportation (Medical): No    Lack of Transportation (Non-Medical): Patient declined  Physical Activity: Insufficiently Active (03/28/2024)   Exercise Vital Sign    Days of Exercise per Week: 2 days    Minutes of Exercise per Session: 60 min  Stress: No Stress Concern Present (03/28/2024)   [employer] of Occupational Health - Occupational Stress Questionnaire    Feeling of Stress: Not at all  Social Connections: Unknown (03/28/2024)   Social Connection and Isolation Panel    Frequency of Communication with Friends and Family: More than three times a  week    Frequency of Social Gatherings with Friends and Family: Patient declined    Attends Religious Services: Patient declined    Database administrator or Organizations: Patient declined    Attends Banker Meetings: Not on file    Marital Status: Married  Intimate Partner Violence: Not At Risk (03/21/2023)   Humiliation, Afraid, Rape, and Kick questionnaire    Fear of Current or Ex-Partner: No    Emotionally Abused: No    Physically Abused: No    Sexually Abused: No    Review of Systems Per HPI.   Objective:   Vitals:   03/28/24 1140  BP: 98/60  Pulse: 60  Resp: 17  Temp: 97.8 F (36.6 C)  TempSrc: Temporal  SpO2: 97%  Weight: 121 lb 3.2 oz (55 kg)  Height: 5' 1 (1.549 m)     Physical Exam Vitals reviewed.  Constitutional:      Appearance: Normal appearance. She is well-developed.  HENT:     Head: Normocephalic and atraumatic.  Pulmonary:     Effort: Pulmonary effort is normal.  Abdominal:     General: There is no distension.     Palpations: Abdomen is soft.     Tenderness: There is no abdominal tenderness. There is no guarding or rebound.  Skin:    General: Skin is warm.  Neurological:     Mental Status: She is alert and oriented to person, place, and time.  Psychiatric:        Behavior: Behavior normal.      Results for orders placed or performed in visit on 03/28/24  POCT urinalysis dipstick   Collection Time: 03/28/24 11:44 AM  Result Value Ref Range   Color, UA Orange    Clarity, UA Cloudy    Glucose, UA Negative Negative   Bilirubin, UA 4    Ketones, UA 5    Spec Grav, UA 1.020 1.010 - 1.025   Blood, UA 200    pH, UA 6.0 5.0 - 8.0   Protein, UA Positive (A) Negative   Urobilinogen, UA 4.0 (A) 0.2 or 1.0 E.U./dL   Nitrite, UA Positive    Leukocytes, UA Large (3+) (A) Negative   Appearance     Odor       Assessment & Plan:  Shannon Cummings is a 78 y.o. female . Urinary frequency - Plan: POCT urinalysis dipstick,  Urine Culture, POCT urinalysis dipstick, cephALEXin  (KEFLEX ) 500 MG capsule  Urinary tract infection with hematuria, site unspecified - Plan: Urine Culture, POCT urinalysis dipstick, cephALEXin  (KEFLEX ) 500 MG capsule Approximately 2-day history of dysuria, frequency, urinary tract infection with hematuria based on labs above.  Suspected hemorrhagic cystitis.  No CVA tenderness, afebrile, reassuring exam.  Antibiotic allergies noted, start Keflex  500 mg twice daily, potential side effects of antibiotics discussed but anticipate this Shannon be tolerated well.  Fluids, Azo over-the-counter as needed, check culture, RTC precautions and handout given.  1 month nurse visit for repeat urinalysis to document clearing of hematuria.   Meds ordered this encounter  Medications   cephALEXin  (KEFLEX ) 500 MG capsule    Sig: Take 1 capsule (500 mg total) by mouth 2 (two) times daily.    Dispense:  10 capsule    Refill:  0   Patient Instructions  Based on your symptoms and testing today I do think you have a urinary tract infection.  Although no blood has been seen, there was a sign of possible blood in the urine today, so I did place an order for a repeat urine test in 1 month to make sure that clears.  Start antibiotic cephalexin  twice per day for the next 5 days.  I suspect your symptoms Shannon improve very quickly but you can take the Azo over-the-counter temporary as needed as  well as drinking plenty of fluids.  See other information below.  If any concerns on the urine culture I Shannon let you know.  I hope you feel better soon and thank you for coming in today.  Urinary Tract Infection, Female A urinary tract infection (UTI) is an infection in your urinary tract. The urinary tract is made up of organs that make, store, and get rid of pee (urine) in your body. These organs include: The kidneys. The ureters. The bladder. The urethra. What are the causes? Most UTIs are caused by germs called bacteria. They may  be in or near your genitals. These germs grow and cause swelling in your urinary tract. What increases the risk? You're more likely to get a UTI if: You're a female. The urethra is shorter in females than in males. You have a soft tube called a catheter that drains your pee. You can't control when you pee or poop. You have trouble peeing because of: A kidney stone. A urinary blockage. A nerve condition that affects your bladder. Not getting enough to drink. You're sexually active. You use a birth control inside your vagina, like spermicide. You're pregnant. You have low levels of the hormone estrogen in your body. You're an older adult. You're also more likely to get a UTI if you have other health problems. These may include: Diabetes. A weak immune system. Your immune system is your body's defense system. Sickle cell disease. Injury of the spine. What are the signs or symptoms? Symptoms may include: Needing to pee right away. Peeing small amounts often. Pain or burning when you pee. Blood in your pee. Pee that smells bad or odd. Pain in your belly or lower back. You may also: Feel confused. This may be the first symptom in older adults. Vomit. Not feel hungry. Feel tired or easily annoyed. Have a fever or chills. How is this diagnosed? A UTI is diagnosed based on your medical history and an exam. You may also have other tests. These may include: Pee tests. Blood tests. Tests for sexually transmitted infections (STIs). If you've had more than one UTI, you may need to have imaging studies done to find out why you keep getting them. How is this treated? A UTI can be treated by: Taking antibiotics or other medicines. Drinking enough fluid to keep your pee pale yellow. In rare cases, a UTI can cause a very bad condition called sepsis. Sepsis may be treated in the hospital. Follow these instructions at home: Medicines Take your medicines only as told by your health care  provider. If you were given antibiotics, take them as told by your provider. Do not stop taking them even if you start to feel better. General instructions Make sure you: Pee often and fully. Do not hold your pee for a long time. Wipe from front to back after you pee or poop. Use each tissue only once when you wipe. Pee after you have sex. Do not douche or use sprays or powders in your genital area. Contact a health care provider if: Your symptoms don't get better after 1-2 days of taking antibiotics. Your symptoms go away and then come back. You have a fever or chills. You vomit or feel like you may vomit. Get help right away if: You have very bad pain in your back or lower belly. You faint. This information is not intended to replace advice given to you by your health care provider. Make sure you discuss any questions you have with  your health care provider. Document Revised: 05/02/2023 Document Reviewed: 08/25/2022 Elsevier Patient Education  2025 [employer].    Signed,   Reyes Pines, MD [facility], [facility] 03/28/24 12:18 PM

## 2024-03-31 LAB — URINE CULTURE
MICRO NUMBER:: 17144727
SPECIMEN QUALITY:: ADEQUATE

## 2024-04-01 ENCOUNTER — Ambulatory Visit: Payer: Self-pay | Admitting: Family Medicine

## 2024-04-04 ENCOUNTER — Telehealth: Payer: Self-pay | Admitting: Family Medicine

## 2024-04-04 DIAGNOSIS — N390 Urinary tract infection, site not specified: Secondary | ICD-10-CM

## 2024-04-04 DIAGNOSIS — R350 Frequency of micturition: Secondary | ICD-10-CM

## 2024-04-04 MED ORDER — CEPHALEXIN 500 MG PO CAPS: 500.0000 mg | ORAL_CAPSULE | Freq: Two times a day (BID) | ORAL | 0 refills | Status: AC

## 2024-04-04 NOTE — Telephone Encounter (Signed)
 Noted.  I sent a refill of the Keflex  to her pharmacy.  Can try restarting medication for an additional 5-day course.  If symptoms do not improve quickly with that treatment or any persistent symptoms after treatment, needs office visit.  If any new abdominal pain, fever, nausea vomiting back pain or other new symptoms should be evaluated with any provider in the next day or 2.

## 2024-04-04 NOTE — Telephone Encounter (Unsigned)
 Copied from CRM #[id number]. Topic: Clinical - Refused Triage >> Apr 04, 2024  9:49 AM Shannon Cummings wrote: Patient/caller voiced complaints of recurring UTI, patient stated she was seen last week by Dr. Levora for this issue and the symptoms started back up this morning. Patient stated she is having blood in her urine and wants to start a medication before the weekend. Declined transfer to triage.

## 2024-04-04 NOTE — Telephone Encounter (Signed)
 Patient verbalized understanding

## 2024-04-07 ENCOUNTER — Encounter: Payer: Self-pay | Admitting: Radiology

## 2024-04-23 ENCOUNTER — Ambulatory Visit: Admitting: Orthopaedic Surgery

## 2024-04-28 ENCOUNTER — Ambulatory Visit (INDEPENDENT_AMBULATORY_CARE_PROVIDER_SITE_OTHER)

## 2024-04-28 DIAGNOSIS — Z87448 Personal history of other diseases of urinary system: Secondary | ICD-10-CM | POA: Diagnosis not present

## 2024-04-28 LAB — POCT URINALYSIS DIPSTICK
Bilirubin, UA: NEGATIVE
Blood, UA: NEGATIVE
Glucose, UA: NEGATIVE
Ketones, UA: NEGATIVE
Nitrite, UA: NEGATIVE
Protein, UA: NEGATIVE
Spec Grav, UA: 1.02 (ref 1.010–1.025)
Urobilinogen, UA: 0.2 U/dL
pH, UA: 6 (ref 5.0–8.0)

## 2024-04-28 NOTE — Progress Notes (Signed)
 Shannon Cummings is a 78 y.o. female presents in office today for a nurse visit for Urinalysis recheck.   Cesiah Westley R Minami Arriaga

## 2024-05-04 ENCOUNTER — Emergency Department (HOSPITAL_BASED_OUTPATIENT_CLINIC_OR_DEPARTMENT_OTHER)

## 2024-05-04 ENCOUNTER — Emergency Department (HOSPITAL_BASED_OUTPATIENT_CLINIC_OR_DEPARTMENT_OTHER)
Admission: EM | Admit: 2024-05-04 | Discharge: 2024-05-04 | Disposition: A | Discharge status: Home / Self Care | Attending: Emergency Medicine | Admitting: Emergency Medicine

## 2024-05-04 DIAGNOSIS — S60031A Contusion of right middle finger without damage to nail, initial encounter: Secondary | ICD-10-CM | POA: Diagnosis not present

## 2024-05-04 DIAGNOSIS — S0990XA Unspecified injury of head, initial encounter: Secondary | ICD-10-CM | POA: Insufficient documentation

## 2024-05-04 DIAGNOSIS — M25512 Pain in left shoulder: Secondary | ICD-10-CM | POA: Diagnosis not present

## 2024-05-04 DIAGNOSIS — Z7901 Long term (current) use of anticoagulants: Secondary | ICD-10-CM | POA: Diagnosis not present

## 2024-05-04 DIAGNOSIS — I672 Cerebral atherosclerosis: Secondary | ICD-10-CM | POA: Diagnosis not present

## 2024-05-04 DIAGNOSIS — W01198A Fall on same level from slipping, tripping and stumbling with subsequent striking against other object, initial encounter: Secondary | ICD-10-CM | POA: Diagnosis not present

## 2024-05-04 DIAGNOSIS — S6000XA Contusion of unspecified finger without damage to nail, initial encounter: Secondary | ICD-10-CM

## 2024-05-04 DIAGNOSIS — S60221A Contusion of right hand, initial encounter: Secondary | ICD-10-CM | POA: Diagnosis not present

## 2024-05-04 DIAGNOSIS — S6990XA Unspecified injury of unspecified wrist, hand and finger(s), initial encounter: Secondary | ICD-10-CM | POA: Diagnosis not present

## 2024-05-04 DIAGNOSIS — M1810 Unilateral primary osteoarthritis of first carpometacarpal joint, unspecified hand: Secondary | ICD-10-CM | POA: Diagnosis not present

## 2024-05-04 NOTE — Discharge Instructions (Signed)
 The CT of your head did not show any signs of bleeding.  Your hand x-ray was negative.  Use Tylenol  as needed for pain

## 2024-05-04 NOTE — ED Provider Notes (Signed)
 [place] EMERGENCY DEPARTMENT AT [facility] Provider Note   CSN: [id number] Arrival date & time: 05/04/24  2141     Patient presents with: Fall (eliquis )   Shannon Cummings is a 78 y.o. female.   78 year old who presents had mechanical fall just prior to arrival.  Patient states she lost her balance and fell onto her right shoulder.  Did hit her head but did not have any loss of consciousness.  She is on Eliquis  as well as Plavix .  Complains of mild left shoulder pain but denies any decreased range of motion.  Does not have a headache.  Does not have any neck pain.  Does have some pain to her right hand from the fall.  Denies any wrist or elbow discomfort.  No pain from below the waist.  She has not had any nausea or vomiting or confusion.       Prior to Admission medications   Medication Sig Start Date End Date Taking? Authorizing Provider  acetaminophen  (TYLENOL ) 500 MG tablet Take 500 mg by mouth daily as needed for moderate pain, fever or headache.    [provider]  Alirocumab  (PRALUENT ) 75 MG/ML SOAJ ADMINISTER 1 ML UNDER THE SKIN EVERY 14 DAYS 09/25/23   Nahser, Aleene PARAS, MD  apixaban  (ELIQUIS ) 5 MG TABS tablet TAKE 1 TABLET(5 MG) BY MOUTH TWICE DAILY 03/17/24   Acharya, Gayatri A, MD  cephALEXin  (KEFLEX ) 500 MG capsule Take 1 capsule (500 mg total) by mouth 2 (two) times daily. 04/04/24   Levora Reyes SAUNDERS, MD  Cholecalciferol  (VITAMIN D3) 50 MCG (2000 UT) capsule Take 1 capsule (2,000 Units total) by mouth daily. 01/24/23   Darryle Thom LITTIE, PA-C  clopidogrel  (PLAVIX ) 75 MG tablet Take 1 tablet (75 mg total) by mouth daily. 08/13/23   Nahser, Aleene PARAS, MD  doxycycline  (MONODOX ) 50 MG capsule Take 50 mg by mouth every Monday, Wednesday, and Friday.    [provider]  fenofibrate  micronized (LOFIBRA) 134 MG capsule Take 1 capsule (134 mg total) by mouth daily before breakfast. 11/26/23   Nahser, Aleene PARAS, MD  fluticasone  (FLONASE ) 50 MCG/ACT nasal  spray Place 2 sprays into both nostrils daily as needed for allergies or rhinitis. 11/23/22   Billy Philippe SAUNDERS, NP  losartan  (COZAAR ) 25 MG tablet Take 2 tablets (50 mg total) by mouth daily. 06/15/23   Tabori, Katherine E, MD  methylPREDNISolone  (MEDROL ) 4 MG tablet Day 1 take 3 tablets in the morning 3 tablets in the evening, day 2 take 3 tablets in the morning 2 tablets in the evening, day 3 take 2 tablets in the morning 2 tablets in the evening day 4 take 2 tablets in the morning 1 tablet in the evening, day 5 take 1 tablet in the morning 1 tablet in the evening, day 6 take 1 tablet in the morning Patient not taking: Reported on 03/28/2024 03/12/24   Gretta Bertrum ORN, PA-C  NEXLETOL  180 MG TABS TAKE 1 TABLET BY MOUTH DAILY 08/03/23   Verlin Lonni BIRCH, MD  nitroGLYCERIN  (NITROSTAT ) 0.4 MG SL tablet Place 1 tablet (0.4 mg total) under the tongue every 5 (five) minutes as needed for chest pain. 05/22/22   Nahser, Aleene PARAS, MD  pantoprazole  (PROTONIX ) 40 MG tablet Take 1 tablet (40 mg total) by mouth daily. 01/10/24   Tabori, Katherine E, MD  potassium chloride  (KLOR-CON ) 10 MEQ tablet TAKE 1 TABLET(10 MEQ) BY MOUTH DAILY 10/05/23   Nahser, Aleene PARAS, MD  Propylene Glycol (SYSTANE COMPLETE)  0.6 % SOLN Place 1 drop into both eyes 3 (three) times daily.    [provider]  sertraline  (ZOLOFT ) 25 MG tablet Take 1 tablet (25 mg total) by mouth daily. 11/21/23   Tabori, Katherine E, MD    Allergies: Effexor  [venlafaxine ], Flomax [tamsulosin], Macrobid [nitrofurantoin], Other, B-12 compliance injection [cyanocobalamin ], Imdur  [isosorbide  nitrate], Statins, and Zetia  [ezetimibe ]    Review of Systems  All other systems reviewed and are negative.   Updated Vital Signs BP (!) 196/101   Pulse 66   Temp 97.6 F (36.4 C)   Resp 17   SpO2 99%   Physical Exam Vitals and nursing note reviewed.  Constitutional:      General: She is not in acute distress.    Appearance: Normal appearance. She  is well-developed. She is not toxic-appearing.  HENT:     Head: Normocephalic and atraumatic.     Comments: No visible bruising noted.  No pain along the cervical spine Eyes:     General: Lids are normal.     Conjunctiva/sclera: Conjunctivae normal.     Pupils: Pupils are equal, round, and reactive to light.  Neck:     Thyroid : No thyroid  mass.     Trachea: No tracheal deviation.  Cardiovascular:     Rate and Rhythm: Normal rate and regular rhythm.     Heart sounds: Normal heart sounds. No murmur heard.    No gallop.  Pulmonary:     Effort: Pulmonary effort is normal. No respiratory distress.     Breath sounds: Normal breath sounds. No stridor. No decreased breath sounds, wheezing, rhonchi or rales.  Abdominal:     General: There is no distension.     Palpations: Abdomen is soft.     Tenderness: There is no abdominal tenderness. There is no rebound.  Musculoskeletal:        General: No tenderness. Normal range of motion.     Cervical back: Normal range of motion and neck supple.     Comments: Bruising noted at right middle finger.  No decreased range of motion  Skin:    General: Skin is warm and dry.     Findings: No abrasion or rash.  Neurological:     General: No focal deficit present.     Mental Status: She is alert and oriented to person, place, and time. Mental status is at baseline.     GCS: GCS eye subscore is 4. GCS verbal subscore is 5. GCS motor subscore is 6.     Cranial Nerves: No cranial nerve deficit.     Sensory: No sensory deficit.     Motor: Motor function is intact.  Psychiatric:        Attention and Perception: Attention normal.        Speech: Speech normal.        Behavior: Behavior normal.     (all labs ordered are listed, but only abnormal results are displayed) Labs Reviewed - No data to display  EKG: None  Radiology: No results found.   Procedures   Medications Ordered in the ED - No data to display                                   Medical Decision Making Amount and/or Complexity of Data Reviewed Radiology: ordered.   Right hand x-ray negative for fracture.  Head CT negative as well 2.  Will discharge home  Final diagnoses:  None    ED Discharge Orders     None          Dasie Faden, MD 05/04/24 2255

## 2024-05-04 NOTE — ED Triage Notes (Signed)
 Tripped walking in slippers. Fell. Struck head-left side-wooden end table. Reports HA, neck pain, left shoulder pain, bruising to right middle finger. No LOC. Eliquis  and plavix .

## 2024-05-13 ENCOUNTER — Encounter: Payer: Self-pay | Admitting: *Deleted

## 2024-05-15 ENCOUNTER — Ambulatory Visit: Attending: Internal Medicine | Admitting: Internal Medicine

## 2024-05-15 ENCOUNTER — Encounter: Payer: Self-pay | Admitting: Internal Medicine

## 2024-05-15 VITALS — BP 163/86 | HR 62 | Ht 60.0 in | Wt 120.0 lb

## 2024-05-15 DIAGNOSIS — I10 Essential (primary) hypertension: Secondary | ICD-10-CM | POA: Diagnosis not present

## 2024-05-15 DIAGNOSIS — I471 Supraventricular tachycardia, unspecified: Secondary | ICD-10-CM | POA: Diagnosis present

## 2024-05-15 DIAGNOSIS — E7800 Pure hypercholesterolemia, unspecified: Secondary | ICD-10-CM | POA: Diagnosis not present

## 2024-05-15 DIAGNOSIS — I214 Non-ST elevation (NSTEMI) myocardial infarction: Secondary | ICD-10-CM | POA: Insufficient documentation

## 2024-05-15 DIAGNOSIS — R002 Palpitations: Secondary | ICD-10-CM | POA: Diagnosis not present

## 2024-05-15 DIAGNOSIS — I480 Paroxysmal atrial fibrillation: Secondary | ICD-10-CM | POA: Insufficient documentation

## 2024-05-15 DIAGNOSIS — I2510 Atherosclerotic heart disease of native coronary artery without angina pectoris: Secondary | ICD-10-CM | POA: Diagnosis present

## 2024-05-15 NOTE — Progress Notes (Signed)
 Cardiology Office Note:  .   Date:  05/15/2024  ID:  Shannon Cummings, DOB 27-Jan-1946, MRN [id number] PCP: Mahlon Comer BRAVO, MD  [facility] Providers Cardiologist:  Soyla DELENA Merck, MD Electrophysiologist:  Lynwood Rakers, MD (Inactive)    History of Present Illness: .   Shannon Cummings is a 78 y.o. female.  Discussed the use of AI scribe software for clinical note transcription with the patient, who gave verbal consent to proceed.  History of Present Illness Shannon Cummings is a 78 year old female with coronary artery disease and intermittent atrial fibrillation who presents for follow-up care after her previous cardiologist retired.  She underwent stenting of her proximal left anterior descending artery on August 15, 2022. She is on Plavix  75 mg daily and Praluent  75 mg/mL, 1 mL subcutaneously every 14 days. She has had no recurrent angina.  She has intermittent atrial fibrillation and is on Eliquis  5 mg twice daily for secondary prevention. A recent cardiac monitor showed no atrial fibrillation but brief episodes of supraventricular tachycardia. She reports palpitations and uses propranolol  10 mg as needed.  She has hyperlipidemia with hereditary pattern per her report. She takes Nexletol  180 mg daily and Praluent  75 mg/mL, 1 mL subcutaneously every 14 days. Her most recent lipid panel showed LDL 76 mg/dL, triglycerides 812 mg/dL, and HDL 57 mg/dL. She briefly tried fenofibrate  134 mg but stopped it.  She reports labile blood pressures, which she attributes to pain from a recent fall affecting her hip and back. She takes losartan  25 mg as needed for hypertension. She also takes Zoloft  25 mg daily for OCD, started about a year ago, with a 20-pound weight loss and lower blood pressure since then.  She recently fell and hit her head. She takes Tylenol  for hip and back pain, typically 620 mg once or twice a day, and occasionally uses Tylenol  PM for sleep.    ROS: negative  except per HPI above.  Studies Reviewed: SABRA   EKG Interpretation Date/Time:  Thursday May 15 2024 10:39:57 EST Ventricular Rate:  62 PR Interval:  166 QRS Duration:  76 QT Interval:  418 QTC Calculation: 424 R Axis:   6  Text Interpretation: Normal sinus rhythm Normal ECG When compared with ECG of 07-Jun-2023 22:44, PREVIOUS ECG IS PRESENT Confirmed by Merck Soyla ([id number]) on 05/15/2024 10:56:27 AM    Results LABS Lipid Panel: LDL 76, triglycerides 187, HDL 57  DIAGNOSTIC Cardiac Monitor: No atrial fibrillation, brief episodes of SVT Echocardiogram: Normal ejection fraction, no significant valvular heart disease Cardiac Catheterization: 70% osteo first diagonal, 25% osteo first marginal, 50% right PDA, 50% distal circumflex, no culprit lesion for elevated troponin EKG: Normal Risk Assessment/Calculations:    CHA2DS2-VASc Score = 5   This indicates a 7.2% annual risk of stroke. The patient's score is based upon: CHF History: 0 HTN History: 1 Diabetes History: 0 Stroke History: 0 Vascular Disease History: 1 Age Score: 2 Gender Score: 1      Physical Exam:   VS:  BP (!) 163/86 (BP Location: Left Arm, Patient Position: Sitting)   Pulse 62   Ht 5' (1.524 m)   Wt 120 lb (54.4 kg)   SpO2 99%   BMI 23.44 kg/m    Wt Readings from Last 3 Encounters:  05/15/24 120 lb (54.4 kg)  03/28/24 121 lb 3.2 oz (55 kg)  11/26/23 123 lb 9.6 oz (56.1 kg)     Physical Exam GENERAL: Alert, cooperative, well developed, no  acute distress HEENT: Normocephalic, normal oropharynx, moist mucous membranes CHEST: Clear to auscultation bilaterally, no wheezes, rhonchi, or crackles CARDIOVASCULAR: Normal heart rate and rhythm, S1 and S2 normal without murmurs, neck vessels normal ABDOMEN: Soft, non-tender, non-distended, without organomegaly, normal bowel sounds EXTREMITIES: No cyanosis or edema NEUROLOGICAL: Cranial nerves grossly intact, moves all extremities without gross motor or  sensory deficit   ASSESSMENT AND PLAN: .    Assessment and Plan Assessment & Plan Coronary artery disease status post stenting and prior MINOCA/NSTEMI Coronary artery disease with prior stenting and MINOCA in 2024. No recurrent angina. Residual blockages present, not amenable to intervention. Therapy with clopidogrel  and anticoagulation with Eliquis  maintained.  - Continue clopidogrel  75 mg daily. - Continue Eliquis  5 mg BID. - Consulted with interventional cardiologist regarding dual therapy necessity.  Paroxysmal atrial fibrillation Managed with Eliquis  for secondary prevention. No recent episodes detected on cardiac monitor. - Continue Eliquis  5 mg BID.  Supraventricular tachycardia Intermittent episodes of SVT noted on cardiac monitor. Symptoms managed with propranolol  as needed. - Added propranolol  10 mg up to four times a day as needed to medication list.  Primary hypertension Hypertension managed with losartan . Blood pressure variability noted, possibly related to pain from recent fall. - Continue losartan  25 mg daily, patient takes as needed.  Hyperlipidemia Managed with Praluent  and Nexletol . Recent lipid panel showed elevated triglycerides. Fenofibrate  was previously added but not consistently taken. Plan to reassess lipid levels with fasting labs to determine need for fenofibrate . - Ordered fasting lipid panel and lipoprotein A. - Continue Praluent  75 mg/mL subcutaneously every 14 days. - Continue Nexletol  180 mg daily. - Will reassess need for fenofibrate  based on fasting lipid panel results.  I spent 40 minutes in the care of KAITLYNNE WENZ today including reviewing labs (11/21/23 lipids and bmet), reviewing studies (echo, LHC, monitor, carotid doppler), face to face time discussing treatment options (25 min), and documenting in the encounter.       Soyla Merck, MD, FACC

## 2024-05-15 NOTE — Patient Instructions (Signed)
 Medication Instructions:  No Changes *If you need a refill on your cardiac medications before your next appointment, please call your pharmacy*  Lab Work: FASTING (ok to have water and/or black coffee/tea , no cream no sugar) Lipid Panel and Lipoprotein A when you are able to in the next week. No appointment needed. You can go to any [facility]  Follow-Up: At [facility], you and your health needs are our priority.  As part of our continuing mission to provide you with exceptional heart care, our providers are all part of one team.  This team includes your primary Cardiologist (physician) and Advanced Practice Providers or APPs (Physician Assistants and Nurse Practitioners) who all work together to provide you with the care you need, when you need it.  Your next appointment:   1 year(s) (We will mail a reminder letter around Oct 2026; please call for a December 2026 appointment)  Provider:   Gayatri A Acharya, MD   Other Instructions Please call us  or send a MyChart message with any Cardiology related questions/concerns.  [phone].  Thank you!

## 2024-06-09 ENCOUNTER — Other Ambulatory Visit: Payer: Self-pay | Admitting: Family Medicine

## 2024-06-09 NOTE — Telephone Encounter (Signed)
 Copied from CRM [id number]. Topic: Clinical - Medication Refill >> Jun 09, 2024  2:53 PM Viola F wrote: Medication: 90 Day Supply - Sertraline  (ZOLOFT ) 25 MG tablet [[id number]]  Has the patient contacted their pharmacy? Yes (Agent: If no, request that the patient contact the pharmacy for the refill. If patient does not wish to contact the pharmacy document the reason why and proceed with request.) (Agent: If yes, when and what did the pharmacy advise?)  This is the patient's preferred pharmacy:   [id number] - [place], [place] - [id number] [place] [address] Phone: [phone] Fax: [phone]  Is this the correct pharmacy for this prescription? Yes If no, delete pharmacy and type the correct one.   Has the prescription been filled recently? Yes  Is the patient out of the medication? Yes, for 2 weeks   Has the patient been seen for an appointment in the last year OR does the patient have an upcoming appointment? Yes  Can we respond through MyChart? Yes  Agent: Please be advised that Rx refills may take up to 3 business days. We ask that you follow-up with your pharmacy.

## 2024-06-10 MED ORDER — SERTRALINE HCL 25 MG PO TABS: 25.0000 mg | ORAL_TABLET | Freq: Every day | ORAL | 3 refills | Status: AC

## 2024-06-18 ENCOUNTER — Other Ambulatory Visit: Payer: Self-pay | Admitting: Internal Medicine

## 2024-06-18 ENCOUNTER — Other Ambulatory Visit: Payer: Self-pay | Admitting: Family Medicine

## 2024-06-18 DIAGNOSIS — Z79899 Other long term (current) drug therapy: Secondary | ICD-10-CM

## 2024-06-18 DIAGNOSIS — I25119 Atherosclerotic heart disease of native coronary artery with unspecified angina pectoris: Secondary | ICD-10-CM

## 2024-06-18 DIAGNOSIS — I480 Paroxysmal atrial fibrillation: Secondary | ICD-10-CM

## 2024-06-18 DIAGNOSIS — E7800 Pure hypercholesterolemia, unspecified: Secondary | ICD-10-CM

## 2024-06-18 MED ORDER — CLOPIDOGREL BISULFATE 75 MG PO TABS: 75.0000 mg | ORAL_TABLET | Freq: Every day | ORAL | 3 refills | Status: AC

## 2024-06-23 ENCOUNTER — Telehealth (HOSPITAL_BASED_OUTPATIENT_CLINIC_OR_DEPARTMENT_OTHER): Payer: Self-pay | Admitting: *Deleted

## 2024-06-23 NOTE — Telephone Encounter (Signed)
"  ° °  Pre-operative Risk Assessment    Patient Name: Shannon Cummings  DOB: Dec 27, 1945 MRN: [id number]   Date of last office visit: 05/15/24 DR. ACHARYA  Date of next office visit: NONE   Request for Surgical Clearance    Procedure:  VITRECTOMY, POSTERIOR CAPSULAR REMOVAL VITREOUS OPACITIES, POSTERIOR CAPSULAR OPACIFICATION   Date of Surgery:  Clearance 08/25/24                                Surgeon:  DR. SELINDA SLOCUMB Surgeon's Group or Practice Name:  [facility] Phone number:  [phone] Fax number:  [phone]   Type of Clearance Requested:   - Medical  (MED LIST REFLECTS ELIQUIS  THOUGH NOTE THAT STATES PT REQUEST REMOVAL)   Type of Anesthesia:  Not Indicated   Additional requests/questions:    Bonney Niels Jest   06/23/2024, 2:28 PM   "

## 2024-06-23 NOTE — Telephone Encounter (Signed)
 Pharmacy please advise on holding Eliquis  prior to VITRECTOMY, POSTERIOR CAPSULAR REMOVAL VITREOUS OPACITIES, POSTERIOR CAPSULAR OPACIFICATION  scheduled for 08/25/2024. Thank you.

## 2024-06-23 NOTE — Telephone Encounter (Signed)
 Dr. Loni  You saw this patient on 05/15/2024. Per protocol we request that you comment on his cardiac risk to proceed with VITRECTOMY, POSTERIOR CAPSULAR REMOVAL VITREOUS OPACITIES, POSTERIOR CAPSULAR OPACIFICATION  since it has been less than 2 months since evaluated in the office. Please send your comment to P CV Pre-Op Pool.  Thank you, Lamarr Satterfield DNP, ANP, AACC.

## 2024-06-26 ENCOUNTER — Encounter: Payer: Self-pay | Admitting: Internal Medicine

## 2024-06-30 NOTE — Telephone Encounter (Signed)
 Patient with diagnosis of atrial fibrillation on Eliquis  for anticoagulation.    Procedure:  VITRECTOMY, POSTERIOR CAPSULAR REMOVAL VITREOUS OPACITIES, POSTERIOR CAPSULAR OPACIFICATION    Date of Surgery:  Clearance 08/25/24   CHA2DS2-VASc Score = 5   This indicates a 7.2% annual risk of stroke. The patient's score is based upon: CHF History: 0 HTN History: 1 Diabetes History: 0 Stroke History: 0 Vascular Disease History: 1 Age Score: 2 Gender Score: 1    CrCl 49 Platelet count 205  Patient has not had an Afib/aflutter ablation in the last 3 months, DCCV within the last 4 weeks or a watchman implanted in the last 45 days   Per office protocol, patient can hold Eliquis  for 3 days prior to procedure.   Patient will not need bridging with Lovenox  (enoxaparin ) around procedure.  **This guidance is not considered finalized until pre-operative APP has relayed final recommendations.**

## 2024-07-01 NOTE — Telephone Encounter (Signed)
"  ° °  Patient Name: Shannon Cummings  DOB: [birth year] MRN: [id number]  Primary Cardiologist: Soyla DELENA Merck, MD  Chart reviewed as part of pre-operative protocol coverage. Patient was recently seen by Dr. Merck on 05/15/2024. Per Dr. Merck: The patient is overall low risk for low risk procedure. No further cardiovascular testing is required prior to the procedure. If this level of risk is acceptable to the patient and surgical team, the patient should be considered optimized from a cardiovascular standpoint.  Per Pharmacy and office protocol,  patient can hold Eliquis  for 3 days prior to procedure.  Patient will not need bridging with Lovenox  (enoxaparin ) around procedure.  I will route this recommendation to the requesting party via Epic fax function and remove from pre-op pool.  Please call with questions.  Soledad Budreau E Malesha Suliman, PA-C 07/01/2024, 7:01 PM  "

## 2024-07-03 ENCOUNTER — Ambulatory Visit: Admitting: *Deleted

## 2024-07-03 VITALS — Ht 61.0 in | Wt 116.0 lb

## 2024-07-03 DIAGNOSIS — Z0000 Encounter for general adult medical examination without abnormal findings: Secondary | ICD-10-CM | POA: Diagnosis not present

## 2024-07-03 NOTE — Progress Notes (Signed)
 "  Chief Complaint  Patient presents with   Medicare Wellness     Subjective:   LATIQUA DALOIA is a 79 y.o. female who presents for a Medicare Annual Wellness Visit.  No voiced or noted concerns at this time   Visit info / Clinical Intake: Medicare Wellness Visit Type:: Subsequent Annual Wellness Visit Persons participating in visit and providing information:: patient Medicare Wellness Visit Mode:: Telephone If telephone:: video declined Since this visit was completed virtually, some vitals may be partially provided or unavailable. Missing vitals are due to the limitations of the virtual format.: Unable to obtain vitals - no equipment If Telephone or Video please confirm:: I connected with patient using audio/video enable telemedicine. I verified patient identity with two identifiers, discussed telehealth limitations, and patient agreed to proceed. Patient Location:: home Provider Location:: home Interpreter Needed?: No Pre-visit prep was completed: no AWV questionnaire completed by patient prior to visit?: no Living arrangements:: lives with spouse/significant other Patient's Overall Health Status Rating: good Typical amount of pain: none Does pain affect daily life?: no Are you currently prescribed opioids?: no  Dietary Habits and Nutritional Risks How many meals a day?: 2 Eats fruit and vegetables daily?: yes Most meals are obtained by: eating out; preparing own meals In the last 2 weeks, have you had any of the following?: none Diabetic:: no  Functional Status Activities of Daily Living (to include ambulation/medication): Independent Ambulation: Independent Medication Administration: Independent Home Management (perform basic housework or laundry): Independent Manage your own finances?: yes Primary transportation is: driving Concerns about vision?: no *vision screening is required for WTM* Concerns about hearing?: no  Fall Screening Falls in the past year?:  1 Number of falls in past year: 0 Was there an injury with Fall?: 0 Fall Risk Category Calculator: 1 Patient Fall Risk Level: Low Fall Risk  Fall Risk Patient at Risk for Falls Due to: No Fall Risks Fall risk Follow up: Falls evaluation completed; Education provided; Falls prevention discussed  Home and Transportation Safety: All rugs have non-skid backing?: yes All stairs or steps have railings?: yes Grab bars in the bathtub or shower?: (!) no Have non-skid surface in bathtub or shower?: yes Good home lighting?: yes Regular seat belt use?: yes Hospital stays in the last year:: no  Cognitive Assessment Difficulty concentrating, remembering, or making decisions? : no Will 6CIT or Mini Cog be Completed: yes What year is it?: 0 points What month is it?: 0 points Give patient an address phrase to remember (5 components): its very sunny outside today in January About what time is it?: 0 points Count backwards from 20 to 1: 0 points Say the months of the year in reverse: 0 points Repeat the address phrase from earlier: 0 points 6 CIT Score: 0 points  Advance Directives (For Healthcare) Does Patient Have a Medical Advance Directive?: Yes Type of Advance Directive: Healthcare Power of Attorney Copy of Healthcare Power of Attorney in Chart?: No - copy requested  Reviewed/Updated  Reviewed/Updated: Reviewed All (Medical, Surgical, Family, Medications, Allergies, Care Teams, Patient Goals); Surgical History; Family History; Medications; Allergies; Care Teams; Patient Goals; Medical History    Allergies (verified) Effexor  [venlafaxine ], Flomax [tamsulosin], Macrobid [nitrofurantoin], Other, B-12 compliance injection [cyanocobalamin ], Imdur  [isosorbide  nitrate], Statins, and Zetia  [ezetimibe ]   Current Medications (verified) Outpatient Encounter Medications as of 07/03/2024  Medication Sig   acetaminophen  (TYLENOL ) 500 MG tablet Take 500 mg by mouth daily as needed for moderate pain,  fever or headache.   Alirocumab  (PRALUENT ) 75  MG/ML SOAJ ADMINISTER 1 ML UNDER THE SKIN EVERY 14 DAYS   Cholecalciferol  (VITAMIN D3) 50 MCG (2000 UT) capsule Take 1 capsule (2,000 Units total) by mouth daily.   clopidogrel  (PLAVIX ) 75 MG tablet Take 1 tablet (75 mg total) by mouth daily.   doxycycline  (MONODOX ) 50 MG capsule Take 50 mg by mouth every Monday, Wednesday, and Friday.   fenofibrate  micronized (LOFIBRA) 134 MG capsule Take 1 capsule (134 mg total) by mouth daily before breakfast.   fluticasone  (FLONASE ) 50 MCG/ACT nasal spray Place 2 sprays into both nostrils daily as needed for allergies or rhinitis.   losartan  (COZAAR ) 25 MG tablet TAKE 2 TABLETS(50 MG) BY MOUTH DAILY   NEXLETOL  180 MG TABS TAKE 1 TABLET BY MOUTH DAILY   nitroGLYCERIN  (NITROSTAT ) 0.4 MG SL tablet Place 1 tablet (0.4 mg total) under the tongue every 5 (five) minutes as needed for chest pain.   pantoprazole  (PROTONIX ) 40 MG tablet Take 1 tablet (40 mg total) by mouth daily.   potassium chloride  (KLOR-CON ) 10 MEQ tablet TAKE 1 TABLET(10 MEQ) BY MOUTH DAILY   propranolol  (INDERAL ) 10 MG tablet Take 10 mg by mouth 4 (four) times daily as needed (Palpitations).   sertraline  (ZOLOFT ) 25 MG tablet Take 1 tablet (25 mg total) by mouth daily.   apixaban  (ELIQUIS ) 5 MG TABS tablet TAKE 1 TABLET(5 MG) BY MOUTH TWICE DAILY   cephALEXin  (KEFLEX ) 500 MG capsule Take 1 capsule (500 mg total) by mouth 2 (two) times daily.   methylPREDNISolone  (MEDROL ) 4 MG tablet Day 1 take 3 tablets in the morning 3 tablets in the evening, day 2 take 3 tablets in the morning 2 tablets in the evening, day 3 take 2 tablets in the morning 2 tablets in the evening day 4 take 2 tablets in the morning 1 tablet in the evening, day 5 take 1 tablet in the morning 1 tablet in the evening, day 6 take 1 tablet in the morning (Patient not taking: Reported on 03/28/2024)   Propylene Glycol (SYSTANE COMPLETE) 0.6 % SOLN Place 1 drop into both eyes 3 (three) times  daily.   No facility-administered encounter medications on file as of 07/03/2024.    History: Past Medical History:  Diagnosis Date   Anxiety    Arthritis    CAD (coronary artery disease)    a. prior stenting history. b. NSTEMI s/p DES to prox LAD with EF 45% by cath 08/14/12. c. Mild trop elevation shortly after cath ?mild plaque embolization - patent stent on relook. // d. Myoview  8/18: EF 60, normal perfusion; Low Risk. e. NSTEMI 06/2018- patent prior LAD stent, 80% OM1 s/p DES, normal LVEDP, EF 45-50%, moderate residual disease treated medically.   Fatty infiltration of liver    GERD (gastroesophageal reflux disease)    Hyperlipidemia    Hypertension    Hypokalemia    Hypotension    Myocardial infarction (HCC) 2014   OSA on CPAP    Osteoporosis    PAF (paroxysmal atrial fibrillation) (HCC)    On rythmol  previously   Pulmonary nodule    a. 8mm subpleural nodular density CT 08/2012, instructed to f/u pulm MD.   Sinus bradycardia    Sleep apnea    Tubular adenoma of colon 2007   Past Surgical History:  Procedure Laterality Date   ATRIAL FIBRILLATION ABLATION N/A 07/27/2020   Procedure: ATRIAL FIBRILLATION ABLATION;  Surgeon: Kelsie Agent, MD;  Location: [facility] INVASIVE CV LAB;  Service: Cardiovascular;  Laterality: N/A;   COLONOSCOPY  03/07/2016   COLONOSCOPY WITH PROPOFOL   07/04/2021   CORONARY ANGIOPLASTY WITH STENT PLACEMENT  08/14/2012   LAD  Dr Ozell Fell   CORONARY STENT INTERVENTION N/A 06/14/2018   Procedure: CORONARY STENT INTERVENTION;  Surgeon: Dann Candyce RAMAN, MD;  Location[facility] INVASIVE CV LAB;  Service: Cardiovascular;  Laterality: N/A;   CORONARY STENT PLACEMENT  2000   COSMETIC SURGERY     EYE SURGERY     hair restoration  02/2017   LEFT HEART CATH AND CORONARY ANGIOGRAPHY N/A 06/14/2018   Procedure: LEFT HEART CATH AND CORONARY ANGIOGRAPHY;  Surgeon: Dann Candyce RAMAN, MD;  Location: [facility] INVASIVE CV LAB;  Service: Cardiovascular;  Laterality: N/A;    LEFT HEART CATH AND CORONARY ANGIOGRAPHY N/A 05/14/2019   Procedure: LEFT HEART CATH AND CORONARY ANGIOGRAPHY;  Surgeon: Fell Ozell, MD;  Location: [facility] INVASIVE CV LAB;  Service: Cardiovascular;  Laterality: N/A;   LEFT HEART CATH AND CORONARY ANGIOGRAPHY N/A 01/23/2023   Procedure: LEFT HEART CATH AND CORONARY ANGIOGRAPHY;  Surgeon: Dann Candyce RAMAN, MD;  Location: [facility] INVASIVE CV LAB;  Service: Cardiovascular;  Laterality: N/A;   LEFT HEART CATHETERIZATION WITH CORONARY ANGIOGRAM N/A 08/14/2012   Procedure: LEFT HEART CATHETERIZATION WITH CORONARY ANGIOGRAM;  Surgeon: Ozell Fell, MD;  Location: [facility] CATH LAB;  Service: Cardiovascular;  Laterality: N/A;   LEFT HEART CATHETERIZATION WITH CORONARY ANGIOGRAM N/A 08/19/2012   Procedure: LEFT HEART CATHETERIZATION WITH CORONARY ANGIOGRAM;  Surgeon: Ozell Fell, MD;  Location: [facility] CATH LAB;  Service: Cardiovascular;  Laterality: N/A;   ROTATOR CUFF REPAIR     TONSILLECTOMY     TOTAL ABDOMINAL HYSTERECTOMY     Family History  Problem Relation Age of Onset   Heart disease Father    Alzheimer's disease Mother    Arthritis Mother    Breast cancer Maternal Aunt    Colon cancer Neg Hx    Social History   Occupational History   Occupation: Retired    Associate Professor: [employer]     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  Tobacco Use   Smoking status: Never   Smokeless tobacco: Never  Vaping Use   Vaping status: Never Used  Substance and Sexual Activity   Alcohol  use: Yes    Alcohol /week: 1.0 standard drink of alcohol     Types: 1 Glasses of wine per week    Comment: 2 drinks daily    Drug use: No   Sexual activity: Not Currently   Tobacco Counseling Counseling given: Not Answered  SDOH Screenings   Food Insecurity: Patient Declined (07/03/2024)  Housing: Unknown (07/03/2024)  Transportation Needs: Unknown (07/03/2024)  Utilities: Not At Risk (07/03/2024)  Alcohol  Screen: Low Risk (03/28/2024)  Depression (PHQ2-9): Low Risk  (07/03/2024)  Financial Resource Strain: Patient Declined (03/28/2024)  Physical Activity: Insufficiently Active (07/03/2024)  Social Connections: Moderately Isolated (07/03/2024)  Stress: No Stress Concern Present (07/03/2024)  Tobacco Use: Low Risk (07/03/2024)  Health Literacy: Adequate Health Literacy (07/03/2024)   See flowsheets for full screening details  Depression Screen PHQ 2 & 9 Depression Scale- Over the past 2 weeks, how often have you been bothered by any of the following problems? Little interest or pleasure in doing things: 0 Feeling down, depressed, or hopeless (PHQ Adolescent also includes...irritable): 0 PHQ-2 Total Score: 0 Trouble falling or staying asleep, or sleeping too much: 0 Feeling tired or having little energy: 0 Poor appetite or overeating (PHQ Adolescent also includes...weight loss): 0 Feeling bad about yourself - or that you are a failure  or have let yourself or your family down: 0 Trouble concentrating on things, such as reading the newspaper or watching television (PHQ Adolescent also includes...like school work): 0 Moving or speaking so slowly that other people could have noticed. Or the opposite - being so fidgety or restless that you have been moving around a lot more than usual: 0 Thoughts that you would be better off dead, or of hurting yourself in some way: 0 PHQ-9 Total Score: 0 If you checked off any problems, how difficult have these problems made it for you to do your work, take care of things at home, or get along with other people?: Not difficult at all     Goals Addressed             This Visit's Progress    Patient Stated       Maitain currrent lifestyle             Objective:    Today's Vitals   07/03/24 1145  Weight: 116 lb (52.6 kg)  Height: 5' 1 (1.549 m)   Body mass index is 21.92 kg/m.  Hearing/Vision screen Hearing Screening - Comments:: No trouble hearing Vision Screening - Comments:: [place] Up to  date Immunizations and Health Maintenance Health Maintenance  Topic Date Due   Hepatitis C Screening  Never done   DTaP/Tdap/Td (1 - Tdap) Never done   Bone Density Scan  Never done   Zoster Vaccines- Shingrix (2 of 2) 09/08/2020   Colonoscopy  07/04/2024   COVID-19 Vaccine (4 - 2025-26 season) 07/19/2024 (Originally 02/04/2024)   Medicare Annual Wellness (AWV)  07/03/2025   Pneumococcal Vaccine: 50+ Years  Completed   Influenza Vaccine  Completed   Meningococcal B Vaccine  Aged Out   Mammogram  Discontinued        Assessment/Plan:  This is a routine wellness examination for Tyleigh.  Patient Care Team: Mahlon Comer BRAVO, MD as PCP - General (Family Medicine) Kelsie Agent, MD (Inactive) as PCP - Electrophysiology (Cardiology) Loni Soyla LABOR, MD as PCP - Cardiology (Cardiology) Key, Hargis HERO, NP as Nurse Practitioner (Gynecology) annelle, grant (Dermatology) Tricia, Tawni CROME, MD as Referring Physician (Dermatology) Nahser, Aleene PARAS, MD (Inactive) as Consulting Physician (Cardiology) Gaston Hamilton, MD as Consulting Physician (Urology) Aneita Gwendlyn DASEN, MD (Inactive) as Consulting Physician (Gastroenterology)  I have personally reviewed and noted the following in the patients chart:   Medical and social history Use of alcohol , tobacco or illicit drugs  Current medications and supplements including opioid prescriptions. Functional ability and status Nutritional status Physical activity Advanced directives List of other physicians Hospitalizations, surgeries, and ER visits in previous 12 months Vitals Screenings to include cognitive, depression, and falls Referrals and appointments  No orders of the defined types were placed in this encounter.  In addition, I have reviewed and discussed with patient certain preventive protocols, quality metrics, and best practice recommendations. A written personalized care plan for preventive services as well as general  preventive health recommendations were provided to patient.   Mliss Graff, LPN   8/70/7973   Return in 1 year (on 07/03/2025).  After Visit Summary: (MyChart) Due to this being a telephonic visit, the after visit summary with patients personalized plan was offered to patient via MyChart   Nurse Notes:  "

## 2024-07-03 NOTE — Patient Instructions (Signed)
 Shannon Cummings,  Thank you for taking the time for your Medicare Wellness Visit. I appreciate your continued commitment to your health goals. Please review the care plan we discussed, and feel free to reach out if I can assist you further.  Please note that Annual Wellness Visits do not include a physical exam. Some assessments may be limited, especially if the visit was conducted virtually. If needed, we may recommend an in-person follow-up with your provider.  Ongoing Care Seeing your primary care provider every 3 to 6 months helps us  monitor your health and provide consistent, personalized care.  Referrals If a referral was made during today's visit and you haven't received any updates within two weeks, please contact the referred provider directly to check on the status.  Recommended Screenings:  Health Maintenance  Topic Date Due   Hepatitis C Screening  Never done   DTaP/Tdap/Td vaccine (1 - Tdap) Never done   Osteoporosis screening with Bone Density Scan  Never done   Zoster (Shingles) Vaccine (2 of 2) 09/08/2020   Colon Cancer Screening  07/04/2024   COVID-19 Vaccine (4 - 2025-26 season) 07/19/2024*   Medicare Annual Wellness Visit  07/03/2025   Pneumococcal Vaccine for age over 65  Completed   Flu Shot  Completed   Meningitis B Vaccine  Aged Out   Breast Cancer Screening  Discontinued  *Topic was postponed. The date shown is not the original due date.       07/03/2024   11:47 AM  Advanced Directives  Does Patient Have a Medical Advance Directive? Yes  Type of Advance Directive Healthcare Power of Attorney  Copy of Healthcare Power of Attorney in Chart? No - copy requested    Vision: Annual vision screenings are recommended for early detection of glaucoma, cataracts, and diabetic retinopathy. These exams can also reveal signs of chronic conditions such as diabetes and high blood pressure.  Dental: Annual dental screenings help detect early signs of oral cancer, gum  disease, and other conditions linked to overall health, including heart disease and diabetes.  Please see the attached documents for additional preventive care recommendations.           Shannon Cummings , Thank you for taking time to come for your Medicare Wellness Visit. I appreciate your ongoing commitment to your health goals. Please review the following plan we discussed and let me know if I can assist you in the future.   Screening recommendations/referrals: Colonoscopy:  Mammogram:  Bone Density:  Recommended yearly ophthalmology/optometry visit for glaucoma screening and checkup Recommended yearly dental visit for hygiene and checkup  Vaccinations: Influenza vaccine:  Pneumococcal vaccine:  Tdap vaccine:  Shingles vaccine:        Preventive Care 65 Years and Older, Female Preventive care refers to lifestyle choices and visits with your health care provider that can promote health and wellness. What does preventive care include? A yearly physical exam. This is also called an annual well check. Dental exams once or twice a year. Routine eye exams. Ask your health care provider how often you should have your eyes checked. Personal lifestyle choices, including: Daily care of your teeth and gums. Regular physical activity. Eating a healthy diet. Avoiding tobacco and drug use. Limiting alcohol  use. Practicing safe sex. Taking low-dose aspirin  every day. Taking vitamin and mineral supplements as recommended by your health care provider. What happens during an annual well check? The services and screenings done by your health care provider during your annual well check will depend  on your age, overall health, lifestyle risk factors, and family history of disease. Counseling  Your health care provider may ask you questions about your: Alcohol  use. Tobacco use. Drug use. Emotional well-being. Home and relationship well-being. Sexual activity. Eating habits. History of  falls. Memory and ability to understand (cognition). Work and work astronomer. Reproductive health. Screening  You may have the following tests or measurements: Height, weight, and BMI. Blood pressure. Lipid and cholesterol levels. These may be checked every 5 years, or more frequently if you are over 83 years old. Skin check. Lung cancer screening. You may have this screening every year starting at age 50 if you have a 30-pack-year history of smoking and currently smoke or have quit within the past 15 years. Fecal occult blood test (FOBT) of the stool. You may have this test every year starting at age 66. Flexible sigmoidoscopy or colonoscopy. You may have a sigmoidoscopy every 5 years or a colonoscopy every 10 years starting at age 44. Hepatitis C blood test. Hepatitis B blood test. Sexually transmitted disease (STD) testing. Diabetes screening. This is done by checking your blood sugar (glucose) after you have not eaten for a while (fasting). You may have this done every 1-3 years. Bone density scan. This is done to screen for osteoporosis. You may have this done starting at age 37. Mammogram. This may be done every 1-2 years. Talk to your health care provider about how often you should have regular mammograms. Talk with your health care provider about your test results, treatment options, and if necessary, the need for more tests. Vaccines  Your health care provider may recommend certain vaccines, such as: Influenza vaccine. This is recommended every year. Tetanus, diphtheria, and acellular pertussis (Tdap, Td) vaccine. You may need a Td booster every 10 years. Zoster vaccine. You may need this after age 66. Pneumococcal 13-valent conjugate (PCV13) vaccine. One dose is recommended after age 7. Pneumococcal polysaccharide (PPSV23) vaccine. One dose is recommended after age 52. Talk to your health care provider about which screenings and vaccines you need and how often you need  them. This information is not intended to replace advice given to you by your health care provider. Make sure you discuss any questions you have with your health care provider. Document Released: 06/18/2015 Document Revised: 02/09/2016 Document Reviewed: 03/23/2015 Elsevier Interactive Patient Education  2017 [employer].  Fall Prevention in the Home Falls can cause injuries. They can happen to people of all ages. There are many things you can do to make your home safe and to help prevent falls. What can I do on the outside of my home? Regularly fix the edges of walkways and driveways and fix any cracks. Remove anything that might make you trip as you walk through a door, such as a raised step or threshold. Trim any bushes or trees on the path to your home. Use bright outdoor lighting. Clear any walking paths of anything that might make someone trip, such as rocks or tools. Regularly check to see if handrails are loose or broken. Make sure that both sides of any steps have handrails. Any raised decks and porches should have guardrails on the edges. Have any leaves, snow, or ice cleared regularly. Use sand or salt on walking paths during winter. Clean up any spills in your garage right away. This includes oil or grease spills. What can I do in the bathroom? Use night lights. Install grab bars by the toilet and in the tub and shower. Do  not use towel bars as grab bars. Use non-skid mats or decals in the tub or shower. If you need to sit down in the shower, use a plastic, non-slip stool. Keep the floor dry. Clean up any water that spills on the floor as soon as it happens. Remove soap buildup in the tub or shower regularly. Attach bath mats securely with double-sided non-slip rug tape. Do not have throw rugs and other things on the floor that can make you trip. What can I do in the bedroom? Use night lights. Make sure that you have a light by your bed that is easy to reach. Do not use  any sheets or blankets that are too big for your bed. They should not hang down onto the floor. Have a firm chair that has side arms. You can use this for support while you get dressed. Do not have throw rugs and other things on the floor that can make you trip. What can I do in the kitchen? Clean up any spills right away. Avoid walking on wet floors. Keep items that you use a lot in easy-to-reach places. If you need to reach something above you, use a strong step stool that has a grab bar. Keep electrical cords out of the way. Do not use floor polish or wax that makes floors slippery. If you must use wax, use non-skid floor wax. Do not have throw rugs and other things on the floor that can make you trip. What can I do with my stairs? Do not leave any items on the stairs. Make sure that there are handrails on both sides of the stairs and use them. Fix handrails that are broken or loose. Make sure that handrails are as long as the stairways. Check any carpeting to make sure that it is firmly attached to the stairs. Fix any carpet that is loose or worn. Avoid having throw rugs at the top or bottom of the stairs. If you do have throw rugs, attach them to the floor with carpet tape. Make sure that you have a light switch at the top of the stairs and the bottom of the stairs. If you do not have them, ask someone to add them for you. What else can I do to help prevent falls? Wear shoes that: Do not have high heels. Have rubber bottoms. Are comfortable and fit you well. Are closed at the toe. Do not wear sandals. If you use a stepladder: Make sure that it is fully opened. Do not climb a closed stepladder. Make sure that both sides of the stepladder are locked into place. Ask someone to hold it for you, if possible. Clearly mark and make sure that you can see: Any grab bars or handrails. First and last steps. Where the edge of each step is. Use tools that help you move around (mobility aids)  if they are needed. These include: Canes. Walkers. Scooters. Crutches. Turn on the lights when you go into a dark area. Replace any light bulbs as soon as they burn out. Set up your furniture so you have a clear path. Avoid moving your furniture around. If any of your floors are uneven, fix them. If there are any pets around you, be aware of where they are. Review your medicines with your doctor. Some medicines can make you feel dizzy. This can increase your chance of falling. Ask your doctor what other things that you can do to help prevent falls. This information is not intended to replace  advice given to you by your health care provider. Make sure you discuss any questions you have with your health care provider. Document Released: 03/18/2009 Document Revised: 10/28/2015 Document Reviewed: 06/26/2014 Elsevier Interactive Patient Education  2017 [employer].

## 2024-07-03 NOTE — Addendum Note (Signed)
 Addended by: BRIEN SHARENE RAMAN on: 07/03/2024 03:29 PM   Modules accepted: Orders

## 2024-07-04 ENCOUNTER — Other Ambulatory Visit (INDEPENDENT_AMBULATORY_CARE_PROVIDER_SITE_OTHER)

## 2024-07-04 DIAGNOSIS — I2510 Atherosclerotic heart disease of native coronary artery without angina pectoris: Secondary | ICD-10-CM

## 2024-07-04 DIAGNOSIS — E7800 Pure hypercholesterolemia, unspecified: Secondary | ICD-10-CM

## 2024-07-09 LAB — LIPID PANEL
Chol/HDL Ratio: 2.8 ratio (ref 0.0–4.4)
Cholesterol, Total: 193 mg/dL (ref 100–199)
HDL: 69 mg/dL
LDL Chol Calc (NIH): 104 mg/dL — ABNORMAL HIGH (ref 0–99)
Triglycerides: 116 mg/dL (ref 0–149)
VLDL Cholesterol Cal: 20 mg/dL (ref 5–40)

## 2024-07-09 LAB — LIPOPROTEIN A (LPA): Lipoprotein (a): 84.4 nmol/L — AB
